# Patient Record
Sex: Female | Born: 1970 | Race: Black or African American | Hispanic: No | Marital: Single | Smoking: Never smoker
Health system: Southern US, Community
[De-identification: ages and names within clinical notes are randomized; demographics above are authoritative.]

## PROBLEM LIST (undated history)

## (undated) DIAGNOSIS — S069X9A Unspecified intracranial injury with loss of consciousness of unspecified duration, initial encounter: Secondary | ICD-10-CM

## (undated) DIAGNOSIS — G40909 Epilepsy, unspecified, not intractable, without status epilepticus: Secondary | ICD-10-CM

## (undated) DIAGNOSIS — I5043 Acute on chronic combined systolic (congestive) and diastolic (congestive) heart failure: Secondary | ICD-10-CM

## (undated) DIAGNOSIS — A419 Sepsis, unspecified organism: Secondary | ICD-10-CM

## (undated) DIAGNOSIS — E039 Hypothyroidism, unspecified: Secondary | ICD-10-CM

## (undated) DIAGNOSIS — S069XAA Unspecified intracranial injury with loss of consciousness status unknown, initial encounter: Secondary | ICD-10-CM

## (undated) DIAGNOSIS — J150 Pneumonia due to Klebsiella pneumoniae: Secondary | ICD-10-CM

## (undated) DIAGNOSIS — E119 Type 2 diabetes mellitus without complications: Secondary | ICD-10-CM

## (undated) DIAGNOSIS — J9621 Acute and chronic respiratory failure with hypoxia: Secondary | ICD-10-CM

## (undated) DIAGNOSIS — N170 Acute kidney failure with tubular necrosis: Secondary | ICD-10-CM

## (undated) DIAGNOSIS — J80 Acute respiratory distress syndrome: Secondary | ICD-10-CM

## (undated) DIAGNOSIS — Z87820 Personal history of traumatic brain injury: Secondary | ICD-10-CM

## (undated) DIAGNOSIS — Z930 Tracheostomy status: Secondary | ICD-10-CM

## (undated) DIAGNOSIS — R6520 Severe sepsis without septic shock: Secondary | ICD-10-CM

## (undated) DIAGNOSIS — J690 Pneumonitis due to inhalation of food and vomit: Secondary | ICD-10-CM

## (undated) HISTORY — PX: TRACHEOSTOMY: SUR1362

---

## 2011-01-26 ENCOUNTER — Ambulatory Visit (HOSPITAL_COMMUNITY): Payer: Self-pay | Admitting: Psychology

## 2011-02-01 ENCOUNTER — Ambulatory Visit (HOSPITAL_COMMUNITY): Payer: Self-pay | Admitting: Licensed Clinical Social Worker

## 2011-02-15 ENCOUNTER — Ambulatory Visit (HOSPITAL_COMMUNITY): Payer: Medicare Other | Admitting: Licensed Clinical Social Worker

## 2012-10-19 DIAGNOSIS — K315 Obstruction of duodenum: Secondary | ICD-10-CM | POA: Insufficient documentation

## 2012-10-19 DIAGNOSIS — R4182 Altered mental status, unspecified: Secondary | ICD-10-CM | POA: Insufficient documentation

## 2012-10-20 DIAGNOSIS — D62 Acute posthemorrhagic anemia: Secondary | ICD-10-CM | POA: Insufficient documentation

## 2012-10-20 DIAGNOSIS — D72819 Decreased white blood cell count, unspecified: Secondary | ICD-10-CM | POA: Insufficient documentation

## 2012-10-23 DIAGNOSIS — I469 Cardiac arrest, cause unspecified: Secondary | ICD-10-CM | POA: Insufficient documentation

## 2012-10-23 DIAGNOSIS — R0603 Acute respiratory distress: Secondary | ICD-10-CM | POA: Insufficient documentation

## 2018-01-18 DIAGNOSIS — R339 Retention of urine, unspecified: Secondary | ICD-10-CM | POA: Insufficient documentation

## 2018-01-18 DIAGNOSIS — R310 Gross hematuria: Secondary | ICD-10-CM | POA: Insufficient documentation

## 2018-05-20 DIAGNOSIS — D649 Anemia, unspecified: Secondary | ICD-10-CM | POA: Insufficient documentation

## 2018-05-20 DIAGNOSIS — D509 Iron deficiency anemia, unspecified: Secondary | ICD-10-CM | POA: Insufficient documentation

## 2018-06-04 ENCOUNTER — Inpatient Hospital Stay
Admission: AC | Admit: 2018-06-04 | Discharge: 2018-06-22 | Disposition: A | Discharge status: Skilled Nursing Facility | Payer: Medicare Other | Source: Other Acute Inpatient Hospital | Attending: Internal Medicine | Admitting: Internal Medicine

## 2018-06-04 ENCOUNTER — Other Ambulatory Visit (HOSPITAL_COMMUNITY): Payer: Medicare Other

## 2018-06-04 DIAGNOSIS — J9621 Acute and chronic respiratory failure with hypoxia: Secondary | ICD-10-CM

## 2018-06-04 DIAGNOSIS — T85598A Other mechanical complication of other gastrointestinal prosthetic devices, implants and grafts, initial encounter: Secondary | ICD-10-CM

## 2018-06-04 DIAGNOSIS — J80 Acute respiratory distress syndrome: Secondary | ICD-10-CM

## 2018-06-04 DIAGNOSIS — Z0189 Encounter for other specified special examinations: Secondary | ICD-10-CM

## 2018-06-04 DIAGNOSIS — Z4659 Encounter for fitting and adjustment of other gastrointestinal appliance and device: Secondary | ICD-10-CM

## 2018-06-04 DIAGNOSIS — J189 Pneumonia, unspecified organism: Secondary | ICD-10-CM

## 2018-06-04 DIAGNOSIS — J150 Pneumonia due to Klebsiella pneumoniae: Secondary | ICD-10-CM

## 2018-06-04 DIAGNOSIS — Z930 Tracheostomy status: Secondary | ICD-10-CM

## 2018-06-04 DIAGNOSIS — I5043 Acute on chronic combined systolic (congestive) and diastolic (congestive) heart failure: Secondary | ICD-10-CM

## 2018-06-04 DIAGNOSIS — A419 Sepsis, unspecified organism: Secondary | ICD-10-CM

## 2018-06-04 DIAGNOSIS — R6520 Severe sepsis without septic shock: Secondary | ICD-10-CM

## 2018-06-04 HISTORY — DX: Epilepsy, unspecified, not intractable, without status epilepticus: G40.909

## 2018-06-04 HISTORY — DX: Tracheostomy status: Z93.0

## 2018-06-04 HISTORY — DX: Acute respiratory distress syndrome: J80

## 2018-06-04 HISTORY — DX: Pneumonia due to Klebsiella pneumoniae: J15.0

## 2018-06-04 HISTORY — DX: Sepsis, unspecified organism: A41.9

## 2018-06-04 HISTORY — DX: Acute on chronic combined systolic (congestive) and diastolic (congestive) heart failure: I50.43

## 2018-06-04 HISTORY — DX: Severe sepsis without septic shock: R65.20

## 2018-06-04 HISTORY — DX: Type 2 diabetes mellitus without complications: E11.9

## 2018-06-04 HISTORY — DX: Acute and chronic respiratory failure with hypoxia: J96.21

## 2018-06-04 HISTORY — DX: Hypothyroidism, unspecified: E03.9

## 2018-06-04 HISTORY — DX: Personal history of traumatic brain injury: Z87.820

## 2018-06-05 ENCOUNTER — Institutional Professional Consult (permissible substitution) (HOSPITAL_COMMUNITY): Payer: Medicare Other

## 2018-06-05 ENCOUNTER — Other Ambulatory Visit (HOSPITAL_COMMUNITY): Payer: Medicare Other

## 2018-06-05 DIAGNOSIS — J150 Pneumonia due to Klebsiella pneumoniae: Secondary | ICD-10-CM | POA: Diagnosis not present

## 2018-06-05 DIAGNOSIS — Z930 Tracheostomy status: Secondary | ICD-10-CM

## 2018-06-05 DIAGNOSIS — J80 Acute respiratory distress syndrome: Secondary | ICD-10-CM

## 2018-06-05 DIAGNOSIS — J9621 Acute and chronic respiratory failure with hypoxia: Secondary | ICD-10-CM | POA: Diagnosis not present

## 2018-06-05 DIAGNOSIS — A419 Sepsis, unspecified organism: Secondary | ICD-10-CM

## 2018-06-05 DIAGNOSIS — I5043 Acute on chronic combined systolic (congestive) and diastolic (congestive) heart failure: Secondary | ICD-10-CM | POA: Diagnosis not present

## 2018-06-05 DIAGNOSIS — R6520 Severe sepsis without septic shock: Secondary | ICD-10-CM

## 2018-06-05 LAB — BASIC METABOLIC PANEL
ANION GAP: 9 (ref 5–15)
BUN: 28 mg/dL — AB (ref 6–20)
CHLORIDE: 102 mmol/L (ref 98–111)
CO2: 28 mmol/L (ref 22–32)
Calcium: 9.3 mg/dL (ref 8.9–10.3)
Creatinine, Ser: 0.54 mg/dL (ref 0.44–1.00)
Glucose, Bld: 117 mg/dL — ABNORMAL HIGH (ref 70–99)
POTASSIUM: 4.3 mmol/L (ref 3.5–5.1)
SODIUM: 139 mmol/L (ref 135–145)

## 2018-06-05 LAB — CBC WITH DIFFERENTIAL/PLATELET
Abs Immature Granulocytes: 0.1 10*3/uL (ref 0.0–0.1)
BASOS ABS: 0 10*3/uL (ref 0.0–0.1)
Basophils Relative: 0 %
EOS PCT: 2 %
Eosinophils Absolute: 0.2 10*3/uL (ref 0.0–0.7)
HCT: 33 % — ABNORMAL LOW (ref 36.0–46.0)
HEMOGLOBIN: 10.4 g/dL — AB (ref 12.0–15.0)
IMMATURE GRANULOCYTES: 1 %
LYMPHS ABS: 2 10*3/uL (ref 0.7–4.0)
LYMPHS PCT: 22 %
MCH: 28.5 pg (ref 26.0–34.0)
MCHC: 31.5 g/dL (ref 30.0–36.0)
MCV: 90.4 fL (ref 78.0–100.0)
Monocytes Absolute: 0.9 10*3/uL (ref 0.1–1.0)
Monocytes Relative: 9 %
NEUTROS ABS: 6.1 10*3/uL (ref 1.7–7.7)
NEUTROS PCT: 66 %
Platelets: 312 10*3/uL (ref 150–400)
RBC: 3.65 MIL/uL — AB (ref 3.87–5.11)
RDW: 14.4 % (ref 11.5–15.5)
WBC: 9.2 10*3/uL (ref 4.0–10.5)

## 2018-06-05 LAB — PROTIME-INR
INR: 1.1
PROTHROMBIN TIME: 14.1 s (ref 11.4–15.2)

## 2018-06-05 MED ORDER — BISACODYL 10 MG RE SUPP
10.00 | RECTAL | Status: DC
Start: 2018-06-05 — End: 2018-06-05

## 2018-06-05 MED ORDER — SODIUM CHLORIDE 0.9 % IV SOLN
10.00 | INTRAVENOUS | Status: DC
Start: ? — End: 2018-06-05

## 2018-06-05 MED ORDER — GENERIC EXTERNAL MEDICATION
10.00 | Status: DC
Start: ? — End: 2018-06-05

## 2018-06-05 MED ORDER — FAMOTIDINE 20 MG PO TABS
20.00 | ORAL_TABLET | ORAL | Status: DC
Start: 2018-06-04 — End: 2018-06-05

## 2018-06-05 MED ORDER — HEPARIN SODIUM (PORCINE) 5000 UNIT/ML IJ SOLN
5000.00 | INTRAMUSCULAR | Status: DC
Start: 2018-06-04 — End: 2018-06-05

## 2018-06-05 MED ORDER — HYDROCODONE-ACETAMINOPHEN 10-325 MG PO TABS
1.00 | ORAL_TABLET | ORAL | Status: DC
Start: ? — End: 2018-06-05

## 2018-06-05 MED ORDER — ASPIRIN 81 MG PO CHEW
81.00 | CHEWABLE_TABLET | ORAL | Status: DC
Start: 2018-06-05 — End: 2018-06-05

## 2018-06-05 MED ORDER — CARBAMAZEPINE 100 MG PO CHEW
100.00 | CHEWABLE_TABLET | ORAL | Status: DC
Start: 2018-06-04 — End: 2018-06-05

## 2018-06-05 MED ORDER — ALPRAZOLAM 0.25 MG PO TABS
0.25 | ORAL_TABLET | ORAL | Status: DC
Start: ? — End: 2018-06-05

## 2018-06-05 MED ORDER — NORTRIPTYLINE HCL 10 MG/5ML PO SOLN
25.00 | ORAL | Status: DC
Start: 2018-06-04 — End: 2018-06-05

## 2018-06-05 MED ORDER — BACLOFEN 10 MG PO TABS
10.00 | ORAL_TABLET | ORAL | Status: DC
Start: 2018-06-04 — End: 2018-06-05

## 2018-06-05 NOTE — Progress Notes (Signed)
[facility]  Date of Service: 06/05/2018  PULMONARY CONSULT   Joanne [name]  WUJ:[id number]  DOB: [birth year]   DOA: 06/04/2018  Referring Physician: Carron CurieAli Hijazi, MD  HPI: Joanne [name] is a 47 y.o. female seen for follow up of Acute on Chronic Respiratory Failure.  Patient is here for further management and weaning.  She is transferred with a history of a pneumonia which was bilateral related to Klebsiella pneumonia.  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  She also had complications related to history of gastric perforation with partial gastrectomy and then development of abdominal abscesses requiring multiple or surgeries.  She is a resident of a nursing facility presented to the hospital because of increasing shortness of breath and also was febrile to 103.  When she presented to the hospital with chest x-ray was done which showed diffuse pulmonary infiltrates lactate of 5 and a white count of 2.4.  Patient was started on vancomycin and Zosyn.  It was felt that she had ARDS and so she was intubated.  She was treated aggressively with antibiotics as well as steroids and also was diuresed.  She had initially been on 100% FiO2 which was eventually weaned down to 50%.  She presents to us now on 28% oxygen.  She had also been on pressors which were eventually weaned.  It appears that by the first week of August she started to show some improvement was started on T collar trials and she was actually tolerating those fairly well.  Prior to being transferred to our facility she was on continuous T collar's.  She had been more awake when she was transferred.  She had multiple procedures including bronchoscopy for her pulmonary status  Review of Systems:  ROS performed and is unremarkable other than noted above.  Past Medical History:  Diagnosis Date  . Acute on chronic  combined systolic and diastolic CHF (congestive heart failure) (HCC) 06/06/2018  . Acute on chronic respiratory failure with hypoxia (HCC) 06/06/2018  . ARDS (adult respiratory distress syndrome) (HCC) 06/06/2018  . Diabetes mellitus (HCC)   . History of traumatic brain injury   . Hypothyroidism   . Pneumonia due to Klebsiella pneumoniae (HCC) 06/06/2018  . Seizure disorder (HCC)   . Severe sepsis (HCC) 06/06/2018  . 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    Past Surgical History:  Procedure Laterality Date  . TRACHEOSTOMY      Social History:    has an unknown smoking status. She has never used smokeless tobacco. She reports that she drank alcohol. She reports that she has current or past drug history.  Family History: Non-Contributory to the present illness  Allergies Kaopectate thiopental tomatoes  Medications: Reviewed on Rounds  Physical Exam:  Vitals: Temperature 97.9 pulse 74 respiratory rate 21 blood pressure 140/69 saturations 100%  Ventilator Settings currently is off the ventilator on aerosolized T collar 28% FiO2 with PMV  . General: Comfortable at this time . Eyes: Grossly normal lids, irises & conjunctiva . ENT: grossly tongue is normal . Neck: no obvious mass . Cardiovascular: S1-S2 normal no gallop or rub . Respiratory: No rhonchi noted . Abdomen: Soft nontender . Skin: no rash seen on limited exam . Musculoskeletal: not rigid . Psychiatric:unable to assess . Neurologic: no seizure no involuntary movements         Labs on Admission:  Basic Metabolic Panel: Recent Labs  Lab 06/05/18 0454  NA 139  K 4.3  CL 102  CO2 28  GLUCOSE 117*  BUN 28*  CREATININE 0.54  CALCIUM 9.3    Liver Function Tests: No results for input(s): AST, ALT, ALKPHOS, BILITOT, PROT, ALBUMIN in the last 168 hours. No results for input(s): LIPASE, AMYLASE in the last 168 hours. No results for input(s): AMMONIA in the last 168 hours.  CBC: Recent Labs  Lab 06/05/18 0454   WBC 9.2  NEUTROABS 6.1  HGB 10.4*  HCT 33.0*  MCV 90.4  PLT 312    Cardiac Enzymes: No results for input(s): CKTOTAL, CKMB, CKMBINDEX, TROPONINI in the last 168 hours.  BNP (last 3 results) No results for input(s): BNP in the last 8760 hours.  ProBNP (last 3 results) No results for input(s): PROBNP in the last 8760 hours.   Radiological Exams on Admission: Dg Chest Port 1 View  Result Date: 06/04/2018 CLINICAL DATA:  Pneumonia EXAM: PORTABLE CHEST 1 VIEW COMPARISON:  None. FINDINGS: Esophageal tube tip overlies distal esophagus, side port over mid to distal mediastinum. Streaky bibasilar opacity. Mild cardiomegaly. No pleural effusion or pneumothorax. Scoliosis of the spine. IMPRESSION: 1. Esophageal tube tip and side-port project over the mid to distal esophagus, further advancement recommended for more optimal positioning. 2. Cardiomegaly. Low lung volumes with streaky bibasilar opacity right greater than left suggestive of atelectasis. Electronically Signed   By: Jasmine PangKim  Fujinaga M.D.   On: 06/04/2018 17:53   Dg Abd Portable 1v  Result Date: 06/05/2018 CLINICAL DATA:  NG tube placement. EXAM: PORTABLE ABDOMEN - 1 VIEW COMPARISON:  06/05/2018 FINDINGS: An NG tube is now noted with tip overlying the distal stomach. No other significant changes identified. IMPRESSION: NG tube with tip overlying the distal stomach. Electronically Signed   By: Harmon PierJeffrey  Hu M.D.   On: 06/05/2018 08:09   Dg Abd Portable 1v  Result Date: 06/05/2018 CLINICAL DATA:  NG tube placement EXAM: PORTABLE ABDOMEN - 1 VIEW COMPARISON:  06/04/2018 FINDINGS: Enteric tube tip remains at or near the level of the EG junction. Proximal side hole projects over the distal esophagus. Thoracolumbar scoliosis convex towards the left. Gas-filled large and small bowel without distention, likely ileus. Surgical clips in the right upper quadrant. Postoperative change in the left hip. IMPRESSION: Enteric tube tip remains at or near the  level of the EG junction. Proximal side hole projects over the distal esophagus. Electronically Signed   By: Burman NievesWilliam  Stevens M.D.   On: 06/05/2018 01:29   Dg Abd Portable 1v  Result Date: 06/04/2018 CLINICAL DATA:  Check nasogastric catheter placement EXAM: PORTABLE ABDOMEN - 1 VIEW COMPARISON:  06/04/2018 FINDINGS: Scattered large and small bowel gas is noted. The nasogastric catheter is again noted likely within the distal esophagus short of the gastric lumen. This should be advanced several cm. IMPRESSION: Nasogastric catheter is again noted short within the esophagus. This should be advanced several cm. Electronically Signed   By: Alcide CleverMark  Lukens M.D.   On: 06/04/2018 23:31   Dg Abd Portable 1v  Result Date: 06/04/2018 CLINICAL DATA:  Initial evaluation for NG tube placement. EXAM: PORTABLE ABDOMEN - 1 VIEW COMPARISON:  Prior radiograph from 0 same day. FINDINGS: Enteric tube seen overlying the lower chest, tip at or just above the GE junction, side hole in the distal esophagus. Consider advancement by approximately 5 cm to insure adequate placement within the stomach. Visualized bowel gas pattern nonobstructive. Remainder of the abdomen unchanged. IMPRESSION: Tip of NG tube at or  just above the GE junction, with side hole in the distal esophagus. Considered veins mint by 5 cm to insure adequate placement within the stomach. Electronically Signed   By: Rise Mu M.D.   On: 06/04/2018 17:54   Dg Abd Portable 1v  Result Date: 06/04/2018 CLINICAL DATA:  Initial evaluation for NG tube placement. EXAM: PORTABLE ABDOMEN - 1 VIEW COMPARISON:  None. FINDINGS: No enteric tube seen overlying the stomach or elsewhere within the abdomen. Bowel gas pattern within normal limits without evidence for obstruction. Suture material present within the right abdomen. Cholecystectomy clips noted. Thoracolumbar scoliosis with severe multilevel degenerative spondylolysis noted. Prior ORIF at the left hip noted.  IMPRESSION: 1. No enteric to visualized within the abdomen. Query malpositioning. 2. Nonobstructive bowel gas pattern. Electronically Signed   By: Rise Mu M.D.   On: 06/04/2018 17:52    Assessment/Plan Principal Problem:   Acute on chronic respiratory failure with hypoxia (HCC) Active Problems:   Tracheostomy status (HCC)   Pneumonia due to Klebsiella pneumoniae (HCC)   Acute on chronic combined systolic and diastolic CHF (congestive heart failure) (HCC)   Severe sepsis (HCC)   ARDS (adult respiratory distress syndrome) (HCC)   1. Acute on chronic respiratory failure with hypoxia patient is currently weaning on T collar will continue.  Patient is also got the PMV on place and will continue with the PMV trials as tolerated.  Patient's family and caregiver were in the room they were updated 2. Pneumonia due to crib Klebsiella she was treated doing better.  We will continue to follow-up on her x-rays. 3. Severe sepsis with shock hemodynamically stable continue present management continue to monitor pressures. 4. Acute on chronic combined systolic diastolic heart failure stable right now monitor fluid status continue present management. 5. ARDS resolved 6. Tracheostomy status she is on a wean protocol  I have personally seen and evaluated the patient, evaluated laboratory and imaging results, formulated the assessment and plan and placed orders. The Patient requires high complexity decision making for assessment and support.  Case was discussed on Rounds with the Respiratory Therapy Staff Time Spent  Yevonne Pax, MD [facility] Pulmonary Critical Care Medicine Sleep Medicine

## 2018-06-06 ENCOUNTER — Other Ambulatory Visit (HOSPITAL_COMMUNITY): Payer: Medicare Other

## 2018-06-06 ENCOUNTER — Encounter: Payer: Self-pay | Admitting: Internal Medicine

## 2018-06-06 DIAGNOSIS — J80 Acute respiratory distress syndrome: Secondary | ICD-10-CM | POA: Diagnosis not present

## 2018-06-06 DIAGNOSIS — R6520 Severe sepsis without septic shock: Secondary | ICD-10-CM

## 2018-06-06 DIAGNOSIS — Z930 Tracheostomy status: Secondary | ICD-10-CM

## 2018-06-06 DIAGNOSIS — N390 Urinary tract infection, site not specified: Secondary | ICD-10-CM

## 2018-06-06 DIAGNOSIS — J9621 Acute and chronic respiratory failure with hypoxia: Secondary | ICD-10-CM

## 2018-06-06 DIAGNOSIS — J150 Pneumonia due to Klebsiella pneumoniae: Secondary | ICD-10-CM

## 2018-06-06 DIAGNOSIS — I5043 Acute on chronic combined systolic (congestive) and diastolic (congestive) heart failure: Secondary | ICD-10-CM | POA: Diagnosis not present

## 2018-06-06 DIAGNOSIS — J9601 Acute respiratory failure with hypoxia: Secondary | ICD-10-CM | POA: Diagnosis present

## 2018-06-06 DIAGNOSIS — A419 Sepsis, unspecified organism: Secondary | ICD-10-CM

## 2018-06-06 HISTORY — DX: Acute on chronic combined systolic (congestive) and diastolic (congestive) heart failure: I50.43

## 2018-06-06 HISTORY — DX: Acute and chronic respiratory failure with hypoxia: J96.21

## 2018-06-06 HISTORY — DX: Pneumonia due to Klebsiella pneumoniae: J15.0

## 2018-06-06 HISTORY — DX: Tracheostomy status: Z93.0

## 2018-06-06 HISTORY — DX: Acute respiratory distress syndrome: J80

## 2018-06-06 HISTORY — DX: Severe sepsis without septic shock: R65.20

## 2018-06-06 HISTORY — DX: Sepsis, unspecified organism: A41.9

## 2018-06-06 NOTE — Progress Notes (Signed)
Pulmonary Critical Care Medicine [facility] [place]   PULMONARY SERVICE  PROGRESS NOTE  Date of Service: 06/06/2018  Joanne [name]  ZOX:[id number]  DOB: [birth year]   DOA: 06/04/2018  Referring Physician: Carron Curie, MD  HPI: Joanne [name] is a 47 y.o. female seen for follow up of Acute on Chronic Respiratory Failure.  She looks good today she is been on the T collar has been on 28% FiO2.  Also was started on the PMV which she was tolerating.  Secretions are somewhat increased and will get a need to watch these closely  Medications: Reviewed on Rounds  Physical Exam:  Vitals: Temperature 96.9 pulse 86 respiratory rate 12 blood pressure 102/56 saturations 100%  Ventilator Settings off the ventilator on T collar FiO2 28%  . General: Comfortable at this time . Eyes: Grossly normal lids, irises & conjunctiva . ENT: grossly tongue is normal . Neck: no obvious mass . Cardiovascular: S1 S2 normal no gallop . Respiratory: No rhonchi or rales are noted at this time . Abdomen: soft . Skin: no rash seen on limited exam . Musculoskeletal: not rigid . Psychiatric:unable to assess . Neurologic: no seizure no involuntary movements         Lab Data:   Basic Metabolic Panel: Recent Labs  Lab 06/05/18 0454  NA 139  K 4.3  CL 102  CO2 28  GLUCOSE 117*  BUN 28*  CREATININE 0.54  CALCIUM 9.3    Liver Function Tests: No results for input(s): AST, ALT, ALKPHOS, BILITOT, PROT, ALBUMIN in the last 168 hours. No results for input(s): LIPASE, AMYLASE in the last 168 hours. No results for input(s): AMMONIA in the last 168 hours.  CBC: Recent Labs  Lab 06/05/18 0454  WBC 9.2  NEUTROABS 6.1  HGB 10.4*  HCT 33.0*  MCV 90.4  PLT 312    Cardiac Enzymes: No results for input(s): CKTOTAL, CKMB, CKMBINDEX, TROPONINI in the last 168 hours.  BNP (last 3 results) No results for input(s): BNP in the last 8760 hours.  ProBNP (last 3 results) No results for input(s):  PROBNP in the last 8760 hours.  Radiological Exams: Dg Chest Port 1 View  Result Date: 06/06/2018 CLINICAL DATA:  Pneumonia EXAM: PORTABLE CHEST 1 VIEW COMPARISON:  06/04/2018 FINDINGS: Mild cardiomegaly. Streaky bibasilar densities slightly improved since prior study, likely improving atelectasis. No confluent opacities otherwise. No effusions or acute bony abnormality thoracolumbar scoliosis noted. NG tube has been advanced into the stomach. IMPRESSION: Minimal bibasilar atelectasis, improved since prior study. Electronically Signed   By: Charlett Nose M.D.   On: 06/06/2018 13:10   Dg Chest Port 1 View  Result Date: 06/04/2018 CLINICAL DATA:  Pneumonia EXAM: PORTABLE CHEST 1 VIEW COMPARISON:  None. FINDINGS: Esophageal tube tip overlies distal esophagus, side port over mid to distal mediastinum. Streaky bibasilar opacity. Mild cardiomegaly. No pleural effusion or pneumothorax. Scoliosis of the spine. IMPRESSION: 1. Esophageal tube tip and side-port project over the mid to distal esophagus, further advancement recommended for more optimal positioning. 2. Cardiomegaly. Low lung volumes with streaky bibasilar opacity right greater than left suggestive of atelectasis. Electronically Signed   By: Jasmine Pang M.D.   On: 06/04/2018 17:53   Dg Abd Portable 1v  Result Date: 06/05/2018 CLINICAL DATA:  Re-placement of nasogastric catheter EXAM: PORTABLE ABDOMEN - 1 VIEW COMPARISON:  Earlier film of the same day FINDINGS: Nasogastric tube has been replaced with its tip overlying the distal stomach. Regional surgical staple lines are noted. Visualized  bowel gas pattern unremarkable. IMPRESSION: Nasogastric tube tip in the region of the distal stomach. Electronically Signed   By: Corlis Leak  Hassell M.D.   On: 06/05/2018 19:19   Dg Abd Portable 1v  Result Date: 06/05/2018 CLINICAL DATA:  NG tube placement. EXAM: PORTABLE ABDOMEN - 1 VIEW COMPARISON:  06/05/2018 FINDINGS: An NG tube is now noted with tip overlying the  distal stomach. No other significant changes identified. IMPRESSION: NG tube with tip overlying the distal stomach. Electronically Signed   By: Harmon PierJeffrey  Hu M.D.   On: 06/05/2018 08:09   Dg Abd Portable 1v  Result Date: 06/05/2018 CLINICAL DATA:  NG tube placement EXAM: PORTABLE ABDOMEN - 1 VIEW COMPARISON:  06/04/2018 FINDINGS: Enteric tube tip remains at or near the level of the EG junction. Proximal side hole projects over the distal esophagus. Thoracolumbar scoliosis convex towards the left. Gas-filled large and small bowel without distention, likely ileus. Surgical clips in the right upper quadrant. Postoperative change in the left hip. IMPRESSION: Enteric tube tip remains at or near the level of the EG junction. Proximal side hole projects over the distal esophagus. Electronically Signed   By: Burman NievesWilliam  Stevens M.D.   On: 06/05/2018 01:29   Dg Abd Portable 1v  Result Date: 06/04/2018 CLINICAL DATA:  Check nasogastric catheter placement EXAM: PORTABLE ABDOMEN - 1 VIEW COMPARISON:  06/04/2018 FINDINGS: Scattered large and small bowel gas is noted. The nasogastric catheter is again noted likely within the distal esophagus short of the gastric lumen. This should be advanced several cm. IMPRESSION: Nasogastric catheter is again noted short within the esophagus. This should be advanced several cm. Electronically Signed   By: Alcide CleverMark  Lukens M.D.   On: 06/04/2018 23:31   Dg Abd Portable 1v  Result Date: 06/04/2018 CLINICAL DATA:  Initial evaluation for NG tube placement. EXAM: PORTABLE ABDOMEN - 1 VIEW COMPARISON:  Prior radiograph from 0 same day. FINDINGS: Enteric tube seen overlying the lower chest, tip at or just above the GE junction, side hole in the distal esophagus. Consider advancement by approximately 5 cm to insure adequate placement within the stomach. Visualized bowel gas pattern nonobstructive. Remainder of the abdomen unchanged. IMPRESSION: Tip of NG tube at or just above the GE junction, with  side hole in the distal esophagus. Considered veins mint by 5 cm to insure adequate placement within the stomach. Electronically Signed   By: Rise MuBenjamin  McClintock M.D.   On: 06/04/2018 17:54   Dg Abd Portable 1v  Result Date: 06/04/2018 CLINICAL DATA:  Initial evaluation for NG tube placement. EXAM: PORTABLE ABDOMEN - 1 VIEW COMPARISON:  None. FINDINGS: No enteric tube seen overlying the stomach or elsewhere within the abdomen. Bowel gas pattern within normal limits without evidence for obstruction. Suture material present within the right abdomen. Cholecystectomy clips noted. Thoracolumbar scoliosis with severe multilevel degenerative spondylolysis noted. Prior ORIF at the left hip noted. IMPRESSION: 1. No enteric to visualized within the abdomen. Query malpositioning. 2. Nonobstructive bowel gas pattern. Electronically Signed   By: Rise MuBenjamin  McClintock M.D.   On: 06/04/2018 17:52    Assessment/Plan Principal Problem:   Acute on chronic respiratory failure with hypoxia (HCC) Active Problems:   Tracheostomy status (HCC)   Pneumonia due to Klebsiella pneumoniae (HCC)   Acute on chronic combined systolic and diastolic CHF (congestive heart failure) (HCC)   Severe sepsis (HCC)   ARDS (adult respiratory distress syndrome) (HCC)   1. Acute on chronic respiratory failure with hypoxia we will continue with T collar weans.  Continue aggressive pulmonary toilet advance the PMV as tolerated. 2. Tracheostomy doing well advance PMV as tolerated 3. Pneumonia due to Klebsiella treated appears to be stable chest x-ray shows minimal basilar infiltrates which appears to be improved. 4. Acute on chronic combined systolic heart failure compensated at this time. 5. ARDS clinically resolved 6. Severe sepsis hemodynamically stable we will continue to monitor   I have personally seen and evaluated the patient, evaluated laboratory and imaging results, formulated the assessment and plan and placed orders. The  Patient requires high complexity decision making for assessment and support.  Case was discussed on Rounds with the Respiratory Therapy Staff  Yevonne PaxSaadat A Khan, MD [facility] Pulmonary Critical Care Medicine Sleep Medicine

## 2018-06-07 DIAGNOSIS — J80 Acute respiratory distress syndrome: Secondary | ICD-10-CM | POA: Diagnosis not present

## 2018-06-07 DIAGNOSIS — J150 Pneumonia due to Klebsiella pneumoniae: Secondary | ICD-10-CM | POA: Diagnosis not present

## 2018-06-07 DIAGNOSIS — J9621 Acute and chronic respiratory failure with hypoxia: Secondary | ICD-10-CM | POA: Diagnosis not present

## 2018-06-07 DIAGNOSIS — I5043 Acute on chronic combined systolic (congestive) and diastolic (congestive) heart failure: Secondary | ICD-10-CM | POA: Diagnosis not present

## 2018-06-07 NOTE — Progress Notes (Signed)
[facility] [place]   PULMONARY SERVICE  PROGRESS NOTE  Date of Service: 06/07/2018  Joanne NurseJamilia L [name]  WGN:[id number]  DOB: [birth year]   DOA: 06/04/2018  Referring Physician: Carron CurieAli Hijazi, MD  HPI: Joanne [name] is a 47 y.o. female seen for follow up of Acute on Chronic Respiratory Failure.  Patient is on T collar with the PMV in place has been tolerating it well  Medications: Reviewed on Rounds  Physical Exam:  Vitals: Temperature 98.3 pulse 76 respiratory rate 23 blood pressure 94/60 saturations 100%  Ventilator Settings on T collar FiO2 28%  . General: Comfortable at this time . Eyes: Grossly normal lids, irises & conjunctiva . ENT: grossly tongue is normal . Neck: no obvious mass . Cardiovascular: S1 S2 normal no gallop . Respiratory: No rhonchi no rales . Abdomen: soft . Skin: no rash seen on limited exam . Musculoskeletal: not rigid . Psychiatric:unable to assess . Neurologic: no seizure no involuntary movements         Lab Data:   Basic Metabolic Panel: Recent Labs  Lab 06/05/18 0454  NA 139  K 4.3  CL 102  CO2 28  GLUCOSE 117*  BUN 28*  CREATININE 0.54  CALCIUM 9.3    Liver Function Tests: No results for input(s): AST, ALT, ALKPHOS, BILITOT, PROT, ALBUMIN in the last 168 hours. No results for input(s): LIPASE, AMYLASE in the last 168 hours. No results for input(s): AMMONIA in the last 168 hours.  CBC: Recent Labs  Lab 06/05/18 0454  WBC 9.2  NEUTROABS 6.1  HGB 10.4*  HCT 33.0*  MCV 90.4  PLT 312    Cardiac Enzymes: No results for input(s): CKTOTAL, CKMB, CKMBINDEX, TROPONINI in the last 168 hours.  BNP (last 3 results) No results for input(s): BNP in the last 8760 hours.  ProBNP (last 3 results) No results for input(s): PROBNP in the last 8760 hours.  Radiological Exams: Dg Chest Port 1 View  Result Date: 06/06/2018 CLINICAL DATA:  Pneumonia EXAM: PORTABLE CHEST 1 VIEW COMPARISON:   06/04/2018 FINDINGS: Mild cardiomegaly. Streaky bibasilar densities slightly improved since prior study, likely improving atelectasis. No confluent opacities otherwise. No effusions or acute bony abnormality thoracolumbar scoliosis noted. NG tube has been advanced into the stomach. IMPRESSION: Minimal bibasilar atelectasis, improved since prior study. Electronically Signed   By: Charlett NoseKevin  Dover M.D.   On: 06/06/2018 13:10   Dg Abd Portable 1v  Result Date: 06/05/2018 CLINICAL DATA:  Re-placement of nasogastric catheter EXAM: PORTABLE ABDOMEN - 1 VIEW COMPARISON:  Earlier film of the same day FINDINGS: Nasogastric tube has been replaced with its tip overlying the distal stomach. Regional surgical staple lines are noted. Visualized bowel gas pattern unremarkable. IMPRESSION: Nasogastric tube tip in the region of the distal stomach. Electronically Signed   By: Corlis Leak  Hassell M.D.   On: 06/05/2018 19:19    Assessment/Plan Principal Problem:   Acute on chronic respiratory failure with hypoxia (HCC) Active Problems:   Tracheostomy status (HCC)   Pneumonia due to Klebsiella pneumoniae (HCC)   Acute on chronic combined systolic and diastolic CHF (congestive heart failure) (HCC)   Severe sepsis (HCC)   ARDS (adult respiratory distress syndrome) (HCC)   1. Acute on chronic respiratory failure with hypoxia continue weaning on T collar with PMV continue secretion management pulmonary toilet 2. ARDS clinically resolved we will continue present management 3. Severe sepsis hemodynamically stable 4. Acute on combined diastolic systolic dysfunction monitor fluid status.  We will  continue with supportive care. 5. Pneumonia due to Klebsiella treated resolved we will continue present management 6. Chronic tracheostomy in place   I have personally seen and evaluated the patient, evaluated laboratory and imaging results, formulated the assessment and plan and placed orders. The Patient requires high complexity  decision making for assessment and support.  Case was discussed on Rounds with the Respiratory Therapy Staff  Yevonne PaxSaadat A Khan, MD [facility] Pulmonary Critical Care Medicine Sleep Medicine

## 2018-06-09 DIAGNOSIS — I5043 Acute on chronic combined systolic (congestive) and diastolic (congestive) heart failure: Secondary | ICD-10-CM | POA: Diagnosis not present

## 2018-06-09 DIAGNOSIS — J9621 Acute and chronic respiratory failure with hypoxia: Secondary | ICD-10-CM | POA: Diagnosis not present

## 2018-06-09 DIAGNOSIS — J80 Acute respiratory distress syndrome: Secondary | ICD-10-CM | POA: Diagnosis not present

## 2018-06-09 DIAGNOSIS — J150 Pneumonia due to Klebsiella pneumoniae: Secondary | ICD-10-CM | POA: Diagnosis not present

## 2018-06-09 NOTE — Progress Notes (Signed)
[facility]   PULMONARY SERVICE  PROGRESS NOTE  Date of Service: 06/09/2018  Joanne [name]  ZOX:[id number]:[id number]  DOB: [birth year]   DOA: 06/04/2018  Referring Physician: Carron CurieAli Hijazi, MD  HPI: Joanne [name] is a 47 y.o. female seen for follow up of Acute on Chronic Respiratory Failure.  Currently is on T collar is doing well has been tolerating 28% FiO2  Medications: Reviewed on Rounds  Physical Exam:  Vitals: Pressure 97.8 pulse 79 respiratory rate 17 blood pressure 110/68 saturations 95%  Ventilator Settings off the ventilator on T collar  . General: Comfortable at this time . Eyes: Grossly normal lids, irises & conjunctiva . ENT: grossly tongue is normal . Neck: no obvious mass . Cardiovascular: S1 S2 normal no gallop . Respiratory: Coarse breath sounds no rhonchi . Abdomen: soft . Skin: no rash seen on limited exam . Musculoskeletal: not rigid . Psychiatric:unable to assess . Neurologic: no seizure no involuntary movements         Lab Data:   Basic Metabolic Panel: Recent Labs  Lab 06/05/18 0454  NA 139  K 4.3  CL 102  CO2 28  GLUCOSE 117*  BUN 28*  CREATININE 0.54  CALCIUM 9.3    Liver Function Tests: No results for input(s): AST, ALT, ALKPHOS, BILITOT, PROT, ALBUMIN in the last 168 hours. No results for input(s): LIPASE, AMYLASE in the last 168 hours. No results for input(s): AMMONIA in the last 168 hours.  CBC: Recent Labs  Lab 06/05/18 0454  WBC 9.2  NEUTROABS 6.1  HGB 10.4*  HCT 33.0*  MCV 90.4  PLT 312    Cardiac Enzymes: No results for input(s): CKTOTAL, CKMB, CKMBINDEX, TROPONINI in the last 168 hours.  BNP (last 3 results) No results for input(s): BNP in the last 8760 hours.  ProBNP (last 3 results) No results for input(s): PROBNP in the last 8760 hours.  Radiological Exams: No results found.  Assessment/Plan Principal Problem:   Acute on chronic respiratory failure with  hypoxia (HCC) Active Problems:   Tracheostomy status (HCC)   Pneumonia due to Klebsiella pneumoniae (HCC)   Acute on chronic combined systolic and diastolic CHF (congestive heart failure) (HCC)   Severe sepsis (HCC)   ARDS (adult respiratory distress syndrome) (HCC)   1. Acute on chronic respiratory failure with hypoxia continue with the T collar weaning as tolerated continue pulmonary toilet supportive care. 2. Tracheostomy will change the tracheostomy tube #6 cuffless at this time 3. Pneumonia due to Klebsiella treated improved 4. Severe sepsis resolved 5. ARDS treated resolved 6. Acute on chronic combined systolic diastolic heart failure clinically stable   I have personally seen and evaluated the patient, evaluated laboratory and imaging results, formulated the assessment and plan and placed orders. The Patient requires high complexity decision making for assessment and support.  Case was discussed on Rounds with the Respiratory Therapy Staff  Yevonne PaxSaadat A Khan, MD [facility]ala[facility]CC[facility] Pulmonary Critical Care Medicine Sleep Medicine

## 2018-06-10 DIAGNOSIS — J150 Pneumonia due to Klebsiella pneumoniae: Secondary | ICD-10-CM | POA: Diagnosis not present

## 2018-06-10 DIAGNOSIS — J9621 Acute and chronic respiratory failure with hypoxia: Secondary | ICD-10-CM | POA: Diagnosis not present

## 2018-06-10 DIAGNOSIS — I5043 Acute on chronic combined systolic (congestive) and diastolic (congestive) heart failure: Secondary | ICD-10-CM | POA: Diagnosis not present

## 2018-06-10 DIAGNOSIS — J80 Acute respiratory distress syndrome: Secondary | ICD-10-CM | POA: Diagnosis not present

## 2018-06-10 NOTE — Progress Notes (Signed)
[facility]   PULMONARY SERVICE  PROGRESS NOTE  Date of Service: 06/10/2018  Joanne [name]  ZOX:[id number]:[id number]  DOB: June 08, 1971   DOA: 06/04/2018  Referring Physician: Carron CurieAli Hijazi, MD  HPI: Joanne [name] is a 47 y.o. female seen for follow up of Acute on Chronic Respiratory Failure.  She is doing well has been capping no distress at this time the capping has been for 24 hours  Medications: Reviewed on Rounds  Physical Exam:  Vitals: Temperature 97.7 pulse 85 respiratory 27 blood pressure 125/77 saturations 98%  Ventilator Settings off the ventilator capping  . General: Comfortable at this time . Eyes: Grossly normal lids, irises & conjunctiva . ENT: grossly tongue is normal . Neck: no obvious mass . Cardiovascular: S1 S2 normal no gallop . Respiratory: No rhonchi no rales . Abdomen: soft . Skin: no rash seen on limited exam . Musculoskeletal: not rigid . Psychiatric:unable to assess . Neurologic: no seizure no involuntary movements         Lab Data:   Basic Metabolic Panel: Recent Labs  Lab 06/05/18 0454  NA 139  K 4.3  CL 102  CO2 28  GLUCOSE 117*  BUN 28*  CREATININE 0.54  CALCIUM 9.3    Liver Function Tests: No results for input(s): AST, ALT, ALKPHOS, BILITOT, PROT, ALBUMIN in the last 168 hours. No results for input(s): LIPASE, AMYLASE in the last 168 hours. No results for input(s): AMMONIA in the last 168 hours.  CBC: Recent Labs  Lab 06/05/18 0454  WBC 9.2  NEUTROABS 6.1  HGB 10.4*  HCT 33.0*  MCV 90.4  PLT 312    Cardiac Enzymes: No results for input(s): CKTOTAL, CKMB, CKMBINDEX, TROPONINI in the last 168 hours.  BNP (last 3 results) No results for input(s): BNP in the last 8760 hours.  ProBNP (last 3 results) No results for input(s): PROBNP in the last 8760 hours.  Radiological Exams: No results found.  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 Acute on chronic respiratory failure with hypoxia we will continue with advancing the capping as ordered. 2. Pneumonia due to Klebsiella we will continue with supportive care 3. Tracheostomy she is doing well with the capping we will continue 4. Severe sepsis hemodynamically stable 5. ARDS resolved continue with supportive care 6. Acute combined systolic diastolic heart failure we will continue to follow   I have personally seen and evaluated the patient, evaluated laboratory and imaging results, formulated the assessment and plan and placed orders. The Patient requires high complexity decision making for assessment and support.  Case was discussed on Rounds with the Respiratory Therapy Staff  Yevonne PaxSaadat A Wanda Cellucci, MD [facility]ittFCC[facility] Pulmonary Critical Care Medicine Sleep Medicine

## 2018-06-11 ENCOUNTER — Other Ambulatory Visit (HOSPITAL_COMMUNITY): Payer: Medicare Other

## 2018-06-11 DIAGNOSIS — J80 Acute respiratory distress syndrome: Secondary | ICD-10-CM | POA: Diagnosis not present

## 2018-06-11 DIAGNOSIS — J9621 Acute and chronic respiratory failure with hypoxia: Secondary | ICD-10-CM | POA: Diagnosis not present

## 2018-06-11 DIAGNOSIS — J150 Pneumonia due to Klebsiella pneumoniae: Secondary | ICD-10-CM | POA: Diagnosis not present

## 2018-06-11 DIAGNOSIS — I5043 Acute on chronic combined systolic (congestive) and diastolic (congestive) heart failure: Secondary | ICD-10-CM | POA: Diagnosis not present

## 2018-06-11 NOTE — Progress Notes (Signed)
Pulmonary Critical Care [facility]   PULMONARY SERVICE  PROGRESS NOTE  Date of Service: 06/11/2018  Joanne [name]  ZOX:[id number]:[id number]  DOB: 12/02/1970   DOA: 06/04/2018  Referring Physician: Carron CurieAli Hijazi, MD  HPI: Joanne [name] is a 47 y.o. female seen for follow up of Acute on Chronic Respiratory Failure.  Capping trials at this time no distress patient has been capped for more than 72 hours  Medications: Reviewed on Rounds  Physical Exam:  Vitals: Temperature 97.4 pulse 83 respiratory rate 25 blood pressure 9756 saturations 98%  Ventilator Settings capping  . General: Comfortable at this time . Eyes: Grossly normal lids, irises & conjunctiva . ENT: grossly tongue is normal . Neck: no obvious mass . Cardiovascular: S1 S2 normal no gallop . Respiratory: No rhonchi no rales . Abdomen: soft . Skin: no rash seen on limited exam . Musculoskeletal: not rigid . Psychiatric:unable to assess . Neurologic: no seizure no involuntary movements         Lab Data:   Basic Metabolic Panel: Recent Labs  Lab 06/05/18 0454  NA 139  K 4.3  CL 102  CO2 28  GLUCOSE 117*  BUN 28*  CREATININE 0.54  CALCIUM 9.3    Liver Function Tests: No results for input(s): AST, ALT, ALKPHOS, BILITOT, PROT, ALBUMIN in the last 168 hours. No results for input(s): LIPASE, AMYLASE in the last 168 hours. No results for input(s): AMMONIA in the last 168 hours.  CBC: Recent Labs  Lab 06/05/18 0454  WBC 9.2  NEUTROABS 6.1  HGB 10.4*  HCT 33.0*  MCV 90.4  PLT 312    Cardiac Enzymes: No results for input(s): CKTOTAL, CKMB, CKMBINDEX, TROPONINI in the last 168 hours.  BNP (last 3 results) No results for input(s): BNP in the last 8760 hours.  ProBNP (last 3 results) No results for input(s): PROBNP in the last 8760 hours.  Radiological Exams: No results found.  Assessment/Plan Principal Problem:   Acute on chronic respiratory failure with hypoxia  (HCC) Active Problems:   Tracheostomy status (HCC)   Pneumonia due to Klebsiella pneumoniae (HCC)   Acute on chronic combined systolic and diastolic CHF (congestive heart failure) (HCC)   Severe sepsis (HCC)   ARDS (adult respiratory distress syndrome) (HCC)   1. Acute on chronic respiratory failure with hypoxia continues to do well with capping we will advance 2. Pneumonia due to Klebsiella treated we will continue to follow clinically. 3. Severe sepsis hemodynamically stable 4. Acute on chronic systolic diastolic heart failure compensated 5. ARDS clinically resolved   I have personally seen and evaluated the patient, evaluated laboratory and imaging results, formulated the assessment and plan and placed orders. The Patient requires high complexity decision making for assessment and support.  Case was discussed on Rounds with the Respiratory Therapy Staff  Yevonne PaxSaadat A Khan, MD [facility] Pulmonary Critical Care Medicine Sleep Medicine

## 2018-06-12 DIAGNOSIS — I5043 Acute on chronic combined systolic (congestive) and diastolic (congestive) heart failure: Secondary | ICD-10-CM | POA: Diagnosis not present

## 2018-06-12 DIAGNOSIS — J80 Acute respiratory distress syndrome: Secondary | ICD-10-CM | POA: Diagnosis not present

## 2018-06-12 DIAGNOSIS — J150 Pneumonia due to Klebsiella pneumoniae: Secondary | ICD-10-CM | POA: Diagnosis not present

## 2018-06-12 DIAGNOSIS — J9621 Acute and chronic respiratory failure with hypoxia: Secondary | ICD-10-CM | POA: Diagnosis not present

## 2018-06-12 NOTE — Progress Notes (Signed)
[facility]   PULMONARY SERVICE  PROGRESS NOTE  Date of Service: 06/12/2018  Joanne [name]  ZOX:[id number]:[id number]  DOB: October 18, 1971   DOA: 06/04/2018  Referring Physician: Carron CurieAli Hijazi, MD  HPI: Joanne [name] is a 47 y.o. female seen for follow up of Acute on Chronic Respiratory Failure.  Patient is capping doing fairly well.  Has been capped for more than 72 hours secretions are minimal  Medications: Reviewed on Rounds  Physical Exam:  Vitals: Temperature 97.6 pulse 79 respiratory rate 16 blood pressure 106/66 saturations 98%  Ventilator Settings currently is off the ventilator doing well  . General: Comfortable at this time . Eyes: Grossly normal lids, irises & conjunctiva . ENT: grossly tongue is normal . Neck: no obvious mass . Cardiovascular: S1 S2 normal no gallop . Respiratory: No rhonchi no rales . Abdomen: soft . Skin: no rash seen on limited exam . Musculoskeletal: not rigid . Psychiatric:unable to assess . Neurologic: no seizure no involuntary movements         Lab Data:   Basic Metabolic Panel: No results for input(s): NA, K, CL, CO2, GLUCOSE, BUN, CREATININE, CALCIUM, MG, PHOS in the last 168 hours.  Liver Function Tests: No results for input(s): AST, ALT, ALKPHOS, BILITOT, PROT, ALBUMIN in the last 168 hours. No results for input(s): LIPASE, AMYLASE in the last 168 hours. No results for input(s): AMMONIA in the last 168 hours.  CBC: No results for input(s): WBC, NEUTROABS, HGB, HCT, MCV, PLT in the last 168 hours.  Cardiac Enzymes: No results for input(s): CKTOTAL, CKMB, CKMBINDEX, TROPONINI in the last 168 hours.  BNP (last 3 results) No results for input(s): BNP in the last 8760 hours.  ProBNP (last 3 results) No results for input(s): PROBNP in the last 8760 hours.  Radiological Exams: No results found.  Assessment/Plan Principal Problem:   Acute on chronic respiratory failure with hypoxia  (HCC) Active Problems:   Tracheostomy status (HCC)   Pneumonia due to Klebsiella pneumoniae (HCC)   Acute on chronic combined systolic and diastolic CHF (congestive heart failure) (HCC)   Severe sepsis (HCC)   ARDS (adult respiratory distress syndrome) (HCC)   1. Acute on chronic respiratory failure with hypoxia doing well capping 72 hours.  Please she should be able to progress to decannulation. 2. Tracheostomy status is to decannulate 3. Pneumonia treated with antibiotics clinically improved 4. Severe sepsis resolved 5. ARDS clinically resolved   I have personally seen and evaluated the patient, evaluated laboratory and imaging results, formulated the assessment and plan and placed orders. The Patient requires high complexity decision making for assessment and support.  Case was discussed on Rounds with the Respiratory Therapy Staff  Yevonne PaxSaadat A Tyyne Cliett, MD [facility] CountyFCCP Pulmonary Critical Care Medicine Sleep Medicine

## 2018-06-13 DIAGNOSIS — J80 Acute respiratory distress syndrome: Secondary | ICD-10-CM | POA: Diagnosis not present

## 2018-06-13 DIAGNOSIS — I5043 Acute on chronic combined systolic (congestive) and diastolic (congestive) heart failure: Secondary | ICD-10-CM | POA: Diagnosis not present

## 2018-06-13 DIAGNOSIS — J9621 Acute and chronic respiratory failure with hypoxia: Secondary | ICD-10-CM | POA: Diagnosis not present

## 2018-06-13 DIAGNOSIS — J150 Pneumonia due to Klebsiella pneumoniae: Secondary | ICD-10-CM | POA: Diagnosis not present

## 2018-06-13 NOTE — Progress Notes (Signed)
[facility]   PULMONARY SERVICE  PROGRESS NOTE  Date of Service: 06/13/2018  Joanne [name]  ZOX:[id number]:[id number]  DOB: 10/18/1971   DOA: 06/04/2018  Referring Physician: Carron CurieAli Hijazi, MD  HPI: Joanne [name] is a 47 y.o. female seen for follow up of Acute on Chronic Respiratory Failure.  She is doing well capping sitting up in chair she is able to decannulate  Medications: Reviewed on Rounds  Physical Exam:  Vitals: Temperature 97.0 pulse 87 respiratory rate 21 blood pressure 114/85 saturations 100%  Ventilator Settings capping  . General: Comfortable at this time . Eyes: Grossly normal lids, irises & conjunctiva . ENT: grossly tongue is normal . Neck: no obvious mass . Cardiovascular: S1 S2 normal no gallop . Respiratory: No rhonchi no rales . Abdomen: soft . Skin: no rash seen on limited exam . Musculoskeletal: not rigid . Psychiatric:unable to assess . Neurologic: no seizure no involuntary movements         Lab Data:   Basic Metabolic Panel: No results for input(s): NA, K, CL, CO2, GLUCOSE, BUN, CREATININE, CALCIUM, MG, PHOS in the last 168 hours.  Liver Function Tests: No results for input(s): AST, ALT, ALKPHOS, BILITOT, PROT, ALBUMIN in the last 168 hours. No results for input(s): LIPASE, AMYLASE in the last 168 hours. No results for input(s): AMMONIA in the last 168 hours.  CBC: No results for input(s): WBC, NEUTROABS, HGB, HCT, MCV, PLT in the last 168 hours.  Cardiac Enzymes: No results for input(s): CKTOTAL, CKMB, CKMBINDEX, TROPONINI in the last 168 hours.  BNP (last 3 results) No results for input(s): BNP in the last 8760 hours.  ProBNP (last 3 results) No results for input(s): PROBNP in the last 8760 hours.  Radiological Exams: No results found.  Assessment/Plan Principal Problem:   Acute on chronic respiratory failure with hypoxia (HCC) Active Problems:   Tracheostomy status (HCC)   Pneumonia  due to Klebsiella pneumoniae (HCC)   Acute on chronic combined systolic and diastolic CHF (congestive heart failure) (HCC)   Severe sepsis (HCC)   ARDS (adult respiratory distress syndrome) (HCC)   1. Acute on chronic respiratory failure with hypoxia we will continue with supportive care and proceed toward decannulated today. 2. The tracheostomy will go ahead and decannulate. 3. Pneumonia resolved clinically stable 4. Acute on chronic combined systolic diastolic heart failure compensated 5. Severe sepsis resolved 6. ARDS resolved   I have personally seen and evaluated the patient, evaluated laboratory and imaging results, formulated the assessment and plan and placed orders. The Patient requires high complexity decision making for assessment and support.  Case was discussed on Rounds with the Respiratory Therapy Staff  Yevonne PaxSaadat A Kamryn Gauthier, MD [facility]FCCP Pulmonary Critical Care Medicine Sleep Medicine

## 2018-06-14 DIAGNOSIS — I5043 Acute on chronic combined systolic (congestive) and diastolic (congestive) heart failure: Secondary | ICD-10-CM | POA: Diagnosis not present

## 2018-06-14 DIAGNOSIS — J150 Pneumonia due to Klebsiella pneumoniae: Secondary | ICD-10-CM | POA: Diagnosis not present

## 2018-06-14 DIAGNOSIS — J80 Acute respiratory distress syndrome: Secondary | ICD-10-CM | POA: Diagnosis not present

## 2018-06-14 DIAGNOSIS — J9621 Acute and chronic respiratory failure with hypoxia: Secondary | ICD-10-CM | POA: Diagnosis not present

## 2018-06-14 NOTE — Progress Notes (Signed)
[facility]ulmonary Critical [facility]   PULMONARY CRITICAL CARE SERVICE  PROGRESS NOTE  Date of Service: 06/14/2018  Joanne [name]  ZOX:[id number]:[id number]  DOB: 10-May-1971   DOA: 06/04/2018  Referring Physician: Carron CurieAli Hijazi, MD  HPI: Joanne [name] is a 47 y.o. female seen for follow up of Acute on Chronic Respiratory Failure.  She is doing well decannulated no distress.  No issues overnight  Medications: Reviewed on Rounds  Physical Exam:  Vitals: Temperature 98.0 pulse 80 respiratory rate 16 blood pressure 132/81 saturations 99%  Ventilator Settings off the ventilator  . General: Comfortable at this time . Eyes: Grossly normal lids, irises & conjunctiva . ENT: grossly tongue is normal . Neck: no obvious mass . Cardiovascular: S1 S2 normal no gallop . Respiratory: No rhonchi no rales . Abdomen: soft . Skin: no rash seen on limited exam . Musculoskeletal: not rigid . Psychiatric:unable to assess . Neurologic: no seizure no involuntary movements         Lab Data:   Basic Metabolic Panel: No results for input(s): NA, K, CL, CO2, GLUCOSE, BUN, CREATININE, CALCIUM, MG, PHOS in the last 168 hours.  Liver Function Tests: No results for input(s): AST, ALT, ALKPHOS, BILITOT, PROT, ALBUMIN in the last 168 hours. No results for input(s): LIPASE, AMYLASE in the last 168 hours. No results for input(s): AMMONIA in the last 168 hours.  CBC: No results for input(s): WBC, NEUTROABS, HGB, HCT, MCV, PLT in the last 168 hours.  Cardiac Enzymes: No results for input(s): CKTOTAL, CKMB, CKMBINDEX, TROPONINI in the last 168 hours.  BNP (last 3 results) No results for input(s): BNP in the last 8760 hours.  ProBNP (last 3 results) No results for input(s): PROBNP in the last 8760 hours.  Radiological Exams: No results found.  Assessment/Plan Principal Problem:   Acute on chronic respiratory failure with hypoxia (HCC) Active Problems:   Tracheostomy status  (HCC)   Pneumonia due to Klebsiella pneumoniae (HCC)   Acute on chronic combined systolic and diastolic CHF (congestive heart failure) (HCC)   Severe sepsis (HCC)   ARDS (adult respiratory distress syndrome) (HCC)   1. Acute on chronic respiratory failure with hypoxia clinically improved she is decannulated stoma is still not fully closed.  Continue to monitor. 2. Tracheostomy status post decannulation 3. Pneumonia treated clinically improved 4. Severe sepsis resolved 5. ARDS resolved 6. Acute on chronic systolic diastolic heart failure clinically improving continue with supportive care   I have personally seen and evaluated the patient, evaluated laboratory and imaging results, formulated the assessment and plan and placed orders. The Patient requires high complexity decision making for assessment and support.  Case was discussed on Rounds with the Respiratory Therapy Staff  Yevonne PaxSaadat A Khan, MD [facility] Pulmonary Critical Care Medicine Sleep Medicine

## 2018-06-17 LAB — CBC WITH DIFFERENTIAL/PLATELET
ABS IMMATURE GRANULOCYTES: 0.1 10*3/uL (ref 0.0–0.1)
BASOS ABS: 0 10*3/uL (ref 0.0–0.1)
BASOS PCT: 0 %
EOS ABS: 0.1 10*3/uL (ref 0.0–0.7)
Eosinophils Relative: 1 %
HCT: 35.6 % — ABNORMAL LOW (ref 36.0–46.0)
Hemoglobin: 10.9 g/dL — ABNORMAL LOW (ref 12.0–15.0)
Immature Granulocytes: 1 %
Lymphocytes Relative: 30 %
Lymphs Abs: 1.9 10*3/uL (ref 0.7–4.0)
MCH: 28.5 pg (ref 26.0–34.0)
MCHC: 30.6 g/dL (ref 30.0–36.0)
MCV: 93 fL (ref 78.0–100.0)
MONO ABS: 0.8 10*3/uL (ref 0.1–1.0)
Monocytes Relative: 12 %
NEUTROS ABS: 3.7 10*3/uL (ref 1.7–7.7)
NEUTROS PCT: 56 %
PLATELETS: 264 10*3/uL (ref 150–400)
RBC: 3.83 MIL/uL — ABNORMAL LOW (ref 3.87–5.11)
RDW: 15.2 % (ref 11.5–15.5)
WBC: 6.5 10*3/uL (ref 4.0–10.5)

## 2018-06-17 LAB — BASIC METABOLIC PANEL
ANION GAP: 8 (ref 5–15)
BUN: 22 mg/dL — ABNORMAL HIGH (ref 6–20)
CALCIUM: 9.2 mg/dL (ref 8.9–10.3)
CO2: 29 mmol/L (ref 22–32)
CREATININE: 0.68 mg/dL (ref 0.44–1.00)
Chloride: 105 mmol/L (ref 98–111)
GLUCOSE: 97 mg/dL (ref 70–99)
Potassium: 3.9 mmol/L (ref 3.5–5.1)
Sodium: 142 mmol/L (ref 135–145)

## 2019-08-05 DIAGNOSIS — R109 Unspecified abdominal pain: Secondary | ICD-10-CM | POA: Insufficient documentation

## 2019-08-05 DIAGNOSIS — R1110 Vomiting, unspecified: Secondary | ICD-10-CM | POA: Insufficient documentation

## 2019-09-10 ENCOUNTER — Institutional Professional Consult (permissible substitution) (HOSPITAL_COMMUNITY): Payer: Self-pay

## 2019-09-10 ENCOUNTER — Inpatient Hospital Stay
Admission: EM | Admit: 2019-09-10 | Discharge: 2019-10-16 | Disposition: A | Discharge status: Skilled Nursing Facility | Payer: Medicare Other | Source: Other Acute Inpatient Hospital | Attending: Internal Medicine | Admitting: Internal Medicine

## 2019-09-10 DIAGNOSIS — J9621 Acute and chronic respiratory failure with hypoxia: Secondary | ICD-10-CM | POA: Diagnosis present

## 2019-09-10 DIAGNOSIS — Z789 Other specified health status: Secondary | ICD-10-CM

## 2019-09-10 DIAGNOSIS — S069X9A Unspecified intracranial injury with loss of consciousness of unspecified duration, initial encounter: Secondary | ICD-10-CM | POA: Diagnosis present

## 2019-09-10 DIAGNOSIS — Z930 Tracheostomy status: Secondary | ICD-10-CM

## 2019-09-10 DIAGNOSIS — N170 Acute kidney failure with tubular necrosis: Secondary | ICD-10-CM | POA: Diagnosis present

## 2019-09-10 DIAGNOSIS — J690 Pneumonitis due to inhalation of food and vomit: Secondary | ICD-10-CM | POA: Diagnosis present

## 2019-09-10 DIAGNOSIS — S069XAA Unspecified intracranial injury with loss of consciousness status unknown, initial encounter: Secondary | ICD-10-CM | POA: Diagnosis present

## 2019-09-10 DIAGNOSIS — J189 Pneumonia, unspecified organism: Secondary | ICD-10-CM

## 2019-09-10 DIAGNOSIS — Z4659 Encounter for fitting and adjustment of other gastrointestinal appliance and device: Secondary | ICD-10-CM

## 2019-09-10 DIAGNOSIS — J9690 Respiratory failure, unspecified, unspecified whether with hypoxia or hypercapnia: Secondary | ICD-10-CM

## 2019-09-10 DIAGNOSIS — Z931 Gastrostomy status: Secondary | ICD-10-CM

## 2019-09-10 DIAGNOSIS — J9601 Acute respiratory failure with hypoxia: Secondary | ICD-10-CM | POA: Diagnosis present

## 2019-09-10 HISTORY — DX: Unspecified intracranial injury with loss of consciousness of unspecified duration, initial encounter: S06.9X9A

## 2019-09-10 HISTORY — DX: Unspecified intracranial injury with loss of consciousness status unknown, initial encounter: S06.9XAA

## 2019-09-10 HISTORY — DX: Acute kidney failure with tubular necrosis: N17.0

## 2019-09-10 HISTORY — DX: Pneumonitis due to inhalation of food and vomit: J69.0

## 2019-09-10 LAB — CBC WITH DIFFERENTIAL/PLATELET
Abs Immature Granulocytes: 0.03 10*3/uL (ref 0.00–0.07)
Basophils Absolute: 0 10*3/uL (ref 0.0–0.1)
Basophils Relative: 0 %
Eosinophils Absolute: 0.3 10*3/uL (ref 0.0–0.5)
Eosinophils Relative: 3 %
HCT: 29.6 % — ABNORMAL LOW (ref 36.0–46.0)
Hemoglobin: 9.1 g/dL — ABNORMAL LOW (ref 12.0–15.0)
Immature Granulocytes: 0 %
Lymphocytes Relative: 18 %
Lymphs Abs: 1.5 10*3/uL (ref 0.7–4.0)
MCH: 30 pg (ref 26.0–34.0)
MCHC: 30.7 g/dL (ref 30.0–36.0)
MCV: 97.7 fL (ref 80.0–100.0)
Monocytes Absolute: 1 10*3/uL (ref 0.1–1.0)
Monocytes Relative: 12 %
Neutro Abs: 5.6 10*3/uL (ref 1.7–7.7)
Neutrophils Relative %: 67 %
Platelets: 235 10*3/uL (ref 150–400)
RBC: 3.03 MIL/uL — ABNORMAL LOW (ref 3.87–5.11)
RDW: 13.2 % (ref 11.5–15.5)
WBC: 8.4 10*3/uL (ref 4.0–10.5)
nRBC: 0 % (ref 0.0–0.2)

## 2019-09-10 LAB — BASIC METABOLIC PANEL
Anion gap: 6 (ref 5–15)
BUN: 10 mg/dL (ref 6–20)
CO2: 25 mmol/L (ref 22–32)
Calcium: 8.6 mg/dL — ABNORMAL LOW (ref 8.9–10.3)
Chloride: 110 mmol/L (ref 98–111)
Creatinine, Ser: 1.45 mg/dL — ABNORMAL HIGH (ref 0.44–1.00)
GFR calc Af Amer: 49 mL/min — ABNORMAL LOW (ref >60)
GFR calc non Af Amer: 42 mL/min — ABNORMAL LOW (ref >60)
Glucose, Bld: 98 mg/dL (ref 70–99)
Potassium: 4.1 mmol/L (ref 3.5–5.1)
Sodium: 141 mmol/L (ref 135–145)

## 2019-09-11 ENCOUNTER — Other Ambulatory Visit (HOSPITAL_COMMUNITY): Payer: Self-pay

## 2019-09-11 ENCOUNTER — Encounter: Payer: Self-pay | Admitting: Internal Medicine

## 2019-09-11 DIAGNOSIS — S069XAA Unspecified intracranial injury with loss of consciousness status unknown, initial encounter: Secondary | ICD-10-CM | POA: Diagnosis present

## 2019-09-11 DIAGNOSIS — N170 Acute kidney failure with tubular necrosis: Secondary | ICD-10-CM | POA: Diagnosis present

## 2019-09-11 DIAGNOSIS — J690 Pneumonitis due to inhalation of food and vomit: Secondary | ICD-10-CM | POA: Diagnosis not present

## 2019-09-11 DIAGNOSIS — S069X0S Unspecified intracranial injury without loss of consciousness, sequela: Secondary | ICD-10-CM

## 2019-09-11 DIAGNOSIS — J9621 Acute and chronic respiratory failure with hypoxia: Secondary | ICD-10-CM | POA: Diagnosis not present

## 2019-09-11 DIAGNOSIS — S069X9A Unspecified intracranial injury with loss of consciousness of unspecified duration, initial encounter: Secondary | ICD-10-CM | POA: Diagnosis present

## 2019-09-11 DIAGNOSIS — Z930 Tracheostomy status: Secondary | ICD-10-CM

## 2019-09-11 LAB — BLOOD GAS, ARTERIAL
Acid-Base Excess: 0.5 mmol/L (ref 0.0–2.0)
Bicarbonate: 25.3 mmol/L (ref 20.0–28.0)
FIO2: 28
O2 Saturation: 98.7 %
Patient temperature: 37
pCO2 arterial: 46.1 mmHg (ref 32.0–48.0)
pH, Arterial: 7.359 (ref 7.350–7.450)
pO2, Arterial: 116 mmHg — ABNORMAL HIGH (ref 83.0–108.0)

## 2019-09-11 LAB — LACTIC ACID, PLASMA
Lactic Acid, Venous: 0.8 mmol/L (ref 0.5–1.9)
Lactic Acid, Venous: 0.8 mmol/L (ref 0.5–1.9)
Lactic Acid, Venous: 0.8 mmol/L (ref 0.5–1.9)

## 2019-09-11 MED ORDER — CARBAMAZEPINE 100 MG PO CHEW
100.00 | CHEWABLE_TABLET | ORAL | Status: DC
Start: 2019-09-10 — End: 2019-09-11

## 2019-09-11 MED ORDER — GENERIC EXTERNAL MEDICATION
Status: DC
Start: ? — End: 2019-09-11

## 2019-09-11 MED ORDER — Medication
5.00 | Status: DC
Start: ? — End: 2019-09-11

## 2019-09-11 MED ORDER — LEVOTHYROXINE SODIUM 100 MCG PO TABS
100.00 | ORAL_TABLET | ORAL | Status: DC
Start: 2019-09-10 — End: 2019-09-11

## 2019-09-11 MED ORDER — SODIUM CHLORIDE 0.9 % IV SOLN
10.00 | INTRAVENOUS | Status: DC
Start: ? — End: 2019-09-11

## 2019-09-11 MED ORDER — BARO-CAT PO
10.00 | ORAL | Status: DC
Start: ? — End: 2019-09-11

## 2019-09-11 MED ORDER — HYDRALAZINE HCL 20 MG/ML IJ SOLN
10.00 | INTRAMUSCULAR | Status: DC
Start: ? — End: 2019-09-11

## 2019-09-11 MED ORDER — CLOTRIMAZOLE-BETAMETHASONE 1-0.05 % EX CREA
TOPICAL_CREAM | CUTANEOUS | Status: DC
Start: 2019-09-10 — End: 2019-09-11

## 2019-09-11 MED ORDER — ACETAMINOPHEN 325 MG PO TABS
650.00 | ORAL_TABLET | ORAL | Status: DC
Start: ? — End: 2019-09-11

## 2019-09-11 MED ORDER — LORATADINE 10 MG PO TABS
10.00 | ORAL_TABLET | ORAL | Status: DC
Start: 2019-09-11 — End: 2019-09-11

## 2019-09-11 MED ORDER — BACLOFEN 10 MG PO TABS
5.00 | ORAL_TABLET | ORAL | Status: DC
Start: ? — End: 2019-09-11

## 2019-09-11 MED ORDER — FERROUS SULFATE 300 (60 FE) MG/5ML PO SYRP
300.00 | ORAL_SOLUTION | ORAL | Status: DC
Start: 2019-09-10 — End: 2019-09-11

## 2019-09-11 MED ORDER — VICON FORTE PO CAPS
17.00 | ORAL_CAPSULE | ORAL | Status: DC
Start: 2019-09-11 — End: 2019-09-11

## 2019-09-11 MED ORDER — TAMSULOSIN HCL 0.4 MG PO CAPS
0.40 | ORAL_CAPSULE | ORAL | Status: DC
Start: 2019-09-10 — End: 2019-09-11

## 2019-09-11 MED ORDER — ASPIRIN 81 MG PO CHEW
81.00 | CHEWABLE_TABLET | ORAL | Status: DC
Start: 2019-09-11 — End: 2019-09-11

## 2019-09-11 MED ORDER — GENERIC EXTERNAL MEDICATION
5.00 | Status: DC
Start: ? — End: 2019-09-11

## 2019-09-11 MED ORDER — CARVEDILOL 12.5 MG PO TABS
12.50 | ORAL_TABLET | ORAL | Status: DC
Start: 2019-09-10 — End: 2019-09-11

## 2019-09-11 MED ORDER — PANTOPRAZOLE SODIUM 40 MG PO TBEC
40.00 | DELAYED_RELEASE_TABLET | ORAL | Status: DC
Start: 2019-09-11 — End: 2019-09-11

## 2019-09-11 MED ORDER — ALBUTEROL SULFATE (2.5 MG/3ML) 0.083% IN NEBU
2.50 | INHALATION_SOLUTION | RESPIRATORY_TRACT | Status: DC
Start: ? — End: 2019-09-11

## 2019-09-11 MED ORDER — FOLIC ACID 1 MG PO TABS
1.00 | ORAL_TABLET | ORAL | Status: DC
Start: 2019-09-11 — End: 2019-09-11

## 2019-09-11 MED ORDER — TRAMADOL HCL 50 MG PO TABS
25.00 | ORAL_TABLET | ORAL | Status: DC
Start: 2019-09-10 — End: 2019-09-11

## 2019-09-11 MED ORDER — POLYVINYL ALCOHOL-POVIDONE PF 1.4-0.6 % OP SOLN
1.00 | OPHTHALMIC | Status: DC
Start: 2019-09-10 — End: 2019-09-11

## 2019-09-11 MED ORDER — NITROGLYCERIN 0.4 MG SL SUBL
0.40 | SUBLINGUAL_TABLET | SUBLINGUAL | Status: DC
Start: ? — End: 2019-09-11

## 2019-09-11 MED ORDER — ONDANSETRON HCL 4 MG/2ML IJ SOLN
4.00 | INTRAMUSCULAR | Status: DC
Start: ? — End: 2019-09-11

## 2019-09-11 NOTE — Consult Note (Signed)
Pulmonary Critical Care Medicine [facility] [place]  PULMONARY SERVICE  Date of Service: 09/11/2019  PULMONARY CRITICAL CARE CONSULT   Joanne [name]  YNW:[id number]  DOB: 1970/12/31   DOA: 09/10/2019  Referring Physician: Carron Curie, MD  HPI: Joanne [name] is a 48 y.o. female seen for follow up of Acute on Chronic Respiratory Failure.  Patient has multiple medical problems including diabetes mellitus ARDS traumatic brain injury hypothyroidism seizure disorder who presented to the hospital because she was having vomiting.  Apparently had 6 episodes.  Was noted to have increasing shortness of breath also some abdominal pain along with the cough.  Patient was found to be Covid negative chest x-ray showing patchy infiltrates and patient was admitted for possibility of aspiration pneumonia.  Patient had tracheostomy placed and the ENT recommendation was that the tracheostomy should not be removed now at this time.  Patient apparently was noted to have a small bowel obstruction which did improve.  The patient was also recommended that she should have a PEG tube placed at some point.  Patient eventually ended up having has been started on CPAP trials she right now is on full support and will be assessed for possibility of CPAP trials  Review of Systems:  ROS performed and is unremarkable other than noted above.  Past Medical History:  Diagnosis Date  . Acute on chronic combined systolic and diastolic CHF (congestive heart failure) (HCC) 06/06/2018  . Acute on chronic respiratory failure with hypoxia (HCC) 06/06/2018  . ARDS (adult respiratory distress syndrome) (HCC) 06/06/2018  . Diabetes mellitus (HCC)   . History of traumatic brain injury   . Hypothyroidism   . Pneumonia due to Klebsiella pneumoniae (HCC) 06/06/2018  . Seizure disorder (HCC)   . Severe sepsis (HCC) 06/06/2018  . 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    Past Surgical History:  Procedure Laterality Date  .  TRACHEOSTOMY      Social History:    has an unknown smoking status. She has never used smokeless tobacco. She reports previous alcohol use. She reports previous drug use.  Family History: Non-Contributory to the present illness  Allergies Reviewed on the [facility]  Medications: Reviewed on Rounds  Physical Exam:  Vitals: Temperature 97.3 pulse 68 respiratory rate 17 blood pressure 120/68 saturations 100%  Ventilator Settings mode of ventilation assist control FiO2 28% tidal line 300 PEEP 5  . General: Comfortable at this time . Eyes: Grossly normal lids, irises & conjunctiva . ENT: grossly tongue is normal . Neck: no obvious mass . Cardiovascular: S1-S2 normal no gallop or rub . Respiratory: No rhonchi no rales are noted at this time . Abdomen: Soft and nontender . Skin: no rash seen on limited exam . Musculoskeletal: not rigid . Psychiatric:unable to assess . Neurologic: no seizure no involuntary movements         Labs on Admission:  Basic Metabolic Panel: Recent Labs  Lab 09/10/19 1740  NA 141  K 4.1  CL 110  CO2 25  GLUCOSE 98  BUN 10  CREATININE 1.45*  CALCIUM 8.6*    Recent Labs  Lab 09/11/19 0127  PHART 7.359  PCO2ART 46.1  PO2ART 116*  HCO3 25.3  O2SAT 98.7    Liver Function Tests: No results for input(s): AST, ALT, ALKPHOS, BILITOT, PROT, ALBUMIN in the last 168 hours. No results for input(s): LIPASE, AMYLASE in the last 168 hours. No results for input(s): AMMONIA in the last 168 hours.  CBC: Recent Labs  Lab 09/10/19 1740  WBC 8.4  NEUTROABS 5.6  HGB 9.1*  HCT 29.6*  MCV 97.7  PLT 235    Cardiac Enzymes: No results for input(s): CKTOTAL, CKMB, CKMBINDEX, TROPONINI in the last 168 hours.  BNP (last 3 results) No results for input(s): BNP in the last 8760 hours.  ProBNP (last 3 results) No results for input(s): PROBNP in the last 8760 hours.   Radiological Exams on Admission: Dg Chest Port 1 View  Result Date:  09/10/2019 CLINICAL DATA:  Acute on chronic respiratory failure EXAM: PORTABLE CHEST 1 VIEW COMPARISON:  Radiograph June 06, 2018 FINDINGS: Patient's chin obscures much of the lung apices. There is dextrocurvature of the thoracic spine some a to comparisons with associated chest wall deformity. Lung volumes remain low with some streaky basilar atelectatic changes. There is mild central airways thickening and perihilar opacity. No pneumothorax or effusion. IMPRESSION: 1. Airway thickening and perihilar opacity, could reflect bronchitis or early bronchopneumonia 2. 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 Electronically Signed   By: Lovena Le M.D.   On: 09/10/2019 16:29    Assessment/Plan Active Problems:   Acute on chronic respiratory failure with hypoxia (HCC)   Tracheostomy status (HCC)   Aspiration pneumonia due to gastric secretions (HCC)   Acute kidney injury (AKI) with acute tubular necrosis (ATN) ([place])   Traumatic brain injury ([place])   1. Acute on chronic respiratory failure with hypoxia at this time patient is on the ventilator and full support apparently was doing CPAP trials at the other facility we will have respiratory assess the RSB I and begin with weaning. 2. Aspiration pneumonia she was treated at the other facility completed 14 days of vancomycin apparently had MRSA pneumonia on November 7.  We will continue to follow radiologically the most recent chest x-ray shows possibility of bronchopneumonia no effusions. 3. Tracheostomy status ENT feels that her tracheostomy should not be removed we will continue with supportive care. 4. Acute renal failure she had been on dialysis this was discontinued we will monitor her labs and fluid status very closely. 5. Traumatic brain injury no changes we will continue with supportive care  I have personally seen and evaluated the patient, evaluated laboratory and imaging results, formulated the  assessment and plan and placed orders. The Patient requires high complexity decision making for assessment and support.  Case was discussed on Rounds with the Respiratory Therapy Staff Time Spent 43minutes  Allyne Gee, MD [facility] Pulmonary Critical Care Medicine Sleep Medicine

## 2019-09-12 ENCOUNTER — Other Ambulatory Visit (HOSPITAL_COMMUNITY): Payer: Self-pay

## 2019-09-12 ENCOUNTER — Institutional Professional Consult (permissible substitution) (HOSPITAL_COMMUNITY): Payer: Self-pay

## 2019-09-12 DIAGNOSIS — Z930 Tracheostomy status: Secondary | ICD-10-CM | POA: Diagnosis not present

## 2019-09-12 DIAGNOSIS — N170 Acute kidney failure with tubular necrosis: Secondary | ICD-10-CM | POA: Diagnosis not present

## 2019-09-12 DIAGNOSIS — J9621 Acute and chronic respiratory failure with hypoxia: Secondary | ICD-10-CM | POA: Diagnosis not present

## 2019-09-12 DIAGNOSIS — J690 Pneumonitis due to inhalation of food and vomit: Secondary | ICD-10-CM | POA: Diagnosis not present

## 2019-09-12 LAB — CBC
HCT: 29.3 % — ABNORMAL LOW (ref 36.0–46.0)
Hemoglobin: 8.9 g/dL — ABNORMAL LOW (ref 12.0–15.0)
MCH: 30.2 pg (ref 26.0–34.0)
MCHC: 30.4 g/dL (ref 30.0–36.0)
MCV: 99.3 fL (ref 80.0–100.0)
Platelets: 209 10*3/uL (ref 150–400)
RBC: 2.95 MIL/uL — ABNORMAL LOW (ref 3.87–5.11)
RDW: 13 % (ref 11.5–15.5)
WBC: 9.1 10*3/uL (ref 4.0–10.5)
nRBC: 0 % (ref 0.0–0.2)

## 2019-09-12 LAB — BASIC METABOLIC PANEL
Anion gap: 9 (ref 5–15)
BUN: 6 mg/dL (ref 6–20)
CO2: 24 mmol/L (ref 22–32)
Calcium: 8.6 mg/dL — ABNORMAL LOW (ref 8.9–10.3)
Chloride: 109 mmol/L (ref 98–111)
Creatinine, Ser: 1.17 mg/dL — ABNORMAL HIGH (ref 0.44–1.00)
GFR calc Af Amer: >60 mL/min (ref >60)
GFR calc non Af Amer: 55 mL/min — ABNORMAL LOW (ref >60)
Glucose, Bld: 116 mg/dL — ABNORMAL HIGH (ref 70–99)
Potassium: 3.6 mmol/L (ref 3.5–5.1)
Sodium: 142 mmol/L (ref 135–145)

## 2019-09-12 LAB — CARBAMAZEPINE, FREE AND TOTAL
Carbamazepine, Free: NOT DETECTED ug/mL (ref 0.6–4.2)
Carbamazepine, Total: 3.1 ug/mL — ABNORMAL LOW (ref 4.0–12.0)

## 2019-09-12 LAB — MAGNESIUM: Magnesium: 1.7 mg/dL (ref 1.7–2.4)

## 2019-09-12 MED ORDER — GENERIC EXTERNAL MEDICATION
Status: DC
Start: ? — End: 2019-09-12

## 2019-09-12 NOTE — Progress Notes (Addendum)
Pulmonary Critical Care Medicine [facility] [place]   PULMONARY CRITICAL CARE SERVICE  PROGRESS NOTE  Date of Service: 09/12/2019  Joanne [name]  QMG:[id number]  DOB: 07-03-1971   DOA: 09/10/2019  Referring Physician: Carron Curie, MD  HPI: Joanne [name] is a 48 y.o. female seen for follow up of Acute on Chronic Respiratory Failure.  Patient completed 2 hours on pressure support today and is now back resting on ventilator full support.  Psychotic stress.  Medications: Reviewed on Rounds  Physical Exam:  Vitals: Pulse 67 respiration 21 BP 159/82 O2 sat 98% temp 96.8  Ventilator Settings ventilator mode AC VC plus rate 15 tidal 1 300 PEEP 5 FiO2 28%.  . General: Comfortable at this time . Eyes: Grossly normal lids, irises & conjunctiva . ENT: grossly tongue is normal . Neck: no obvious mass . Cardiovascular: S1 S2 normal no gallop . Respiratory: No rales or rhonchi noted . Abdomen: soft . Skin: no rash seen on limited exam . Musculoskeletal: not rigid . Psychiatric:unable to assess . Neurologic: no seizure no involuntary movements         Lab Data:   Basic Metabolic Panel: Recent Labs  Lab 09/10/19 1740 09/12/19 0501  NA 141 142  K 4.1 3.6  CL 110 109  CO2 25 24  GLUCOSE 98 116*  BUN 10 6  CREATININE 1.45* 1.17*  CALCIUM 8.6* 8.6*  MG  --  1.7    ABG: Recent Labs  Lab 09/11/19 0127  PHART 7.359  PCO2ART 46.1  PO2ART 116*  HCO3 25.3  O2SAT 98.7    Liver Function Tests: No results for input(s): AST, ALT, ALKPHOS, BILITOT, PROT, ALBUMIN in the last 168 hours. No results for input(s): LIPASE, AMYLASE in the last 168 hours. No results for input(s): AMMONIA in the last 168 hours.  CBC: Recent Labs  Lab 09/10/19 1740 09/12/19 0501  WBC 8.4 9.1  NEUTROABS 5.6  --   HGB 9.1* 8.9*  HCT 29.6* 29.3*  MCV 97.7 99.3  PLT 235 209    Cardiac Enzymes: No results for input(s): CKTOTAL, CKMB, CKMBINDEX, TROPONINI in the last 168  hours.  BNP (last 3 results) No results for input(s): BNP in the last 8760 hours.  ProBNP (last 3 results) No results for input(s): PROBNP in the last 8760 hours.  Radiological Exams: Dg Abd 1 View  Result Date: 09/12/2019 CLINICAL DATA:  Feeding tube placement EXAM: ABDOMEN - 1 VIEW COMPARISON:  None. FINDINGS: Feeding tube with the metallic portion projecting over the body of the stomach. There is no bowel dilatation to suggest obstruction. There is no evidence of pneumoperitoneum, portal venous gas or pneumatosis. There are no pathologic calcifications along the expected course of the ureters. The osseous structures are unremarkable. There is levoscoliosis of the lumbar spine. IMPRESSION: Feeding tube with the metallic portion projecting over the body of the stomach. Electronically Signed   By: Elige Ko   On: 09/12/2019 10:18   Dg Abd 1 View  Result Date: 09/11/2019 CLINICAL DATA:  Enteric tube placement. EXAM: ABDOMEN - 1 VIEW COMPARISON:  Abdominal x-ray dated June 05, 2018. FINDINGS: Limited study due to rotation. Feeding tube tip likely in the distal stomach near the pylorus. The bowel gas pattern is normal. No radio-opaque calculi or other significant radiographic abnormality are seen. Prior left femoral neck pinning. IMPRESSION: 1. Limited evaluation of the enteric tube tip location due to rotation. Tip is likely in the distal stomach near the pylorus. Consider repeat  study for better evaluation. Electronically Signed   By: Titus Dubin M.D.   On: 09/11/2019 17:43   Dg Abd Portable 1v  Result Date: 09/12/2019 CLINICAL DATA:  Nasogastric tube placement. EXAM: PORTABLE ABDOMEN - 1 VIEW COMPARISON:  09/12/2019 at 10:01 hours. FINDINGS: Nasogastric tube terminates in the stomach. Patient is rotated. Gas is seen in nondistended small bowel and colon. Sutures are seen in the right upper quadrant. IMPRESSION: Nasogastric tube terminates in the stomach. Electronically Signed   By:  Lorin Picket M.D.   On: 09/12/2019 13:46   Dg Abd Portable 1v  Result Date: 09/12/2019 CLINICAL DATA:  NG tube placement. EXAM: PORTABLE ABDOMEN - 1 VIEW COMPARISON:  Radiograph yesterday 1723 hour FINDINGS: Tip of the weighted enteric tube is below the diaphragm in the distal stomach, but appears slightly retracted from prior exam. This is not definitively post pyloric. Nonobstructive bowel gas pattern. Scoliotic curvature of the spine. IMPRESSION: Tip of the weighted enteric tube below the diaphragm in the distal stomach, but appears slightly retracted from prior exam. This is not definitively post pyloric. Recommend advancement. Electronically Signed   By: Keith Rake M.D.   On: 09/12/2019 00:24    Assessment/Plan Active Problems:   Acute on chronic respiratory failure with hypoxia (HCC)   Tracheostomy status (HCC)   Aspiration pneumonia due to gastric secretions (HCC)   Acute kidney injury (AKI) with acute tubular necrosis (ATN) ([place])   Traumatic brain injury ([place])   1. Acute on chronic respiratory failure with hypoxia patient will continue to wean as tolerated per protocol.  Completed 2 hours of pressure support today.  Continue supportive measures and pulmonary toilet. 2. Aspiration pneumonia continue to monitor closely. 3. Tracheostomy status ENT feels that her tracheostomy should not be removed we will continue with supportive care. 4. Acute renal failure she had been on dialysis this was discontinued we will monitor her labs and fluid status very closely. 5. Traumatic brain injury no changes we will continue with supportive care   I have personally seen and evaluated the patient, evaluated laboratory and imaging results, formulated the assessment and plan and placed orders. The Patient requires high complexity decision making for assessment and support.  Case was discussed on Rounds with the Respiratory Therapy Staff  Allyne Gee, MD [facility] Pulmonary Critical Care  Medicine Sleep Medicine

## 2019-09-13 DIAGNOSIS — J690 Pneumonitis due to inhalation of food and vomit: Secondary | ICD-10-CM | POA: Diagnosis not present

## 2019-09-13 DIAGNOSIS — Z930 Tracheostomy status: Secondary | ICD-10-CM | POA: Diagnosis not present

## 2019-09-13 DIAGNOSIS — N170 Acute kidney failure with tubular necrosis: Secondary | ICD-10-CM | POA: Diagnosis not present

## 2019-09-13 DIAGNOSIS — J9621 Acute and chronic respiratory failure with hypoxia: Secondary | ICD-10-CM | POA: Diagnosis not present

## 2019-09-13 LAB — MAGNESIUM: Magnesium: 2 mg/dL (ref 1.7–2.4)

## 2019-09-13 NOTE — Progress Notes (Addendum)
Pulmonary Critical Care Medicine [place]   PULMONARY CRITICAL CARE SERVICE  PROGRESS NOTE  Date of Service: 09/13/2019  Joanne [name]  CZY:[id number]  DOB: 27-Jan-1971   DOA: 09/10/2019  Referring Physician: Merton Border, MD  HPI: Joanne [name] is a 48 y.o. female seen for follow up of Acute on Chronic Respiratory Failure.  Patient remains on full support on the ventilator at this time managed to do 4 hours on pressure support today as outlined currently back on the vent.  Medications: Reviewed on Rounds  Physical Exam:  Vitals: Pulse 62 respirations 18 BP 130/75 O2 sat 1% temp 97.5  Ventilator Settings ventilator mode AC VC plus rate of 18 tidal volume 300 PEEP of 5 FiO2 28%  . General: Comfortable at this time . Eyes: Grossly normal lids, irises & conjunctiva . ENT: grossly tongue is normal . Neck: no obvious mass . Cardiovascular: S1 S2 normal no gallop . Respiratory: No rales or rhonchi noted . Abdomen: soft . Skin: no rash seen on limited exam . Musculoskeletal: not rigid . Psychiatric:unable to assess . Neurologic: no seizure no involuntary movements         Lab Data:   Basic Metabolic Panel: Recent Labs  Lab 09/10/19 1740 09/12/19 0501 09/13/19 1423  NA 141 142  --   K 4.1 3.6  --   CL 110 109  --   CO2 25 24  --   GLUCOSE 98 116*  --   BUN 10 6  --   CREATININE 1.45* 1.17*  --   CALCIUM 8.6* 8.6*  --   MG  --  1.7 2.0    ABG: Recent Labs  Lab 09/11/19 0127  PHART 7.359  PCO2ART 46.1  PO2ART 116*  HCO3 25.3  O2SAT 98.7    Liver Function Tests: No results for input(s): AST, ALT, ALKPHOS, BILITOT, PROT, ALBUMIN in the last 168 hours. No results for input(s): LIPASE, AMYLASE in the last 168 hours. No results for input(s): AMMONIA in the last 168 hours.  CBC: Recent Labs  Lab 09/10/19 1740 09/12/19 0501  WBC 8.4 9.1  NEUTROABS 5.6  --   HGB 9.1* 8.9*  HCT 29.6* 29.3*  MCV 97.7 99.3  PLT 235 209    Cardiac  Enzymes: No results for input(s): CKTOTAL, CKMB, CKMBINDEX, TROPONINI in the last 168 hours.  BNP (last 3 results) No results for input(s): BNP in the last 8760 hours.  ProBNP (last 3 results) No results for input(s): PROBNP in the last 8760 hours.  Radiological Exams: Dg Abd 1 View  Result Date: 09/12/2019 CLINICAL DATA:  Feeding tube placement EXAM: ABDOMEN - 1 VIEW COMPARISON:  None. FINDINGS: Feeding tube with the metallic portion projecting over the body of the stomach. There is no bowel dilatation to suggest obstruction. There is no evidence of pneumoperitoneum, portal venous gas or pneumatosis. There are no pathologic calcifications along the expected course of the ureters. The osseous structures are unremarkable. There is levoscoliosis of the lumbar spine. IMPRESSION: Feeding tube with the metallic portion projecting over the body of the stomach. Electronically Signed   By: Kathreen Devoid   On: 09/12/2019 10:18   Dg Abd 1 View  Result Date: 09/11/2019 CLINICAL DATA:  Enteric tube placement. EXAM: ABDOMEN - 1 VIEW COMPARISON:  Abdominal x-ray dated June 05, 2018. FINDINGS: Limited study due to rotation. Feeding tube tip likely in the distal stomach near the pylorus. The bowel gas pattern is normal. No radio-opaque calculi or  other significant radiographic abnormality are seen. Prior left femoral neck pinning. IMPRESSION: 1. Limited evaluation of the enteric tube tip location due to rotation. Tip is likely in the distal stomach near the pylorus. Consider repeat study for better evaluation. Electronically Signed   By: Obie Dredge M.D.   On: 09/11/2019 17:43   Dg Abd Portable 1v  Result Date: 09/12/2019 CLINICAL DATA:  Nasogastric tube placement. EXAM: PORTABLE ABDOMEN - 1 VIEW COMPARISON:  09/12/2019 at 10:01 hours. FINDINGS: Nasogastric tube terminates in the stomach. Patient is rotated. Gas is seen in nondistended small bowel and colon. Sutures are seen in the right upper quadrant.  IMPRESSION: Nasogastric tube terminates in the stomach. Electronically Signed   By: Leanna Battles M.D.   On: 09/12/2019 13:46   Dg Abd Portable 1v  Result Date: 09/12/2019 CLINICAL DATA:  NG tube placement. EXAM: PORTABLE ABDOMEN - 1 VIEW COMPARISON:  Radiograph yesterday 1723 hour FINDINGS: Tip of the weighted enteric tube is below the diaphragm in the distal stomach, but appears slightly retracted from prior exam. This is not definitively post pyloric. Nonobstructive bowel gas pattern. Scoliotic curvature of the spine. IMPRESSION: Tip of the weighted enteric tube below the diaphragm in the distal stomach, but appears slightly retracted from prior exam. This is not definitively post pyloric. Recommend advancement. Electronically Signed   By: Narda Rutherford M.D.   On: 09/12/2019 00:24    Assessment/Plan Active Problems:   Acute on chronic respiratory failure with hypoxia (HCC)   Tracheostomy status (HCC)   Aspiration pneumonia due to gastric secretions (HCC)   Acute kidney injury (AKI) with acute tubular necrosis (ATN) (HCC)   Traumatic brain injury (HCC)   1. Acute on chronic respiratory failure with hypoxia patient will continue to wean as tolerated per protocol.  Completed 4 hours of pressure support today.  Continue supportive measures and pulmonary toilet. 2. Aspiration pneumonia continue to monitor closely. 3. Tracheostomy status ENT feels that her tracheostomy should not be removed we will continue with supportive care. 4. Acute renal failure she had been on dialysis this was discontinued we will monitor her labs and fluid status very closely. 5. Traumatic brain injury no changes we will continue with supportive care   I have personally seen and evaluated the patient, evaluated laboratory and imaging results, formulated the assessment and plan and placed orders. The Patient requires high complexity decision making for assessment and support.  Case was discussed on Rounds with the  Respiratory Therapy Staff  Yevonne Pax, MD [facility] Pulmonary Critical Care Medicine Sleep Medicine

## 2019-09-14 ENCOUNTER — Other Ambulatory Visit (HOSPITAL_COMMUNITY): Payer: Self-pay

## 2019-09-14 DIAGNOSIS — J690 Pneumonitis due to inhalation of food and vomit: Secondary | ICD-10-CM | POA: Diagnosis not present

## 2019-09-14 DIAGNOSIS — N170 Acute kidney failure with tubular necrosis: Secondary | ICD-10-CM | POA: Diagnosis not present

## 2019-09-14 DIAGNOSIS — Z930 Tracheostomy status: Secondary | ICD-10-CM | POA: Diagnosis not present

## 2019-09-14 DIAGNOSIS — J9621 Acute and chronic respiratory failure with hypoxia: Secondary | ICD-10-CM | POA: Diagnosis not present

## 2019-09-14 MED ORDER — GENERIC EXTERNAL MEDICATION
Status: DC
Start: ? — End: 2019-09-14

## 2019-09-14 NOTE — Progress Notes (Signed)
Pulmonary Critical Care Medicine [place]   PULMONARY CRITICAL CARE SERVICE  PROGRESS NOTE  Date of Service: 09/14/2019  Joanne [name]  HYQ:[id number]  DOB: [birth year]   DOA: 09/10/2019  Referring Physician: Merton Border, MD  HPI: Joanne [name] is a 48 y.o. female seen for follow up of Acute on Chronic Respiratory Failure.  Patient is on no wean today for an 8-hour goal on pressure support  Medications: Reviewed on Rounds  Physical Exam:  Vitals: Temperature 97.8 pulse 89 respiratory 21 blood pressure 112/72 saturations 100%  Ventilator Settings mode ventilation pressure support FiO2 28% pressure 12 PEEP 5  . General: Comfortable at this time . Eyes: Grossly normal lids, irises & conjunctiva . ENT: grossly tongue is normal . Neck: no obvious mass . Cardiovascular: S1 S2 normal no gallop . Respiratory: No rhonchi coarse breath sounds noted at this time . Abdomen: soft . Skin: no rash seen on limited exam . Musculoskeletal: not rigid . Psychiatric:unable to assess . Neurologic: no seizure no involuntary movements         Lab Data:   Basic Metabolic Panel: Recent Labs  Lab 09/10/19 1740 09/12/19 0501 09/13/19 1423  NA 141 142  --   K 4.1 3.6  --   CL 110 109  --   CO2 25 24  --   GLUCOSE 98 116*  --   BUN 10 6  --   CREATININE 1.45* 1.17*  --   CALCIUM 8.6* 8.6*  --   MG  --  1.7 2.0    ABG: Recent Labs  Lab 09/11/19 0127  PHART 7.359  PCO2ART 46.1  PO2ART 116*  HCO3 25.3  O2SAT 98.7    Liver Function Tests: No results for input(s): AST, ALT, ALKPHOS, BILITOT, PROT, ALBUMIN in the last 168 hours. No results for input(s): LIPASE, AMYLASE in the last 168 hours. No results for input(s): AMMONIA in the last 168 hours.  CBC: Recent Labs  Lab 09/10/19 1740 09/12/19 0501  WBC 8.4 9.1  NEUTROABS 5.6  --   HGB 9.1* 8.9*  HCT 29.6* 29.3*  MCV 97.7 99.3  PLT 235 209    Cardiac Enzymes: No results for input(s): CKTOTAL,  CKMB, CKMBINDEX, TROPONINI in the last 168 hours.  BNP (last 3 results) No results for input(s): BNP in the last 8760 hours.  ProBNP (last 3 results) No results for input(s): PROBNP in the last 8760 hours.  Radiological Exams: Dg Abd Portable 1v  Result Date: 09/12/2019 CLINICAL DATA:  Nasogastric tube placement. EXAM: PORTABLE ABDOMEN - 1 VIEW COMPARISON:  09/12/2019 at 10:01 hours. FINDINGS: Nasogastric tube terminates in the stomach. Patient is rotated. Gas is seen in nondistended small bowel and colon. Sutures are seen in the right upper quadrant. IMPRESSION: Nasogastric tube terminates in the stomach. Electronically Signed   By: Lorin Picket M.D.   On: 09/12/2019 13:46    Assessment/Plan Active Problems:   Acute on chronic respiratory failure with hypoxia (HCC)   Tracheostomy status (HCC)   Aspiration pneumonia due to gastric secretions (HCC)   Acute kidney injury (AKI) with acute tubular necrosis (ATN) ([place])   Traumatic brain injury ([place])   1. Acute on chronic respiratory failure hypoxia point continue with 8-hour goal of weaning 2. Tracheostomy remains in place for now 3. Aspiration pneumonia treated 4. Acute renal failure follow-up on labs 5. Traumatic brain injury no changes continue present management   I have personally seen and evaluated the patient, evaluated laboratory and  imaging results, formulated the assessment and plan and placed orders. The Patient requires high complexity decision making for assessment and support.  Case was discussed on Rounds with the Respiratory Therapy Staff  Allyne Gee, MD [facility] Pulmonary Critical Care Medicine Sleep Medicine

## 2019-09-15 DIAGNOSIS — J690 Pneumonitis due to inhalation of food and vomit: Secondary | ICD-10-CM | POA: Diagnosis not present

## 2019-09-15 DIAGNOSIS — J9621 Acute and chronic respiratory failure with hypoxia: Secondary | ICD-10-CM | POA: Diagnosis not present

## 2019-09-15 DIAGNOSIS — Z930 Tracheostomy status: Secondary | ICD-10-CM | POA: Diagnosis not present

## 2019-09-15 DIAGNOSIS — N170 Acute kidney failure with tubular necrosis: Secondary | ICD-10-CM | POA: Diagnosis not present

## 2019-09-15 NOTE — Progress Notes (Signed)
Pulmonary Critical Care Medicine [place]   PULMONARY CRITICAL CARE SERVICE  PROGRESS NOTE  Date of Service: 09/15/2019  Joanne [name]  EVO:[id number]  DOB: [birth year]   DOA: 09/10/2019  Referring Physician: Merton Border, MD  HPI: Joanne [name] is a 48 y.o. female seen for follow up of Acute on Chronic Respiratory Failure.  Patient is on pressure support mode is weaning on 12/5 seems to be tolerating it well  Medications: Reviewed on Rounds  Physical Exam:  Vitals: Temperature 99.1 pulse 86 respiratory 28 blood pressure 146/88 saturations 100%  Ventilator Settings mode of ventilation pressure support FiO2 28% tidal volume 325 pressure poor 12/5  . General: Comfortable at this time . Eyes: Grossly normal lids, irises & conjunctiva . ENT: grossly tongue is normal . Neck: no obvious mass . Cardiovascular: S1 S2 normal no gallop . Respiratory: No rhonchi no rales are noted at this time . Abdomen: soft . Skin: no rash seen on limited exam . Musculoskeletal: not rigid . Psychiatric:unable to assess . Neurologic: no seizure no involuntary movements         Lab Data:   Basic Metabolic Panel: Recent Labs  Lab 09/10/19 1740 09/12/19 0501 09/13/19 1423  NA 141 142  --   K 4.1 3.6  --   CL 110 109  --   CO2 25 24  --   GLUCOSE 98 116*  --   BUN 10 6  --   CREATININE 1.45* 1.17*  --   CALCIUM 8.6* 8.6*  --   MG  --  1.7 2.0    ABG: Recent Labs  Lab 09/11/19 0127  PHART 7.359  PCO2ART 46.1  PO2ART 116*  HCO3 25.3  O2SAT 98.7    Liver Function Tests: No results for input(s): AST, ALT, ALKPHOS, BILITOT, PROT, ALBUMIN in the last 168 hours. No results for input(s): LIPASE, AMYLASE in the last 168 hours. No results for input(s): AMMONIA in the last 168 hours.  CBC: Recent Labs  Lab 09/10/19 1740 09/12/19 0501  WBC 8.4 9.1  NEUTROABS 5.6  --   HGB 9.1* 8.9*  HCT 29.6* 29.3*  MCV 97.7 99.3  PLT 235 209    Cardiac Enzymes: No  results for input(s): CKTOTAL, CKMB, CKMBINDEX, TROPONINI in the last 168 hours.  BNP (last 3 results) No results for input(s): BNP in the last 8760 hours.  ProBNP (last 3 results) No results for input(s): PROBNP in the last 8760 hours.  Radiological Exams: Dg Abd 1 View  Result Date: 09/14/2019 CLINICAL DATA:  Nasogastric tube placement. EXAM: ABDOMEN - 1 VIEW COMPARISON:  09/12/2019 FINDINGS: 2oblique radiographs. Nasogastric tube terminates within either the distal stomach or proximal duodenum. Cholecystectomy clips. Gas-filled loops of small bowel are mildly prominent, similar. No gross free intraperitoneal air. IMPRESSION: Nasogastric tube terminating either at distal stomach or proximal duodenum. Electronically Signed   By: Abigail Miyamoto M.D.   On: 09/14/2019 12:47    Assessment/Plan Active Problems:   Acute on chronic respiratory failure with hypoxia (HCC)   Tracheostomy status (HCC)   Aspiration pneumonia due to gastric secretions (HCC)   Acute kidney injury (AKI) with acute tubular necrosis (ATN) ([place])   Traumatic brain injury ([place])   1. Acute on chronic respiratory failure with hypoxia patient continues to wean on pressure support will continue to advance as tolerated 2. Aspiration pneumonia treated we will continue supportive care 3. Acute renal failure improving labs 4. Traumatic brain injury no change 5. Tracheostomy remains  in place   I have personally seen and evaluated the patient, evaluated laboratory and imaging results, formulated the assessment and plan and placed orders. The Patient requires high complexity decision making for assessment and support.  Case was discussed on Rounds with the Respiratory Therapy Staff  Allyne Gee, MD [facility] Pulmonary Critical Care Medicine Sleep Medicine

## 2019-09-16 DIAGNOSIS — J9621 Acute and chronic respiratory failure with hypoxia: Secondary | ICD-10-CM | POA: Diagnosis not present

## 2019-09-16 DIAGNOSIS — Z930 Tracheostomy status: Secondary | ICD-10-CM | POA: Diagnosis not present

## 2019-09-16 DIAGNOSIS — J690 Pneumonitis due to inhalation of food and vomit: Secondary | ICD-10-CM | POA: Diagnosis not present

## 2019-09-16 DIAGNOSIS — N170 Acute kidney failure with tubular necrosis: Secondary | ICD-10-CM | POA: Diagnosis not present

## 2019-09-16 LAB — CBC
HCT: 28.1 % — ABNORMAL LOW (ref 36.0–46.0)
Hemoglobin: 8.8 g/dL — ABNORMAL LOW (ref 12.0–15.0)
MCH: 30.7 pg (ref 26.0–34.0)
MCHC: 31.3 g/dL (ref 30.0–36.0)
MCV: 97.9 fL (ref 80.0–100.0)
Platelets: 203 10*3/uL (ref 150–400)
RBC: 2.87 MIL/uL — ABNORMAL LOW (ref 3.87–5.11)
RDW: 12.9 % (ref 11.5–15.5)
WBC: 10.4 10*3/uL (ref 4.0–10.5)
nRBC: 0 % (ref 0.0–0.2)

## 2019-09-16 NOTE — Progress Notes (Signed)
Pulmonary Critical Care Medicine [place]   PULMONARY CRITICAL CARE SERVICE  PROGRESS NOTE  Date of Service: 09/16/2019  RENAD JENNIGES  XTG:[id number]  DOB: [birth year]   DOA: 09/10/2019  Referring Physician: Merton Border, MD  HPI: Joanne [name] is a 48 y.o. female seen for follow up of Acute on Chronic Respiratory Failure.  Patient currently is on the wean's goal is for 16 hours today  Medications: Reviewed on Rounds  Physical Exam:  Vitals: Temperature 97.5 pulse 77 respiratory 20 blood pressure 115/86 saturations 100%  Ventilator Settings mode ventilation pressure support FiO2 28% pressure support 12 PEEP 5 tidal line 351  . General: Comfortable at this time . Eyes: Grossly normal lids, irises & conjunctiva . ENT: grossly tongue is normal . Neck: no obvious mass . Cardiovascular: S1 S2 normal no gallop . Respiratory: No rhonchi no rales are noted at this time . Abdomen: soft . Skin: no rash seen on limited exam . Musculoskeletal: not rigid . Psychiatric:unable to assess . Neurologic: no seizure no involuntary movements         Lab Data:   Basic Metabolic Panel: Recent Labs  Lab 09/10/19 1740 09/12/19 0501 09/13/19 1423  NA 141 142  --   K 4.1 3.6  --   CL 110 109  --   CO2 25 24  --   GLUCOSE 98 116*  --   BUN 10 6  --   CREATININE 1.45* 1.17*  --   CALCIUM 8.6* 8.6*  --   MG  --  1.7 2.0    ABG: Recent Labs  Lab 09/11/19 0127  PHART 7.359  PCO2ART 46.1  PO2ART 116*  HCO3 25.3  O2SAT 98.7    Liver Function Tests: No results for input(s): AST, ALT, ALKPHOS, BILITOT, PROT, ALBUMIN in the last 168 hours. No results for input(s): LIPASE, AMYLASE in the last 168 hours. No results for input(s): AMMONIA in the last 168 hours.  CBC: Recent Labs  Lab 09/10/19 1740 09/12/19 0501 09/16/19 0528  WBC 8.4 9.1 10.4  NEUTROABS 5.6  --   --   HGB 9.1* 8.9* 8.8*  HCT 29.6* 29.3* 28.1*  MCV 97.7 99.3 97.9  PLT 235 209 203     Cardiac Enzymes: No results for input(s): CKTOTAL, CKMB, CKMBINDEX, TROPONINI in the last 168 hours.  BNP (last 3 results) No results for input(s): BNP in the last 8760 hours.  ProBNP (last 3 results) No results for input(s): PROBNP in the last 8760 hours.  Radiological Exams: No results found.  Assessment/Plan Active Problems:   Acute on chronic respiratory failure with hypoxia (HCC)   Tracheostomy status (HCC)   Aspiration pneumonia due to gastric secretions (HCC)   Acute kidney injury (AKI) with acute tubular necrosis (ATN) (HCC)   Traumatic brain injury ([place])   1. Acute on chronic respiratory failure hypoxia plan is to continue with weaning on protocol 16-hour goal on pressure support 2. Tracheostomy remains in place we will continue with supportive care 3. Aspiration pneumonia treated 4. Acute kidney injury follow-up labs stable 5. Traumatic brain injury no changes   I have personally seen and evaluated the patient, evaluated laboratory and imaging results, formulated the assessment and plan and placed orders. The Patient requires high complexity decision making for assessment and support.  Case was discussed on Rounds with the Respiratory Therapy Staff  Allyne Gee, MD [facility] Pulmonary Critical Care Medicine Sleep Medicine

## 2019-09-17 DIAGNOSIS — J690 Pneumonitis due to inhalation of food and vomit: Secondary | ICD-10-CM | POA: Diagnosis not present

## 2019-09-17 DIAGNOSIS — N170 Acute kidney failure with tubular necrosis: Secondary | ICD-10-CM | POA: Diagnosis not present

## 2019-09-17 DIAGNOSIS — Z930 Tracheostomy status: Secondary | ICD-10-CM | POA: Diagnosis not present

## 2019-09-17 DIAGNOSIS — J9621 Acute and chronic respiratory failure with hypoxia: Secondary | ICD-10-CM | POA: Diagnosis not present

## 2019-09-17 NOTE — Progress Notes (Signed)
Pulmonary Critical Care Medicine [place]   PULMONARY CRITICAL CARE SERVICE  PROGRESS NOTE  Date of Service: 09/17/2019  Joanne [name]  [id number]  DOB: October 19, 1971   DOA: 09/10/2019  Referring Physician: Merton Border, MD  HPI: Joanne [name] is a 48 y.o. female seen for follow up of Acute on Chronic Respiratory Failure.  Patient right now is on T collar without distress has been on 28% FiO2  Medications: Reviewed on Rounds  Physical Exam:  Vitals: Temperature 97.4 pulse 79 respiratory rate 25 blood pressure 137/78 saturations 100%  Ventilator Settings off the ventilator right now on T collar with an FiO2 of 28%  . General: Comfortable at this time . Eyes: Grossly normal lids, irises & conjunctiva . ENT: grossly tongue is normal . Neck: no obvious mass . Cardiovascular: S1 S2 normal no gallop . Respiratory: No rhonchi coarse breath sounds are noted . Abdomen: soft . Skin: no rash seen on limited exam . Musculoskeletal: not rigid . Psychiatric:unable to assess . Neurologic: no seizure no involuntary movements         Lab Data:   Basic Metabolic Panel: Recent Labs  Lab 09/10/19 1740 09/12/19 0501 09/13/19 1423  NA 141 142  --   K 4.1 3.6  --   CL 110 109  --   CO2 25 24  --   GLUCOSE 98 116*  --   BUN 10 6  --   CREATININE 1.45* 1.17*  --   CALCIUM 8.6* 8.6*  --   MG  --  1.7 2.0    ABG: Recent Labs  Lab 09/11/19 0127  PHART 7.359  PCO2ART 46.1  PO2ART 116*  HCO3 25.3  O2SAT 98.7    Liver Function Tests: No results for input(s): AST, ALT, ALKPHOS, BILITOT, PROT, ALBUMIN in the last 168 hours. No results for input(s): LIPASE, AMYLASE in the last 168 hours. No results for input(s): AMMONIA in the last 168 hours.  CBC: Recent Labs  Lab 09/10/19 1740 09/12/19 0501 09/16/19 0528  WBC 8.4 9.1 10.4  NEUTROABS 5.6  --   --   HGB 9.1* 8.9* 8.8*  HCT 29.6* 29.3* 28.1*  MCV 97.7 99.3 97.9  PLT 235 209 203    Cardiac  Enzymes: No results for input(s): CKTOTAL, CKMB, CKMBINDEX, TROPONINI in the last 168 hours.  BNP (last 3 results) No results for input(s): BNP in the last 8760 hours.  ProBNP (last 3 results) No results for input(s): PROBNP in the last 8760 hours.  Radiological Exams: No results found.  Assessment/Plan Active Problems:   Acute on chronic respiratory failure with hypoxia (HCC)   Tracheostomy status (HCC)   Aspiration pneumonia due to gastric secretions (HCC)   Acute kidney injury (AKI) with acute tubular necrosis (ATN) (HCC)   Traumatic brain injury ([place])   1. Acute on chronic respiratory failure hypoxia we will continue with weaning on T collar 2. Tracheostomy will remain in place 3. Aspiration pneumonia treated we will continue with supportive care 4. Acute renal injury supportive care 5. Traumatic brain injury no change   I have personally seen and evaluated the patient, evaluated laboratory and imaging results, formulated the assessment and plan and placed orders. The Patient requires high complexity decision making for assessment and support.  Case was discussed on Rounds with the Respiratory Therapy Staff  Allyne Gee, MD [facility] Pulmonary Critical Care Medicine Sleep Medicine

## 2019-09-18 DIAGNOSIS — N170 Acute kidney failure with tubular necrosis: Secondary | ICD-10-CM | POA: Diagnosis not present

## 2019-09-18 DIAGNOSIS — J9621 Acute and chronic respiratory failure with hypoxia: Secondary | ICD-10-CM | POA: Diagnosis not present

## 2019-09-18 DIAGNOSIS — J690 Pneumonitis due to inhalation of food and vomit: Secondary | ICD-10-CM | POA: Diagnosis not present

## 2019-09-18 DIAGNOSIS — Z930 Tracheostomy status: Secondary | ICD-10-CM | POA: Diagnosis not present

## 2019-09-18 NOTE — Progress Notes (Signed)
Pulmonary Critical Care Medicine [place]   PULMONARY CRITICAL CARE SERVICE  PROGRESS NOTE  Date of Service: 09/18/2019  COREENA [name]  [id number]  DOB: 04-16-1971   DOA: 09/10/2019  Referring Physician: Merton Border, MD  HPI: Joanne [name] is a 48 y.o. female seen for follow up of Acute on Chronic Respiratory Failure.  Patient currently is on T collar has been on 28% FiO2 with good saturations she is doing well with the wean  Medications: Reviewed on Rounds  Physical Exam:  Vitals: Temperature 97.7 pulse 87 respiratory 28 blood pressure 136/75 saturations 100%  Ventilator Settings off the ventilator on T collar with an FiO2 of 28%  . General: Comfortable at this time . Eyes: Grossly normal lids, irises & conjunctiva . ENT: grossly tongue is normal . Neck: no obvious mass . Cardiovascular: S1 S2 normal no gallop . Respiratory: No rhonchi no rales are noted at this time . Abdomen: soft . Skin: no rash seen on limited exam . Musculoskeletal: not rigid . Psychiatric:unable to assess . Neurologic: no seizure no involuntary movements         Lab Data:   Basic Metabolic Panel: Recent Labs  Lab 09/12/19 0501 09/13/19 1423  NA 142  --   K 3.6  --   CL 109  --   CO2 24  --   GLUCOSE 116*  --   BUN 6  --   CREATININE 1.17*  --   CALCIUM 8.6*  --   MG 1.7 2.0    ABG: No results for input(s): PHART, PCO2ART, PO2ART, HCO3, O2SAT in the last 168 hours.  Liver Function Tests: No results for input(s): AST, ALT, ALKPHOS, BILITOT, PROT, ALBUMIN in the last 168 hours. No results for input(s): LIPASE, AMYLASE in the last 168 hours. No results for input(s): AMMONIA in the last 168 hours.  CBC: Recent Labs  Lab 09/12/19 0501 09/16/19 0528  WBC 9.1 10.4  HGB 8.9* 8.8*  HCT 29.3* 28.1*  MCV 99.3 97.9  PLT 209 203    Cardiac Enzymes: No results for input(s): CKTOTAL, CKMB, CKMBINDEX, TROPONINI in the last 168 hours.  BNP (last 3  results) No results for input(s): BNP in the last 8760 hours.  ProBNP (last 3 results) No results for input(s): PROBNP in the last 8760 hours.  Radiological Exams: No results found.  Assessment/Plan Active Problems:   Acute on chronic respiratory failure with hypoxia (HCC)   Tracheostomy status (HCC)   Aspiration pneumonia due to gastric secretions (HCC)   Acute kidney injury (AKI) with acute tubular necrosis (ATN) (HCC)   Traumatic brain injury ([place])   1. Acute on chronic respiratory failure hypoxia plan continue with weaning on T collar at baseline is going to be T collar 2. Tracheostomy will remain in place 3. Aspiration pneumonia treated clinically improving 4. Acute renal failure follow labs 5. Traumatic brain injury no change   I have personally seen and evaluated the patient, evaluated laboratory and imaging results, formulated the assessment and plan and placed orders. The Patient requires high complexity decision making for assessment and support.  Case was discussed on Rounds with the Respiratory Therapy Staff  Allyne Gee, MD [facility] Pulmonary Critical Care Medicine Sleep Medicine

## 2019-09-19 DIAGNOSIS — N170 Acute kidney failure with tubular necrosis: Secondary | ICD-10-CM | POA: Diagnosis not present

## 2019-09-19 DIAGNOSIS — Z930 Tracheostomy status: Secondary | ICD-10-CM | POA: Diagnosis not present

## 2019-09-19 DIAGNOSIS — J9621 Acute and chronic respiratory failure with hypoxia: Secondary | ICD-10-CM | POA: Diagnosis not present

## 2019-09-19 DIAGNOSIS — J690 Pneumonitis due to inhalation of food and vomit: Secondary | ICD-10-CM | POA: Diagnosis not present

## 2019-09-19 NOTE — Progress Notes (Signed)
Pulmonary Critical Care Medicine [place]   PULMONARY CRITICAL CARE SERVICE  PROGRESS NOTE  Date of Service: 09/19/2019  Joanne [name]  IRJ:[id number]  DOB: 04/23/1971   DOA: 09/10/2019  Referring Physician: Merton Border, MD  HPI: Joanne [name] is a 48 y.o. female seen for follow up of Acute on Chronic Respiratory Failure.  Patient currently is on T collar has been on 28% FiO2 full support  Medications: Reviewed on Rounds  Physical Exam:  Vitals: Temperature 97.8 pulse 78 respiratory rate 24 blood pressure 105/58 saturations 99%  Ventilator Settings off the ventilator currently on T collar  . General: Comfortable at this time . Eyes: Grossly normal lids, irises & conjunctiva . ENT: grossly tongue is normal . Neck: no obvious mass . Cardiovascular: S1 S2 normal no gallop . Respiratory: No rhonchi coarse breath sounds . Abdomen: soft . Skin: no rash seen on limited exam . Musculoskeletal: not rigid . Psychiatric:unable to assess . Neurologic: no seizure no involuntary movements         Lab Data:   Basic Metabolic Panel: Recent Labs  Lab 09/13/19 1423  MG 2.0    ABG: No results for input(s): PHART, PCO2ART, PO2ART, HCO3, O2SAT in the last 168 hours.  Liver Function Tests: No results for input(s): AST, ALT, ALKPHOS, BILITOT, PROT, ALBUMIN in the last 168 hours. No results for input(s): LIPASE, AMYLASE in the last 168 hours. No results for input(s): AMMONIA in the last 168 hours.  CBC: Recent Labs  Lab 09/16/19 0528  WBC 10.4  HGB 8.8*  HCT 28.1*  MCV 97.9  PLT 203    Cardiac Enzymes: No results for input(s): CKTOTAL, CKMB, CKMBINDEX, TROPONINI in the last 168 hours.  BNP (last 3 results) No results for input(s): BNP in the last 8760 hours.  ProBNP (last 3 results) No results for input(s): PROBNP in the last 8760 hours.  Radiological Exams: No results found.  Assessment/Plan Active Problems:   Acute on chronic  respiratory failure with hypoxia (HCC)   Tracheostomy status (HCC)   Aspiration pneumonia due to gastric secretions (HCC)   Acute kidney injury (AKI) with acute tubular necrosis (ATN) (HCC)   Traumatic brain injury ([place])   1. Acute on chronic respiratory failure hypoxia patient currently is on T collar has been on 28% FiO2 with good saturations. 2. Aspiration pneumonia treated we will continue with supportive care 3. Acute renal failure with ATN resolving 4. Traumatic brain injury no change 5. Tracheostomy will remain in place   I have personally seen and evaluated the patient, evaluated laboratory and imaging results, formulated the assessment and plan and placed orders. The Patient requires high complexity decision making for assessment and support.  Case was discussed on Rounds with the Respiratory Therapy Staff  Allyne Gee, MD [facility] Pulmonary Critical Care Medicine Sleep Medicine

## 2019-09-20 DIAGNOSIS — N170 Acute kidney failure with tubular necrosis: Secondary | ICD-10-CM | POA: Diagnosis not present

## 2019-09-20 DIAGNOSIS — J9621 Acute and chronic respiratory failure with hypoxia: Secondary | ICD-10-CM | POA: Diagnosis not present

## 2019-09-20 DIAGNOSIS — Z930 Tracheostomy status: Secondary | ICD-10-CM | POA: Diagnosis not present

## 2019-09-20 DIAGNOSIS — J690 Pneumonitis due to inhalation of food and vomit: Secondary | ICD-10-CM | POA: Diagnosis not present

## 2019-09-20 NOTE — Progress Notes (Signed)
Pulmonary Critical Care Medicine [place]   PULMONARY CRITICAL CARE SERVICE  PROGRESS NOTE  Date of Service: 09/20/2019  [name]  XHB:[id number]  DOB: 13-Sep-1971   DOA: 09/10/2019  Referring Physician: Merton Border, MD  HPI: Joanne [name] is a 48 y.o. female seen for follow up of Acute on Chronic Respiratory Failure.  Patient is on T collar the goal today is for 16 hours  Medications: Reviewed on Rounds  Physical Exam:  Vitals: Temperature 97.9 pulse 79 respiratory rate 26 blood pressure 138/57 saturations 97%  Ventilator Settings off the ventilator on T collar with an FiO2 of 28%  . General: Comfortable at this time . Eyes: Grossly normal lids, irises & conjunctiva . ENT: grossly tongue is normal . Neck: no obvious mass . Cardiovascular: S1 S2 normal no gallop . Respiratory: No rhonchi no rales are noted at this time . Abdomen: soft . Skin: no rash seen on limited exam . Musculoskeletal: not rigid . Psychiatric:unable to assess . Neurologic: no seizure no involuntary movements         Lab Data:   Basic Metabolic Panel: Recent Labs  Lab 09/13/19 1423  MG 2.0    ABG: No results for input(s): PHART, PCO2ART, PO2ART, HCO3, O2SAT in the last 168 hours.  Liver Function Tests: No results for input(s): AST, ALT, ALKPHOS, BILITOT, PROT, ALBUMIN in the last 168 hours. No results for input(s): LIPASE, AMYLASE in the last 168 hours. No results for input(s): AMMONIA in the last 168 hours.  CBC: Recent Labs  Lab 09/16/19 0528  WBC 10.4  HGB 8.8*  HCT 28.1*  MCV 97.9  PLT 203    Cardiac Enzymes: No results for input(s): CKTOTAL, CKMB, CKMBINDEX, TROPONINI in the last 168 hours.  BNP (last 3 results) No results for input(s): BNP in the last 8760 hours.  ProBNP (last 3 results) No results for input(s): PROBNP in the last 8760 hours.  Radiological Exams: No results found.  Assessment/Plan Active Problems:   Acute on chronic  respiratory failure with hypoxia (HCC)   Tracheostomy status (HCC)   Aspiration pneumonia due to gastric secretions (HCC)   Acute kidney injury (AKI) with acute tubular necrosis (ATN) (HCC)   Traumatic brain injury ([employer])   1. Acute on chronic respiratory failure hypoxia plan is to continue with T collar for 16-hour goal 2. Tracheostomy remains in place 3. Aspiration pneumonia treated we will continue supportive care 4. Acute renal failure at baseline 5. Traumatic brain injury no changes we will continue present management   I have personally seen and evaluated the patient, evaluated laboratory and imaging results, formulated the assessment and plan and placed orders. The Patient requires high complexity decision making for assessment and support.  Case was discussed on Rounds with the Respiratory Therapy Staff  Allyne Gee, MD [facility] Pulmonary Critical Care Medicine Sleep Medicine

## 2019-09-21 DIAGNOSIS — J690 Pneumonitis due to inhalation of food and vomit: Secondary | ICD-10-CM | POA: Diagnosis not present

## 2019-09-21 DIAGNOSIS — Z930 Tracheostomy status: Secondary | ICD-10-CM | POA: Diagnosis not present

## 2019-09-21 DIAGNOSIS — J9621 Acute and chronic respiratory failure with hypoxia: Secondary | ICD-10-CM | POA: Diagnosis not present

## 2019-09-21 DIAGNOSIS — N170 Acute kidney failure with tubular necrosis: Secondary | ICD-10-CM | POA: Diagnosis not present

## 2019-09-21 NOTE — Progress Notes (Signed)
Pulmonary Critical Care Medicine Joanne [name]   PULMONARY CRITICAL CARE SERVICE  PROGRESS NOTE  Date of Service: 09/21/2019  Joanne [name]  GUR:[id number]  DOB: 06-05-1971   DOA: 09/10/2019  Referring Physician: Merton Border, MD  HPI: Joanne [name] is a 48 y.o. female seen for follow up of Acute on Chronic Respiratory Failure.  Patient remains on T collar has been on 28% FiO2 good saturations are noted at this time  Medications: Reviewed on Rounds  Physical Exam:  Vitals: Temperature is 98.1 pulse 79 respiratory 20 blood pressure 161/57 saturations 100%  Ventilator Settings mode of ventilation is spontaneous off the vent on T collar and the goal today is for 20 hours  . General: Comfortable at this time . Eyes: Grossly normal lids, irises & conjunctiva . ENT: grossly tongue is normal . Neck: no obvious mass . Cardiovascular: S1 S2 normal no gallop . Respiratory: No rhonchi coarse breath sounds are noted . Abdomen: soft . Skin: no rash seen on limited exam . Musculoskeletal: not rigid . Psychiatric:unable to assess . Neurologic: no seizure no involuntary movements         Lab Data:   Basic Metabolic Panel: No results for input(s): NA, K, CL, CO2, GLUCOSE, BUN, CREATININE, CALCIUM, MG, PHOS in the last 168 hours.  ABG: No results for input(s): PHART, PCO2ART, PO2ART, HCO3, O2SAT in the last 168 hours.  Liver Function Tests: No results for input(s): AST, ALT, ALKPHOS, BILITOT, PROT, ALBUMIN in the last 168 hours. No results for input(s): LIPASE, AMYLASE in the last 168 hours. No results for input(s): AMMONIA in the last 168 hours.  CBC: Recent Labs  Lab 09/16/19 0528  WBC 10.4  HGB 8.8*  HCT 28.1*  MCV 97.9  PLT 203    Cardiac Enzymes: No results for input(s): CKTOTAL, CKMB, CKMBINDEX, TROPONINI in the last 168 hours.  BNP (last 3 results) No results for input(s): BNP in the last 8760 hours.  ProBNP (last 3 results) No results for  input(s): PROBNP in the last 8760 hours.  Radiological Exams: No results found.  Assessment/Plan Active Problems:   Acute on chronic respiratory failure with hypoxia (HCC)   Tracheostomy status (HCC)   Aspiration pneumonia due to gastric secretions (HCC)   Acute kidney injury (AKI) with acute tubular necrosis (ATN) (HCC)   Traumatic brain injury ([place])   1. Acute on chronic respiratory failure with hypoxia we will continue with T collar FiO2 28% goal 20 hours 2. Aspiration pneumonia treated clinically improving 3. Acute renal failure resolved 4. Traumatic brain injury no change 5. Tracheostomy remains in place after weaning from the vent   I have personally seen and evaluated the patient, evaluated laboratory and imaging results, formulated the assessment and plan and placed orders. The Patient requires high complexity decision making for assessment and support.  Case was discussed on Rounds with the Respiratory Therapy Staff  Allyne Gee, MD [facility] Pulmonary Critical Care Medicine Sleep Medicine

## 2019-09-22 ENCOUNTER — Other Ambulatory Visit (HOSPITAL_COMMUNITY): Payer: Self-pay

## 2019-09-22 DIAGNOSIS — Z930 Tracheostomy status: Secondary | ICD-10-CM | POA: Diagnosis not present

## 2019-09-22 DIAGNOSIS — J690 Pneumonitis due to inhalation of food and vomit: Secondary | ICD-10-CM | POA: Diagnosis not present

## 2019-09-22 DIAGNOSIS — J9621 Acute and chronic respiratory failure with hypoxia: Secondary | ICD-10-CM | POA: Diagnosis not present

## 2019-09-22 DIAGNOSIS — N170 Acute kidney failure with tubular necrosis: Secondary | ICD-10-CM | POA: Diagnosis not present

## 2019-09-22 LAB — BASIC METABOLIC PANEL
Anion gap: 14 (ref 5–15)
BUN: 21 mg/dL — ABNORMAL HIGH (ref 6–20)
CO2: 27 mmol/L (ref 22–32)
Calcium: 9.3 mg/dL (ref 8.9–10.3)
Chloride: 98 mmol/L (ref 98–111)
Creatinine, Ser: 0.69 mg/dL (ref 0.44–1.00)
GFR calc Af Amer: >60 mL/min (ref >60)
GFR calc non Af Amer: >60 mL/min (ref >60)
Glucose, Bld: 125 mg/dL — ABNORMAL HIGH (ref 70–99)
Potassium: 4.3 mmol/L (ref 3.5–5.1)
Sodium: 139 mmol/L (ref 135–145)

## 2019-09-22 MED ORDER — GENERIC EXTERNAL MEDICATION
Status: DC
Start: ? — End: 2019-09-22

## 2019-09-22 NOTE — Progress Notes (Signed)
Pulmonary Critical Care Medicine [place]   PULMONARY CRITICAL CARE SERVICE  PROGRESS NOTE  Date of Service: 09/22/2019  Joanne [name]  DXI:[id number]  DOB: 1971-10-11   DOA: 09/10/2019  Referring Physician: Merton Border, MD  HPI: Joanne [name] is a 48 y.o. female seen for follow up of Acute on Chronic Respiratory Failure.  She is on pressure support ready for weaning today the goal will be 24 hours off the ventilator on T collar  Medications: Reviewed on Rounds  Physical Exam:  Vitals: Temperature 98.3 pulse 70 respiratory 26 blood pressure 103/61 saturations 98%  Ventilator Settings mode of ventilation pressure support FiO2 28% pressure support 12 PEEP 5  . General: Comfortable at this time . Eyes: Grossly normal lids, irises & conjunctiva . ENT: grossly tongue is normal . Neck: no obvious mass . Cardiovascular: S1 S2 normal no gallop . Respiratory: No rhonchi coarse breath sounds . Abdomen: soft . Skin: no rash seen on limited exam . Musculoskeletal: not rigid . Psychiatric:unable to assess . Neurologic: no seizure no involuntary movements         Lab Data:   Basic Metabolic Panel: Recent Labs  Lab 09/22/19 0557  NA 139  K 4.3  CL 98  CO2 27  GLUCOSE 125*  BUN 21*  CREATININE 0.69  CALCIUM 9.3    ABG: No results for input(s): PHART, PCO2ART, PO2ART, HCO3, O2SAT in the last 168 hours.  Liver Function Tests: No results for input(s): AST, ALT, ALKPHOS, BILITOT, PROT, ALBUMIN in the last 168 hours. No results for input(s): LIPASE, AMYLASE in the last 168 hours. No results for input(s): AMMONIA in the last 168 hours.  CBC: Recent Labs  Lab 09/16/19 0528  WBC 10.4  HGB 8.8*  HCT 28.1*  MCV 97.9  PLT 203    Cardiac Enzymes: No results for input(s): CKTOTAL, CKMB, CKMBINDEX, TROPONINI in the last 168 hours.  BNP (last 3 results) No results for input(s): BNP in the last 8760 hours.  ProBNP (last 3 results) No results  for input(s): PROBNP in the last 8760 hours.  Radiological Exams: Dg Chest Port 1 View  Result Date: 09/22/2019 CLINICAL DATA:  Respiratory failure. EXAM: PORTABLE CHEST 1 VIEW COMPARISON:  09/10/2019. FINDINGS: Tracheostomy tube and NG tube in good anatomic position. Heart size normal. Right upper lobe infiltrate. Lung volumes mild bibasilar subsegmental atelectasis. No pleural effusion or pneumothorax. Severe thoracolumbar spine scoliosis. Surgical clips right upper quadrant. IMPRESSION: 1.  Tracheostomy tube and NG tube in good anatomic position. 2. Right upper lobe infiltrate suggesting pneumonia. Low lung volumes with mild bibasilar subsegmental atelectasis. Electronically Signed   By: Marcello Moores  Register   On: 09/22/2019 06:56    Assessment/Plan Active Problems:   Acute on chronic respiratory failure with hypoxia ([place])   Tracheostomy status ([place])   Aspiration pneumonia due to gastric secretions (HCC)   Acute kidney injury (AKI) with acute tubular necrosis (ATN) ([place])   Traumatic brain injury ([place])   1. Acute on chronic respiratory failure hypoxia patient is going to wean for T collar 24-hour goal today 2. Aspiration pneumonia treated chest x-ray still showing some infiltrate need to follow to resolution 3. Acute renal failure continue to follow labs 4. Traumatic brain injury no change 5. Tracheostomy remains in place   I have personally seen and evaluated the patient, evaluated laboratory and imaging results, formulated the assessment and plan and placed orders. The Patient requires high complexity decision making for assessment and support.  Case  was discussed on Rounds with the Respiratory Therapy Staff  Allyne Gee, MD [facility] Pulmonary Critical Care Medicine Sleep Medicine

## 2019-09-23 DIAGNOSIS — J9621 Acute and chronic respiratory failure with hypoxia: Secondary | ICD-10-CM | POA: Diagnosis not present

## 2019-09-23 DIAGNOSIS — J690 Pneumonitis due to inhalation of food and vomit: Secondary | ICD-10-CM | POA: Diagnosis not present

## 2019-09-23 DIAGNOSIS — Z930 Tracheostomy status: Secondary | ICD-10-CM | POA: Diagnosis not present

## 2019-09-23 DIAGNOSIS — N170 Acute kidney failure with tubular necrosis: Secondary | ICD-10-CM | POA: Diagnosis not present

## 2019-09-23 NOTE — Progress Notes (Signed)
Pulmonary Critical Care Medicine [place]   PULMONARY CRITICAL CARE SERVICE  PROGRESS NOTE  Date of Service: 09/23/2019  Joanne [name]  ZHY:[id number]  DOB: 06/09/1971   DOA: 09/10/2019  Referring Physician: Merton Border, MD  HPI: Joanne [name] is a 48 y.o. female seen for follow up of Acute on Chronic Respiratory Failure.  Doing well on T collar has been off the ventilator today will be 48 hours  Medications: Reviewed on Rounds  Physical Exam:  Vitals: Temperature 98.2 pulse 82 respiratory 24 blood pressure 107/59 saturations 99%  Ventilator Settings off the vent on T collar FiO2 is 28%  . General: Comfortable at this time . Eyes: Grossly normal lids, irises & conjunctiva . ENT: grossly tongue is normal . Neck: no obvious mass . Cardiovascular: S1 S2 normal no gallop . Respiratory: No rhonchi no rales are noted at this time . Abdomen: soft . Skin: no rash seen on limited exam . Musculoskeletal: not rigid . Psychiatric:unable to assess . Neurologic: no seizure no involuntary movements         Lab Data:   Basic Metabolic Panel: Recent Labs  Lab 09/22/19 0557  NA 139  K 4.3  CL 98  CO2 27  GLUCOSE 125*  BUN 21*  CREATININE 0.69  CALCIUM 9.3    ABG: No results for input(s): PHART, PCO2ART, PO2ART, HCO3, O2SAT in the last 168 hours.  Liver Function Tests: No results for input(s): AST, ALT, ALKPHOS, BILITOT, PROT, ALBUMIN in the last 168 hours. No results for input(s): LIPASE, AMYLASE in the last 168 hours. No results for input(s): AMMONIA in the last 168 hours.  CBC: No results for input(s): WBC, NEUTROABS, HGB, HCT, MCV, PLT in the last 168 hours.  Cardiac Enzymes: No results for input(s): CKTOTAL, CKMB, CKMBINDEX, TROPONINI in the last 168 hours.  BNP (last 3 results) No results for input(s): BNP in the last 8760 hours.  ProBNP (last 3 results) No results for input(s): PROBNP in the last 8760 hours.  Radiological  Exams: Dg Chest Port 1 View  Result Date: 09/22/2019 CLINICAL DATA:  Respiratory failure. EXAM: PORTABLE CHEST 1 VIEW COMPARISON:  09/10/2019. FINDINGS: Tracheostomy tube and NG tube in good anatomic position. Heart size normal. Right upper lobe infiltrate. Lung volumes mild bibasilar subsegmental atelectasis. No pleural effusion or pneumothorax. Severe thoracolumbar spine scoliosis. Surgical clips right upper quadrant. IMPRESSION: 1.  Tracheostomy tube and NG tube in good anatomic position. 2. Right upper lobe infiltrate suggesting pneumonia. Low lung volumes with mild bibasilar subsegmental atelectasis. Electronically Signed   By: Marcello Moores  Register   On: 09/22/2019 06:56    Assessment/Plan Active Problems:   Acute on chronic respiratory failure with hypoxia ([place])   Tracheostomy status ([place])   Aspiration pneumonia due to gastric secretions (HCC)   Acute kidney injury (AKI) with acute tubular necrosis (ATN) ([place])   Traumatic brain injury ([place])   1. Acute on chronic respiratory failure with hypoxia plan is to continue with weaning on T collar patient will be going 48 hours today 2. Aspiration pneumonia treated we will continue supportive care 3. Acute renal failure we will continue to monitor labs 4. Traumatic brain injury no change 5. Tracheostomy will remain in place   I have personally seen and evaluated the patient, evaluated laboratory and imaging results, formulated the assessment and plan and placed orders. The Patient requires high complexity decision making for assessment and support.  Case was discussed on Rounds with the Respiratory Therapy Staff  Allyne Gee, MD [facility] Pulmonary Critical Care Medicine Sleep Medicine

## 2019-09-24 DIAGNOSIS — J9621 Acute and chronic respiratory failure with hypoxia: Secondary | ICD-10-CM | POA: Diagnosis not present

## 2019-09-24 DIAGNOSIS — Z930 Tracheostomy status: Secondary | ICD-10-CM | POA: Diagnosis not present

## 2019-09-24 DIAGNOSIS — J690 Pneumonitis due to inhalation of food and vomit: Secondary | ICD-10-CM | POA: Diagnosis not present

## 2019-09-24 DIAGNOSIS — N170 Acute kidney failure with tubular necrosis: Secondary | ICD-10-CM | POA: Diagnosis not present

## 2019-09-24 NOTE — Progress Notes (Signed)
Pulmonary Critical Care Medicine [place]   PULMONARY CRITICAL CARE SERVICE  PROGRESS NOTE  Date of Service: 09/24/2019  Joanne [name]  [id number]:[id number]  DOB: March 24, 1971   DOA: 09/10/2019  Referring Physician: Merton Border, MD  HPI: Joanne [name] is a 48 y.o. female seen for follow up of Acute on Chronic Respiratory Failure.  Patient currently is on T collar has been on 28% FiO2 good saturations are noted at this time off the ventilator for more than 48 hours  Medications: Reviewed on Rounds  Physical Exam:  Vitals: Temperature 98.4 pulse 81 respiratory 26 blood pressure 128/73 saturations 98%  Ventilator Settings on T collar FiO2 28%  . General: Comfortable at this time . Eyes: Grossly normal lids, irises & conjunctiva . ENT: grossly tongue is normal . Neck: no obvious mass . Cardiovascular: S1 S2 normal no gallop . Respiratory: No rhonchi coarse breath sounds are noted . Abdomen: soft . Skin: no rash seen on limited exam . Musculoskeletal: not rigid . Psychiatric:unable to assess . Neurologic: no seizure no involuntary movements         Lab Data:   Basic Metabolic Panel: Recent Labs  Lab 09/22/19 0557  NA 139  K 4.3  CL 98  CO2 27  GLUCOSE 125*  BUN 21*  CREATININE 0.69  CALCIUM 9.3    ABG: No results for input(s): PHART, PCO2ART, PO2ART, HCO3, O2SAT in the last 168 hours.  Liver Function Tests: No results for input(s): AST, ALT, ALKPHOS, BILITOT, PROT, ALBUMIN in the last 168 hours. No results for input(s): LIPASE, AMYLASE in the last 168 hours. No results for input(s): AMMONIA in the last 168 hours.  CBC: No results for input(s): WBC, NEUTROABS, HGB, HCT, MCV, PLT in the last 168 hours.  Cardiac Enzymes: No results for input(s): CKTOTAL, CKMB, CKMBINDEX, TROPONINI in the last 168 hours.  BNP (last 3 results) No results for input(s): BNP in the last 8760 hours.  ProBNP (last 3 results) No results for input(s): PROBNP in  the last 8760 hours.  Radiological Exams: No results found.  Assessment/Plan Active Problems:   Acute on chronic respiratory failure with hypoxia (HCC)   Tracheostomy status (HCC)   Aspiration pneumonia due to gastric secretions (HCC)   Acute kidney injury (AKI) with acute tubular necrosis (ATN) (HCC)   Traumatic brain injury ([place])   1. Acute on chronic respiratory failure hypoxia plan is to continue with T collar trials patient is on 20% FiO2 should be at baseline 2. Tracheostomy remains in place 3. Aspiration pneumonia treated we will continue with supportive care 4. Acute renal failure resolved 5. Traumatic brain injury no change   I have personally seen and evaluated the patient, evaluated laboratory and imaging results, formulated the assessment and plan and placed orders. The Patient requires high complexity decision making for assessment and support.  Case was discussed on Rounds with the Respiratory Therapy Staff  Allyne Gee, MD [facility] Pulmonary Critical Care Medicine Sleep Medicine

## 2019-09-25 DIAGNOSIS — J9621 Acute and chronic respiratory failure with hypoxia: Secondary | ICD-10-CM | POA: Diagnosis not present

## 2019-09-25 DIAGNOSIS — N170 Acute kidney failure with tubular necrosis: Secondary | ICD-10-CM | POA: Diagnosis not present

## 2019-09-25 DIAGNOSIS — J690 Pneumonitis due to inhalation of food and vomit: Secondary | ICD-10-CM | POA: Diagnosis not present

## 2019-09-25 DIAGNOSIS — Z930 Tracheostomy status: Secondary | ICD-10-CM | POA: Diagnosis not present

## 2019-09-25 NOTE — Progress Notes (Signed)
Pulmonary Critical Care Medicine [place]   PULMONARY CRITICAL CARE SERVICE  PROGRESS NOTE  Date of Service: 09/25/2019  VINISHA FAXON  [id number]  DOB: 05/31/1971   DOA: 09/10/2019  Referring Physician: Merton Border, MD  HPI: Joanne [name] is a 48 y.o. female seen for follow up of Acute on Chronic Respiratory Failure.  Patient is on T collar currently on 28% FiO2 has been off the ventilator for more than 72 hours  Medications: Reviewed on Rounds  Physical Exam:  Vitals: Temperature 97.9 pulse 82 respiratory rate 22 blood pressure 110/78 saturations 100%  Ventilator Settings off the ventilator on T collar FiO2 28%  . General: Comfortable at this time . Eyes: Grossly normal lids, irises & conjunctiva . ENT: grossly tongue is normal . Neck: no obvious mass . Cardiovascular: S1 S2 normal no gallop . Respiratory: No rhonchi no rales are noted . Abdomen: soft . Skin: no rash seen on limited exam . Musculoskeletal: not rigid . Psychiatric:unable to assess . Neurologic: no seizure no involuntary movements         Lab Data:   Basic Metabolic Panel: Recent Labs  Lab 09/22/19 0557  NA 139  K 4.3  CL 98  CO2 27  GLUCOSE 125*  BUN 21*  CREATININE 0.69  CALCIUM 9.3    ABG: No results for input(s): PHART, PCO2ART, PO2ART, HCO3, O2SAT in the last 168 hours.  Liver Function Tests: No results for input(s): AST, ALT, ALKPHOS, BILITOT, PROT, ALBUMIN in the last 168 hours. No results for input(s): LIPASE, AMYLASE in the last 168 hours. No results for input(s): AMMONIA in the last 168 hours.  CBC: No results for input(s): WBC, NEUTROABS, HGB, HCT, MCV, PLT in the last 168 hours.  Cardiac Enzymes: No results for input(s): CKTOTAL, CKMB, CKMBINDEX, TROPONINI in the last 168 hours.  BNP (last 3 results) No results for input(s): BNP in the last 8760 hours.  ProBNP (last 3 results) No results for input(s): PROBNP in the last 8760  hours.  Radiological Exams: No results found.  Assessment/Plan Active Problems:   Acute on chronic respiratory failure with hypoxia (HCC)   Tracheostomy status (HCC)   Aspiration pneumonia due to gastric secretions (HCC)   Acute kidney injury (AKI) with acute tubular necrosis (ATN) (HCC)   Traumatic brain injury ([place])   1. Acute on chronic respiratory failure hypoxia we will continue with weaning patient has been liberated from the ventilator at this point 2. Tracheostomy will remain in place 3. Aspiration pneumonia treated resolved 4. Acute renal failure resolved 5. Traumatic brain injury no change   I have personally seen and evaluated the patient, evaluated laboratory and imaging results, formulated the assessment and plan and placed orders. The Patient requires high complexity decision making for assessment and support.  Case was discussed on Rounds with the Respiratory Therapy Staff  Allyne Gee, MD [facility] Pulmonary Critical Care Medicine Sleep Medicine

## 2019-09-26 DIAGNOSIS — J690 Pneumonitis due to inhalation of food and vomit: Secondary | ICD-10-CM | POA: Diagnosis not present

## 2019-09-26 DIAGNOSIS — Z930 Tracheostomy status: Secondary | ICD-10-CM | POA: Diagnosis not present

## 2019-09-26 DIAGNOSIS — N170 Acute kidney failure with tubular necrosis: Secondary | ICD-10-CM | POA: Diagnosis not present

## 2019-09-26 DIAGNOSIS — J9621 Acute and chronic respiratory failure with hypoxia: Secondary | ICD-10-CM | POA: Diagnosis not present

## 2019-09-26 NOTE — Progress Notes (Signed)
Pulmonary Critical Care Medicine [place]   PULMONARY CRITICAL CARE SERVICE  PROGRESS NOTE  Date of Service: 09/26/2019  Joanne [name]  [id number]  DOB: [birth year]   DOA: 09/10/2019  Referring Physician: Merton Border, MD  HPI: DEMECIA [name] is a 48 y.o. female seen for follow up of Acute on Chronic Respiratory Failure.  Patient is currently on T collar has been on 28% FiO2 successfully weaned off the ventilator  Medications: Reviewed on Rounds  Physical Exam:  Vitals: Temperature 97.8 pulse 82 respiratory 27 blood pressure 123/69 saturations 96%  Ventilator Settings off the ventilator on T collar with a 28% FiO2  . General: Comfortable at this time . Eyes: Grossly normal lids, irises & conjunctiva . ENT: grossly tongue is normal . Neck: no obvious mass . Cardiovascular: S1 S2 normal no gallop . Respiratory: No rhonchi coarse breath sounds . Abdomen: soft . Skin: no rash seen on limited exam . Musculoskeletal: not rigid . Psychiatric:unable to assess . Neurologic: no seizure no involuntary movements         Lab Data:   Basic Metabolic Panel: Recent Labs  Lab 09/22/19 0557  NA 139  K 4.3  CL 98  CO2 27  GLUCOSE 125*  BUN 21*  CREATININE 0.69  CALCIUM 9.3    ABG: No results for input(s): PHART, PCO2ART, PO2ART, HCO3, O2SAT in the last 168 hours.  Liver Function Tests: No results for input(s): AST, ALT, ALKPHOS, BILITOT, PROT, ALBUMIN in the last 168 hours. No results for input(s): LIPASE, AMYLASE in the last 168 hours. No results for input(s): AMMONIA in the last 168 hours.  CBC: No results for input(s): WBC, NEUTROABS, HGB, HCT, MCV, PLT in the last 168 hours.  Cardiac Enzymes: No results for input(s): CKTOTAL, CKMB, CKMBINDEX, TROPONINI in the last 168 hours.  BNP (last 3 results) No results for input(s): BNP in the last 8760 hours.  ProBNP (last 3 results) No results for input(s): PROBNP in the last 8760  hours.  Radiological Exams: No results found.  Assessment/Plan Active Problems:   Acute on chronic respiratory failure with hypoxia (HCC)   Tracheostomy status (HCC)   Aspiration pneumonia due to gastric secretions (HCC)   Acute kidney injury (AKI) with acute tubular necrosis (ATN) (HCC)   Traumatic brain injury ([place])   1. Acute on chronic respiratory failure hypoxia plan is to continue with T collar at baseline patient will likely not be decannulated 2. Tracheostomy remains in place 3. Aspiration pneumonia treated 4. Acute kidney injury at baseline 5. Traumatic brain injury no change we will continue with supportive care   I have personally seen and evaluated the patient, evaluated laboratory and imaging results, formulated the assessment and plan and placed orders. The Patient requires high complexity decision making for assessment and support.  Case was discussed on Rounds with the Respiratory Therapy Staff  Allyne Gee, MD [facility] Pulmonary Critical Care Medicine Sleep Medicine

## 2019-09-27 DIAGNOSIS — J690 Pneumonitis due to inhalation of food and vomit: Secondary | ICD-10-CM | POA: Diagnosis not present

## 2019-09-27 DIAGNOSIS — N170 Acute kidney failure with tubular necrosis: Secondary | ICD-10-CM | POA: Diagnosis not present

## 2019-09-27 DIAGNOSIS — J9621 Acute and chronic respiratory failure with hypoxia: Secondary | ICD-10-CM | POA: Diagnosis not present

## 2019-09-27 DIAGNOSIS — Z930 Tracheostomy status: Secondary | ICD-10-CM | POA: Diagnosis not present

## 2019-09-27 NOTE — Progress Notes (Signed)
Pulmonary Critical Care Medicine [place]   PULMONARY CRITICAL CARE SERVICE  PROGRESS NOTE  Date of Service: 09/27/2019  ERICCA LABRA  TML:[id number]  DOB: [birth year]   DOA: 09/10/2019  Referring Physician: Merton Border, MD  HPI: KAMELA BLANSETT is a 48 y.o. female seen for follow up of Acute on Chronic Respiratory Failure.  She is liberated from the ventilator on 28% FiO2 T collar  Medications: Reviewed on Rounds  Physical Exam:  Vitals: Temperature 96.7 pulse 93 respiratory 25 blood pressure 144/69 saturations 100%  Ventilator Settings on T collar FiO2 is 28% with the PMV in place  . General: Comfortable at this time . Eyes: Grossly normal lids, irises & conjunctiva . ENT: grossly tongue is normal . Neck: no obvious mass . Cardiovascular: S1 S2 normal no gallop . Respiratory: No rhonchi coarse breath sounds . Abdomen: soft . Skin: no rash seen on limited exam . Musculoskeletal: not rigid . Psychiatric:unable to assess . Neurologic: no seizure no involuntary movements         Lab Data:   Basic Metabolic Panel: Recent Labs  Lab 09/22/19 0557  NA 139  K 4.3  CL 98  CO2 27  GLUCOSE 125*  BUN 21*  CREATININE 0.69  CALCIUM 9.3    ABG: No results for input(s): PHART, PCO2ART, PO2ART, HCO3, O2SAT in the last 168 hours.  Liver Function Tests: No results for input(s): AST, ALT, ALKPHOS, BILITOT, PROT, ALBUMIN in the last 168 hours. No results for input(s): LIPASE, AMYLASE in the last 168 hours. No results for input(s): AMMONIA in the last 168 hours.  CBC: No results for input(s): WBC, NEUTROABS, HGB, HCT, MCV, PLT in the last 168 hours.  Cardiac Enzymes: No results for input(s): CKTOTAL, CKMB, CKMBINDEX, TROPONINI in the last 168 hours.  BNP (last 3 results) No results for input(s): BNP in the last 8760 hours.  ProBNP (last 3 results) No results for input(s): PROBNP in the last 8760 hours.  Radiological Exams: No results  found.  Assessment/Plan Active Problems:   Acute on chronic respiratory failure with hypoxia (HCC)   Tracheostomy status (HCC)   Aspiration pneumonia due to gastric secretions (HCC)   Acute kidney injury (AKI) with acute tubular necrosis (ATN) (HCC)   Traumatic brain injury ([place])   1. Acute on chronic respiratory failure hypoxia patient continues on T collar currently on 28% FiO2 using the PMV also 2. Tracheostomy will remain in place 3. Aspiration pneumonia treated improving 4. Acute renal failure labs improved 5. Traumatic brain injury no change we will monitor   I have personally seen and evaluated the patient, evaluated laboratory and imaging results, formulated the assessment and plan and placed orders. The Patient requires high complexity decision making for assessment and support.  Case was discussed on Rounds with the Respiratory Therapy Staff  Allyne Gee, MD [facility] Pulmonary Critical Care Medicine Sleep Medicine

## 2019-09-28 DIAGNOSIS — N170 Acute kidney failure with tubular necrosis: Secondary | ICD-10-CM | POA: Diagnosis not present

## 2019-09-28 DIAGNOSIS — J9621 Acute and chronic respiratory failure with hypoxia: Secondary | ICD-10-CM | POA: Diagnosis not present

## 2019-09-28 DIAGNOSIS — Z930 Tracheostomy status: Secondary | ICD-10-CM | POA: Diagnosis not present

## 2019-09-28 DIAGNOSIS — J690 Pneumonitis due to inhalation of food and vomit: Secondary | ICD-10-CM | POA: Diagnosis not present

## 2019-09-28 NOTE — Progress Notes (Signed)
Pulmonary Critical Care Medicine [place]   PULMONARY CRITICAL CARE SERVICE  PROGRESS NOTE  Date of Service: 09/28/2019  Joanne [name]  [id number]:[id number]  DOB: May 05, 1971   DOA: 09/10/2019  Referring Physician: Merton Border, MD  HPI: Joanne [name] is a 48 y.o. female seen for follow up of Acute on Chronic Respiratory Failure.  Patient currently is on T collar has been on 20% FiO2 using PMV now doing fairly well  Medications: Reviewed on Rounds  Physical Exam:  Vitals: Temperature 97.9 pulse 79 respiratory 21 blood pressure 133/71 saturations 100%  Ventilator Settings off the ventilator on T collar FiO2 28%  . General: Comfortable at this time . Eyes: Grossly normal lids, irises & conjunctiva . ENT: grossly tongue is normal . Neck: no obvious mass . Cardiovascular: S1 S2 normal no gallop . Respiratory: No rhonchi no rales are noted at this time . Abdomen: soft . Skin: no rash seen on limited exam . Musculoskeletal: not rigid . Psychiatric:unable to assess . Neurologic: no seizure no involuntary movements         Lab Data:   Basic Metabolic Panel: Recent Labs  Lab 09/22/19 0557  NA 139  K 4.3  CL 98  CO2 27  GLUCOSE 125*  BUN 21*  CREATININE 0.69  CALCIUM 9.3    ABG: No results for input(s): PHART, PCO2ART, PO2ART, HCO3, O2SAT in the last 168 hours.  Liver Function Tests: No results for input(s): AST, ALT, ALKPHOS, BILITOT, PROT, ALBUMIN in the last 168 hours. No results for input(s): LIPASE, AMYLASE in the last 168 hours. No results for input(s): AMMONIA in the last 168 hours.  CBC: No results for input(s): WBC, NEUTROABS, HGB, HCT, MCV, PLT in the last 168 hours.  Cardiac Enzymes: No results for input(s): CKTOTAL, CKMB, CKMBINDEX, TROPONINI in the last 168 hours.  BNP (last 3 results) No results for input(s): BNP in the last 8760 hours.  ProBNP (last 3 results) No results for input(s): PROBNP in the last 8760  hours.  Radiological Exams: No results found.  Assessment/Plan Active Problems:   Acute on chronic respiratory failure with hypoxia (HCC)   Tracheostomy status (HCC)   Aspiration pneumonia due to gastric secretions (HCC)   Acute kidney injury (AKI) with acute tubular necrosis (ATN) (HCC)   Traumatic brain injury ([place])   1. Acute on chronic respiratory failure hypoxia we will continue T collar trials titrate oxygen continue pulmonary toilet 2. Tracheostomy remains in place 3. Aspiration pneumonia treated continue with supportive care 4. Acute renal failure resolved 5. Traumatic brain injury no change   I have personally seen and evaluated the patient, evaluated laboratory and imaging results, formulated the assessment and plan and placed orders. The Patient requires high complexity decision making for assessment and support.  Case was discussed on Rounds with the Respiratory Therapy Staff  Allyne Gee, MD [facility] Pulmonary Critical Care Medicine Sleep Medicine

## 2019-09-29 DIAGNOSIS — J9621 Acute and chronic respiratory failure with hypoxia: Secondary | ICD-10-CM | POA: Diagnosis not present

## 2019-09-29 DIAGNOSIS — Z930 Tracheostomy status: Secondary | ICD-10-CM | POA: Diagnosis not present

## 2019-09-29 DIAGNOSIS — N170 Acute kidney failure with tubular necrosis: Secondary | ICD-10-CM | POA: Diagnosis not present

## 2019-09-29 DIAGNOSIS — J690 Pneumonitis due to inhalation of food and vomit: Secondary | ICD-10-CM | POA: Diagnosis not present

## 2019-09-29 LAB — BASIC METABOLIC PANEL
Anion gap: 11 (ref 5–15)
BUN: 21 mg/dL — ABNORMAL HIGH (ref 6–20)
CO2: 24 mmol/L (ref 22–32)
Calcium: 9.2 mg/dL (ref 8.9–10.3)
Chloride: 105 mmol/L (ref 98–111)
Creatinine, Ser: 0.67 mg/dL (ref 0.44–1.00)
GFR calc Af Amer: >60 mL/min (ref >60)
GFR calc non Af Amer: >60 mL/min (ref >60)
Glucose, Bld: 100 mg/dL — ABNORMAL HIGH (ref 70–99)
Potassium: 3.9 mmol/L (ref 3.5–5.1)
Sodium: 140 mmol/L (ref 135–145)

## 2019-09-29 LAB — CARBAMAZEPINE LEVEL, TOTAL: Carbamazepine Lvl: 5.5 ug/mL (ref 4.0–12.0)

## 2019-09-29 LAB — MAGNESIUM: Magnesium: 1.7 mg/dL (ref 1.7–2.4)

## 2019-09-29 LAB — CBC
HCT: 32.4 % — ABNORMAL LOW (ref 36.0–46.0)
Hemoglobin: 9.8 g/dL — ABNORMAL LOW (ref 12.0–15.0)
MCH: 29.4 pg (ref 26.0–34.0)
MCHC: 30.2 g/dL (ref 30.0–36.0)
MCV: 97.3 fL (ref 80.0–100.0)
Platelets: UNDETERMINED 10*3/uL (ref 150–400)
RBC: 3.33 MIL/uL — ABNORMAL LOW (ref 3.87–5.11)
RDW: 12.8 % (ref 11.5–15.5)
WBC: 8.9 10*3/uL (ref 4.0–10.5)
nRBC: 0 % (ref 0.0–0.2)

## 2019-09-29 NOTE — Progress Notes (Signed)
Pulmonary Critical Care Medicine [place]   PULMONARY CRITICAL CARE SERVICE  PROGRESS NOTE  Date of Service: 09/29/2019  Joanne [name]  [id number]:[id number]  DOB: Apr 25, 1971   DOA: 09/10/2019  Referring Physician: Merton Border, MD  HPI: Joanne [name] is a 48 y.o. female seen for follow up of Acute on Chronic Respiratory Failure.  Patient currently is on T collar has been on 20% FiO2 with good saturations  Medications: Reviewed on Rounds  Physical Exam:  Vitals: Temperature 97.4 pulse 75 respiratory 21 blood pressure 131/80 saturations 100%  Ventilator Settings off the ventilator on T collar currently on 28% FiO2  . General: Comfortable at this time . Eyes: Grossly normal lids, irises & conjunctiva . ENT: grossly tongue is normal . Neck: no obvious mass . Cardiovascular: S1 S2 normal no gallop . Respiratory: No rhonchi bilaterally . Abdomen: soft . Skin: no rash seen on limited exam . Musculoskeletal: not rigid . Psychiatric:unable to assess . Neurologic: no seizure no involuntary movements         Lab Data:   Basic Metabolic Panel: Recent Labs  Lab 09/29/19 0555  NA 140  K 3.9  CL 105  CO2 24  GLUCOSE 100*  BUN 21*  CREATININE 0.67  CALCIUM 9.2  MG 1.7    ABG: No results for input(s): PHART, PCO2ART, PO2ART, HCO3, O2SAT in the last 168 hours.  Liver Function Tests: No results for input(s): AST, ALT, ALKPHOS, BILITOT, PROT, ALBUMIN in the last 168 hours. No results for input(s): LIPASE, AMYLASE in the last 168 hours. No results for input(s): AMMONIA in the last 168 hours.  CBC: Recent Labs  Lab 09/29/19 0555  WBC 8.9  HGB 9.8*  HCT 32.4*  MCV 97.3  PLT PLATELET CLUMPS NOTED ON SMEAR, UNABLE TO ESTIMATE    Cardiac Enzymes: No results for input(s): CKTOTAL, CKMB, CKMBINDEX, TROPONINI in the last 168 hours.  BNP (last 3 results) No results for input(s): BNP in the last 8760 hours.  ProBNP (last 3 results) No results for  input(s): PROBNP in the last 8760 hours.  Radiological Exams: No results found.  Assessment/Plan Active Problems:   Acute on chronic respiratory failure with hypoxia (HCC)   Tracheostomy status (HCC)   Aspiration pneumonia due to gastric secretions (HCC)   Acute kidney injury (AKI) with acute tubular necrosis (ATN) (HCC)   Traumatic brain injury ([place])   1. Acute on chronic respiratory failure hypoxia patient continues on T collar has been on 28% FiO2 with good saturations. 2. Aspiration pneumonia treated we will continue with supportive care 3. Acute renal failure with ATN improving 4. Traumatic brain injury no change 5. Tracheostomy remains in place   I have personally seen and evaluated the patient, evaluated laboratory and imaging results, formulated the assessment and plan and placed orders. The Patient requires high complexity decision making for assessment and support.  Case was discussed on Rounds with the Respiratory Therapy Staff  Allyne Gee, MD [facility] Pulmonary Critical Care Medicine Sleep Medicine

## 2019-09-30 DIAGNOSIS — J690 Pneumonitis due to inhalation of food and vomit: Secondary | ICD-10-CM | POA: Diagnosis not present

## 2019-09-30 DIAGNOSIS — J9621 Acute and chronic respiratory failure with hypoxia: Secondary | ICD-10-CM | POA: Diagnosis not present

## 2019-09-30 DIAGNOSIS — N170 Acute kidney failure with tubular necrosis: Secondary | ICD-10-CM | POA: Diagnosis not present

## 2019-09-30 DIAGNOSIS — Z930 Tracheostomy status: Secondary | ICD-10-CM | POA: Diagnosis not present

## 2019-09-30 LAB — MAGNESIUM: Magnesium: 1.9 mg/dL (ref 1.7–2.4)

## 2019-09-30 NOTE — Progress Notes (Signed)
Pulmonary Critical Care Medicine [place]   PULMONARY CRITICAL CARE SERVICE  PROGRESS NOTE  Date of Service: 09/30/2019  Joanne [name]  VQM:[id number]  DOB: 07-19-1971   DOA: 09/10/2019  Referring Physician: Merton Border, MD  HPI: Joanne [name] is a 48 y.o. female seen for follow up of Acute on Chronic Respiratory Failure.  Patient currently is on T collar has been on 20% FiO2 with good saturations noted.  Medications: Reviewed on Rounds  Physical Exam:  Vitals: Temperature 96.6 pulse 74 respiratory 18 blood pressure 137/78 saturations 100%  Ventilator Settings off the ventilator on T collar currently on 28% FiO2  . General: Comfortable at this time . Eyes: Grossly normal lids, irises & conjunctiva . ENT: grossly tongue is normal . Neck: no obvious mass . Cardiovascular: S1 S2 normal no gallop . Respiratory: No rhonchi no rales are noted at this time . Abdomen: soft . Skin: no rash seen on limited exam . Musculoskeletal: not rigid . Psychiatric:unable to assess . Neurologic: no seizure no involuntary movements         Lab Data:   Basic Metabolic Panel: Recent Labs  Lab 09/29/19 0555  NA 140  K 3.9  CL 105  CO2 24  GLUCOSE 100*  BUN 21*  CREATININE 0.67  CALCIUM 9.2  MG 1.7    ABG: No results for input(s): PHART, PCO2ART, PO2ART, HCO3, O2SAT in the last 168 hours.  Liver Function Tests: No results for input(s): AST, ALT, ALKPHOS, BILITOT, PROT, ALBUMIN in the last 168 hours. No results for input(s): LIPASE, AMYLASE in the last 168 hours. No results for input(s): AMMONIA in the last 168 hours.  CBC: Recent Labs  Lab 09/29/19 0555  WBC 8.9  HGB 9.8*  HCT 32.4*  MCV 97.3  PLT PLATELET CLUMPS NOTED ON SMEAR, UNABLE TO ESTIMATE    Cardiac Enzymes: No results for input(s): CKTOTAL, CKMB, CKMBINDEX, TROPONINI in the last 168 hours.  BNP (last 3 results) No results for input(s): BNP in the last 8760 hours.  ProBNP (last 3  results) No results for input(s): PROBNP in the last 8760 hours.  Radiological Exams: No results found.  Assessment/Plan Active Problems:   Acute on chronic respiratory failure with hypoxia (HCC)   Tracheostomy status (HCC)   Aspiration pneumonia due to gastric secretions (HCC)   Acute kidney injury (AKI) with acute tubular necrosis (ATN) (HCC)   Traumatic brain injury ([place])   1. Acute on chronic respiratory failure with hypoxia we will continue with T collar currently is on 28% FiO2 continue secretion management pulmonary toilet 2. Aspiration pneumonia treated we will continue supportive care 3. Tracheostomy will be changed over to a cuffless #6 4. Acute renal failure resolved 5. Traumatic brain injury no change   I have personally seen and evaluated the patient, evaluated laboratory and imaging results, formulated the assessment and plan and placed orders. The Patient requires high complexity decision making for assessment and support.  Case was discussed on Rounds with the Respiratory Therapy Staff  Allyne Gee, MD [facility] Pulmonary Critical Care Medicine Sleep Medicine

## 2019-10-01 DIAGNOSIS — Z930 Tracheostomy status: Secondary | ICD-10-CM | POA: Diagnosis not present

## 2019-10-01 DIAGNOSIS — J9620 Acute and chronic respiratory failure, unspecified whether with hypoxia or hypercapnia: Secondary | ICD-10-CM | POA: Diagnosis not present

## 2019-10-01 DIAGNOSIS — J690 Pneumonitis due to inhalation of food and vomit: Secondary | ICD-10-CM | POA: Diagnosis not present

## 2019-10-01 DIAGNOSIS — N170 Acute kidney failure with tubular necrosis: Secondary | ICD-10-CM | POA: Diagnosis not present

## 2019-10-01 DIAGNOSIS — J9621 Acute and chronic respiratory failure with hypoxia: Secondary | ICD-10-CM | POA: Diagnosis not present

## 2019-10-01 NOTE — Progress Notes (Signed)
Pulmonary Critical Care Medicine [place]   PULMONARY CRITICAL CARE SERVICE  PROGRESS NOTE  Date of Service: 10/01/2019  Joanne [name]  XNA:[id number]  DOB: [birth year]   DOA: 09/10/2019  Referring Physician: Merton Border, MD  HPI: Joanne [name] is a 48 y.o. female seen for follow up of Acute on Chronic Respiratory Failure.  Patient currently is on T collar has been on 28% FiO2 with good saturations  Medications: Reviewed on Rounds  Physical Exam:  Vitals: Temperature 98.6 pulse 77 respiratory rate 21 blood pressure 150/92 saturations 100%  Ventilator Settings off the ventilator on T collar currently on 28% FiO2  . General: Comfortable at this time . Eyes: Grossly normal lids, irises & conjunctiva . ENT: grossly tongue is normal . Neck: no obvious mass . Cardiovascular: S1 S2 normal no gallop . Respiratory: No rhonchi no rales are noted at this time . Abdomen: soft . Skin: no rash seen on limited exam . Musculoskeletal: not rigid . Psychiatric:unable to assess . Neurologic: no seizure no involuntary movements         Lab Data:   Basic Metabolic Panel: Recent Labs  Lab 09/29/19 0555 09/30/19 1020  NA 140  --   K 3.9  --   CL 105  --   CO2 24  --   GLUCOSE 100*  --   BUN 21*  --   CREATININE 0.67  --   CALCIUM 9.2  --   MG 1.7 1.9    ABG: No results for input(s): PHART, PCO2ART, PO2ART, HCO3, O2SAT in the last 168 hours.  Liver Function Tests: No results for input(s): AST, ALT, ALKPHOS, BILITOT, PROT, ALBUMIN in the last 168 hours. No results for input(s): LIPASE, AMYLASE in the last 168 hours. No results for input(s): AMMONIA in the last 168 hours.  CBC: Recent Labs  Lab 09/29/19 0555  WBC 8.9  HGB 9.8*  HCT 32.4*  MCV 97.3  PLT PLATELET CLUMPS NOTED ON SMEAR, UNABLE TO ESTIMATE    Cardiac Enzymes: No results for input(s): CKTOTAL, CKMB, CKMBINDEX, TROPONINI in the last 168 hours.  BNP (last 3 results) No results for  input(s): BNP in the last 8760 hours.  ProBNP (last 3 results) No results for input(s): PROBNP in the last 8760 hours.  Radiological Exams: No results found.  Assessment/Plan Active Problems:   Acute on chronic respiratory failure with hypoxia (HCC)   Tracheostomy status (HCC)   Aspiration pneumonia due to gastric secretions (HCC)   Acute kidney injury (AKI) with acute tubular necrosis (ATN) (HCC)   Traumatic brain injury ([place])   1. Acute on chronic respiratory failure hypoxia plan is to continue with T collar trials patient is on 28% FiO2 saturations are excellent 2. Tracheostomy remains placed 3. Aspiration pneumonia treated we will continue with supportive care 4. Acute renal failure due to tubular necrosis follow labs 5. Traumatic brain injury no change   I have personally seen and evaluated the patient, evaluated laboratory and imaging results, formulated the assessment and plan and placed orders. The Patient requires high complexity decision making for assessment and support.  Case was discussed on Rounds with the Respiratory Therapy Staff  Allyne Gee, MD [facility] Pulmonary Critical Care Medicine Sleep Medicine

## 2019-10-02 DIAGNOSIS — N170 Acute kidney failure with tubular necrosis: Secondary | ICD-10-CM | POA: Diagnosis not present

## 2019-10-02 DIAGNOSIS — J9621 Acute and chronic respiratory failure with hypoxia: Secondary | ICD-10-CM | POA: Diagnosis not present

## 2019-10-02 DIAGNOSIS — Z930 Tracheostomy status: Secondary | ICD-10-CM | POA: Diagnosis not present

## 2019-10-02 DIAGNOSIS — J690 Pneumonitis due to inhalation of food and vomit: Secondary | ICD-10-CM | POA: Diagnosis not present

## 2019-10-02 NOTE — Progress Notes (Signed)
Pulmonary Critical Care Medicine [place]   PULMONARY CRITICAL CARE SERVICE  PROGRESS NOTE  Date of Service: 10/02/2019  BETHSAIDA SIEGENTHALER  MPN:[id number]  DOB: 14-May-1971   DOA: 09/10/2019  Referring Physician: Merton Border, MD  HPI: Joanne [name] is a 48 y.o. female seen for follow up of Acute on Chronic Respiratory Failure.  Patient currently is on 28% FiO2 has been on T collar no distress is noted at this time  Medications: Reviewed on Rounds  Physical Exam:  Vitals: Temperature 97.8 pulse 79 respiratory rate 23 blood pressure 127/74 saturations 98%  Ventilator Settings off the ventilator right now on T collar with an FiO2 of 28%  . General: Comfortable at this time . Eyes: Grossly normal lids, irises & conjunctiva . ENT: grossly tongue is normal . Neck: no obvious mass . Cardiovascular: S1 S2 normal no gallop . Respiratory: No rhonchi coarse breath sounds are noted . Abdomen: soft . Skin: no rash seen on limited exam . Musculoskeletal: not rigid . Psychiatric:unable to assess . Neurologic: no seizure no involuntary movements         Lab Data:   Basic Metabolic Panel: Recent Labs  Lab 09/29/19 0555 09/30/19 1020  NA 140  --   K 3.9  --   CL 105  --   CO2 24  --   GLUCOSE 100*  --   BUN 21*  --   CREATININE 0.67  --   CALCIUM 9.2  --   MG 1.7 1.9    ABG: No results for input(s): PHART, PCO2ART, PO2ART, HCO3, O2SAT in the last 168 hours.  Liver Function Tests: No results for input(s): AST, ALT, ALKPHOS, BILITOT, PROT, ALBUMIN in the last 168 hours. No results for input(s): LIPASE, AMYLASE in the last 168 hours. No results for input(s): AMMONIA in the last 168 hours.  CBC: Recent Labs  Lab 09/29/19 0555  WBC 8.9  HGB 9.8*  HCT 32.4*  MCV 97.3  PLT PLATELET CLUMPS NOTED ON SMEAR, UNABLE TO ESTIMATE    Cardiac Enzymes: No results for input(s): CKTOTAL, CKMB, CKMBINDEX, TROPONINI in the last 168 hours.  BNP (last 3  results) No results for input(s): BNP in the last 8760 hours.  ProBNP (last 3 results) No results for input(s): PROBNP in the last 8760 hours.  Radiological Exams: No results found.  Assessment/Plan Active Problems:   Acute on chronic respiratory failure with hypoxia (HCC)   Tracheostomy status (HCC)   Aspiration pneumonia due to gastric secretions (HCC)   Acute kidney injury (AKI) with acute tubular necrosis (ATN) (HCC)   Traumatic brain injury ([place])   1. Acute on chronic respiratory failure hypoxia patient will be continued on T collar she is at her baseline 2. Tracheostomy remains in place 3. Aspiration pneumonia treated we will continue to follow 4. Acute renal failure follow labs 5. Traumatic brain injury no change   I have personally seen and evaluated the patient, evaluated laboratory and imaging results, formulated the assessment and plan and placed orders. The Patient requires high complexity decision making for assessment and support.  Case was discussed on Rounds with the Respiratory Therapy Staff  Allyne Gee, MD [facility] Pulmonary Critical Care Medicine Sleep Medicine

## 2019-10-03 DIAGNOSIS — J690 Pneumonitis due to inhalation of food and vomit: Secondary | ICD-10-CM | POA: Diagnosis not present

## 2019-10-03 DIAGNOSIS — N170 Acute kidney failure with tubular necrosis: Secondary | ICD-10-CM | POA: Diagnosis not present

## 2019-10-03 DIAGNOSIS — Z930 Tracheostomy status: Secondary | ICD-10-CM | POA: Diagnosis not present

## 2019-10-03 DIAGNOSIS — J9621 Acute and chronic respiratory failure with hypoxia: Secondary | ICD-10-CM | POA: Diagnosis not present

## 2019-10-03 NOTE — Progress Notes (Signed)
Pulmonary Critical Care Medicine [place]   PULMONARY CRITICAL CARE SERVICE  PROGRESS NOTE  Date of Service: 10/03/2019  Joanne [name]  OZD:[id number]  DOB: [birth year]   DOA: 09/10/2019  Referring Physician: Merton Border, MD  HPI: Joanne [name] is a 48 y.o. female seen for follow up of Acute on Chronic Respiratory Failure.  Patient is comfortable right now without distress at this time has been on T collar with an FiO2 of 28%  Medications: Reviewed on Rounds  Physical Exam:  Vitals: Temperature 97.6 pulse 84 respiratory 20 blood pressure 137/84 saturations are 98%  Ventilator Settings off the vent on T collar FiO2 28%  . General: Comfortable at this time . Eyes: Grossly normal lids, irises & conjunctiva . ENT: grossly tongue is normal . Neck: no obvious mass . Cardiovascular: S1 S2 normal no gallop . Respiratory: No rhonchi no rales noted . Abdomen: soft . Skin: no rash seen on limited exam . Musculoskeletal: not rigid . Psychiatric:unable to assess . Neurologic: no seizure no involuntary movements         Lab Data:   Basic Metabolic Panel: Recent Labs  Lab 09/29/19 0555 09/30/19 1020  NA 140  --   K 3.9  --   CL 105  --   CO2 24  --   GLUCOSE 100*  --   BUN 21*  --   CREATININE 0.67  --   CALCIUM 9.2  --   MG 1.7 1.9    ABG: No results for input(s): PHART, PCO2ART, PO2ART, HCO3, O2SAT in the last 168 hours.  Liver Function Tests: No results for input(s): AST, ALT, ALKPHOS, BILITOT, PROT, ALBUMIN in the last 168 hours. No results for input(s): LIPASE, AMYLASE in the last 168 hours. No results for input(s): AMMONIA in the last 168 hours.  CBC: Recent Labs  Lab 09/29/19 0555  WBC 8.9  HGB 9.8*  HCT 32.4*  MCV 97.3  PLT PLATELET CLUMPS NOTED ON SMEAR, UNABLE TO ESTIMATE    Cardiac Enzymes: No results for input(s): CKTOTAL, CKMB, CKMBINDEX, TROPONINI in the last 168 hours.  BNP (last 3 results) No results for input(s):  BNP in the last 8760 hours.  ProBNP (last 3 results) No results for input(s): PROBNP in the last 8760 hours.  Radiological Exams: No results found.  Assessment/Plan Active Problems:   Acute on chronic respiratory failure with hypoxia (HCC)   Tracheostomy status (HCC)   Aspiration pneumonia due to gastric secretions (HCC)   Acute kidney injury (AKI) with acute tubular necrosis (ATN) (HCC)   Traumatic brain injury ([place])   1. Acute on chronic respiratory failure hypoxia plan continue with to follow patient is at baseline settings. 2. Tracheostomy remains in place we will continue with present management 3. Aspiration pneumonia treated resolved 4. Acute renal failure resolved 5. Traumatic brain injury no change we will continue present management   I have personally seen and evaluated the patient, evaluated laboratory and imaging results, formulated the assessment and plan and placed orders. The Patient requires high complexity decision making for assessment and support.  Case was discussed on Rounds with the Respiratory Therapy Staff  Allyne Gee, MD [facility] Pulmonary Critical Care Medicine Sleep Medicine

## 2019-10-04 DIAGNOSIS — J690 Pneumonitis due to inhalation of food and vomit: Secondary | ICD-10-CM | POA: Diagnosis not present

## 2019-10-04 DIAGNOSIS — N170 Acute kidney failure with tubular necrosis: Secondary | ICD-10-CM | POA: Diagnosis not present

## 2019-10-04 DIAGNOSIS — J9621 Acute and chronic respiratory failure with hypoxia: Secondary | ICD-10-CM | POA: Diagnosis not present

## 2019-10-04 DIAGNOSIS — Z930 Tracheostomy status: Secondary | ICD-10-CM | POA: Diagnosis not present

## 2019-10-04 LAB — BASIC METABOLIC PANEL
Anion gap: 10 (ref 5–15)
BUN: 24 mg/dL — ABNORMAL HIGH (ref 6–20)
CO2: 25 mmol/L (ref 22–32)
Calcium: 9.6 mg/dL (ref 8.9–10.3)
Chloride: 110 mmol/L (ref 98–111)
Creatinine, Ser: 0.79 mg/dL (ref 0.44–1.00)
GFR calc Af Amer: >60 mL/min (ref >60)
GFR calc non Af Amer: >60 mL/min (ref >60)
Glucose, Bld: 98 mg/dL (ref 70–99)
Potassium: 4 mmol/L (ref 3.5–5.1)
Sodium: 145 mmol/L (ref 135–145)

## 2019-10-04 LAB — CBC
HCT: 32.5 % — ABNORMAL LOW (ref 36.0–46.0)
Hemoglobin: 10 g/dL — ABNORMAL LOW (ref 12.0–15.0)
MCH: 29.7 pg (ref 26.0–34.0)
MCHC: 30.8 g/dL (ref 30.0–36.0)
MCV: 96.4 fL (ref 80.0–100.0)
Platelets: 220 10*3/uL (ref 150–400)
RBC: 3.37 MIL/uL — ABNORMAL LOW (ref 3.87–5.11)
RDW: 12.7 % (ref 11.5–15.5)
WBC: 8.2 10*3/uL (ref 4.0–10.5)
nRBC: 0 % (ref 0.0–0.2)

## 2019-10-04 NOTE — Progress Notes (Signed)
Pulmonary Critical Care Medicine [place]   PULMONARY CRITICAL CARE SERVICE  PROGRESS NOTE  Date of Service: 10/04/2019  Joanne [name]  [id number]  DOB: 1971/10/03   DOA: 09/10/2019  Referring Physician: Merton Border, MD  HPI: Joanne [name] is a 48 y.o. female seen for follow up of Acute on Chronic Respiratory Failure.  She is on T-bar currently at her baseline.  Since she doing relatively well  Medications: Reviewed on Rounds  Physical Exam:  Vitals: Temperature 97.0 pulse 79 respiratory 23 blood pressure 132/77 saturations 97%  Ventilator Settings on T collar off the ventilator on 28% FiO2  . General: Comfortable at this time . Eyes: Grossly normal lids, irises & conjunctiva . ENT: grossly tongue is normal . Neck: no obvious mass . Cardiovascular: S1 S2 normal no gallop . Respiratory: No rhonchi no rales are noted at this time . Abdomen: soft . Skin: no rash seen on limited exam . Musculoskeletal: not rigid . Psychiatric:unable to assess . Neurologic: no seizure no involuntary movements         Lab Data:   Basic Metabolic Panel: Recent Labs  Lab 09/29/19 0555 09/30/19 1020 10/04/19 0428  NA 140  --  145  K 3.9  --  4.0  CL 105  --  110  CO2 24  --  25  GLUCOSE 100*  --  98  BUN 21*  --  24*  CREATININE 0.67  --  0.79  CALCIUM 9.2  --  9.6  MG 1.7 1.9  --     ABG: No results for input(s): PHART, PCO2ART, PO2ART, HCO3, O2SAT in the last 168 hours.  Liver Function Tests: No results for input(s): AST, ALT, ALKPHOS, BILITOT, PROT, ALBUMIN in the last 168 hours. No results for input(s): LIPASE, AMYLASE in the last 168 hours. No results for input(s): AMMONIA in the last 168 hours.  CBC: Recent Labs  Lab 09/29/19 0555 10/04/19 0428  WBC 8.9 8.2  HGB 9.8* 10.0*  HCT 32.4* 32.5*  MCV 97.3 96.4  PLT PLATELET CLUMPS NOTED ON SMEAR, UNABLE TO ESTIMATE 220    Cardiac Enzymes: No results for input(s): CKTOTAL, CKMB,  CKMBINDEX, TROPONINI in the last 168 hours.  BNP (last 3 results) No results for input(s): BNP in the last 8760 hours.  ProBNP (last 3 results) No results for input(s): PROBNP in the last 8760 hours.  Radiological Exams: No results found.  Assessment/Plan Active Problems:   Acute on chronic respiratory failure with hypoxia (HCC)   Tracheostomy status (HCC)   Aspiration pneumonia due to gastric secretions (HCC)   Acute kidney injury (AKI) with acute tubular necrosis (ATN) (HCC)   Traumatic brain injury ([place])   1. Acute on chronic respiratory failure with hypoxia continue with the T collar patient is at her baseline with 28% FiO2. 2. Tracheostomy remains in place we will continue with supportive care 3. Aspiration pneumonia treated we will continue to follow 4. Acute renal failure and tubular necrosis labs of improved 5. Traumatic brain injury no changes at this time   I have personally seen and evaluated the patient, evaluated laboratory and imaging results, formulated the assessment and plan and placed orders. The Patient requires high complexity decision making for assessment and support.  Case was discussed on Rounds with the Respiratory Therapy Staff  Allyne Gee, MD [facility] Pulmonary Critical Care Medicine Sleep Medicine

## 2019-10-05 DIAGNOSIS — J9621 Acute and chronic respiratory failure with hypoxia: Secondary | ICD-10-CM | POA: Diagnosis not present

## 2019-10-05 DIAGNOSIS — N170 Acute kidney failure with tubular necrosis: Secondary | ICD-10-CM | POA: Diagnosis not present

## 2019-10-05 DIAGNOSIS — Z930 Tracheostomy status: Secondary | ICD-10-CM | POA: Diagnosis not present

## 2019-10-05 DIAGNOSIS — J690 Pneumonitis due to inhalation of food and vomit: Secondary | ICD-10-CM | POA: Diagnosis not present

## 2019-10-05 NOTE — Progress Notes (Signed)
Pulmonary Critical Care Medicine [place]   PULMONARY CRITICAL CARE SERVICE  PROGRESS NOTE  Date of Service: 10/05/2019  Joanne [name]  [id number]:[id number]  DOB: 14-Mar-1971   DOA: 09/10/2019  Referring Physician: Merton Border, MD  HPI: Joanne [name] is a 48 y.o. female seen for follow up of Acute on Chronic Respiratory Failure.  She is on T collar currently on 28% FiO2 with PMV has been doing well she is at her baseline  Medications: Reviewed on Rounds  Physical Exam:  Vitals: Temperature 97.4 pulse 60 respiratory 18 blood pressure 113/72 saturations 98%  Ventilator Settings off the ventilator on T collar currently on 28% FiO2 with the PMV in place  . General: Comfortable at this time . Eyes: Grossly normal lids, irises & conjunctiva . ENT: grossly tongue is normal . Neck: no obvious mass . Cardiovascular: S1 S2 normal no gallop . Respiratory: No rhonchi coarse breath sounds are noted . Abdomen: soft . Skin: no rash seen on limited exam . Musculoskeletal: not rigid . Psychiatric:unable to assess . Neurologic: no seizure no involuntary movements         Lab Data:   Basic Metabolic Panel: Recent Labs  Lab 09/29/19 0555 09/30/19 1020 10/04/19 0428  NA 140  --  145  K 3.9  --  4.0  CL 105  --  110  CO2 24  --  25  GLUCOSE 100*  --  98  BUN 21*  --  24*  CREATININE 0.67  --  0.79  CALCIUM 9.2  --  9.6  MG 1.7 1.9  --     ABG: No results for input(s): PHART, PCO2ART, PO2ART, HCO3, O2SAT in the last 168 hours.  Liver Function Tests: No results for input(s): AST, ALT, ALKPHOS, BILITOT, PROT, ALBUMIN in the last 168 hours. No results for input(s): LIPASE, AMYLASE in the last 168 hours. No results for input(s): AMMONIA in the last 168 hours.  CBC: Recent Labs  Lab 09/29/19 0555 10/04/19 0428  WBC 8.9 8.2  HGB 9.8* 10.0*  HCT 32.4* 32.5*  MCV 97.3 96.4  PLT PLATELET CLUMPS NOTED ON SMEAR, UNABLE TO ESTIMATE 220    Cardiac  Enzymes: No results for input(s): CKTOTAL, CKMB, CKMBINDEX, TROPONINI in the last 168 hours.  BNP (last 3 results) No results for input(s): BNP in the last 8760 hours.  ProBNP (last 3 results) No results for input(s): PROBNP in the last 8760 hours.  Radiological Exams: No results found.  Assessment/Plan Active Problems:   Acute on chronic respiratory failure with hypoxia (HCC)   Tracheostomy status (HCC)   Aspiration pneumonia due to gastric secretions (HCC)   Acute kidney injury (AKI) with acute tubular necrosis (ATN) (HCC)   Traumatic brain injury ([place])   1. Acute on chronic respiratory failure with hypoxia continue with T collar and PMV not certain that she is currently able to Decannulate as this may be her baseline however we will discussed on multidisciplinary rounds further 2. Aspiration pneumonia treated improving 3. Acute renal failure improving 4. Traumatic brain injury no change 5. Tracheostomy remains in place   I have personally seen and evaluated the patient, evaluated laboratory and imaging results, formulated the assessment and plan and placed orders. The Patient requires high complexity decision making for assessment and support.  Case was discussed on Rounds with the Respiratory Therapy Staff  Allyne Gee, MD [facility] Pulmonary Critical Care Medicine Sleep Medicine

## 2019-10-06 DIAGNOSIS — J9621 Acute and chronic respiratory failure with hypoxia: Secondary | ICD-10-CM | POA: Diagnosis not present

## 2019-10-06 DIAGNOSIS — Z930 Tracheostomy status: Secondary | ICD-10-CM | POA: Diagnosis not present

## 2019-10-06 DIAGNOSIS — J690 Pneumonitis due to inhalation of food and vomit: Secondary | ICD-10-CM | POA: Diagnosis not present

## 2019-10-06 DIAGNOSIS — N170 Acute kidney failure with tubular necrosis: Secondary | ICD-10-CM | POA: Diagnosis not present

## 2019-10-06 NOTE — Progress Notes (Signed)
Pulmonary Critical Care Medicine [place]   PULMONARY CRITICAL CARE SERVICE  PROGRESS NOTE  Date of Service: 10/06/2019  Joanne [name]  VOZ:[id number]  DOB: Feb 25, 1971   DOA: 09/10/2019  Referring Physician: Merton Border, MD  HPI: Joanne [name] is a 48 y.o. female seen for follow up of Acute on Chronic Respiratory Failure.  Patient currently on T collar has been on 2018 FiO2 currently good saturations are noted  Medications: Reviewed on Rounds  Physical Exam:  Vitals: Temperature 97.4 pulse 82 respiratory rate 22 blood pressure 143/75 saturations 99%  Ventilator Settings off the ventilator on T collar  . General: Comfortable at this time . Eyes: Grossly normal lids, irises & conjunctiva . ENT: grossly tongue is normal . Neck: no obvious mass . Cardiovascular: S1 S2 normal no gallop . Respiratory: No rhonchi coarse breath sounds are noted . Abdomen: soft . Skin: no rash seen on limited exam . Musculoskeletal: not rigid . Psychiatric:unable to assess . Neurologic: no seizure no involuntary movements         Lab Data:   Basic Metabolic Panel: Recent Labs  Lab 09/30/19 1020 10/04/19 0428  NA  --  145  K  --  4.0  CL  --  110  CO2  --  25  GLUCOSE  --  98  BUN  --  24*  CREATININE  --  0.79  CALCIUM  --  9.6  MG 1.9  --     ABG: No results for input(s): PHART, PCO2ART, PO2ART, HCO3, O2SAT in the last 168 hours.  Liver Function Tests: No results for input(s): AST, ALT, ALKPHOS, BILITOT, PROT, ALBUMIN in the last 168 hours. No results for input(s): LIPASE, AMYLASE in the last 168 hours. No results for input(s): AMMONIA in the last 168 hours.  CBC: Recent Labs  Lab 10/04/19 0428  WBC 8.2  HGB 10.0*  HCT 32.5*  MCV 96.4  PLT 220    Cardiac Enzymes: No results for input(s): CKTOTAL, CKMB, CKMBINDEX, TROPONINI in the last 168 hours.  BNP (last 3 results) No results for input(s): BNP in the last 8760 hours.  ProBNP (last 3  results) No results for input(s): PROBNP in the last 8760 hours.  Radiological Exams: No results found.  Assessment/Plan Active Problems:   Acute on chronic respiratory failure with hypoxia (HCC)   Tracheostomy status (HCC)   Aspiration pneumonia due to gastric secretions (HCC)   Acute kidney injury (AKI) with acute tubular necrosis (ATN) (HCC)   Traumatic brain injury ([place])   1. Acute on chronic kidney failure hypoxia we will continue with T collar trials continue plan towards placement 2. Aspiration pneumonia treated we will continue to follow 3. Acute renal failure labs are being monitored 4. Traumatic brain injury no change continue present management 5. Tracheostomy will remain in place   I have personally seen and evaluated the patient, evaluated laboratory and imaging results, formulated the assessment and plan and placed orders. The Patient requires high complexity decision making for assessment and support.  Case was discussed on Rounds with the Respiratory Therapy Staff  Allyne Gee, MD [facility] Pulmonary Critical Care Medicine Sleep Medicine

## 2019-10-07 DIAGNOSIS — J690 Pneumonitis due to inhalation of food and vomit: Secondary | ICD-10-CM | POA: Diagnosis not present

## 2019-10-07 DIAGNOSIS — N170 Acute kidney failure with tubular necrosis: Secondary | ICD-10-CM | POA: Diagnosis not present

## 2019-10-07 DIAGNOSIS — J9621 Acute and chronic respiratory failure with hypoxia: Secondary | ICD-10-CM | POA: Diagnosis not present

## 2019-10-07 DIAGNOSIS — Z930 Tracheostomy status: Secondary | ICD-10-CM | POA: Diagnosis not present

## 2019-10-07 NOTE — Progress Notes (Addendum)
Pulmonary Critical Care Medicine [place]   PULMONARY CRITICAL CARE SERVICE  PROGRESS NOTE  Date of Service: 10/07/2019  Joanne [name]  [id number]  DOB: Aug 28, 1971   DOA: 09/10/2019  Referring Physician: Merton Border, MD  HPI: Joanne [name] is a 48 y.o. female seen for follow up of Acute on Chronic Respiratory Failure.  Patient currently is on T collar has been on 28% FiO2 with good saturations noted at this time  Medications: Reviewed on Rounds  Physical Exam:  Vitals: Temperature 97.0 pulse 85 respiratory 18 blood pressure is 113/71 saturations 98%  Ventilator Settings off the ventilator right now on T collar  . General: Comfortable at this time . Eyes: Grossly normal lids, irises & conjunctiva . ENT: grossly tongue is normal . Neck: no obvious mass . Cardiovascular: S1 S2 normal no gallop . Respiratory: No rhonchi coarse breath sounds . Abdomen: soft . Skin: no rash seen on limited exam . Musculoskeletal: not rigid . Psychiatric:unable to assess . Neurologic: no seizure no involuntary movements         Lab Data:   Basic Metabolic Panel: Recent Labs  Lab 10/04/19 0428  NA 145  K 4.0  CL 110  CO2 25  GLUCOSE 98  BUN 24*  CREATININE 0.79  CALCIUM 9.6    ABG: No results for input(s): PHART, PCO2ART, PO2ART, HCO3, O2SAT in the last 168 hours.  Liver Function Tests: No results for input(s): AST, ALT, ALKPHOS, BILITOT, PROT, ALBUMIN in the last 168 hours. No results for input(s): LIPASE, AMYLASE in the last 168 hours. No results for input(s): AMMONIA in the last 168 hours.  CBC: Recent Labs  Lab 10/04/19 0428  WBC 8.2  HGB 10.0*  HCT 32.5*  MCV 96.4  PLT 220    Cardiac Enzymes: No results for input(s): CKTOTAL, CKMB, CKMBINDEX, TROPONINI in the last 168 hours.  BNP (last 3 results) No results for input(s): BNP in the last 8760 hours.  ProBNP (last 3 results) No results for input(s): PROBNP in the last 8760  hours.  Radiological Exams: No results found.  Assessment/Plan Active Problems:   Acute on chronic respiratory failure with hypoxia (HCC)   Tracheostomy status (HCC)   Aspiration pneumonia due to gastric secretions (HCC)   Acute kidney injury (AKI) with acute tubular necrosis (ATN) (HCC)   Traumatic brain injury ([place])   1. Acute on chronic respiratory failure hypoxia she is at her baseline on T collar we will continue with supportive care 2. Tracheostomy remains in place 3. Aspiration pneumonia treated 4. Acute renal failure we will continue to monitor 5. Traumatic brain injury no changes   I have personally seen and evaluated the patient, evaluated laboratory and imaging results, formulated the assessment and plan and placed orders. The Patient requires high complexity decision making for assessment and support.  Case was discussed on Rounds with the Respiratory Therapy Staff  Allyne Gee, MD [facility] Pulmonary Critical Care Medicine Sleep Medicine

## 2019-10-08 DIAGNOSIS — J9621 Acute and chronic respiratory failure with hypoxia: Secondary | ICD-10-CM | POA: Diagnosis not present

## 2019-10-08 DIAGNOSIS — Z930 Tracheostomy status: Secondary | ICD-10-CM | POA: Diagnosis not present

## 2019-10-08 DIAGNOSIS — J690 Pneumonitis due to inhalation of food and vomit: Secondary | ICD-10-CM | POA: Diagnosis not present

## 2019-10-08 DIAGNOSIS — N170 Acute kidney failure with tubular necrosis: Secondary | ICD-10-CM | POA: Diagnosis not present

## 2019-10-08 NOTE — Progress Notes (Signed)
Pulmonary Critical Care Medicine [place]   PULMONARY CRITICAL CARE SERVICE  PROGRESS NOTE  Date of Service: 10/08/2019  Joanne [name]  [id number]  DOB: 1971/06/21   DOA: 09/10/2019  Referring Physician: Merton Border, MD  HPI: Joanne [name] is a 48 y.o. female seen for follow up of Acute on Chronic Respiratory Failure.  Patient currently is on T collar has been on 20% FiO2 with good saturations.  Possible discharge  Medications: Reviewed on Rounds  Physical Exam:  Vitals: Temperature 97.0 pulse 77 respiratory 19 blood pressure 120/68 saturations 100%  Ventilator Settings off the ventilator currently on T collar  . General: Comfortable at this time . Eyes: Grossly normal lids, irises & conjunctiva . ENT: grossly tongue is normal . Neck: no obvious mass . Cardiovascular: S1 S2 normal no gallop . Respiratory: No rhonchi no rales are noted at this time . Abdomen: soft . Skin: no rash seen on limited exam . Musculoskeletal: not rigid . Psychiatric:unable to assess . Neurologic: no seizure no involuntary movements         Lab Data:   Basic Metabolic Panel: Recent Labs  Lab 10/04/19 0428  NA 145  K 4.0  CL 110  CO2 25  GLUCOSE 98  BUN 24*  CREATININE 0.79  CALCIUM 9.6    ABG: No results for input(s): PHART, PCO2ART, PO2ART, HCO3, O2SAT in the last 168 hours.  Liver Function Tests: No results for input(s): AST, ALT, ALKPHOS, BILITOT, PROT, ALBUMIN in the last 168 hours. No results for input(s): LIPASE, AMYLASE in the last 168 hours. No results for input(s): AMMONIA in the last 168 hours.  CBC: Recent Labs  Lab 10/04/19 0428  WBC 8.2  HGB 10.0*  HCT 32.5*  MCV 96.4  PLT 220    Cardiac Enzymes: No results for input(s): CKTOTAL, CKMB, CKMBINDEX, TROPONINI in the last 168 hours.  BNP (last 3 results) No results for input(s): BNP in the last 8760 hours.  ProBNP (last 3 results) No results for input(s): PROBNP in the last  8760 hours.  Radiological Exams: No results found.  Assessment/Plan Active Problems:   Acute on chronic respiratory failure with hypoxia (HCC)   Tracheostomy status (HCC)   Aspiration pneumonia due to gastric secretions (HCC)   Acute kidney injury (AKI) with acute tubular necrosis (ATN) (HCC)   Traumatic brain injury ([place])   1. Acute on chronic respiratory failure hypoxia patient currently continues with on T collar trials right now is on 20% FiO2 good saturations. 2. Tracheostomy will continue with supportive care 3. Aspiration pneumonia treated we will continue present management 4. Acute renal failure with ATN 5. Traumatic brain injury no changes are noted at this time   I have personally seen and evaluated the patient, evaluated laboratory and imaging results, formulated the assessment and plan and placed orders. The Patient requires high complexity decision making for assessment and support.  Case was discussed on Rounds with the Respiratory Therapy Staff  Allyne Gee, MD [facility] Pulmonary Critical Care Medicine Sleep Medicine

## 2019-10-09 DIAGNOSIS — N170 Acute kidney failure with tubular necrosis: Secondary | ICD-10-CM | POA: Diagnosis not present

## 2019-10-09 DIAGNOSIS — J690 Pneumonitis due to inhalation of food and vomit: Secondary | ICD-10-CM | POA: Diagnosis not present

## 2019-10-09 DIAGNOSIS — J9621 Acute and chronic respiratory failure with hypoxia: Secondary | ICD-10-CM | POA: Diagnosis not present

## 2019-10-09 DIAGNOSIS — Z930 Tracheostomy status: Secondary | ICD-10-CM | POA: Diagnosis not present

## 2019-10-09 NOTE — Progress Notes (Signed)
Pulmonary Critical Care Medicine [place]   PULMONARY CRITICAL CARE SERVICE  PROGRESS NOTE  Date of Service: 10/09/2019  Joanne [name]  [id number]  DOB: [birth year]   DOA: 09/10/2019  Referring Physician: Merton Border, MD  HPI: Joanne [name] is a 48 y.o. female seen for follow up of Acute on Chronic Respiratory Failure.  Patient is on T collar currently on 28% at her baseline.  Medications: Reviewed on Rounds  Physical Exam:  Vitals: Temperature 97.1 pulse 84 respiratory 21 blood pressure 143/77 saturations 100%  Ventilator Settings off the ventilator right now on T collar  . General: Comfortable at this time . Eyes: Grossly normal lids, irises & conjunctiva . ENT: grossly tongue is normal . Neck: no obvious mass . Cardiovascular: S1 S2 normal no gallop . Respiratory: No rhonchi no rales are noted at this time . Abdomen: soft . Skin: no rash seen on limited exam . Musculoskeletal: not rigid . Psychiatric:unable to assess . Neurologic: no seizure no involuntary movements         Lab Data:   Basic Metabolic Panel: Recent Labs  Lab 10/04/19 0428  NA 145  K 4.0  CL 110  CO2 25  GLUCOSE 98  BUN 24*  CREATININE 0.79  CALCIUM 9.6    ABG: No results for input(s): PHART, PCO2ART, PO2ART, HCO3, O2SAT in the last 168 hours.  Liver Function Tests: No results for input(s): AST, ALT, ALKPHOS, BILITOT, PROT, ALBUMIN in the last 168 hours. No results for input(s): LIPASE, AMYLASE in the last 168 hours. No results for input(s): AMMONIA in the last 168 hours.  CBC: Recent Labs  Lab 10/04/19 0428  WBC 8.2  HGB 10.0*  HCT 32.5*  MCV 96.4  PLT 220    Cardiac Enzymes: No results for input(s): CKTOTAL, CKMB, CKMBINDEX, TROPONINI in the last 168 hours.  BNP (last 3 results) No results for input(s): BNP in the last 8760 hours.  ProBNP (last 3 results) No results for input(s): PROBNP in the last 8760 hours.  Radiological Exams: No  results found.  Assessment/Plan Active Problems:   Acute on chronic respiratory failure with hypoxia (HCC)   Tracheostomy status (HCC)   Aspiration pneumonia due to gastric secretions (HCC)   Acute kidney injury (AKI) with acute tubular necrosis (ATN) (HCC)   Traumatic brain injury ([place])   1. Acute on chronic respiratory failure hypoxia plan is to continue T collar trials titrate oxygen continue pulmonary toilet 2. Tracheostomy remains in place 3. Aspiration pneumonia treated we will continue to monitor 4. Acute renal failure at baseline 5. Traumatic brain injury no change   I have personally seen and evaluated the patient, evaluated laboratory and imaging results, formulated the assessment and plan and placed orders. The Patient requires high complexity decision making for assessment and support.  Case was discussed on Rounds with the Respiratory Therapy Staff  Allyne Gee, MD [facility] Pulmonary Critical Care Medicine Sleep Medicine

## 2019-10-10 DIAGNOSIS — N170 Acute kidney failure with tubular necrosis: Secondary | ICD-10-CM | POA: Diagnosis not present

## 2019-10-10 DIAGNOSIS — J9621 Acute and chronic respiratory failure with hypoxia: Secondary | ICD-10-CM | POA: Diagnosis not present

## 2019-10-10 DIAGNOSIS — J690 Pneumonitis due to inhalation of food and vomit: Secondary | ICD-10-CM | POA: Diagnosis not present

## 2019-10-10 DIAGNOSIS — Z930 Tracheostomy status: Secondary | ICD-10-CM | POA: Diagnosis not present

## 2019-10-10 LAB — BASIC METABOLIC PANEL
Anion gap: 10 (ref 5–15)
BUN: 20 mg/dL (ref 6–20)
CO2: 26 mmol/L (ref 22–32)
Calcium: 9.7 mg/dL (ref 8.9–10.3)
Chloride: 108 mmol/L (ref 98–111)
Creatinine, Ser: 0.68 mg/dL (ref 0.44–1.00)
GFR calc Af Amer: >60 mL/min (ref >60)
GFR calc non Af Amer: >60 mL/min (ref >60)
Glucose, Bld: 102 mg/dL — ABNORMAL HIGH (ref 70–99)
Potassium: 3.9 mmol/L (ref 3.5–5.1)
Sodium: 144 mmol/L (ref 135–145)

## 2019-10-10 LAB — CBC
HCT: 35.4 % — ABNORMAL LOW (ref 36.0–46.0)
Hemoglobin: 10.9 g/dL — ABNORMAL LOW (ref 12.0–15.0)
MCH: 29.8 pg (ref 26.0–34.0)
MCHC: 30.8 g/dL (ref 30.0–36.0)
MCV: 96.7 fL (ref 80.0–100.0)
Platelets: 229 10*3/uL (ref 150–400)
RBC: 3.66 MIL/uL — ABNORMAL LOW (ref 3.87–5.11)
RDW: 12.8 % (ref 11.5–15.5)
WBC: 8 10*3/uL (ref 4.0–10.5)
nRBC: 0 % (ref 0.0–0.2)

## 2019-10-10 NOTE — Progress Notes (Signed)
Pulmonary Critical Care Medicine [place]   PULMONARY CRITICAL CARE SERVICE  PROGRESS NOTE  Date of Service: 10/10/2019  Joanne [name]  [id number]  DOB: June 07, 1971   DOA: 09/10/2019  Referring Physician: Merton Border, MD  HPI: Joanne [name] is a 48 y.o. female seen for follow up of Acute on Chronic Respiratory Failure.  She is weaned off the ventilator remains on T collar now is on room air ENT had seen the patient at the other facility and recommended that her tracheostomy never be removed right now she is actually done quite well in terms of being able to come off the ventilator  Medications: Reviewed on Rounds  Physical Exam:  Vitals: Temperature 97.4 pulse 64 respiratory 18 blood pressure 152/63 saturations 100%  Ventilator Settings off the vent on T collar room air  . General: Comfortable at this time . Eyes: Grossly normal lids, irises & conjunctiva . ENT: grossly tongue is normal . Neck: no obvious mass . Cardiovascular: S1 S2 normal no gallop . Respiratory: No rhonchi no rales are noted at this time . Abdomen: soft . Skin: no rash seen on limited exam . Musculoskeletal: not rigid . Psychiatric:unable to assess . Neurologic: no seizure no involuntary movements         Lab Data:   Basic Metabolic Panel: Recent Labs  Lab 10/04/19 0428 10/10/19 0504  NA 145 144  K 4.0 3.9  CL 110 108  CO2 25 26  GLUCOSE 98 102*  BUN 24* 20  CREATININE 0.79 0.68  CALCIUM 9.6 9.7    ABG: No results for input(s): PHART, PCO2ART, PO2ART, HCO3, O2SAT in the last 168 hours.  Liver Function Tests: No results for input(s): AST, ALT, ALKPHOS, BILITOT, PROT, ALBUMIN in the last 168 hours. No results for input(s): LIPASE, AMYLASE in the last 168 hours. No results for input(s): AMMONIA in the last 168 hours.  CBC: Recent Labs  Lab 10/04/19 0428 10/10/19 0504  WBC 8.2 8.0  HGB 10.0* 10.9*  HCT 32.5* 35.4*  MCV 96.4 96.7  PLT 220 229     Cardiac Enzymes: No results for input(s): CKTOTAL, CKMB, CKMBINDEX, TROPONINI in the last 168 hours.  BNP (last 3 results) No results for input(s): BNP in the last 8760 hours.  ProBNP (last 3 results) No results for input(s): PROBNP in the last 8760 hours.  Radiological Exams: No results found.  Assessment/Plan Active Problems:   Acute on chronic respiratory failure with hypoxia (HCC)   Tracheostomy status (HCC)   Aspiration pneumonia due to gastric secretions (HCC)   Acute kidney injury (AKI) with acute tubular necrosis (ATN) (HCC)   Traumatic brain injury ([place])   1. Acute on chronic respiratory failure hypoxia plan is to continue with T collar trials at this time patient is not to be decannulated 2. Tracheostomy will remain in place per recommendations of ENT 3. Aspiration pneumonia treated improved 4. Acute renal failure treated improved 5. Traumatic brain injury no change   I have personally seen and evaluated the patient, evaluated laboratory and imaging results, formulated the assessment and plan and placed orders. The Patient requires high complexity decision making for assessment and support.  Case was discussed on Rounds with the Respiratory Therapy Staff  Allyne Gee, MD [facility] Pulmonary Critical Care Medicine Sleep Medicine

## 2019-10-11 DIAGNOSIS — N170 Acute kidney failure with tubular necrosis: Secondary | ICD-10-CM | POA: Diagnosis not present

## 2019-10-11 DIAGNOSIS — J9621 Acute and chronic respiratory failure with hypoxia: Secondary | ICD-10-CM | POA: Diagnosis not present

## 2019-10-11 DIAGNOSIS — J690 Pneumonitis due to inhalation of food and vomit: Secondary | ICD-10-CM | POA: Diagnosis not present

## 2019-10-11 DIAGNOSIS — Z930 Tracheostomy status: Secondary | ICD-10-CM | POA: Diagnosis not present

## 2019-10-11 NOTE — Progress Notes (Signed)
Pulmonary Critical Care Medicine [place]   PULMONARY CRITICAL CARE SERVICE  PROGRESS NOTE  Date of Service: 10/11/2019  Joanne [name]  [id number]  DOB: [birth year]   DOA: 09/10/2019  Referring Physician: Merton Border, MD  HPI: CLEONE [name] is a 48 y.o. female seen for follow up of Acute on Chronic Respiratory Failure.  Patient is on T collar now on 28% FiO2 at baseline  Medications: Reviewed on Rounds  Physical Exam:  Vitals: Temperature 98.6 pulse 60 respiratory rate 16 blood pressure 133/80 saturations 98%  Ventilator Settings off the ventilator on T collar with an FiO2 of 28%  . General: Comfortable at this time . Eyes: Grossly normal lids, irises & conjunctiva . ENT: grossly tongue is normal . Neck: no obvious mass . Cardiovascular: S1 S2 normal no gallop . Respiratory: No rhonchi no rales are noted at this time . Abdomen: soft . Skin: no rash seen on limited exam . Musculoskeletal: not rigid . Psychiatric:unable to assess . Neurologic: no seizure no involuntary movements         Lab Data:   Basic Metabolic Panel: Recent Labs  Lab 10/10/19 0504  NA 144  K 3.9  CL 108  CO2 26  GLUCOSE 102*  BUN 20  CREATININE 0.68  CALCIUM 9.7    ABG: No results for input(s): PHART, PCO2ART, PO2ART, HCO3, O2SAT in the last 168 hours.  Liver Function Tests: No results for input(s): AST, ALT, ALKPHOS, BILITOT, PROT, ALBUMIN in the last 168 hours. No results for input(s): LIPASE, AMYLASE in the last 168 hours. No results for input(s): AMMONIA in the last 168 hours.  CBC: Recent Labs  Lab 10/10/19 0504  WBC 8.0  HGB 10.9*  HCT 35.4*  MCV 96.7  PLT 229    Cardiac Enzymes: No results for input(s): CKTOTAL, CKMB, CKMBINDEX, TROPONINI in the last 168 hours.  BNP (last 3 results) No results for input(s): BNP in the last 8760 hours.  ProBNP (last 3 results) No results for input(s): PROBNP in the last 8760 hours.  Radiological  Exams: No results found.  Assessment/Plan Active Problems:   Acute on chronic respiratory failure with hypoxia (HCC)   Tracheostomy status (HCC)   Aspiration pneumonia due to gastric secretions (HCC)   Acute kidney injury (AKI) with acute tubular necrosis (ATN) (HCC)   Traumatic brain injury ([place])   1. Acute on chronic respiratory failure with hypoxia patient is at baseline on 28% trach collar we will continue with supportive care 2. Tracheostomy remains in place patient not to be decannulated 3. Aspiration pneumonia treated clinically improved 4. Acute renal failure following up on labs closely 5. Traumatic brain injury no change   I have personally seen and evaluated the patient, evaluated laboratory and imaging results, formulated the assessment and plan and placed orders. The Patient requires high complexity decision making for assessment and support.  Case was discussed on Rounds with the Respiratory Therapy Staff  Allyne Gee, MD [facility] Pulmonary Critical Care Medicine Sleep Medicine

## 2019-10-12 DIAGNOSIS — N170 Acute kidney failure with tubular necrosis: Secondary | ICD-10-CM | POA: Diagnosis not present

## 2019-10-12 DIAGNOSIS — J690 Pneumonitis due to inhalation of food and vomit: Secondary | ICD-10-CM | POA: Diagnosis not present

## 2019-10-12 DIAGNOSIS — Z930 Tracheostomy status: Secondary | ICD-10-CM | POA: Diagnosis not present

## 2019-10-12 DIAGNOSIS — J9621 Acute and chronic respiratory failure with hypoxia: Secondary | ICD-10-CM | POA: Diagnosis not present

## 2019-10-12 NOTE — Progress Notes (Signed)
Pulmonary Critical Care Medicine [place]   PULMONARY CRITICAL CARE SERVICE  PROGRESS NOTE  Date of Service: 10/12/2019  Joanne [name]  [id number]  DOB: 06/28/1971   DOA: 09/10/2019  Referring Physician: Merton Border, MD  HPI: Joanne [name] is a 48 y.o. female seen for follow up of Acute on Chronic Respiratory Failure.  Patient currently is on T collar room air she is at her baseline comfortable  Medications: Reviewed on Rounds  Physical Exam:  Vitals: Temperature 97.0 pulse 69 respiratory 19 blood pressure 108/63 saturations 99%  Ventilator Settings on T collar room air  . General: Comfortable at this time . Eyes: Grossly normal lids, irises & conjunctiva . ENT: grossly tongue is normal . Neck: no obvious mass . Cardiovascular: S1 S2 normal no gallop . Respiratory: No rhonchi no rales are noted at this time . Abdomen: soft . Skin: no rash seen on limited exam . Musculoskeletal: not rigid . Psychiatric:unable to assess . Neurologic: no seizure no involuntary movements         Lab Data:   Basic Metabolic Panel: Recent Labs  Lab 10/10/19 0504  NA 144  K 3.9  CL 108  CO2 26  GLUCOSE 102*  BUN 20  CREATININE 0.68  CALCIUM 9.7    ABG: No results for input(s): PHART, PCO2ART, PO2ART, HCO3, O2SAT in the last 168 hours.  Liver Function Tests: No results for input(s): AST, ALT, ALKPHOS, BILITOT, PROT, ALBUMIN in the last 168 hours. No results for input(s): LIPASE, AMYLASE in the last 168 hours. No results for input(s): AMMONIA in the last 168 hours.  CBC: Recent Labs  Lab 10/10/19 0504  WBC 8.0  HGB 10.9*  HCT 35.4*  MCV 96.7  PLT 229    Cardiac Enzymes: No results for input(s): CKTOTAL, CKMB, CKMBINDEX, TROPONINI in the last 168 hours.  BNP (last 3 results) No results for input(s): BNP in the last 8760 hours.  ProBNP (last 3 results) No results for input(s): PROBNP in the last 8760 hours.  Radiological Exams: No  results found.  Assessment/Plan Active Problems:   Acute on chronic respiratory failure with hypoxia (HCC)   Tracheostomy status (HCC)   Aspiration pneumonia due to gastric secretions (HCC)   Acute kidney injury (AKI) with acute tubular necrosis (ATN) (HCC)   Traumatic brain injury ([place])   1. Acute on chronic respiratory failure hypoxia plan is to continue with T collar at baseline continue secretion management pulmonary toilet 2. Tracheostomy remains in place 3. Aspiration pneumonia no changes 4. Acute renal failure labs improved we will monitor 5. Traumatic brain injury no change   I have personally seen and evaluated the patient, evaluated laboratory and imaging results, formulated the assessment and plan and placed orders. The Patient requires high complexity decision making for assessment and support.  Case was discussed on Rounds with the Respiratory Therapy Staff  Allyne Gee, MD [facility] Pulmonary Critical Care Medicine Sleep Medicine

## 2019-10-13 DIAGNOSIS — J690 Pneumonitis due to inhalation of food and vomit: Secondary | ICD-10-CM | POA: Diagnosis not present

## 2019-10-13 DIAGNOSIS — Z930 Tracheostomy status: Secondary | ICD-10-CM | POA: Diagnosis not present

## 2019-10-13 DIAGNOSIS — N170 Acute kidney failure with tubular necrosis: Secondary | ICD-10-CM | POA: Diagnosis not present

## 2019-10-13 DIAGNOSIS — J9621 Acute and chronic respiratory failure with hypoxia: Secondary | ICD-10-CM | POA: Diagnosis not present

## 2019-10-13 NOTE — Progress Notes (Signed)
Pulmonary Critical Care Medicine [place]   PULMONARY CRITICAL CARE SERVICE  PROGRESS NOTE  Date of Service: 10/13/2019  Joanne [name]  [id number]  DOB: 07/04/1971   DOA: 09/10/2019  Referring Physician: Merton Border, MD  HPI: Joanne [name] is a 48 y.o. female seen for follow up of Acute on Chronic Respiratory Failure.  Patient currently is on T collar has been on room air at baseline  Medications: Reviewed on Rounds  Physical Exam:  Vitals: Temperature 97.0 pulse 79 respiratory rate 15 blood pressure 133/85 saturations 94%  Ventilator Settings off the ventilator on T collar  . General: Comfortable at this time . Eyes: Grossly normal lids, irises & conjunctiva . ENT: grossly tongue is normal . Neck: no obvious mass . Cardiovascular: S1 S2 normal no gallop . Respiratory: No rhonchi coarse breath sounds are noted . Abdomen: soft . Skin: no rash seen on limited exam . Musculoskeletal: not rigid . Psychiatric:unable to assess . Neurologic: no seizure no involuntary movements         Lab Data:   Basic Metabolic Panel: Recent Labs  Lab 10/10/19 0504  NA 144  K 3.9  CL 108  CO2 26  GLUCOSE 102*  BUN 20  CREATININE 0.68  CALCIUM 9.7    ABG: No results for input(s): PHART, PCO2ART, PO2ART, HCO3, O2SAT in the last 168 hours.  Liver Function Tests: No results for input(s): AST, ALT, ALKPHOS, BILITOT, PROT, ALBUMIN in the last 168 hours. No results for input(s): LIPASE, AMYLASE in the last 168 hours. No results for input(s): AMMONIA in the last 168 hours.  CBC: Recent Labs  Lab 10/10/19 0504  WBC 8.0  HGB 10.9*  HCT 35.4*  MCV 96.7  PLT 229    Cardiac Enzymes: No results for input(s): CKTOTAL, CKMB, CKMBINDEX, TROPONINI in the last 168 hours.  BNP (last 3 results) No results for input(s): BNP in the last 8760 hours.  ProBNP (last 3 results) No results for input(s): PROBNP in the last 8760 hours.  Radiological Exams: No  results found.  Assessment/Plan Active Problems:   Acute on chronic respiratory failure with hypoxia (HCC)   Tracheostomy status (HCC)   Aspiration pneumonia due to gastric secretions (HCC)   Acute kidney injury (AKI) with acute tubular necrosis (ATN) (HCC)   Traumatic brain injury ([place])   1. Acute on chronic respiratory failure hypoxia plan is to continue with T collar trials titrate oxygen continue pulmonary toilet 2. Tracheostomy remains in place 3. Aspiration pneumonia treated resolved 4. Acute renal failure follow-up on labs 5. Traumatic brain injury no change   I have personally seen and evaluated the patient, evaluated laboratory and imaging results, formulated the assessment and plan and placed orders. The Patient requires high complexity decision making for assessment and support.  Case was discussed on Rounds with the Respiratory Therapy Staff  Allyne Gee, MD [facility] Pulmonary Critical Care Medicine Sleep Medicine

## 2019-10-14 DIAGNOSIS — N170 Acute kidney failure with tubular necrosis: Secondary | ICD-10-CM | POA: Diagnosis not present

## 2019-10-14 DIAGNOSIS — Z930 Tracheostomy status: Secondary | ICD-10-CM | POA: Diagnosis not present

## 2019-10-14 DIAGNOSIS — J690 Pneumonitis due to inhalation of food and vomit: Secondary | ICD-10-CM | POA: Diagnosis not present

## 2019-10-14 DIAGNOSIS — J9621 Acute and chronic respiratory failure with hypoxia: Secondary | ICD-10-CM | POA: Diagnosis not present

## 2019-10-14 NOTE — Progress Notes (Signed)
Pulmonary Critical Care Medicine [place]   PULMONARY CRITICAL CARE SERVICE  PROGRESS NOTE  Date of Service: 10/14/2019  Joanne [name]  GEZ:[id number]  DOB: May 27, 1971   DOA: 09/10/2019  Referring Physician: Merton Border, MD  HPI: Joanne [name] is a 48 y.o. female seen for follow up of Acute on Chronic Respiratory Failure.  Patient currently is on T collar has been on room air using the PMV  Medications: Reviewed on Rounds  Physical Exam:  Vitals: Temperature 97.0 pulse 79 respiratory 16 blood pressure 127/69 saturations 98%  Ventilator Settings off the ventilator right now on T collar FiO2 is 21%  . General: Comfortable at this time . Eyes: Grossly normal lids, irises & conjunctiva . ENT: grossly tongue is normal . Neck: no obvious mass . Cardiovascular: S1 S2 normal no gallop . Respiratory: No rhonchi no rales are noted at this time . Abdomen: soft . Skin: no rash seen on limited exam . Musculoskeletal: not rigid . Psychiatric:unable to assess . Neurologic: no seizure no involuntary movements         Lab Data:   Basic Metabolic Panel: Recent Labs  Lab 10/10/19 0504  NA 144  K 3.9  CL 108  CO2 26  GLUCOSE 102*  BUN 20  CREATININE 0.68  CALCIUM 9.7    ABG: No results for input(s): PHART, PCO2ART, PO2ART, HCO3, O2SAT in the last 168 hours.  Liver Function Tests: No results for input(s): AST, ALT, ALKPHOS, BILITOT, PROT, ALBUMIN in the last 168 hours. No results for input(s): LIPASE, AMYLASE in the last 168 hours. No results for input(s): AMMONIA in the last 168 hours.  CBC: Recent Labs  Lab 10/10/19 0504  WBC 8.0  HGB 10.9*  HCT 35.4*  MCV 96.7  PLT 229    Cardiac Enzymes: No results for input(s): CKTOTAL, CKMB, CKMBINDEX, TROPONINI in the last 168 hours.  BNP (last 3 results) No results for input(s): BNP in the last 8760 hours.  ProBNP (last 3 results) No results for input(s): PROBNP in the last 8760  hours.  Radiological Exams: No results found.  Assessment/Plan Active Problems:   Acute on chronic respiratory failure with hypoxia (HCC)   Tracheostomy status (HCC)   Aspiration pneumonia due to gastric secretions (HCC)   Acute kidney injury (AKI) with acute tubular necrosis (ATN) (HCC)   Traumatic brain injury ([place])   1. Acute on chronic respiratory failure with hypoxia plan is to continue with T collar weaning as tolerated use the PMV also as ordered 2. Aspiration pneumonia treated clinically resolved 3. Tracheostomy remains in place 4. Acute renal failure we will continue with present management 5. Traumatic brain injury no change at this time   I have personally seen and evaluated the patient, evaluated laboratory and imaging results, formulated the assessment and plan and placed orders. The Patient requires high complexity decision making for assessment and support.  Case was discussed on Rounds with the Respiratory Therapy Staff  Allyne Gee, MD [facility] Pulmonary Critical Care Medicine Sleep Medicine

## 2019-10-15 DIAGNOSIS — Z930 Tracheostomy status: Secondary | ICD-10-CM | POA: Diagnosis not present

## 2019-10-15 DIAGNOSIS — J690 Pneumonitis due to inhalation of food and vomit: Secondary | ICD-10-CM | POA: Diagnosis not present

## 2019-10-15 DIAGNOSIS — N170 Acute kidney failure with tubular necrosis: Secondary | ICD-10-CM | POA: Diagnosis not present

## 2019-10-15 DIAGNOSIS — J9621 Acute and chronic respiratory failure with hypoxia: Secondary | ICD-10-CM | POA: Diagnosis not present

## 2019-10-15 LAB — SARS CORONAVIRUS 2 (TAT 6-24 HRS): SARS Coronavirus 2: NEGATIVE

## 2019-10-15 NOTE — Progress Notes (Signed)
Pulmonary Critical Care Medicine [place]   PULMONARY CRITICAL CARE SERVICE  PROGRESS NOTE  Date of Service: 10/15/2019  Joanne [name]  ZOX:[id number]  DOB: [birth year]   DOA: 09/10/2019  Referring Physician: Merton Border, MD  HPI: Joanne [name] is a 48 y.o. female seen for follow up of Acute on Chronic Respiratory Failure.  She is on T collar right now has been on the room air doing well at her baseline  Medications: Reviewed on Rounds  Physical Exam:  Vitals: Temperature 97.6 pulse 77 respiratory rate 22 blood pressure 126/76 saturations 98%  Ventilator Settings off the ventilator right now on T collar with an FiO2 of 21%  . General: Comfortable at this time . Eyes: Grossly normal lids, irises & conjunctiva . ENT: grossly tongue is normal . Neck: no obvious mass . Cardiovascular: S1 S2 normal no gallop . Respiratory: No rhonchi no rales are noted at this time . Abdomen: soft . Skin: no rash seen on limited exam . Musculoskeletal: not rigid . Psychiatric:unable to assess . Neurologic: no seizure no involuntary movements         Lab Data:   Basic Metabolic Panel: Recent Labs  Lab 10/10/19 0504  NA 144  K 3.9  CL 108  CO2 26  GLUCOSE 102*  BUN 20  CREATININE 0.68  CALCIUM 9.7    ABG: No results for input(s): PHART, PCO2ART, PO2ART, HCO3, O2SAT in the last 168 hours.  Liver Function Tests: No results for input(s): AST, ALT, ALKPHOS, BILITOT, PROT, ALBUMIN in the last 168 hours. No results for input(s): LIPASE, AMYLASE in the last 168 hours. No results for input(s): AMMONIA in the last 168 hours.  CBC: Recent Labs  Lab 10/10/19 0504  WBC 8.0  HGB 10.9*  HCT 35.4*  MCV 96.7  PLT 229    Cardiac Enzymes: No results for input(s): CKTOTAL, CKMB, CKMBINDEX, TROPONINI in the last 168 hours.  BNP (last 3 results) No results for input(s): BNP in the last 8760 hours.  ProBNP (last 3 results) No results for input(s): PROBNP in  the last 8760 hours.  Radiological Exams: No results found.  Assessment/Plan Active Problems:   Acute on chronic respiratory failure with hypoxia (HCC)   Tracheostomy status (HCC)   Aspiration pneumonia due to gastric secretions (HCC)   Acute kidney injury (AKI) with acute tubular necrosis (ATN) (HCC)   Traumatic brain injury ([place])   1. Acute on chronic respiratory failure with hypoxia patient continues on T collar she is back to baseline we will 2. Tracheostomy will remain in place not to be removed 3. Aspiration pneumonia treated clinically improving 4. Acute renal failure following labs 5. Traumatic brain injury no change   I have personally seen and evaluated the patient, evaluated laboratory and imaging results, formulated the assessment and plan and placed orders. The Patient requires high complexity decision making for assessment and support.  Case was discussed on Rounds with the Respiratory Therapy Staff  Allyne Gee, MD [facility] Pulmonary Critical Care Medicine Sleep Medicine

## 2019-11-14 ENCOUNTER — Emergency Department: Payer: Medicare PPO

## 2019-11-14 ENCOUNTER — Encounter: Payer: Self-pay | Admitting: Emergency Medicine

## 2019-11-14 ENCOUNTER — Inpatient Hospital Stay
Admission: EM | Admit: 2019-11-14 | Discharge: 2019-11-20 | DRG: 871 | Disposition: A | Discharge status: Skilled Nursing Facility | Payer: Medicare PPO | Source: Skilled Nursing Facility | Attending: Internal Medicine | Admitting: Internal Medicine

## 2019-11-14 ENCOUNTER — Other Ambulatory Visit: Payer: Self-pay

## 2019-11-14 DIAGNOSIS — A4151 Sepsis due to Escherichia coli [E. coli]: Principal | ICD-10-CM | POA: Diagnosis present

## 2019-11-14 DIAGNOSIS — D649 Anemia, unspecified: Secondary | ICD-10-CM | POA: Diagnosis present

## 2019-11-14 DIAGNOSIS — L89153 Pressure ulcer of sacral region, stage 3: Secondary | ICD-10-CM | POA: Diagnosis present

## 2019-11-14 DIAGNOSIS — L8990 Pressure ulcer of unspecified site, unspecified stage: Secondary | ICD-10-CM | POA: Insufficient documentation

## 2019-11-14 DIAGNOSIS — Z91018 Allergy to other foods: Secondary | ICD-10-CM

## 2019-11-14 DIAGNOSIS — K219 Gastro-esophageal reflux disease without esophagitis: Secondary | ICD-10-CM | POA: Diagnosis present

## 2019-11-14 DIAGNOSIS — E119 Type 2 diabetes mellitus without complications: Secondary | ICD-10-CM | POA: Diagnosis present

## 2019-11-14 DIAGNOSIS — Z930 Tracheostomy status: Secondary | ICD-10-CM

## 2019-11-14 DIAGNOSIS — E876 Hypokalemia: Secondary | ICD-10-CM | POA: Diagnosis not present

## 2019-11-14 DIAGNOSIS — S069XAA Unspecified intracranial injury with loss of consciousness status unknown, initial encounter: Secondary | ICD-10-CM | POA: Diagnosis present

## 2019-11-14 DIAGNOSIS — Z87820 Personal history of traumatic brain injury: Secondary | ICD-10-CM

## 2019-11-14 DIAGNOSIS — N179 Acute kidney failure, unspecified: Secondary | ICD-10-CM | POA: Diagnosis present

## 2019-11-14 DIAGNOSIS — E870 Hyperosmolality and hypernatremia: Secondary | ICD-10-CM | POA: Diagnosis not present

## 2019-11-14 DIAGNOSIS — D696 Thrombocytopenia, unspecified: Secondary | ICD-10-CM | POA: Diagnosis present

## 2019-11-14 DIAGNOSIS — N3000 Acute cystitis without hematuria: Secondary | ICD-10-CM | POA: Diagnosis present

## 2019-11-14 DIAGNOSIS — I10 Essential (primary) hypertension: Secondary | ICD-10-CM

## 2019-11-14 DIAGNOSIS — A419 Sepsis, unspecified organism: Secondary | ICD-10-CM | POA: Diagnosis present

## 2019-11-14 DIAGNOSIS — E860 Dehydration: Secondary | ICD-10-CM | POA: Diagnosis present

## 2019-11-14 DIAGNOSIS — S069X9A Unspecified intracranial injury with loss of consciousness of unspecified duration, initial encounter: Secondary | ICD-10-CM | POA: Diagnosis present

## 2019-11-14 DIAGNOSIS — Z7984 Long term (current) use of oral hypoglycemic drugs: Secondary | ICD-10-CM

## 2019-11-14 DIAGNOSIS — Z20822 Contact with and (suspected) exposure to covid-19: Secondary | ICD-10-CM | POA: Diagnosis present

## 2019-11-14 DIAGNOSIS — R471 Dysarthria and anarthria: Secondary | ICD-10-CM | POA: Diagnosis present

## 2019-11-14 DIAGNOSIS — I5042 Chronic combined systolic (congestive) and diastolic (congestive) heart failure: Secondary | ICD-10-CM | POA: Diagnosis present

## 2019-11-14 DIAGNOSIS — G40909 Epilepsy, unspecified, not intractable, without status epilepticus: Secondary | ICD-10-CM | POA: Diagnosis present

## 2019-11-14 DIAGNOSIS — I110 Hypertensive heart disease with heart failure: Secondary | ICD-10-CM | POA: Diagnosis present

## 2019-11-14 DIAGNOSIS — G8194 Hemiplegia, unspecified affecting left nondominant side: Secondary | ICD-10-CM | POA: Diagnosis present

## 2019-11-14 DIAGNOSIS — E039 Hypothyroidism, unspecified: Secondary | ICD-10-CM | POA: Diagnosis present

## 2019-11-14 LAB — LACTIC ACID, PLASMA: Lactic Acid, Venous: 1.8 mmol/L (ref 0.5–1.9)

## 2019-11-14 MED ORDER — SODIUM CHLORIDE 0.9 % IV BOLUS
1000.0000 mL | Freq: Once | INTRAVENOUS | Status: AC
  Administered 2019-11-15: 1000 mL via INTRAVENOUS

## 2019-11-14 MED ORDER — SODIUM CHLORIDE 0.9 % IV BOLUS: 1000.0000 mL | Freq: Once | INTRAVENOUS | Status: DC

## 2019-11-14 NOTE — ED Provider Notes (Signed)
[facility] Emergency Department Provider Note  ____________________________________________   First MD Initiated Contact with Patient 11/14/19 2303     (approximate)  I have reviewed the triage vital signs and the nursing notes.   HISTORY  Chief Complaint Dehydration    HPI Joanne [name] is a 49 y.o. 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  Per EMS patient sodium was 165.  EMS states that IV was placed at nursing home with 250 mL of fluid given however patient was directed to be transferred by the primary care provider.  Patient admits to generalized myalgia, cough and weakness.        Past Medical History:  Diagnosis Date  . Acute kidney injury (AKI) with acute tubular necrosis (ATN) (HCC)   . Acute on chronic combined systolic and diastolic CHF (congestive heart failure) ([place]) 06/06/2018  . Acute on chronic respiratory failure with hypoxia ([place]) 06/06/2018  . ARDS (adult respiratory distress syndrome) ([place]) 06/06/2018  . Aspiration pneumonia due to gastric secretions ([place])   . Diabetes mellitus ([place])   . History of traumatic brain injury   . Hypothyroidism   . Pneumonia due to Klebsiella pneumoniae ([place]) 06/06/2018  . Seizure disorder ([place])   . Severe sepsis ([place]) 06/06/2018  . Tracheostomy status ([place]) 06/06/2018  . Traumatic brain injury [facility])     Patient Active Problem List   Diagnosis Date Noted  . Hypernatremia 11/15/2019  . Thrombocytopenia ([place]) 11/15/2019  . AKI (acute kidney injury) ([place]) 11/15/2019  . HTN (hypertension) 11/15/2019  . Non-insulin dependent type 2 diabetes mellitus ([place]) 11/15/2019  . Hypothyroidism 11/15/2019  . Sepsis ([place]) 11/15/2019  . Aspiration pneumonia due to gastric secretions  ([place])   . Acute kidney injury (AKI) with acute tubular necrosis (ATN) (HCC)   . Traumatic brain injury ([place])   . Acute on chronic respiratory failure with hypoxia ([place]) 06/06/2018  . Tracheostomy status ([place]) 06/06/2018  . Pneumonia due to Klebsiella pneumoniae ([place]) 06/06/2018  . Acute on chronic combined systolic and diastolic CHF (congestive heart failure) ([place]) 06/06/2018  . Sepsis secondary to UTI ([place]) 06/06/2018  . ARDS (adult respiratory distress syndrome) ([place]) 06/06/2018  . Acute on chronic respiratory failure with hypoxia ([place]) 06/06/2018  . Tracheostomy status ([place]) 06/06/2018    Past Surgical History:  Procedure Laterality Date  . TRACHEOSTOMY      Prior to Admission medications   Not on File    Allergies Tomato  Family History  Family history unknown: Yes    Social History Social History   Tobacco Use  . Smoking status: Unknown If Ever Smoked  . Smokeless tobacco: Never Used  Substance Use Topics  . Alcohol use: Not Currently  . Drug use: Not Currently    Review of Systems Constitutional: No fever/chills Eyes: No visual changes. ENT: No sore throat. Cardiovascular: Denies chest pain. Respiratory: Denies shortness of breath.  Positive cough Gastrointestinal: No abdominal pain.  No nausea, no vomiting.  No diarrhea.  No constipation. Genitourinary: Negative for dysuria. Musculoskeletal: Positive for generalized muscle aches Integumentary: Negative for rash. Neurological: Negative for headaches, focal weakness or numbness.  ____________________________________________   PHYSICAL EXAM:  VITAL SIGNS: ED Triage Vitals  Enc Vitals Group     BP      Pulse      Resp  Temp      Temp src      SpO2      Weight      Height      Head Circumference      Peak Flow      Pain Score      Pain Loc      Pain Edu?      Excl. in [place]?     Constitutional: Alert and oriented.  Eyes: Conjunctivae are normal.  Head: Atraumatic. Mouth/Throat: Patient  is wearing a mask. Neck: No stridor.  No meningeal signs.   Cardiovascular: Normal rate, regular rhythm. Good peripheral circulation. Grossly normal heart sounds. Respiratory: Normal respiratory effort.  No retractions. Gastrointestinal: Soft and nontender. No distention.  Musculoskeletal: No lower extremity tenderness nor edema. No gross deformities of extremities. Neurologic:  Normal speech and language. No gross focal neurologic deficits are appreciated.  Skin:  tunneling gluteal cleft ulcer noted.  Tourniquet noted on the left arm by nursing staff during primary survey as pictured below     Psychiatric: Mood and affect are normal. Speech and behavior are normal.  ____________________________________________   LABS (all labs ordered are listed, but only abnormal results are displayed)  Labs Reviewed  LACTIC ACID, PLASMA - Abnormal; Notable for the following components:      Result Value   Lactic Acid, Venous 2.3 (*)    All other components within normal limits  COMPREHENSIVE METABOLIC PANEL - Abnormal; Notable for the following components:   Sodium 163 (*)    Chloride 129 (*)    Glucose, Bld 214 (*)    BUN 48 (*)    Creatinine, Ser 1.31 (*)    Total Protein 9.7 (*)    Albumin 3.2 (*)    AST 49 (*)    ALT 64 (*)    Alkaline Phosphatase 170 (*)    GFR calc non Af Amer 48 (*)    GFR calc Af Amer 55 (*)    All other components within normal limits  CBC WITH DIFFERENTIAL/PLATELET - Abnormal; Notable for the following components:   WBC 11.1 (*)    HCT 49.6 (*)    MCHC 28.8 (*)    Platelets 80 (*)    Neutro Abs 8.0 (*)    All other components within normal limits  URINALYSIS, ROUTINE W REFLEX MICROSCOPIC - Abnormal; Notable for the following components:   Color, Urine AMBER (*)    APPearance HAZY (*)    Hgb urine dipstick SMALL (*)    Protein, ur 100 (*)    Nitrite POSITIVE (*)    Bacteria, UA MANY (*)    All other components within normal limits  PROTIME-INR -  Abnormal; Notable for the following components:   Prothrombin Time 15.5 (*)    All other components within normal limits  RESPIRATORY PANEL BY RT PCR (FLU A&B, COVID)  CULTURE, BLOOD (ROUTINE X 2)  CULTURE, BLOOD (ROUTINE X 2)  URINE CULTURE  LACTIC ACID, PLASMA  APTT  PREGNANCY, URINE  CK  HEMOGLOBIN A1C  HIV ANTIBODY (ROUTINE TESTING W REFLEX)  PROTIME-INR  CORTISOL-AM, BLOOD  PROCALCITONIN  CBC  BASIC METABOLIC PANEL  POC SARS CORONAVIRUS 2 AG -  ED  POC SARS CORONAVIRUS 2 AG   ____________________________________________  EKG ED ECG REPORT I, [place] N Efraim Vanallen, the attending physician, personally viewed and interpreted this ECG.   Date: 11/15/2019  EKG Time: 11:19 PM   Rate: 88  Rhythm: Normal sinus rhythm  Axis: Normal  Intervals:  Normal  ST&T Change: None    RADIOLOGY I, [place] N Makale Pindell, personally viewed and evaluated these images (plain radiographs) as part of my medical decision making, as well as reviewing the written report by the radiologist.  ED MD interpretation: No evidence of DVT noted in the left upper extremity radiologist on venous ultrasound. Mild interstitial edema noted on chest x-ray per radiologist. Official radiology report(s): US Venous Img Upper Uni Left  Result Date: 11/15/2019 CLINICAL DATA:  Tourniquet placed on arm for unknown period of time. EXAM: LEFT UPPER EXTREMITY VENOUS DOPPLER ULTRASOUND TECHNIQUE: Gray-scale sonography with graded compression, as well as color Doppler and duplex ultrasound were performed to evaluate the upper extremity deep venous system from the level of the subclavian vein and including the jugular, axillary, basilic, radial, ulnar and upper cephalic vein. Spectral Doppler was utilized to evaluate flow at rest and with distal augmentation maneuvers. COMPARISON:  None. FINDINGS: Contralateral Subclavian Vein: Respiratory phasicity is normal and symmetric with the symptomatic side. No evidence of thrombus. Normal  compressibility. Internal Jugular Vein: No evidence of thrombus. Normal compressibility, respiratory phasicity and response to augmentation. Subclavian Vein: No evidence of thrombus. Normal compressibility, respiratory phasicity and response to augmentation. Axillary Vein: No evidence of thrombus. Normal compressibility, respiratory phasicity and response to augmentation. Cephalic Vein: The cephalic vein was not well appreciated. Basilic Vein: No evidence of thrombus. Normal compressibility, respiratory phasicity and response to augmentation. Brachial Veins: No evidence of thrombus. Normal compressibility, respiratory phasicity and response to augmentation. Radial Veins: No evidence of thrombus. Normal compressibility, respiratory phasicity and response to augmentation. Ulnar Veins: No evidence of thrombus. Normal compressibility, respiratory phasicity and response to augmentation. Venous Reflux:  None visualized. Other Findings:  None visualized. IMPRESSION: No evidence of DVT within the left upper extremity. Electronically Signed   By: Constance Holster M.D.   On: 11/15/2019 01:55   DG Chest Port 1 View  Result Date: 11/14/2019 CLINICAL DATA:  Dyspnea EXAM: PORTABLE CHEST 1 VIEW COMPARISON:  September 22, 2019 FINDINGS: The tracheostomy tube terminates above the carina. There is probable mild interstitial edema. No large pleural effusion. No focal infiltrate. No pneumothorax. Degenerative changes are noted of the glenohumeral joints bilaterally. The heart size is essentially normal. There is likely atelectasis at the lung bases. There is a possible calcified pleural based plaque versus scarring involving the right mid and right lower lung zone. This is not changed significantly since 06/06/2018. IMPRESSION: 1. Lines and tubes as above. 2. Possible mild interstitial edema. No focal infiltrate or large pleural effusion. Electronically Signed   By: Constance Holster M.D.   On: 11/14/2019 23:18     ____________________________________________   PROCEDURES    .Critical Care Performed by: Gregor Hams, MD Authorized by: Gregor Hams, MD   Critical care provider statement:    Critical care time (minutes):  30   Critical care time was exclusive of:  Separately billable procedures and treating other patients   Critical care was necessary to treat or prevent imminent or life-threatening deterioration of the following conditions:  Sepsis   Critical care was time spent personally by me on the following activities:  Development of treatment plan with patient or surrogate, discussions with consultants, evaluation of patient's response to treatment, examination of patient, obtaining history from patient or surrogate, ordering and performing treatments and interventions, ordering and review of laboratory studies, ordering and review of radiographic studies, pulse oximetry, re-evaluation of patient's condition and review of old charts  ____________________________________________   INITIAL IMPRESSION / MDM / ASSESSMENT AND PLAN / ED COURSE  As part of my medical decision making, I reviewed the following data within the electronic MEDICAL RECORD NUMBER   49 year old female presented with above-stated history and physical exam.  During initial evaluation nursing staff noted that the patient had a tourniquet in place on her left arm (unknown how long this was on the patient).  Patient's left hand cold to touch however dopplerable radial pulse.  Ultrasound revealed no evidence of DVT.  Patient met sepsis criteria and as such protocol initiated.  Patient given IV ceftriaxone as urinalysis consistent with a UTI.  Regarding the patient's hyponatremia 1 L IV normal saline administered.  Patient discussed with Dr. Damita Dunnings for hospital admission for further evaluation and management for UTI sepsis and hypernatremia.  ____________________________________________  FINAL CLINICAL  IMPRESSION(S) / ED DIAGNOSES  Final diagnoses:  Sepsis, due to unspecified organism, unspecified whether acute organ dysfunction present ([place])  Hypernatremia  Acute cystitis without hematuria     MEDICATIONS GIVEN DURING THIS VISIT:  Medications  insulin aspart (novoLOG) injection 0-15 Units (has no administration in time range)  enoxaparin (LOVENOX) injection 40 mg ( Subcutaneous Canceled Entry 11/15/19 0323)  lactated ringers bolus 1,000 mL (has no administration in time range)    And  lactated ringers bolus 1,000 mL (has no administration in time range)    And  lactated ringers bolus 500 mL (has no administration in time range)  cefTRIAXone (ROCEPHIN) 1 g in sodium chloride 0.9 % 100 mL IVPB (has no administration in time range)  acetaminophen (TYLENOL) tablet 650 mg (has no administration in time range)    Or  acetaminophen (TYLENOL) suppository 650 mg (has no administration in time range)  senna-docusate (Senokot-S) tablet 1 tablet (has no administration in time range)  ondansetron (ZOFRAN) tablet 4 mg (has no administration in time range)    Or  ondansetron (ZOFRAN) injection 4 mg (has no administration in time range)  sodium chloride 0.9 % bolus 1,000 mL (0 mLs Intravenous Stopped 11/15/19 0057)  cefTRIAXone (ROCEPHIN) 1 g in sodium chloride 0.9 % 100 mL IVPB (0 g Intravenous Stopped 11/15/19 0143)     ED Discharge Orders    None      *Please note:  Joanne [name] was evaluated in Emergency Department on 11/15/2019 for the symptoms described in the history of present illness. She was evaluated in the context of the global COVID-19 pandemic, which necessitated consideration that the patient might be at risk for infection with the SARS-CoV-2 virus that causes COVID-19. Institutional protocols and algorithms that pertain to the evaluation of patients at risk for COVID-19 are in a state of rapid change based on information released by regulatory bodies including the CDC and  federal and state organizations. These policies and algorithms were followed during the patient's care in the ED.  Some ED evaluations and interventions may be delayed as a result of limited staffing during the pandemic.*  Note:  This document was prepared using Dragon voice recognition software and may include unintentional dictation errors.   Gregor Hams, MD 11/15/19 [phone]

## 2019-11-14 NOTE — ED Triage Notes (Signed)
Pt arrived via [facility] from [facility]. Reports that pt had labs drawn this afternoon and had an elevated sodium. Pt reports generalized weakness and "feeling bad all over".

## 2019-11-14 NOTE — ED Notes (Signed)
When assessing patient's skin with other RN, other RN noticed tourniquet still in place on patient's L arm. MD notified, skin severely indented and sore around site. Pulse able to doppler by MD. Also noted 2 small sacral ulcers in the buttocks midline, one which appears to have tracked

## 2019-11-15 ENCOUNTER — Encounter: Payer: Self-pay | Admitting: Internal Medicine

## 2019-11-15 ENCOUNTER — Emergency Department: Payer: Medicare PPO

## 2019-11-15 DIAGNOSIS — E870 Hyperosmolality and hypernatremia: Secondary | ICD-10-CM | POA: Diagnosis present

## 2019-11-15 DIAGNOSIS — N179 Acute kidney failure, unspecified: Secondary | ICD-10-CM

## 2019-11-15 DIAGNOSIS — N390 Urinary tract infection, site not specified: Secondary | ICD-10-CM | POA: Diagnosis not present

## 2019-11-15 DIAGNOSIS — I110 Hypertensive heart disease with heart failure: Secondary | ICD-10-CM | POA: Diagnosis present

## 2019-11-15 DIAGNOSIS — Z930 Tracheostomy status: Secondary | ICD-10-CM | POA: Diagnosis not present

## 2019-11-15 DIAGNOSIS — G40909 Epilepsy, unspecified, not intractable, without status epilepticus: Secondary | ICD-10-CM | POA: Diagnosis present

## 2019-11-15 DIAGNOSIS — D696 Thrombocytopenia, unspecified: Secondary | ICD-10-CM | POA: Diagnosis present

## 2019-11-15 DIAGNOSIS — R471 Dysarthria and anarthria: Secondary | ICD-10-CM | POA: Diagnosis present

## 2019-11-15 DIAGNOSIS — I5042 Chronic combined systolic (congestive) and diastolic (congestive) heart failure: Secondary | ICD-10-CM | POA: Diagnosis present

## 2019-11-15 DIAGNOSIS — N3000 Acute cystitis without hematuria: Secondary | ICD-10-CM | POA: Diagnosis present

## 2019-11-15 DIAGNOSIS — Z87820 Personal history of traumatic brain injury: Secondary | ICD-10-CM | POA: Diagnosis not present

## 2019-11-15 DIAGNOSIS — L8990 Pressure ulcer of unspecified site, unspecified stage: Secondary | ICD-10-CM | POA: Insufficient documentation

## 2019-11-15 DIAGNOSIS — E119 Type 2 diabetes mellitus without complications: Secondary | ICD-10-CM

## 2019-11-15 DIAGNOSIS — G8194 Hemiplegia, unspecified affecting left nondominant side: Secondary | ICD-10-CM | POA: Diagnosis present

## 2019-11-15 DIAGNOSIS — K219 Gastro-esophageal reflux disease without esophagitis: Secondary | ICD-10-CM | POA: Diagnosis present

## 2019-11-15 DIAGNOSIS — Z20822 Contact with and (suspected) exposure to covid-19: Secondary | ICD-10-CM | POA: Diagnosis present

## 2019-11-15 DIAGNOSIS — E039 Hypothyroidism, unspecified: Secondary | ICD-10-CM | POA: Diagnosis present

## 2019-11-15 DIAGNOSIS — L89153 Pressure ulcer of sacral region, stage 3: Secondary | ICD-10-CM | POA: Diagnosis present

## 2019-11-15 DIAGNOSIS — E876 Hypokalemia: Secondary | ICD-10-CM | POA: Diagnosis not present

## 2019-11-15 DIAGNOSIS — A419 Sepsis, unspecified organism: Secondary | ICD-10-CM | POA: Diagnosis present

## 2019-11-15 DIAGNOSIS — Z91018 Allergy to other foods: Secondary | ICD-10-CM | POA: Diagnosis not present

## 2019-11-15 DIAGNOSIS — D649 Anemia, unspecified: Secondary | ICD-10-CM | POA: Diagnosis present

## 2019-11-15 DIAGNOSIS — A4151 Sepsis due to Escherichia coli [E. coli]: Secondary | ICD-10-CM | POA: Diagnosis present

## 2019-11-15 DIAGNOSIS — R6520 Severe sepsis without septic shock: Secondary | ICD-10-CM | POA: Diagnosis present

## 2019-11-15 DIAGNOSIS — Z7984 Long term (current) use of oral hypoglycemic drugs: Secondary | ICD-10-CM | POA: Diagnosis not present

## 2019-11-15 DIAGNOSIS — I10 Essential (primary) hypertension: Secondary | ICD-10-CM

## 2019-11-15 DIAGNOSIS — E860 Dehydration: Secondary | ICD-10-CM | POA: Diagnosis present

## 2019-11-15 LAB — COMPREHENSIVE METABOLIC PANEL
ALT: 64 U/L — ABNORMAL HIGH (ref 0–44)
AST: 49 U/L — ABNORMAL HIGH (ref 15–41)
Albumin: 3.2 g/dL — ABNORMAL LOW (ref 3.5–5.0)
Alkaline Phosphatase: 170 U/L — ABNORMAL HIGH (ref 38–126)
Anion gap: 10 (ref 5–15)
BUN: 48 mg/dL — ABNORMAL HIGH (ref 6–20)
CO2: 24 mmol/L (ref 22–32)
Calcium: 9.3 mg/dL (ref 8.9–10.3)
Chloride: 129 mmol/L — ABNORMAL HIGH (ref 98–111)
Creatinine, Ser: 1.31 mg/dL — ABNORMAL HIGH (ref 0.44–1.00)
GFR calc Af Amer: 55 mL/min — ABNORMAL LOW (ref >60)
GFR calc non Af Amer: 48 mL/min — ABNORMAL LOW (ref >60)
Glucose, Bld: 214 mg/dL — ABNORMAL HIGH (ref 70–99)
Potassium: 3.9 mmol/L (ref 3.5–5.1)
Sodium: 163 mmol/L (ref 135–145)
Total Bilirubin: 0.3 mg/dL (ref 0.3–1.2)
Total Protein: 9.7 g/dL — ABNORMAL HIGH (ref 6.5–8.1)

## 2019-11-15 LAB — CBC
HCT: 41.6 % (ref 36.0–46.0)
Hemoglobin: 12 g/dL (ref 12.0–15.0)
MCH: 28.3 pg (ref 26.0–34.0)
MCHC: 28.8 g/dL — ABNORMAL LOW (ref 30.0–36.0)
MCV: 98.1 fL (ref 80.0–100.0)
Platelets: 131 10*3/uL — ABNORMAL LOW (ref 150–400)
RBC: 4.24 MIL/uL (ref 3.87–5.11)
RDW: 13.7 % (ref 11.5–15.5)
WBC: 12 10*3/uL — ABNORMAL HIGH (ref 4.0–10.5)
nRBC: 0 % (ref 0.0–0.2)

## 2019-11-15 LAB — CBC WITH DIFFERENTIAL/PLATELET
Abs Immature Granulocytes: 0.04 10*3/uL (ref 0.00–0.07)
Basophils Absolute: 0 10*3/uL (ref 0.0–0.1)
Basophils Relative: 0 %
Eosinophils Absolute: 0.1 10*3/uL (ref 0.0–0.5)
Eosinophils Relative: 1 %
HCT: 49.6 % — ABNORMAL HIGH (ref 36.0–46.0)
Hemoglobin: 14.3 g/dL (ref 12.0–15.0)
Immature Granulocytes: 0 %
Lymphocytes Relative: 20 %
Lymphs Abs: 2.3 10*3/uL (ref 0.7–4.0)
MCH: 28.5 pg (ref 26.0–34.0)
MCHC: 28.8 g/dL — ABNORMAL LOW (ref 30.0–36.0)
MCV: 98.8 fL (ref 80.0–100.0)
Monocytes Absolute: 0.8 10*3/uL (ref 0.1–1.0)
Monocytes Relative: 7 %
Neutro Abs: 8 10*3/uL — ABNORMAL HIGH (ref 1.7–7.7)
Neutrophils Relative %: 72 %
Platelets: 80 10*3/uL — ABNORMAL LOW (ref 150–400)
RBC: 5.02 MIL/uL (ref 3.87–5.11)
RDW: 13.9 % (ref 11.5–15.5)
WBC: 11.1 10*3/uL — ABNORMAL HIGH (ref 4.0–10.5)
nRBC: 0 % (ref 0.0–0.2)

## 2019-11-15 LAB — URINALYSIS, ROUTINE W REFLEX MICROSCOPIC
Bilirubin Urine: NEGATIVE
Glucose, UA: NEGATIVE mg/dL
Ketones, ur: NEGATIVE mg/dL
Leukocytes,Ua: NEGATIVE
Nitrite: POSITIVE — AB
Protein, ur: 100 mg/dL — AB
Specific Gravity, Urine: 1.03 (ref 1.005–1.030)
pH: 5 (ref 5.0–8.0)

## 2019-11-15 LAB — POC SARS CORONAVIRUS 2 AG: SARS Coronavirus 2 Ag: NEGATIVE

## 2019-11-15 LAB — BASIC METABOLIC PANEL
Anion gap: 9 (ref 5–15)
BUN: 42 mg/dL — ABNORMAL HIGH (ref 6–20)
BUN: 39 mg/dL — ABNORMAL HIGH (ref 6–20)
CO2: 23 mmol/L (ref 22–32)
CO2: 25 mmol/L (ref 22–32)
Calcium: 8.9 mg/dL (ref 8.9–10.3)
Calcium: 8.7 mg/dL — ABNORMAL LOW (ref 8.9–10.3)
Chloride: >130 mmol/L (ref 98–111)
Chloride: 128 mmol/L — ABNORMAL HIGH (ref 98–111)
Creatinine, Ser: 1.07 mg/dL — ABNORMAL HIGH (ref 0.44–1.00)
Creatinine, Ser: 1 mg/dL (ref 0.44–1.00)
GFR calc Af Amer: >60 mL/min (ref >60)
GFR calc Af Amer: >60 mL/min (ref >60)
GFR calc non Af Amer: >60 mL/min (ref >60)
GFR calc non Af Amer: >60 mL/min (ref >60)
Glucose, Bld: 116 mg/dL — ABNORMAL HIGH (ref 70–99)
Glucose, Bld: 133 mg/dL — ABNORMAL HIGH (ref 70–99)
Potassium: 3.7 mmol/L (ref 3.5–5.1)
Potassium: 3.2 mmol/L — ABNORMAL LOW (ref 3.5–5.1)
Sodium: 169 mmol/L (ref 135–145)
Sodium: 162 mmol/L (ref 135–145)

## 2019-11-15 LAB — HEMOGLOBIN A1C
Hgb A1c MFr Bld: 5.9 % — ABNORMAL HIGH (ref 4.8–5.6)
Mean Plasma Glucose: 122.63 mg/dL

## 2019-11-15 LAB — HIV ANTIBODY (ROUTINE TESTING W REFLEX): HIV Screen 4th Generation wRfx: NONREACTIVE

## 2019-11-15 LAB — PREGNANCY, URINE: Preg Test, Ur: NEGATIVE

## 2019-11-15 LAB — RESPIRATORY PANEL BY RT PCR (FLU A&B, COVID)
Influenza A by PCR: NEGATIVE
Influenza B by PCR: NEGATIVE
SARS Coronavirus 2 by RT PCR: NEGATIVE

## 2019-11-15 LAB — PROTIME-INR
INR: 1.2 (ref 0.8–1.2)
INR: 1.4 — ABNORMAL HIGH (ref 0.8–1.2)
Prothrombin Time: 15.5 seconds — ABNORMAL HIGH (ref 11.4–15.2)
Prothrombin Time: 16.7 seconds — ABNORMAL HIGH (ref 11.4–15.2)

## 2019-11-15 LAB — GLUCOSE, CAPILLARY
Glucose-Capillary: 96 mg/dL (ref 70–99)
Glucose-Capillary: 99 mg/dL (ref 70–99)
Glucose-Capillary: 116 mg/dL — ABNORMAL HIGH (ref 70–99)
Glucose-Capillary: 92 mg/dL (ref 70–99)

## 2019-11-15 LAB — LACTIC ACID, PLASMA: Lactic Acid, Venous: 2.3 mmol/L (ref 0.5–1.9)

## 2019-11-15 LAB — APTT: aPTT: 28 seconds (ref 24–36)

## 2019-11-15 LAB — PROCALCITONIN: Procalcitonin: <0.1 ng/mL

## 2019-11-15 LAB — MRSA PCR SCREENING: MRSA by PCR: POSITIVE — AB

## 2019-11-15 LAB — CK: Total CK: 44 U/L (ref 38–234)

## 2019-11-15 MED ORDER — SODIUM CHLORIDE 0.9 % IV SOLN
1.0000 g | Freq: Once | INTRAVENOUS | Status: AC
  Administered 2019-11-15: 1 g via INTRAVENOUS
  Filled 2019-11-15: qty 10

## 2019-11-15 MED ORDER — ONDANSETRON HCL 4 MG PO TABS: 4.0000 mg | ORAL_TABLET | Freq: Four times a day (QID) | ORAL | Status: DC | PRN

## 2019-11-15 MED ORDER — ACETAMINOPHEN 325 MG PO TABS
650.0000 mg | ORAL_TABLET | Freq: Four times a day (QID) | ORAL | Status: DC | PRN
  Administered 2019-11-17 – 2019-11-19 (×3): 650 mg via ORAL
  Filled 2019-11-15 (×3): qty 2

## 2019-11-15 MED ORDER — SODIUM CHLORIDE 0.9 % IV SOLN
1.0000 g | INTRAVENOUS | Status: DC
  Administered 2019-11-16 – 2019-11-18 (×3): 1 g via INTRAVENOUS
  Filled 2019-11-15 (×3): qty 1
  Filled 2019-11-15 (×2): qty 10

## 2019-11-15 MED ORDER — LACTATED RINGERS IV BOLUS (SEPSIS)
1000.0000 mL | Freq: Once | INTRAVENOUS | Status: AC
  Administered 2019-11-15: 1000 mL via INTRAVENOUS

## 2019-11-15 MED ORDER — SENNOSIDES-DOCUSATE SODIUM 8.6-50 MG PO TABS
1.0000 | ORAL_TABLET | Freq: Every evening | ORAL | Status: DC | PRN
  Administered 2019-11-19: 1 via ORAL

## 2019-11-15 MED ORDER — LACTATED RINGERS IV BOLUS (SEPSIS): 500.0000 mL | Freq: Once | INTRAVENOUS | Status: DC

## 2019-11-15 MED ORDER — ENOXAPARIN SODIUM 40 MG/0.4ML ~~LOC~~ SOLN
40.0000 mg | SUBCUTANEOUS | Status: DC
  Administered 2019-11-15: 23:00:00 40 mg via SUBCUTANEOUS
  Filled 2019-11-15: qty 0.4

## 2019-11-15 MED ORDER — INSULIN ASPART 100 UNIT/ML ~~LOC~~ SOLN
0.0000 [IU] | SUBCUTANEOUS | Status: DC
  Administered 2019-11-16: 3 [IU] via SUBCUTANEOUS
  Administered 2019-11-17 – 2019-11-20 (×5): 2 [IU] via SUBCUTANEOUS
  Filled 2019-11-15 (×6): qty 1

## 2019-11-15 MED ORDER — ACETAMINOPHEN 650 MG RE SUPP: 650.0000 mg | Freq: Four times a day (QID) | RECTAL | Status: DC | PRN

## 2019-11-15 MED ORDER — DEXTROSE 5 % IV SOLN: INTRAVENOUS | Status: DC

## 2019-11-15 MED ORDER — LACTATED RINGERS IV BOLUS (SEPSIS): 1000.0000 mL | Freq: Once | INTRAVENOUS | Status: DC

## 2019-11-15 MED ORDER — ONDANSETRON HCL 4 MG/2ML IJ SOLN: 4.0000 mg | Freq: Four times a day (QID) | INTRAMUSCULAR | Status: DC | PRN

## 2019-11-15 NOTE — Progress Notes (Signed)
CODE SEPSIS - PHARMACY COMMUNICATION  **Broad Spectrum Antibiotics should be administered within 1 hour of Sepsis diagnosis**  Time Code Sepsis Called/Page Received: 2306  Antibiotics Ordered: Rocephin  Time of 1st antibiotic administration: 0101  Additional action taken by pharmacy: contacted RN, issues w/ IV access  If necessary, Name of Provider/Nurse Contacted: Rolan Bucco ,PharmD Clinical Pharmacist  11/15/2019  1:36 AM
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
## 2019-11-15 NOTE — Progress Notes (Signed)
Bedside RN removed 2 subdermal needles from pts left lower abdomen that she had arrived with from the facility. Reported to Dr. Georgeann Oppenheim.

## 2019-11-15 NOTE — Progress Notes (Signed)
Notified bedside nurse of need to order fluid bolus and 3rd lactic acid. Also Code sepsis was originally called at 23:05 and then again at 02:50.

## 2019-11-15 NOTE — Progress Notes (Signed)
Notified by Bedside RN that fluids on hold per physician.

## 2019-11-15 NOTE — ED Notes (Signed)
Ultrasound and IV team at bedside

## 2019-11-15 NOTE — Plan of Care (Signed)
  Problem: Health Behavior/Discharge Planning: Goal: Ability to manage health-related needs will improve Outcome: Progressing   Problem: Clinical Measurements: Goal: Ability to maintain clinical measurements within normal limits will improve Outcome: Progressing Goal: Will remain free from infection Outcome: Progressing Goal: Cardiovascular complication will be avoided Outcome: Progressing   Problem: Activity: Goal: Risk for activity intolerance will decrease Outcome: Progressing   Problem: Nutrition: Goal: Adequate nutrition will be maintained Outcome: Progressing   Problem: Coping: Goal: Level of anxiety will decrease Outcome: Progressing   Problem: Elimination: Goal: Will not experience complications related to bowel motility Outcome: Progressing Goal: Will not experience complications related to urinary retention Outcome: Progressing   Problem: Pain Managment: Goal: General experience of comfort will improve Outcome: Progressing   Problem: Safety: Goal: Ability to remain free from injury will improve Outcome: Progressing   Problem: Skin Integrity: Goal: Risk for impaired skin integrity will decrease Outcome: Progressing   

## 2019-11-16 ENCOUNTER — Inpatient Hospital Stay: Payer: Self-pay

## 2019-11-16 DIAGNOSIS — A419 Sepsis, unspecified organism: Secondary | ICD-10-CM

## 2019-11-16 DIAGNOSIS — N390 Urinary tract infection, site not specified: Secondary | ICD-10-CM

## 2019-11-16 LAB — GLUCOSE, CAPILLARY
Glucose-Capillary: 124 mg/dL — ABNORMAL HIGH (ref 70–99)
Glucose-Capillary: 128 mg/dL — ABNORMAL HIGH (ref 70–99)
Glucose-Capillary: 167 mg/dL — ABNORMAL HIGH (ref 70–99)
Glucose-Capillary: 188 mg/dL — ABNORMAL HIGH (ref 70–99)
Glucose-Capillary: 66 mg/dL — ABNORMAL LOW (ref 70–99)
Glucose-Capillary: 77 mg/dL (ref 70–99)
Glucose-Capillary: 78 mg/dL (ref 70–99)
Glucose-Capillary: 83 mg/dL (ref 70–99)

## 2019-11-16 LAB — BASIC METABOLIC PANEL
BUN: 38 mg/dL — ABNORMAL HIGH (ref 6–20)
CO2: 18 mmol/L — ABNORMAL LOW (ref 22–32)
Calcium: 8.3 mg/dL — ABNORMAL LOW (ref 8.9–10.3)
Chloride: >130 mmol/L (ref 98–111)
Creatinine, Ser: 1.18 mg/dL — ABNORMAL HIGH (ref 0.44–1.00)
GFR calc Af Amer: >60 mL/min (ref >60)
GFR calc non Af Amer: 54 mL/min — ABNORMAL LOW (ref >60)
Glucose, Bld: 79 mg/dL (ref 70–99)
Potassium: UNDETERMINED mmol/L (ref 3.5–5.1)
Sodium: 164 mmol/L (ref 135–145)

## 2019-11-16 LAB — BASIC METABOLIC PANEL WITH GFR
Anion gap: 7 (ref 5–15)
Anion gap: 7 (ref 5–15)
BUN: 35 mg/dL — ABNORMAL HIGH (ref 6–20)
BUN: 34 mg/dL — ABNORMAL HIGH (ref 6–20)
BUN: 33 mg/dL — ABNORMAL HIGH (ref 6–20)
BUN: 31 mg/dL — ABNORMAL HIGH (ref 6–20)
CO2: 22 mmol/L (ref 22–32)
CO2: 23 mmol/L (ref 22–32)
CO2: 25 mmol/L (ref 22–32)
CO2: 24 mmol/L (ref 22–32)
Calcium: 8.7 mg/dL — ABNORMAL LOW (ref 8.9–10.3)
Calcium: 8.8 mg/dL — ABNORMAL LOW (ref 8.9–10.3)
Calcium: 8.6 mg/dL — ABNORMAL LOW (ref 8.9–10.3)
Calcium: 8.6 mg/dL — ABNORMAL LOW (ref 8.9–10.3)
Chloride: >130 mmol/L (ref 98–111)
Chloride: >130 mmol/L (ref 98–111)
Chloride: 130 mmol/L — ABNORMAL HIGH (ref 98–111)
Chloride: 129 mmol/L — ABNORMAL HIGH (ref 98–111)
Creatinine, Ser: 1.05 mg/dL — ABNORMAL HIGH (ref 0.44–1.00)
Creatinine, Ser: 1 mg/dL (ref 0.44–1.00)
Creatinine, Ser: 1.08 mg/dL — ABNORMAL HIGH (ref 0.44–1.00)
Creatinine, Ser: 1.06 mg/dL — ABNORMAL HIGH (ref 0.44–1.00)
GFR calc Af Amer: >60 mL/min
GFR calc Af Amer: >60 mL/min
GFR calc Af Amer: >60 mL/min
GFR calc Af Amer: >60 mL/min
GFR calc non Af Amer: >60 mL/min
GFR calc non Af Amer: >60 mL/min
GFR calc non Af Amer: >60 mL/min
GFR calc non Af Amer: >60 mL/min
Glucose, Bld: 186 mg/dL — ABNORMAL HIGH (ref 70–99)
Glucose, Bld: 49 mg/dL — ABNORMAL LOW (ref 70–99)
Glucose, Bld: 204 mg/dL — ABNORMAL HIGH (ref 70–99)
Glucose, Bld: 138 mg/dL — ABNORMAL HIGH (ref 70–99)
Potassium: 3.3 mmol/L — ABNORMAL LOW (ref 3.5–5.1)
Potassium: 3.3 mmol/L — ABNORMAL LOW (ref 3.5–5.1)
Potassium: 2.9 mmol/L — ABNORMAL LOW (ref 3.5–5.1)
Potassium: 3.3 mmol/L — ABNORMAL LOW (ref 3.5–5.1)
Sodium: 162 mmol/L (ref 135–145)
Sodium: 165 mmol/L (ref 135–145)
Sodium: 162 mmol/L (ref 135–145)
Sodium: 160 mmol/L — ABNORMAL HIGH (ref 135–145)

## 2019-11-16 LAB — CBC WITH DIFFERENTIAL/PLATELET
Abs Immature Granulocytes: 0.08 10*3/uL — ABNORMAL HIGH (ref 0.00–0.07)
Basophils Absolute: 0 10*3/uL (ref 0.0–0.1)
Basophils Relative: 0 %
Eosinophils Absolute: 0 10*3/uL (ref 0.0–0.5)
Eosinophils Relative: 0 %
HCT: 37.1 % (ref 36.0–46.0)
Hemoglobin: 11.1 g/dL — ABNORMAL LOW (ref 12.0–15.0)
Immature Granulocytes: 1 %
Lymphocytes Relative: 19 %
Lymphs Abs: 2.3 10*3/uL (ref 0.7–4.0)
MCH: 27.8 pg (ref 26.0–34.0)
MCHC: 29.9 g/dL — ABNORMAL LOW (ref 30.0–36.0)
MCV: 93 fL (ref 80.0–100.0)
Monocytes Absolute: 0.9 10*3/uL (ref 0.1–1.0)
Monocytes Relative: 8 %
Neutro Abs: 8.5 10*3/uL — ABNORMAL HIGH (ref 1.7–7.7)
Neutrophils Relative %: 72 %
Platelets: 109 10*3/uL — ABNORMAL LOW (ref 150–400)
RBC: 3.99 MIL/uL (ref 3.87–5.11)
RDW: 13.6 % (ref 11.5–15.5)
WBC: 11.8 10*3/uL — ABNORMAL HIGH (ref 4.0–10.5)
nRBC: 0 % (ref 0.0–0.2)

## 2019-11-16 LAB — CORTISOL-AM, BLOOD: Cortisol - AM: 25.2 ug/dL — ABNORMAL HIGH (ref 6.7–22.6)

## 2019-11-16 MED ORDER — DEXTROSE 50 % IV SOLN
1.0000 | Freq: Once | INTRAVENOUS | Status: AC
  Administered 2019-11-16: 25 mL via INTRAVENOUS

## 2019-11-16 MED ORDER — SODIUM CHLORIDE 0.9% FLUSH: 10.0000 mL | INTRAVENOUS | Status: DC | PRN

## 2019-11-16 MED ORDER — DEXTROSE 50 % IV SOLN
INTRAVENOUS | Status: AC
  Filled 2019-11-16: qty 50

## 2019-11-16 MED ORDER — SODIUM CHLORIDE 0.9% FLUSH
10.0000 mL | Freq: Two times a day (BID) | INTRAVENOUS | Status: DC
  Administered 2019-11-17 – 2019-11-20 (×5): 10 mL

## 2019-11-16 MED ORDER — GLUCAGON HCL RDNA (DIAGNOSTIC) 1 MG IJ SOLR
1.0000 mg | INTRAMUSCULAR | Status: AC
  Administered 2019-11-16: 1 mg via INTRAMUSCULAR
  Filled 2019-11-16: qty 1

## 2019-11-16 NOTE — Progress Notes (Signed)
VAST consulted for PIV restart.Poor venous access,veins small and deep.Also PIVs not lasting.Spoke with primary RN Reeves Dam, recommend PICC for IVF's/meds and lab draws.

## 2019-11-16 NOTE — Progress Notes (Signed)
Spoke with Dr Marylu Lund re PICC line vs midline placement.  Medical necessity for PICC placement obtained if unable to place midline.

## 2019-11-16 NOTE — Progress Notes (Signed)
Unable to start IV in patient.  IV team attempted midline but unsuccessful.  NP aware
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
## 2019-11-16 NOTE — Progress Notes (Deleted)
Patient's wife wanted the team to know that at baseline her husband is very unsteady on his feet. She is concerned about individuals who are working with him on mobility are aware of this.

## 2019-11-17 LAB — CBC WITH DIFFERENTIAL/PLATELET
Abs Immature Granulocytes: 0.07 10*3/uL (ref 0.00–0.07)
Abs Immature Granulocytes: 0.04 10*3/uL (ref 0.00–0.07)
Basophils Absolute: 0 10*3/uL (ref 0.0–0.1)
Basophils Absolute: 0 10*3/uL (ref 0.0–0.1)
Basophils Relative: 0 %
Basophils Relative: 0 %
Eosinophils Absolute: 0.1 10*3/uL (ref 0.0–0.5)
Eosinophils Absolute: 0.1 10*3/uL (ref 0.0–0.5)
Eosinophils Relative: 1 %
Eosinophils Relative: 1 %
HCT: 34.4 % — ABNORMAL LOW (ref 36.0–46.0)
HCT: 35.6 % — ABNORMAL LOW (ref 36.0–46.0)
Hemoglobin: 10.8 g/dL — ABNORMAL LOW (ref 12.0–15.0)
Hemoglobin: 10.6 g/dL — ABNORMAL LOW (ref 12.0–15.0)
Immature Granulocytes: 1 %
Immature Granulocytes: 1 %
Lymphocytes Relative: 35 %
Lymphocytes Relative: 31 %
Lymphs Abs: 3.3 10*3/uL (ref 0.7–4.0)
Lymphs Abs: 2.6 10*3/uL (ref 0.7–4.0)
MCH: 28.1 pg (ref 26.0–34.0)
MCH: 28 pg (ref 26.0–34.0)
MCHC: 31.4 g/dL (ref 30.0–36.0)
MCHC: 29.8 g/dL — ABNORMAL LOW (ref 30.0–36.0)
MCV: 89.6 fL (ref 80.0–100.0)
MCV: 94.2 fL (ref 80.0–100.0)
Monocytes Absolute: 1 10*3/uL (ref 0.1–1.0)
Monocytes Absolute: 0.9 10*3/uL (ref 0.1–1.0)
Monocytes Relative: 11 %
Monocytes Relative: 11 %
Neutro Abs: 4.9 10*3/uL (ref 1.7–7.7)
Neutro Abs: 4.7 10*3/uL (ref 1.7–7.7)
Neutrophils Relative %: 52 %
Neutrophils Relative %: 56 %
Platelets: 89 10*3/uL — ABNORMAL LOW (ref 150–400)
Platelets: 101 10*3/uL — ABNORMAL LOW (ref 150–400)
RBC: 3.84 MIL/uL — ABNORMAL LOW (ref 3.87–5.11)
RBC: 3.78 MIL/uL — ABNORMAL LOW (ref 3.87–5.11)
RDW: 13.8 % (ref 11.5–15.5)
RDW: 13.8 % (ref 11.5–15.5)
WBC: 9.4 10*3/uL (ref 4.0–10.5)
WBC: 8.3 10*3/uL (ref 4.0–10.5)
nRBC: 0 % (ref 0.0–0.2)
nRBC: 0 % (ref 0.0–0.2)

## 2019-11-17 LAB — GLUCOSE, CAPILLARY
Glucose-Capillary: 110 mg/dL — ABNORMAL HIGH (ref 70–99)
Glucose-Capillary: 134 mg/dL — ABNORMAL HIGH (ref 70–99)
Glucose-Capillary: 120 mg/dL — ABNORMAL HIGH (ref 70–99)
Glucose-Capillary: 124 mg/dL — ABNORMAL HIGH (ref 70–99)

## 2019-11-17 LAB — BASIC METABOLIC PANEL
Anion gap: 9 (ref 5–15)
Anion gap: 11 (ref 5–15)
BUN: 28 mg/dL — ABNORMAL HIGH (ref 6–20)
BUN: 26 mg/dL — ABNORMAL HIGH (ref 6–20)
CO2: 23 mmol/L (ref 22–32)
CO2: 19 mmol/L — ABNORMAL LOW (ref 22–32)
Calcium: 8.3 mg/dL — ABNORMAL LOW (ref 8.9–10.3)
Calcium: 8.2 mg/dL — ABNORMAL LOW (ref 8.9–10.3)
Chloride: 125 mmol/L — ABNORMAL HIGH (ref 98–111)
Chloride: 123 mmol/L — ABNORMAL HIGH (ref 98–111)
Creatinine, Ser: 1.02 mg/dL — ABNORMAL HIGH (ref 0.44–1.00)
Creatinine, Ser: 1 mg/dL (ref 0.44–1.00)
GFR calc Af Amer: >60 mL/min (ref >60)
GFR calc Af Amer: >60 mL/min (ref >60)
GFR calc non Af Amer: >60 mL/min (ref >60)
GFR calc non Af Amer: >60 mL/min (ref >60)
Glucose, Bld: 111 mg/dL — ABNORMAL HIGH (ref 70–99)
Glucose, Bld: 144 mg/dL — ABNORMAL HIGH (ref 70–99)
Potassium: 3 mmol/L — ABNORMAL LOW (ref 3.5–5.1)
Potassium: 4 mmol/L (ref 3.5–5.1)
Sodium: 157 mmol/L — ABNORMAL HIGH (ref 135–145)
Sodium: 153 mmol/L — ABNORMAL HIGH (ref 135–145)

## 2019-11-17 LAB — URINE CULTURE: Culture: >=100000 — AB

## 2019-11-17 MED ORDER — CHLORHEXIDINE GLUCONATE CLOTH 2 % EX PADS
6.0000 | MEDICATED_PAD | Freq: Every day | CUTANEOUS | Status: DC
  Administered 2019-11-18 – 2019-11-20 (×3): 6 via TOPICAL

## 2019-11-17 MED ORDER — GERHARDT'S BUTT CREAM
TOPICAL_CREAM | Freq: Two times a day (BID) | CUTANEOUS | Status: DC
  Administered 2019-11-18: 1 via TOPICAL
  Filled 2019-11-17: qty 1

## 2019-11-17 MED ORDER — MUPIROCIN 2 % EX OINT
1.0000 "application " | TOPICAL_OINTMENT | Freq: Two times a day (BID) | CUTANEOUS | Status: DC
  Administered 2019-11-17 – 2019-11-20 (×8): 1 via NASAL
  Filled 2019-11-17: qty 22

## 2019-11-17 NOTE — Progress Notes (Signed)
Oral care provided to patient. Tolerated well, patient in good spirits. Smiles during interaction with nurse and denies any discomfort or needs. Injury to left upper arm from recent tourniquet left in place at SNF improved from condition noted in pictures in chart. Gerhardts butt placed applied prn, left at bedside for soilage. Will continue to monitor.

## 2019-11-17 NOTE — Consult Note (Signed)
WOC Nurse Consult Note: Reason for Consult:Full thickness stage 3 pressure injury to sacrum, moisture and pressure Full thickness injury to left upper arm from apparent tourniquet left in place an extended period at SNF.  Present on admission.  Patient with noted IV access difficulties prior to and since admission.  Wound type: pressure and moisture to sacrum and buttocks Pressure Injury POA: Yes Measurement: bilateral buttocks and sacrum  5 cm x 8 cm x 0.3 cm erythema with 3 cm x 3 cm x 0.3 nonintact center.  Wound OMV:[facility] and moist Drainage (amount, consistency, odor) minimal serosanguinous  No odor.  Periwound: intact Dressing procedure/placement/frequency: Monitor arm tourniquet injury site each shift.  Reconsult if wound develops.  Cleanse sacrum and buttocks with soap and water.  Apply Gerhardts butt paste twice daily and PRN soilage.  Avoid disposable briefs or underpads.   Will not follow at this time.  Please re-consult if needed.  [place]le [place] MSN, RN, FNP-BC CWON Wound, Ostomy, Continence Nurse Pager [phone]
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
## 2019-11-17 NOTE — TOC Initial Note (Signed)
Transition of Care [facility]) - Initial/Assessment Note    Patient Details  Name: Joanne [name] MRN: [id number] Date of Birth: 12/11/1970  Transition of Care [facility]) CM/SW Contact:    Shelbie Hutching, RN Phone Number: 11/17/2019, 4:11 PM  Clinical Narrative:                 Patient admitted with sepsis from UTI, patient is also COVID +.  Patient is from [place] [place] and will be there for long term care per Admissions Coordinator Claiborne Billings.  Patient has been a [facility] since this past Christmas Eve, her mother and legal guardian is supposed to be working on [employer].  Patient currently only has [employer].  Before [place] patient was at [facility] in [place].  RNCM attempted to call patient's mother and left a message for a return call. Patient is trach dependent and has a history of TBI.   RNCM will cont to follow and assist with discharge planning.    Expected Discharge Plan: Skilled Nursing Facility Barriers to Discharge: Continued Medical Work up   Patient Goals and CMS Choice        Expected Discharge Plan and Services Expected Discharge Plan: [place]   Discharge Planning Services: CM Consult   Living arrangements for the past 2 months: [place]                                      Prior Living Arrangements/Services Living arrangements for the past 2 months: [place] Lives with:: Facility Resident          Need for Family Participation in Patient Care: Yes (Comment)(TBI, COVID- LTC) Care giver support system in place?: Yes (comment)(mother)   Criminal Activity/Legal Involvement Pertinent to Current Situation/Hospitalization: No - Comment as needed  Activities of Daily Living   ADL Screening (condition at time of admission) Is the patient deaf or have difficulty hearing?: No Does the patient have difficulty seeing, even when wearing glasses/contacts?: No Does the patient have  difficulty dressing or bathing?: Yes Independently performs ADLs?: No  Permission Sought/Granted Permission sought to share information with : Case Manager, Customer service manager, Family Supports Permission granted to share information with : Yes, Verbal Permission Granted     Permission granted to share info w AGENCY: Investment banker, operational granted to share info w Relationship: Mother- Health and safety inspector - Legal guardian     Emotional Assessment         Alcohol / Substance Use: Not Applicable Psych Involvement: No (comment)  Admission diagnosis:  Hypernatremia [E87.0] Acute cystitis without hematuria [N30.00] Sepsis ([place]) [A41.9] Sepsis, due to unspecified organism, unspecified whether acute organ dysfunction present [facility]) [A41.9] Patient Active Problem List   Diagnosis Date Noted  . Hypernatremia 11/15/2019  . Thrombocytopenia ([place]) 11/15/2019  . AKI (acute kidney injury) ([place]) 11/15/2019  . HTN (hypertension) 11/15/2019  . Non-insulin dependent type 2 diabetes mellitus ([place]) 11/15/2019  . Hypothyroidism 11/15/2019  . Sepsis ([place]) 11/15/2019  . Left hemiplegia ([place]) 11/15/2019  . Pressure injury of skin 11/15/2019  . Aspiration pneumonia due to gastric secretions ([place])   . Acute kidney injury (AKI) with acute tubular necrosis (ATN) (HCC)   . Traumatic brain injury ([place])   . Acute on chronic respiratory failure with hypoxia ([place]) 06/06/2018  . Tracheostomy status ([place]) 06/06/2018  . Pneumonia due to Klebsiella pneumoniae ([place]) 06/06/2018  . Acute  on chronic combined systolic and diastolic CHF (congestive heart failure) (HCC) 06/06/2018  . Sepsis secondary to UTI (HCC) 06/06/2018  . ARDS (adult respiratory distress syndrome) (HCC) 06/06/2018  . Acute on chronic respiratory failure with hypoxia (HCC) 06/06/2018  . 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   PCP:  Patient, No Pcp Per Pharmacy:  No Pharmacies Listed    Social Determinants of Health (SDOH)  Interventions    Readmission Risk Interventions No flowsheet data found.

## 2019-11-17 NOTE — Plan of Care (Signed)
  Problem: Health Behavior/Discharge Planning: Goal: Ability to manage health-related needs will improve Outcome: Progressing   Problem: Clinical Measurements: Goal: Ability to maintain clinical measurements within normal limits will improve Outcome: Progressing Goal: Will remain free from infection Outcome: Progressing Goal: Cardiovascular complication will be avoided Outcome: Progressing   Problem: Activity: Goal: Risk for activity intolerance will decrease Outcome: Progressing   Problem: Nutrition: Goal: Adequate nutrition will be maintained Outcome: Progressing   Problem: Coping: Goal: Level of anxiety will decrease Outcome: Progressing   Problem: Elimination: Goal: Will not experience complications related to bowel motility Outcome: Progressing Goal: Will not experience complications related to urinary retention Outcome: Progressing   Problem: Pain Managment: Goal: General experience of comfort will improve Outcome: Progressing   Problem: Safety: Goal: Ability to remain free from injury will improve Outcome: Progressing   Problem: Skin Integrity: Goal: Risk for impaired skin integrity will decrease Outcome: Progressing   

## 2019-11-18 LAB — CBC WITH DIFFERENTIAL/PLATELET
Abs Immature Granulocytes: 0.12 10*3/uL — ABNORMAL HIGH (ref 0.00–0.07)
Basophils Absolute: 0 10*3/uL (ref 0.0–0.1)
Basophils Relative: 0 %
Eosinophils Absolute: 0.1 10*3/uL (ref 0.0–0.5)
Eosinophils Relative: 1 %
HCT: 31.7 % — ABNORMAL LOW (ref 36.0–46.0)
Hemoglobin: 10.1 g/dL — ABNORMAL LOW (ref 12.0–15.0)
Immature Granulocytes: 1 %
Lymphocytes Relative: 27 %
Lymphs Abs: 2.4 10*3/uL (ref 0.7–4.0)
MCH: 28 pg (ref 26.0–34.0)
MCHC: 31.9 g/dL (ref 30.0–36.0)
MCV: 87.8 fL (ref 80.0–100.0)
Monocytes Absolute: 0.8 10*3/uL (ref 0.1–1.0)
Monocytes Relative: 9 %
Neutro Abs: 5.3 10*3/uL (ref 1.7–7.7)
Neutrophils Relative %: 62 %
Platelets: 102 10*3/uL — ABNORMAL LOW (ref 150–400)
RBC: 3.61 MIL/uL — ABNORMAL LOW (ref 3.87–5.11)
RDW: 13.4 % (ref 11.5–15.5)
WBC: 8.7 10*3/uL (ref 4.0–10.5)
nRBC: 0 % (ref 0.0–0.2)

## 2019-11-18 LAB — GLUCOSE, CAPILLARY
Glucose-Capillary: 99 mg/dL (ref 70–99)
Glucose-Capillary: 108 mg/dL — ABNORMAL HIGH (ref 70–99)
Glucose-Capillary: 117 mg/dL — ABNORMAL HIGH (ref 70–99)
Glucose-Capillary: 94 mg/dL (ref 70–99)
Glucose-Capillary: 110 mg/dL — ABNORMAL HIGH (ref 70–99)
Glucose-Capillary: 122 mg/dL — ABNORMAL HIGH (ref 70–99)
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
## 2019-11-18 NOTE — TOC Progression Note (Signed)
Transition of Care [facility]) - Progression Note    Patient Details  Name: Joanne [name] MRN: [id number] Date of Birth: Oct 05, 1971  Transition of Care [facility]) CM/SW Contact  Margarito Liner, LCSW Phone Number: 11/18/2019, 1:41 PM  Clinical Narrative:  Received call from DSS social worker requesting clarification on information in APS report. Provided mother's name and phone number.   Expected Discharge Plan: Skilled Nursing Facility Barriers to Discharge: Continued Medical Work up  Expected Discharge Plan and Services Expected Discharge Plan: Skilled Nursing Facility   Discharge Planning Services: CM Consult   Living arrangements for the past 2 months: Skilled Nursing Facility                                       Social Determinants of Health (SDOH) Interventions    Readmission Risk Interventions No flowsheet data found.

## 2019-11-18 NOTE — Care Management Important Message (Signed)
Important Message  Patient Details  Name: Joanne [name] MRN: [id number] Date of Birth: 07/28/1971   Medicare Important Message Given:  Yes  Initial Medicare IM given by Patient Access Associate on 11/17/2019 at 10:06am.     [name] 11/18/2019, 8:22 AM

## 2019-11-19 LAB — BASIC METABOLIC PANEL
Anion gap: 8 (ref 5–15)
BUN: 11 mg/dL (ref 6–20)
CO2: 25 mmol/L (ref 22–32)
Calcium: 8 mg/dL — ABNORMAL LOW (ref 8.9–10.3)
Chloride: 114 mmol/L — ABNORMAL HIGH (ref 98–111)
Creatinine, Ser: 0.94 mg/dL (ref 0.44–1.00)
GFR calc Af Amer: >60 mL/min (ref >60)
GFR calc non Af Amer: >60 mL/min (ref >60)
Glucose, Bld: 99 mg/dL (ref 70–99)
Potassium: 2.5 mmol/L — CL (ref 3.5–5.1)
Sodium: 147 mmol/L — ABNORMAL HIGH (ref 135–145)

## 2019-11-19 LAB — GLUCOSE, CAPILLARY
Glucose-Capillary: 101 mg/dL — ABNORMAL HIGH (ref 70–99)
Glucose-Capillary: 102 mg/dL — ABNORMAL HIGH (ref 70–99)
Glucose-Capillary: 109 mg/dL — ABNORMAL HIGH (ref 70–99)
Glucose-Capillary: 137 mg/dL — ABNORMAL HIGH (ref 70–99)
Glucose-Capillary: 91 mg/dL (ref 70–99)
Glucose-Capillary: 95 mg/dL (ref 70–99)

## 2019-11-19 MED ORDER — CARBAMAZEPINE ER 100 MG PO TB12
100.0000 mg | ORAL_TABLET | Freq: Two times a day (BID) | ORAL | Status: DC
  Administered 2019-11-19 – 2019-11-20 (×4): 100 mg via ORAL
  Filled 2019-11-19 (×4): qty 1

## 2019-11-19 MED ORDER — FOLIC ACID 1 MG PO TABS
1.0000 mg | ORAL_TABLET | Freq: Every day | ORAL | Status: DC
  Administered 2019-11-19 – 2019-11-20 (×2): 1 mg via ORAL
  Filled 2019-11-19 (×2): qty 1

## 2019-11-19 MED ORDER — LEVOTHYROXINE SODIUM 100 MCG PO TABS
100.0000 ug | ORAL_TABLET | Freq: Every day | ORAL | Status: DC
  Administered 2019-11-20: 100 ug via ORAL
  Filled 2019-11-19: qty 1

## 2019-11-19 MED ORDER — ADULT MULTIVITAMIN LIQUID CH
15.0000 mL | Freq: Every day | ORAL | Status: DC
  Administered 2019-11-20: 09:00:00 15 mL via ORAL
  Filled 2019-11-19 (×2): qty 15

## 2019-11-19 MED ORDER — FAMOTIDINE 20 MG PO TABS
20.0000 mg | ORAL_TABLET | Freq: Two times a day (BID) | ORAL | Status: DC
  Administered 2019-11-19 – 2019-11-20 (×5): 20 mg via ORAL
  Filled 2019-11-19 (×4): qty 1

## 2019-11-19 MED ORDER — CEPHALEXIN 500 MG PO CAPS
500.0000 mg | ORAL_CAPSULE | Freq: Three times a day (TID) | ORAL | Status: DC
  Administered 2019-11-19 – 2019-11-20 (×4): 500 mg via ORAL
  Filled 2019-11-19 (×5): qty 1

## 2019-11-19 MED ORDER — POTASSIUM CHLORIDE CRYS ER 20 MEQ PO TBCR
40.0000 meq | EXTENDED_RELEASE_TABLET | Freq: Two times a day (BID) | ORAL | Status: AC
  Administered 2019-11-19 (×2): 40 meq via ORAL
  Filled 2019-11-19 (×2): qty 2

## 2019-11-19 MED ORDER — LORATADINE 10 MG PO TABS
10.0000 mg | ORAL_TABLET | Freq: Every day | ORAL | Status: DC
  Administered 2019-11-19 – 2019-11-20 (×2): 10 mg via ORAL
  Filled 2019-11-19 (×2): qty 1

## 2019-11-19 NOTE — TOC Progression Note (Addendum)
Transition of Care [facility]) - Progression Note    Patient Details  Name: Joanne [name] MRN: [id number] Date of Birth: May 03, 1971  Transition of Care [facility]) CM/SW Contact  Margarito Liner, LCSW Phone Number: 11/19/2019, 12:18 PM  Clinical Narrative: CSW called patient's aunt/legal guardian and confirmed plans to return to [facility] SN[facility] at discharge. Left voicemail for APS social worker to follow up and make sure this is okay.    1:26 pm: Received call back from APS social worker. As long as patient's aunt/guardian is okay with return to [facility], they have to follow that. Will call back once patient is discharging.  2:33 pm: Received call from [facility] admissions coordinator. Patient has [employer] Medicare through [facility] as of January 1st and will have to get insurance approval before she returns. MD will order PT and OT evaluations. Also asked her to order a new COVID test.  Expected Discharge Plan: Skilled Nursing Facility Barriers to Discharge: Continued Medical Work up  Expected Discharge Plan and Services Expected Discharge Plan: Skilled Nursing Facility   Discharge Planning Services: CM Consult   Living arrangements for the past 2 months: Skilled Nursing Facility                                       Social Determinants of Health (SDOH) Interventions    Readmission Risk Interventions No flowsheet data found.

## 2019-11-19 NOTE — Progress Notes (Signed)
PROGRESS NOTE    Joanne [name]  IWP:[id number] DOB: 12-Sep-1971 DOA: 11/14/2019 PCP: Patient, No Pcp Per       Assessment & Plan:   Principal Problem:   Sepsis secondary to UTI [facility]) Active Problems:   Tracheostomy status (HCC)   Traumatic brain injury (HCC)   Hypernatremia   Thrombocytopenia (HCC)   AKI (acute kidney injury) (HCC)   HTN (hypertension)   Non-insulin dependent type 2 diabetes mellitus (HCC)   Hypothyroidism   Sepsis (HCC)   Left hemiplegia (HCC)   Pressure injury of skin   Sepsis: secondary to e.coli UTI. Continue on keflex  Hypernatremia: free water deficit is 0.7L . Continue on D5W  Hypokalemia: KCl repleated. Will continue to monitor   AKI: likely pre-renal. Resolved  Tracheostomy:secondary to history of traumatic brain injury. Continue w/ tracheostomy care  Traumatic brain injury: w/ left hemiplegia, dysarthria. Continue on home dose of carbamazepine. Pt is at baseline  Thrombocytopenia: etiology unclear. Will continue to monitor   Normocytic anemia: etiology unclear. No need for a transfusion at this time. Will continue to monitor   HTN: will continue to hold home dose of coreg as BP low end of normal  DM2: continue on SSI w/ accuchecks   Hypothyroidism: continue home dose of  Levothyroxine  GERD: continue on home dose of famotidine   Allergies: possibly seasonal. Continue on home dose of loratadine   DVT prophylaxis: SCDs secondary to thrombocytopenia Code Status: full  Family Communication: called pt's legal guardian, [name], discussed pt's care and answered all of her questions  Disposition Plan:   Consultants:      Procedures:    Antimicrobials: keflex    Subjective: Pt c/o pain all over her body. Pt has chronic pain  Objective: Vitals:   11/18/19 0735 11/18/19 1518 11/18/19 2358 11/19/19 0442  BP: 96/69 (!) 92/57 91/61 (!) 92/59  Pulse: 89 67 85 76  Resp: 17 16 14 16   Temp: 98.4 F (36.9 C)  98.6 F (37 C) 99.6 F (37.6 C) 98.9 F (37.2 C)  TempSrc: Oral Oral Oral Oral  SpO2: 100% 97% 100% 100%  Weight:      Height:        Intake/Output Summary (Last 24 hours) at 11/19/2019 0748 Last data filed at 11/18/2019 1849 Gross per 24 hour  Intake --  Output 1450 ml  Net -1450 ml   Filed Weights   11/14/19 2306  Weight: 79.4 kg    Examination:  General exam: Appears calm but uncomfortable  Respiratory system: decreased breath sounds b/l Cardiovascular system: S1 & S2 +. No rubs, gallops or clicks.  Gastrointestinal system: Abdomen is nondistended, soft and nontender. Normal bowel sounds heard. Central nervous system: Awake and alert.  Psychiatry:  Flat mood and affect    Data Reviewed: I have personally reviewed following labs and imaging studies  CBC: Recent Labs  Lab 11/14/19 2312 11/14/19 2312 11/15/19 0308 11/16/19 0706 11/17/19 0628 11/17/19 1352 11/18/19 0719  WBC 11.1*   < > 12.0* 11.8* 9.4 8.3 8.7  NEUTROABS 8.0*  --   --  8.5* 4.9 4.7 5.3  HGB 14.3   < > 12.0 11.1* 10.8* 10.6* 10.1*  HCT 49.6*   < > 41.6 37.1 34.4* 35.6* 31.7*  MCV 98.8   < > 98.1 93.0 89.6 94.2 87.8  PLT 80*   < > 131* 109* 89* 101* 102*   < > = values in this interval not displayed.   Basic Metabolic Panel: Recent  Labs  Lab 11/16/19 2020 11/16/19 2224 11/17/19 0628 11/17/19 1011 11/19/19 0651  NA 162* 160* 157* 153* 147*  K 2.9* 3.3* 3.0* 4.0 2.5*  CL 130* 129* 125* 123* 114*  CO2 25 24 23  19* 25  GLUCOSE 204* 138* 111* 144* 99  BUN 33* 31* 28* 26* 11  CREATININE 1.08* 1.06* 1.02* 1.00 0.94  CALCIUM 8.6* 8.6* 8.3* 8.2* 8.0*   GFR: Estimated Creatinine Clearance: 78.5 mL/min (by C-G formula based on SCr of 0.94 mg/dL). Liver Function Tests: Recent Labs  Lab 11/14/19 2312  AST 49*  ALT 64*  ALKPHOS 170*  BILITOT 0.3  PROT 9.7*  ALBUMIN 3.2*   No results for input(s): LIPASE, AMYLASE in the last 168 hours. No results for input(s): AMMONIA in the last 168  hours. Coagulation Profile: Recent Labs  Lab 11/14/19 2312 11/15/19 0308  INR 1.2 1.4*   Cardiac Enzymes: Recent Labs  Lab 11/15/19 0308  CKTOTAL 44   BNP (last 3 results) No results for input(s): PROBNP in the last 8760 hours. HbA1C: No results for input(s): HGBA1C in the last 72 hours. CBG: Recent Labs  Lab 11/18/19 1643 11/18/19 2028 11/18/19 2119 11/19/19 0002 11/19/19 0444  GLUCAP 94 110* 122* 91 101*   Lipid Profile: No results for input(s): CHOL, HDL, LDLCALC, TRIG, CHOLHDL, LDLDIRECT in the last 72 hours. Thyroid Function Tests: No results for input(s): TSH, T4TOTAL, FREET4, T3FREE, THYROIDAB in the last 72 hours. Anemia Panel: No results for input(s): VITAMINB12, FOLATE, FERRITIN, TIBC, IRON, RETICCTPCT in the last 72 hours. Sepsis Labs: Recent Labs  Lab 11/14/19 2312 11/15/19 0001 11/15/19 0825  PROCALCITON  --   --  <0.10  LATICACIDVEN 1.8 2.3*  --     Recent Results (from the past 240 hour(s))  Urine culture     Status: Abnormal   Collection Time: 11/14/19 11:12 PM   Specimen: In/Out Cath Urine  Result Value Ref Range Status   Specimen Description   Final    IN/OUT CATH URINE Performed at [facility], [address] [place], [place]    Special Requests   Final    NONE Performed at [facility], [address] [place], [place]    Culture >=100,000 COLONIES/mL ESCHERICHIA COLI (A)  Final   Report Status 11/17/2019 FINAL  Final   Organism ID, Bacteria ESCHERICHIA COLI (A)  Final      Susceptibility   Escherichia coli - MIC*    AMPICILLIN >=32 RESISTANT Resistant     CEFAZOLIN <=4 SENSITIVE Sensitive     CEFTRIAXONE <=0.25 SENSITIVE Sensitive     CIPROFLOXACIN <=0.25 SENSITIVE Sensitive     GENTAMICIN <=1 SENSITIVE Sensitive     IMIPENEM <=0.25 SENSITIVE Sensitive     NITROFURANTOIN <=16 SENSITIVE Sensitive     TRIMETH/SULFA >=320 RESISTANT Resistant     AMPICILLIN/SULBACTAM 16 INTERMEDIATE  Intermediate     PIP/TAZO <=4 SENSITIVE Sensitive     * >=100,000 COLONIES/mL ESCHERICHIA COLI  Blood Culture (routine x 2)     Status: None (Preliminary result)   Collection Time: 11/14/19 11:53 PM   Specimen: BLOOD  Result Value Ref Range Status   Specimen Description BLOOD RIGHT ANTECUBITAL  Final   Special Requests   Final    BOTTLES DRAWN AEROBIC AND ANAEROBIC Blood Culture adequate volume   Culture   Final    NO GROWTH 4 DAYS Performed at [facility], [address] [place], [place]    Report Status  PENDING  Incomplete  Respiratory Panel by RT PCR (Flu A&B, Covid) - Nasopharyngeal Swab     Status: None   Collection Time: 11/15/19 12:00 AM   Specimen: Nasopharyngeal Swab  Result Value Ref Range Status   SARS Coronavirus 2 by RT PCR NEGATIVE NEGATIVE Final    Comment: (NOTE) SARS-CoV-2 target nucleic acids are NOT DETECTED. The SARS-CoV-2 RNA is generally detectable in upper respiratoy specimens during the acute phase of infection. The lowest concentration of SARS-CoV-2 viral copies this assay can detect is 131 copies/mL. A negative result does not preclude SARS-Cov-2 infection and should not be used as the sole basis for treatment or other patient management decisions. A negative result may occur with  improper specimen collection/handling, submission of specimen other than nasopharyngeal swab, presence of viral mutation(s) within the areas targeted by this assay, and inadequate number of viral copies (<131 copies/mL). A negative result must be combined with clinical observations, patient history, and epidemiological information. The expected result is Negative. Fact Sheet for Patients:  https://www.moore.com/ Fact Sheet for Healthcare Providers:  https://www.young.biz/ This test is not yet ap proved or cleared by the [place] FDA and  has been authorized for detection and/or diagnosis of SARS-CoV-2 by FDA  under an Emergency Use Authorization (EUA). This EUA will remain  in effect (meaning this test can be used) for the duration of the COVID-19 declaration under Section 564(b)(1) of the Act, 21 U.S.C. section 360bbb-3(b)(1), unless the authorization is terminated or revoked sooner.    Influenza A by PCR NEGATIVE NEGATIVE Final   Influenza B by PCR NEGATIVE NEGATIVE Final    Comment: (NOTE) The Xpert Xpress SARS-CoV-2/FLU/RSV assay is intended as an aid in  the diagnosis of influenza from Nasopharyngeal swab specimens and  should not be used as a sole basis for treatment. Nasal washings and  aspirates are unacceptable for Xpert Xpress SARS-CoV-2/FLU/RSV  testing. Fact Sheet for Patients: https://www.moore.com/ Fact Sheet for Healthcare Providers: https://www.young.biz/ This test is not yet approved or cleared by the [place] FDA and  has been authorized for detection and/or diagnosis of SARS-CoV-2 by  FDA under an Emergency Use Authorization (EUA). This EUA will remain  in effect (meaning this test can be used) for the duration of the  Covid-19 declaration under Section 564(b)(1) of the Act, 21  U.S.C. section 360bbb-3(b)(1), unless the authorization is  terminated or revoked. Performed at [facility], [address] [place], [place] [address]   Blood Culture (routine x 2)     Status: None (Preliminary result)   Collection Time: 11/15/19 12:10 AM   Specimen: BLOOD  Result Value Ref Range Status   Specimen Description BLOOD RIGHT ANTECUBITAL  Final   Special Requests   Final    BOTTLES DRAWN AEROBIC AND ANAEROBIC Blood Culture adequate volume   Culture   Final    NO GROWTH 4 DAYS Performed at [facility], [address] [place], [place] [address]    Report Status PENDING  Incomplete  MRSA PCR Screening     Status: Abnormal   Collection Time: 11/15/19  6:09 PM   Specimen: Nasal Mucosa; Nasopharyngeal  Result  Value Ref Range Status   MRSA by PCR POSITIVE (A) NEGATIVE Final    Comment:        The GeneXpert MRSA Assay (FDA approved for NASAL specimens only), is one component of a comprehensive MRSA colonization surveillance program. It is not intended to diagnose MRSA infection nor to guide or monitor treatment for  MRSA infections. RESULT CALLED TO, READ BACK BY AND VERIFIED WITH: MATT PAGE @1939  11/15/19 MJU Performed at [facility], [address] [place], [place] [address]          Radiology Studies: No results found.      Scheduled Meds: . Chlorhexidine Gluconate Cloth  6 each Topical Q0600  . Gerhardt's butt cream   Topical BID  . insulin aspart  0-15 Units Subcutaneous Q4H  . mupirocin ointment  1 application Nasal BID  . potassium chloride  40 mEq Oral BID  . sodium chloride flush  10-40 mL Intracatheter Q12H   Continuous Infusions: . cefTRIAXone (ROCEPHIN)  IV 1 g (11/18/19 2138)  . dextrose 100 mL/hr at 11/19/19 0640     LOS: 4 days    Time spent: 30 mins    Wyvonnia Dusky, MD [facility] Pager [phone]  If 7PM-7AM, please contact night-coverage [email or web address] Password [facility] 11/19/2019, 7:48 AM

## 2019-11-19 NOTE — Progress Notes (Signed)
PT Cancellation Note  Patient Details Name: Joanne [name] MRN: [id number] DOB: 10-Oct-1971   Cancelled Treatment:    Reason Eval/Treat Not Completed: Other (comment). Consult received and chart reviewed. Pt with low K+ at 2.5 this date. Contraindicated to work with therapy, will re-attempt.   [name] 11/19/2019, 3:03 PM Elizabeth Palau, PT, DPT [phone]
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
## 2019-11-19 NOTE — Progress Notes (Signed)
covid test  Pending results

## 2019-11-19 NOTE — Progress Notes (Signed)
Lab called. k 2.5  Per mirissa carver.  texted to md

## 2019-11-20 LAB — BASIC METABOLIC PANEL
Anion gap: 10 (ref 5–15)
BUN: 6 mg/dL (ref 6–20)
CO2: 24 mmol/L (ref 22–32)
Calcium: 8.1 mg/dL — ABNORMAL LOW (ref 8.9–10.3)
Chloride: 107 mmol/L (ref 98–111)
Creatinine, Ser: 0.74 mg/dL (ref 0.44–1.00)
GFR calc Af Amer: >60 mL/min (ref >60)
GFR calc non Af Amer: >60 mL/min (ref >60)
Glucose, Bld: 211 mg/dL — ABNORMAL HIGH (ref 70–99)
Potassium: 2.9 mmol/L — ABNORMAL LOW (ref 3.5–5.1)
Sodium: 141 mmol/L (ref 135–145)

## 2019-11-20 LAB — GLUCOSE, CAPILLARY
Glucose-Capillary: 106 mg/dL — ABNORMAL HIGH (ref 70–99)
Glucose-Capillary: 107 mg/dL — ABNORMAL HIGH (ref 70–99)
Glucose-Capillary: 121 mg/dL — ABNORMAL HIGH (ref 70–99)
Glucose-Capillary: 64 mg/dL — ABNORMAL LOW (ref 70–99)
Glucose-Capillary: 65 mg/dL — ABNORMAL LOW (ref 70–99)
Glucose-Capillary: 75 mg/dL (ref 70–99)
Glucose-Capillary: 91 mg/dL (ref 70–99)
Glucose-Capillary: 99 mg/dL (ref 70–99)
Glucose-Capillary: 99 mg/dL (ref 70–99)

## 2019-11-20 LAB — CBC
HCT: 30.5 % — ABNORMAL LOW (ref 36.0–46.0)
Hemoglobin: 9.6 g/dL — ABNORMAL LOW (ref 12.0–15.0)
MCH: 28.1 pg (ref 26.0–34.0)
MCHC: 31.5 g/dL (ref 30.0–36.0)
MCV: 89.2 fL (ref 80.0–100.0)
Platelets: 103 10*3/uL — ABNORMAL LOW (ref 150–400)
RBC: 3.42 MIL/uL — ABNORMAL LOW (ref 3.87–5.11)
RDW: 13.5 % (ref 11.5–15.5)
WBC: 7 10*3/uL (ref 4.0–10.5)
nRBC: 0 % (ref 0.0–0.2)

## 2019-11-20 LAB — CULTURE, BLOOD (ROUTINE X 2)
Culture: NO GROWTH
Culture: NO GROWTH
Special Requests: ADEQUATE
Special Requests: ADEQUATE

## 2019-11-20 LAB — SARS CORONAVIRUS 2 (TAT 6-24 HRS): SARS Coronavirus 2: NEGATIVE

## 2019-11-20 MED ORDER — ACETAMINOPHEN 325 MG PO TABS: 650.0000 mg | ORAL_TABLET | Freq: Four times a day (QID) | ORAL | Status: DC | PRN

## 2019-11-20 MED ORDER — POTASSIUM CHLORIDE CRYS ER 20 MEQ PO TBCR
40.0000 meq | EXTENDED_RELEASE_TABLET | Freq: Two times a day (BID) | ORAL | Status: AC
  Administered 2019-11-20 (×2): 40 meq via ORAL
  Filled 2019-11-20 (×2): qty 2
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
## 2019-11-20 NOTE — Discharge Summary (Signed)
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 should not be used as the sole basis for treatment or other patient management decisions. Negative results must be combined with clinical observations, patient history, and epidemiological information. The expected result is Negative. Fact Sheet for Patients: SugarRoll.be Fact Sheet for Healthcare Providers: https://www.woods-mathews.com/ This test is not yet approved or cleared by the [place] FDA and   has been authorized for detection and/or diagnosis of SARS-CoV-2 by FDA under an Emergency Use Authorization (EUA). This EUA will remain  in effect (meaning this test can be used) for the duration of the COVID-19 declaration under Section 56 4(b)(1) of the Act, 21 U.S.C. section 360bbb-3(b)(1), unless the authorization is terminated or revoked sooner. Performed at [facility], [place] [address] [place], [place] [address]      Time coordinating discharge: Over 30 minutes  SIGNED:   Wyvonnia Dusky, MD  [facility] 11/20/2019, 3:40 PM Pager   If 7PM-7AM, please contact night-coverage [email or web address] Password [email or web address]

## 2019-11-20 NOTE — TOC Transition Note (Signed)
Transition of Care [facility]) - CM/SW Discharge Note   Patient Details  Name: TALIA HOHEISEL MRN: [id number] Date of Birth: [birth year]  Transition of Care [facility]) CM/SW Contact:  Margarito Liner, LCSW Phone Number: 11/20/2019, 5:05 PM   Clinical Narrative: Discussed case with [facility] TOC Supervisor and confirmed it is still okay to send her back to [facility] today. RN will call report to [phone] (Room 13A) prior to setting up EMS transport. No further concerns. CSW signing off.    Final next level of care: Skilled Nursing Facility Barriers to Discharge: Barriers Resolved   Patient Goals and CMS Choice     Choice offered to / list presented to : [facility] POA / Guardian  Discharge Placement   Existing PASRR number confirmed : 11/20/19          Patient chooses bed at: [facility] Patient to be transferred to facility by: EMS Name of family member notified: [name] Patient and family notified of of transfer: 11/20/19  Discharge Plan and Services   Discharge Planning Services: CM Consult                                 Social Determinants of Health (SDOH) Interventions     Readmission Risk Interventions No flowsheet data found.

## 2019-11-20 NOTE — Progress Notes (Signed)
PROGRESS NOTE    Joanne [name]  OVF:[id number] DOB: 1971-07-29 DOA: 11/14/2019 PCP: Patient, No Pcp Per       Assessment & Plan:   Principal Problem:   Sepsis secondary to UTI [facility]) Active Problems:   Tracheostomy status (HCC)   Traumatic brain injury (HCC)   Hypernatremia   Thrombocytopenia (HCC)   AKI (acute kidney injury) (HCC)   HTN (hypertension)   Non-insulin dependent type 2 diabetes mellitus (HCC)   Hypothyroidism   Sepsis (HCC)   Left hemiplegia (HCC)   Pressure injury of skin   Sepsis: secondary to e.coli UTI. Continue on keflex (last day today)  Hypernatremia: resolved  Hypokalemia: KCl repleated again. Will continue to monitor   AKI: likely pre-renal. Resolved  Tracheostomy:secondary to history of traumatic brain injury. Continue w/ tracheostomy care  Traumatic brain injury: w/ left hemiplegia, dysarthria. Continue on home dose of carbamazepine. Pt is at baseline  Thrombocytopenia: etiology unclear. Will continue to monitor   Normocytic anemia: etiology unclear. No need for a transfusion at this time. Will continue to monitor   HTN: will continue to hold home dose of coreg as BP low end of normal  DM2: continue on SSI w/ accuchecks   Hypothyroidism: continue home dose of levothyroxine  GERD: continue on home dose of famotidine   Allergies: possibly seasonal. Continue on home dose of loratadine   Generalized weakness: PT/OT consulted and awaiting rec  DVT prophylaxis: SCDs secondary to thrombocytopenia Code Status: full  Family Communication:  Disposition Plan: likely in 24 hours if pt continues to clinically improve    Consultants:      Procedures:    Antimicrobials: keflex    Subjective: Pt c/o headache.   Objective: Vitals:   11/19/19 0442 11/19/19 0812 11/19/19 1603 11/20/19 0013  BP: (!) 92/59 90/65 95/63  100/73  Pulse: 76 76 77 83  Resp: 16 18 16 16   Temp: 98.9 F (37.2 C) 98.4 F (36.9 C) (!) 97.2 F (36.2  C) 98.2 F (36.8 C)  TempSrc: Oral Oral Oral Oral  SpO2: 100% 100% 100% 97%  Weight:      Height:        Intake/Output Summary (Last 24 hours) at 11/20/2019 0740 Last data filed at 11/19/2019 1837 Gross per 24 hour  Intake --  Output 1300 ml  Net -1300 ml   Filed Weights   11/14/19 2306  Weight: 79.4 kg    Examination:  General exam: Appears calm & comfortable  Respiratory system: diminished breath sounds b/l Cardiovascular system: S1 & S2 +. No rubs, gallops or clicks.  Gastrointestinal system: Abdomen is nondistended, soft and nontender. Normal bowel sounds heard. Central nervous system: Awake and alert. More talkative today  Psychiatry: Appropriate mood and affect    Data Reviewed: I have personally reviewed following labs and imaging studies  CBC: Recent Labs  Lab 11/14/19 2312 11/15/19 0308 11/16/19 0706 11/17/19 0628 11/17/19 1352 11/18/19 0719 11/20/19 0514  WBC 11.1*   < > 11.8* 9.4 8.3 8.7 7.0  NEUTROABS 8.0*  --  8.5* 4.9 4.7 5.3  --   HGB 14.3   < > 11.1* 10.8* 10.6* 10.1* 9.6*  HCT 49.6*   < > 37.1 34.4* 35.6* 31.7* 30.5*  MCV 98.8   < > 93.0 89.6 94.2 87.8 89.2  PLT 80*   < > 109* 89* 101* 102* 103*   < > = values in this interval not displayed.   Basic Metabolic Panel: Recent Labs  Lab 11/16/19 2224 11/17/19  2202 11/17/19 1011 11/19/19 0651 11/20/19 0514  NA 160* 157* 153* 147* 141  K 3.3* 3.0* 4.0 2.5* 2.9*  CL 129* 125* 123* 114* 107  CO2 24 23 19* 25 24  GLUCOSE 138* 111* 144* 99 211*  BUN 31* 28* 26* 11 6  CREATININE 1.06* 1.02* 1.00 0.94 0.74  CALCIUM 8.6* 8.3* 8.2* 8.0* 8.1*   GFR: Estimated Creatinine Clearance: 92.3 mL/min (by C-G formula based on SCr of 0.74 mg/dL). Liver Function Tests: Recent Labs  Lab 11/14/19 2312  AST 49*  ALT 64*  ALKPHOS 170*  BILITOT 0.3  PROT 9.7*  ALBUMIN 3.2*   No results for input(s): LIPASE, AMYLASE in the last 168 hours. No results for input(s): AMMONIA in the last 168  hours. Coagulation Profile: Recent Labs  Lab 11/14/19 2312 11/15/19 0308  INR 1.2 1.4*   Cardiac Enzymes: Recent Labs  Lab 11/15/19 0308  CKTOTAL 44   BNP (last 3 results) No results for input(s): PROBNP in the last 8760 hours. HbA1C: No results for input(s): HGBA1C in the last 72 hours. CBG: Recent Labs  Lab 11/19/19 1156 11/19/19 1643 11/19/19 2009 11/20/19 0016 11/20/19 0328  GLUCAP 137* 102* 95 107* 99   Lipid Profile: No results for input(s): CHOL, HDL, LDLCALC, TRIG, CHOLHDL, LDLDIRECT in the last 72 hours. Thyroid Function Tests: No results for input(s): TSH, T4TOTAL, FREET4, T3FREE, THYROIDAB in the last 72 hours. Anemia Panel: No results for input(s): VITAMINB12, FOLATE, FERRITIN, TIBC, IRON, RETICCTPCT in the last 72 hours. Sepsis Labs: Recent Labs  Lab 11/14/19 2312 11/15/19 0001 11/15/19 0825  PROCALCITON  --   --  <0.10  LATICACIDVEN 1.8 2.3*  --     Recent Results (from the past 240 hour(s))  Urine culture     Status: Abnormal   Collection Time: 11/14/19 11:12 PM   Specimen: In/Out Cath Urine  Result Value Ref Range Status   Specimen Description   Final    IN/OUT CATH URINE Performed at [facility], [address] [place], [place] [address]    Special Requests   Final    NONE Performed at [facility], [address] [place], [place] [address]    Culture >=100,000 COLONIES/mL ESCHERICHIA COLI (A)  Final   Report Status 11/17/2019 FINAL  Final   Organism ID, Bacteria ESCHERICHIA COLI (A)  Final      Susceptibility   Escherichia coli - MIC*    AMPICILLIN >=32 RESISTANT Resistant     CEFAZOLIN <=4 SENSITIVE Sensitive     CEFTRIAXONE <=0.25 SENSITIVE Sensitive     CIPROFLOXACIN <=0.25 SENSITIVE Sensitive     GENTAMICIN <=1 SENSITIVE Sensitive     IMIPENEM <=0.25 SENSITIVE Sensitive     NITROFURANTOIN <=16 SENSITIVE Sensitive     TRIMETH/SULFA >=320 RESISTANT Resistant     AMPICILLIN/SULBACTAM 16 INTERMEDIATE  Intermediate     PIP/TAZO <=4 SENSITIVE Sensitive     * >=100,000 COLONIES/mL ESCHERICHIA COLI  Blood Culture (routine x 2)     Status: None   Collection Time: 11/14/19 11:53 PM   Specimen: BLOOD  Result Value Ref Range Status   Specimen Description BLOOD RIGHT ANTECUBITAL  Final   Special Requests   Final    BOTTLES DRAWN AEROBIC AND ANAEROBIC Blood Culture adequate volume   Culture   Final    NO GROWTH 5 DAYS Performed at [facility], [address], [place], [place] [address]    Report Status 11/20/2019 FINAL  Final  Respiratory Panel by  RT PCR (Flu A&B, Covid) - Nasopharyngeal Swab     Status: None   Collection Time: 11/15/19 12:00 AM   Specimen: Nasopharyngeal Swab  Result Value Ref Range Status   SARS Coronavirus 2 by RT PCR NEGATIVE NEGATIVE Final    Comment: (NOTE) SARS-CoV-2 target nucleic acids are NOT DETECTED. The SARS-CoV-2 RNA is generally detectable in upper respiratoy specimens during the acute phase of infection. The lowest concentration of SARS-CoV-2 viral copies this assay can detect is 131 copies/mL. A negative result does not preclude SARS-Cov-2 infection and should not be used as the sole basis for treatment or other patient management decisions. A negative result may occur with  improper specimen collection/handling, submission of specimen other than nasopharyngeal swab, presence of viral mutation(s) within the areas targeted by this assay, and inadequate number of viral copies (<131 copies/mL). A negative result must be combined with clinical observations, patient history, and epidemiological information. The expected result is Negative. Fact Sheet for Patients:  https://www.moore.com/ Fact Sheet for Healthcare Providers:  https://www.young.biz/ This test is not yet ap proved or cleared by the [place] FDA and  has been authorized for detection and/or diagnosis of SARS-CoV-2 by FDA under an Emergency  Use Authorization (EUA). This EUA will remain  in effect (meaning this test can be used) for the duration of the COVID-19 declaration under Section 564(b)(1) of the Act, 21 U.S.C. section 360bbb-3(b)(1), unless the authorization is terminated or revoked sooner.    Influenza A by PCR NEGATIVE NEGATIVE Final   Influenza B by PCR NEGATIVE NEGATIVE Final    Comment: (NOTE) The Xpert Xpress SARS-CoV-2/FLU/RSV assay is intended as an aid in  the diagnosis of influenza from Nasopharyngeal swab specimens and  should not be used as a sole basis for treatment. Nasal washings and  aspirates are unacceptable for Xpert Xpress SARS-CoV-2/FLU/RSV  testing. Fact Sheet for Patients: https://www.moore.com/ Fact Sheet for Healthcare Providers: https://www.young.biz/ This test is not yet approved or cleared by the [place] FDA and  has been authorized for detection and/or diagnosis of SARS-CoV-2 by  FDA under an Emergency Use Authorization (EUA). This EUA will remain  in effect (meaning this test can be used) for the duration of the  Covid-19 declaration under Section 564(b)(1) of the Act, 21  U.S.C. section 360bbb-3(b)(1), unless the authorization is  terminated or revoked. Performed at [facility], [address] [place], [place] [address]   Blood Culture (routine x 2)     Status: None   Collection Time: 11/15/19 12:10 AM   Specimen: BLOOD  Result Value Ref Range Status   Specimen Description BLOOD RIGHT ANTECUBITAL  Final   Special Requests   Final    BOTTLES DRAWN AEROBIC AND ANAEROBIC Blood Culture adequate volume   Culture   Final    NO GROWTH 5 DAYS Performed at [facility], [address] [place], [place] [address]    Report Status 11/20/2019 FINAL  Final  MRSA PCR Screening     Status: Abnormal   Collection Time: 11/15/19  6:09 PM   Specimen: Nasal Mucosa; Nasopharyngeal  Result Value Ref Range Status   MRSA by PCR  POSITIVE (A) NEGATIVE Final    Comment:        The GeneXpert MRSA Assay (FDA approved for NASAL specimens only), is one component of a comprehensive MRSA colonization surveillance program. It is not intended to diagnose MRSA infection nor to guide or monitor treatment for MRSA infections. RESULT CALLED TO, READ BACK BY  AND VERIFIED WITH: MATT PAGE @1939  11/15/19 MJU Performed at [facility], [place], [place] [address]   SARS CORONAVIRUS 2 (TAT 6-24 HRS) Nasopharyngeal Nasopharyngeal Swab     Status: None   Collection Time: 11/19/19  7:00 PM   Specimen: Nasopharyngeal Swab  Result Value Ref Range Status   SARS Coronavirus 2 NEGATIVE NEGATIVE Final    Comment: (NOTE) SARS-CoV-2 target nucleic acids are NOT DETECTED. The SARS-CoV-2 RNA is generally detectable in upper and lower respiratory specimens during the acute phase of infection. Negative results do not preclude SARS-CoV-2 infection, do not rule out co-infections with other pathogens, and should not be used as the sole basis for treatment or other patient management decisions. Negative results must be combined with clinical observations, patient history, and epidemiological information. The expected result is Negative. Fact Sheet for Patients: SugarRoll.be Fact Sheet for Healthcare Providers: https://www.woods-mathews.com/ This test is not yet approved or cleared by the [place] FDA and  has been authorized for detection and/or diagnosis of SARS-CoV-2 by FDA under an Emergency Use Authorization (EUA). This EUA will remain  in effect (meaning this test can be used) for the duration of the COVID-19 declaration under Section 56 4(b)(1) of the Act, 21 U.S.C. section 360bbb-3(b)(1), unless the authorization is terminated or revoked sooner. Performed at [facility], [place] [address], [place], [place] [address]          Radiology Studies: No  results found.      Scheduled Meds: . carbamazepine  100 mg Oral BID  . cephALEXin  500 mg Oral Q8H  . Chlorhexidine Gluconate Cloth  6 each Topical Q0600  . famotidine  20 mg Oral BID  . folic acid  1 mg Oral Daily  . Gerhardt's butt cream   Topical BID  . insulin aspart  0-15 Units Subcutaneous Q4H  . levothyroxine  100 mcg Oral Q0600  . loratadine  10 mg Oral Daily  . multivitamin  15 mL Oral Daily  . mupirocin ointment  1 application Nasal BID  . potassium chloride  40 mEq Oral BID  . sodium chloride flush  10-40 mL Intracatheter Q12H   Continuous Infusions: . dextrose 100 mL/hr at 11/20/19 0515     LOS: 5 days    Time spent: 30 mins    Wyvonnia Dusky, MD [facility] Pager [phone]  If 7PM-7AM, please contact night-coverage [email or web address] Password [email or web address] 11/20/2019, 7:40 AM
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
## 2019-11-20 NOTE — Care Management Important Message (Signed)
Important Message  Patient Details  Name: Joanne [name] MRN: [id number] Date of Birth: [birth year]   Medicare Important Message Given:  Yes     [name] A Ruthetta Koopmann 11/20/2019, 11:02 AM

## 2019-11-20 NOTE — NC FL2 (Signed)
MEDICAID FL2 LEVEL OF CARE SCREENING TOOL     IDENTIFICATION  Patient Name: Joanne [name] Birthdate: 1971/08/11 Sex: female Admission Date (Current Location): 11/14/2019  [place] and [place] Number:  Chiropodist and Address:  [facility], [address] [place], [place] [address]      Provider Number: [id number]  Attending Physician Name and Address:  Charise Killian, MD  Relative Name and Phone Number:       Current Level of Care: Hospital Recommended Level of Care: Skilled Nursing Facility Prior Approval Number:    Date Approved/Denied:   PASRR Number: [id number]  Discharge Plan: SNF    Current Diagnoses: Patient Active Problem List   Diagnosis Date Noted  . Hypernatremia 11/15/2019  . Thrombocytopenia (HCC) 11/15/2019  . AKI (acute kidney injury) (HCC) 11/15/2019  . HTN (hypertension) 11/15/2019  . Non-insulin dependent type 2 diabetes mellitus (HCC) 11/15/2019  . Hypothyroidism 11/15/2019  . Sepsis (HCC) 11/15/2019  . Left hemiplegia (HCC) 11/15/2019  . Pressure injury of skin 11/15/2019  . Aspiration pneumonia due to gastric secretions (HCC)   . Acute kidney injury (AKI) with acute tubular necrosis (ATN) (HCC)   . Traumatic brain injury (HCC)   . Acute on chronic respiratory failure with hypoxia (HCC) 06/06/2018  . 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 Pneumonia due to Klebsiella pneumoniae (HCC) 06/06/2018  . Acute on chronic combined systolic and diastolic CHF (congestive heart failure) (HCC) 06/06/2018  . Sepsis secondary to UTI (HCC) 06/06/2018  . ARDS (adult respiratory distress syndrome) (HCC) 06/06/2018  . Acute on chronic respiratory failure with hypoxia (HCC) 06/06/2018  . 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    Orientation RESPIRATION BLADDER Height & Weight     Self, Time, Place  Tracheostomy, Normal(Has had trach for several months at least.) Incontinent, External catheter Weight: 175 lb  (79.4 kg) Height:  5\' 7"  (170.2 cm)  BEHAVIORAL SYMPTOMS/MOOD NEUROLOGICAL BOWEL NUTRITION STATUS  (None) (TBI in 1987) Continent Diet(DYS 3. Extra gravy on meats, potatoes.)  AMBULATORY STATUS COMMUNICATION OF NEEDS Skin   Extensive Assist Verbally PU Stage and Appropriate Care     PU Stage 3 Dressing: (Coccyx: Foam.)                 Personal Care Assistance Level of Assistance  Bathing, Feeding, Dressing Bathing Assistance: Maximum assistance Feeding assistance: Maximum assistance Dressing Assistance: Maximum assistance Total Care Assistance: Maximum assistance   Functional Limitations Info  Sight, Speech, Hearing Sight Info: Adequate Hearing Info: Adequate Speech Info: Impaired(Expressive aphasia)    SPECIAL CARE FACTORS FREQUENCY  PT (By licensed PT), OT (By licensed OT)     PT Frequency: 5 x week OT Frequency: 5 x week            Contractures Contractures Info: Present    Additional Factors Info  Code Status, Allergies Code Status Info: Full Allergies Info: Kaopectate (Attapulgite), Thiopental, Tomato.     Isolation Precautions Info: airborne- COVID +     Current Medications (11/20/2019):  This is the current hospital active medication list Current Facility-Administered Medications  Medication Dose Route Frequency Provider Last Rate Last Admin  . acetaminophen (TYLENOL) tablet 650 mg  650 mg Oral Q6H PRN 11/22/2019, MD   650 mg at 11/19/19 1047   Or  . acetaminophen (TYLENOL) suppository 650 mg  650 mg Rectal Q6H PRN 11/21/19, MD      . carbamazepine (TEGRETOL XR) 12 hr tablet 100 mg  100  mg Oral BID Wyvonnia Dusky, MD   100 mg at 11/20/19 6834  . cephALEXin (KEFLEX) capsule 500 mg  500 mg Oral Q8H Wyvonnia Dusky, MD   500 mg at 11/20/19 0507  . Chlorhexidine Gluconate Cloth 2 % PADS 6 each  6 each Topical Q0600 Nolberto Hanlon, MD   6 each at 11/20/19 0525  . dextrose 5 % solution   Intravenous Continuous Ralene Muskrat B, MD 100  mL/hr at 11/20/19 0515 New Bag at 11/20/19 0515  . famotidine (PEPCID) tablet 20 mg  20 mg Oral BID Wyvonnia Dusky, MD   20 mg at 11/20/19 1962  . folic acid (FOLVITE) tablet 1 mg  1 mg Oral Daily Wyvonnia Dusky, MD   1 mg at 11/20/19 2297  . Gerhardt's butt cream   Topical BID Nolberto Hanlon, MD   Given at 11/20/19 [phone]  . insulin aspart (novoLOG) injection 0-15 Units  0-15 Units Subcutaneous Q4H Athena Masse, MD   2 Units at 11/20/19 1213  . levothyroxine (SYNTHROID) tablet 100 mcg  100 mcg Oral Q0600 Wyvonnia Dusky, MD   100 mcg at 11/20/19 0507  . loratadine (CLARITIN) tablet 10 mg  10 mg Oral Daily Wyvonnia Dusky, MD   10 mg at 11/20/19 0917  . multivitamin liquid 15 mL  15 mL Oral Daily Wyvonnia Dusky, MD   15 mL at 11/20/19 0917  . mupirocin ointment (BACTROBAN) 2 % 1 application  1 application Nasal BID Nolberto Hanlon, MD   1 application at 11/94/17 [phone]  . ondansetron (ZOFRAN) tablet 4 mg  4 mg Oral Q6H PRN Athena Masse, MD       Or  . ondansetron [facility]) injection 4 mg  4 mg Intravenous Q6H PRN Athena Masse, MD      . potassium chloride SA (KLOR-CON) CR tablet 40 mEq  40 mEq Oral BID Wyvonnia Dusky, MD   40 mEq at 11/20/19 0917  . senna-docusate (Senokot-S) tablet 1 tablet  1 tablet Oral QHS PRN Athena Masse, MD   1 tablet at 11/19/19 2151  . sodium chloride flush (NS) 0.9 % injection 10-40 mL  10-40 mL Intracatheter Q12H Nolberto Hanlon, MD   10 mL at 11/20/19 0918  . sodium chloride flush (NS) 0.9 % injection 10-40 mL  10-40 mL Intracatheter PRN Nolberto Hanlon, MD         Discharge Medications: Please see discharge summary for a list of discharge medications.  Relevant Imaging Results:  Relevant Lab Results:   Additional Information SS#: [id number]. Has [employer] Medicare through [place] as of 10/24/2019: [id number]. Has been at [facility] for rehab since 10/16/2019. Plan to apply for Medicaid for LTC but aunt/guardian said she is currently  in the spend down process.  Candie Chroman, LCSW

## 2019-11-20 NOTE — Plan of Care (Signed)
  Problem: Health Behavior/Discharge Planning: Goal: Ability to manage health-related needs will improve Outcome: Progressing   Problem: Clinical Measurements: Goal: Ability to maintain clinical measurements within normal limits will improve Outcome: Progressing   Problem: Nutrition: Goal: Adequate nutrition will be maintained Outcome: Progressing   Problem: Elimination: Goal: Will not experience complications related to bowel motility Outcome: Progressing   

## 2020-03-02 ENCOUNTER — Emergency Department: Payer: Medicare PPO

## 2020-03-02 ENCOUNTER — Inpatient Hospital Stay
Admission: EM | Admit: 2020-03-02 | Discharge: 2020-03-16 | DRG: 871 | Disposition: A | Discharge status: Skilled Nursing Facility | Payer: Medicare PPO | Source: Skilled Nursing Facility | Attending: Internal Medicine | Admitting: Internal Medicine

## 2020-03-02 ENCOUNTER — Encounter: Payer: Self-pay | Admitting: Emergency Medicine

## 2020-03-02 ENCOUNTER — Other Ambulatory Visit: Payer: Self-pay

## 2020-03-02 DIAGNOSIS — D649 Anemia, unspecified: Secondary | ICD-10-CM | POA: Diagnosis present

## 2020-03-02 DIAGNOSIS — Z79890 Hormone replacement therapy (postmenopausal): Secondary | ICD-10-CM | POA: Diagnosis not present

## 2020-03-02 DIAGNOSIS — E440 Moderate protein-calorie malnutrition: Secondary | ICD-10-CM | POA: Diagnosis present

## 2020-03-02 DIAGNOSIS — A419 Sepsis, unspecified organism: Secondary | ICD-10-CM | POA: Diagnosis not present

## 2020-03-02 DIAGNOSIS — Z87820 Personal history of traumatic brain injury: Secondary | ICD-10-CM | POA: Diagnosis not present

## 2020-03-02 DIAGNOSIS — Z20822 Contact with and (suspected) exposure to covid-19: Secondary | ICD-10-CM | POA: Diagnosis present

## 2020-03-02 DIAGNOSIS — R6521 Severe sepsis with septic shock: Secondary | ICD-10-CM | POA: Diagnosis present

## 2020-03-02 DIAGNOSIS — E870 Hyperosmolality and hypernatremia: Secondary | ICD-10-CM | POA: Diagnosis present

## 2020-03-02 DIAGNOSIS — Z515 Encounter for palliative care: Secondary | ICD-10-CM

## 2020-03-02 DIAGNOSIS — L89629 Pressure ulcer of left heel, unspecified stage: Secondary | ICD-10-CM | POA: Diagnosis present

## 2020-03-02 DIAGNOSIS — Z7982 Long term (current) use of aspirin: Secondary | ICD-10-CM

## 2020-03-02 DIAGNOSIS — L89153 Pressure ulcer of sacral region, stage 3: Secondary | ICD-10-CM | POA: Diagnosis present

## 2020-03-02 DIAGNOSIS — N179 Acute kidney failure, unspecified: Secondary | ICD-10-CM | POA: Diagnosis present

## 2020-03-02 DIAGNOSIS — N390 Urinary tract infection, site not specified: Secondary | ICD-10-CM | POA: Diagnosis present

## 2020-03-02 DIAGNOSIS — A400 Sepsis due to streptococcus, group A: Secondary | ICD-10-CM | POA: Diagnosis not present

## 2020-03-02 DIAGNOSIS — R6520 Severe sepsis without septic shock: Secondary | ICD-10-CM | POA: Diagnosis not present

## 2020-03-02 DIAGNOSIS — K760 Fatty (change of) liver, not elsewhere classified: Secondary | ICD-10-CM | POA: Diagnosis present

## 2020-03-02 DIAGNOSIS — L89150 Pressure ulcer of sacral region, unstageable: Secondary | ICD-10-CM | POA: Diagnosis not present

## 2020-03-02 DIAGNOSIS — G40909 Epilepsy, unspecified, not intractable, without status epilepticus: Secondary | ICD-10-CM | POA: Diagnosis present

## 2020-03-02 DIAGNOSIS — Z930 Tracheostomy status: Secondary | ICD-10-CM | POA: Diagnosis not present

## 2020-03-02 DIAGNOSIS — E039 Hypothyroidism, unspecified: Secondary | ICD-10-CM | POA: Diagnosis present

## 2020-03-02 DIAGNOSIS — T3695XA Adverse effect of unspecified systemic antibiotic, initial encounter: Secondary | ICD-10-CM | POA: Diagnosis not present

## 2020-03-02 DIAGNOSIS — Z7189 Other specified counseling: Secondary | ICD-10-CM

## 2020-03-02 DIAGNOSIS — L89154 Pressure ulcer of sacral region, stage 4: Secondary | ICD-10-CM | POA: Diagnosis present

## 2020-03-02 DIAGNOSIS — K521 Toxic gastroenteritis and colitis: Secondary | ICD-10-CM | POA: Diagnosis not present

## 2020-03-02 DIAGNOSIS — I110 Hypertensive heart disease with heart failure: Secondary | ICD-10-CM | POA: Diagnosis present

## 2020-03-02 DIAGNOSIS — J690 Pneumonitis due to inhalation of food and vomit: Secondary | ICD-10-CM | POA: Diagnosis present

## 2020-03-02 DIAGNOSIS — B955 Unspecified streptococcus as the cause of diseases classified elsewhere: Secondary | ICD-10-CM | POA: Diagnosis not present

## 2020-03-02 DIAGNOSIS — G92 Toxic encephalopathy: Secondary | ICD-10-CM | POA: Diagnosis present

## 2020-03-02 DIAGNOSIS — E119 Type 2 diabetes mellitus without complications: Secondary | ICD-10-CM | POA: Diagnosis present

## 2020-03-02 DIAGNOSIS — Z79899 Other long term (current) drug therapy: Secondary | ICD-10-CM

## 2020-03-02 DIAGNOSIS — K439 Ventral hernia without obstruction or gangrene: Secondary | ICD-10-CM | POA: Diagnosis present

## 2020-03-02 DIAGNOSIS — E222 Syndrome of inappropriate secretion of antidiuretic hormone: Secondary | ICD-10-CM | POA: Diagnosis present

## 2020-03-02 DIAGNOSIS — I5042 Chronic combined systolic (congestive) and diastolic (congestive) heart failure: Secondary | ICD-10-CM | POA: Diagnosis present

## 2020-03-02 DIAGNOSIS — L89212 Pressure ulcer of right hip, stage 2: Secondary | ICD-10-CM | POA: Diagnosis present

## 2020-03-02 DIAGNOSIS — E876 Hypokalemia: Secondary | ICD-10-CM | POA: Diagnosis not present

## 2020-03-02 DIAGNOSIS — B9620 Unspecified Escherichia coli [E. coli] as the cause of diseases classified elsewhere: Secondary | ICD-10-CM | POA: Diagnosis present

## 2020-03-02 DIAGNOSIS — L98429 Non-pressure chronic ulcer of back with unspecified severity: Secondary | ICD-10-CM | POA: Diagnosis not present

## 2020-03-02 DIAGNOSIS — R7881 Bacteremia: Secondary | ICD-10-CM | POA: Diagnosis not present

## 2020-03-02 LAB — SARS CORONAVIRUS 2 BY RT PCR (HOSPITAL ORDER, PERFORMED IN ~~LOC~~ HOSPITAL LAB): SARS Coronavirus 2: NEGATIVE

## 2020-03-02 LAB — URINALYSIS, ROUTINE W REFLEX MICROSCOPIC
Bilirubin Urine: NEGATIVE
Glucose, UA: NEGATIVE mg/dL
Ketones, ur: NEGATIVE mg/dL
Nitrite: NEGATIVE
Protein, ur: 100 mg/dL — AB
Specific Gravity, Urine: 1.014 (ref 1.005–1.030)
pH: 6 (ref 5.0–8.0)

## 2020-03-02 LAB — CBC WITH DIFFERENTIAL/PLATELET
Abs Immature Granulocytes: 0.88 10*3/uL — ABNORMAL HIGH (ref 0.00–0.07)
Basophils Absolute: 0 10*3/uL (ref 0.0–0.1)
Basophils Relative: 0 %
Eosinophils Absolute: 0 10*3/uL (ref 0.0–0.5)
Eosinophils Relative: 0 %
HCT: 44.4 % (ref 36.0–46.0)
Hemoglobin: 13.9 g/dL (ref 12.0–15.0)
Immature Granulocytes: 3 %
Lymphocytes Relative: 3 %
Lymphs Abs: 0.8 10*3/uL (ref 0.7–4.0)
MCH: 27.3 pg (ref 26.0–34.0)
MCHC: 31.3 g/dL (ref 30.0–36.0)
MCV: 87.2 fL (ref 80.0–100.0)
Monocytes Absolute: 0.9 10*3/uL (ref 0.1–1.0)
Monocytes Relative: 3 %
Neutro Abs: 27.5 10*3/uL — ABNORMAL HIGH (ref 1.7–7.7)
Neutrophils Relative %: 91 %
Platelets: 221 10*3/uL (ref 150–400)
RBC: 5.09 MIL/uL (ref 3.87–5.11)
RDW: 14.7 % (ref 11.5–15.5)
Smear Review: NORMAL
WBC: 30 10*3/uL — ABNORMAL HIGH (ref 4.0–10.5)
nRBC: 0.1 % (ref 0.0–0.2)

## 2020-03-02 LAB — COMPREHENSIVE METABOLIC PANEL
ALT: 13 U/L (ref 0–44)
AST: 20 U/L (ref 15–41)
Albumin: 2.3 g/dL — ABNORMAL LOW (ref 3.5–5.0)
Alkaline Phosphatase: 120 U/L (ref 38–126)
Anion gap: 13 (ref 5–15)
BUN: 79 mg/dL — ABNORMAL HIGH (ref 6–20)
CO2: 26 mmol/L (ref 22–32)
Calcium: 9.4 mg/dL (ref 8.9–10.3)
Chloride: 118 mmol/L — ABNORMAL HIGH (ref 98–111)
Creatinine, Ser: 1.85 mg/dL — ABNORMAL HIGH (ref 0.44–1.00)
GFR calc Af Amer: 36 mL/min — ABNORMAL LOW (ref >60)
GFR calc non Af Amer: 31 mL/min — ABNORMAL LOW (ref >60)
Glucose, Bld: 213 mg/dL — ABNORMAL HIGH (ref 70–99)
Potassium: 3.8 mmol/L (ref 3.5–5.1)
Sodium: 157 mmol/L — ABNORMAL HIGH (ref 135–145)
Total Bilirubin: 0.9 mg/dL (ref 0.3–1.2)
Total Protein: 9.3 g/dL — ABNORMAL HIGH (ref 6.5–8.1)

## 2020-03-02 LAB — BRAIN NATRIURETIC PEPTIDE: B Natriuretic Peptide: 186 pg/mL — ABNORMAL HIGH (ref 0.0–100.0)

## 2020-03-02 LAB — PROCALCITONIN: Procalcitonin: 7.11 ng/mL

## 2020-03-02 LAB — LACTIC ACID, PLASMA: Lactic Acid, Venous: 2.3 mmol/L (ref 0.5–1.9)

## 2020-03-02 LAB — PREGNANCY, URINE: Preg Test, Ur: NEGATIVE

## 2020-03-02 MED ORDER — ACETAMINOPHEN 160 MG/5ML PO SOLN
650.0000 mg | Freq: Once | ORAL | Status: AC
  Administered 2020-03-02: 650 mg via ORAL
  Filled 2020-03-02: qty 20.3

## 2020-03-02 MED ORDER — SODIUM CHLORIDE 0.9 % IV SOLN
2.0000 g | Freq: Once | INTRAVENOUS | Status: AC
  Administered 2020-03-02: 2 g via INTRAVENOUS
  Filled 2020-03-02: qty 2

## 2020-03-02 MED ORDER — NOREPINEPHRINE 4 MG/250ML-% IV SOLN
0.0000 ug/min | INTRAVENOUS | Status: DC
  Administered 2020-03-02: 5 ug/min via INTRAVENOUS
  Filled 2020-03-02: qty 250

## 2020-03-02 MED ORDER — LACTATED RINGERS IV BOLUS (SEPSIS)
1000.0000 mL | Freq: Once | INTRAVENOUS | Status: AC
  Administered 2020-03-02: 1000 mL via INTRAVENOUS

## 2020-03-02 MED ORDER — ONDANSETRON HCL 4 MG/2ML IJ SOLN: 4.0000 mg | Freq: Four times a day (QID) | INTRAMUSCULAR | Status: DC | PRN

## 2020-03-02 MED ORDER — PIPERACILLIN-TAZOBACTAM 3.375 G IVPB
3.3750 g | Freq: Three times a day (TID) | INTRAVENOUS | Status: DC
  Administered 2020-03-03 (×2): 3.375 g via INTRAVENOUS
  Filled 2020-03-02 (×4): qty 50

## 2020-03-02 MED ORDER — LEVOTHYROXINE SODIUM 100 MCG PO TABS
100.0000 ug | ORAL_TABLET | Freq: Every day | ORAL | Status: DC
  Administered 2020-03-05 – 2020-03-16 (×12): 100 ug via ORAL
  Filled 2020-03-02 (×13): qty 1

## 2020-03-02 MED ORDER — IOHEXOL 300 MG/ML  SOLN
75.0000 mL | Freq: Once | INTRAMUSCULAR | Status: AC | PRN
  Administered 2020-03-02: 75 mL via INTRAVENOUS

## 2020-03-02 MED ORDER — LACTATED RINGERS IV BOLUS (SEPSIS)
500.0000 mL | Freq: Once | INTRAVENOUS | Status: AC
  Administered 2020-03-02: 17:00:00 500 mL via INTRAVENOUS

## 2020-03-02 MED ORDER — LACTATED RINGERS IV BOLUS (SEPSIS)
1000.0000 mL | Freq: Once | INTRAVENOUS | Status: AC
  Administered 2020-03-02: 16:00:00 1000 mL via INTRAVENOUS

## 2020-03-02 MED ORDER — SODIUM CHLORIDE 0.9 % IV SOLN
250.0000 mL | INTRAVENOUS | Status: DC | PRN
  Administered 2020-03-03 – 2020-03-10 (×5): 250 mL via INTRAVENOUS
  Administered 2020-03-11: 75 mL via INTRAVENOUS
  Administered 2020-03-13: 30 mL via INTRAVENOUS

## 2020-03-02 MED ORDER — VANCOMYCIN HCL IN DEXTROSE 1-5 GM/200ML-% IV SOLN
1000.0000 mg | Freq: Once | INTRAVENOUS | Status: AC
  Administered 2020-03-02: 1000 mg via INTRAVENOUS
  Filled 2020-03-02: qty 200

## 2020-03-02 MED ORDER — DEXTROSE 5 % IV SOLN: INTRAVENOUS | Status: DC

## 2020-03-02 MED ORDER — SODIUM CHLORIDE 0.9% FLUSH
3.0000 mL | Freq: Two times a day (BID) | INTRAVENOUS | Status: DC
  Administered 2020-03-02 – 2020-03-14 (×17): 3 mL via INTRAVENOUS

## 2020-03-02 MED ORDER — SODIUM CHLORIDE 0.9% FLUSH: 3.0000 mL | INTRAVENOUS | Status: DC | PRN

## 2020-03-02 MED ORDER — SODIUM CHLORIDE 0.9 % IV SOLN: Freq: Once | INTRAVENOUS | Status: DC

## 2020-03-02 MED ORDER — INSULIN ASPART 100 UNIT/ML ~~LOC~~ SOLN
0.0000 [IU] | SUBCUTANEOUS | Status: DC
  Administered 2020-03-03: 21:00:00 5 [IU] via SUBCUTANEOUS
  Administered 2020-03-03: 3 [IU] via SUBCUTANEOUS
  Administered 2020-03-03: 2 [IU] via SUBCUTANEOUS
  Administered 2020-03-03 (×2): 5 [IU] via SUBCUTANEOUS
  Administered 2020-03-04: 2 [IU] via SUBCUTANEOUS
  Administered 2020-03-04: 3 [IU] via SUBCUTANEOUS
  Administered 2020-03-04: 2 [IU] via SUBCUTANEOUS
  Administered 2020-03-04 (×2): 3 [IU] via SUBCUTANEOUS
  Administered 2020-03-04: 5 [IU] via SUBCUTANEOUS
  Administered 2020-03-05: 2 [IU] via SUBCUTANEOUS
  Administered 2020-03-05: 3 [IU] via SUBCUTANEOUS
  Administered 2020-03-06 – 2020-03-09 (×11): 2 [IU] via SUBCUTANEOUS
  Filled 2020-03-02 (×24): qty 1

## 2020-03-02 MED ORDER — SODIUM CHLORIDE 0.9 % IV SOLN
1000.0000 mL | Freq: Once | INTRAVENOUS | Status: AC
  Administered 2020-03-02: 1000 mL via INTRAVENOUS

## 2020-03-02 MED ORDER — METRONIDAZOLE IN NACL 5-0.79 MG/ML-% IV SOLN
500.0000 mg | Freq: Once | INTRAVENOUS | Status: AC
  Administered 2020-03-02: 500 mg via INTRAVENOUS
  Filled 2020-03-02: qty 100

## 2020-03-02 MED ORDER — POLYETHYLENE GLYCOL 3350 17 G PO PACK: 17.0000 g | PACK | Freq: Every day | ORAL | Status: DC | PRN

## 2020-03-02 MED ORDER — DOCUSATE SODIUM 100 MG PO CAPS: 100.0000 mg | ORAL_CAPSULE | Freq: Two times a day (BID) | ORAL | Status: DC | PRN

## 2020-03-02 MED ORDER — FAMOTIDINE IN NACL 20-0.9 MG/50ML-% IV SOLN
20.0000 mg | Freq: Two times a day (BID) | INTRAVENOUS | Status: DC
  Administered 2020-03-02 – 2020-03-07 (×11): 20 mg via INTRAVENOUS
  Filled 2020-03-02 (×12): qty 50

## 2020-03-02 MED ORDER — ACETAMINOPHEN 325 MG PO TABS: 650.0000 mg | ORAL_TABLET | ORAL | Status: DC | PRN

## 2020-03-02 MED ORDER — TAMSULOSIN HCL 0.4 MG PO CAPS
0.4000 mg | ORAL_CAPSULE | Freq: Every day | ORAL | Status: DC
  Administered 2020-03-05 – 2020-03-16 (×9): 0.4 mg via ORAL
  Filled 2020-03-02 (×9): qty 1

## 2020-03-02 MED ORDER — ENOXAPARIN SODIUM 40 MG/0.4ML ~~LOC~~ SOLN
40.0000 mg | SUBCUTANEOUS | Status: DC
  Administered 2020-03-02 – 2020-03-08 (×7): 40 mg via SUBCUTANEOUS
  Filled 2020-03-02 (×7): qty 0.4

## 2020-03-02 NOTE — ED Notes (Signed)
This RN successfully contacted legal guardian and updated on pt status.

## 2020-03-02 NOTE — ED Notes (Signed)
Pt to CT

## 2020-03-02 NOTE — ED Notes (Signed)
Pt able to tolerate drinking oral apple juice at this time. Pt currently alert and responding appropriately via nodding gestures.

## 2020-03-02 NOTE — Progress Notes (Signed)
PHARMACY -  BRIEF ANTIBIOTIC NOTE   Pharmacy has received consult(s) for Vancomycin and Cefepime from an ED provider.  The patient's profile has been reviewed for ht/wt/allergies/indication/available labs.    One time order(s) placed for Vancomycin 1g IV and Cefepime 2g IV x 1 dose each.  Further antibiotics/pharmacy consults should be ordered by admitting physician if indicated.                       Thank you, Bettey Costa 03/02/2020  3:31 PM

## 2020-03-02 NOTE — ED Notes (Signed)
This RN attempted to contact pt's legal guardian, Joanne [name], with no answer at this time.

## 2020-03-02 NOTE — ED Provider Notes (Signed)
[facility] Emergency Department Provider Note    First MD Initiated Contact with Patient 03/02/20 1458     (approximate)  I have reviewed the triage vital signs and the nursing notes.   HISTORY  Chief Complaint Altered Mental Status  Level V Caveat:  AMS - chronically nonverbal  HPI Joanne [name] is a 49 y.o. female extensive and complex past medical history status post TBI tracheostomy status presents from nursing facility due to altered mental status weakness and not eating for the past several days.  Patient unable to provide any additional history.  She arrives tachycardic hypotensive tachypneic with elevated core temperature to 104.    Past Medical History:  Diagnosis Date  . Acute kidney injury (AKI) with acute tubular necrosis (ATN) (HCC)   . Acute on chronic combined systolic and diastolic CHF (congestive heart failure) ([place]) 06/06/2018  . Acute on chronic respiratory failure with hypoxia ([place]) 06/06/2018  . ARDS (adult respiratory distress syndrome) ([place]) 06/06/2018  . Aspiration pneumonia due to gastric secretions ([place])   . Diabetes mellitus ([place])   . History of traumatic brain injury   . Hypothyroidism   . Pneumonia due to Klebsiella pneumoniae ([place]) 06/06/2018  . Seizure disorder ([place])   . Severe sepsis ([place]) 06/06/2018  . Tracheostomy status ([place]) 06/06/2018  . Traumatic brain injury [facility])    Family History  Family history unknown: Yes   Past Surgical History:  Procedure Laterality Date  . TRACHEOSTOMY     Patient Active Problem List   Diagnosis Date Noted  . Hypernatremia 11/15/2019  . Thrombocytopenia ([place]) 11/15/2019  . AKI (acute kidney injury) ([place]) 11/15/2019  . HTN (hypertension) 11/15/2019  . Non-insulin dependent type 2 diabetes mellitus ([place]) 11/15/2019  . Hypothyroidism 11/15/2019  . Sepsis ([place]) 11/15/2019  . Left hemiplegia ([place]) 11/15/2019  . Pressure injury of skin 11/15/2019  . Aspiration pneumonia due to  gastric secretions ([place])   . Acute kidney injury (AKI) with acute tubular necrosis (ATN) (HCC)   . Traumatic brain injury ([place])   . Acute on chronic respiratory failure with hypoxia ([place]) 06/06/2018  . Tracheostomy status ([place]) 06/06/2018  . Pneumonia due to Klebsiella pneumoniae ([place]) 06/06/2018  . Acute on chronic combined systolic and diastolic CHF (congestive heart failure) ([place]) 06/06/2018  . Sepsis secondary to UTI ([place]) 06/06/2018  . ARDS (adult respiratory distress syndrome) ([place]) 06/06/2018  . Acute on chronic respiratory failure with hypoxia ([place]) 06/06/2018  . Tracheostomy status ([place]) 06/06/2018      Prior to Admission medications   Medication Sig Start Date End Date Taking? Authorizing Provider  acetaminophen (TYLENOL) 325 MG tablet Take 2 tablets (650 mg total) by mouth every 6 (six) hours as needed for mild pain or fever (or Fever >/= 101). 11/20/19   Wyvonnia Dusky, MD  carbamazepine (TEGRETOL XR) 100 MG 12 hr tablet Take 100 mg by mouth 2 (two) times daily.    [provider]  carvedilol (COREG) 12.5 MG tablet Take 12.5 mg by mouth 2 (two) times daily. 09/10/19   [provider]  famotidine (PEPCID) 10 MG tablet Take 20 mg by mouth 2 (two) times daily.    [provider]  ferrous sulfate 325 (65 FE) MG tablet Take 325 mg by mouth daily.    [provider]  folic acid (FOLVITE) 1 MG tablet Take 1 mg by mouth daily. 07/28/19   [provider]  levothyroxine (SYNTHROID) 100 MCG tablet Take 100 mcg by mouth daily.  07/18/19   [provider]  loratadine (CLARITIN) 10 MG tablet Take 20 mg by mouth daily.    [provider]  MULTIPLE VITAMIN PO Take 1 tablet by mouth daily.    [provider]  polyethylene glycol powder (GLYCOLAX/MIRALAX) 17 GM/SCOOP powder Take 17 g by mouth daily.    [provider]  Polyvinyl Alcohol-Povidone PF 1.4-0.6 % SOLN Place 1 drop into both eyes 3 (three) times daily.     [provider]  tamsulosin (FLOMAX) 0.4 MG CAPS capsule Take 0.4 mg by mouth daily. 06/16/19   [provider]  traMADol (ULTRAM) 50 MG tablet Take 25 mg by mouth 3 (three) times daily. 08/05/19   [provider]    Allergies Kaopectate  [attapulgite], Thiopental, and Tomato    Social History Social History   Tobacco Use  . Smoking status: Never Smoker  . Smokeless tobacco: Never Used  Substance Use Topics  . Alcohol use: Not Currently  . Drug use: Not Currently    Review of Systems Patient denies headaches, rhinorrhea, blurry vision, numbness, shortness of breath, chest pain, edema, cough, abdominal pain, nausea, vomiting, diarrhea, dysuria, fevers, rashes or hallucinations unless otherwise stated above in HPI. ____________________________________________   PHYSICAL EXAM:  VITAL SIGNS: Vitals:   03/02/20 1600 03/02/20 1649  BP: (!) 84/67 (!) 86/58  Pulse: (!) 112   Resp: 18   Temp:    SpO2: 100%     Constitutional: Alert, chronically nonverbal,  Chronically ill appearing Eyes: Conjunctivae are normal.  Head: Atraumatic. Nose: No congestion/rhinnorhea. Mouth/Throat: Mucous membranes are moist.   Neck: No stridor. Painless ROM.  Cardiovascular: tachycardic, regular rhythm. Grossly normal heart sounds.  Good peripheral circulation. Respiratory: mild tachypnea.  No retractions. Lungs with coarse bibasilar breathsounds Gastrointestinal: Soft, no guarding or rebound. No distention. No abdominal bruits. No CVA tenderness. Genitourinary: normal external genitalia Musculoskeletal: No lower extremity tenderness nor edema.  No joint effusions. Malodorous decubitus ulcer Neurologic:   No new gross focal neurologic deficits are appreciated. Ulceration to base of left heel, no surrounding cellulitic changes Skin:  Skin is warm, dry and intact. No rash noted. Psychiatric: unable to assess  ____________________________________________   LABS (all  labs ordered are listed, but only abnormal results are displayed)  Results for orders placed or performed during the hospital encounter of 03/02/20 (from the past 24 hour(s))  Lactic acid, plasma     Status: Abnormal   Collection Time: 03/02/20  2:30 PM  Result Value Ref Range   Lactic Acid, Venous 2.3 (HH) 0.5 - 1.9 mmol/L  Comprehensive metabolic panel     Status: Abnormal   Collection Time: 03/02/20  2:31 PM  Result Value Ref Range   Sodium 157 (H) 135 - 145 mmol/L   Potassium 3.8 3.5 - 5.1 mmol/L   Chloride 118 (H) 98 - 111 mmol/L   CO2 26 22 - 32 mmol/L   Glucose, Bld 213 (H) 70 - 99 mg/dL   BUN 79 (H) 6 - 20 mg/dL   Creatinine, Ser 9.24 (H) 0.44 - 1.00 mg/dL   Calcium 9.4 8.9 - 26.8 mg/dL   Total Protein 9.3 (H) 6.5 - 8.1 g/dL   Albumin 2.3 (L) 3.5 - 5.0 g/dL   AST 20 15 - 41 U/L   ALT 13 0 - 44 U/L   Alkaline Phosphatase 120 38 - 126 U/L   Total Bilirubin 0.9 0.3 - 1.2 mg/dL   GFR calc non Af Amer 31 (L) >60 mL/min  GFR calc Af Amer 36 (L) >60 mL/min   Anion gap 13 5 - 15  CBC WITH DIFFERENTIAL     Status: Abnormal   Collection Time: 03/02/20  2:31 PM  Result Value Ref Range   WBC 30.0 (H) 4.0 - 10.5 K/uL   RBC 5.09 3.87 - 5.11 MIL/uL   Hemoglobin 13.9 12.0 - 15.0 g/dL   HCT 84.1 66.0 - 63.0 %   MCV 87.2 80.0 - 100.0 fL   MCH 27.3 26.0 - 34.0 pg   MCHC 31.3 30.0 - 36.0 g/dL   RDW 16.0 10.9 - 32.3 %   Platelets 221 150 - 400 K/uL   nRBC 0.1 0.0 - 0.2 %   Neutrophils Relative % 91 %   Neutro Abs 27.5 (H) 1.7 - 7.7 K/uL   Lymphocytes Relative 3 %   Lymphs Abs 0.8 0.7 - 4.0 K/uL   Monocytes Relative 3 %   Monocytes Absolute 0.9 0.1 - 1.0 K/uL   Eosinophils Relative 0 %   Eosinophils Absolute 0.0 0.0 - 0.5 K/uL   Basophils Relative 0 %   Basophils Absolute 0.0 0.0 - 0.1 K/uL   WBC Morphology VACUOLATED NEUTROPHILS    RBC Morphology MORPHOLOGY UNREMARKABLE    Smear Review Normal platelet morphology    Immature Granulocytes 3 %   Abs Immature Granulocytes 0.88  (H) 0.00 - 0.07 K/uL  Urinalysis, Routine w reflex microscopic     Status: Abnormal   Collection Time: 03/02/20  2:31 PM  Result Value Ref Range   Color, Urine AMBER (A) YELLOW   APPearance CLOUDY (A) CLEAR   Specific Gravity, Urine 1.014 1.005 - 1.030   pH 6.0 5.0 - 8.0   Glucose, UA NEGATIVE NEGATIVE mg/dL   Hgb urine dipstick MODERATE (A) NEGATIVE   Bilirubin Urine NEGATIVE NEGATIVE   Ketones, ur NEGATIVE NEGATIVE mg/dL   Protein, ur 557 (A) NEGATIVE mg/dL   Nitrite NEGATIVE NEGATIVE   Leukocytes,Ua MODERATE (A) NEGATIVE   RBC / HPF 0-5 0 - 5 RBC/hpf   WBC, UA 11-20 0 - 5 WBC/hpf   Bacteria, UA MANY (A) NONE SEEN   Squamous Epithelial / LPF 0-5 0 - 5  Procalcitonin     Status: None   Collection Time: 03/02/20  2:31 PM  Result Value Ref Range   Procalcitonin 7.11 ng/mL  Brain natriuretic peptide     Status: Abnormal   Collection Time: 03/02/20  2:31 PM  Result Value Ref Range   B Natriuretic Peptide 186.0 (H) 0.0 - 100.0 pg/mL  SARS Coronavirus 2 by RT PCR (hospital order, performed in [facility] hospital lab) Nasopharyngeal Nasopharyngeal Swab     Status: None   Collection Time: 03/02/20  2:31 PM   Specimen: Nasopharyngeal Swab  Result Value Ref Range   SARS Coronavirus 2 NEGATIVE NEGATIVE   ____________________________________________  EKG My review and personal interpretation at Time: 14:36   Indication: sepsis  Rate: 130  Rhythm: sinus Axis: normal Other: prolonged qt, sinus tach, nonspecific st abn ____________________________________________  RADIOLOGY  I personally reviewed all radiographic images ordered to evaluate for the above acute complaints and reviewed radiology reports and findings.  These findings were personally discussed with the patient.  Please see medical record for radiology report.  ____________________________________________   PROCEDURES  Due to difficulty with obtaining IV access, a 20G peripheral IV catheter was inserted using US  guidance into the LUE.  The site was prepped with chlorhexidine and allowed to dry.  The patient tolerated  the procedure without any complications.   Procedure(s) performed:  .Critical Care Performed by: Willy Eddy, MD Authorized by: Willy Eddy, MD   Critical care provider statement:    Critical care time (minutes):  40   Critical care time was exclusive of:  Separately billable procedures and treating other patients   Critical care was necessary to treat or prevent imminent or life-threatening deterioration of the following conditions:  Sepsis   Critical care was time spent personally by me on the following activities:  Development of treatment plan with patient or surrogate, discussions with consultants, evaluation of patient's response to treatment, examination of patient, obtaining history from patient or surrogate, ordering and performing treatments and interventions, ordering and review of laboratory studies, ordering and review of radiographic studies, pulse oximetry, re-evaluation of patient's condition and review of old charts      Critical Care performed: yes ____________________________________________   INITIAL IMPRESSION / ASSESSMENT AND PLAN / ED COURSE  Pertinent labs & imaging results that were available during my care of the patient were reviewed by me and considered in my medical decision making (see chart for details).   DDX: Dehydration, sepsis, pna, uti, hypoglycemia, cva, drug effect, withdrawal, encephalitis   Joanne [name] is a 49 y.o. who presents to the ED with presentation as described above.  Patient is very ill-appearing hypotensive tachycardic meeting criteria for sepsis with her fever.  Multiple possible sources.  Does have decubitus ulcer is likely intermittently contaminated.  Will order CT imaging.  Chest x-ray ordered no clear pneumonia.  She is not hypoxic possible UTI.  Clinical Course as of Mar 02 1714  Tue Mar 02, 2020  1552  Broad-spectrum antibiotics are infusing.  Blood pressure is improving with IV fluid resuscitation.  Noted hyponatremia as well as significant leukocytosis and elevated procalcitonin.  Chest x-ray without infiltrate.  Given her UTI and significant sepsis will order CT imaging to evaluate for intra-abdominal process or obstructive uropathy.   [PR]  1705 Patient very poor peripheral access.  Was able to obtain another ultrasound guided IV 20-gauge to the left upper extremity.  I will start vasopressors as she does have persistent hypotension   [PR]    Clinical Course User Index [PR] Willy Eddy, MD    The patient was evaluated in Emergency Department today for the symptoms described in the history of present illness. He/she was evaluated in the context of the global COVID-19 pandemic, which necessitated consideration that the patient might be at risk for infection with the SARS-CoV-2 virus that causes COVID-19. Institutional protocols and algorithms that pertain to the evaluation of patients at risk for COVID-19 are in a state of rapid change based on information released by regulatory bodies including the CDC and federal and state organizations. These policies and algorithms were followed during the patient's care in the ED.  As part of my medical decision making, I reviewed the following data within the electronic MEDICAL RECORD NUMBER Nursing notes reviewed and incorporated, Labs reviewed, notes from prior ED visits and [place] Controlled Substance Database   ____________________________________________   FINAL CLINICAL IMPRESSION(S) / ED DIAGNOSES  Final diagnoses:  Severe sepsis with acute organ dysfunction (HCC)  Hypernatremia      NEW MEDICATIONS STARTED DURING THIS VISIT:  New Prescriptions   No medications on file     Note:  This document was prepared using Dragon voice recognition software and may include unintentional dictation errors.    Willy Eddy, MD 03/02/20  1715

## 2020-03-02 NOTE — ED Notes (Signed)
This RN attempted to call report at this time. Charge RN said they could accept patient at 8pm

## 2020-03-02 NOTE — H&P (Signed)
Name: Joanne [name] MRN: [id number] DOB: 23-Sep-1971    ADMISSION DATE:  03/02/2020 CONSULTATION DATE:  03/02/2020  REFERRING MD :  Dr. Quentin Cornwall  CHIEF COMPLAINT:  Altered Mental Status  BRIEF PATIENT DESCRIPTION:  49 y.o. female with a PMH significant for TBI requiring chronic Trach admitted 03/02/20 with Septic shock secondary UTI & questionable Pneumonia.  Pt also noted to have chronic sacral decubitus ulcer, left heel ulcer, and right posterior leg wound.  Wound care consulted.   SIGNIFICANT EVENTS  5/11: Admission to ICU  STUDIES:  5/11: CXR>> Tracheostomy is unchanged. Heart and mediastinal contours are within normal limits. No focal opacities or effusions. No acute bony abnormality. Severe thoracolumbar scoliosis. 5/11: CT Abdomen/Pelvis>>Patchy ground-glass airspace opacities posteriorly in the left lower lobe could reflect early pneumonia. Fatty infiltration of the liver. Large stool burden in the rectosigmoid colon which may reflect early fecal impaction. Multiple large ventral hernias containing large and small bowel loops. No evidence of bowel obstruction.  CULTURES: SARS-CoV-2 PCR 5/11>> negative Urine 5/11>> Blood culture x2 5/11>> Strep pneumo urinary antigen 5/11>> Legionella urinary antigen 5/11>>  ANTIBIOTICS: Cefepime x1 dose in ED 5/11 Flagyl x1 dose in ED 5/11 Vancomycin 5/11>> Zosyn 5/11>>  HISTORY OF PRESENT ILLNESS:   Joanne [name] is a 49 year old female with a past medical history significant for traumatic brain injury requiring chronic tracheostomy who presents from her nursing facility on 03/02/2020 due to altered mental status and weakness.  IT is also reported she is not been eating for the past several days.  Patient is currently unable to provide any history, therefore history is obtained from ED and nursing notes.  Upon presentation to the ED she was noted to be tachycardic, hypotensive, and febrile with temperature 104.  She met sepsis  criteria therefore she was given IV fluids and broad-spectrum antibiotics.  Initial work-up in the ED reveals sodium 157, BUN 79, creatinine 1.85, glucose 213, WBC 30, procalcitonin 7.11, lactic acid 2.3, BNP 186.  Her COVID-19 PCR is negative.  Urinalysis is positive for urinary tract infection.  Chest x-ray without any acute disease.  CT abdomen pelvis is concerning for patchy groundglass airspace opacities in the left lower lobe indicating possible early pneumonia, fatty liver, large stool burden in the rectosigmoid colon, and multiple large ventral hernias containing large and small bowel loops, but no evidence of obstruction.  Despite IV fluid resuscitation she remained hypotensive requiring vasopressors.  PCCM is asked to admit the patient to ICU for further work-up and treatment of septic shock secondary to UTI and questionable developing pneumonia, along with acute kidney injury and hypernatremia.  Of note, she does have chronic sacral decubitus ulcer with tunneling, ulcer to Left heel, and wound to right posterior leg inferior to the gluteus muscle, but they do not appear grossly infected at this time.  PAST MEDICAL HISTORY :   has a past medical history of Acute kidney injury (AKI) with acute tubular necrosis (ATN) (HCC), Acute on chronic combined systolic and diastolic CHF (congestive heart failure) ([place]) (06/06/2018), Acute on chronic respiratory failure with hypoxia ([place]) (06/06/2018), ARDS (adult respiratory distress syndrome) ([place]) (06/06/2018), Aspiration pneumonia due to gastric secretions ([place]), Diabetes mellitus ([place]), History of traumatic brain injury, Hypothyroidism, Pneumonia due to Klebsiella pneumoniae ([place]) (06/06/2018), Seizure disorder ([place]), Severe sepsis ([place]) (06/06/2018), Tracheostomy status ([place]) (06/06/2018), and Traumatic brain injury ([place]).  has a past surgical history that includes Tracheostomy. Prior to Admission medications   Medication Sig Start Date End Date Taking?  Authorizing  Provider  acetaminophen (TYLENOL) 325 MG tablet Take 2 tablets (650 mg total) by mouth every 6 (six) hours as needed for mild pain or fever (or Fever >/= 101). 11/20/19   Wyvonnia Dusky, MD  aspirin 81 MG EC tablet aspirin 81 mg tablet,delayed release  Take 1 tablet every day by oral route.    [provider]  B Complex-C-E-Zn (B COMPLEX-C-E-ZINC) tablet Take by mouth.    [provider]  carbamazepine (TEGRETOL XR) 100 MG 12 hr tablet Take 100 mg by mouth 2 (two) times daily.    [provider]  carvedilol (COREG) 12.5 MG tablet Take 12.5 mg by mouth 2 (two) times daily. 09/10/19   [provider]  cetirizine (ZYRTEC) 10 MG tablet Take by mouth.    [provider]  cyanocobalamin 50 MCG tablet Take by mouth.    [provider]  famotidine (PEPCID) 10 MG tablet Take 20 mg by mouth 2 (two) times daily.    [provider]  famotidine (PEPCID) 20 MG tablet Take 20 mg by mouth 2 (two) times daily. 02/10/20   [provider]  ferrous sulfate 325 (65 FE) MG tablet Take 325 mg by mouth daily.    [provider]  folic acid (FOLVITE) 1 MG tablet Take 1 mg by mouth daily. 07/28/19   [provider]  levothyroxine (SYNTHROID) 100 MCG tablet Take 100 mcg by mouth daily. 07/18/19   [provider]  loratadine (CLARITIN) 10 MG tablet Take 20 mg by mouth daily.    [provider]  metoprolol tartrate (LOPRESSOR) 25 MG tablet Take by mouth.    [provider]  MULTIPLE VITAMIN PO Take 1 tablet by mouth daily.    [provider]  polyethylene glycol powder (GLYCOLAX/MIRALAX) 17 GM/SCOOP powder Take 17 g by mouth daily.    [provider]  Polyvinyl Alcohol-Povidone PF 1.4-0.6 % SOLN Place 1 drop into both eyes 3 (three) times daily.    [provider]  tamsulosin (FLOMAX) 0.4 MG CAPS capsule Take 0.4 mg by mouth daily. 06/16/19   [provider]    traMADol (ULTRAM) 50 MG tablet Take 25 mg by mouth 3 (three) times daily. 08/05/19   [provider]   Allergies  Allergen Reactions  . Kaopectate  [Attapulgite] Itching  . Thiopental Itching  . Tomato     FAMILY HISTORY:  Family history is unknown by patient. SOCIAL HISTORY:  reports that she has never smoked. She has never used smokeless tobacco. She reports previous alcohol use. She reports previous drug use.   COVID-19 DISASTER DECLARATION:  FULL CONTACT PHYSICAL EXAMINATION WAS NOT POSSIBLE DUE TO TREATMENT OF COVID-19 AND  CONSERVATION OF PERSONAL PROTECTIVE EQUIPMENT, LIMITED EXAM FINDINGS INCLUDE-  Patient assessed or the symptoms described in the history of present illness.  In the context of the Global COVID-19 pandemic, which necessitated consideration that the patient might be at risk for infection with the SARS-CoV-2 virus that causes COVID-19, Institutional protocols and algorithms that pertain to the evaluation of patients at risk for COVID-19 are in a state of rapid change based on information released by regulatory bodies including the CDC and federal and state organizations. These policies and algorithms were followed during the patient's care while in hospital.  REVIEW OF SYSTEMS:  Positives in BOLD Constitutional: Negative for fever, chills, weight loss, malaise/fatigue and diaphoresis.  HENT: Negative for hearing loss, ear pain, nosebleeds, congestion, sore throat, neck pain, tinnitus and ear discharge.  Eyes: Negative for blurred vision, double vision, photophobia, pain, discharge and redness.  Respiratory: Negative for cough, hemoptysis, sputum production, shortness of breath, wheezing and stridor.   Cardiovascular: Negative for chest pain, palpitations, orthopnea, claudication, leg swelling and PND.  Gastrointestinal: Negative for heartburn, nausea, vomiting, abdominal pain, diarrhea, constipation, blood in stool and melena.  Genitourinary: Negative  for dysuria, urgency, frequency, hematuria and flank pain.  Musculoskeletal: Negative for myalgias, + back pain, joint pain and falls.  Skin: Negative for itching and rash +pressure ulcer  Neurological: Negative for dizziness, tingling, tremors, sensory change, speech change, focal weakness, seizures, loss of consciousness, weakness and headaches.  Endo/Heme/Allergies: Negative for environmental allergies and polydipsia. Does not bruise/bleed easily.  SUBJECTIVE:  Pt acknowledges back pain Denies chest pain, SOB, cough, abdominal pain, N/V/D Chronic trach with pessimuir valve in, requiring no supplemental O2  VITAL SIGNS: Temp:  [97.6 F (36.4 C)-104.1 F (40.1 C)] 97.6 F (36.4 C) (05/11 1800) Pulse Rate:  [91-128] 94 (05/11 1904) Resp:  [11-46] 24 (05/11 2000) BP: (72-96)/(52-75) 82/61 (05/11 2000) SpO2:  [97 %-100 %] 98 % (05/11 1904) Weight:  [79.4 kg] 79.4 kg (05/11 1437)  PHYSICAL EXAMINATION: General:  Acute on chronically ill appearing female, cachectic,laying in bed, on room air, in NAD Neuro:  Awake, nods to questions, nonverbal (baseline), follows commands, no focal deficits HEENT:  Atraumatic, normocephalic, neck supple, no JVD Cardiovascular:  Regular rate & rhythm, s1s2, no M/R/G Lungs:  Clear diminised to auscultation bilaterally, even, nonlabored, normal effort Abdomen:  Soft, nontender, nondistended, no guarding or rebound tenderness, multiple ventral hernias noted Musculoskeletal:  Generalized weakness, no edema Skin:  Cool and dry.  Unstagable Sacral decubitus ulcer w/ tunneling, wound to right posterior leg just inferior to gluteus muscle, left heel decubitus ulcer           Recent Labs  Lab 03/02/20 1431  NA 157*  K 3.8  CL 118*  CO2 26  BUN 79*  CREATININE 1.85*  GLUCOSE 213*   Recent Labs  Lab 03/02/20 1431  HGB 13.9  HCT 44.4  WBC 30.0*  PLT 221   CT ABDOMEN PELVIS W CONTRAST  Result Date: 03/02/2020 CLINICAL DATA:  Sepsis EXAM: CT  ABDOMEN AND PELVIS WITH CONTRAST TECHNIQUE: Multidetector CT imaging of the abdomen and pelvis was performed using the standard protocol following bolus administration of intravenous contrast. CONTRAST:  96m OMNIPAQUE IOHEXOL 300 MG/ML  SOLN COMPARISON:  None FINDINGS: Lower chest: Airspace opacities noted posteriorly in the left lower lobe could reflect early pneumonia. No effusions. Heart is normal size. Hepatobiliary: Prior cholecystectomy. Diffuse fatty infiltration of the liver. No focal hepatic abnormality. Pancreas: No focal abnormality or ductal dilatation. Spleen: Calcifications throughout the spleen compatible with old granulomatous disease. Adrenals/Urinary Tract: No adrenal abnormality. No focal renal abnormality. No stones or hydronephrosis. Urinary bladder is unremarkable. Stomach/Bowel: Large stool burden in the rectosigmoid colon. Possible early fecal impaction. Multiple large ventral hernias contain bowel. No evidence of bowel obstruction. Vascular/Lymphatic: No evidence of aneurysm or adenopathy. Reproductive: Uterus and adnexa unremarkable.  No mass. Other: No free fluid or free air. Musculoskeletal: Scoliosis. Degenerative changes throughout the thoracic and lumbar spine. Chronic appearing compression fracture in the lower thoracic spine. No acute bony abnormality. IMPRESSION: Patchy ground-glass airspace opacities posteriorly in the left lower lobe could reflect early pneumonia. Fatty infiltration of the liver. Large stool burden in the rectosigmoid colon which may reflect early fecal impaction. Multiple large ventral hernias containing large and small bowel loops. No evidence  of bowel obstruction. Electronically Signed   By: Rolm Baptise M.D.   On: 03/02/2020 16:57   DG Chest Port 1 View  Result Date: 03/02/2020 CLINICAL DATA:  Altered mental status EXAM: PORTABLE CHEST 1 VIEW COMPARISON:  11/14/2019 FINDINGS: Tracheostomy is unchanged. Heart and mediastinal contours are within normal  limits. No focal opacities or effusions. No acute bony abnormality. Severe thoracolumbar scoliosis. IMPRESSION: No active disease. Electronically Signed   By: Rolm Baptise M.D.   On: 03/02/2020 15:41    ASSESSMENT / PLAN:  Septic shock due to UTI & ? Pneumonia Hx: Chronic Combined systolic & diastolic CHF -Continuous cardiac monitoring -Maintain MAP greater than 65 -Received IV fluid resuscitation in ED -Vasopressors as needed to maintain MAP goal -Stress dose sterioids -Trend lactic acid -Consider Echocardiogram  Severe Sepsis secondary to UTI & ? Pneumonia Sacral & left Heel decubitus ulcers, wound to right posterior leg inferior to gluteus muscle>> doesn't appear grossly infected Hx: Aspiration PNA -Monitor fever curve -Trend WBCs and procalcitonin -Follow cultures as above -Place on empiric vancomycin and Zosyn for now -Wound consult, appreciate input -Consider General Surgery consult  AKI Hypernatremia -Monitor I&O's / urinary output -Follow BMP -Ensure adequate renal perfusion -Avoid nephrotoxic agents as able -Replace electrolytes as indicated -IV fluids -D5W @ 50 ml/hr (Goal is to decrease Na by approx.10 mEq in 24 hrs, but no more than 12 mEq in 24 hrs) -Follow Na q4h  Diabetes Mellitus -CBGs -Sliding scale insulin -Follow ICU hypo/hyperglycemia protocol     BEST PRACTICES: DISPOSITION: ICU GOALS OF CARE: Full code VTE PROPHYLAXIS: SQ Lovenox CONSULTS: Wound  UPDATES: Updated pt at bedside 03/02/20  Darel Hong, AGACNP-BC [facility] Pager: [phone]  03/02/2020, 9:44 PM

## 2020-03-02 NOTE — ED Notes (Signed)
This RN attempted to call report at this time. ICU currently accepting more critical patient from ED. This RN will call report after shift change.

## 2020-03-02 NOTE — ED Triage Notes (Signed)
Pt presents from [facility] via [facility] with c/o unresponsiveness. Last known normal 10 am. pt has been refusing to eat for 2 weeks. Pt temp 101 for ems. 120 hr for ems. Blood sugar within normal limits. Baseline mental status pt can eat with assistance, unsure on verbal status. Pt full code per ems. Pt currently non-verbal and lethargic at this time. Pt has chronic trach.

## 2020-03-02 NOTE — Progress Notes (Signed)
Pt with minimal PIV access with vasopressors infusing.  Lab is having difficulty in drawing lab work.  Pt needs central venous access.  Discussed with pt need for central line given lack of IV access and frequent lab draws.  She gives permission to place line.  Pt is severely contracted, and unable to tolerate positioning to place either Left IJ or right IJ.  Will attempt femoral line placement tonight, and consult IV team for PICC placement tomorrow.   23:30 Have attempted placement of Left Femoral CVC.  Able to cannulate femoral vein,but unable to thread guidewire through (changed angle approach multiple times with no success).  Attempt aborted, pressure held at site. No signs of complications.    Harlon Ditty, AGACNP-BC [facility] Pager: [phone]

## 2020-03-03 ENCOUNTER — Encounter: Payer: Self-pay | Admitting: Internal Medicine

## 2020-03-03 ENCOUNTER — Inpatient Hospital Stay: Payer: Medicare PPO

## 2020-03-03 DIAGNOSIS — B955 Unspecified streptococcus as the cause of diseases classified elsewhere: Secondary | ICD-10-CM

## 2020-03-03 DIAGNOSIS — Z7189 Other specified counseling: Secondary | ICD-10-CM

## 2020-03-03 DIAGNOSIS — R7881 Bacteremia: Secondary | ICD-10-CM

## 2020-03-03 DIAGNOSIS — Z515 Encounter for palliative care: Secondary | ICD-10-CM

## 2020-03-03 LAB — BLOOD CULTURE ID PANEL (REFLEXED)

## 2020-03-03 LAB — BASIC METABOLIC PANEL
Anion gap: 4 — ABNORMAL LOW (ref 5–15)
BUN: 56 mg/dL — ABNORMAL HIGH (ref 6–20)
CO2: 27 mmol/L (ref 22–32)
Calcium: 8.1 mg/dL — ABNORMAL LOW (ref 8.9–10.3)
Chloride: 119 mmol/L — ABNORMAL HIGH (ref 98–111)
Creatinine, Ser: 1.06 mg/dL — ABNORMAL HIGH (ref 0.44–1.00)
GFR calc Af Amer: >60 mL/min (ref >60)
GFR calc non Af Amer: >60 mL/min (ref >60)
Glucose, Bld: 210 mg/dL — ABNORMAL HIGH (ref 70–99)
Potassium: 2.9 mmol/L — ABNORMAL LOW (ref 3.5–5.1)
Sodium: 150 mmol/L — ABNORMAL HIGH (ref 135–145)

## 2020-03-03 LAB — CBC
HCT: 33.4 % — ABNORMAL LOW (ref 36.0–46.0)
Hemoglobin: 10.1 g/dL — ABNORMAL LOW (ref 12.0–15.0)
MCH: 27.4 pg (ref 26.0–34.0)
MCHC: 30.2 g/dL (ref 30.0–36.0)
MCV: 90.8 fL (ref 80.0–100.0)
Platelets: 162 10*3/uL (ref 150–400)
RBC: 3.68 MIL/uL — ABNORMAL LOW (ref 3.87–5.11)
RDW: 14.6 % (ref 11.5–15.5)
WBC: 25.9 10*3/uL — ABNORMAL HIGH (ref 4.0–10.5)
nRBC: 0.1 % (ref 0.0–0.2)

## 2020-03-03 LAB — BLOOD GAS, ARTERIAL
Acid-Base Excess: 1.7 mmol/L (ref 0.0–2.0)
Bicarbonate: 26.6 mmol/L (ref 20.0–28.0)
FIO2: 0.21
O2 Saturation: 92.9 %
Patient temperature: 37
pCO2 arterial: 42 mmHg (ref 32.0–48.0)
pH, Arterial: 7.41 (ref 7.350–7.450)
pO2, Arterial: 66 mmHg — ABNORMAL LOW (ref 83.0–108.0)

## 2020-03-03 LAB — SODIUM
Sodium: 149 mmol/L — ABNORMAL HIGH (ref 135–145)
Sodium: 142 mmol/L (ref 135–145)
Sodium: 143 mmol/L (ref 135–145)

## 2020-03-03 LAB — HEMOGLOBIN A1C
Hgb A1c MFr Bld: 6.1 % — ABNORMAL HIGH (ref 4.8–5.6)
Mean Plasma Glucose: 128.37 mg/dL

## 2020-03-03 LAB — GLUCOSE, CAPILLARY
Glucose-Capillary: 149 mg/dL — ABNORMAL HIGH (ref 70–99)
Glucose-Capillary: 158 mg/dL — ABNORMAL HIGH (ref 70–99)
Glucose-Capillary: 201 mg/dL — ABNORMAL HIGH (ref 70–99)
Glucose-Capillary: 211 mg/dL — ABNORMAL HIGH (ref 70–99)
Glucose-Capillary: 219 mg/dL — ABNORMAL HIGH (ref 70–99)
Glucose-Capillary: 238 mg/dL — ABNORMAL HIGH (ref 70–99)
Glucose-Capillary: 241 mg/dL — ABNORMAL HIGH (ref 70–99)
Glucose-Capillary: 95 mg/dL (ref 70–99)

## 2020-03-03 LAB — PROCALCITONIN: Procalcitonin: 5.15 ng/mL

## 2020-03-03 LAB — PHOSPHORUS: Phosphorus: 2 mg/dL — ABNORMAL LOW (ref 2.5–4.6)

## 2020-03-03 LAB — MAGNESIUM: Magnesium: 1.7 mg/dL (ref 1.7–2.4)

## 2020-03-03 LAB — STREP PNEUMONIAE URINARY ANTIGEN: Strep Pneumo Urinary Antigen: NEGATIVE

## 2020-03-03 LAB — LACTIC ACID, PLASMA: Lactic Acid, Venous: 2.2 mmol/L (ref 0.5–1.9)

## 2020-03-03 MED ORDER — POTASSIUM PHOSPHATES 15 MMOLE/5ML IV SOLN
10.0000 mmol | Freq: Once | INTRAVENOUS | Status: AC
  Administered 2020-03-03: 10 mmol via INTRAVENOUS
  Filled 2020-03-03: qty 3.33

## 2020-03-03 MED ORDER — VANCOMYCIN HCL IN DEXTROSE 1-5 GM/200ML-% IV SOLN
1000.0000 mg | INTRAVENOUS | Status: DC
  Filled 2020-03-03: qty 200

## 2020-03-03 MED ORDER — SODIUM CHLORIDE 0.45 % IV SOLN: INTRAVENOUS | Status: DC

## 2020-03-03 MED ORDER — CHLORHEXIDINE GLUCONATE CLOTH 2 % EX PADS
6.0000 | MEDICATED_PAD | Freq: Every day | CUTANEOUS | Status: DC
  Administered 2020-03-03 – 2020-03-16 (×12): 6 via TOPICAL

## 2020-03-03 MED ORDER — POTASSIUM CHLORIDE 10 MEQ/50ML IV SOLN
10.0000 meq | INTRAVENOUS | Status: AC
  Administered 2020-03-03 (×5): 10 meq via INTRAVENOUS
  Filled 2020-03-03 (×5): qty 50

## 2020-03-03 MED ORDER — CHLORHEXIDINE GLUCONATE 0.12 % MT SOLN
15.0000 mL | Freq: Two times a day (BID) | OROMUCOSAL | Status: DC
  Administered 2020-03-03 – 2020-03-16 (×25): 15 mL via OROMUCOSAL
  Filled 2020-03-03 (×21): qty 15

## 2020-03-03 MED ORDER — PENICILLIN G POTASSIUM 20000000 UNITS IJ SOLR
4.0000 10*6.[IU] | INTRAVENOUS | Status: DC
  Administered 2020-03-03 – 2020-03-04 (×4): 4 10*6.[IU] via INTRAVENOUS
  Filled 2020-03-03 (×9): qty 4

## 2020-03-03 MED ORDER — ACETAMINOPHEN 650 MG RE SUPP: 650.0000 mg | Freq: Four times a day (QID) | RECTAL | Status: DC | PRN

## 2020-03-03 MED ORDER — ORAL CARE MOUTH RINSE
15.0000 mL | Freq: Two times a day (BID) | OROMUCOSAL | Status: DC
  Administered 2020-03-03 – 2020-03-16 (×21): 15 mL via OROMUCOSAL

## 2020-03-03 MED ORDER — CEFAZOLIN SODIUM-DEXTROSE 1-4 GM/50ML-% IV SOLN
1.0000 g | Freq: Three times a day (TID) | INTRAVENOUS | Status: DC
  Filled 2020-03-03 (×3): qty 50

## 2020-03-03 MED ORDER — PENICILLIN G POTASSIUM 20000000 UNITS IJ SOLR
4.0000 10*6.[IU] | INTRAVENOUS | Status: DC
  Administered 2020-03-03: 4 10*6.[IU] via INTRAVENOUS
  Filled 2020-03-03 (×6): qty 4

## 2020-03-03 MED ORDER — HYDROCORTISONE NA SUCCINATE PF 100 MG IJ SOLR
50.0000 mg | Freq: Four times a day (QID) | INTRAMUSCULAR | Status: DC
  Administered 2020-03-03 – 2020-03-09 (×26): 50 mg via INTRAVENOUS
  Filled 2020-03-03 (×25): qty 2

## 2020-03-03 MED ORDER — NOREPINEPHRINE 16 MG/250ML-% IV SOLN
0.0000 ug/min | INTRAVENOUS | Status: DC
  Administered 2020-03-03: 8 ug/min via INTRAVENOUS
  Administered 2020-03-03: 15 ug/min via INTRAVENOUS
  Filled 2020-03-03 (×4): qty 250

## 2020-03-03 MED ORDER — MAGNESIUM SULFATE 2 GM/50ML IV SOLN: 2.0000 g | Freq: Once | INTRAVENOUS | Status: AC

## 2020-03-03 MED ORDER — POTASSIUM CHLORIDE 20 MEQ PO PACK: 40.0000 meq | PACK | Freq: Four times a day (QID) | ORAL | Status: DC

## 2020-03-03 MED ORDER — DOPAMINE-DEXTROSE 3.2-5 MG/ML-% IV SOLN
0.0000 ug/kg/min | INTRAVENOUS | Status: DC
  Administered 2020-03-03: 10 ug/kg/min via INTRAVENOUS
  Administered 2020-03-04: 16 ug/kg/min via INTRAVENOUS
  Administered 2020-03-04: 8 ug/kg/min via INTRAVENOUS
  Filled 2020-03-03 (×4): qty 250

## 2020-03-03 MED ORDER — CEFAZOLIN SODIUM-DEXTROSE 2-4 GM/100ML-% IV SOLN
2.0000 g | Freq: Three times a day (TID) | INTRAVENOUS | Status: DC
  Administered 2020-03-03: 2 g via INTRAVENOUS
  Filled 2020-03-03 (×3): qty 100

## 2020-03-03 MED ORDER — CLINDAMYCIN PHOSPHATE 600 MG/50ML IV SOLN
600.0000 mg | Freq: Three times a day (TID) | INTRAVENOUS | Status: DC
  Administered 2020-03-03 – 2020-03-08 (×16): 600 mg via INTRAVENOUS
  Filled 2020-03-03 (×19): qty 50

## 2020-03-03 MED ORDER — MAGNESIUM SULFATE 2 GM/50ML IV SOLN
INTRAVENOUS | Status: AC
  Administered 2020-03-03: 2 g via INTRAVENOUS
  Filled 2020-03-03: qty 50

## 2020-03-03 MED ORDER — LACTATED RINGERS IV BOLUS
2000.0000 mL | Freq: Once | INTRAVENOUS | Status: AC
  Administered 2020-03-03: 2000 mL via INTRAVENOUS

## 2020-03-03 MED ORDER — ACETAMINOPHEN 650 MG RE SUPP
650.0000 mg | RECTAL | Status: DC | PRN
  Administered 2020-03-03: 650 mg via RECTAL
  Filled 2020-03-03: qty 1

## 2020-03-03 MED ORDER — VASOPRESSIN 20 UNIT/ML IV SOLN
0.0300 [IU]/min | INTRAVENOUS | Status: DC
  Administered 2020-03-03 – 2020-03-08 (×6): 0.03 [IU]/min via INTRAVENOUS
  Filled 2020-03-03 (×7): qty 2

## 2020-03-03 NOTE — Progress Notes (Signed)
Pharmacy Antibiotic Note  Joanne [name] is a 49 y.o. female admitted on 03/02/2020 with sepsis.  Pharmacy has been consulted for Vancomycin and Zosyn dosing.  Plan: Zosyn 3.375g IV q8h (4 hour infusion).   Vancomycin 1000 mg IV Q 24 hrs. Goal AUC 400-550. Expected AUC: 518.1, Css min 14.5 SCr used: 1.85  Height: 5\' 7"  (170.2 cm) Weight: 79.4 kg (175 lb 0.7 oz) IBW/kg (Calculated) : 61.6  Temp (24hrs), Avg:99.5 F (37.5 C), Min:97.6 F (36.4 C), Max:104.1 F (40.1 C)  Recent Labs  Lab 03/02/20 1430 03/02/20 1431 03/03/20 0051  WBC  --  30.0*  --   CREATININE  --  1.85*  --   LATICACIDVEN 2.3*  --  2.2*    Estimated Creatinine Clearance: 39.5 mL/min (A) (by C-G formula based on SCr of 1.85 mg/dL (H)).    Allergies  Allergen Reactions  . Kaopectate  [Attapulgite] Itching  . Thiopental Itching  . Tomato     Antimicrobials this admission:   >>    >>   Dose adjustments this admission:   Microbiology results:  BCx:   UCx:    Sputum:    MRSA PCR:   Thank you for allowing pharmacy to be a part of this patient's care.  05/03/20 A 03/03/2020 2:07 AM

## 2020-03-03 NOTE — TOC Initial Note (Signed)
Transition of Care [facility]) - Initial/Assessment Note    Patient Details  Name: Joanne [name] MRN: [id number] Date of Birth: 1971/09/30  [facility]) CM/SW Contact:    Magnus Ivan, LCSW Phone Number: 03/03/2020, 11:23 AM  Clinical Narrative:         Per rounds, patient is nonverbal at baseline. Per chart review, patient's aunt [name]) is her Legal Guardian. CSW spoke with [facility] via phone. [name] confirmed patient was at [facility] prior to this hospitalization. [name] confirmed the plan would be for patient to return there at discharge if appropriate/recommended. [name] gave permission for CSW to reach out to [facility] as well.  CSW spoke with Home who confirmed that patient is a long-term resident there (has been there almost a year per Claiborne Billings). Claiborne Billings reported they are able to accept patient back when patient is medically ready for discharge.  CSW will continue to follow for updates/needs.   Expected Discharge Plan: Skilled Nursing Facility Barriers to Discharge: Continued Medical Work up   Patient Goals and CMS Choice   CMS Medicare.gov Compare Post Acute Care list provided to:: Legal Guardian Choice offered to / list presented to : [place] / Guardian  Expected Discharge Plan and Services Expected Discharge Plan: [place]       Living arrangements for the past 2 months: [place]                                      Prior Living Arrangements/Services Living arrangements for the past 2 months: [place] Lives with:: Facility Resident Patient language and need for interpreter reviewed:: Yes(Patient is non verbal at baseline)        Need for Family Participation in Patient Care: Yes (Comment) Care giver support system in place?: Yes (comment)   Criminal Activity/Legal Involvement Pertinent to Current Situation/Hospitalization: No - Comment  as needed  Activities of Daily Living      Permission Sought/Granted Permission sought to share information with : Facility Art therapist granted to share information with : Yes, Verbal Permission Granted(permission given by Legal Guardian/Aunt)     Permission granted to share info w AGENCY: [facility] (SNF)        Emotional Assessment   Attitude/Demeanor/Rapport: Unable to Assess Affect (typically observed): Unable to Assess   Alcohol / Substance Use: Not Applicable Psych Involvement: No (comment)  Admission diagnosis:  Hypernatremia [E87.0] Septic shock (HCC) [A41.9, R65.21] Severe sepsis with acute organ dysfunction ([place]) [A41.9, R65.20] Patient Active Problem List   Diagnosis Date Noted  . Septic shock ([place]) 03/02/2020  . Hypernatremia 11/15/2019  . Thrombocytopenia ([place]) 11/15/2019  . AKI (acute kidney injury) ([place]) 11/15/2019  . HTN (hypertension) 11/15/2019  . Non-insulin dependent type 2 diabetes mellitus ([place]) 11/15/2019  . Hypothyroidism 11/15/2019  . Sepsis ([place]) 11/15/2019  . Left hemiplegia ([place]) 11/15/2019  . Pressure injury of skin 11/15/2019  . Aspiration pneumonia due to gastric secretions ([place])   . Acute kidney injury (AKI) with acute tubular necrosis (ATN) (HCC)   . Traumatic brain injury ([place])   . Acute on chronic respiratory failure with hypoxia ([place]) 06/06/2018  . Tracheostomy status ([place]) 06/06/2018  . Pneumonia due to Klebsiella pneumoniae ([place]) 06/06/2018  . Acute on chronic combined systolic and diastolic CHF (congestive heart failure) ([place]) 06/06/2018  . Sepsis secondary to UTI (  HCC) 06/06/2018  . ARDS (adult respiratory distress syndrome) (HCC) 06/06/2018  . Acute on chronic respiratory failure with hypoxia (HCC) 06/06/2018  . 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   PCP:  Patient, No Pcp Per Pharmacy:  No Pharmacies Listed    Social Determinants of Health (SDOH) Interventions    Readmission Risk  Interventions Readmission Risk Prevention Plan 03/03/2020  Transportation Screening Complete  PCP or Specialist Appt within 3-5 Days Complete  HRI or Home Care Consult Complete  Palliative Care Screening Complete  Medication Review (RN Care Manager) Complete  Some recent data might be hidden

## 2020-03-03 NOTE — Progress Notes (Signed)
Pt lethargic throughout the shift, had a period of bradycardia dipping from the low 50s to the high 40s, changed pressor from levophed to dopamine, heart rate began to elevate.  Family came to bedside toward the end of the shift, updated family on status of patient.  Educated family on wounds and antibiotics.  Heart rate beginning to drop again, MD informed, no new orders given.

## 2020-03-03 NOTE — Progress Notes (Signed)
Inpatient Diabetes Program Recommendations  AACE/ADA: New Consensus Statement on Inpatient Glycemic Control (2015)  Target Ranges:  Prepandial:   less than 140 mg/dL      Peak postprandial:   less than 180 mg/dL (1-2 hours)      Critically ill patients:  140 - 180 mg/dL   Lab Results  Component Value Date   GLUCAP 238 (H) 03/03/2020   HGBA1C 6.1 (H) 03/03/2020    Review of Glycemic Control Results for CHAYA, DEHAAN (MRN [id number]) as of 03/03/2020 13:40  Ref. Range 03/03/2020 05:58 03/03/2020 07:29 03/03/2020 11:11  Glucose-Capillary Latest Ref Range: 70 - 99 mg/dL 259 (H) 563 (H) 875 (H)   Diabetes history: DM 2 Outpatient Diabetes medications: none Current orders for Inpatient glycemic control:  Novolog 0-15 units Q4 hours  Inpatient Diabetes Program Recommendations:    Solu-cortef 50 mg Q6 hours  If glucose trends continue to be elevated and steroid dose remains the same, consider Levemir 8 units Q24 hours.  Thanks,  Christena Deem RN, MSN, BC-ADM Inpatient Diabetes Coordinator Team Pager [phone] (8a-5p)

## 2020-03-03 NOTE — Consult Note (Signed)
WOC Nurse Consult Note: Reason for Consult: Chronic, nonhealing wounds to sacrum, bilateral ITs and left heel Wound type:Pressure, pressure plus moisture (IAD) Pressure Injury POA: Yes Measurement: Sacral Stage 3 PI: 2cm x 1.5cm x 0.5cm with undermining measuring 4.5cm at 12 o'clock, 2cm at 3 o'clock, 2.5cm at 6 o'clock and 2.5cm at 9 o'clock. Red wound bed, serous to light yellow exudate in a moderate amount. Left IT: Healed PI (full thickness) with scar tissue without return of pigment. Right IT Stage 3: pressure injury measuring 5cm x 6.5cm x 0.2cm with 40% yellow and 60% red wound bed. Small amount of serous exudate. Slightly macerated periwound due to urinary incontinence. Left heel Unstageable:  3cm x 2.5cm area of flat, dry eschar. No exudate. Wound bed:As described above Drainage (amount, consistency, odor) As described above Periwound: intact, dry Dressing procedure/placement/frequency: Patient is on a mattress replacement while in ICU.  I have provided Nursing with guidance via the Orders for continuation of this therapy upon transfer to floor. 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 Sacral PI is with undermining related to shearing.  WOC nursing team will not follow, but will remain available to this patient, the nursing and medical teams.  Please re-consult if needed. Thanks, Ladona Mow, MSN, RN, GNP, [place]  Pager# [phone]

## 2020-03-03 NOTE — Progress Notes (Signed)
Patient from ed from facility. Patient has a trach #6 xlt distal. With passy muir valve in place.  Patient in no distress. BBS clear to diminished. On room air with o2 sat noted at 100%. Trach ties and inner cannula changed. Supplies at bedside.
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
## 2020-03-03 NOTE — Progress Notes (Signed)
SLP Cancellation Note  Patient Details Name: Joanne [name] MRN: [id number] DOB: 1971/10/20   Cancelled treatment:       Reason Eval/Treat Not Completed: (chart reviewed; NSG consulted; noted HR issues this PM). Pt continues to be drowsy/lethargic per NSG though opening eyes some. She is nonverbal at baseline; old TBI. She has a tracheostomy w/ PMV in place upon arrival to CCU which was removed d/t pt's lethargy. Pt has a h/o "Aspiration pneumonia due to gastric secretions" per chart notes.  Due to pt's current presentation, and increased risk for aspiration to occur at this time, will hold on BSE until pt is fully alert/awake and using PMV comfortably w/out s/s of distress. Recommend frequent oral care for hygiene and stimulation of swallowing.  ST services will f/u in the morning. NSG agreed.     Jerilynn Som, MS, CCC-SLP Azeem Poorman 03/03/2020, 2:52 PM

## 2020-03-03 NOTE — Progress Notes (Signed)
PHARMACY CONSULT NOTE  Pharmacy Consult for Electrolyte Monitoring and Replacement   Recent Labs: Potassium (mmol/L)  Date Value  03/03/2020 2.9 (L)   Magnesium (mg/dL)  Date Value  47/09/5270 1.7   Calcium (mg/dL)  Date Value  29/29/0903 8.1 (L)   Albumin (g/dL)  Date Value  01/49/9692 2.3 (L)   Phosphorus (mg/dL)  Date Value  49/32/4199 2.0 (L)   Sodium (mmol/L)  Date Value  03/03/2020 149 (H)     Assessment: 49 year old female presented with altered mental status. Patient with h/o TBI with tracheostomy. Patient with strep pyogenes bacteremia. She has sacral and left heel decubitus ulcers as well as a wound to the right posterior leg. Pharmacy to manage electrolytes. Electrolytes low, concerning for refeeding.  Goal of Therapy:  Electrolytes WNL  Plan:  Sodium elevated. Patient started on D5.  Potassium 10 mEq IV x 5 runs initially ordered. Phos returned and is also low. Will add potassium phos 10 mmol IV x 1. This will give 65 mEq potassium total. Will follow up electrolytes with morning labs and replace as indicated.  Pricilla Riffle ,PharmD Clinical Pharmacist 03/03/2020 1:15 PM

## 2020-03-03 NOTE — Progress Notes (Signed)
PHARMACY - PHYSICIAN COMMUNICATION CRITICAL VALUE ALERT - BLOOD CULTURE IDENTIFICATION (BCID)  Joanne [name] is an 49 y.o. female who presented to [facility] on 03/02/2020 with a chief complaint of sepsis  Assessment:  Lab reports strep pyogenes  Name of physician (or Provider) ContactedLeanord Asal, NP  Current antibiotics: Zosyn and Vancomycin  Changes to prescribed antibiotics recommended: Pen G 41mu q4h + Clinda 600mg  q8h Recommendations accepted by provider, d/c Vanc but keep Zosyn on board for now due to UTI  Results for orders placed or performed during the hospital encounter of 03/02/20  Blood Culture ID Panel (Reflexed) (Collected: 03/02/2020  2:31 PM)  Result Value Ref Range   Enterococcus species NOT DETECTED NOT DETECTED   Listeria monocytogenes NOT DETECTED NOT DETECTED   Staphylococcus species NOT DETECTED NOT DETECTED   Staphylococcus aureus (BCID) NOT DETECTED NOT DETECTED   Streptococcus species DETECTED (A) NOT DETECTED   Streptococcus agalactiae NOT DETECTED NOT DETECTED   Streptococcus pneumoniae NOT DETECTED NOT DETECTED   Streptococcus pyogenes DETECTED (A) NOT DETECTED   Acinetobacter baumannii NOT DETECTED NOT DETECTED   Enterobacteriaceae species NOT DETECTED NOT DETECTED   Enterobacter cloacae complex NOT DETECTED NOT DETECTED   Escherichia coli NOT DETECTED NOT DETECTED   Klebsiella oxytoca NOT DETECTED NOT DETECTED   Klebsiella pneumoniae NOT DETECTED NOT DETECTED   Proteus species NOT DETECTED NOT DETECTED   Serratia marcescens NOT DETECTED NOT DETECTED   Haemophilus influenzae NOT DETECTED NOT DETECTED   Neisseria meningitidis NOT DETECTED NOT DETECTED   Pseudomonas aeruginosa NOT DETECTED NOT DETECTED   Candida albicans NOT DETECTED NOT DETECTED   Candida glabrata NOT DETECTED NOT DETECTED   Candida krusei NOT DETECTED NOT DETECTED   Candida parapsilosis NOT DETECTED NOT DETECTED   Candida tropicalis NOT DETECTED NOT DETECTED    05/02/2020  A 03/03/2020  4:18 AM

## 2020-03-03 NOTE — Procedures (Signed)
Arterial Catheter Insertion Procedure Note SHEVY YANEY [id number] Oct 03, 1971  Procedure: Insertion of Arterial Catheter  Indications: Blood pressure monitoring and Frequent blood sampling  Procedure Details Consent: Risks of procedure as well as the alternatives and risks of each were explained to the (patient/caregiver).  Consent for procedure obtained. Time Out: Verified patient identification, verified procedure, site/side was marked, verified correct patient position, special equipment/implants available, medications/allergies/relevent history reviewed, required imaging and test results available.  Performed  Maximum sterile technique was used including antiseptics, cap, gloves, gown, hand hygiene, mask and sheet. Skin prep: Chlorhexidine; local anesthetic administered 20 gauge catheter was inserted into right femoral artery using the Seldinger technique. ULTRASOUND GUIDANCE USED: YES Evaluation Blood flow good; BP tracing good. Complications: No apparent complications.   Procedure was performed using Ultrasound for direct visualization of cannulization of Right Femoral artery.  BIOPATCH applied to insertion site.   Harlon Ditty, AGACNP-BC [facility] Pager: [phone]  Judithe Modest 03/03/2020

## 2020-03-03 NOTE — Procedures (Signed)
Central Venous Catheter Insertion Procedure Note Joanne [name] [id number] 1971-06-17  Procedure: Insertion of Central Venous Catheter Indications: Assessment of intravascular volume, Drug and/or fluid administration and Frequent blood sampling  Procedure Details Consent: Risks of procedure as well as the alternatives and risks of each were explained to the (patient/caregiver).  Consent for procedure obtained. Time Out: Verified patient identification, verified procedure, site/side was marked, verified correct patient position, special equipment/implants available, medications/allergies/relevent history reviewed, required imaging and test results available.  Performed  Maximum sterile technique was used including antiseptics, cap, gloves, gown, hand hygiene, mask and sheet. Skin prep: Chlorhexidine; local anesthetic administered A antimicrobial bonded/coated triple lumen catheter was placed in the right femoral vein due to multiple attempts, no other available access using the Seldinger technique.  Evaluation Blood flow good Complications: No apparent complications Patient did tolerate procedure well. Chest X-ray ordered to verify placement.  CXR: Not needed, placed in right femoral vein.   Procedure was performed using Ultrasound for direct visualization of cannulization of Right Femoral vein.  Line was secured at the 20 cm mark.  BIOPATCH was applied to the insertion site.    Harlon Ditty, AGACNP-BC [place] [facility] Pager: [phone]  Judithe Modest 03/03/2020, 12:45 AM

## 2020-03-03 NOTE — Progress Notes (Signed)
Pt heart rate dropping to mid-low 50s with occasional drops to mid-upper 40s, contacted Dr through messenger and phone call, no answer.  Will page again.

## 2020-03-04 DIAGNOSIS — R6521 Severe sepsis with septic shock: Secondary | ICD-10-CM

## 2020-03-04 DIAGNOSIS — L98429 Non-pressure chronic ulcer of back with unspecified severity: Secondary | ICD-10-CM

## 2020-03-04 DIAGNOSIS — Z7189 Other specified counseling: Secondary | ICD-10-CM

## 2020-03-04 DIAGNOSIS — A419 Sepsis, unspecified organism: Secondary | ICD-10-CM

## 2020-03-04 LAB — CBC WITH DIFFERENTIAL/PLATELET
Abs Immature Granulocytes: 1.18 10*3/uL — ABNORMAL HIGH (ref 0.00–0.07)
Basophils Absolute: 0.2 10*3/uL — ABNORMAL HIGH (ref 0.0–0.1)
Basophils Relative: 0 %
Eosinophils Absolute: 0 10*3/uL (ref 0.0–0.5)
Eosinophils Relative: 0 %
HCT: 32.4 % — ABNORMAL LOW (ref 36.0–46.0)
Hemoglobin: 10.6 g/dL — ABNORMAL LOW (ref 12.0–15.0)
Immature Granulocytes: 3 %
Lymphocytes Relative: 4 %
Lymphs Abs: 1.3 10*3/uL (ref 0.7–4.0)
MCH: 28.2 pg (ref 26.0–34.0)
MCHC: 32.7 g/dL (ref 30.0–36.0)
MCV: 86.2 fL (ref 80.0–100.0)
Monocytes Absolute: 0.7 10*3/uL (ref 0.1–1.0)
Monocytes Relative: 2 %
Neutro Abs: 32.1 10*3/uL — ABNORMAL HIGH (ref 1.7–7.7)
Neutrophils Relative %: 91 %
Platelets: 167 10*3/uL (ref 150–400)
RBC: 3.76 MIL/uL — ABNORMAL LOW (ref 3.87–5.11)
RDW: 14.3 % (ref 11.5–15.5)
Smear Review: NORMAL
WBC: 35.6 10*3/uL — ABNORMAL HIGH (ref 4.0–10.5)
nRBC: 0.1 % (ref 0.0–0.2)

## 2020-03-04 LAB — BASIC METABOLIC PANEL
Anion gap: 9 (ref 5–15)
BUN: 31 mg/dL — ABNORMAL HIGH (ref 6–20)
CO2: 23 mmol/L (ref 22–32)
Calcium: 7.2 mg/dL — ABNORMAL LOW (ref 8.9–10.3)
Chloride: 100 mmol/L (ref 98–111)
Creatinine, Ser: 0.62 mg/dL (ref 0.44–1.00)
GFR calc Af Amer: >60 mL/min (ref >60)
GFR calc non Af Amer: >60 mL/min (ref >60)
Glucose, Bld: 238 mg/dL — ABNORMAL HIGH (ref 70–99)
Potassium: 4.1 mmol/L (ref 3.5–5.1)
Sodium: 132 mmol/L — ABNORMAL LOW (ref 135–145)

## 2020-03-04 LAB — GLUCOSE, CAPILLARY
Glucose-Capillary: 160 mg/dL — ABNORMAL HIGH (ref 70–99)
Glucose-Capillary: 165 mg/dL — ABNORMAL HIGH (ref 70–99)
Glucose-Capillary: 100 mg/dL — ABNORMAL HIGH (ref 70–99)
Glucose-Capillary: 128 mg/dL — ABNORMAL HIGH (ref 70–99)
Glucose-Capillary: 132 mg/dL — ABNORMAL HIGH (ref 70–99)
Glucose-Capillary: 160 mg/dL — ABNORMAL HIGH (ref 70–99)

## 2020-03-04 LAB — PROCALCITONIN: Procalcitonin: 2 ng/mL

## 2020-03-04 LAB — PHOSPHORUS: Phosphorus: 3.2 mg/dL (ref 2.5–4.6)

## 2020-03-04 LAB — MAGNESIUM: Magnesium: 1.8 mg/dL (ref 1.7–2.4)

## 2020-03-04 MED ORDER — FLUCONAZOLE IN SODIUM CHLORIDE 200-0.9 MG/100ML-% IV SOLN
200.0000 mg | Freq: Once | INTRAVENOUS | Status: AC
  Administered 2020-03-04: 200 mg via INTRAVENOUS
  Filled 2020-03-04: qty 100

## 2020-03-04 MED ORDER — LACTATED RINGERS IV BOLUS
1000.0000 mL | Freq: Once | INTRAVENOUS | Status: AC
  Administered 2020-03-04: 1000 mL via INTRAVENOUS

## 2020-03-04 MED ORDER — MORPHINE SULFATE (PF) 2 MG/ML IV SOLN
1.0000 mg | Freq: Once | INTRAVENOUS | Status: AC
  Administered 2020-03-04: 1 mg via INTRAVENOUS
  Filled 2020-03-04: qty 1

## 2020-03-04 MED ORDER — FLUCONAZOLE 100MG IVPB
100.0000 mg | INTRAVENOUS | Status: DC
  Filled 2020-03-04: qty 50

## 2020-03-04 MED ORDER — PIPERACILLIN-TAZOBACTAM 3.375 G IVPB
3.3750 g | Freq: Three times a day (TID) | INTRAVENOUS | Status: DC
  Administered 2020-03-04 – 2020-03-08 (×12): 3.375 g via INTRAVENOUS
  Filled 2020-03-04 (×16): qty 50

## 2020-03-04 MED ORDER — ADULT MULTIVITAMIN W/MINERALS CH
1.0000 | ORAL_TABLET | Freq: Every day | ORAL | Status: DC
  Administered 2020-03-05 – 2020-03-16 (×11): 1 via ORAL
  Filled 2020-03-04 (×11): qty 1

## 2020-03-04 MED ORDER — MORPHINE SULFATE (PF) 2 MG/ML IV SOLN
1.0000 mg | INTRAVENOUS | Status: DC | PRN
  Administered 2020-03-04 – 2020-03-10 (×9): 1 mg via INTRAVENOUS
  Filled 2020-03-04 (×10): qty 1
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
## 2020-03-04 NOTE — Consult Note (Signed)
[facility] SURGICAL CONSULTATION NOTE (initial) - cpt: [phone]   HISTORY OF PRESENT ILLNESS (HPI):  Patient does not contribute much to history secondary to chronic medical conditions and severe deconditioning, history obtained primarily through chart review:   In summary, 49 y.o. female past medical history of traumatic brain injury, combined SD heart failure, acute on chronic respiratory failure with hypoxia and chronic trach, arts, diabetes, pneumonia due to Klebsiella, seizure disorder, acute kidney injury with acute tubular necrosis, resident of long-term care facility [facility], multiple large ventral hernias containing large and small bowel loops, large stool burden in the rectosigmoid colon, chronic stable coronal decubitus with tunneling, ulceration to the left heel and wound to the right posterior leg admitted on 03/02/2020 with septic shock due to UTI, strep pyogenes bacteremia. Initial work-up in the ED reveals sodium 157, BUN 79, creatinine 1.85, glucose 213, WBC 30, procalcitonin 7.11, lactic acid 2.3, BNP 186.  Her COVID-19 PCR is negative.  Urinalysis is positive for urinary tract infection.  Chest x-ray without any acute disease.  CT abdomen pelvis is concerning for patchy groundglass airspace opacities in the left lower lobe indicating possible early pneumonia, fatty liver, large stool burden in the rectosigmoid colon, and multiple large ventral hernias containing large and small bowel loops, but no evidence of obstruction. She was admitted to the ICU for management. She has been getting IV ABx (Clindamycin + Zosyn). She was requiring vasopressor support but this is stopped. Sepsis concerning for UTI vs Pneumonia vs decubitus ulcerations. She was evaluated by [facility] RN on 05/12 and recommended local wound care and frequent repositioning.   Surgery is consulted by PCCM physician Dr. Erin Fulling, MD in this context for evaluation and management of sacral decubitus  ulceration in the setting of sepsis.   PAST MEDICAL HISTORY (PMH):  Past Medical History:  Diagnosis Date  . Acute kidney injury (AKI) with acute tubular necrosis (ATN) (HCC)   . Acute on chronic combined systolic and diastolic CHF (congestive heart failure) (HCC) 06/06/2018  . Acute on chronic respiratory failure with hypoxia (HCC) 06/06/2018  . ARDS (adult respiratory distress syndrome) (HCC) 06/06/2018  . Aspiration pneumonia due to gastric secretions (HCC)   . Diabetes mellitus (HCC)   . History of traumatic brain injury   . Hypothyroidism   . Pneumonia due to Klebsiella pneumoniae (HCC) 06/06/2018  . Seizure disorder (HCC)   . Severe sepsis (HCC) 06/06/2018  . 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 Traumatic brain injury (HCC)      PAST SURGICAL HISTORY (PSH):  Past Surgical History:  Procedure Laterality Date  . TRACHEOSTOMY       MEDICATIONS:  Prior to Admission medications   Medication Sig Start Date End Date Taking? Authorizing Provider  acetaminophen (TYLENOL) 325 MG tablet Take 2 tablets (650 mg total) by mouth every 6 (six) hours as needed for mild pain or fever (or Fever >/= 101). 11/20/19   Charise Killian, MD  aspirin 81 MG EC tablet aspirin 81 mg tablet,delayed release  Take 1 tablet every day by oral route.    [provider]  B Complex-C-E-Zn (B COMPLEX-C-E-ZINC) tablet Take by mouth.    [provider]  carbamazepine (TEGRETOL XR) 100 MG 12 hr tablet Take 100 mg by mouth 2 (two) times daily.    [provider]  carvedilol (COREG) 12.5 MG tablet Take 12.5 mg by mouth 2 (two) times daily. 09/10/19   [provider]  cetirizine (ZYRTEC) 10 MG tablet Take by  mouth.    [provider]  cyanocobalamin 50 MCG tablet Take by mouth.    [provider]  famotidine (PEPCID) 10 MG tablet Take 20 mg by mouth 2 (two) times daily.    [provider]  famotidine (PEPCID) 20 MG tablet Take 20 mg by mouth 2 (two)  times daily. 02/10/20   [provider]  ferrous sulfate 325 (65 FE) MG tablet Take 325 mg by mouth daily.    [provider]  folic acid (FOLVITE) 1 MG tablet Take 1 mg by mouth daily. 07/28/19   [provider]  levothyroxine (SYNTHROID) 100 MCG tablet Take 100 mcg by mouth daily. 07/18/19   [provider]  loratadine (CLARITIN) 10 MG tablet Take 20 mg by mouth daily.    [provider]  metoprolol tartrate (LOPRESSOR) 25 MG tablet Take by mouth.    [provider]  MULTIPLE VITAMIN PO Take 1 tablet by mouth daily.    [provider]  polyethylene glycol powder (GLYCOLAX/MIRALAX) 17 GM/SCOOP powder Take 17 g by mouth daily.    [provider]  Polyvinyl Alcohol-Povidone PF 1.4-0.6 % SOLN Place 1 drop into both eyes 3 (three) times daily.    [provider]  tamsulosin (FLOMAX) 0.4 MG CAPS capsule Take 0.4 mg by mouth daily. 06/16/19   [provider]  traMADol (ULTRAM) 50 MG tablet Take 25 mg by mouth 3 (three) times daily. 08/05/19   [provider]     ALLERGIES:  Allergies  Allergen Reactions  . Kaopectate  [Attapulgite] Itching  . Thiopental Itching  . Tomato      SOCIAL HISTORY:  Social History   Socioeconomic History  . Marital status: Single    Spouse name: Not on file  . Number of children: Not on file  . Years of education: Not on file  . Highest education level: Not on file  Occupational History  . Not on file  Tobacco Use  . Smoking status: Never Smoker  . Smokeless tobacco: Never Used  Substance and Sexual Activity  . Alcohol use: Not Currently  . Drug use: Not Currently  . Sexual activity: Not Currently  Other Topics Concern  . Not on file  Social History Narrative  . Not on file   Social Determinants of Health   Financial Resource Strain:   . Difficulty of Paying Living Expenses:   Food Insecurity:   . Worried About Programme researcher, broadcasting/film/video in the Last Year:   .  Barista in the Last Year:   Transportation Needs:   . Freight forwarder (Medical):   Marland Kitchen Lack of Transportation (Non-Medical):   Physical Activity:   . Days of Exercise per Week:   . Minutes of Exercise per Session:   Stress:   . Feeling of Stress :   Social Connections:   . Frequency of Communication with Friends and Family:   . Frequency of Social Gatherings with Friends and Family:   . Attends Religious Services:   . Active Member of Clubs or Organizations:   . Attends Banker Meetings:   Marland Kitchen Marital Status:   Intimate Partner Violence:   . Fear of Current or Ex-Partner:   . Emotionally Abused:   Marland Kitchen Physically Abused:   . Sexually Abused:      FAMILY HISTORY:  Family History  Family history unknown: Yes      REVIEW OF SYSTEMS:  Review of Systems  Unable to perform  ROS: Medical condition  Skin:       + Sacral Ulceration    VITAL SIGNS:  Temp:  [97.5 F (36.4 C)-98 F (36.7 C)] 97.5 F (36.4 C) (05/13 0800) Pulse Rate:  [46-121] 92 (05/13 1000) Resp:  [0-29] 22 (05/13 1000) BP: (73-135)/(58-100) 83/61 (05/13 1000) SpO2:  [90 %-100 %] 92 % (05/13 1000) Arterial Line BP: (78-137)/(49-89) 84/69 (05/13 1000) Weight:  [67.4 kg] 67.4 kg (05/13 0500)     Height: 5\' 7"  (170.2 cm) Weight: 67.4 kg BMI (Calculated): 23.27   INTAKE/OUTPUT:  05/12 0701 - 05/13 0700 In: 4133.7 [I.V.:1473.1; IV Piggyback:2660.6] Out: 2550 [Urine:2550]  PHYSICAL EXAM:  Physical Exam Constitutional:      General: She is not in acute distress.    Appearance: She is not ill-appearing.     Comments: Patient sitting up in bed, severely debilitated  Eyes:     General: No scleral icterus.    Conjunctiva/sclera: Conjunctivae normal.  Neck:     Trachea: Tracheostomy present.   Cardiovascular:     Rate and Rhythm: Normal rate and regular rhythm.     Pulses: Normal pulses.  Pulmonary:     Comments: Tracheostomy Genitourinary:    Comments: Deferred Musculoskeletal:      Right lower leg: No edema.     Left lower leg: No edema.     Comments: Chronic contracture to the LUE  Skin:    General: Skin is warm and dry.          Comments: Multiple stage 1-2 pressure injuries to the bilateral ischial regions, again without evidence of infection  Neurological:     Mental Status: She is alert.     Comments: History of TBI  Psychiatric:     Comments: Unable to reliably assess      Labs:  CBC Latest Ref Rng & Units 03/04/2020 03/03/2020 03/02/2020  WBC 4.0 - 10.5 K/uL 35.6(H) 25.9(H) 30.0(H)  Hemoglobin 12.0 - 15.0 g/dL 10.6(L) 10.1(L) 13.9  Hematocrit 36.0 - 46.0 % 32.4(L) 33.4(L) 44.4  Platelets 150 - 400 K/uL 167 162 221   CMP Latest Ref Rng & Units 03/04/2020 03/03/2020 03/03/2020  Glucose 70 - 99 mg/dL 05/03/2020) - -  BUN 6 - 20 mg/dL 373(S) - -  Creatinine 28(J - 1.00 mg/dL 6.81 - -  Sodium 1.57 - 145 mmol/L 132(L) 143 142  Potassium 3.5 - 5.1 mmol/L 4.1 - -  Chloride 98 - 111 mmol/L 100 - -  CO2 22 - 32 mmol/L 23 - -  Calcium 8.9 - 10.3 mg/dL 7.2(L) - -  Total Protein 6.5 - 8.1 g/dL - - -  Total Bilirubin 0.3 - 1.2 mg/dL - - -  Alkaline Phos 38 - 126 U/L - - -  AST 15 - 41 U/L - - -  ALT 0 - 44 U/L - - -     Imaging studies:   CT Abdomen/Pelvis (03/02/2020) personally reviewed and agree with radiologist report below:  IMPRESSION: Patchy ground-glass airspace opacities posteriorly in the left lower lobe could reflect early pneumonia.  Fatty infiltration of the liver.  Large stool burden in the rectosigmoid colon which may reflect early fecal impaction.  Multiple large ventral hernias containing large and small bowel loops. No evidence of bowel obstruction.    Assessment/Plan: (ICD-10's: L21.429) 49 y.o. female with with stage 3 sacral decubitus ulceration and multiple stage 1-2 ischial ulcerations without evidence of infection admitted with sepsis favor other etiology as source than sacral wound, complicated by pertinent comorbidities  including severe debilitation, TBI, and tracheostomy dependence.   - No emergent surgical intervention  - We could consider unroofing her sacral wound however I do not know if this would be of any benefit as it does no appear infected and we would be creating a wound that would likely not heal in her current condition   - Agree with WOC RN recommendations; packing sacral wound with 1 in iodoform cause and covering with santyl dressing, frequent repositioning, consider low air loss mattress if feasible   - I do agree with palliative care to discuss GOS and long term goals with family  - Continue IV ABx; appreciate ID recommnedation   - Further management per primary service  All of the above findings and recommendations were discussed with the medical team  Thank you for the opportunity to participate in this patient's care.   -- Edison Simon, PA-C [facility] 03/04/2020, 11:23 AM [id number] M-F: 7am - 4pm
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
## 2020-03-04 NOTE — Progress Notes (Addendum)
Palliative: [name], Joanne [name], is resting quietly in bed.  She appears acutely/chronically ill and frail.  She is on 2 vasopressors at this time with blood pressures 80s - 120s / 60s - 80s, and heart rate 80s to 90s.  Joanne [name] is able to tell me her name, that we are in the hospital, but not the month.  She is able to make her basic needs known.  But there is no family at bedside at this time.  I talk with Joanne [name] about "life support".  If we are doing what we can, treating the treatable, and she still worsens so we put her back on life support.  Joanne [name] tells me that this is a hard choice, and she will consider/pray about it. Joanne [name] does have a legal guardian, and although she may be alert and oriented at times, she cannot make her own decisions.  Call to aunt/HCPOA, Joanne [name].  She tells me that she and her son Joanne [name] visited yesterday and saw p[citures of Joanne [name]'s wounds.  She tells me that Joanne [name] looked like she was "sleeping normally", sharing that they felt that she was sedated.  I shared that Joanne [name] was sleepy because she was so very sick.    We talk about Joanne [name]'s wounds and surgical consult. No surgical intervention at this time.   Joanne [name] tells me that she has seen [place] this sick in the past and "she came out of it".  I do not further discuss code status today as Joanne [name] makes it clear that she does not see Joanne [name] as critically ill.   Conference with registered dietitian who shares that Joanne [name]'s weight was 205 pounds November 2020, 148 pounds May 2021.  This indicates a 57 pound weight loss in 6 months  Conference with attending related to first do no harm, the medical team's right/responsibility to not perform chest compressions for futile outcomes. Conference with attending, bedside nursing staff, transition of care team related to patient condition, needs, goals of care.  Plan:   At this point, FULL SCOPE/CODE.  Continue CODE STATUS discussions.        40 minutes Joanne Carmel, NP Palliative Medicine Team Team Phone # [phone] Greater than 50% of this time was spent counseling and coordinating care related to the above assessment and plan.

## 2020-03-04 NOTE — Progress Notes (Signed)
Pharmacy Antibiotic Note  Joanne [name] is a 49 y.o. female admitted on 03/02/2020 with sepsis.  Pharmacy has been consulted for Zosyn dosing. Patient with group A streptococcus in blood cultures and now GNR in single set (identification pending). She is on vasopressors.  Has multiple wounds.   Today, 03/04/2020 Day  - WBC elevated (initial improvement 5/12 but increased 5/13 - note on steroids) - Afebrile - AKI resolved  Plan: Zosyn 3.375g IV q8h (4 hour infusion). pending identification of GNR in blood culture    Height: 5\' 7"  (170.2 cm) Weight: 67.4 kg (148 lb 9.4 oz) IBW/kg (Calculated) : 61.6  Temp (24hrs), Avg:97.7 F (36.5 C), Min:97.5 F (36.4 C), Max:98 F (36.7 C)  Recent Labs  Lab 03/02/20 1430 03/02/20 1431 03/03/20 0051 03/03/20 0547 03/04/20 0533  WBC  --  30.0*  --  25.9* 35.6*  CREATININE  --  1.85*  --  1.06* 0.62  LATICACIDVEN 2.3*  --  2.2*  --   --     Estimated Creatinine Clearance: 81.8 mL/min (by C-G formula based on SCr of 0.62 mg/dL).    Allergies  Allergen Reactions  . Kaopectate  [Attapulgite] Itching  . Thiopental Itching  . Tomato     Antimicrobials this admission: 5/11 vanco/cefepime/metronidazole x 1 5/11 pip/tazo >> 5/12 5/12 PCN G >> 5/13 5/12 clinda >> 5/13 pip/tazo >>  Dose adjustments this admission:   Microbiology results: 5/11 Bcx: GPC (4/4 bottles) - BCID = Grp A strep.  GNR x 1 set 5/11 Ucx: E. Coli (R to amp, TMP/SMZ, Intermed Amp/sulb), 2nd GNR pending 5/12 Wound: GPC pairs  Thank you for allowing pharmacy to be a part of this patient's care.  7/12 03/04/2020 9:23 AM

## 2020-03-04 NOTE — Progress Notes (Signed)
Assisted pt with feeding, unable to finish lunch tray due to increasing discomfort in mouth, head, and legs..  Will administer PRN pain medication, reposition, and continue to monitor
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
## 2020-03-04 NOTE — Progress Notes (Signed)
Pt resting in bed with no distress noted. Trach care performed. VSS on 2L Joanne [name]. Pt was able to eat lunch but was too drowsy to eat supper at this time. Vaso and dopamine gtt still infusing.

## 2020-03-04 NOTE — Progress Notes (Signed)
PHARMACY - PHYSICIAN COMMUNICATION CRITICAL VALUE ALERT - BLOOD CULTURE IDENTIFICATION (BCID)  Joanne [name] is an 49 y.o. female who presented to [facility] on 03/02/2020  Assessment:  3/4 strep pyogenes, update 1 set now with E.coli as well  Name of physician (or Provider) Contacted: Dr. Belia Heman, Dr. Rivka Safer  Current antibiotics: penicillin G  Changes to prescribed antibiotics recommended: Zosyn per ID recommendation    Pricilla Riffle, PharmD 03/04/2020  1:18 PM
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
## 2020-03-04 NOTE — Progress Notes (Signed)
CRITICAL CARE NOTE 49 YO WITH  STREP PYOGENES BACTEREMIA FROM SACRAL WOUNDS WITH TBI S/P TRACH   CC  follow up respiratory failure  SUBJECTIVE Patient remains critically ill Prognosis is guarded On multiple vasopressors    BP (!) 105/91   Pulse 88   Temp 97.6 F (36.4 C) (Axillary)   Resp (!) 24   Ht 5\' 7"  (1.702 m)   Wt 67.4 kg   LMP  (LMP Unknown)   SpO2 95%   BMI 23.27 kg/m    I/O last 3 completed shifts: In: 4527.2 [I.V.:1807.7; IV Piggyback:2719.6] Out: 2550 [Urine:2550] No intake/output data recorded.  SpO2: 95 % O2 Flow Rate (L/min): 2 L/min FiO2 (%): 21 %  Estimated body mass index is 23.27 kg/m as calculated from the following:   Height as of this encounter: 5\' 7"  (1.702 m).   Weight as of this encounter: 67.4 kg.  SIGNIFICANT EVENTS   REVIEW OF SYSTEMS  PATIENT IS UNABLE TO PROVIDE COMPLETE REVIEW OF SYSTEMS DUE TO SEVERE CRITICAL ILLNESS   Pressure Injury 11/15/19 Coccyx Stage 3 -  Full thickness tissue loss. Subcutaneous fat may be visible but bone, tendon or muscle are NOT exposed. red;bleeding (Active)  11/15/19 1500  Location: Coccyx  Location Orientation:   Staging: Stage 3 -  Full thickness tissue loss. Subcutaneous fat may be visible but bone, tendon or muscle are NOT exposed.  Wound Description (Comments): red;bleeding  Present on Admission: Yes     Pressure Injury 03/02/20 Thigh Posterior;Proximal;Right Stage 2 -  Partial thickness loss of dermis presenting as a shallow open injury with a red, pink wound bed without slough. (Active)  03/02/20 2100  Location: Thigh  Location Orientation: Posterior;Proximal;Right  Staging: Stage 2 -  Partial thickness loss of dermis presenting as a shallow open injury with a red, pink wound bed without slough.  Wound Description (Comments):   Present on Admission: Yes     Pressure Injury 03/02/20 Heel Left Unstageable - Full thickness tissue loss in which the base of the injury is covered by slough  (yellow, tan, gray, green or brown) and/or eschar (tan, brown or black) in the wound bed. (Active)  03/02/20 2100  Location: Heel  Location Orientation: Left  Staging: Unstageable - Full thickness tissue loss in which the base of the injury is covered by slough (yellow, tan, gray, green or brown) and/or eschar (tan, brown or black) in the wound bed.  Wound Description (Comments):   Present on Admission: Yes      PHYSICAL EXAMINATION:  GENERAL:critically ill appearing, HEAD: Normocephalic, atraumatic.  EYES: Pupils equal, round, reactive to light.  No scleral icterus.  MOUTH: Moist mucosal membrane. NECK: Supple.  PULMONARY: +rhonchi, +wheezing CARDIOVASCULAR: S1 and S2. Regular rate and rhythm. No murmurs, rubs, or gallops.  GASTROINTESTINAL: Soft, nontender, -distended.  Positive bowel sounds.   MUSCULOSKELETAL: No swelling, clubbing, or edema.  NEUROLOGIC: lethargic SKIN:intact,warm,dry  MEDICATIONS: I have reviewed all medications and confirmed regimen as documented   CULTURE RESULTS   Recent Results (from the past 240 hour(s))  Blood Culture (routine x 2)     Status: None (Preliminary result)   Collection Time: 03/02/20  2:31 PM   Specimen: BLOOD  Result Value Ref Range Status   Specimen Description   Final    BLOOD BLOOD RIGHT HAND Performed at [facility], [address], [place], [place] [address]    Special Requests   Final    BOTTLES DRAWN AEROBIC AND ANAEROBIC Blood Culture adequate volume  Performed at [facility], [address] [place], [place] [address]    Culture  Setup Time   Final    GRAM POSITIVE COCCI IN BOTH AEROBIC AND ANAEROBIC BOTTLES CRITICAL VALUE NOTED.  VALUE IS CONSISTENT WITH PREVIOUSLY REPORTED AND CALLED VALUE. Performed at [facility], [address] [place], [place] [address]    Culture   Final    Romie Minus POSITIVE COCCI GRAM NEGATIVE RODS CRITICAL RESULT CALLED TO, READ BACK BY AND VERIFIED WITH: Wallace Keller, AT [phone] 03/04/20 BY Renato Shin Performed at [facility], [address], [place], [place] [address]    Report Status PENDING  Incomplete  Blood Culture (routine x 2)     Status: Abnormal (Preliminary result)   Collection Time: 03/02/20  2:31 PM   Specimen: BLOOD  Result Value Ref Range Status   Specimen Description   Final    BLOOD Blood Culture results may not be optimal due to an inadequate volume of blood received in culture bottles Performed at [facility], [address] [place], [place] [address]    Special Requests   Final    BOTTLES DRAWN AEROBIC AND ANAEROBIC BLOOD LEFT HAND Performed at [facility], [address] [place], [place] [address]    Culture  Setup Time   Final    GRAM POSITIVE COCCI CRITICAL RESULT CALLED TO, READ BACK BY AND VERIFIED WITH: SCOTT HALL @ [phone] 03/03/2020 RH IN BOTH AEROBIC AND ANAEROBIC BOTTLES Performed at [facility], [address] [place], [place] [address]    Culture GROUP A STREP (S.PYOGENES) ISOLATED (A)  Final   Report Status PENDING  Incomplete  Urine culture     Status: Abnormal (Preliminary result)   Collection Time: 03/02/20  2:31 PM   Specimen: In/Out Cath Urine  Result Value Ref Range Status   Specimen Description   Final    IN/OUT CATH URINE Performed at [facility], [address] [place], [place] [address]    Special Requests   Final    NONE Performed at [facility], [address] [place], [place] [address]    Culture (A)  Final    >=100,000 COLONIES/mL ESCHERICHIA COLI 60,000 COLONIES/mL GRAM NEGATIVE RODS    Report Status PENDING  Incomplete   Organism ID, Bacteria ESCHERICHIA COLI (A)  Final      Susceptibility   Escherichia coli - MIC*    AMPICILLIN >=32 RESISTANT Resistant     CEFAZOLIN <=4 SENSITIVE Sensitive     CEFTRIAXONE <=1 SENSITIVE Sensitive     CIPROFLOXACIN <=0.25 SENSITIVE Sensitive     GENTAMICIN <=1 SENSITIVE Sensitive      IMIPENEM <=0.25 SENSITIVE Sensitive     NITROFURANTOIN 32 SENSITIVE Sensitive     TRIMETH/SULFA >=320 RESISTANT Resistant     AMPICILLIN/SULBACTAM 16 INTERMEDIATE Intermediate     PIP/TAZO <=4 SENSITIVE Sensitive     * >=100,000 COLONIES/mL ESCHERICHIA COLI  SARS Coronavirus 2 by RT PCR (hospital order, performed in [facility] hospital lab) Nasopharyngeal Nasopharyngeal Swab     Status: None   Collection Time: 03/02/20  2:31 PM   Specimen: Nasopharyngeal Swab  Result Value Ref Range Status   SARS Coronavirus 2 NEGATIVE NEGATIVE Final    Comment: (NOTE) SARS-CoV-2 target nucleic acids are NOT DETECTED. The SARS-CoV-2 RNA is generally detectable in upper and lower respiratory specimens during the acute phase of infection. The lowest concentration of SARS-CoV-2 viral copies this assay can detect  is 250 copies / mL. A negative result does not preclude SARS-CoV-2 infection and should not be used as the sole basis for treatment or other patient management decisions.  A negative result may occur with improper specimen collection / handling, submission of specimen other than nasopharyngeal swab, presence of viral mutation(s) within the areas targeted by this assay, and inadequate number of viral copies (<250 copies / mL). A negative result must be combined with clinical observations, patient history, and epidemiological information. Fact Sheet for Patients:   BoilerBrush.com.cy Fact Sheet for Healthcare Providers: https://pope.com/ This test is not yet approved or cleared  by the [place] FDA and has been authorized for detection and/or diagnosis of SARS-CoV-2 by FDA under an Emergency Use Authorization (EUA).  This EUA will remain in effect (meaning this test can be used) for the duration of the COVID-19 declaration under Section 564(b)(1) of the Act, 21 U.S.C. section 360bbb-3(b)(1), unless the authorization is terminated or revoked  sooner. Performed at [facility], [address] [place], [place] [address]   Blood Culture ID Panel (Reflexed)     Status: Abnormal   Collection Time: 03/02/20  2:31 PM  Result Value Ref Range Status   Enterococcus species NOT DETECTED NOT DETECTED Final   Listeria monocytogenes NOT DETECTED NOT DETECTED Final   Staphylococcus species NOT DETECTED NOT DETECTED Final   Staphylococcus aureus (BCID) NOT DETECTED NOT DETECTED Final   Streptococcus species DETECTED (A) NOT DETECTED Final    Comment: CRITICAL RESULT CALLED TO, READ BACK BY AND VERIFIED WITH: SCOTT HALL @ [phone] 03/03/2020 RH    Streptococcus agalactiae NOT DETECTED NOT DETECTED Final   Streptococcus pneumoniae NOT DETECTED NOT DETECTED Final   Streptococcus pyogenes DETECTED (A) NOT DETECTED Final    Comment: CRITICAL RESULT CALLED TO, READ BACK BY AND VERIFIED WITH: SCOTT HALL @ [phone] ON 03/03/2020 RH    Acinetobacter baumannii NOT DETECTED NOT DETECTED Final   Enterobacteriaceae species NOT DETECTED NOT DETECTED Final   Enterobacter cloacae complex NOT DETECTED NOT DETECTED Final   Escherichia coli NOT DETECTED NOT DETECTED Final   Klebsiella oxytoca NOT DETECTED NOT DETECTED Final   Klebsiella pneumoniae NOT DETECTED NOT DETECTED Final   Proteus species NOT DETECTED NOT DETECTED Final   Serratia marcescens NOT DETECTED NOT DETECTED Final   Haemophilus influenzae NOT DETECTED NOT DETECTED Final   Neisseria meningitidis NOT DETECTED NOT DETECTED Final   Pseudomonas aeruginosa NOT DETECTED NOT DETECTED Final   Candida albicans NOT DETECTED NOT DETECTED Final   Candida glabrata NOT DETECTED NOT DETECTED Final   Candida krusei NOT DETECTED NOT DETECTED Final   Candida parapsilosis NOT DETECTED NOT DETECTED Final   Candida tropicalis NOT DETECTED NOT DETECTED Final    Comment: Performed at [facility], [address] [place], [place] [address]  Aerobic Culture (superficial specimen)     Status: None  (Preliminary result)   Collection Time: 03/03/20  1:40 PM   Specimen: Wound  Result Value Ref Range Status   Specimen Description   Final    WOUND Performed at [facility], [address], [place], [place] [address]    Special Requests   Final    NONE Performed at [facility], [address] [place], [place] [address]    Gram Stain   Final    NO WBC SEEN RARE GRAM POSITIVE COCCI IN PAIRS Performed at [facility], [address] [place], [place] [address]    Culture PENDING  Incomplete   Report Status PENDING  Incomplete          IMAGING    No results found.   Nutrition Status:        BMP Latest Ref Rng & Units 03/03/2020 03/03/2020 03/03/2020  Glucose 70 - 99 mg/dL - - -  BUN 6 - 20 mg/dL - - -  Creatinine 0.35 - 1.00 mg/dL - - -  Sodium 597 - 416 mmol/L 143 142 149(H)  Potassium 3.5 - 5.1 mmol/L - - -  Chloride 98 - 111 mmol/L - - -  CO2 22 - 32 mmol/L - - -  Calcium 8.9 - 10.3 mg/dL - - -      Indwelling Urinary Catheter continued, requirement due to   Reason to continue Indwelling Urinary Catheter strict Intake/Output monitoring for hemodynamic instability   Central Line/ continued, requirement due to  Reason to continue [employer] of central venous pressure or other hemodynamic parameters and poor IV access      ASSESSMENT AND PLAN SYNOPSIS  Severe Sepsis/SEPTIC SHOCK due  secondary to UTI & aspiration Pneumonia Sacral & left Heel decubitus ulcers, wound to right posterior leg inferior to gluteus muscle>> doesn't appear grossly infected -use vasopressors to keep MAP>55 -follow ABG and LA -follow up cultures - stress dose steroids -aggressive IV fluid Resuscitation   ACUTE SYSTOLIC CARDIAC FAILURE-  -oxygen as needed   NEUROLOGY Encephalopathy from sepsis   CARDIAC ICU monitoring  ID -continue IV abx as prescibed   GI GI PROPHYLAXIS as indicated  NUTRITIONAL STATUS Nutrition  Status:   DIET-->NPO Constipation protocol as indicated  ENDO - will use ICU hypoglycemic\Hyperglycemia protocol if indicated   ELECTROLYTES -follow labs as needed -replace as needed -pharmacy consultation and following   DVT/GI PRX ordered TRANSFUSIONS AS NEEDED MONITOR FSBS ASSESS the need for LABS as needed   Critical Care Time devoted to patient care services described in this note is 32 minutes.   Overall, patient is critically ill, prognosis is guarded.  Patient with Multiorgan failure and at high risk for cardiac arrest and death.    Lucie Leather, M.D.  [facility]  Medical Director [facility] Medical Director [facility] Cardio-Pulmonary Department

## 2020-03-05 LAB — CBC
HCT: 29.1 % — ABNORMAL LOW (ref 36.0–46.0)
Hemoglobin: 9.5 g/dL — ABNORMAL LOW (ref 12.0–15.0)
MCH: 27.4 pg (ref 26.0–34.0)
MCHC: 32.6 g/dL (ref 30.0–36.0)
MCV: 83.9 fL (ref 80.0–100.0)
Platelets: 145 10*3/uL — ABNORMAL LOW (ref 150–400)
RBC: 3.47 MIL/uL — ABNORMAL LOW (ref 3.87–5.11)
RDW: 13.7 % (ref 11.5–15.5)
WBC: 18.1 10*3/uL — ABNORMAL HIGH (ref 4.0–10.5)
nRBC: 0.2 % (ref 0.0–0.2)

## 2020-03-05 LAB — CULTURE, BLOOD (ROUTINE X 2): Special Requests: ADEQUATE

## 2020-03-05 LAB — BASIC METABOLIC PANEL
Anion gap: 9 (ref 5–15)
BUN: 19 mg/dL (ref 6–20)
CO2: 26 mmol/L (ref 22–32)
Calcium: 7.1 mg/dL — ABNORMAL LOW (ref 8.9–10.3)
Chloride: 94 mmol/L — ABNORMAL LOW (ref 98–111)
Creatinine, Ser: 0.45 mg/dL (ref 0.44–1.00)
GFR calc Af Amer: >60 mL/min (ref >60)
GFR calc non Af Amer: >60 mL/min (ref >60)
Glucose, Bld: 99 mg/dL (ref 70–99)
Potassium: 3.3 mmol/L — ABNORMAL LOW (ref 3.5–5.1)
Sodium: 129 mmol/L — ABNORMAL LOW (ref 135–145)

## 2020-03-05 LAB — PHOSPHORUS
Phosphorus: 2.1 mg/dL — ABNORMAL LOW (ref 2.5–4.6)
Phosphorus: 2.4 mg/dL — ABNORMAL LOW (ref 2.5–4.6)

## 2020-03-05 LAB — GLUCOSE, CAPILLARY
Glucose-Capillary: 92 mg/dL (ref 70–99)
Glucose-Capillary: 102 mg/dL — ABNORMAL HIGH (ref 70–99)
Glucose-Capillary: 125 mg/dL — ABNORMAL HIGH (ref 70–99)
Glucose-Capillary: 108 mg/dL — ABNORMAL HIGH (ref 70–99)
Glucose-Capillary: 187 mg/dL — ABNORMAL HIGH (ref 70–99)

## 2020-03-05 LAB — MAGNESIUM
Magnesium: 1.4 mg/dL — ABNORMAL LOW (ref 1.7–2.4)
Magnesium: 1.8 mg/dL (ref 1.7–2.4)

## 2020-03-05 LAB — POTASSIUM: Potassium: 3 mmol/L — ABNORMAL LOW (ref 3.5–5.1)

## 2020-03-05 MED ORDER — LACTATED RINGERS IV SOLN
INTRAVENOUS | Status: DC
  Administered 2020-03-05: 50 mL/h via INTRAVENOUS

## 2020-03-05 MED ORDER — POTASSIUM CHLORIDE 10 MEQ/100ML IV SOLN
10.0000 meq | INTRAVENOUS | Status: AC
  Administered 2020-03-05 (×4): 10 meq via INTRAVENOUS
  Filled 2020-03-05 (×4): qty 100

## 2020-03-05 MED ORDER — POTASSIUM PHOSPHATES 15 MMOLE/5ML IV SOLN
10.0000 mmol | Freq: Once | INTRAVENOUS | Status: AC
  Administered 2020-03-05: 10 mmol via INTRAVENOUS
  Filled 2020-03-05: qty 3.33

## 2020-03-05 MED ORDER — FLUCONAZOLE 100MG IVPB
100.0000 mg | INTRAVENOUS | Status: AC
  Administered 2020-03-05 – 2020-03-06 (×2): 100 mg via INTRAVENOUS
  Filled 2020-03-05 (×2): qty 50

## 2020-03-05 MED ORDER — MAGNESIUM SULFATE 2 GM/50ML IV SOLN: 2.0000 g | Freq: Once | INTRAVENOUS | Status: AC

## 2020-03-05 MED ORDER — SODIUM CHLORIDE 0.9 % IV SOLN
INTRAVENOUS | Status: DC
  Administered 2020-03-05: 50 mL/h via INTRAVENOUS

## 2020-03-05 MED ORDER — MAGNESIUM SULFATE 2 GM/50ML IV SOLN
INTRAVENOUS | Status: AC
  Administered 2020-03-05: 2 g via INTRAVENOUS
  Filled 2020-03-05: qty 50

## 2020-03-05 MED ORDER — POTASSIUM PHOSPHATES 15 MMOLE/5ML IV SOLN
10.0000 mmol | Freq: Once | INTRAVENOUS | Status: AC
  Administered 2020-03-06: 10 mmol via INTRAVENOUS
  Filled 2020-03-05: qty 3.33

## 2020-03-05 NOTE — Progress Notes (Signed)
Patient off speaking valve while sleeping. Placed her on 28% venturi trach mask at 3liters. Back to nasal cannula when using speaking device.

## 2020-03-05 NOTE — Progress Notes (Signed)
Palliative: [name], [name], is resting quietly in bed.  She is sleeping soundly, but wakes when I shake her hand.  She will make an somewhat keep eye contact.  She is able to make her basic needs known. There is no family at bedside at this time.   I ask if she has been eating, and she says that she has.    Tewana remains on 2 vasopressors at this time with blood pressure 60s-100's / 30s-80's.  Heart rate 60s to 90s.  Her white blood cells remain high at 18.  She remains critically ill.  Joanne [name] [name] and I talked about life support.  I ask if she has considered her choices.  She tells me that at this point she would want full measures of life support.  I reassure her that we are doing what we can to care for her.  Call to aunt/HC POA, Joanne [name] [name].  Somewhat generic voicemail message left.  Conference with attending, bedside nursing staff, transition of care team related to patient condition, needs, goals of care.  Plan:   At this point, full scope/full code.  Patient and legal guardian are open to all treatments, including chest compressions and intubation. Prognosis: In hospital death would not be surprising.  40 minutes Joanne [name] Carmel, NP  Palliative Medicine Team Team Phone # [phone] Greater than 50% of this time was spent counseling and coordinating care related to the above assessment and plan.

## 2020-03-05 NOTE — Progress Notes (Signed)
ID LDA 5/12 Triple-lumen central venous catheter right femoral vein 5/12 Arterial line right femoral artery  Patient Vitals for the past 24 hrs:  BP Temp Temp src Pulse Resp SpO2 Weight  03/05/20 1115 106/85 - - 81 (!) 48 96 % -  03/05/20 1100 109/86 - - 82 20 95 % -  03/05/20 1045 102/85 - - 93 17 94 % -  03/05/20 1030 103/88 - - 78 (!) 42 94 % -  03/05/20 1015 99/80 - - 80 19 92 % -  03/05/20 1000 101/80 - - 77 20 93 % -  03/05/20 0945 99/68 - - 75 (!) 21 95 % -  03/05/20 0930 107/84 - - 76 (!) 22 96 % -  03/05/20 0915 104/81 - - 75 16 95 % -  03/05/20 0900 106/84 - - 79 18 95 % -  03/05/20 0845 106/83 - - 79 (!) 27 95 % -  03/05/20 0825 - - - - - 96 % -  03/05/20 0815 92/71 - - 77 (!) 22 95 % -  03/05/20 0800 91/74 (!) 97.5 F (36.4 C) Axillary 66 (!) 28 96 % -  03/05/20 0745 (!) 69/61 - - 74 12 96 % -  03/05/20 0700 (!) 74/55 - - 79 (!) 23 96 % -  03/05/20 0500 - - - - - - 69.6 kg  03/05/20 0400 - (!) 97.4 F (36.3 C) Axillary - - - -  03/05/20 0000 - (!) 97.3 F (36.3 C) Axillary - - - -  03/04/20 2200 - (!) 97.4 F (36.3 C) Axillary - - - -  03/04/20 2000 - - - - - 96 % -  03/04/20 1800 100/83 - - 86 19 99 % -  03/04/20 1700 (!) 88/73 - - 67 (!) 23 100 % -  03/04/20 1600 90/77 98 F (36.7 C) Axillary 86 (!) 25 100 % -  03/04/20 1500 (!) 81/68 - - 79 (!) 26 96 % -  03/04/20 1400 (!) 84/72 - - - 18 - -  03/04/20 1300 - - - - (!) 30 - -   Pt more awake Trying to respond to commands Tracheostomy Chest b/l air entry [id number] Sacral wounds not seen today  CBC Latest Ref Rng & Units 03/05/2020 03/04/2020 03/03/2020  WBC 4.0 - 10.5 K/uL 18.1(H) 35.6(H) 25.9(H)  Hemoglobin 12.0 - 15.0 g/dL 9.5(L) 10.6(L) 10.1(L)  Hematocrit 36.0 - 46.0 % 29.1(L) 32.4(L) 33.4(L)  Platelets 150 - 400 K/uL 145(L) 167 162     CMP Latest Ref Rng & Units 03/05/2020 03/04/2020 03/03/2020  Glucose 70 - 99 mg/dL 99 238(H) -  BUN 6 - 20 mg/dL 19 31(H) -  Creatinine 0.44 - 1.00 mg/dL 0.45 0.62 -   Sodium 135 - 145 mmol/L 129(L) 132(L) 143  Potassium 3.5 - 5.1 mmol/L 3.3(L) 4.1 -  Chloride 98 - 111 mmol/L 94(L) 100 -  CO2 22 - 32 mmol/L 26 23 -  Calcium 8.9 - 10.3 mg/dL 7.1(L) 7.2(L) -  Total Protein 6.5 - 8.1 g/dL - - -  Total Bilirubin 0.3 - 1.2 mg/dL - - -  Alkaline Phos 38 - 126 U/L - - -  AST 15 - 41 U/L - - -  ALT 0 - 44 U/L - - -     Microbiology Blood culture 03/02/2020 Group A streptococcus 4 out of 4 E. coli 2 out of 4  03/03/2020 sacral wound ulcer still pending   Impression/recommendation Group B streptococcus bacteremia with septic shock  E. coli bacteremia E. coli UTI Sacral ulcer Currently on Zosyn and clindamycin We will de-escalate Zosyn to ceftriaxone once the cultures finalize and continue clindamycin .   Septic shock on IV fluids and vasopressors and stress dose steroids  Hyponatremia  AKI due to sepsis resolved. Leukocytosis combination of infection and stress dose steroids.  Improving  History of traumatic brain injury with tracheostomy  Discussed the management with the care team

## 2020-03-05 NOTE — Progress Notes (Signed)
Pharmacy Antibiotic Note  Joanne [name] is a 49 y.o. female admitted on 03/02/2020 with sepsis.  Pharmacy has been consulted for Zosyn dosing. Patient with group A streptococcus in blood cultures and now with E.coli in one set of blood cultures. She is on vasopressors.  Has multiple wounds. Urine culture with E.coli and proteus mirabilis.   Plan: Patient is currently on Zosyn 3.375 g IV q8h extended infusion and clindamycin 600 mg IV q8h. ID is following the patient. Will continue to follow recommendations.     Height: 5\' 7"  (170.2 cm) Weight: 69.6 kg (153 lb 7 oz) IBW/kg (Calculated) : 61.6  Temp (24hrs), Avg:97.5 F (36.4 C), Min:97.3 F (36.3 C), Max:98 F (36.7 C)  Recent Labs  Lab 03/02/20 1430 03/02/20 1431 03/03/20 0051 03/03/20 0547 03/04/20 0533 03/05/20 0505  WBC  --  30.0*  --  25.9* 35.6* 18.1*  CREATININE  --  1.85*  --  1.06* 0.62 0.45  LATICACIDVEN 2.3*  --  2.2*  --   --   --     Estimated Creatinine Clearance: 81.8 mL/min (by C-G formula based on SCr of 0.45 mg/dL).    Allergies  Allergen Reactions  . Kaopectate  [Attapulgite] Itching  . Thiopental Itching  . Tomato     Antimicrobials this admission: 5/11 vanco/cefepime/metronidazole x 1 5/11 pip/tazo >> 5/12 5/12 PCN G >> 5/13 5/12 clinda >> 5/13 pip/tazo >>   Microbiology results: 5/11 Bcx: GPC (4/4 bottles) - BCID = Grp A strep; 1 set E.coli 5/11 Ucx: E. Coli (R to amp, TMP/SMZ, Intermed Amp/sulb), proteus mirabilis 5/12 Wound: proteus mirabilis  Thank you for allowing pharmacy to be a part of this patient's care.  7/12, PharmD 03/05/2020 3:28 PM

## 2020-03-05 NOTE — Progress Notes (Signed)
PHARMACY CONSULT NOTE  Pharmacy Consult for Electrolyte Monitoring and Replacement   Recent Labs: Potassium (mmol/L)  Date Value  03/05/2020 3.3 (L)   Magnesium (mg/dL)  Date Value  54/96/5659 1.4 (L)   Calcium (mg/dL)  Date Value  94/37/1907 7.1 (L)   Albumin (g/dL)  Date Value  05/13/7115 2.3 (L)   Phosphorus (mg/dL)  Date Value  54/61/2432 2.1 (L)   Sodium (mmol/L)  Date Value  03/05/2020 129 (L)     Assessment: 49 year old female presented with altered mental status. Patient with h/o TBI with tracheostomy. Patient with strep pyogenes bacteremia. She has sacral and left heel decubitus ulcers as well as a wound to the right posterior leg. Pharmacy to manage electrolytes. Electrolytes low, concerning for refeeding.  Goal of Therapy:  Electrolytes WNL  Plan:  Orders for magnesium 2 g IV x 1, potassium 10 mEq IV x 4 and potassium phos 10 mmol IV x 1. Will f/u electrolytes with morning labs.  Pricilla Riffle ,PharmD Clinical Pharmacist 03/05/2020 2:59 PM

## 2020-03-05 NOTE — Progress Notes (Signed)
Pt is sitting up in bed eating supper at this time. She has been more alert and oriented today. Vasopressin and dopamine is still infusing to keep MAP >55 per Dr. Belia Heman. VSS. Pt had visit from her pastor today.

## 2020-03-05 NOTE — Progress Notes (Signed)
CRITICAL CARE NOTE 49 YO WITH  STREP PYOGENES BACTEREMIA FROM SACRAL WOUNDS WITH TBI S/P TRACH   CC  follow up septic shock  SUBJECTIVE Patient remains critically ill Prognosis is guarded Multiple vasopressors   BP (!) 74/55   Pulse 79   Temp (!) 97.4 F (36.3 C) (Axillary)   Resp (!) 23   Ht 5\' 7"  (1.702 m)   Wt 69.6 kg   LMP  (LMP Unknown)   SpO2 96%   BMI 24.03 kg/m    I/O last 3 completed shifts: In: 2869.2 [I.V.:1977.9; IV Piggyback:891.3] Out: 3050 [Urine:3050] No intake/output data recorded.  SpO2: 96 % O2 Flow Rate (L/min): 3 L/min FiO2 (%): 28 %(venturi)  Estimated body mass index is 24.03 kg/m as calculated from the following:   Height as of this encounter: 5\' 7"  (1.702 m).   Weight as of this encounter: 69.6 kg.  SIGNIFICANT EVENTS   REVIEW OF SYSTEMS  PATIENT IS UNABLE TO PROVIDE COMPLETE REVIEW OF SYSTEMS DUE TO SEVERE CRITICAL ILLNESS   Pressure Injury 11/15/19 Coccyx Stage 3 -  Full thickness tissue loss. Subcutaneous fat may be visible but bone, tendon or muscle are NOT exposed. red;bleeding (Active)  11/15/19 1500  Location: Coccyx  Location Orientation:   Staging: Stage 3 -  Full thickness tissue loss. Subcutaneous fat may be visible but bone, tendon or muscle are NOT exposed.  Wound Description (Comments): red;bleeding  Present on Admission: Yes     Pressure Injury 03/02/20 Thigh Posterior;Proximal;Right Stage 2 -  Partial thickness loss of dermis presenting as a shallow open injury with a red, pink wound bed without slough. (Active)  03/02/20 2100  Location: Thigh  Location Orientation: Posterior;Proximal;Right  Staging: Stage 2 -  Partial thickness loss of dermis presenting as a shallow open injury with a red, pink wound bed without slough.  Wound Description (Comments):   Present on Admission: Yes     Pressure Injury 03/02/20 Heel Left Unstageable - Full thickness tissue loss in which the base of the injury is covered by slough  (yellow, tan, gray, green or brown) and/or eschar (tan, brown or black) in the wound bed. (Active)  03/02/20 2100  Location: Heel  Location Orientation: Left  Staging: Unstageable - Full thickness tissue loss in which the base of the injury is covered by slough (yellow, tan, gray, green or brown) and/or eschar (tan, brown or black) in the wound bed.  Wound Description (Comments):   Present on Admission: Yes      PHYSICAL EXAMINATION:  GENERAL:critically ill appearing,  HEAD: Normocephalic, atraumatic.  EYES: Pupils equal, round, reactive to light.  No scleral icterus.  MOUTH: Moist mucosal membrane. NECK: Supple.  PULMONARY: +rhonchi, +wheezing CARDIOVASCULAR: S1 and S2. Regular rate and rhythm. No murmurs, rubs, or gallops.  GASTROINTESTINAL: Soft, nontender, -distended.  Positive bowel sounds.   MUSCULOSKELETAL:sacral decubs NEUROLOGIC: obtunded, GCS<8 SKIN:intact,warm,dry  MEDICATIONS: I have reviewed all medications and confirmed regimen as documented   CULTURE RESULTS   Recent Results (from the past 240 hour(s))  Blood Culture (routine x 2)     Status: Abnormal (Preliminary result)   Collection Time: 03/02/20  2:31 PM   Specimen: BLOOD  Result Value Ref Range Status   Specimen Description   Final    BLOOD BLOOD RIGHT HAND Performed at [facility], [address], [place], [address] [place]    Special Requests   Final    BOTTLES DRAWN AEROBIC AND ANAEROBIC Blood Culture adequate volume Performed at [facility], [place], [place] [address]    Culture  Setup Time   Final    GRAM POSITIVE COCCI IN BOTH AEROBIC AND ANAEROBIC BOTTLES CRITICAL VALUE NOTED.  VALUE IS CONSISTENT WITH PREVIOUSLY REPORTED AND CALLED VALUE. Performed at [facility], [address] [place], [place] [address]    Culture (A)  Final    GROUP A STREP (S.PYOGENES) ISOLATED ESCHERICHIA COLI CRITICAL RESULT CALLED TO, READ BACK BY AND VERIFIED  WITH: Meyer Russel, AT [phone] 03/04/20 BY [facility] Performed at [facility], [place] [address], [place], [place] [address]    Report Status PENDING  Incomplete  Blood Culture (routine x 2)     Status: Abnormal (Preliminary result)   Collection Time: 03/02/20  2:31 PM   Specimen: BLOOD  Result Value Ref Range Status   Specimen Description   Final    BLOOD Blood Culture results may not be optimal due to an inadequate volume of blood received in culture bottles Performed at [facility], [address] [place], [place] [address]    Special Requests   Final    BOTTLES DRAWN AEROBIC AND ANAEROBIC BLOOD LEFT HAND Performed at [facility], [address] [place], [place] [address]    Culture  Setup Time   Final    GRAM POSITIVE COCCI CRITICAL RESULT CALLED TO, READ BACK BY AND VERIFIED WITH: SCOTT HALL @ [phone] 03/03/2020 RH IN BOTH AEROBIC AND ANAEROBIC BOTTLES Performed at [facility], [place] [address], [place], [place] [address]    Culture GROUP A STREP (S.PYOGENES) ISOLATED (A)  Final   Report Status PENDING  Incomplete  Urine culture     Status: Abnormal (Preliminary result)   Collection Time: 03/02/20  2:31 PM   Specimen: In/Out Cath Urine  Result Value Ref Range Status   Specimen Description   Final    IN/OUT CATH URINE Performed at [facility], [address], [place], [place] [address]    Special Requests   Final    NONE Performed at [facility], [address] [place], [place] [address]    Culture (A)  Final    >=100,000 COLONIES/mL ESCHERICHIA COLI 60,000 COLONIES/mL PROTEUS MIRABILIS CULTURE REINCUBATED FOR BETTER GROWTH Performed at [facility], [place] [address], [place],  [address]    Report Status PENDING  Incomplete   Organism ID, Bacteria ESCHERICHIA COLI (A)  Final      Susceptibility   Escherichia coli - MIC*    AMPICILLIN >=32 RESISTANT Resistant     CEFAZOLIN <=4 SENSITIVE Sensitive      CEFTRIAXONE <=1 SENSITIVE Sensitive     CIPROFLOXACIN <=0.25 SENSITIVE Sensitive     GENTAMICIN <=1 SENSITIVE Sensitive     IMIPENEM <=0.25 SENSITIVE Sensitive     NITROFURANTOIN 32 SENSITIVE Sensitive     TRIMETH/SULFA >=320 RESISTANT Resistant     AMPICILLIN/SULBACTAM 16 INTERMEDIATE Intermediate     PIP/TAZO <=4 SENSITIVE Sensitive     * >=100,000 COLONIES/mL ESCHERICHIA COLI  SARS Coronavirus 2 by RT PCR (hospital order, performed in [facility] hospital lab) Nasopharyngeal Nasopharyngeal Swab     Status: None   Collection Time: 03/02/20  2:31 PM   Specimen: Nasopharyngeal Swab  Result Value Ref Range Status   SARS Coronavirus 2 NEGATIVE NEGATIVE Final    Comment: (NOTE) SARS-CoV-2 target nucleic acids are NOT DETECTED. The SARS-CoV-2 RNA is generally detectable in upper and lower respiratory specimens during the acute  phase of infection. The lowest concentration of SARS-CoV-2 viral copies this assay can detect is 250 copies / mL. A negative result does not preclude SARS-CoV-2 infection and should not be used as the sole basis for treatment or other patient management decisions.  A negative result may occur with improper specimen collection / handling, submission of specimen other than nasopharyngeal swab, presence of viral mutation(s) within the areas targeted by this assay, and inadequate number of viral copies (<250 copies / mL). A negative result must be combined with clinical observations, patient history, and epidemiological information. Fact Sheet for Patients:   BoilerBrush.com.cy Fact Sheet for Healthcare Providers: https://pope.com/ This test is not yet approved or cleared  by the [place] FDA and has been authorized for detection and/or diagnosis of SARS-CoV-2 by FDA under an Emergency Use Authorization (EUA).  This EUA will remain in effect (meaning this test can be used) for the duration of the COVID-19  declaration under Section 564(b)(1) of the Act, 21 U.S.C. section 360bbb-3(b)(1), unless the authorization is terminated or revoked sooner. Performed at [facility], [address] [place], [place] [address]   Blood Culture ID Panel (Reflexed)     Status: Abnormal   Collection Time: 03/02/20  2:31 PM  Result Value Ref Range Status   Enterococcus species NOT DETECTED NOT DETECTED Final   Listeria monocytogenes NOT DETECTED NOT DETECTED Final   Staphylococcus species NOT DETECTED NOT DETECTED Final   Staphylococcus aureus (BCID) NOT DETECTED NOT DETECTED Final   Streptococcus species DETECTED (A) NOT DETECTED Final    Comment: CRITICAL RESULT CALLED TO, READ BACK BY AND VERIFIED WITH: SCOTT HALL @ [phone] 03/03/2020 RH    Streptococcus agalactiae NOT DETECTED NOT DETECTED Final   Streptococcus pneumoniae NOT DETECTED NOT DETECTED Final   Streptococcus pyogenes DETECTED (A) NOT DETECTED Final    Comment: CRITICAL RESULT CALLED TO, READ BACK BY AND VERIFIED WITH: SCOTT HALL @ [phone] ON 03/03/2020 RH    Acinetobacter baumannii NOT DETECTED NOT DETECTED Final   Enterobacteriaceae species NOT DETECTED NOT DETECTED Final   Enterobacter cloacae complex NOT DETECTED NOT DETECTED Final   Escherichia coli NOT DETECTED NOT DETECTED Final   Klebsiella oxytoca NOT DETECTED NOT DETECTED Final   Klebsiella pneumoniae NOT DETECTED NOT DETECTED Final   Proteus species NOT DETECTED NOT DETECTED Final   Serratia marcescens NOT DETECTED NOT DETECTED Final   Haemophilus influenzae NOT DETECTED NOT DETECTED Final   Neisseria meningitidis NOT DETECTED NOT DETECTED Final   Pseudomonas aeruginosa NOT DETECTED NOT DETECTED Final   Candida albicans NOT DETECTED NOT DETECTED Final   Candida glabrata NOT DETECTED NOT DETECTED Final   Candida krusei NOT DETECTED NOT DETECTED Final   Candida parapsilosis NOT DETECTED NOT DETECTED Final   Candida tropicalis NOT DETECTED NOT DETECTED Final    Comment:  Performed at [facility], [address] [place], [place] [address]  Aerobic Culture (superficial specimen)     Status: None (Preliminary result)   Collection Time: 03/03/20  1:40 PM   Specimen: Wound  Result Value Ref Range Status   Specimen Description   Final    WOUND Performed at [facility], [address] [place], [place] [address]    Special Requests   Final    NONE Performed at [facility], [address] [place], [place] [address]    Gram Stain NO WBC SEEN RARE GRAM POSITIVE COCCI IN PAIRS   Final   Culture   Final  FEW GRAM NEGATIVE RODS CULTURE REINCUBATED FOR BETTER GROWTH Performed at [facility], [address], [place], [place] [address]    Report Status PENDING  Incomplete        BMP Latest Ref Rng & Units 03/05/2020 03/04/2020 03/03/2020  Glucose 70 - 99 mg/dL 99 814(G) -  BUN 6 - 20 mg/dL 19 81(E) -  Creatinine 0.44 - 1.00 mg/dL 5.63 1.49 -  Sodium 702 - 145 mmol/L 129(L) 132(L) 143  Potassium 3.5 - 5.1 mmol/L 3.3(L) 4.1 -  Chloride 98 - 111 mmol/L 94(L) 100 -  CO2 22 - 32 mmol/L 26 23 -  Calcium 8.9 - 10.3 mg/dL 7.1(L) 7.2(L) -      IMAGING    No results found.   Nutrition Status: Nutrition Problem: Increased nutrient needs Etiology: wound healing Signs/Symptoms: estimated needs Interventions: Refer to RD note for recommendations     Indwelling Urinary Catheter continued, requirement due to   Reason to continue Indwelling Urinary Catheter strict Intake/Output monitoring for hemodynamic instability   Central Line/ continued, requirement due to  Reason to continue [employer] Monitoring of central venous pressure or other hemodynamic parameters and poor IV access      ASSESSMENT AND PLAN SYNOPSIS  Severe Sepsis/SEPTIC SHOCK due  secondary to UTI & aspiration Pneumonia Sacral & left Heel decubitus ulcers, wound to right posterior leg inferior to gluteus muscle +STREP A PYOGENES  BACTEREMIA +GRAM NEG BACTEREMIA      Acute toxic metabolic encephalopathy, need for sedation   SHOCK-SEPSIS -use vasopressors to keep MAP>65 -follow ABG and LA -follow up cultures -emperic ABX stress dose steroids -aggressive IV fluid resuscitation  CARDIAC ICU monitoring  ID -continue IV abx as prescibed -follow up cultures  GI GI PROPHYLAXIS as indicated  NUTRITIONAL STATUS Nutrition Status: Nutrition Problem: Increased nutrient needs Etiology: wound healing Signs/Symptoms: estimated needs Interventions: Refer to RD note for recommendations   DIET--> as tolerated Constipation protocol as indicated  ENDO - will use ICU hypoglycemic\Hyperglycemia protocol if indicated     ELECTROLYTES -follow labs as needed -replace as needed -pharmacy consultation and following   DVT/GI PRX ordered and assessed TRANSFUSIONS AS NEEDED MONITOR FSBS I Assessed the need for Labs I Assessed the need for Foley I Assessed the need for Central Venous Line Family Discussion when available I Assessed the need for Mobilization I made an Assessment of medications to be adjusted accordingly Safety Risk assessment completed   CASE DISCUSSED IN MULTIDISCIPLINARY ROUNDS WITH ICU TEAM  Critical Care Time devoted to patient care services described in this note is 32 minutes.   Overall, patient is critically ill, prognosis is guarded.  Patient with Multiorgan failure and at high risk for cardiac arrest and death.    Lucie Leather, M.D.  [facility]  Medical Director [facility] Medical Director [facility] Cardio-Pulmonary Department

## 2020-03-06 DIAGNOSIS — Z515 Encounter for palliative care: Secondary | ICD-10-CM

## 2020-03-06 LAB — GLUCOSE, CAPILLARY
Glucose-Capillary: 114 mg/dL — ABNORMAL HIGH (ref 70–99)
Glucose-Capillary: 125 mg/dL — ABNORMAL HIGH (ref 70–99)
Glucose-Capillary: 133 mg/dL — ABNORMAL HIGH (ref 70–99)
Glucose-Capillary: 143 mg/dL — ABNORMAL HIGH (ref 70–99)
Glucose-Capillary: 67 mg/dL — ABNORMAL LOW (ref 70–99)
Glucose-Capillary: 94 mg/dL (ref 70–99)
Glucose-Capillary: 97 mg/dL (ref 70–99)

## 2020-03-06 LAB — BASIC METABOLIC PANEL
Anion gap: 7 (ref 5–15)
Anion gap: 7 (ref 5–15)
BUN: 13 mg/dL (ref 6–20)
BUN: 13 mg/dL (ref 6–20)
CO2: 23 mmol/L (ref 22–32)
CO2: 23 mmol/L (ref 22–32)
Calcium: 5.9 mg/dL — CL (ref 8.9–10.3)
Calcium: 6.9 mg/dL — ABNORMAL LOW (ref 8.9–10.3)
Chloride: 98 mmol/L (ref 98–111)
Chloride: 95 mmol/L — ABNORMAL LOW (ref 98–111)
Creatinine, Ser: 0.46 mg/dL (ref 0.44–1.00)
Creatinine, Ser: 0.52 mg/dL (ref 0.44–1.00)
GFR calc Af Amer: >60 mL/min (ref >60)
GFR calc Af Amer: >60 mL/min (ref >60)
GFR calc non Af Amer: >60 mL/min (ref >60)
GFR calc non Af Amer: >60 mL/min (ref >60)
Glucose, Bld: 102 mg/dL — ABNORMAL HIGH (ref 70–99)
Glucose, Bld: 168 mg/dL — ABNORMAL HIGH (ref 70–99)
Potassium: 2.5 mmol/L — CL (ref 3.5–5.1)
Potassium: 3.7 mmol/L (ref 3.5–5.1)
Sodium: 128 mmol/L — ABNORMAL LOW (ref 135–145)
Sodium: 125 mmol/L — ABNORMAL LOW (ref 135–145)

## 2020-03-06 LAB — CBC WITH DIFFERENTIAL/PLATELET
Abs Immature Granulocytes: 0.23 10*3/uL — ABNORMAL HIGH (ref 0.00–0.07)
Basophils Absolute: 0 10*3/uL (ref 0.0–0.1)
Basophils Relative: 0 %
Eosinophils Absolute: 0 10*3/uL (ref 0.0–0.5)
Eosinophils Relative: 0 %
HCT: 24.5 % — ABNORMAL LOW (ref 36.0–46.0)
Hemoglobin: 8.4 g/dL — ABNORMAL LOW (ref 12.0–15.0)
Immature Granulocytes: 2 %
Lymphocytes Relative: 9 %
Lymphs Abs: 1.4 10*3/uL (ref 0.7–4.0)
MCH: 27.6 pg (ref 26.0–34.0)
MCHC: 34.3 g/dL (ref 30.0–36.0)
MCV: 80.6 fL (ref 80.0–100.0)
Monocytes Absolute: 0.6 10*3/uL (ref 0.1–1.0)
Monocytes Relative: 4 %
Neutro Abs: 12.6 10*3/uL — ABNORMAL HIGH (ref 1.7–7.7)
Neutrophils Relative %: 85 %
Platelets: 130 10*3/uL — ABNORMAL LOW (ref 150–400)
RBC: 3.04 MIL/uL — ABNORMAL LOW (ref 3.87–5.11)
RDW: 13.4 % (ref 11.5–15.5)
WBC: 14.9 10*3/uL — ABNORMAL HIGH (ref 4.0–10.5)
nRBC: 0.2 % (ref 0.0–0.2)

## 2020-03-06 LAB — AEROBIC CULTURE W GRAM STAIN (SUPERFICIAL SPECIMEN): Gram Stain: NONE SEEN

## 2020-03-06 LAB — URINE CULTURE: Culture: >=100000 — AB

## 2020-03-06 LAB — MAGNESIUM
Magnesium: 1.5 mg/dL — ABNORMAL LOW (ref 1.7–2.4)
Magnesium: 2.6 mg/dL — ABNORMAL HIGH (ref 1.7–2.4)

## 2020-03-06 LAB — POTASSIUM: Potassium: 3.4 mmol/L — ABNORMAL LOW (ref 3.5–5.1)

## 2020-03-06 LAB — PHOSPHORUS: Phosphorus: 2.5 mg/dL (ref 2.5–4.6)

## 2020-03-06 MED ORDER — LACTATED RINGERS IV BOLUS
1000.0000 mL | Freq: Once | INTRAVENOUS | Status: AC
  Administered 2020-03-06: 1000 mL via INTRAVENOUS

## 2020-03-06 MED ORDER — CALCIUM GLUCONATE-NACL 1-0.675 GM/50ML-% IV SOLN
1.0000 g | Freq: Once | INTRAVENOUS | Status: AC
  Administered 2020-03-06: 1000 mg via INTRAVENOUS
  Filled 2020-03-06: qty 50

## 2020-03-06 MED ORDER — POTASSIUM CHLORIDE IN NACL 40-0.9 MEQ/L-% IV SOLN
INTRAVENOUS | Status: DC
  Administered 2020-03-06: 50 mL/h via INTRAVENOUS
  Filled 2020-03-06: qty 1000

## 2020-03-06 MED ORDER — POTASSIUM CHLORIDE 10 MEQ/50ML IV SOLN
10.0000 meq | INTRAVENOUS | Status: AC
  Administered 2020-03-06 (×5): 10 meq via INTRAVENOUS
  Filled 2020-03-06 (×5): qty 50

## 2020-03-06 MED ORDER — MAGNESIUM SULFATE 4 GM/100ML IV SOLN
4.0000 g | Freq: Once | INTRAVENOUS | Status: AC
  Administered 2020-03-06: 4 g via INTRAVENOUS
  Filled 2020-03-06: qty 100

## 2020-03-06 NOTE — Progress Notes (Signed)
Lab results show down trending sodium despite receiving IV fluids, reported results to MD, received verbal orders to D/C fluids.  Also received order to recheck potassium at 1800.  Will continue to monitor.

## 2020-03-06 NOTE — Progress Notes (Signed)
CRITICAL CARE NOTE 49 YO WITH  STREP PYOGENES BACTEREMIA FROM SACRAL WOUNDS WITH TBI S/P [facility]  5/11 -5/15  Admitted FOR SEPTIC SHOCK +STREP PYOGENES AND E COLI BACTEREMIA ID CONSULTATION MULTIPLE VASOPRESSORS GEN SURGERY CONSULTATION  5/15 more alert and awake, FULL CODE  CC  Septic shock  SUBJECTIVE On pressors Prognosis is poor More alert and awake today   BP (!) 87/67   Pulse 65   Temp 98 F (36.7 C)   Resp 20   Ht 5\' 7"  (1.702 m)   Wt 70 kg   LMP  (LMP Unknown)   SpO2 99%   BMI 24.17 kg/m    I/O last 3 completed shifts: In: 3255.6 [I.V.:1783.8; IV Piggyback:1471.8] Out: 1400 [Urine:1400] No intake/output data recorded.  SpO2: 99 % O2 Flow Rate (L/min): 2 L/min FiO2 (%): 28 %  Estimated body mass index is 24.17 kg/m as calculated from the following:   Height as of this encounter: 5\' 7"  (1.702 m).   Weight as of this encounter: 70 kg. LIMITED ROS   Pressure Injury 11/15/19 Coccyx Stage 3 -  Full thickness tissue loss. Subcutaneous fat may be visible but bone, tendon or muscle are NOT exposed. red;bleeding (Active)  11/15/19 1500  Location: Coccyx  Location Orientation:   Staging: Stage 3 -  Full thickness tissue loss. Subcutaneous fat may be visible but bone, tendon or muscle are NOT exposed.  Wound Description (Comments): red;bleeding  Present on Admission: Yes     Pressure Injury 03/02/20 Thigh Posterior;Proximal;Right Stage 2 -  Partial thickness loss of dermis presenting as a shallow open injury with a red, pink wound bed without slough. (Active)  03/02/20 2100  Location: Thigh  Location Orientation: Posterior;Proximal;Right  Staging: Stage 2 -  Partial thickness loss of dermis presenting as a shallow open injury with a red, pink wound bed without slough.  Wound Description (Comments):   Present on Admission: Yes     Pressure Injury 03/02/20 Heel Left Unstageable - Full thickness tissue loss in which the base of the injury is covered by slough  (yellow, tan, gray, green or brown) and/or eschar (tan, brown or black) in the wound bed. (Active)  03/02/20 2100  Location: Heel  Location Orientation: Left  Staging: Unstageable - Full thickness tissue loss in which the base of the injury is covered by slough (yellow, tan, gray, green or brown) and/or eschar (tan, brown or black) in the wound bed.  Wound Description (Comments):   Present on Admission: Yes      PHYSICAL EXAMINATION:  GENERAL:critically ill appearing,  HEAD: Normocephalic, atraumatic.  EYES: Pupils equal, round, reactive to light.  No scleral icterus.  MOUTH: Moist mucosal membrane. NECK: Supple. No thyromegaly. No nodules. No JVD.  PULMONARY: +rhonchi, +wheezing CARDIOVASCULAR: S1 and S2. Regular rate and rhythm. No murmurs, rubs, or gallops.  GASTROINTESTINAL: Soft, nontender, -distended. Positive bowel sounds.  MUSCULOSKELETAL: SACRAL DECUB ULCERS STAGE 4 NEUROLOGIC: ALERT AND AWAKE SKIN:intact,warm,dry    MEDICATIONS: I have reviewed all medications and confirmed regimen as documented   CULTURE RESULTS   Recent Results (from the past 240 hour(s))  Blood Culture (routine x 2)     Status: Abnormal   Collection Time: 03/02/20  2:31 PM   Specimen: BLOOD  Result Value Ref Range Status   Specimen Description   Final    BLOOD BLOOD RIGHT HAND Performed at [facility], [address], [place], [address] [place]    Special Requests   Final    BOTTLES  DRAWN AEROBIC AND ANAEROBIC Blood Culture adequate volume Performed at [facility], [address] [place], [place] [address]    Culture  Setup Time   Final    GRAM POSITIVE COCCI IN BOTH AEROBIC AND ANAEROBIC BOTTLES CRITICAL VALUE NOTED.  VALUE IS CONSISTENT WITH PREVIOUSLY REPORTED AND CALLED VALUE. Performed at [facility], [address] [place], [place] [address]    Culture (A)  Final    GROUP A STREP (S.PYOGENES) ISOLATED ESCHERICHIA COLI CRITICAL RESULT CALLED  TO, READ BACK BY AND VERIFIED WITH: Ferne ReusM. SLAUGHTER PHARMD, AT [phone] 03/04/20 BY D. VANHOOK SUSCEPTIBILITIES PERFORMED ON PREVIOUS CULTURE WITHIN THE LAST 5 DAYS. Performed at [facility], [address] [place], [place] [address]    Report Status 03/05/2020 FINAL  Final   Organism ID, Bacteria ESCHERICHIA COLI  Final      Susceptibility   Escherichia coli - MIC*    AMPICILLIN >=32 RESISTANT Resistant     CEFAZOLIN <=4 SENSITIVE Sensitive     CEFEPIME <=1 SENSITIVE Sensitive     CEFTAZIDIME <=1 SENSITIVE Sensitive     CEFTRIAXONE <=1 SENSITIVE Sensitive     CIPROFLOXACIN <=0.25 SENSITIVE Sensitive     GENTAMICIN <=1 SENSITIVE Sensitive     IMIPENEM <=0.25 SENSITIVE Sensitive     TRIMETH/SULFA >=320 RESISTANT Resistant     AMPICILLIN/SULBACTAM 16 INTERMEDIATE Intermediate     PIP/TAZO <=4 SENSITIVE Sensitive     * ESCHERICHIA COLI  Blood Culture (routine x 2)     Status: Abnormal   Collection Time: 03/02/20  2:31 PM   Specimen: BLOOD  Result Value Ref Range Status   Specimen Description   Final    BLOOD Blood Culture results may not be optimal due to an inadequate volume of blood received in culture bottles Performed at [facility], [address] [place], [place] [address]    Special Requests   Final    BOTTLES DRAWN AEROBIC AND ANAEROBIC BLOOD LEFT HAND Performed at [facility], [address] [place], [place] [address]    Culture  Setup Time   Final    GRAM POSITIVE COCCI CRITICAL RESULT CALLED TO, READ BACK BY AND VERIFIED WITH: SCOTT HALL @ [phone] 03/03/2020 RH IN BOTH AEROBIC AND ANAEROBIC BOTTLES    Culture (A)  Final    GROUP A STREP (S.PYOGENES) ISOLATED HEALTH DEPARTMENT NOTIFIED Performed at [facility], [address] [place], [place] [address]    Report Status 03/05/2020 FINAL  Final   Organism ID, Bacteria GROUP A STREP (S.PYOGENES) ISOLATED  Final      Susceptibility   Group a strep (s.pyogenes) isolated - MIC*    PENICILLIN  <=0.06 SENSITIVE Sensitive     CEFTRIAXONE <=0.12 SENSITIVE Sensitive     ERYTHROMYCIN <=0.12 SENSITIVE Sensitive     LEVOFLOXACIN 0.5 SENSITIVE Sensitive     VANCOMYCIN 0.5 SENSITIVE Sensitive     * GROUP A STREP (S.PYOGENES) ISOLATED  Urine culture     Status: Abnormal (Preliminary result)   Collection Time: 03/02/20  2:31 PM   Specimen: In/Out Cath Urine  Result Value Ref Range Status   Specimen Description   Final    IN/OUT CATH URINE Performed at [facility], [address] [place], [place] [address]    Special Requests   Final    NONE Performed at [facility], [address] [place], [place] [address]    Culture (A)  Final    >=100,000 COLONIES/mL  ESCHERICHIA COLI 60,000 COLONIES/mL PROTEUS MIRABILIS CULTURE REINCUBATED FOR BETTER GROWTH Performed at [facility], [address] [place], [place] [address]    Report Status PENDING  Incomplete   Organism ID, Bacteria ESCHERICHIA COLI (A)  Final      Susceptibility   Escherichia coli - MIC*    AMPICILLIN >=32 RESISTANT Resistant     CEFAZOLIN <=4 SENSITIVE Sensitive     CEFTRIAXONE <=1 SENSITIVE Sensitive     CIPROFLOXACIN <=0.25 SENSITIVE Sensitive     GENTAMICIN <=1 SENSITIVE Sensitive     IMIPENEM <=0.25 SENSITIVE Sensitive     NITROFURANTOIN 32 SENSITIVE Sensitive     TRIMETH/SULFA >=320 RESISTANT Resistant     AMPICILLIN/SULBACTAM 16 INTERMEDIATE Intermediate     PIP/TAZO <=4 SENSITIVE Sensitive     * >=100,000 COLONIES/mL ESCHERICHIA COLI  SARS Coronavirus 2 by RT PCR (hospital order, performed in [facility] hospital lab) Nasopharyngeal Nasopharyngeal Swab     Status: None   Collection Time: 03/02/20  2:31 PM   Specimen: Nasopharyngeal Swab  Result Value Ref Range Status   SARS Coronavirus 2 NEGATIVE NEGATIVE Final    Comment: (NOTE) SARS-CoV-2 target nucleic acids are NOT DETECTED. The SARS-CoV-2 RNA is generally detectable in upper and lower respiratory specimens during the  acute phase of infection. The lowest concentration of SARS-CoV-2 viral copies this assay can detect is 250 copies / mL. A negative result does not preclude SARS-CoV-2 infection and should not be used as the sole basis for treatment or other patient management decisions.  A negative result may occur with improper specimen collection / handling, submission of specimen other than nasopharyngeal swab, presence of viral mutation(s) within the areas targeted by this assay, and inadequate number of viral copies (<250 copies / mL). A negative result must be combined with clinical observations, patient history, and epidemiological information. Fact Sheet for Patients:   BoilerBrush.com.cy Fact Sheet for Healthcare Providers: https://pope.com/ This test is not yet approved or cleared  by the [place] FDA and has been authorized for detection and/or diagnosis of SARS-CoV-2 by FDA under an Emergency Use Authorization (EUA).  This EUA will remain in effect (meaning this test can be used) for the duration of the COVID-19 declaration under Section 564(b)(1) of the Act, 21 U.S.C. section 360bbb-3(b)(1), unless the authorization is terminated or revoked sooner. Performed at [facility], [address] [place], [place] [address]   Blood Culture ID Panel (Reflexed)     Status: Abnormal   Collection Time: 03/02/20  2:31 PM  Result Value Ref Range Status   Enterococcus species NOT DETECTED NOT DETECTED Final   Listeria monocytogenes NOT DETECTED NOT DETECTED Final   Staphylococcus species NOT DETECTED NOT DETECTED Final   Staphylococcus aureus (BCID) NOT DETECTED NOT DETECTED Final   Streptococcus species DETECTED (A) NOT DETECTED Final    Comment: CRITICAL RESULT CALLED TO, READ BACK BY AND VERIFIED WITH: SCOTT HALL @ [phone] 03/03/2020 RH    Streptococcus agalactiae NOT DETECTED NOT DETECTED Final   Streptococcus pneumoniae NOT DETECTED NOT  DETECTED Final   Streptococcus pyogenes DETECTED (A) NOT DETECTED Final    Comment: CRITICAL RESULT CALLED TO, READ BACK BY AND VERIFIED WITH: SCOTT HALL @ [phone] ON 03/03/2020 RH    Acinetobacter baumannii NOT DETECTED NOT DETECTED Final   Enterobacteriaceae species NOT DETECTED NOT DETECTED Final   Enterobacter cloacae complex NOT DETECTED NOT DETECTED Final   Escherichia coli NOT DETECTED NOT DETECTED Final   Klebsiella oxytoca NOT DETECTED  NOT DETECTED Final   Klebsiella pneumoniae NOT DETECTED NOT DETECTED Final   Proteus species NOT DETECTED NOT DETECTED Final   Serratia marcescens NOT DETECTED NOT DETECTED Final   Haemophilus influenzae NOT DETECTED NOT DETECTED Final   Neisseria meningitidis NOT DETECTED NOT DETECTED Final   Pseudomonas aeruginosa NOT DETECTED NOT DETECTED Final   Candida albicans NOT DETECTED NOT DETECTED Final   Candida glabrata NOT DETECTED NOT DETECTED Final   Candida krusei NOT DETECTED NOT DETECTED Final   Candida parapsilosis NOT DETECTED NOT DETECTED Final   Candida tropicalis NOT DETECTED NOT DETECTED Final    Comment: Performed at [facility], [address] [place], [place] [address]  Aerobic Culture (superficial specimen)     Status: None (Preliminary result)   Collection Time: 03/03/20  1:40 PM   Specimen: Wound  Result Value Ref Range Status   Specimen Description   Final    WOUND Performed at [facility], [address] [place], [place] [address]    Special Requests   Final    NONE Performed at [facility], [place], [place] [address]    Gram Stain NO WBC SEEN RARE GRAM POSITIVE COCCI IN PAIRS   Final   Culture   Final    FEW PROTEUS MIRABILIS IDENTIFICATION AND SUSCEPTIBILITIES TO FOLLOW Performed at [facility], [place] [address] [place], [place] [address]    Report Status PENDING  Incomplete        BMP Latest Ref Rng & Units 03/06/2020 03/05/2020 03/05/2020  Glucose 70 - 99  mg/dL 102(H) - 99  BUN 6 - 20 mg/dL 13 - 19  Creatinine 0.44 - 1.00 mg/dL 0.46 - 0.45  Sodium 135 - 145 mmol/L 128(L) - 129(L)  Potassium 3.5 - 5.1 mmol/L 2.5(LL) 3.0(L) 3.3(L)  Chloride 98 - 111 mmol/L 98 - 94(L)  CO2 22 - 32 mmol/L 23 - 26  Calcium 8.9 - 10.3 mg/dL 5.9(LL) - 7.1(L)      IMAGING    No results found.   Nutrition Status: Nutrition Problem: Increased nutrient needs Etiology: wound healing Signs/Symptoms: estimated needs Interventions: Refer to RD note for recommendations      Indwelling Urinary Catheter continued, requirement due to   Reason to continue Indwelling Urinary Catheter strict Intake/Output monitoring for hemodynamic instability   Central Line/ continued, requirement due to  Reason to continue [place] of central venous pressure or other hemodynamic parameters and poor IV access       ASSESSMENT AND PLAN SYNOPSIS    Admitted for Severe Sepsis/SEPTIC SHOCK due  secondary to UTI & aspiration Pneumonia Sacral & left Heel decubitus ulcers, wound to right posterior leg inferior to gluteus muscle +STREP A PYOGENES BACTEREMIA +GRAM NEG BACTEREMIA  INFECTIOUS DISEASE -continue antibiotics as prescribed -follow up cultures -follow up ID consultation   Septic shock -use vasopressors to keep MAP>55 -follow ABG and LA -aggressive IV fluid Resuscitation    CARDIAC ICU monitoring   DIET--> as tolerated   ELECTROLYTES -follow labs as needed -replace as needed -pharmacy consultation and following   DVT/GI PRX ordered and assessed TRANSFUSIONS AS NEEDED MONITOR FSBS I Assessed the need for Labs I Assessed the need for Foley I Assessed the need for Central Venous Line Family Discussion when available I Assessed the need for Mobilization I made an Assessment of medications to be adjusted accordingly Safety Risk assessment completed  CASE DISCUSSED IN MULTIDISCIPLINARY ROUNDS WITH ICU TEAM    CASE  DISCUSSED  IN MULTIDISCIPLINARY ROUNDS WITH ICU TEAM    Kama Cammarano Santiago Glad, M.D.  [facility]  Medical Director [facility] Medical Director [facility] Cardio-Pulmonary Department

## 2020-03-06 NOTE — Progress Notes (Signed)
PHARMACY CONSULT NOTE  Pharmacy Consult for Electrolyte Monitoring and Replacement   Recent Labs: Potassium (mmol/L)  Date Value  03/06/2020 2.5 (LL)   Magnesium (mg/dL)  Date Value  07/21/5746 1.5 (L)   Calcium (mg/dL)  Date Value  34/12/7094 5.9 (LL)   Albumin (g/dL)  Date Value  43/83/8184 2.3 (L)   Phosphorus (mg/dL)  Date Value  03/75/4360 2.5   Sodium (mmol/L)  Date Value  03/06/2020 128 (L)   Corrected Ca: 7.26 mg/dL  Assessment: 49 year old female presented with altered mental status. Patient with h/o TBI with tracheostomy. Patient with strep pyogenes bacteremia. She has sacral and left heel decubitus ulcers as well as a wound to the right posterior leg. Pharmacy to manage electrolytes. Electrolytes low, concerning for refeeding.  Goal of Therapy:  Electrolytes WNL  Plan:  Orders for the following 5/15 am:  magnesium sulfate 4 g IV x 1  potassium 10 mEq IV x 5   Calcium gluconate 1 gram IV x 1  Follow-Up levels at 1300: electrolytes much improved   Will f/u electrolytes with morning labs  Lowella Bandy ,PharmD Clinical Pharmacist 03/06/2020 10:31 AM

## 2020-03-07 DIAGNOSIS — R6520 Severe sepsis without septic shock: Secondary | ICD-10-CM

## 2020-03-07 LAB — CBC WITH DIFFERENTIAL/PLATELET
Abs Immature Granulocytes: 0.31 10*3/uL — ABNORMAL HIGH (ref 0.00–0.07)
Basophils Absolute: 0 10*3/uL (ref 0.0–0.1)
Basophils Relative: 0 %
Eosinophils Absolute: 0 10*3/uL (ref 0.0–0.5)
Eosinophils Relative: 0 %
HCT: 23.4 % — ABNORMAL LOW (ref 36.0–46.0)
Hemoglobin: 7.6 g/dL — ABNORMAL LOW (ref 12.0–15.0)
Immature Granulocytes: 3 %
Lymphocytes Relative: 9 %
Lymphs Abs: 0.9 10*3/uL (ref 0.7–4.0)
MCH: 27 pg (ref 26.0–34.0)
MCHC: 32.5 g/dL (ref 30.0–36.0)
MCV: 83.3 fL (ref 80.0–100.0)
Monocytes Absolute: 0.3 10*3/uL (ref 0.1–1.0)
Monocytes Relative: 3 %
Neutro Abs: 8.7 10*3/uL — ABNORMAL HIGH (ref 1.7–7.7)
Neutrophils Relative %: 85 %
Platelets: 124 10*3/uL — ABNORMAL LOW (ref 150–400)
RBC: 2.81 MIL/uL — ABNORMAL LOW (ref 3.87–5.11)
RDW: 13.3 % (ref 11.5–15.5)
WBC: 10.2 10*3/uL (ref 4.0–10.5)
nRBC: 0 % (ref 0.0–0.2)

## 2020-03-07 LAB — GLUCOSE, CAPILLARY
Glucose-Capillary: 101 mg/dL — ABNORMAL HIGH (ref 70–99)
Glucose-Capillary: 102 mg/dL — ABNORMAL HIGH (ref 70–99)
Glucose-Capillary: 114 mg/dL — ABNORMAL HIGH (ref 70–99)
Glucose-Capillary: 121 mg/dL — ABNORMAL HIGH (ref 70–99)
Glucose-Capillary: 122 mg/dL — ABNORMAL HIGH (ref 70–99)
Glucose-Capillary: 124 mg/dL — ABNORMAL HIGH (ref 70–99)
Glucose-Capillary: 150 mg/dL — ABNORMAL HIGH (ref 70–99)
Glucose-Capillary: 94 mg/dL (ref 70–99)

## 2020-03-07 LAB — BASIC METABOLIC PANEL
Anion gap: 6 (ref 5–15)
BUN: 12 mg/dL (ref 6–20)
CO2: 25 mmol/L (ref 22–32)
Calcium: 6.9 mg/dL — ABNORMAL LOW (ref 8.9–10.3)
Chloride: 98 mmol/L (ref 98–111)
Creatinine, Ser: 0.5 mg/dL (ref 0.44–1.00)
GFR calc Af Amer: >60 mL/min (ref >60)
GFR calc non Af Amer: >60 mL/min (ref >60)
Glucose, Bld: 131 mg/dL — ABNORMAL HIGH (ref 70–99)
Potassium: 3 mmol/L — ABNORMAL LOW (ref 3.5–5.1)
Sodium: 129 mmol/L — ABNORMAL LOW (ref 135–145)

## 2020-03-07 LAB — MAGNESIUM: Magnesium: 2.2 mg/dL (ref 1.7–2.4)

## 2020-03-07 LAB — PHOSPHORUS: Phosphorus: 2.2 mg/dL — ABNORMAL LOW (ref 2.5–4.6)

## 2020-03-07 MED ORDER — POTASSIUM CHLORIDE 10 MEQ/50ML IV SOLN
10.0000 meq | INTRAVENOUS | Status: AC
  Administered 2020-03-07 (×4): 10 meq via INTRAVENOUS
  Filled 2020-03-07 (×6): qty 50

## 2020-03-07 MED ORDER — SODIUM PHOSPHATES 45 MMOLE/15ML IV SOLN
10.0000 mmol | Freq: Once | INTRAVENOUS | Status: AC
  Administered 2020-03-07: 10 mmol via INTRAVENOUS
  Filled 2020-03-07: qty 3.33

## 2020-03-07 MED ORDER — LACTATED RINGERS IV BOLUS
1000.0000 mL | Freq: Once | INTRAVENOUS | Status: AC
  Administered 2020-03-07: 1000 mL via INTRAVENOUS

## 2020-03-07 NOTE — Progress Notes (Signed)
PHARMACY CONSULT NOTE  Pharmacy Consult for Electrolyte Monitoring and Replacement   Recent Labs: Potassium (mmol/L)  Date Value  03/07/2020 3.0 (L)   Magnesium (mg/dL)  Date Value  91/55/0271 2.2   Calcium (mg/dL)  Date Value  42/32/0094 6.9 (L)   Albumin (g/dL)  Date Value  17/91/9957 2.3 (L)   Phosphorus (mg/dL)  Date Value  90/06/2003 2.2 (L)   Sodium (mmol/L)  Date Value  03/07/2020 129 (L)   Corrected Ca: 8.26 mg/dL  Assessment: 49 year old female presented with altered mental status. Patient with h/o TBI with tracheostomy. Patient with strep pyogenes bacteremia. She has sacral and left heel decubitus ulcers as well as a wound to the right posterior leg. Pharmacy to manage electrolytes. Electrolytes low, concerning for refeeding.  Goal of Therapy:  Electrolytes WNL  Plan:  Orders for the following 5/16 am:  potassium 10 mEq IV x 4   LR bolus 1000 mL  Sodium phosphate 10 mmol IV x 1  Lowella Bandy ,PharmD Clinical Pharmacist 03/07/2020 6:54 AM

## 2020-03-07 NOTE — Progress Notes (Signed)
Pt stable throughout shift, Art Line removed; waveform distorted, unable to draw blood, MD gave verbal order to remove.  Pressure applied, bleeding controlled.  Dressings changed on central line and wounds.

## 2020-03-07 NOTE — Progress Notes (Signed)
Patient had 1 incontinent episode this shift, no other urine output noted. Pt bladder scan showed urine in bladder. Continuing to monitor and advise day shift.

## 2020-03-07 NOTE — Progress Notes (Signed)
CRITICAL CARE NOTE 49 YO WITH  STREP PYOGENES BACTEREMIA FROM SACRAL WOUNDS WITH TBI S/P [facility]  5/11 -5/15  Admitted FOR SEPTIC SHOCK +STREP PYOGENES AND E COLI BACTEREMIA ID CONSULTATION MULTIPLE VASOPRESSORS GEN SURGERY CONSULTATION  5/15 more alert and awake, FULL CODE    CC  Septic shock    SUBJECTIVE On pressors Prognosis is poor somnolent today horrible sacral wounds   BP (!) 79/59   Pulse 63   Temp (!) 97.5 F (36.4 C) (Axillary)   Resp (!) 22   Ht 5\' 7"  (1.702 m)   Wt 69.9 kg   LMP  (LMP Unknown)   SpO2 100%   BMI 24.14 kg/m    I/O last 3 completed shifts: In: 2820.4 [P.O.:240; I.V.:1190.2; IV Piggyback:1390.2] Out: 300 [Urine:300] No intake/output data recorded.  SpO2: 100 % O2 Flow Rate (L/min): 2 L/min FiO2 (%): 28 %  Estimated body mass index is 24.14 kg/m as calculated from the following:   Height as of this encounter: 5\' 7"  (1.702 m).   Weight as of this encounter: 69.9 kg. LIMITED ROS   Pressure Injury 11/15/19 Coccyx Stage 3 -  Full thickness tissue loss. Subcutaneous fat may be visible but bone, tendon or muscle are NOT exposed. red;bleeding (Active)  11/15/19 1500  Location: Coccyx  Location Orientation:   Staging: Stage 3 -  Full thickness tissue loss. Subcutaneous fat may be visible but bone, tendon or muscle are NOT exposed.  Wound Description (Comments): red;bleeding  Present on Admission: Yes     Pressure Injury 03/02/20 Thigh Posterior;Proximal;Right Stage 2 -  Partial thickness loss of dermis presenting as a shallow open injury with a red, pink wound bed without slough. (Active)  03/02/20 2100  Location: Thigh  Location Orientation: Posterior;Proximal;Right  Staging: Stage 2 -  Partial thickness loss of dermis presenting as a shallow open injury with a red, pink wound bed without slough.  Wound Description (Comments):   Present on Admission: Yes     Pressure Injury 03/02/20 Heel Left Unstageable - Full thickness tissue  loss in which the base of the injury is covered by slough (yellow, tan, gray, green or brown) and/or eschar (tan, brown or black) in the wound bed. (Active)  03/02/20 2100  Location: Heel  Location Orientation: Left  Staging: Unstageable - Full thickness tissue loss in which the base of the injury is covered by slough (yellow, tan, gray, green or brown) and/or eschar (tan, brown or black) in the wound bed.  Wound Description (Comments):   Present on Admission: Yes    REVIEW OF SYSTEMS  PATIENT IS UNABLE TO PROVIDE COMPLETE REVIEW OF SYSTEM S ENCEPHALOPATHY THIS AM    PHYSICAL EXAMINATION:  GENERAL:critically ill appearing,  HEAD: Normocephalic, atraumatic.  EYES: Pupils equal, round, reactive to light.  No scleral icterus.  MOUTH: Moist mucosal membrane. NECK: Supple. No thyromegaly. No nodules. No JVD.  PULMONARY: +rhonchi,  CARDIOVASCULAR: S1 and S2. Regular rate and rhythm. No murmurs, rubs, or gallops.  GASTROINTESTINAL: Soft, nontender, -distended. Positive bowel sounds.  MUSCULOSKELETAL: SACRAL DECUB ULCERS STAGE 4 NEUROLOGIC: obtunded SKIN:intact,warm,dry  MEDICATIONS: I have reviewed all medications and confirmed regimen as documented   CULTURE RESULTS   Recent Results (from the past 240 hour(s))  Blood Culture (routine x 2)     Status: Abnormal   Collection Time: 03/02/20  2:31 PM   Specimen: BLOOD  Result Value Ref Range Status   Specimen Description   Final    BLOOD BLOOD RIGHT HAND Performed at  [facility], [address], [place], [place] [address]    Special Requests   Final    BOTTLES DRAWN AEROBIC AND ANAEROBIC Blood Culture adequate volume Performed at [facility], [address] [place], [place] [address]    Culture  Setup Time   Final    GRAM POSITIVE COCCI IN BOTH AEROBIC AND ANAEROBIC BOTTLES CRITICAL VALUE NOTED.  VALUE IS CONSISTENT WITH PREVIOUSLY REPORTED AND CALLED VALUE. Performed at [facility], [address] [place], [place] [address]    Culture (A)  Final    GROUP A STREP (S.PYOGENES) ISOLATED ESCHERICHIA COLI CRITICAL RESULT CALLED TO, READ BACK BY AND VERIFIED WITH: Ferne Reus PHARMD, AT [phone] 03/04/20 BY D. VANHOOK SUSCEPTIBILITIES PERFORMED ON PREVIOUS CULTURE WITHIN THE LAST 5 DAYS. Performed at [facility], [address] [place], [place] [address]    Report Status 03/05/2020 FINAL  Final   Organism ID, Bacteria ESCHERICHIA COLI  Final      Susceptibility   Escherichia coli - MIC*    AMPICILLIN >=32 RESISTANT Resistant     CEFAZOLIN <=4 SENSITIVE Sensitive     CEFEPIME <=1 SENSITIVE Sensitive     CEFTAZIDIME <=1 SENSITIVE Sensitive     CEFTRIAXONE <=1 SENSITIVE Sensitive     CIPROFLOXACIN <=0.25 SENSITIVE Sensitive     GENTAMICIN <=1 SENSITIVE Sensitive     IMIPENEM <=0.25 SENSITIVE Sensitive     TRIMETH/SULFA >=320 RESISTANT Resistant     AMPICILLIN/SULBACTAM 16 INTERMEDIATE Intermediate     PIP/TAZO <=4 SENSITIVE Sensitive     * ESCHERICHIA COLI  Blood Culture (routine x 2)     Status: Abnormal   Collection Time: 03/02/20  2:31 PM   Specimen: BLOOD  Result Value Ref Range Status   Specimen Description   Final    BLOOD Blood Culture results may not be optimal due to an inadequate volume of blood received in culture bottles Performed at [facility], [address], [place], [place] [address]    Special Requests   Final    BOTTLES DRAWN AEROBIC AND ANAEROBIC BLOOD LEFT HAND Performed at [facility], [address], [place], [place] [address]    Culture  Setup Time   Final    GRAM POSITIVE COCCI CRITICAL RESULT CALLED TO, READ BACK BY AND VERIFIED WITH: SCOTT HALL @ [phone] 03/03/2020 RH IN BOTH AEROBIC AND ANAEROBIC BOTTLES    Culture (A)  Final    GROUP A STREP (S.PYOGENES) ISOLATED HEALTH DEPARTMENT NOTIFIED Performed at [facility], [address] [place], [place] [address]    Report Status 03/05/2020 FINAL  Final    Organism ID, Bacteria GROUP A STREP (S.PYOGENES) ISOLATED  Final      Susceptibility   Group a strep (s.pyogenes) isolated - MIC*    PENICILLIN <=0.06 SENSITIVE Sensitive     CEFTRIAXONE <=0.12 SENSITIVE Sensitive     ERYTHROMYCIN <=0.12 SENSITIVE Sensitive     LEVOFLOXACIN 0.5 SENSITIVE Sensitive     VANCOMYCIN 0.5 SENSITIVE Sensitive     * GROUP A STREP (S.PYOGENES) ISOLATED  Urine culture     Status: Abnormal   Collection Time: 03/02/20  2:31 PM   Specimen: In/Out Cath Urine  Result Value Ref Range Status   Specimen Description   Final    IN/OUT CATH URINE Performed at [facility], [address] [place], [place] [address]    Special Requests   Final    NONE Performed at [facility], [address] [place], [place] [address]    Culture (A)  Final    >=100,000 COLONIES/mL ESCHERICHIA COLI 60,000 COLONIES/mL PROTEUS MIRABILIS    Report Status 03/06/2020 FINAL  Final   Organism ID, Bacteria ESCHERICHIA COLI (A)  Final   Organism ID, Bacteria PROTEUS MIRABILIS (A)  Final      Susceptibility   Escherichia coli - MIC*    AMPICILLIN >=32 RESISTANT Resistant     CEFAZOLIN <=4 SENSITIVE Sensitive     CEFTRIAXONE <=1 SENSITIVE Sensitive     CIPROFLOXACIN <=0.25 SENSITIVE Sensitive     GENTAMICIN <=1 SENSITIVE Sensitive     IMIPENEM <=0.25 SENSITIVE Sensitive     NITROFURANTOIN 32 SENSITIVE Sensitive     TRIMETH/SULFA >=320 RESISTANT Resistant     AMPICILLIN/SULBACTAM 16 INTERMEDIATE Intermediate     PIP/TAZO <=4 SENSITIVE Sensitive     * >=100,000 COLONIES/mL ESCHERICHIA COLI   Proteus mirabilis - MIC*    AMPICILLIN <=2 SENSITIVE Sensitive     CEFAZOLIN <=4 SENSITIVE Sensitive     CEFTRIAXONE <=1 SENSITIVE Sensitive     CIPROFLOXACIN <=0.25 SENSITIVE Sensitive     GENTAMICIN <=1 SENSITIVE Sensitive     IMIPENEM 2 SENSITIVE Sensitive     NITROFURANTOIN 128 RESISTANT Resistant     TRIMETH/SULFA <=20 SENSITIVE Sensitive     AMPICILLIN/SULBACTAM <=2  SENSITIVE Sensitive     PIP/TAZO <=4 SENSITIVE Sensitive     * 60,000 COLONIES/mL PROTEUS MIRABILIS  SARS Coronavirus 2 by RT PCR (hospital order, performed in [facility] hospital lab) Nasopharyngeal Nasopharyngeal Swab     Status: None   Collection Time: 03/02/20  2:31 PM   Specimen: Nasopharyngeal Swab  Result Value Ref Range Status   SARS Coronavirus 2 NEGATIVE NEGATIVE Final    Comment: (NOTE) SARS-CoV-2 target nucleic acids are NOT DETECTED. The SARS-CoV-2 RNA is generally detectable in upper and lower respiratory specimens during the acute phase of infection. The lowest concentration of SARS-CoV-2 viral copies this assay can detect is 250 copies / mL. A negative result does not preclude SARS-CoV-2 infection and should not be used as the sole basis for treatment or other patient management decisions.  A negative result may occur with improper specimen collection / handling, submission of specimen other than nasopharyngeal swab, presence of viral mutation(s) within the areas targeted by this assay, and inadequate number of viral copies (<250 copies / mL). A negative result must be combined with clinical observations, patient history, and epidemiological information. Fact Sheet for Patients:   BoilerBrush.com.cy Fact Sheet for Healthcare Providers: https://pope.com/ This test is not yet approved or cleared  by the [place] FDA and has been authorized for detection and/or diagnosis of SARS-CoV-2 by FDA under an Emergency Use Authorization (EUA).  This EUA will remain in effect (meaning this test can be used) for the duration of the COVID-19 declaration under Section 564(b)(1) of the Act, 21 U.S.C. section 360bbb-3(b)(1), unless the authorization is terminated or revoked sooner. Performed at [facility], [address] [place], [place] [address]   Blood Culture ID Panel (Reflexed)     Status: Abnormal   Collection  Time: 03/02/20  2:31 PM  Result Value Ref Range Status   Enterococcus species NOT DETECTED NOT DETECTED Final   Listeria monocytogenes NOT DETECTED NOT DETECTED Final   Staphylococcus species NOT DETECTED NOT DETECTED Final   Staphylococcus aureus (BCID) NOT DETECTED NOT DETECTED Final   Streptococcus species DETECTED (A) NOT DETECTED Final    Comment: CRITICAL RESULT  CALLED TO, READ BACK BY AND VERIFIED WITH: SCOTT HALL @ [phone] 03/03/2020 RH    Streptococcus agalactiae NOT DETECTED NOT DETECTED Final   Streptococcus pneumoniae NOT DETECTED NOT DETECTED Final   Streptococcus pyogenes DETECTED (A) NOT DETECTED Final    Comment: CRITICAL RESULT CALLED TO, READ BACK BY AND VERIFIED WITH: SCOTT HALL @ [phone] ON 03/03/2020 RH    Acinetobacter baumannii NOT DETECTED NOT DETECTED Final   Enterobacteriaceae species NOT DETECTED NOT DETECTED Final   Enterobacter cloacae complex NOT DETECTED NOT DETECTED Final   Escherichia coli NOT DETECTED NOT DETECTED Final   Klebsiella oxytoca NOT DETECTED NOT DETECTED Final   Klebsiella pneumoniae NOT DETECTED NOT DETECTED Final   Proteus species NOT DETECTED NOT DETECTED Final   Serratia marcescens NOT DETECTED NOT DETECTED Final   Haemophilus influenzae NOT DETECTED NOT DETECTED Final   Neisseria meningitidis NOT DETECTED NOT DETECTED Final   Pseudomonas aeruginosa NOT DETECTED NOT DETECTED Final   Candida albicans NOT DETECTED NOT DETECTED Final   Candida glabrata NOT DETECTED NOT DETECTED Final   Candida krusei NOT DETECTED NOT DETECTED Final   Candida parapsilosis NOT DETECTED NOT DETECTED Final   Candida tropicalis NOT DETECTED NOT DETECTED Final    Comment: Performed at [facility], [address] [place], [place] [address]  Aerobic Culture (superficial specimen)     Status: None   Collection Time: 03/03/20  1:40 PM   Specimen: Wound  Result Value Ref Range Status   Specimen Description   Final    WOUND Performed at [facility], [address] [place], [place] [address]    Special Requests   Final    NONE Performed at [facility], [address] [place], [place] [address]    Gram Stain   Final    NO WBC SEEN RARE GRAM POSITIVE COCCI IN PAIRS Performed at [facility], [place] [address] [place],  [address]    Culture FEW PROTEUS MIRABILIS  Final   Report Status 03/06/2020 FINAL  Final   Organism ID, Bacteria PROTEUS MIRABILIS  Final      Susceptibility   Proteus mirabilis - MIC*    AMPICILLIN <=2 SENSITIVE Sensitive     CEFAZOLIN <=4 SENSITIVE Sensitive     CEFEPIME <=1 SENSITIVE Sensitive     CEFTAZIDIME <=1 SENSITIVE Sensitive     CEFTRIAXONE <=1 SENSITIVE Sensitive     CIPROFLOXACIN <=0.25 SENSITIVE Sensitive     GENTAMICIN <=1 SENSITIVE Sensitive     IMIPENEM 4 SENSITIVE Sensitive     TRIMETH/SULFA <=20 SENSITIVE Sensitive     AMPICILLIN/SULBACTAM <=2 SENSITIVE Sensitive     PIP/TAZO <=4 SENSITIVE Sensitive     * FEW PROTEUS MIRABILIS        BMP Latest Ref Rng & Units 03/07/2020 03/06/2020 03/06/2020  Glucose 70 - 99 mg/dL 131(H) - 168(H)  BUN 6 - 20 mg/dL 12 - 13  Creatinine 0.44 - 1.00 mg/dL 0.50 - 0.52  Sodium 135 - 145 mmol/L 129(L) - 125(L)  Potassium 3.5 - 5.1 mmol/L 3.0(L) 3.4(L) 3.7  Chloride 98 - 111 mmol/L 98 - 95(L)  CO2 22 - 32 mmol/L 25 - 23  Calcium 8.9 - 10.3 mg/dL 6.9(L) - 6.9(L)      IMAGING    No results found.   Nutrition Status: Nutrition Problem: Increased nutrient needs Etiology: wound healing Signs/Symptoms: estimated needs Interventions: Refer to RD note for recommendations      Indwelling Urinary Catheter continued, requirement  due to   Reason to continue Indwelling Urinary Catheter strict Intake/Output monitoring for hemodynamic instability   Central Line/ continued, requirement due to  Reason to continue [employer] Line Monitoring of central venous pressure or other hemodynamic parameters and poor IV access        ASSESSMENT AND PLAN SYNOPSIS    Admitted for Severe Sepsis/SEPTIC SHOCK due  secondary to UTI & aspiration Pneumonia Sacral & left Heel decubitus ulcers, wound to right posterior leg inferior to gluteus muscle +STREP A PYOGENES BACTEREMIA +GRAM NEG BACTEREMIA  INFECTIOUS DISEASE -continue antibiotics as prescribed -follow up cultures -follow up ID consultation   Septic shock -use vasopressors to keep MAP>55  stress dose steroids -aggressive IV fluid Resuscitation    CARDIAC ICU monitoring   DIET--> as tolerated  ELECTROLYTES -follow labs as needed -replace as needed -pharmacy consultation and following    DVT/GI PRX ordered TRANSFUSIONS AS NEEDED MONITOR FSBS ASSESS the need for LABS as needed   DVT/GI PRX ordered and assessed TRANSFUSIONS AS NEEDED MONITOR FSBS I Assessed the need for Labs I Assessed the need for Foley I Assessed the need for Central Venous Line Family Discussion when available I Assessed the need for Mobilization I made an Assessment of medications to be adjusted accordingly Safety Risk assessment completed  CASE DISCUSSED IN MULTIDISCIPLINARY ROUNDS WITH ICU TEAM   Critical Care Time devoted to patient care services described in this note is 31 minutes.   Overall, patient is critically ill, prognosis is guarded.     Lucie LeatherKurian David Annlee Glandon, M.D.  [facility]  Medical Director [facility] Medical Director [facility] Cardio-P[facility] Department

## 2020-03-07 NOTE — Progress Notes (Signed)
Patient glucose 67. Pt given OJ nectar thick and applesauce. Recheck glucose was 102.

## 2020-03-08 ENCOUNTER — Inpatient Hospital Stay
Admit: 2020-03-08 | Discharge: 2020-03-08 | Disposition: A | Discharge status: Home / Self Care | Payer: Medicare PPO | Attending: Pulmonary Disease | Admitting: Pulmonary Disease

## 2020-03-08 LAB — BASIC METABOLIC PANEL
Anion gap: 8 (ref 5–15)
BUN: 12 mg/dL (ref 6–20)
CO2: 23 mmol/L (ref 22–32)
Calcium: 7.2 mg/dL — ABNORMAL LOW (ref 8.9–10.3)
Chloride: 101 mmol/L (ref 98–111)
Creatinine, Ser: 0.48 mg/dL (ref 0.44–1.00)
GFR calc Af Amer: >60 mL/min (ref >60)
GFR calc non Af Amer: >60 mL/min (ref >60)
Glucose, Bld: 158 mg/dL — ABNORMAL HIGH (ref 70–99)
Potassium: 2.6 mmol/L — CL (ref 3.5–5.1)
Sodium: 132 mmol/L — ABNORMAL LOW (ref 135–145)

## 2020-03-08 LAB — CBC WITH DIFFERENTIAL/PLATELET
Abs Immature Granulocytes: 0.78 10*3/uL — ABNORMAL HIGH (ref 0.00–0.07)
Basophils Absolute: 0 10*3/uL (ref 0.0–0.1)
Basophils Relative: 0 %
Eosinophils Absolute: 0 10*3/uL (ref 0.0–0.5)
Eosinophils Relative: 0 %
HCT: 22.9 % — ABNORMAL LOW (ref 36.0–46.0)
Hemoglobin: 7.9 g/dL — ABNORMAL LOW (ref 12.0–15.0)
Immature Granulocytes: 7 %
Lymphocytes Relative: 12 %
Lymphs Abs: 1.5 10*3/uL (ref 0.7–4.0)
MCH: 27.8 pg (ref 26.0–34.0)
MCHC: 34.5 g/dL (ref 30.0–36.0)
MCV: 80.6 fL (ref 80.0–100.0)
Monocytes Absolute: 0.7 10*3/uL (ref 0.1–1.0)
Monocytes Relative: 6 %
Neutro Abs: 9 10*3/uL — ABNORMAL HIGH (ref 1.7–7.7)
Neutrophils Relative %: 75 %
Platelets: 143 10*3/uL — ABNORMAL LOW (ref 150–400)
RBC: 2.84 MIL/uL — ABNORMAL LOW (ref 3.87–5.11)
RDW: 13.9 % (ref 11.5–15.5)
WBC: 12 10*3/uL — ABNORMAL HIGH (ref 4.0–10.5)
nRBC: 0.2 % (ref 0.0–0.2)

## 2020-03-08 LAB — GLUCOSE, CAPILLARY
Glucose-Capillary: 133 mg/dL — ABNORMAL HIGH (ref 70–99)
Glucose-Capillary: 121 mg/dL — ABNORMAL HIGH (ref 70–99)
Glucose-Capillary: 79 mg/dL (ref 70–99)
Glucose-Capillary: 84 mg/dL (ref 70–99)
Glucose-Capillary: 93 mg/dL (ref 70–99)

## 2020-03-08 LAB — ECHOCARDIOGRAM COMPLETE
Height: 67 in
Weight: 2666.68 oz

## 2020-03-08 LAB — MAGNESIUM: Magnesium: 2.3 mg/dL (ref 1.7–2.4)

## 2020-03-08 LAB — PHOSPHORUS: Phosphorus: 2.5 mg/dL (ref 2.5–4.6)

## 2020-03-08 MED ORDER — POTASSIUM CHLORIDE 10 MEQ/50ML IV SOLN
10.0000 meq | INTRAVENOUS | Status: AC
  Administered 2020-03-08 (×6): 10 meq via INTRAVENOUS
  Filled 2020-03-08 (×6): qty 50

## 2020-03-08 MED ORDER — FAMOTIDINE 20 MG PO TABS
20.0000 mg | ORAL_TABLET | Freq: Two times a day (BID) | ORAL | Status: DC
  Administered 2020-03-08 – 2020-03-09 (×3): 20 mg via ORAL
  Filled 2020-03-08 (×3): qty 1

## 2020-03-08 MED ORDER — LACTATED RINGERS IV BOLUS
1000.0000 mL | Freq: Once | INTRAVENOUS | Status: AC
  Administered 2020-03-08: 1000 mL via INTRAVENOUS

## 2020-03-08 MED ORDER — LACTATED RINGERS IV BOLUS: 1000.0000 mL | Freq: Once | INTRAVENOUS | Status: AC

## 2020-03-08 MED ORDER — CEFAZOLIN SODIUM-DEXTROSE 2-4 GM/100ML-% IV SOLN
2.0000 g | Freq: Three times a day (TID) | INTRAVENOUS | Status: DC
  Administered 2020-03-08 – 2020-03-16 (×24): 2 g via INTRAVENOUS
  Filled 2020-03-08 (×27): qty 100

## 2020-03-08 MED ORDER — ALBUMIN HUMAN 25 % IV SOLN
12.5000 g | Freq: Every day | INTRAVENOUS | Status: AC
  Administered 2020-03-08 – 2020-03-10 (×3): 12.5 g via INTRAVENOUS
  Filled 2020-03-08 (×3): qty 50

## 2020-03-08 MED ORDER — MIDODRINE HCL 5 MG PO TABS
5.0000 mg | ORAL_TABLET | Freq: Three times a day (TID) | ORAL | Status: DC
  Administered 2020-03-08 – 2020-03-16 (×26): 5 mg via ORAL
  Filled 2020-03-08 (×26): qty 1

## 2020-03-08 MED ORDER — RISAQUAD PO CAPS
1.0000 | ORAL_CAPSULE | Freq: Every day | ORAL | Status: DC
  Administered 2020-03-09: 1 via ORAL
  Filled 2020-03-08 (×2): qty 1

## 2020-03-08 NOTE — Progress Notes (Signed)
End of Shift Summary:    Patient was very lethargic throughout this day.  Poor PO intake except thickened liquids and applesauce with medications.  Patient received 6 runs of IV potassium for replacement this shift.  By the end of my shift I was able to wean patient off of both dopamine and vasopressin.  Vitals remain stable at this time.  Patient received 1 Liter bolus of LR this shift along with albumin.  Patient was also started on midodrine and received two doses over my shift.  It was noted that the patient did not void for nightshift so the patient was bladder scanned and was retaining urine.  Scanned for of urine.  Order received for foley catheter to be inserted.  Patient tolerated procedure well.  Patient had 500cc of clear yellow urine out over this shift.

## 2020-03-08 NOTE — Progress Notes (Signed)
PHARMACY CONSULT NOTE  Pharmacy Consult for Electrolyte Monitoring and Replacement   Recent Labs: Potassium (mmol/L)  Date Value  03/08/2020 2.6 (LL)   Magnesium (mg/dL)  Date Value  70/92/9574 2.3   Calcium (mg/dL)  Date Value  73/40/3709 7.2 (L)   Albumin (g/dL)  Date Value  64/38/3818 2.3 (L)   Phosphorus (mg/dL)  Date Value  40/37/5436 2.5   Sodium (mmol/L)  Date Value  03/08/2020 132 (L)   Corrected Ca: 8.56 mg/dL  Assessment: 49 year old female presented with altered mental status. Patient with h/o TBI with tracheostomy. Patient with strep pyogenes bacteremia. She has sacral and left heel decubitus ulcers as well as a wound to the right posterior leg. Pharmacy to manage electrolytes. Electrolytes low, concerning for refeeding.  Goal of Therapy:  Electrolytes WNL  Plan:  K 2.6: potassium chloride 10 mEq IV x 6 doses  LR bolus 1000 mL Phos and Mag improved since yesterday: no replacement indicated at this time.   Will continue to monitor with AM labs.   Delfin Gant ,PharmD Clinical Pharmacist 03/08/2020 2:06 PM
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
## 2020-03-08 NOTE — Progress Notes (Signed)
ID Pt is awake and alert respnds to questions But speech not comprehensible Tracheostomy No pain abdomen    Patient Vitals for the past 24 hrs:  BP Temp Temp src Pulse Resp SpO2 Weight  03/08/20 0800 -- -- -- -- -- 100 % --  03/08/20 0700 122/66 -- -- 62 (!) 21 98 % --  03/08/20 0600 (!) 160/94 -- -- 73 (!) 38 98 % --  03/08/20 0500 (!) 157/93 -- -- 67 18 99 % --  03/08/20 0447 -- -- -- -- -- -- 75.6 kg  03/08/20 0400 123/69 (!) 96.4 F (35.8 C) Axillary 64 (!) 23 98 % --  03/08/20 0300 116/67 -- -- (!) 52 17 100 % --  03/08/20 0246 -- -- -- -- -- 100 % --  03/07/20 2356 (!) 87/59 (!) 97.5 F (36.4 C) Oral 77 20 100 % --  03/07/20 2300 107/64 -- -- 66 (!) 21 100 % --  03/07/20 2200 100/66 -- -- -- (!) 26 -- --  03/07/20 2100 110/62 -- -- -- (!) 25 -- --  03/07/20 2000 115/66 -- -- 71 (!) 26 100 % --  03/07/20 1925 105/60 (!) 96.8 F (36 C) Axillary 76 (!) 27 98 % --  03/07/20 1900 106/64 -- -- 74 (!) 25 99 % --  03/07/20 1800 99/66 -- -- 72 (!) 26 100 % --  03/07/20 1600 101/66 (!) 96.6 F (35.9 C) Axillary 68 (!) 23 100 % --  03/07/20 1300 (!) 82/61 -- -- 62 20 100 % --   O/e Awake and alert afebrile Tracheostomy Chest b/l air entry Sacral wounds not examined as it was just dressed Rectal tube with loose stool Abd sot No tenderness  Labs  CBC Latest Ref Rng & Units 03/08/2020 03/07/2020 03/06/2020  WBC 4.0 - 10.5 K/uL 12.0(H) 10.2 14.9(H)  Hemoglobin 12.0 - 15.0 g/dL 7.9(L) 7.6(L) 8.4(L)  Hematocrit 36.0 - 46.0 % 22.9(L) 23.4(L) 24.5(L)  Platelets 150 - 400 K/uL 143(L) 124(L) 130(L)    CMP Latest Ref Rng & Units 03/08/2020 03/07/2020 03/06/2020  Glucose 70 - 99 mg/dL 195(K) 932(I) -  BUN 6 - 20 mg/dL 12 12 -  Creatinine 7.12 - 1.00 mg/dL 4.58 0.99 -  Sodium 833 - 145 mmol/L 132(L) 129(L) -  Potassium 3.5 - 5.1 mmol/L 2.6(LL) 3.0(L) 3.4(L)  Chloride 98 - 111 mmol/L 101 98 -  CO2 22 - 32 mmol/L 23 25 -  Calcium 8.9 - 10.3 mg/dL 7.2(L) 6.9(L) -  Total Protein 6.5  - 8.1 g/dL - - -  Total Bilirubin 0.3 - 1.2 mg/dL - - -  Alkaline Phos 38 - 126 U/L - - -  AST 15 - 41 U/L - - -  ALT 0 - 44 U/L - - -    Impression/recommendation Group B streptococcus bacteremia with septic shock- pt clinically looks much better eventhough still on 2 pressors- no tachycardia  E. coli bacteremia E. coli UTI  Sacral ulcer   De-escalate  to cefazolin  Diarrhea- with no fever, abdominal pain, or leucocytosis doubt this is cdiff- very likely antibiotic induced diarrhea DC clinda and zosyn and change to cefazolin- follow closely - if her clinical condition changes will check cdiff   Hypokalemia- being replaced  TBI -   Discussed the management with patient and her nurse and pharmacist

## 2020-03-08 NOTE — Progress Notes (Signed)
*  PRELIMINARY RESULTS* Echocardiogram 2D Echocardiogram has been performed.  Joanne [name] 03/08/2020, 1:24 PM

## 2020-03-09 LAB — CBC WITH DIFFERENTIAL/PLATELET
Abs Immature Granulocytes: 0.98 10*3/uL — ABNORMAL HIGH (ref 0.00–0.07)
Basophils Absolute: 0 10*3/uL (ref 0.0–0.1)
Basophils Relative: 0 %
Eosinophils Absolute: 0 10*3/uL (ref 0.0–0.5)
Eosinophils Relative: 0 %
HCT: 22.9 % — ABNORMAL LOW (ref 36.0–46.0)
Hemoglobin: 7.5 g/dL — ABNORMAL LOW (ref 12.0–15.0)
Immature Granulocytes: 9 %
Lymphocytes Relative: 11 %
Lymphs Abs: 1.3 10*3/uL (ref 0.7–4.0)
MCH: 27.5 pg (ref 26.0–34.0)
MCHC: 32.8 g/dL (ref 30.0–36.0)
MCV: 83.9 fL (ref 80.0–100.0)
Monocytes Absolute: 0.6 10*3/uL (ref 0.1–1.0)
Monocytes Relative: 5 %
Neutro Abs: 8.6 10*3/uL — ABNORMAL HIGH (ref 1.7–7.7)
Neutrophils Relative %: 75 %
Platelets: 154 10*3/uL (ref 150–400)
RBC: 2.73 MIL/uL — ABNORMAL LOW (ref 3.87–5.11)
RDW: 14.2 % (ref 11.5–15.5)
Smear Review: NORMAL
WBC: 11.5 10*3/uL — ABNORMAL HIGH (ref 4.0–10.5)
nRBC: 0 % (ref 0.0–0.2)

## 2020-03-09 LAB — BASIC METABOLIC PANEL
Anion gap: 5 (ref 5–15)
BUN: 9 mg/dL (ref 6–20)
CO2: 23 mmol/L (ref 22–32)
Calcium: 7.5 mg/dL — ABNORMAL LOW (ref 8.9–10.3)
Chloride: 110 mmol/L (ref 98–111)
Creatinine, Ser: 0.57 mg/dL (ref 0.44–1.00)
GFR calc Af Amer: >60 mL/min (ref >60)
GFR calc non Af Amer: >60 mL/min (ref >60)
Glucose, Bld: 111 mg/dL — ABNORMAL HIGH (ref 70–99)
Potassium: 2.8 mmol/L — ABNORMAL LOW (ref 3.5–5.1)
Sodium: 138 mmol/L (ref 135–145)

## 2020-03-09 LAB — GLUCOSE, CAPILLARY
Glucose-Capillary: 102 mg/dL — ABNORMAL HIGH (ref 70–99)
Glucose-Capillary: 126 mg/dL — ABNORMAL HIGH (ref 70–99)
Glucose-Capillary: 134 mg/dL — ABNORMAL HIGH (ref 70–99)
Glucose-Capillary: 50 mg/dL — ABNORMAL LOW (ref 70–99)
Glucose-Capillary: 65 mg/dL — ABNORMAL LOW (ref 70–99)
Glucose-Capillary: 71 mg/dL (ref 70–99)
Glucose-Capillary: 77 mg/dL (ref 70–99)
Glucose-Capillary: 92 mg/dL (ref 70–99)
Glucose-Capillary: 94 mg/dL (ref 70–99)

## 2020-03-09 LAB — MAGNESIUM: Magnesium: 2.1 mg/dL (ref 1.7–2.4)

## 2020-03-09 LAB — PHOSPHORUS: Phosphorus: 1.7 mg/dL — ABNORMAL LOW (ref 2.5–4.6)

## 2020-03-09 LAB — OCCULT BLOOD X 1 CARD TO LAB, STOOL: Fecal Occult Bld: NEGATIVE

## 2020-03-09 MED ORDER — POTASSIUM CHLORIDE CRYS ER 20 MEQ PO TBCR
40.0000 meq | EXTENDED_RELEASE_TABLET | Freq: Once | ORAL | Status: AC
  Administered 2020-03-09: 40 meq via ORAL
  Filled 2020-03-09: qty 2

## 2020-03-09 MED ORDER — HYDROCORTISONE NA SUCCINATE PF 100 MG IJ SOLR: 50.0000 mg | Freq: Two times a day (BID) | INTRAMUSCULAR | Status: DC

## 2020-03-09 MED ORDER — ENOXAPARIN SODIUM 40 MG/0.4ML ~~LOC~~ SOLN
40.0000 mg | SUBCUTANEOUS | Status: DC
  Administered 2020-03-09 – 2020-03-15 (×7): 40 mg via SUBCUTANEOUS
  Filled 2020-03-09 (×7): qty 0.4

## 2020-03-09 MED ORDER — HYDROCORTISONE NA SUCCINATE PF 100 MG IJ SOLR
50.0000 mg | Freq: Two times a day (BID) | INTRAMUSCULAR | Status: AC
  Administered 2020-03-09 – 2020-03-10 (×3): 50 mg via INTRAVENOUS
  Filled 2020-03-09 (×3): qty 1

## 2020-03-09 MED ORDER — RISAQUAD PO CAPS
1.0000 | ORAL_CAPSULE | Freq: Two times a day (BID) | ORAL | Status: DC
  Administered 2020-03-10 – 2020-03-16 (×12): 1 via ORAL
  Filled 2020-03-09 (×12): qty 1

## 2020-03-09 MED ORDER — POTASSIUM PHOSPHATES 15 MMOLE/5ML IV SOLN
30.0000 mmol | Freq: Once | INTRAVENOUS | Status: AC
  Administered 2020-03-09: 30 mmol via INTRAVENOUS
  Filled 2020-03-09: qty 10

## 2020-03-09 NOTE — Progress Notes (Signed)
PHARMACY CONSULT NOTE  Pharmacy Consult for Electrolyte Monitoring and Replacement   Recent Labs: Potassium (mmol/L)  Date Value  03/09/2020 2.8 (L)   Magnesium (mg/dL)  Date Value  82/50/0370 2.1   Calcium (mg/dL)  Date Value  48/88/9169 7.5 (L)   Albumin (g/dL)  Date Value  45/12/8880 2.3 (L)   Phosphorus (mg/dL)  Date Value  80/12/4915 1.7 (L)   Sodium (mmol/L)  Date Value  03/09/2020 138   Corrected Ca: 8.86 mg/dL  Assessment: 49 year old female presented with altered mental status. Patient with h/o TBI with tracheostomy. Patient with strep pyogenes bacteremia. She has sacral and left heel decubitus ulcers as well as a wound to the right posterior leg. Pharmacy to manage electrolytes. Electrolytes low, concerning for refeeding.  Goal of Therapy:  Electrolytes WNL: K ~4 Mg ~2  Plan:  K 2.8 and phos 1.7: added Kphos 30 mmol in D5 infusion 68mL/hr x1 dose    Will continue to monitor with AM labs.   Delfin Gant ,PharmD Clinical Pharmacist 03/09/2020 2:52 PM

## 2020-03-09 NOTE — Progress Notes (Signed)
Daily Progress Note   Patient Name: Joanne [name]       Date: 03/09/2020 DOB: Sep 16, 1971  Age: 49 y.o. MRN#: [id number] Attending Physician: Flora Lipps, MD Primary Care Physician: Patient, No Pcp Per Admit Date: 03/02/2020  Reason for Consultation/Follow-up: Establishing goals of care  Subjective: Patient is sitting in bed eating lunch. She denies complaint.  She is off pressors. No family at bedside. Will continue to follow.   Length of Stay: 7  Current Medications: Scheduled Meds:  . acidophilus  1 capsule Oral BID  . chlorhexidine  15 mL Mouth Rinse BID  . Chlorhexidine Gluconate Cloth  6 each Topical Daily  . enoxaparin (LOVENOX) injection  40 mg Subcutaneous Q24H  . hydrocortisone sod succinate (SOLU-CORTEF) inj  50 mg Intravenous Q12H  . insulin aspart  0-15 Units Subcutaneous Q4H  . levothyroxine  100 mcg Oral Daily  . mouth rinse  15 mL Mouth Rinse q12n4p  . midodrine  5 mg Oral TID WC  . multivitamin with minerals  1 tablet Oral Daily  . sodium chloride flush  3 mL Intravenous Q12H  . tamsulosin  0.4 mg Oral Daily    Continuous Infusions: . sodium chloride Stopped (03/09/20 0612)  . albumin human 12.5 g (03/09/20 1000)  .  ceFAZolin (ANCEF) IV 2 g (03/09/20 1330)  . potassium PHOSPHATE IVPB (in mmol) 30 mmol (03/09/20 1329)    PRN Meds: sodium chloride, acetaminophen, morphine injection, ondansetron (ZOFRAN) IV, sodium chloride flush  Physical Exam Pulmonary:     Comments: Trach in place.  Neurological:     Mental Status: She is alert.             Vital Signs: BP 107/64   Pulse 71   Temp 97.7 F (36.5 C) (Axillary)   Resp (!) 21   Ht 5\' 7"  (1.702 m)   Wt 75.6 kg   LMP  (LMP Unknown)   SpO2 100%   BMI 26.10 kg/m  SpO2: SpO2: 100 % O2 Device: O2  Device: Room Air O2 Flow Rate: O2 Flow Rate (L/min): 2 L/min  Intake/output summary:   Intake/Output Summary (Last 24 hours) at 03/09/2020 1336 Last data filed at 03/09/2020 0615 Gross per 24 hour  Intake 973.17 ml  Output 2800 ml  Net -1826.83 ml   LBM: Last BM Date:  03/08/20 Baseline Weight: Weight: 79.4 kg Most recent weight: Weight: 75.6 kg       Palliative Assessment/Data:    Flowsheet Rows     Most Recent Value  Intake Tab  Referral Department  Critical care  Unit at Time of Referral  ICU  Palliative Care Primary Diagnosis  Sepsis/Infectious Disease  Date Notified  03/03/20  Palliative Care Type  New Palliative care  Reason for referral  Clarify Goals of Care  Date of Admission  03/02/20  Date first seen by Palliative Care  03/03/20  # of days Palliative referral response time  0 Day(s)  # of days IP prior to Palliative referral  1  Clinical Assessment  Palliative Performance Scale Score  10%  Pain Max last 24 hours  Not able to report  Pain Min Last 24 hours  Not able to report  Dyspnea Max Last 24 Hours  Not able to report  Dyspnea Min Last 24 hours  Not able to report  Psychosocial & Spiritual Assessment  Palliative Care Outcomes      Patient Active Problem List   Diagnosis Date Noted  . Goals of care, counseling/discussion   . Palliative care by specialist   . DNR (do not resuscitate) discussion   . Septic shock (HCC) 03/02/2020  . Hypernatremia 11/15/2019  . Thrombocytopenia (HCC) 11/15/2019  . AKI (acute kidney injury) (HCC) 11/15/2019  . HTN (hypertension) 11/15/2019  . Non-insulin dependent type 2 diabetes mellitus (HCC) 11/15/2019  . Hypothyroidism 11/15/2019  . Severe sepsis with acute organ dysfunction (HCC) 11/15/2019  . Left hemiplegia (HCC) 11/15/2019  . Pressure injury of skin 11/15/2019  . Aspiration pneumonia due to gastric secretions (HCC)   . Acute kidney injury (AKI) with acute tubular necrosis (ATN) (HCC)   . Traumatic brain  injury (HCC)   . Acute on chronic respiratory failure with hypoxia (HCC) 06/06/2018  . 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 Pneumonia due to Klebsiella pneumoniae (HCC) 06/06/2018  . Acute on chronic combined systolic and diastolic CHF (congestive heart failure) (HCC) 06/06/2018  . Sepsis secondary to UTI (HCC) 06/06/2018  . ARDS (adult respiratory distress syndrome) (HCC) 06/06/2018  . Acute on chronic respiratory failure with hypoxia (HCC) 06/06/2018  . 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    Palliative Care Assessment & Plan     Recommendations/Plan: Palliative will continue to follow. Recommend palliative at D/C.    Code Status:    Code Status Orders  (From admission, onward)         Start     Ordered   03/02/20 1718  Full code  Continuous     03/02/20 1720        Code Status History    Date Active Date Inactive Code Status Order ID Comments User Context   11/15/2019 0253 11/21/2019 0411 Full Code [id number]  Andris Baumann, MD ED   09/10/2019 1522 10/16/2019 1653 Full Code [id number]  Ortencia Kick In[facility]   Advance Care Planning Activity      Prognosis:  Unable to determine   Thank you for allowing the Palliative Medicine Team to assist in the care of this patient.   Total Time 15 min Prolonged Time Billed  no      Greater than 50%  of this time was spent counseling and coordinating care related to the above assessment and plan.  Morton Stall, NP  Please contact Palliative Medicine Team phone at [phone] for questions and concerns.

## 2020-03-09 NOTE — NC FL2 (Signed)
[place] MEDICAID FL2 LEVEL OF CARE SCREENING TOOL     IDENTIFICATION  Patient Name: Joanne [name] Birthdate: May 22, 1971 Sex: female Admission Date (Current Location): 03/02/2020  [place] and [place] Number:  Chiropodist and Address:  [facility], [address], [place], [place] [address]      Provider Number: [id number]  Attending Physician Name and Address:  Erin Fulling, MD  Relative Name and Phone Number:       Current Level of Care: Hospital Recommended Level of Care: Skilled Nursing Facility Prior Approval Number:    Date Approved/Denied:   PASRR Number: [id number]  Discharge Plan: SNF    Current Diagnoses: Patient Active Problem List   Diagnosis Date Noted  . Goals of care, counseling/discussion   . Palliative care by specialist   . DNR (do not resuscitate) discussion   . Septic shock (HCC) 03/02/2020  . Hypernatremia 11/15/2019  . Thrombocytopenia (HCC) 11/15/2019  . AKI (acute kidney injury) (HCC) 11/15/2019  . HTN (hypertension) 11/15/2019  . Non-insulin dependent type 2 diabetes mellitus (HCC) 11/15/2019  . Hypothyroidism 11/15/2019  . Severe sepsis with acute organ dysfunction (HCC) 11/15/2019  . Left hemiplegia (HCC) 11/15/2019  . Pressure injury of skin 11/15/2019  . Aspiration pneumonia due to gastric secretions (HCC)   . Acute kidney injury (AKI) with acute tubular necrosis (ATN) (HCC)   . Traumatic brain injury (HCC)   . Acute on chronic respiratory failure with hypoxia (HCC) 06/06/2018  . 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 Pneumonia due to Klebsiella pneumoniae (HCC) 06/06/2018  . Acute on chronic combined systolic and diastolic CHF (congestive heart failure) (HCC) 06/06/2018  . Sepsis secondary to UTI (HCC) 06/06/2018  . ARDS (adult respiratory distress syndrome) (HCC) 06/06/2018  . Acute on chronic respiratory failure with hypoxia (HCC) 06/06/2018  . 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     Orientation RESPIRATION BLADDER Height & Weight     Self, Situation, Place  Tracheostomy, Normal Incontinent, Indwelling catheter Weight: 166 lb 10.7 oz (75.6 kg) Height:  5\' 7"  (170.2 cm)  BEHAVIORAL SYMPTOMS/MOOD NEUROLOGICAL BOWEL NUTRITION STATUS  (Calm, flat affect.) (None) Incontinent Diet(DYS 1. Fluid nectar thick. Extra Gravy on meats, potatoes. Butter on Oatmeal per Speech ok. Puddings, Yogurt TID meals. NO STRAWS!)  AMBULATORY STATUS COMMUNICATION OF NEEDS Skin     Verbally Other (Comment), PU Stage and Appropriate Care(MASD. Unstageable pressure injury on left heel: Foam BID.)   PU Stage 2 Dressing: BID(Right posterior proximal thigh: Foam.) PU Stage 3 Dressing: BID(Coccyx: Foam.)                 Personal Care Assistance Level of Assistance              Functional Limitations Info  Sight, Hearing, Speech Sight Info: Adequate Hearing Info: Adequate Speech Info: Impaired(Slurred/Dysarthria)    SPECIAL CARE FACTORS FREQUENCY                       Contractures Contractures Info: Present    Additional Factors Info  Code Status, Allergies Code Status Info: Full code Allergies Info: Kaopectate (Attapulgite), Thiopental, Tomato.           Current Medications (03/09/2020):  This is the current hospital active medication list Current Facility-Administered Medications  Medication Dose Route Frequency Provider Last Rate Last Admin  . 0.9 %  sodium chloride infusion  250 mL Intravenous PRN 03/11/2020, MD   Stopped at 03/09/20 03/11/20  . acetaminophen (TYLENOL) suppository  650 mg  650 mg Rectal Q4H PRN Bradly Bienenstock, NP   650 mg at 03/03/20 2259  . acidophilus (RISAQUAD) capsule 1 capsule  1 capsule Oral BID Ottie Glazier, MD      . albumin human 25 % solution 12.5 g  12.5 g Intravenous Daily Ottie Glazier, MD 60 mL/hr at 03/09/20 1000 12.5 g at 03/09/20 1000  . ceFAZolin (ANCEF) IVPB 2g/100 mL premix  2 g Intravenous Q8H Ravishankar, Jayashree, MD  200 mL/hr at 03/09/20 1330 2 g at 03/09/20 1330  . chlorhexidine (PERIDEX) 0.12 % solution 15 mL  15 mL Mouth Rinse BID Flora Lipps, MD   15 mL at 03/09/20 0926  . Chlorhexidine Gluconate Cloth 2 % PADS 6 each  6 each Topical Daily Bradly Bienenstock, NP   6 each at 03/08/20 1426  . enoxaparin (LOVENOX) injection 40 mg  40 mg Subcutaneous Q24H Aleskerov, Fuad, MD      . hydrocortisone sodium succinate (SOLU-CORTEF) 100 MG injection 50 mg  50 mg Intravenous Q12H Aleskerov, Fuad, MD      . insulin aspart (novoLOG) injection 0-15 Units  0-15 Units Subcutaneous Q4H Darel Hong D, NP   2 Units at 03/09/20 0148  . levothyroxine (SYNTHROID) tablet 100 mcg  100 mcg Oral Daily Darel Hong D, NP   100 mcg at 03/09/20 0610  . MEDLINE mouth rinse  15 mL Mouth Rinse q12n4p Flora Lipps, MD   15 mL at 03/09/20 1331  . midodrine (PROAMATINE) tablet 5 mg  5 mg Oral TID WC Aleskerov, Fuad, MD   5 mg at 03/09/20 1331  . morphine 2 MG/ML injection 1 mg  1 mg Intravenous Q2H PRN Flora Lipps, MD   1 mg at 03/09/20 0145  . multivitamin with minerals tablet 1 tablet  1 tablet Oral Daily Flora Lipps, MD   1 tablet at 03/09/20 0928  . ondansetron (ZOFRAN) injection 4 mg  4 mg Intravenous Q6H PRN Flora Lipps, MD      . potassium PHOSPHATE 30 mmol in dextrose 5 % 500 mL infusion  30 mmol Intravenous Once Ottie Glazier, MD 85 mL/hr at 03/09/20 1329 30 mmol at 03/09/20 1329  . sodium chloride flush (NS) 0.9 % injection 3 mL  3 mL Intravenous Q12H Flora Lipps, MD   3 mL at 03/08/20 2034  . sodium chloride flush (NS) 0.9 % injection 3 mL  3 mL Intravenous PRN Flora Lipps, MD      . tamsulosin (FLOMAX) capsule 0.4 mg  0.4 mg Oral Daily Darel Hong D, NP   0.4 mg at 03/08/20 1002     Discharge Medications: Please see discharge summary for a list of discharge medications.  Relevant Imaging Results:  Relevant Lab Results:   Additional Information SS#: [id number]  Candie Chroman, LCSW

## 2020-03-09 NOTE — Progress Notes (Signed)
Pt being transferred to 2A, no signs of distress; Pt on room air, foley in place, flexiseal in place, IV team arrived to evaluate for new PIV placement.  Central line remains in place for multiple IV medications, including potassium replacement and antibiotic administration.  Necessity will continue to be evaluated.

## 2020-03-09 NOTE — Progress Notes (Signed)
OT Cancellation Note  Patient Details Name: Joanne [name] MRN: [id number] DOB: 1971-09-15   Cancelled Treatment:    Reason Eval/Treat Not Completed: Medical issues which prohibited therapy  OT consult received and chart reviewed. Upon chart review, pt's K+ noted to be at 2.8 which falls outside therapy guidelines for activity. Will f/u for OT evaluation at later date/time as appropriate. Thank you.  Rejeana Brock, MS, OTR/L ascom [phone] 03/09/20, 3:31 PM

## 2020-03-09 NOTE — Progress Notes (Signed)
End of shift not. Pt is awake a, alert and oriented to self and place. Feed pt his dinner early on the shift and she ate 75% of meal and of juice. Prn morphine given x1 for gen body aches. Pt had a blow out liquid BM, replace flex seal and and flex draining well now. VSS. Pt off all pressures since 2000. Dressing change as ordered. Pt able to communicate needs, speech sometime hard to understand
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
## 2020-03-10 DIAGNOSIS — L89150 Pressure ulcer of sacral region, unstageable: Secondary | ICD-10-CM

## 2020-03-10 DIAGNOSIS — L89154 Pressure ulcer of sacral region, stage 4: Secondary | ICD-10-CM

## 2020-03-10 LAB — GLUCOSE, CAPILLARY
Glucose-Capillary: 53 mg/dL — ABNORMAL LOW (ref 70–99)
Glucose-Capillary: 60 mg/dL — ABNORMAL LOW (ref 70–99)
Glucose-Capillary: 70 mg/dL (ref 70–99)
Glucose-Capillary: 78 mg/dL (ref 70–99)
Glucose-Capillary: 78 mg/dL (ref 70–99)
Glucose-Capillary: 79 mg/dL (ref 70–99)
Glucose-Capillary: 81 mg/dL (ref 70–99)
Glucose-Capillary: 83 mg/dL (ref 70–99)
Glucose-Capillary: 89 mg/dL (ref 70–99)

## 2020-03-10 LAB — CBC WITH DIFFERENTIAL/PLATELET
Abs Immature Granulocytes: 1.42 10*3/uL — ABNORMAL HIGH (ref 0.00–0.07)
Basophils Absolute: 0.1 10*3/uL (ref 0.0–0.1)
Basophils Relative: 0 %
Eosinophils Absolute: 0 10*3/uL (ref 0.0–0.5)
Eosinophils Relative: 0 %
HCT: 22.9 % — ABNORMAL LOW (ref 36.0–46.0)
Hemoglobin: 7.6 g/dL — ABNORMAL LOW (ref 12.0–15.0)
Immature Granulocytes: 10 %
Lymphocytes Relative: 17 %
Lymphs Abs: 2.3 10*3/uL (ref 0.7–4.0)
MCH: 27.6 pg (ref 26.0–34.0)
MCHC: 33.2 g/dL (ref 30.0–36.0)
MCV: 83.3 fL (ref 80.0–100.0)
Monocytes Absolute: 1.1 10*3/uL — ABNORMAL HIGH (ref 0.1–1.0)
Monocytes Relative: 8 %
Neutro Abs: 9 10*3/uL — ABNORMAL HIGH (ref 1.7–7.7)
Neutrophils Relative %: 65 %
Platelets: 206 10*3/uL (ref 150–400)
RBC: 2.75 MIL/uL — ABNORMAL LOW (ref 3.87–5.11)
RDW: 14.6 % (ref 11.5–15.5)
Smear Review: NORMAL
WBC: 13.9 10*3/uL — ABNORMAL HIGH (ref 4.0–10.5)
nRBC: 0.4 % — ABNORMAL HIGH (ref 0.0–0.2)

## 2020-03-10 LAB — BASIC METABOLIC PANEL
Anion gap: 8 (ref 5–15)
BUN: 7 mg/dL (ref 6–20)
CO2: 22 mmol/L (ref 22–32)
Calcium: 7.7 mg/dL — ABNORMAL LOW (ref 8.9–10.3)
Chloride: 115 mmol/L — ABNORMAL HIGH (ref 98–111)
Creatinine, Ser: 0.53 mg/dL (ref 0.44–1.00)
GFR calc Af Amer: >60 mL/min (ref >60)
GFR calc non Af Amer: >60 mL/min (ref >60)
Glucose, Bld: 92 mg/dL (ref 70–99)
Potassium: 3.4 mmol/L — ABNORMAL LOW (ref 3.5–5.1)
Sodium: 145 mmol/L (ref 135–145)

## 2020-03-10 LAB — PHOSPHORUS: Phosphorus: 3 mg/dL (ref 2.5–4.6)

## 2020-03-10 LAB — MAGNESIUM: Magnesium: 2.3 mg/dL (ref 1.7–2.4)

## 2020-03-10 MED ORDER — INSULIN ASPART 100 UNIT/ML ~~LOC~~ SOLN: 0.0000 [IU] | SUBCUTANEOUS | Status: DC

## 2020-03-10 MED ORDER — POTASSIUM CHLORIDE CRYS ER 20 MEQ PO TBCR
40.0000 meq | EXTENDED_RELEASE_TABLET | Freq: Once | ORAL | Status: AC
  Administered 2020-03-10: 40 meq via ORAL
  Filled 2020-03-10: qty 2

## 2020-03-10 MED ORDER — ACETAMINOPHEN 650 MG RE SUPP: 650.0000 mg | RECTAL | Status: DC | PRN

## 2020-03-10 MED ORDER — MORPHINE SULFATE (PF) 2 MG/ML IV SOLN
2.0000 mg | INTRAVENOUS | Status: DC | PRN
  Administered 2020-03-11 – 2020-03-13 (×9): 2 mg via INTRAVENOUS
  Filled 2020-03-10 (×9): qty 1

## 2020-03-10 NOTE — Progress Notes (Signed)
Chart reviewed, Pt visited. Pt was eating pureed foods with nectar thickened liquids upon ST entering. PMSV currrently in place for meal, seated upright. Noted breathy voicing, effort needed to speak. Pt is tolerating current pureed foods, wants to eat independently as noted every time ST attempted to assist. Pt reported she wanted to try solid foods " I have teeth and need to use them". Extended time needed for Pt to bite and masticate the solid foods. Pt agreed to continue with current pureed diet for ease of chewing while at [facility]. Rec ST eval and treat once Pt is discharged back to nursing facility with possible upgraded back to diet prior to this hospitalization as facility will be more familiar with baseline abilities. Pt was very pleasant and engaging in conversation.

## 2020-03-10 NOTE — Progress Notes (Signed)
Hypoglycemic Event  CBG: 53  Treatment: 8oz juice  Symptoms: Asymptomatic  Follow-up CBG: Time:0025 CBG Result:60  Possible Reasons for Event:   Comments/MD notified:page sent to E. [place]    Joanne [name]

## 2020-03-10 NOTE — Progress Notes (Signed)
VSS. NSR on tele. Pain managed w/ prn medications. 2 episodes of hypoglycemia overnight. Both managed w/ hypoglycemia standing orders. Smart text progress notes as documented. NP notified. Awaiting new orders. Pt alert and able to communicate w/ speaking valve. Speech can be difficult to understand. Wounds dressed per orders. Extensive sacral wound. Dressing changed. Pt turned q2 hr. Rectal tube in place. Foley in place, urine output adequate. IV antibiotic given per order. Aspiration precautions maintained.

## 2020-03-10 NOTE — TOC Initial Note (Signed)
Transition of Care [facility]) - Initial/Assessment Note    Patient Details  Name: Joanne [name] MRN: [id number] Date of Birth: [birth year]  Transition of Care [place]) CM/SW Contact:    Maree Krabbe, LCSW Phone Number: 03/10/2020, 10:01 AM  Clinical Narrative:    CSW spoke with pt at bedside, however, pt was very confused and unable to answer most questions. CSW asked if she could reach out to pt's legal guardian--pt agreeable. CSW spoke with pt's legal guardian. LG confirmed pt is from [facility] as a long term resident. Plan is for pt to return there at d/c. Pt has all transportation through facility. Pt gets her meds through facility pharmacy.               Expected Discharge Plan: Skilled Nursing Facility Barriers to Discharge: Continued Medical Work up   Patient Goals and CMS Choice Patient states their goals for this hospitalization and ongoing recovery are:: to go back to facility CMS Medicare.gov Compare Post Acute Care list provided to:: Legal Guardian Choice offered to / list presented to : [facility] POA / Guardian  Expected Discharge Plan and Services Expected Discharge Plan: Skilled Nursing Facility In-house Referral: NA   Post Acute Care Choice: Skilled Nursing Facility Living arrangements for the past 2 months: Skilled Nursing Facility                                      Prior Living Arrangements/Services Living arrangements for the past 2 months: Skilled Nursing Facility Lives with:: Self Patient language and need for interpreter reviewed:: Yes        Need for Family Participation in Patient Care: Yes (Comment) Care giver support system in place?: Yes (comment)   Criminal Activity/Legal Involvement Pertinent to Current Situation/Hospitalization: No - Comment as needed  Activities of Daily Living      Permission Sought/Granted Permission sought to share information with : Oceanographer granted to share information with  : Yes, Verbal Permission Granted  Share Information with NAME: [name]  Permission granted to share info w AGENCY: [facility]  Permission granted to share info w Relationship: Legal Guardian     Emotional Assessment Appearance:: Appears stated age Attitude/Demeanor/Rapport: Engaged Affect (typically observed): Tearful/Crying, Sad Orientation: : Oriented to Self, Oriented to Place, Oriented to Situation Alcohol / Substance Use: Not Applicable Psych Involvement: No (comment)  Admission diagnosis:  Hypernatremia [E87.0] Septic shock (HCC) [A41.9, R65.21] Severe sepsis with acute organ dysfunction (HCC) [A41.9, R65.20] Patient Active Problem List   Diagnosis Date Noted  . Goals of care, counseling/discussion   . Palliative care by specialist   . DNR (do not resuscitate) discussion   . Septic shock (HCC) 03/02/2020  . Hypernatremia 11/15/2019  . Thrombocytopenia (HCC) 11/15/2019  . AKI (acute kidney injury) (HCC) 11/15/2019  . HTN (hypertension) 11/15/2019  . Non-insulin dependent type 2 diabetes mellitus (HCC) 11/15/2019  . Hypothyroidism 11/15/2019  . Severe sepsis with acute organ dysfunction (HCC) 11/15/2019  . Left hemiplegia (HCC) 11/15/2019  . Pressure injury of skin 11/15/2019  . Aspiration pneumonia due to gastric secretions (HCC)   . Acute kidney injury (AKI) with acute tubular necrosis (ATN) (HCC)   . Traumatic brain injury (HCC)   . Acute on chronic respiratory failure with hypoxia (HCC) 06/06/2018  . 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 Pneumonia due to Klebsiella pneumoniae (HCC) 06/06/2018  . Acute on chronic  combined systolic and diastolic CHF (congestive heart failure) ([place]) 06/06/2018  . Sepsis secondary to UTI ([place]) 06/06/2018  . ARDS (adult respiratory distress syndrome) ([place]) 06/06/2018  . Acute on chronic respiratory failure with hypoxia ([place]) 06/06/2018  . Tracheostomy status ([place]) 06/06/2018   PCP:  Patient, No Pcp Per Pharmacy:  No  Pharmacies Listed    Social Determinants of Health (SDOH) Interventions    Readmission Risk Interventions Readmission Risk Prevention Plan 03/10/2020 03/03/2020  Transportation Screening Complete Complete  PCP or Specialist Appt within 3-5 Days Complete Complete  HRI or [place] Complete Complete  Social Work Consult for [place] Planning/Counseling Complete -  Palliative Care Screening Not Applicable Complete  Medication Review Press photographer) Complete Complete  Some recent data might be hidden

## 2020-03-10 NOTE — Care Management Important Message (Signed)
Important Message  Patient Details  Name: Joanne [name] MRN: [id number] Date of Birth: 01/03/1971   Medicare Important Message Given:  Yes  Reviewed with legal guardian, [name], at [phone].  Copy of Medicare IM sent securely to email address provided: [email or web address].  Copy also left in patient's room for reference.   [name] 03/10/2020, 11:17 AM

## 2020-03-10 NOTE — Progress Notes (Signed)
Inpatient Diabetes Program Recommendations  AACE/ADA: New Consensus Statement on Inpatient Glycemic Control   Target Ranges:  Prepandial:   less than 140 mg/dL      Peak postprandial:   less than 180 mg/dL (1-2 hours)      Critically ill patients:  140 - 180 mg/dL   Results for MAYLEE, BARE (MRN [id number]) as of 03/10/2020 11:11  Ref. Range 03/10/2020 00:02 03/10/2020 00:25 03/10/2020 00:44 03/10/2020 03:58 03/10/2020 07:18  Glucose-Capillary Latest Ref Range: 70 - 99 mg/dL 53 (L) 60 (L) 78 89 83  Results for PRANATHI, WINFREE (MRN [id number]) as of 03/10/2020 11:11  Ref. Range 03/09/2020 07:32 03/09/2020 11:16 03/09/2020 13:01 03/09/2020 16:42 03/09/2020 20:25 03/09/2020 20:47  Glucose-Capillary Latest Ref Range: 70 - 99 mg/dL 92 94 944 (H) 967 (H)  Novolog 2 units 50 (L) 65 (L)   Review of Glycemic Control  Diabetes history: DM2 Outpatient Diabetes medications: None Current orders for Inpatient glycemic control: Novolog 0-15 units Q4H; Solucortef 50 mg Q12H  Inpatient Diabetes Program Recommendations:   Correction (SSI): Please decrease Novolog correction to very sensitive scale 0-6 units Q4H.  Thanks, Orlando Penner, RN, MSN, CDE Diabetes Coordinator Inpatient Diabetes Program [phone] (Team Pager from 8am to 5pm)

## 2020-03-10 NOTE — Progress Notes (Signed)
Hypoglycemic Event  CBG: 65   Treatment: 8oz juice  Symptoms: asymptomatic  Follow-up CBG: Time:2113 CBG Result:77  Possible Reasons for Event:   Comments/MD notified:    Milburn Freeney L Henreitta Leber

## 2020-03-10 NOTE — [place] FL2 (Signed)
[place] MEDICAID FL2 LEVEL OF CARE SCREENING TOOL     IDENTIFICATION  Patient Name: Joanne [name] Birthdate: 1970-12-14 Sex: female Admission Date (Current Location): 03/02/2020  [place] Number:  Chiropodist and Address:  [facility], [address] [place], [place] [address]      Provider Number: [id number]  Attending Physician Name and Address:  Tresa Moore, MD  Relative Name and Phone Number:       Current Level of Care: Hospital Recommended Level of Care: Skilled Nursing Facility Prior Approval Number:    Date Approved/Denied:   PASRR Number: [id number]  Discharge Plan: SNF    Current Diagnoses: Patient Active Problem List   Diagnosis Date Noted  . Goals of care, counseling/discussion   . Palliative care by specialist   . DNR (do not resuscitate) discussion   . Septic shock (HCC) 03/02/2020  . Hypernatremia 11/15/2019  . Thrombocytopenia (HCC) 11/15/2019  . AKI (acute kidney injury) (HCC) 11/15/2019  . HTN (hypertension) 11/15/2019  . Non-insulin dependent type 2 diabetes mellitus (HCC) 11/15/2019  . Hypothyroidism 11/15/2019  . Severe sepsis with acute organ dysfunction (HCC) 11/15/2019  . Left hemiplegia (HCC) 11/15/2019  . Pressure injury of skin 11/15/2019  . Aspiration pneumonia due to gastric secretions (HCC)   . Acute kidney injury (AKI) with acute tubular necrosis (ATN) (HCC)   . Traumatic brain injury (HCC)   . Acute on chronic respiratory failure with hypoxia (HCC) 06/06/2018  . 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 Pneumonia due to Klebsiella pneumoniae (HCC) 06/06/2018  . Acute on chronic combined systolic and diastolic CHF (congestive heart failure) (HCC) 06/06/2018  . Sepsis secondary to UTI (HCC) 06/06/2018  . ARDS (adult respiratory distress syndrome) (HCC) 06/06/2018  . Acute on chronic respiratory failure with hypoxia (HCC) 06/06/2018  . 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     Orientation RESPIRATION BLADDER Height & Weight     Self, Situation, Place  Normal Indwelling catheter, Incontinent(urethal cath placed 5/17) Weight: 144 lb 11.2 oz (65.6 kg) Height:  5\' 7"  (170.2 cm)  BEHAVIORAL SYMPTOMS/MOOD NEUROLOGICAL BOWEL NUTRITION STATUS  (Calm, flat affect.) (None) Incontinent(rectal tube placed 5/16) Diet(DYS 1 diet, nectar thick)  AMBULATORY STATUS COMMUNICATION OF NEEDS Skin   Extensive Assist Verbally PU Stage and Appropriate Care(unstageable on heel, foam dressing)   PU Stage 2 Dressing: (located on thigh, foam dressing, change twice a day) PU Stage 3 Dressing: (located on coccyx, foam dressing, change twice a day)                 Personal Care Assistance Level of Assistance  Bathing, Dressing, Feeding Bathing Assistance: Maximum assistance Feeding assistance: Limited assistance Dressing Assistance: Maximum assistance     Functional Limitations Info  Sight, Hearing, Speech Sight Info: Adequate Hearing Info: Adequate Speech Info: Adequate    SPECIAL CARE FACTORS FREQUENCY  PT (By licensed PT), OT (By licensed OT)     PT Frequency: 5x OT Frequency: 5x            Contractures Contractures Info: Not present    Additional Factors Info  Code Status, Allergies Code Status Info: Full Code Allergies Info: Kaopectate  (Attapulgite), Thiopental, Tomato           Current Medications (03/10/2020):  This is the current hospital active medication list Current Facility-Administered Medications  Medication Dose Route Frequency Provider Last Rate Last Admin  . 0.9 %  sodium chloride infusion  250 mL Intravenous PRN 03/12/2020, MD  10 mL/hr at 03/09/20 1300 Rate Verify at 03/09/20 1300  . acetaminophen (TYLENOL) suppository 650 mg  650 mg Rectal Q4H PRN Oswald Hillock, RPH      . acidophilus (RISAQUAD) capsule 1 capsule  1 capsule Oral BID Ottie Glazier, MD   1 capsule at 03/10/20 0824  . ceFAZolin (ANCEF) IVPB 2g/100 mL premix  2 g  Intravenous Q8H Ravishankar, Joellyn Quails, MD 200 mL/hr at 03/10/20 0512 2 g at 03/10/20 0512  . chlorhexidine (PERIDEX) 0.12 % solution 15 mL  15 mL Mouth Rinse BID Flora Lipps, MD   15 mL at 03/10/20 0836  . Chlorhexidine Gluconate Cloth 2 % PADS 6 each  6 each Topical Daily Bradly Bienenstock, NP   6 each at 03/09/20 1511  . enoxaparin (LOVENOX) injection 40 mg  40 mg Subcutaneous Q24H Ottie Glazier, MD   40 mg at 03/09/20 2133  . hydrocortisone sodium succinate (SOLU-CORTEF) 100 MG injection 50 mg  50 mg Intravenous Q12H Ottie Glazier, MD   50 mg at 03/10/20 7619  . insulin aspart (novoLOG) injection 0-15 Units  0-15 Units Subcutaneous Q4H Darel Hong D, NP   2 Units at 03/09/20 1714  . levothyroxine (SYNTHROID) tablet 100 mcg  100 mcg Oral Daily Darel Hong D, NP   100 mcg at 03/10/20 0516  . MEDLINE mouth rinse  15 mL Mouth Rinse q12n4p Flora Lipps, MD   15 mL at 03/09/20 1331  . midodrine (PROAMATINE) tablet 5 mg  5 mg Oral TID WC Aleskerov, Fuad, MD   5 mg at 03/10/20 0827  . morphine 2 MG/ML injection 1 mg  1 mg Intravenous Q2H PRN Flora Lipps, MD   1 mg at 03/10/20 0823  . multivitamin with minerals tablet 1 tablet  1 tablet Oral Daily Flora Lipps, MD   1 tablet at 03/10/20 0828  . ondansetron (ZOFRAN) injection 4 mg  4 mg Intravenous Q6H PRN Flora Lipps, MD      . sodium chloride flush (NS) 0.9 % injection 3 mL  3 mL Intravenous Q12H Flora Lipps, MD   3 mL at 03/09/20 2131  . sodium chloride flush (NS) 0.9 % injection 3 mL  3 mL Intravenous PRN Flora Lipps, MD      . tamsulosin (FLOMAX) capsule 0.4 mg  0.4 mg Oral Daily Darel Hong D, NP   0.4 mg at 03/10/20 5093     Discharge Medications: Please see discharge summary for a list of discharge medications.  Relevant Imaging Results:  Relevant Lab Results:   Additional Information SSN [id number]  Eileen Stanford, LCSW

## 2020-03-10 NOTE — Progress Notes (Signed)
ID  Pt doing better Alert and verbal TBI Flexion contractures Tracheostomy Patient Vitals for the past 24 hrs:  BP Temp Temp src Pulse Resp SpO2 Weight  03/10/20 1631 91/62 98 F (36.7 C) Oral 72 17 96 % --  03/10/20 1119 99/67 98.5 F (36.9 C) Oral 68 17 97 % --  03/10/20 0716 (!) 89/63 98.1 F (36.7 C) Oral 65 15 91 % --  03/10/20 0532 99/67 98.1 F (36.7 C) Oral 79 -- 100 % 65.6 kg  03/09/20 2004 96/74 98.6 F (37 C) Oral 74 -- 93 % --  03/09/20 1835 -- -- -- -- -- -- 65.9 kg  03/09/20 1829 99/68 97.6 F (36.4 C) Oral 80 18 100 % --   Chest b/l air entry  HS s1s2 abd soft Sacral wound - tunneling and bone felt Packed with aquacel pieces as per wound nurse and one of the piece has migrated down inside Rt femoral catheter removed Foley present B/l gluteal/upper thigh wounds are superficial and not infected  Labs CBC Latest Ref Rng & Units 03/10/2020 03/09/2020 03/08/2020  WBC 4.0 - 10.5 K/uL 13.9(H) 11.5(H) 12.0(H)  Hemoglobin 12.0 - 15.0 g/dL 7.6(L) 7.5(L) 7.9(L)  Hematocrit 36.0 - 46.0 % 22.9(L) 22.9(L) 22.9(L)  Platelets 150 - 400 K/uL 206 154 143(L)    CMP Latest Ref Rng & Units 03/10/2020 03/09/2020 03/08/2020  Glucose 70 - 99 mg/dL 92 749(S) 496(P)  BUN 6 - 20 mg/dL 7 9 12   Creatinine 0.44 - 1.00 mg/dL 5.91 6.38  Sodium 135 - 145 mmol/L 145 138 132(L)  Potassium 3.5 - 5.1 mmol/L 3.4(L) 2.8(L) 2.6(LL)  Chloride 98 - 111 mmol/L 115(H) 110 101  CO2 22 - 32 mmol/L 22 23 23   Calcium 8.9 - 10.3 mg/dL 7.7(L) 7.5(L) 7.2(L)  Total Protein 6.5 - 8.1 g/dL - - -  Total Bilirubin 0.3 - 1.2 mg/dL - - -  Alkaline Phos 38 - 126 U/L - - -  AST 15 - 41 U/L - - -  ALT 0 - 44 U/L - - -   Impression/recommendation Group B streptococcus bacteremia +E. coli bacteremia with septic shock-resolved Pt currently on cefazolin day day 8 of appropriate antibiotic  E. coli UTI  Sacral wound stage IV- need surgical consult and also wound care nurse to re-evaulate the patient  -piece of aquacel has migrated into the wound     Diarrhea- antibiotic related much improved after stopping zosyn + clinda    Anemia   TBI -  Pt seen with stephanie her nurse Discussed the management with the patient, her nurse and hospitalist

## 2020-03-10 NOTE — Progress Notes (Signed)
Hypoglycemic Event  CBG: 60  Treatment: 8oz juice  Symptoms: asymptomatic  Follow-up CBG: Time:0044 CBG Result:78  Possible Reasons for Event:   Comments/MD notified:page sent to E. Ouma    Layani Foronda L Henreitta Leber

## 2020-03-10 NOTE — Progress Notes (Signed)
Patient wound care completed by this RN and Dr. Rivka Safer.  Coccyx wound noted to be tunneling and stage 4. Wound packed with iodoform per MD orders.
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
## 2020-03-10 NOTE — Progress Notes (Addendum)
PHARMACY CONSULT NOTE  Pharmacy Consult for Electrolyte Monitoring and Replacement   Recent Labs: Potassium (mmol/L)  Date Value  03/10/2020 3.4 (L)   Magnesium (mg/dL)  Date Value  96/08/6434 2.3   Calcium (mg/dL)  Date Value  39/09/2582 7.7 (L)   Albumin (g/dL)  Date Value  46/21/9471 2.3 (L)   Phosphorus (mg/dL)  Date Value  25/27/1292 3.0   Sodium (mmol/L)  Date Value  03/10/2020 145   Corrected Ca: 9.1 mg/dL  Assessment: 49 year old female presented with altered mental status. Patient with h/o TBI with tracheostomy. Patient with strep pyogenes and ecoli bacteremia. She has sacral and left heel decubitus ulcers as well as a wound to the right posterior leg. Pharmacy to manage electrolytes. Electrolytes low, concerning for refeeding.  Goal of Therapy:  Electrolytes WNL: K ~4 Mg ~2  Plan:  Will give KCl 40 mEq PO x 1  Will continue to monitor with AM labs.   Ronnald Ramp ,PharmD, BCPS Clinical Pharmacist 03/10/2020 7:43 AM
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
## 2020-03-10 NOTE — Progress Notes (Signed)
Hypoglycemic Event  CBG: 50   Treatment: 8oz juice  Symptoms: asymptomatic  Follow-up CBG: Time:2047 CBG Result:65   Possible Reasons for Event:   Comments/MD notified:    Marilla Boddy L Henreitta Leber

## 2020-03-10 NOTE — Progress Notes (Signed)
TLC in R femoral removed at this time.  Removed by this RN and Thompson Grayer RN.  Catheter removed with tip intact.  Dry sterile dressing applied.

## 2020-03-11 LAB — CBC WITH DIFFERENTIAL/PLATELET
Abs Immature Granulocytes: 0.76 10*3/uL — ABNORMAL HIGH (ref 0.00–0.07)
Basophils Absolute: 0 10*3/uL (ref 0.0–0.1)
Basophils Relative: 0 %
Eosinophils Absolute: 0 10*3/uL (ref 0.0–0.5)
Eosinophils Relative: 0 %
HCT: 24.3 % — ABNORMAL LOW (ref 36.0–46.0)
Hemoglobin: 7.9 g/dL — ABNORMAL LOW (ref 12.0–15.0)
Immature Granulocytes: 6 %
Lymphocytes Relative: 21 %
Lymphs Abs: 2.5 10*3/uL (ref 0.7–4.0)
MCH: 27.4 pg (ref 26.0–34.0)
MCHC: 32.5 g/dL (ref 30.0–36.0)
MCV: 84.4 fL (ref 80.0–100.0)
Monocytes Absolute: 0.9 10*3/uL (ref 0.1–1.0)
Monocytes Relative: 8 %
Neutro Abs: 7.8 10*3/uL — ABNORMAL HIGH (ref 1.7–7.7)
Neutrophils Relative %: 65 %
Platelets: 236 10*3/uL (ref 150–400)
RBC: 2.88 MIL/uL — ABNORMAL LOW (ref 3.87–5.11)
RDW: 15.1 % (ref 11.5–15.5)
Smear Review: NORMAL
WBC: 12 10*3/uL — ABNORMAL HIGH (ref 4.0–10.5)
nRBC: 0.6 % — ABNORMAL HIGH (ref 0.0–0.2)

## 2020-03-11 LAB — GLUCOSE, CAPILLARY
Glucose-Capillary: 87 mg/dL (ref 70–99)
Glucose-Capillary: 97 mg/dL (ref 70–99)
Glucose-Capillary: 87 mg/dL (ref 70–99)
Glucose-Capillary: 104 mg/dL — ABNORMAL HIGH (ref 70–99)
Glucose-Capillary: 96 mg/dL (ref 70–99)

## 2020-03-11 LAB — MAGNESIUM: Magnesium: 2 mg/dL (ref 1.7–2.4)

## 2020-03-11 LAB — PHOSPHORUS: Phosphorus: 2.3 mg/dL — ABNORMAL LOW (ref 2.5–4.6)

## 2020-03-11 MED ORDER — K PHOS MONO-SOD PHOS DI & MONO 155-852-130 MG PO TABS
500.0000 mg | ORAL_TABLET | ORAL | Status: AC
  Administered 2020-03-11 (×2): 500 mg via ORAL
  Filled 2020-03-11 (×2): qty 2

## 2020-03-11 MED ORDER — ASCORBIC ACID 500 MG PO TABS
250.0000 mg | ORAL_TABLET | Freq: Two times a day (BID) | ORAL | Status: DC
  Administered 2020-03-11 – 2020-03-16 (×10): 250 mg via ORAL
  Filled 2020-03-11 (×10): qty 1
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
## 2020-03-11 NOTE — Progress Notes (Signed)
Patient foley d/c'd at 1130. Patient without urine output, bladder scan completed at this time and bladder scan showed of urine in bladder. patient without complaints at this time or feeling the urge to urinate.
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
## 2020-03-12 DIAGNOSIS — A400 Sepsis due to streptococcus, group A: Principal | ICD-10-CM

## 2020-03-12 LAB — BASIC METABOLIC PANEL
Anion gap: 7 (ref 5–15)
BUN: 7 mg/dL (ref 6–20)
CO2: 25 mmol/L (ref 22–32)
Calcium: 7.8 mg/dL — ABNORMAL LOW (ref 8.9–10.3)
Chloride: 115 mmol/L — ABNORMAL HIGH (ref 98–111)
Creatinine, Ser: 0.59 mg/dL (ref 0.44–1.00)
GFR calc Af Amer: >60 mL/min (ref >60)
GFR calc non Af Amer: >60 mL/min (ref >60)
Glucose, Bld: 91 mg/dL (ref 70–99)
Potassium: 3.4 mmol/L — ABNORMAL LOW (ref 3.5–5.1)
Sodium: 147 mmol/L — ABNORMAL HIGH (ref 135–145)

## 2020-03-12 LAB — CBC WITH DIFFERENTIAL/PLATELET
Abs Immature Granulocytes: 0.3 10*3/uL — ABNORMAL HIGH (ref 0.00–0.07)
Basophils Absolute: 0 10*3/uL (ref 0.0–0.1)
Basophils Relative: 0 %
Eosinophils Absolute: 0.2 10*3/uL (ref 0.0–0.5)
Eosinophils Relative: 2 %
HCT: 24.8 % — ABNORMAL LOW (ref 36.0–46.0)
Hemoglobin: 7.9 g/dL — ABNORMAL LOW (ref 12.0–15.0)
Immature Granulocytes: 3 %
Lymphocytes Relative: 33 %
Lymphs Abs: 3.5 10*3/uL (ref 0.7–4.0)
MCH: 27.4 pg (ref 26.0–34.0)
MCHC: 31.9 g/dL (ref 30.0–36.0)
MCV: 86.1 fL (ref 80.0–100.0)
Monocytes Absolute: 0.9 10*3/uL (ref 0.1–1.0)
Monocytes Relative: 8 %
Neutro Abs: 5.9 10*3/uL (ref 1.7–7.7)
Neutrophils Relative %: 54 %
Platelets: 267 10*3/uL (ref 150–400)
RBC: 2.88 MIL/uL — ABNORMAL LOW (ref 3.87–5.11)
RDW: 16.3 % — ABNORMAL HIGH (ref 11.5–15.5)
WBC: 10.8 10*3/uL — ABNORMAL HIGH (ref 4.0–10.5)
nRBC: 0.4 % — ABNORMAL HIGH (ref 0.0–0.2)

## 2020-03-12 LAB — GLUCOSE, CAPILLARY
Glucose-Capillary: 114 mg/dL — ABNORMAL HIGH (ref 70–99)
Glucose-Capillary: 115 mg/dL — ABNORMAL HIGH (ref 70–99)
Glucose-Capillary: 70 mg/dL (ref 70–99)
Glucose-Capillary: 73 mg/dL (ref 70–99)
Glucose-Capillary: 78 mg/dL (ref 70–99)
Glucose-Capillary: 87 mg/dL (ref 70–99)
Glucose-Capillary: 97 mg/dL (ref 70–99)

## 2020-03-12 LAB — PHOSPHORUS: Phosphorus: 3.4 mg/dL (ref 2.5–4.6)

## 2020-03-12 LAB — MAGNESIUM: Magnesium: 1.9 mg/dL (ref 1.7–2.4)

## 2020-03-12 MED ORDER — POTASSIUM CHLORIDE CRYS ER 20 MEQ PO TBCR
40.0000 meq | EXTENDED_RELEASE_TABLET | Freq: Once | ORAL | Status: AC
  Administered 2020-03-12: 40 meq via ORAL
  Filled 2020-03-12: qty 2

## 2020-03-12 MED ORDER — MAGNESIUM SULFATE 2 GM/50ML IV SOLN
2.0000 g | Freq: Once | INTRAVENOUS | Status: AC
  Administered 2020-03-12: 2 g via INTRAVENOUS
  Filled 2020-03-12: qty 50

## 2020-03-12 NOTE — Progress Notes (Signed)
PHARMACY CONSULT NOTE  Pharmacy Consult for Electrolyte Monitoring and Replacement   Recent Labs: Potassium (mmol/L)  Date Value  03/12/2020 3.4 (L)   Magnesium (mg/dL)  Date Value  95/36/9223 1.9   Calcium (mg/dL)  Date Value  00/97/9499 7.8 (L)   Albumin (g/dL)  Date Value  71/82/0990 2.3 (L)   Phosphorus (mg/dL)  Date Value  68/93/4068 3.4   Sodium (mmol/L)  Date Value  03/12/2020 147 (H)   Corrected Ca: 9.1 mg/dL  Assessment: 49 year old female presented with altered mental status. Patient with h/o TBI with tracheostomy. Patient with strep pyogenes and ecoli bacteremia. She has sacral and left heel decubitus ulcers as well as a wound to the right posterior leg. Pharmacy to manage electrolytes. Electrolytes low, concerning for refeeding.  Goal of Therapy:  Electrolytes WNL: K ~4 Mg ~2  Plan:  Will give KCl 40 mEq x 1 PO and Mg 2g IV x 1.    Will continue to monitor with AM labs.   Ronnald Ramp ,PharmD, BCPS Clinical Pharmacist 03/12/2020 7:27 AM

## 2020-03-12 NOTE — Progress Notes (Signed)
ID  Pt drowsy today On talking to her nurse and looking at [facility] she has received 8 mg of morphine in 12 hours  BP 94/69 (BP Location: Right Arm)   Pulse 91   Temp 97.8 F (36.6 C) (Oral)   Resp 20   Ht 5\' 7"  (1.702 m)   Wt 65.5 kg   LMP  (LMP Unknown)   SpO2 100%   BMI 22.62 kg/m    CBC Latest Ref Rng & Units 03/12/2020 03/11/2020 03/10/2020  WBC 4.0 - 10.5 K/uL 10.8(H) 12.0(H) 13.9(H)  Hemoglobin 12.0 - 15.0 g/dL 7.9(L) 7.9(L) 7.6(L)  Hematocrit 36.0 - 46.0 % 24.8(L) 24.3(L) 22.9(L)  Platelets 150 - 400 K/uL 267 236 206    CMP Latest Ref Rng & Units 03/12/2020 03/10/2020 03/09/2020  Glucose 70 - 99 mg/dL 91 92 03/11/2020)  BUN 6 - 20 mg/dL 7 7 9   Creatinine 0.44 - 1.00 mg/dL 403(K 7.42  Sodium 135 - 145 mmol/L 147(H) 145 138  Potassium 3.5 - 5.1 mmol/L 3.4(L) 3.4(L) 2.8(L)  Chloride 98 - 111 mmol/L 115(H) 115(H) 110  CO2 22 - 32 mmol/L 25 22 23   Calcium 8.9 - 10.3 mg/dL 7.8(L) 7.7(L) 7.5(L)  Total Protein 6.5 - 8.1 g/dL - - -  Total Bilirubin 0.3 - 1.2 mg/dL - - -  Alkaline Phos 38 - 126 U/L - - -  AST 15 - 41 U/L - - -  ALT 0 - 44 U/L - - -     Impression/Recommendation  Group A streptococcus bacteremia with septic shock- resolved. ( Not Group B strep as documented  erroneously in some of my progress notes )  E.coli bacteremia  On day 10 of Iv antibiotic  Urine culture e.coli + proteus  Sacral wound- stage IV- pan sensitive proteus in the wound  Anemia     TBI Pt drowsy from too much morphine- discussed with her nurse .
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
## 2020-03-12 NOTE — Progress Notes (Signed)
Daily Progress Note   Patient Name: Joanne [name]       Date: 03/12/2020 DOB: September 09, 1971  Age: 49 y.o. MRN#: [id number] Attending Physician: Tresa Moore, MD Primary Care Physician: Patient, No Pcp Per Admit Date: 03/02/2020  Reason for Consultation/Follow-up: Establishing goals of care  Subjective: Patient is sitting in bed. She has PMV in place. She has eaten lunch. She states she is happy with her care and wants to continue all care possible to live.   Length of Stay: 10  Current Medications: Scheduled Meds:  . acidophilus  1 capsule Oral BID  . vitamin C  250 mg Oral BID  . chlorhexidine  15 mL Mouth Rinse BID  . Chlorhexidine Gluconate Cloth  6 each Topical Daily  . enoxaparin (LOVENOX) injection  40 mg Subcutaneous Q24H  . insulin aspart  0-6 Units Subcutaneous Q4H  . levothyroxine  100 mcg Oral Daily  . mouth rinse  15 mL Mouth Rinse q12n4p  . midodrine  5 mg Oral TID WC  . multivitamin with minerals  1 tablet Oral Daily  . sodium chloride flush  3 mL Intravenous Q12H  . tamsulosin  0.4 mg Oral Daily    Continuous Infusions: . sodium chloride Stopped (03/11/20 1728)  .  ceFAZolin (ANCEF) IV Stopped (03/12/20 0617)    PRN Meds: sodium chloride, acetaminophen, morphine injection, ondansetron (ZOFRAN) IV, sodium chloride flush  Physical Exam Pulmonary:     Comments: Trach in place.  Neurological:     Mental Status: She is alert.             Vital Signs: BP 119/79 (BP Location: Right Arm)   Pulse (!) 115   Temp 98.4 F (36.9 C) (Oral)   Resp 17   Ht 5\' 7"  (1.702 m)   Wt 65.5 kg   LMP  (LMP Unknown)   SpO2 99%   BMI 22.62 kg/m  SpO2: SpO2: 99 % O2 Device: O2 Device: Room Air O2 Flow Rate: O2 Flow Rate (L/min): 2 L/min  Intake/output summary:    Intake/Output Summary (Last 24 hours) at 03/12/2020 1443 Last data filed at 03/12/2020 1046 Gross per 24 hour  Intake 1006.29 ml  Output 500 ml  Net 506.29 ml   LBM: Last BM Date: 03/11/20 Baseline Weight: Weight: 79.4 kg Most recent weight: Weight:  65.5 kg       Palliative Assessment/Data:    Flowsheet Rows     Most Recent Value  Intake Tab  Referral Department  Critical care  Unit at Time of Referral  ICU  Palliative Care Primary Diagnosis  Sepsis/Infectious Disease  Date Notified  03/03/20  Palliative Care Type  New Palliative care  Reason for referral  Clarify Goals of Care  Date of Admission  03/02/20  Date first seen by Palliative Care  03/03/20  # of days Palliative referral response time  0 Day(s)  # of days IP prior to Palliative referral  1  Clinical Assessment  Palliative Performance Scale Score  10%  Pain Max last 24 hours  Not able to report  Pain Min Last 24 hours  Not able to report  Dyspnea Max Last 24 Hours  Not able to report  Dyspnea Min Last 24 hours  Not able to report  Psychosocial & Spiritual Assessment  Palliative Care Outcomes      Patient Active Problem List   Diagnosis Date Noted  . Unstageable pressure ulcer of sacral region ([place]) 03/10/2020  . Goals of care, counseling/discussion   . Palliative care by specialist   . DNR (do not resuscitate) discussion   . Septic shock ([place]) 03/02/2020  . Hypernatremia 11/15/2019  . Thrombocytopenia ([place]) 11/15/2019  . AKI (acute kidney injury) ([place]) 11/15/2019  . HTN (hypertension) 11/15/2019  . Non-insulin dependent type 2 diabetes mellitus ([place]) 11/15/2019  . Hypothyroidism 11/15/2019  . Severe sepsis with acute organ dysfunction ([place]) 11/15/2019  . Left hemiplegia ([place]) 11/15/2019  . Pressure injury of skin 11/15/2019  . Aspiration pneumonia due to gastric secretions ([place])   . Acute kidney injury (AKI) with acute tubular necrosis (ATN) (HCC)   . Traumatic brain injury ([place])   . Acute on  chronic respiratory failure with hypoxia ([place]) 06/06/2018  . Tracheostomy status ([place]) 06/06/2018  . Pneumonia due to Klebsiella pneumoniae ([place]) 06/06/2018  . Acute on chronic combined systolic and diastolic CHF (congestive heart failure) ([place]) 06/06/2018  . Sepsis secondary to UTI ([place]) 06/06/2018  . ARDS (adult respiratory distress syndrome) ([place]) 06/06/2018  . Acute on chronic respiratory failure with hypoxia ([place]) 06/06/2018  . Tracheostomy status ([place]) 06/06/2018    Palliative Care Assessment & Plan     Recommendations/Plan: Recommend palliative at D/C.    Code Status:    Code Status Orders  (From admission, onward)         Start     Ordered   03/02/20 1718  Full code  Continuous     03/02/20 1720        Code Status History    Date Active Date Inactive Code Status Order ID Comments User Context   11/15/2019 0253 11/21/2019 0411 Full Code [id number]  Athena Masse, MD ED   09/10/2019 1522 10/16/2019 1653 Full Code [id number]  Tommie Ard Inpatient   Advance Care Planning Activity      Prognosis:  Unable to determine Greater than 50%  of this time was spent counseling and coordinating care related to the above assessment and plan.  Thank you for allowing the Palliative Medicine Team to assist in the care of this patient.   Total Time 15 min Prolonged Time Billed  no        Asencion Gowda, NP  Please contact Palliative Medicine Team phone at [phone] for questions and concerns.

## 2020-03-13 LAB — CBC WITH DIFFERENTIAL/PLATELET
Abs Immature Granulocytes: 0.11 10*3/uL — ABNORMAL HIGH (ref 0.00–0.07)
Basophils Absolute: 0 10*3/uL (ref 0.0–0.1)
Basophils Relative: 0 %
Eosinophils Absolute: 0.2 10*3/uL (ref 0.0–0.5)
Eosinophils Relative: 2 %
HCT: 28.9 % — ABNORMAL LOW (ref 36.0–46.0)
Hemoglobin: 9 g/dL — ABNORMAL LOW (ref 12.0–15.0)
Immature Granulocytes: 1 %
Lymphocytes Relative: 24 %
Lymphs Abs: 2.6 10*3/uL (ref 0.7–4.0)
MCH: 27.7 pg (ref 26.0–34.0)
MCHC: 31.1 g/dL (ref 30.0–36.0)
MCV: 88.9 fL (ref 80.0–100.0)
Monocytes Absolute: 0.8 10*3/uL (ref 0.1–1.0)
Monocytes Relative: 7 %
Neutro Abs: 7.3 10*3/uL (ref 1.7–7.7)
Neutrophils Relative %: 66 %
Platelets: 333 10*3/uL (ref 150–400)
RBC: 3.25 MIL/uL — ABNORMAL LOW (ref 3.87–5.11)
RDW: 17.2 % — ABNORMAL HIGH (ref 11.5–15.5)
WBC: 11 10*3/uL — ABNORMAL HIGH (ref 4.0–10.5)
nRBC: 0 % (ref 0.0–0.2)

## 2020-03-13 LAB — BASIC METABOLIC PANEL
Anion gap: 6 (ref 5–15)
BUN: 8 mg/dL (ref 6–20)
CO2: 27 mmol/L (ref 22–32)
Calcium: 8 mg/dL — ABNORMAL LOW (ref 8.9–10.3)
Chloride: 108 mmol/L (ref 98–111)
Creatinine, Ser: 0.54 mg/dL (ref 0.44–1.00)
GFR calc Af Amer: >60 mL/min (ref >60)
GFR calc non Af Amer: >60 mL/min (ref >60)
Glucose, Bld: 80 mg/dL (ref 70–99)
Potassium: 4.4 mmol/L (ref 3.5–5.1)
Sodium: 141 mmol/L (ref 135–145)

## 2020-03-13 LAB — GLUCOSE, CAPILLARY
Glucose-Capillary: 105 mg/dL — ABNORMAL HIGH (ref 70–99)
Glucose-Capillary: 71 mg/dL (ref 70–99)
Glucose-Capillary: 75 mg/dL (ref 70–99)
Glucose-Capillary: 77 mg/dL (ref 70–99)
Glucose-Capillary: 85 mg/dL (ref 70–99)
Glucose-Capillary: 98 mg/dL (ref 70–99)

## 2020-03-13 LAB — PHOSPHORUS: Phosphorus: 3.2 mg/dL (ref 2.5–4.6)

## 2020-03-13 LAB — MAGNESIUM: Magnesium: 2.2 mg/dL (ref 1.7–2.4)

## 2020-03-13 MED ORDER — GABAPENTIN 300 MG PO CAPS
300.0000 mg | ORAL_CAPSULE | Freq: Three times a day (TID) | ORAL | Status: DC
  Administered 2020-03-13 – 2020-03-16 (×10): 300 mg via ORAL
  Filled 2020-03-13 (×10): qty 1

## 2020-03-13 MED ORDER — MORPHINE SULFATE (PF) 2 MG/ML IV SOLN
2.0000 mg | Freq: Four times a day (QID) | INTRAVENOUS | Status: DC | PRN
  Administered 2020-03-13: 2 mg via INTRAVENOUS
  Filled 2020-03-13 (×2): qty 1

## 2020-03-13 NOTE — Progress Notes (Signed)
Attempted trach care, pt refusing trach care. Pt stated she will do it herself

## 2020-03-13 NOTE — Progress Notes (Signed)
PHARMACY CONSULT NOTE  Pharmacy Consult for Electrolyte Monitoring and Replacement   Recent Labs: Potassium (mmol/L)  Date Value  03/13/2020 4.4   Magnesium (mg/dL)  Date Value  67/10/4101 2.2   Calcium (mg/dL)  Date Value  11/22/4386 8.0 (L)   Albumin (g/dL)  Date Value  87/57/9728 2.3 (L)   Phosphorus (mg/dL)  Date Value  20/60/1561 3.2   Sodium (mmol/L)  Date Value  03/13/2020 141   Corrected Ca: 9.1 mg/dL  Assessment: 49 year old female presented with altered mental status. Patient with h/o TBI with tracheostomy. Patient with strep pyogenes and ecoli bacteremia. She has sacral and left heel decubitus ulcers as well as a wound to the right posterior leg. Pharmacy to manage electrolytes. Electrolytes low, concerning for refeeding.  Goal of Therapy:  Electrolytes WNL: K ~4 Mg ~2  Plan:  No replacement.Will continue to follow along with MD.  Pricilla Riffle ,PharmD Clinical Pharmacist 03/13/2020 1:17 PM

## 2020-03-13 NOTE — Progress Notes (Signed)
Attempted trach care pt sleeping

## 2020-03-13 NOTE — Progress Notes (Signed)
Attempted Trach care pt sleeping
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
## 2020-03-13 NOTE — Progress Notes (Addendum)
Chart reviewed, visted with patient. Tolerating current Dysphagia 1 diet with nectar thick liquids. Placed PMV on for meal and eased communication. Pt always asks for prayer when ST visits and seems to enjoy the compnay. Requested pastoral care visit.

## 2020-03-13 NOTE — Progress Notes (Signed)
Attempted trach care pt eating
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
## 2020-03-13 NOTE — Progress Notes (Signed)
Attempted trach care pt with [facility]

## 2020-03-14 LAB — GLUCOSE, CAPILLARY
Glucose-Capillary: 79 mg/dL (ref 70–99)
Glucose-Capillary: 84 mg/dL (ref 70–99)
Glucose-Capillary: 99 mg/dL (ref 70–99)
Glucose-Capillary: 114 mg/dL — ABNORMAL HIGH (ref 70–99)
Glucose-Capillary: 83 mg/dL (ref 70–99)

## 2020-03-14 LAB — CBC WITH DIFFERENTIAL/PLATELET
Abs Immature Granulocytes: 0.07 10*3/uL (ref 0.00–0.07)
Basophils Absolute: 0 10*3/uL (ref 0.0–0.1)
Basophils Relative: 0 %
Eosinophils Absolute: 0.1 10*3/uL (ref 0.0–0.5)
Eosinophils Relative: 1 %
HCT: 28.5 % — ABNORMAL LOW (ref 36.0–46.0)
Hemoglobin: 8.9 g/dL — ABNORMAL LOW (ref 12.0–15.0)
Immature Granulocytes: 1 %
Lymphocytes Relative: 22 %
Lymphs Abs: 2.1 10*3/uL (ref 0.7–4.0)
MCH: 27.7 pg (ref 26.0–34.0)
MCHC: 31.2 g/dL (ref 30.0–36.0)
MCV: 88.8 fL (ref 80.0–100.0)
Monocytes Absolute: 0.7 10*3/uL (ref 0.1–1.0)
Monocytes Relative: 7 %
Neutro Abs: 6.7 10*3/uL (ref 1.7–7.7)
Neutrophils Relative %: 69 %
Platelets: 308 10*3/uL (ref 150–400)
RBC: 3.21 MIL/uL — ABNORMAL LOW (ref 3.87–5.11)
RDW: 17.3 % — ABNORMAL HIGH (ref 11.5–15.5)
WBC: 9.7 10*3/uL (ref 4.0–10.5)
nRBC: 0 % (ref 0.0–0.2)

## 2020-03-14 LAB — MAGNESIUM: Magnesium: 2 mg/dL (ref 1.7–2.4)

## 2020-03-14 LAB — PHOSPHORUS: Phosphorus: 3.4 mg/dL (ref 2.5–4.6)

## 2020-03-14 MED ORDER — MORPHINE SULFATE (PF) 2 MG/ML IV SOLN
2.0000 mg | INTRAVENOUS | Status: DC | PRN
  Administered 2020-03-14 (×2): 2 mg via INTRAVENOUS
  Filled 2020-03-14: qty 1

## 2020-03-14 MED ORDER — SODIUM CHLORIDE 0.9 % IV BOLUS
1000.0000 mL | Freq: Once | INTRAVENOUS | Status: AC
  Administered 2020-03-14: 1000 mL via INTRAVENOUS

## 2020-03-14 NOTE — Progress Notes (Signed)
PHARMACY CONSULT NOTE  Pharmacy Consult for Electrolyte Monitoring and Replacement   Recent Labs: Potassium (mmol/L)  Date Value  03/13/2020 4.4   Magnesium (mg/dL)  Date Value  23/41/4436 2.0   Calcium (mg/dL)  Date Value  01/65/8006 8.0 (L)   Albumin (g/dL)  Date Value  34/94/9447 2.3 (L)   Phosphorus (mg/dL)  Date Value  39/58/4417 3.4   Sodium (mmol/L)  Date Value  03/13/2020 141   Corrected Ca: 9.1 mg/dL  Assessment: 49 year old female presented with altered mental status. Patient with h/o TBI with tracheostomy. Patient with strep pyogenes and ecoli bacteremia. She has sacral and left heel decubitus ulcers as well as a wound to the right posterior leg. Pharmacy to manage electrolytes. Electrolytes low, concerning for refeeding.  Goal of Therapy:  Electrolytes WNL: K ~4 Mg ~2  Plan:  Electrolytes stable. Sign off  Pricilla Riffle ,PharmD Clinical Pharmacist 03/14/2020 8:24 AM

## 2020-03-14 NOTE — Progress Notes (Signed)
Patient is having Sinus Tach rate 138, patient having pain. VS 89/58 (MAP 69); RR 24-28; afebrile.  Dr. Georgeann Oppenheim notified; new orders received.    03/14/20 1518  Assess: MEWS Score  Temp 98.3 F (36.8 C)  BP (!) 89/58  Pulse Rate (!) 132  Resp (!) 24  SpO2 99 %  O2 Device Room Air  Assess: MEWS Score  MEWS Temp 0  MEWS Systolic 1  MEWS Pulse 3  MEWS RR 1  MEWS LOC 0  MEWS Score 5  MEWS Score Color Red  Assess: if the MEWS score is Yellow or Red  Were vital signs taken at a resting state? Yes  Focused Assessment Documented focused assessment  Early Detection of Sepsis Score *See Row Information* Low  MEWS guidelines implemented *See Row Information* Yes  Treat  MEWS Interventions Administered prn meds/treatments;Escalated (See documentation below)  Take Vital Signs  Increase Vital Sign Frequency  Red: Q 1hr X 4 then Q 4hr X 4, if remains red, continue Q 4hrs  Escalate  MEWS: Escalate Red: discuss with charge nurse/RN and provider, consider discussing with RRT  Notify: Charge Nurse/RN  Name of Charge Nurse/RN Notified Darlene Listopad RN  Date Charge Nurse/RN Notified 03/14/20  Time Charge Nurse/RN Notified 1526  Notify: Provider  Provider Name/Title Sreenath MD  Date Provider Notified 03/14/20  Time Provider Notified 1525  Notification Type Page  Notification Reason Change in status  Response See new orders  Date of Provider Response 03/14/20  Time of Provider Response 1526  Document  Patient Outcome Stabilized after interventions  Progress note created (see row info) Yes
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
## 2020-03-15 LAB — MAGNESIUM: Magnesium: 1.9 mg/dL (ref 1.7–2.4)

## 2020-03-15 LAB — PHOSPHORUS: Phosphorus: 3.2 mg/dL (ref 2.5–4.6)

## 2020-03-15 LAB — CBC WITH DIFFERENTIAL/PLATELET
Abs Immature Granulocytes: 0.08 10*3/uL — ABNORMAL HIGH (ref 0.00–0.07)
Basophils Absolute: 0 10*3/uL (ref 0.0–0.1)
Basophils Relative: 0 %
Eosinophils Absolute: 0.1 10*3/uL (ref 0.0–0.5)
Eosinophils Relative: 1 %
HCT: 28.2 % — ABNORMAL LOW (ref 36.0–46.0)
Hemoglobin: 8.8 g/dL — ABNORMAL LOW (ref 12.0–15.0)
Immature Granulocytes: 1 %
Lymphocytes Relative: 24 %
Lymphs Abs: 2.9 10*3/uL (ref 0.7–4.0)
MCH: 28.1 pg (ref 26.0–34.0)
MCHC: 31.2 g/dL (ref 30.0–36.0)
MCV: 90.1 fL (ref 80.0–100.0)
Monocytes Absolute: 1.1 10*3/uL — ABNORMAL HIGH (ref 0.1–1.0)
Monocytes Relative: 9 %
Neutro Abs: 8.1 10*3/uL — ABNORMAL HIGH (ref 1.7–7.7)
Neutrophils Relative %: 65 %
Platelets: 331 10*3/uL (ref 150–400)
RBC: 3.13 MIL/uL — ABNORMAL LOW (ref 3.87–5.11)
RDW: 17.5 % — ABNORMAL HIGH (ref 11.5–15.5)
WBC: 12.2 10*3/uL — ABNORMAL HIGH (ref 4.0–10.5)
nRBC: 0 % (ref 0.0–0.2)

## 2020-03-15 LAB — BASIC METABOLIC PANEL
Anion gap: 6 (ref 5–15)
BUN: 9 mg/dL (ref 6–20)
CO2: 28 mmol/L (ref 22–32)
Calcium: 7.6 mg/dL — ABNORMAL LOW (ref 8.9–10.3)
Chloride: 112 mmol/L — ABNORMAL HIGH (ref 98–111)
Creatinine, Ser: 0.39 mg/dL — ABNORMAL LOW (ref 0.44–1.00)
GFR calc Af Amer: >60 mL/min (ref >60)
GFR calc non Af Amer: >60 mL/min (ref >60)
Glucose, Bld: 76 mg/dL (ref 70–99)
Potassium: 3.5 mmol/L (ref 3.5–5.1)
Sodium: 146 mmol/L — ABNORMAL HIGH (ref 135–145)

## 2020-03-15 LAB — GLUCOSE, CAPILLARY
Glucose-Capillary: 124 mg/dL — ABNORMAL HIGH (ref 70–99)
Glucose-Capillary: 133 mg/dL — ABNORMAL HIGH (ref 70–99)
Glucose-Capillary: 71 mg/dL (ref 70–99)
Glucose-Capillary: 71 mg/dL (ref 70–99)
Glucose-Capillary: 86 mg/dL (ref 70–99)
Glucose-Capillary: 87 mg/dL (ref 70–99)

## 2020-03-15 LAB — TSH: TSH: 2.705 u[IU]/mL (ref 0.350–4.500)

## 2020-03-15 MED ORDER — OXYCODONE HCL 5 MG PO TABS
5.0000 mg | ORAL_TABLET | ORAL | Status: DC | PRN
  Administered 2020-03-15: 5 mg via ORAL
  Filled 2020-03-15: qty 1

## 2020-03-15 MED ORDER — OXYCODONE HCL 5 MG PO TABS
10.0000 mg | ORAL_TABLET | ORAL | Status: DC | PRN
  Administered 2020-03-15 – 2020-03-16 (×5): 10 mg via ORAL
  Filled 2020-03-15 (×5): qty 2

## 2020-03-15 MED ORDER — SODIUM CHLORIDE 0.9 % IV BOLUS
500.0000 mL | Freq: Once | INTRAVENOUS | Status: AC
  Administered 2020-03-15: 500 mL via INTRAVENOUS

## 2020-03-15 NOTE — Care Management Important Message (Signed)
Important Message  Patient Details  Name: Joanne [name] MRN: [id number] Date of Birth: 03-16-1971   Medicare Important Message Given:  Yes  Reviewed with legal guardian.    [name] 03/15/2020, 12:24 PM

## 2020-03-15 NOTE — Progress Notes (Signed)
   03/15/20 1120  Assess: MEWS Score  Temp 98.2 F (36.8 C)  BP 101/76  Pulse Rate (!) 116  Resp 19  Level of Consciousness Alert  SpO2 100 %  O2 Device Room Air  Patient Activity (if Appropriate) In bed  Assess: MEWS Score  MEWS Temp 0  MEWS Systolic 0  MEWS Pulse 2  MEWS RR 0  MEWS LOC 0  MEWS Score 2  MEWS Score Color Yellow  Assess: if the MEWS score is Yellow or Red  Were vital signs taken at a resting state? Yes  Focused Assessment Documented focused assessment  Early Detection of Sepsis Score *See Row Information* Medium  MEWS guidelines implemented *See Row Information* Yes  Treat  MEWS Interventions Administered prn meds/treatments  Take Vital Signs  Increase Vital Sign Frequency  Yellow: Q 2hr X 2 then Q 4hr X 2, if remains yellow, continue Q 4hrs  Notify: Charge Nurse/RN  Name of Charge Nurse/RN Notified Alcario Drought, RN  Date Charge Nurse/RN Notified 03/15/20  Time Charge Nurse/RN Notified 1130  Notify: Provider  Provider Name/Title Dr. Georgeann Oppenheim  Date Provider Notified 03/15/20  Time Provider Notified 1130  Notification Type Page  Notification Reason Change in status  Response No new orders;Other (Comment) (will give PRN pain meds)  Date of Provider Response 03/15/20  Time of Provider Response 1131  Document  Patient Outcome Other (Comment) (Asymptomatic - HR still high)  Progress note created (see row info) Yes
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
## 2020-03-15 NOTE — Progress Notes (Signed)
ID Pt doing better Does not c/o of pain Awake and alert  BP 115/84 (BP Location: Right Arm)   Pulse (!) 104   Temp 98 F (36.7 C) (Oral)   Resp 20   Ht 5\' 7"  (1.702 m)   Wt 63.3 kg   LMP  (LMP Unknown)   SpO2 92%   BMI 21.85 kg/m    Chest b/l air entry Tracheostomy Hss1s abd soft Extremities ( lower) flexion Sacral wound stable- has packing   Labs CBC Latest Ref Rng & Units 03/15/2020 03/14/2020 03/13/2020  WBC 4.0 - 10.5 K/uL 12.2(H) 9.7 11.0(H)  Hemoglobin 12.0 - 15.0 g/dL 03/15/2020) 8.9(L) 9.0(L)  Hematocrit 36.0 - 46.0 % 28.2(L) 28.5(L) 28.9(L)  Platelets 150 - 400 K/uL 331 308 333   CMP Latest Ref Rng & Units 03/15/2020 03/13/2020 03/12/2020  Glucose 70 - 99 mg/dL 76 80 91  BUN 6 - 20 mg/dL 9 8 7   Creatinine 0.44 - 1.00 mg/dL 03/14/2020) 7.42(V  Sodium 135 - 145 mmol/L 146(H) 141 147(H)  Potassium 3.5 - 5.1 mmol/L 3.5 4.4 3.4(L)  Chloride 98 - 111 mmol/L 112(H) 108 115(H)  CO2 22 - 32 mmol/L 28 27 25   Calcium 8.9 - 10.3 mg/dL 7.6(L) 8.0(L) 7.8(L)  Total Protein 6.5 - 8.1 g/dL - - -  Total Bilirubin 0.3 - 1.2 mg/dL - - -  Alkaline Phos 38 - 126 U/L - - -  AST 15 - 41 U/L - - -  ALT 0 - 44 U/L - - -    Impression/recommendation  Group A streptococcus bacteremia with septic shock- resolved. 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   Anemia  -stable  Discussed the management with the patient and hospitalist

## 2020-03-15 NOTE — Progress Notes (Signed)
Patient's vitals are now stable with a green mews. Sacral dressing changed and area assessed. Scant amount of blood on dressing. Removed due to loss bowel movement on dressing. Patient is difficult to communicate with but will nod head for answer. Requires full assistance to eat. Crush pill in applesauce. Very pleasant and cooperative.

## 2020-03-16 LAB — CBC WITH DIFFERENTIAL/PLATELET
Abs Immature Granulocytes: 0.06 10*3/uL (ref 0.00–0.07)
Basophils Absolute: 0 10*3/uL (ref 0.0–0.1)
Basophils Relative: 0 %
Eosinophils Absolute: 0.1 10*3/uL (ref 0.0–0.5)
Eosinophils Relative: 1 %
HCT: 29.9 % — ABNORMAL LOW (ref 36.0–46.0)
Hemoglobin: 9.2 g/dL — ABNORMAL LOW (ref 12.0–15.0)
Immature Granulocytes: 0 %
Lymphocytes Relative: 18 %
Lymphs Abs: 2.6 10*3/uL (ref 0.7–4.0)
MCH: 27.9 pg (ref 26.0–34.0)
MCHC: 30.8 g/dL (ref 30.0–36.0)
MCV: 90.6 fL (ref 80.0–100.0)
Monocytes Absolute: 1.3 10*3/uL — ABNORMAL HIGH (ref 0.1–1.0)
Monocytes Relative: 9 %
Neutro Abs: 10.4 10*3/uL — ABNORMAL HIGH (ref 1.7–7.7)
Neutrophils Relative %: 72 %
Platelets: 290 10*3/uL (ref 150–400)
RBC: 3.3 MIL/uL — ABNORMAL LOW (ref 3.87–5.11)
RDW: 17.8 % — ABNORMAL HIGH (ref 11.5–15.5)
WBC: 14.5 10*3/uL — ABNORMAL HIGH (ref 4.0–10.5)
nRBC: 0 % (ref 0.0–0.2)

## 2020-03-16 LAB — GLUCOSE, CAPILLARY
Glucose-Capillary: 127 mg/dL — ABNORMAL HIGH (ref 70–99)
Glucose-Capillary: 64 mg/dL — ABNORMAL LOW (ref 70–99)
Glucose-Capillary: 70 mg/dL (ref 70–99)
Glucose-Capillary: 71 mg/dL (ref 70–99)
Glucose-Capillary: 77 mg/dL (ref 70–99)
Glucose-Capillary: 82 mg/dL (ref 70–99)

## 2020-03-16 LAB — CREATININE, SERUM
Creatinine, Ser: 0.44 mg/dL (ref 0.44–1.00)
GFR calc Af Amer: >60 mL/min (ref >60)
GFR calc non Af Amer: >60 mL/min (ref >60)

## 2020-03-16 LAB — MAGNESIUM: Magnesium: 1.9 mg/dL (ref 1.7–2.4)

## 2020-03-16 LAB — PHOSPHORUS: Phosphorus: 3.2 mg/dL (ref 2.5–4.6)

## 2020-03-16 LAB — SARS CORONAVIRUS 2 BY RT PCR (HOSPITAL ORDER, PERFORMED IN ~~LOC~~ HOSPITAL LAB): SARS Coronavirus 2: NEGATIVE

## 2020-03-16 LAB — SEDIMENTATION RATE: Sed Rate: 88 mm/hr — ABNORMAL HIGH (ref 0–30)

## 2020-03-16 MED ORDER — OXYCODONE HCL 5 MG PO TABS: 5.0000 mg | ORAL_TABLET | ORAL | 0 refills | Status: DC | PRN

## 2020-03-16 MED ORDER — RISAQUAD PO CAPS: 1.0000 | ORAL_CAPSULE | Freq: Two times a day (BID) | ORAL | 0 refills | Status: AC

## 2020-03-16 MED ORDER — MIDODRINE HCL 5 MG PO TABS: 5.0000 mg | ORAL_TABLET | Freq: Three times a day (TID) | ORAL | 0 refills | Status: AC

## 2020-03-16 MED ORDER — CEPHALEXIN 500 MG PO CAPS
500.0000 mg | ORAL_CAPSULE | Freq: Four times a day (QID) | ORAL | Status: DC
  Administered 2020-03-16 (×2): 500 mg via ORAL
  Filled 2020-03-16 (×2): qty 1

## 2020-03-16 MED ORDER — CEPHALEXIN 500 MG PO CAPS: 500.0000 mg | ORAL_CAPSULE | Freq: Four times a day (QID) | ORAL | 0 refills | Status: AC

## 2020-03-16 NOTE — TOC Transition Note (Signed)
Transition of Care [facility]) - CM/SW Discharge Note   Patient Details  Name: Joanne [name] MRN: [id number] Date of Birth: [birth year]  Transition of Care [facility]) CM/SW Contact:  Maree Krabbe, LCSW Phone Number: 03/16/2020, 4:03 PM   Clinical Narrative:   Clinical Social Worker facilitated patient discharge including contacting patient family (called legal guardian, no answer, had to leave voicemail) and facility to confirm patient discharge plans.  Clinical information faxed to facility and family agreeable with plan.  CSW arranged ambulance transport via [facility] to [facility].  RN to call for report prior to discharge.   Final next level of care: Skilled Nursing Facility Barriers to Discharge: No Barriers Identified   Patient Goals and CMS Choice Patient states their goals for this hospitalization and ongoing recovery are:: to go back to facility CMS Medicare.gov Compare Post Acute Care list provided to:: Legal Guardian Choice offered to / list presented to : [facility] POA / Guardian  Discharge Placement              Patient chooses bed at: [facility] Patient to be transferred to facility by: [facility] Name of family member notified: Called legal guardian had to leave vmail Patient and family notified of of transfer: 03/16/20  Discharge Plan and Services In-house Referral: NA   Post Acute Care Choice: Skilled Nursing Facility                               Social Determinants of Health (SDOH) Interventions     Readmission Risk Interventions Readmission Risk Prevention Plan 03/10/2020 03/03/2020  Transportation Screening Complete Complete  PCP or Specialist Appt within 3-5 Days Complete Complete  HRI or Home Care Consult Complete Complete  Social Work Consult for Recovery Care Planning/Counseling Complete -  Palliative Care Screening Not Applicable Complete  Medication Review Oceanographer) Complete Complete  Some recent data might be hidden
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
## 2020-03-16 NOTE — Discharge Instructions (Signed)
While on Oral Keflex, the patient requires weekly CBC and CMP.  Please fax results to Dr. Donato Schultz at [phone]

## 2020-03-16 NOTE — Progress Notes (Signed)
Talked with Pt about bladder scanning bc she has no output this shift so far. Stated she had used the bedside commode at the change of shift but output was not documented. Will cont to monitor for any output. Patient drank fluid at 0130.
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
## 2020-03-17 ENCOUNTER — Telehealth: Payer: Self-pay | Admitting: Internal Medicine

## 2020-03-17 LAB — GLUCOSE, CAPILLARY: Glucose-Capillary: <10 mg/dL — CL (ref 70–99)

## 2020-03-17 NOTE — Telephone Encounter (Signed)
Tried calling Joanne [name] and the line rang multiple times with NA and no VM

## 2020-03-18 NOTE — Telephone Encounter (Signed)
Tried calling Tera again at the number provided- NA and no VM x 2

## 2020-03-19 NOTE — Telephone Encounter (Signed)
Tried calling Tera again and still no answer and no option to leave msg. Will close per protocol.

## 2020-04-27 ENCOUNTER — Inpatient Hospital Stay: Payer: Medicare PPO | Admitting: Adult Health

## 2020-05-15 ENCOUNTER — Encounter: Payer: Self-pay | Admitting: Emergency Medicine

## 2020-05-15 ENCOUNTER — Emergency Department: Payer: Medicare PPO

## 2020-05-15 ENCOUNTER — Other Ambulatory Visit: Payer: Self-pay

## 2020-05-15 ENCOUNTER — Inpatient Hospital Stay
Admission: EM | Admit: 2020-05-15 | Discharge: 2020-06-10 | DRG: 853 | Disposition: A | Discharge status: Skilled Nursing Facility | Payer: Medicare PPO | Source: Skilled Nursing Facility | Attending: Internal Medicine | Admitting: Internal Medicine

## 2020-05-15 DIAGNOSIS — S06301A Unspecified focal traumatic brain injury with loss of consciousness of 30 minutes or less, initial encounter: Secondary | ICD-10-CM | POA: Diagnosis not present

## 2020-05-15 DIAGNOSIS — S069XAA Unspecified intracranial injury with loss of consciousness status unknown, initial encounter: Secondary | ICD-10-CM | POA: Diagnosis present

## 2020-05-15 DIAGNOSIS — Z87820 Personal history of traumatic brain injury: Secondary | ICD-10-CM

## 2020-05-15 DIAGNOSIS — R531 Weakness: Secondary | ICD-10-CM | POA: Diagnosis not present

## 2020-05-15 DIAGNOSIS — M4628 Osteomyelitis of vertebra, sacral and sacrococcygeal region: Secondary | ICD-10-CM | POA: Diagnosis present

## 2020-05-15 DIAGNOSIS — J9621 Acute and chronic respiratory failure with hypoxia: Secondary | ICD-10-CM | POA: Diagnosis not present

## 2020-05-15 DIAGNOSIS — E1143 Type 2 diabetes mellitus with diabetic autonomic (poly)neuropathy: Secondary | ICD-10-CM | POA: Diagnosis present

## 2020-05-15 DIAGNOSIS — G40909 Epilepsy, unspecified, not intractable, without status epilepticus: Secondary | ICD-10-CM | POA: Diagnosis present

## 2020-05-15 DIAGNOSIS — J90 Pleural effusion, not elsewhere classified: Secondary | ICD-10-CM | POA: Diagnosis not present

## 2020-05-15 DIAGNOSIS — S06890S Other specified intracranial injury without loss of consciousness, sequela: Secondary | ICD-10-CM

## 2020-05-15 DIAGNOSIS — Z903 Acquired absence of stomach [part of]: Secondary | ICD-10-CM

## 2020-05-15 DIAGNOSIS — N179 Acute kidney failure, unspecified: Secondary | ICD-10-CM | POA: Diagnosis not present

## 2020-05-15 DIAGNOSIS — J9600 Acute respiratory failure, unspecified whether with hypoxia or hypercapnia: Secondary | ICD-10-CM

## 2020-05-15 DIAGNOSIS — Z8619 Personal history of other infectious and parasitic diseases: Secondary | ICD-10-CM | POA: Diagnosis not present

## 2020-05-15 DIAGNOSIS — J189 Pneumonia, unspecified organism: Secondary | ICD-10-CM | POA: Diagnosis not present

## 2020-05-15 DIAGNOSIS — E860 Dehydration: Secondary | ICD-10-CM | POA: Diagnosis present

## 2020-05-15 DIAGNOSIS — E1122 Type 2 diabetes mellitus with diabetic chronic kidney disease: Secondary | ICD-10-CM | POA: Diagnosis present

## 2020-05-15 DIAGNOSIS — J690 Pneumonitis due to inhalation of food and vomit: Secondary | ICD-10-CM | POA: Diagnosis not present

## 2020-05-15 DIAGNOSIS — N182 Chronic kidney disease, stage 2 (mild): Secondary | ICD-10-CM | POA: Diagnosis present

## 2020-05-15 DIAGNOSIS — K439 Ventral hernia without obstruction or gangrene: Secondary | ICD-10-CM | POA: Diagnosis present

## 2020-05-15 DIAGNOSIS — L89154 Pressure ulcer of sacral region, stage 4: Secondary | ICD-10-CM | POA: Diagnosis present

## 2020-05-15 DIAGNOSIS — J9602 Acute respiratory failure with hypercapnia: Secondary | ICD-10-CM | POA: Diagnosis present

## 2020-05-15 DIAGNOSIS — I130 Hypertensive heart and chronic kidney disease with heart failure and stage 1 through stage 4 chronic kidney disease, or unspecified chronic kidney disease: Secondary | ICD-10-CM | POA: Diagnosis present

## 2020-05-15 DIAGNOSIS — R6510 Systemic inflammatory response syndrome (SIRS) of non-infectious origin without acute organ dysfunction: Secondary | ICD-10-CM

## 2020-05-15 DIAGNOSIS — G8250 Quadriplegia, unspecified: Secondary | ICD-10-CM | POA: Diagnosis present

## 2020-05-15 DIAGNOSIS — E876 Hypokalemia: Secondary | ICD-10-CM | POA: Diagnosis present

## 2020-05-15 DIAGNOSIS — Z79890 Hormone replacement therapy (postmenopausal): Secondary | ICD-10-CM | POA: Diagnosis not present

## 2020-05-15 DIAGNOSIS — R1310 Dysphagia, unspecified: Secondary | ICD-10-CM | POA: Diagnosis present

## 2020-05-15 DIAGNOSIS — A419 Sepsis, unspecified organism: Secondary | ICD-10-CM | POA: Diagnosis not present

## 2020-05-15 DIAGNOSIS — A4159 Other Gram-negative sepsis: Secondary | ICD-10-CM | POA: Diagnosis present

## 2020-05-15 DIAGNOSIS — Z79899 Other long term (current) drug therapy: Secondary | ICD-10-CM

## 2020-05-15 DIAGNOSIS — Z515 Encounter for palliative care: Secondary | ICD-10-CM | POA: Diagnosis not present

## 2020-05-15 DIAGNOSIS — Z4659 Encounter for fitting and adjustment of other gastrointestinal appliance and device: Secondary | ICD-10-CM

## 2020-05-15 DIAGNOSIS — Z930 Tracheostomy status: Secondary | ICD-10-CM | POA: Diagnosis not present

## 2020-05-15 DIAGNOSIS — E872 Acidosis: Secondary | ICD-10-CM | POA: Diagnosis present

## 2020-05-15 DIAGNOSIS — D649 Anemia, unspecified: Secondary | ICD-10-CM | POA: Diagnosis present

## 2020-05-15 DIAGNOSIS — E039 Hypothyroidism, unspecified: Secondary | ICD-10-CM | POA: Diagnosis present

## 2020-05-15 DIAGNOSIS — S069X0S Unspecified intracranial injury without loss of consciousness, sequela: Secondary | ICD-10-CM | POA: Diagnosis not present

## 2020-05-15 DIAGNOSIS — J9601 Acute respiratory failure with hypoxia: Secondary | ICD-10-CM

## 2020-05-15 DIAGNOSIS — J9690 Respiratory failure, unspecified, unspecified whether with hypoxia or hypercapnia: Secondary | ICD-10-CM

## 2020-05-15 DIAGNOSIS — L89212 Pressure ulcer of right hip, stage 2: Secondary | ICD-10-CM | POA: Diagnosis present

## 2020-05-15 DIAGNOSIS — Z7189 Other specified counseling: Secondary | ICD-10-CM | POA: Diagnosis not present

## 2020-05-15 DIAGNOSIS — E870 Hyperosmolality and hypernatremia: Secondary | ICD-10-CM | POA: Diagnosis present

## 2020-05-15 DIAGNOSIS — Z20822 Contact with and (suspected) exposure to covid-19: Secondary | ICD-10-CM | POA: Diagnosis present

## 2020-05-15 DIAGNOSIS — Z789 Other specified health status: Secondary | ICD-10-CM

## 2020-05-15 DIAGNOSIS — Z430 Encounter for attention to tracheostomy: Secondary | ICD-10-CM

## 2020-05-15 DIAGNOSIS — R0603 Acute respiratory distress: Secondary | ICD-10-CM

## 2020-05-15 DIAGNOSIS — I5043 Acute on chronic combined systolic (congestive) and diastolic (congestive) heart failure: Secondary | ICD-10-CM | POA: Diagnosis present

## 2020-05-15 DIAGNOSIS — E441 Mild protein-calorie malnutrition: Secondary | ICD-10-CM | POA: Diagnosis not present

## 2020-05-15 DIAGNOSIS — E43 Unspecified severe protein-calorie malnutrition: Secondary | ICD-10-CM | POA: Insufficient documentation

## 2020-05-15 DIAGNOSIS — A400 Sepsis due to streptococcus, group A: Secondary | ICD-10-CM | POA: Diagnosis present

## 2020-05-15 DIAGNOSIS — G894 Chronic pain syndrome: Secondary | ICD-10-CM | POA: Diagnosis present

## 2020-05-15 DIAGNOSIS — G9341 Metabolic encephalopathy: Secondary | ICD-10-CM | POA: Diagnosis present

## 2020-05-15 DIAGNOSIS — Z7982 Long term (current) use of aspirin: Secondary | ICD-10-CM

## 2020-05-15 DIAGNOSIS — Z6823 Body mass index (BMI) 23.0-23.9, adult: Secondary | ICD-10-CM

## 2020-05-15 DIAGNOSIS — I5042 Chronic combined systolic (congestive) and diastolic (congestive) heart failure: Secondary | ICD-10-CM | POA: Diagnosis not present

## 2020-05-15 DIAGNOSIS — L89620 Pressure ulcer of left heel, unstageable: Secondary | ICD-10-CM | POA: Diagnosis present

## 2020-05-15 DIAGNOSIS — R6521 Severe sepsis with septic shock: Secondary | ICD-10-CM | POA: Diagnosis present

## 2020-05-15 DIAGNOSIS — Z87440 Personal history of urinary (tract) infections: Secondary | ICD-10-CM

## 2020-05-15 DIAGNOSIS — Z791 Long term (current) use of non-steroidal anti-inflammatories (NSAID): Secondary | ICD-10-CM

## 2020-05-15 DIAGNOSIS — S069X9A Unspecified intracranial injury with loss of consciousness of unspecified duration, initial encounter: Secondary | ICD-10-CM | POA: Diagnosis present

## 2020-05-15 DIAGNOSIS — L89626 Pressure-induced deep tissue damage of left heel: Secondary | ICD-10-CM | POA: Diagnosis present

## 2020-05-15 DIAGNOSIS — R7881 Bacteremia: Secondary | ICD-10-CM | POA: Diagnosis not present

## 2020-05-15 LAB — COMPREHENSIVE METABOLIC PANEL
ALT: 25 U/L (ref 0–44)
AST: 38 U/L (ref 15–41)
Albumin: 1.5 g/dL — ABNORMAL LOW (ref 3.5–5.0)
Alkaline Phosphatase: 172 U/L — ABNORMAL HIGH (ref 38–126)
Anion gap: 16 — ABNORMAL HIGH (ref 5–15)
BUN: 52 mg/dL — ABNORMAL HIGH (ref 6–20)
CO2: 26 mmol/L (ref 22–32)
Calcium: 8.5 mg/dL — ABNORMAL LOW (ref 8.9–10.3)
Chloride: 124 mmol/L — ABNORMAL HIGH (ref 98–111)
Creatinine, Ser: 1.56 mg/dL — ABNORMAL HIGH (ref 0.44–1.00)
GFR calc Af Amer: 44 mL/min — ABNORMAL LOW (ref >60)
GFR calc non Af Amer: 38 mL/min — ABNORMAL LOW (ref >60)
Glucose, Bld: 133 mg/dL — ABNORMAL HIGH (ref 70–99)
Potassium: 2.7 mmol/L — CL (ref 3.5–5.1)
Sodium: 166 mmol/L (ref 135–145)
Total Bilirubin: 0.8 mg/dL (ref 0.3–1.2)
Total Protein: 8.6 g/dL — ABNORMAL HIGH (ref 6.5–8.1)

## 2020-05-15 LAB — CBC WITH DIFFERENTIAL/PLATELET
Abs Immature Granulocytes: 0.33 10*3/uL — ABNORMAL HIGH (ref 0.00–0.07)
Basophils Absolute: 0.1 10*3/uL (ref 0.0–0.1)
Basophils Relative: 1 %
Eosinophils Absolute: 0 10*3/uL (ref 0.0–0.5)
Eosinophils Relative: 0 %
HCT: 42 % (ref 36.0–46.0)
Hemoglobin: 12.8 g/dL (ref 12.0–15.0)
Immature Granulocytes: 2 %
Lymphocytes Relative: 8 %
Lymphs Abs: 1.7 10*3/uL (ref 0.7–4.0)
MCH: 27.9 pg (ref 26.0–34.0)
MCHC: 30.5 g/dL (ref 30.0–36.0)
MCV: 91.7 fL (ref 80.0–100.0)
Monocytes Absolute: 1 10*3/uL (ref 0.1–1.0)
Monocytes Relative: 5 %
Neutro Abs: 19.5 10*3/uL — ABNORMAL HIGH (ref 1.7–7.7)
Neutrophils Relative %: 84 %
Platelets: 273 10*3/uL (ref 150–400)
RBC: 4.58 MIL/uL (ref 3.87–5.11)
RDW: 18 % — ABNORMAL HIGH (ref 11.5–15.5)
Smear Review: NORMAL
WBC: 22.1 10*3/uL — ABNORMAL HIGH (ref 4.0–10.5)
nRBC: 0.6 % — ABNORMAL HIGH (ref 0.0–0.2)

## 2020-05-15 LAB — URINALYSIS, ROUTINE W REFLEX MICROSCOPIC
Bilirubin Urine: NEGATIVE
Glucose, UA: NEGATIVE mg/dL
Hgb urine dipstick: NEGATIVE
Ketones, ur: NEGATIVE mg/dL
Nitrite: NEGATIVE
Protein, ur: 30 mg/dL — AB
Specific Gravity, Urine: 1.028 (ref 1.005–1.030)
pH: 5 (ref 5.0–8.0)

## 2020-05-15 LAB — BLOOD GAS, VENOUS
Acid-Base Excess: 4 mmol/L — ABNORMAL HIGH (ref 0.0–2.0)
Bicarbonate: 29.7 mmol/L — ABNORMAL HIGH (ref 20.0–28.0)
O2 Saturation: 40 %
Patient temperature: 37
pCO2, Ven: 48 mmHg (ref 44.0–60.0)
pH, Ven: 7.4 (ref 7.250–7.430)
pO2, Ven: <31 mmHg — CL (ref 32.0–45.0)

## 2020-05-15 LAB — LACTIC ACID, PLASMA
Lactic Acid, Venous: 5.1 mmol/L (ref 0.5–1.9)
Lactic Acid, Venous: 3.9 mmol/L (ref 0.5–1.9)

## 2020-05-15 MED ORDER — VANCOMYCIN HCL 500 MG/100ML IV SOLN
500.0000 mg | Freq: Two times a day (BID) | INTRAVENOUS | Status: DC
  Administered 2020-05-15 – 2020-05-17 (×4): 500 mg via INTRAVENOUS
  Filled 2020-05-15 (×5): qty 100

## 2020-05-15 MED ORDER — BISACODYL 10 MG RE SUPP
10.0000 mg | Freq: Every day | RECTAL | Status: DC | PRN
  Filled 2020-05-15 (×2): qty 1

## 2020-05-15 MED ORDER — MIDODRINE HCL 5 MG PO TABS
5.0000 mg | ORAL_TABLET | Freq: Three times a day (TID) | ORAL | Status: DC
  Filled 2020-05-15 (×3): qty 1

## 2020-05-15 MED ORDER — IOHEXOL 300 MG/ML  SOLN
75.0000 mL | Freq: Once | INTRAMUSCULAR | Status: AC | PRN
  Administered 2020-05-15: 75 mL via INTRAVENOUS

## 2020-05-15 MED ORDER — VANCOMYCIN HCL IN DEXTROSE 1-5 GM/200ML-% IV SOLN
1000.0000 mg | Freq: Once | INTRAVENOUS | Status: AC
  Administered 2020-05-15: 1000 mg via INTRAVENOUS
  Filled 2020-05-15: qty 200

## 2020-05-15 MED ORDER — TAMSULOSIN HCL 0.4 MG PO CAPS
0.4000 mg | ORAL_CAPSULE | Freq: Every day | ORAL | Status: DC
  Administered 2020-05-15: 0.4 mg via ORAL
  Filled 2020-05-15: qty 1

## 2020-05-15 MED ORDER — ACETAMINOPHEN 325 MG PO TABS: 650.0000 mg | ORAL_TABLET | Freq: Four times a day (QID) | ORAL | Status: DC | PRN

## 2020-05-15 MED ORDER — DEXTROSE 5 % IV SOLN: INTRAVENOUS | Status: DC

## 2020-05-15 MED ORDER — OXYCODONE HCL 5 MG PO TABS
5.0000 mg | ORAL_TABLET | ORAL | Status: DC | PRN
  Administered 2020-05-16: 5 mg via ORAL
  Filled 2020-05-15: qty 1

## 2020-05-15 MED ORDER — POLYETHYLENE GLYCOL 3350 17 G PO PACK
17.0000 g | PACK | Freq: Every day | ORAL | Status: DC
  Administered 2020-05-18 – 2020-05-31 (×5): 17 g via ORAL
  Filled 2020-05-15 (×5): qty 1

## 2020-05-15 MED ORDER — SODIUM CHLORIDE 0.9% FLUSH
10.0000 mL | Freq: Two times a day (BID) | INTRAVENOUS | Status: DC
  Administered 2020-05-16 – 2020-05-21 (×6): 10 mL
  Administered 2020-05-21: 20 mL
  Administered 2020-05-22 – 2020-06-10 (×35): 10 mL

## 2020-05-15 MED ORDER — POTASSIUM CHLORIDE 10 MEQ/100ML IV SOLN
10.0000 meq | INTRAVENOUS | Status: AC
  Administered 2020-05-15 – 2020-05-16 (×4): 10 meq via INTRAVENOUS
  Filled 2020-05-15 (×3): qty 100

## 2020-05-15 MED ORDER — SODIUM CHLORIDE 0.9% FLUSH
10.0000 mL | INTRAVENOUS | Status: DC | PRN
  Administered 2020-05-25: 10 mL

## 2020-05-15 MED ORDER — PIPERACILLIN-TAZOBACTAM 3.375 G IVPB 30 MIN
3.3750 g | Freq: Once | INTRAVENOUS | Status: AC
  Administered 2020-05-15: 3.375 g via INTRAVENOUS
  Filled 2020-05-15: qty 50

## 2020-05-15 MED ORDER — POTASSIUM CHLORIDE IN NACL 20-0.9 MEQ/L-% IV SOLN
INTRAVENOUS | Status: DC
  Filled 2020-05-15 (×2): qty 1000

## 2020-05-15 MED ORDER — COLLAGENASE 250 UNIT/GM EX OINT
TOPICAL_OINTMENT | Freq: Every day | CUTANEOUS | Status: DC
  Administered 2020-05-22 – 2020-05-26 (×3): 1 via TOPICAL
  Filled 2020-05-15 (×10): qty 30

## 2020-05-15 MED ORDER — SODIUM CHLORIDE 0.9 % IV SOLN
2.0000 g | Freq: Two times a day (BID) | INTRAVENOUS | Status: DC
  Administered 2020-05-15 – 2020-05-17 (×4): 2 g via INTRAVENOUS
  Filled 2020-05-15 (×5): qty 2

## 2020-05-15 MED ORDER — ENOXAPARIN SODIUM 40 MG/0.4ML ~~LOC~~ SOLN
40.0000 mg | SUBCUTANEOUS | Status: DC
  Administered 2020-05-15 – 2020-05-19 (×5): 40 mg via SUBCUTANEOUS
  Filled 2020-05-15 (×5): qty 0.4

## 2020-05-15 MED ORDER — ASPIRIN EC 81 MG PO TBEC
81.0000 mg | DELAYED_RELEASE_TABLET | Freq: Every day | ORAL | Status: DC
  Administered 2020-05-15: 81 mg via ORAL
  Filled 2020-05-15: qty 1

## 2020-05-15 MED ORDER — POLYETHYLENE GLYCOL 3350 17 GM/SCOOP PO POWD
17.0000 g | Freq: Every day | ORAL | Status: DC
  Filled 2020-05-15: qty 255

## 2020-05-15 MED ORDER — SODIUM CHLORIDE 0.9 % IV SOLN: Freq: Once | INTRAVENOUS | Status: AC

## 2020-05-15 MED ORDER — FAMOTIDINE 20 MG PO TABS
20.0000 mg | ORAL_TABLET | Freq: Two times a day (BID) | ORAL | Status: DC
  Administered 2020-05-15: 20 mg via ORAL
  Filled 2020-05-15: qty 1

## 2020-05-15 MED ORDER — SODIUM CHLORIDE 0.9 % IV BOLUS
2000.0000 mL | Freq: Once | INTRAVENOUS | Status: AC
  Administered 2020-05-15: 2000 mL via INTRAVENOUS

## 2020-05-15 MED ORDER — CARBAMAZEPINE ER 100 MG PO TB12
100.0000 mg | ORAL_TABLET | Freq: Two times a day (BID) | ORAL | Status: DC
  Administered 2020-05-15 – 2020-05-19 (×3): 100 mg via ORAL
  Filled 2020-05-15 (×11): qty 1

## 2020-05-15 NOTE — Consult Note (Signed)
Pharmacy Antibiotic Note  Joanne [name] is a 49 y.o. female admitted on 05/15/2020 with sepsis and cellulitis.  Pharmacy has been consulted for Cefepime/Vancomycin dosing.  Plan: Pt received Vancomycin 1000mg  loading dose in ED, will follow with 500mg  q12h per dosing nomogram for sepsis.  Will start Cefepime 2g q12h (CrCL 30-8ml/min)  Height: 5\' 7"  (170.2 cm) Weight: 63.5 kg (139 lb 15.9 oz) IBW/kg (Calculated) : 61.6  Temp (24hrs), Avg:100.2 F (37.9 C), Min:99.2 F (37.3 C), Max:101.1 F (38.4 C)  Recent Labs  Lab 05/15/20 1329 05/15/20 1555  WBC 22.1*  --   CREATININE 1.56*  --   LATICACIDVEN 5.1* 3.9*    Estimated Creatinine Clearance: 42 mL/min (A) (by C-G formula based on SCr of 1.56 mg/dL (H)).    Allergies  Allergen Reactions  . Kaopectate  [Attapulgite] Itching  . Thiopental Itching  . Tomato     Antimicrobials this admission: Zosyn x 1 7/24  Cefepime 7/24 > Vancomycin 7/24 >  Dose adjustments this admission: NA  Microbiology results: 7/24 BCx/UCx: pending   Thank you for allowing pharmacy to be a part of this patient's care.  8/24, PharmD, BCPS Clinical Pharmacist 05/15/2020 7:12 PM

## 2020-05-15 NOTE — H&P (Signed)
History and Physical:    Joanne [name]   SEG:[id number] DOB: 09-01-1971 DOA: 05/15/2020  Referring MD/provider: Dr. Mayford Knife PCP: Patient, No Pcp Per   Patient coming from: Home  Chief Complaint: Sepsis  History of Present Illness:   Joanne [name] is an 49 y.o. female with PMH significant for TBI requiring chronic trach who lives in a SNF who is now brought to the ED for fever and difficulty obtaining O2 saturations.  Of note patient was recently discharged after treatment for septic shock secondary to UTI and possible pneumonia is now readmitted for recurrent sepsis.  This course was complicated by acute kidney injury, hypernatremia and ARDS.  Patient is unable to provide me a history.  History is entirely per ED documentation and chart review  ED Course:  The patient was noted to be hypotensive, tachycardic and febrile.  She was given 1 L normal saline bolus and started on vancomycin and Zosyn.  She was also noted to be hyponatremic which seems to be somewhat of a chronic condition for her as she was also hyponatremic on her last admission in May.  Chest x-ray was done which was read as negative.  ROS:   ROS   Review of Systems: Unable to provide Past Medical History:   Past Medical History:  Diagnosis Date  . Acute kidney injury (AKI) with acute tubular necrosis (ATN) (HCC)   . Acute on chronic combined systolic and diastolic CHF (congestive heart failure) (HCC) 06/06/2018  . Acute on chronic respiratory failure with hypoxia (HCC) 06/06/2018  . ARDS (adult respiratory distress syndrome) (HCC) 06/06/2018  . Aspiration pneumonia due to gastric secretions (HCC)   . Diabetes mellitus (HCC)   . History of traumatic brain injury   . Hypothyroidism   . Pneumonia due to Klebsiella pneumoniae (HCC) 06/06/2018  . Seizure disorder (HCC)   . Severe sepsis (HCC) 06/06/2018  . 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 Traumatic brain injury [facility])     Past Surgical History:   Past  Surgical History:  Procedure Laterality Date  . TRACHEOSTOMY      Social History:   Social History   Socioeconomic History  . Marital status: Single    Spouse name: Not on file  . Number of children: Not on file  . Years of education: Not on file  . Highest education level: Not on file  Occupational History  . Not on file  Tobacco Use  . Smoking status: Never Smoker  . Smokeless tobacco: Never Used  Substance and Sexual Activity  . Alcohol use: Not Currently  . Drug use: Not Currently  . Sexual activity: Not Currently  Other Topics Concern  . Not on file  Social History Narrative  . Not on file   Social Determinants of Health   Financial Resource Strain:   . Difficulty of Paying Living Expenses:   Food Insecurity:   . Worried About Programme researcher, broadcasting/film/video in the Last Year:   . Barista in the Last Year:   Transportation Needs:   . Freight forwarder (Medical):   Marland Kitchen Lack of Transportation (Non-Medical):   Physical Activity:   . Days of Exercise per Week:   . Minutes of Exercise per Session:   Stress:   . Feeling of Stress :   Social Connections:   . Frequency of Communication with Friends and Family:   . Frequency of Social Gatherings with Friends and Family:   . Attends Religious Services:   .  Active Member of Clubs or Organizations:   . Attends Banker Meetings:   Marland Kitchen Marital Status:   Intimate Partner Violence:   . Fear of Current or Ex-Partner:   . Emotionally Abused:   Marland Kitchen Physically Abused:   . Sexually Abused:     Allergies   Kaopectate  [attapulgite], Thiopental, and Tomato  Family history:   Family History  Family history unknown: Yes    Current Medications:   Prior to Admission medications   Medication Sig Start Date End Date Taking? Authorizing Provider  acetaminophen (TYLENOL) 325 MG tablet Take 2 tablets (650 mg total) by mouth every 6 (six) hours as needed for mild pain or fever (or Fever >/= 101). 11/20/19  Yes  Charise Killian, MD  aspirin 81 MG EC tablet aspirin 81 mg tablet,delayed release  Take 1 tablet every day by oral route.   Yes [provider]  B Complex-C-E-Zn (B COMPLEX-C-E-ZINC) tablet Take by mouth.   Yes [provider]  carbamazepine (TEGRETOL XR) 100 MG 12 hr tablet Take 100 mg by mouth 2 (two) times daily.   Yes [provider]  cetirizine (ZYRTEC) 10 MG tablet Take by mouth.   Yes [provider]  cyanocobalamin 50 MCG tablet Take by mouth.   Yes [provider]  famotidine (PEPCID) 20 MG tablet Take 20 mg by mouth 2 (two) times daily.   Yes [provider]  ferrous sulfate 325 (65 FE) MG tablet Take 325 mg by mouth daily.   Yes [provider]  folic acid (FOLVITE) 1 MG tablet Take 1 mg by mouth daily. 07/28/19  Yes [provider]  levothyroxine (SYNTHROID) 100 MCG tablet Take 100 mcg by mouth daily. 07/18/19  Yes [provider]  loratadine (CLARITIN) 10 MG tablet Take 20 mg by mouth daily.   Yes [provider]  midodrine (PROAMATINE) 5 MG tablet Take 5 mg by mouth 3 (three) times daily with meals.  05/06/20  Yes [provider]  MULTIPLE VITAMIN PO Take 1 tablet by mouth daily.   Yes [provider]  Neomy-Bacit-Polymyx-Pramoxine 1 % OINT Apply topically. Apply to (L) Lower buttock/At fold typically every evening shift for wound. Cover with gauze/conversite; Change QD 'til healed.   Yes [provider]  oxyCODONE (OXY IR/ROXICODONE) 5 MG immediate release tablet Take 1 tablet (5 mg total) by mouth every 4 (four) hours as needed for moderate pain. 03/16/20  Yes Sreenath, Sudheer B, MD  polyethylene glycol powder (GLYCOLAX/MIRALAX) 17 GM/SCOOP powder Take 17 g by mouth daily.   Yes [provider]  Polyvinyl Alcohol-Povidone PF 1.4-0.6 % SOLN Place 1 drop into both eyes 3 (three) times daily.   Yes [provider]  SANTYL ointment Apply topically.  05/12/20  Yes [provider]  tamsulosin (FLOMAX) 0.4 MG CAPS capsule Take 0.4 mg by mouth at bedtime.  06/16/19  Yes [provider]  traMADol (ULTRAM) 50 MG tablet Take 25 mg by mouth 3 (three) times daily. 08/05/19  Yes [provider]    Physical Exam:   Vitals:   05/15/20 1645 05/15/20 1700 05/15/20 1715 05/15/20 1849  BP: (!) 88/63 (!) 101/62 94/65 (!) 82/58  Pulse:   99 93  Resp:   (!) 24 (!) 24  Temp:      TempSrc:      SpO2:   98% 100%  Weight:      Height:         Physical Exam:  Blood pressure (!) 82/58, pulse 93, temperature 99.2 F (37.3 C), resp. rate (!) 24, height 5\' 7"  (1.702 m), weight 63.5 kg, SpO2 100 %. Gen: Chronically ill-appearing female with odd facies staring into space, she does attempt to answer yes or no but is unclear to me if she understands me Eyes: sclera anicteric, conjuctiva mildly injected bilaterally CVS: S1-S2, regulary, no gallops Respiratory: Transmitted upper respiratory sounds  GI: NABS, firm but not hard, nontender LE: No edema. No cyanosis   Data Review:    Labs: Basic Metabolic Panel: Recent Labs  Lab 05/15/20 1329  NA 166*  K 2.7*  CL 124*  CO2 26  GLUCOSE 133*  BUN 52*  CREATININE 1.56*  CALCIUM 8.5*   Liver Function Tests: Recent Labs  Lab 05/15/20 1329  AST 38  ALT 25  ALKPHOS 172*  BILITOT 0.8  PROT 8.6*  ALBUMIN 1.5*   No results for input(s): LIPASE, AMYLASE in the last 168 hours. No results for input(s): AMMONIA in the last 168 hours. CBC: Recent Labs  Lab 05/15/20 1329  WBC 22.1*  NEUTROABS 19.5*  HGB 12.8  HCT 42.0  MCV 91.7  PLT 273   Cardiac Enzymes: No results for input(s): CKTOTAL, CKMB, CKMBINDEX, TROPONINI in the last 168 hours.  BNP (last 3 results) No results for input(s): PROBNP in the last 8760 hours. CBG: No results for input(s): GLUCAP in the last 168 hours.  Urinalysis    Component Value Date/Time   COLORURINE AMBER (A) 05/15/2020 1403    APPEARANCEUR CLOUDY (A) 05/15/2020 1403   LABSPEC 1.028 05/15/2020 1403   PHURINE 5.0 05/15/2020 1403   GLUCOSEU NEGATIVE 05/15/2020 1403   HGBUR NEGATIVE 05/15/2020 1403   BILIRUBINUR NEGATIVE 05/15/2020 1403   KETONESUR NEGATIVE 05/15/2020 1403   PROTEINUR 30 (A) 05/15/2020 1403   NITRITE NEGATIVE 05/15/2020 1403   LEUKOCYTESUR SMALL (A) 05/15/2020 1403      Radiographic Studies: DG Chest Port 1 View  Result Date: 05/15/2020 CLINICAL DATA:  Sepsis, hypotension, tachycardia EXAM: PORTABLE CHEST 1 VIEW COMPARISON:  03/03/2020 FINDINGS: Stable heart size and vascularity. Similar right calcified pleural plaques. No new airspace process, collapse or consolidation. Negative for edema, effusion or pneumothorax. Tracheostomy midline. Scoliosis of the spine noted. Remote cholecystectomy. IMPRESSION: Stable chest exam.  No interval change or acute process. Electronically Signed   By: Judie PetitM.  Shick M.D.   On: 05/15/2020 14:24    EKG: Independently reviewed.  Sinus tach at 120 with rate related changes in the lateral right leads.   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  Hypernatremia Patient still requiring normal  saline fluid resuscitation so cannot start patient on D5W Will need to check sodium tonight to ensure that we are not correcting too quickly  Hypokalemia KCl 40  mEq IV to be given over 4 hours ordered We will then start normal saline with 20 of K at 150 cc an hour Repeat potassium tonight at 1130  Acute renal failure Should improve with treatment of infection and with fluid resuscitation  Hypothyroidism Continue Synthroid  Chronic pain Continue oxycodone Hold tramadol     Other information:   DVT prophylaxis: Lovenox ordered. Code Status: Full code per previous documentation Family Communication: None Disposition Plan: TBD Consults called: None Admission status: Inpatient  Canden Cieslinski Tublu Dovid Bartko [facility]  If 7PM-7AM, please contact night-coverage [email or web address] Password [facility] 05/15/2020, 7:11 PM

## 2020-05-15 NOTE — ED Provider Notes (Signed)
ER Provider Note       Time seen: 1:24 PM   I have reviewed the vital signs and the nursing notes.  HISTORY Level V caveat: History/ROS limited by previous TBI Chief Complaint Code Sepsis   HPI Joanne [name] is a 49 y.o. female with a history of acute kidney injury, heart failure, ARDS, diabetes, hypothyroidism, pneumonia, traumatic brain injury, tracheostomy who presents today for hypotension and tachycardia and possible sepsis.  Patient arrives from [facility] with abnormal vital signs, EMS states she was also febrile and they had a difficult time obtaining oxygen saturation levels.  On arrival she arrives with a nonrebreather.  She cannot give further review of systems or report.  Past Medical History:  Diagnosis Date  . Acute kidney injury (AKI) with acute tubular necrosis (ATN) (HCC)   . Acute on chronic combined systolic and diastolic CHF (congestive heart failure) (HCC) 06/06/2018  . Acute on chronic respiratory failure with hypoxia (HCC) 06/06/2018  . ARDS (adult respiratory distress syndrome) (HCC) 06/06/2018  . Aspiration pneumonia due to gastric secretions (HCC)   . Diabetes mellitus (HCC)   . History of traumatic brain injury   . Hypothyroidism   . Pneumonia due to Klebsiella pneumoniae (HCC) 06/06/2018  . Seizure disorder (HCC)   . Severe sepsis (HCC) 06/06/2018  . 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 Traumatic brain injury [facility])     Past Surgical History:  Procedure Laterality Date  . TRACHEOSTOMY      Allergies Kaopectate  [attapulgite], Thiopental, and Tomato  Review of Systems Constitutional: Positive for fever Review of systems otherwise unknown  All systems negative/normal/unremarkable except as stated in the HPI  ____________________________________________   PHYSICAL EXAM:  VITAL SIGNS: Vitals:   05/15/20 1315 05/15/20 1319  BP:  (!) 82/38  Pulse:  (!) 120  Resp:  (!) 30  Temp:  (!) 101.1 F (38.4 C)  SpO2: 90% 100%     Constitutional: Alert, chronically ill-appearing, mild distress  Eyes: Conjunctivae are normal.  ENT      Head: Previous left-sided craniotomy or craniectomy is noted      Nose: No congestion/rhinnorhea.      Mouth/Throat: Mucous membranes are moist.      Neck: No stridor.  Tracheostomy in place Cardiovascular: Rapid rate, regular rhythm.  Systolic murmur Respiratory: Tachypnea with mostly clear breath sounds Gastrointestinal: Soft and nontender. Normal bowel sounds Musculoskeletal: Limited range of motion of the extremities Neurologic: Patient has difficulty communicating, previous left hemiplegia, no gross focal neurologic deficits are appreciated.  Skin:  Skin is warm, dry and intact. No rash noted. ____________________________________________  EKG: Interpreted by me.  Sinus tachycardia with rate of 122 bpm, possible old posterior infarct, repolarization abnormality, normal QT  ____________________________________________   LABS (pertinent positives/negatives)  Labs Reviewed  LACTIC ACID, PLASMA - Abnormal; Notable for the following components:      Result Value   Lactic Acid, Venous 5.1 (*)    All other components within normal limits  COMPREHENSIVE METABOLIC PANEL - Abnormal; Notable for the following components:   Sodium 166 (*)    Potassium 2.7 (*)    Chloride 124 (*)    Glucose, Bld 133 (*)    BUN 52 (*)    Creatinine, Ser 1.56 (*)    Calcium 8.5 (*)    Total Protein 8.6 (*)    Albumin 1.5 (*)    Alkaline Phosphatase 172 (*)    GFR calc non Af Amer 38 (*)    GFR  calc Af Amer 44 (*)    Anion gap 16 (*)    All other components within normal limits  CBC WITH DIFFERENTIAL/PLATELET - Abnormal; Notable for the following components:   WBC 22.1 (*)    RDW 18.0 (*)    nRBC 0.6 (*)    All other components within normal limits  BLOOD GAS, VENOUS - Abnormal; Notable for the following components:   pO2, Ven <31.0 (*)    Bicarbonate 29.7 (*)    Acid-Base Excess 4.0  (*)    All other components within normal limits  CULTURE, BLOOD (ROUTINE X 2)  CULTURE, BLOOD (ROUTINE X 2)  URINE CULTURE  LACTIC ACID, PLASMA  URINALYSIS, ROUTINE W REFLEX MICROSCOPIC   CRITICAL CARE Performed by: Ulice Dash   Total critical care time: 30 minutes  Critical care time was exclusive of separately billable procedures and treating other patients.  Critical care was necessary to treat or prevent imminent or life-threatening deterioration.  Critical care was time spent personally by me on the following activities: development of treatment plan with patient and/or surrogate as well as nursing, discussions with consultants, evaluation of patient's response to treatment, examination of patient, obtaining history from patient or surrogate, ordering and performing treatments and interventions, ordering and review of laboratory studies, ordering and review of radiographic studies, pulse oximetry and re-evaluation of patient's condition.  RADIOLOGY  Images were viewed by me Chest x-ray  DIFFERENTIAL DIAGNOSIS  Sepsis, pneumonia, UTI, dehydration, electrolyte abnormality  ASSESSMENT AND PLAN  Sepsis, severe free water deficit and acute kidney injury, hypokalemia   Plan: The patient had presented for likely sepsis.  Standard sepsis protocols were utilized with fluids and broad-spectrum antibiotics.  Patient's labs revealed numerous abnormalities including severe free water deficit with a sodium of 166, acute kidney injury, lactic acidosis indicating sepsis and hypokalemia with a potassium of 2.7.  She had received normal saline which will be followed by D5W at a specific rate.  Patient is a full code, I will discuss with the hospitalist for admission.  Daryel November MD    Note: This note was generated in part or whole with voice recognition software. Voice recognition is usually quite accurate but there are transcription errors that can and very often do  occur. I apologize for any typographical errors that were not detected and corrected.     Emily Filbert, MD 05/15/20 1426

## 2020-05-15 NOTE — ED Notes (Addendum)
IV team bedside attempting to obtain a second IV access. Pt IV medications delayed d/t lack of access.

## 2020-05-15 NOTE — ED Triage Notes (Signed)
Pt to ER via EMS from [facility] with reports of hypotension and tachycardia.  Per EMS pt is also febrile and they were unable to get an accurate oxygen saturation.

## 2020-05-16 DIAGNOSIS — N179 Acute kidney failure, unspecified: Secondary | ICD-10-CM

## 2020-05-16 DIAGNOSIS — L89154 Pressure ulcer of sacral region, stage 4: Secondary | ICD-10-CM

## 2020-05-16 DIAGNOSIS — E039 Hypothyroidism, unspecified: Secondary | ICD-10-CM

## 2020-05-16 DIAGNOSIS — S069X0S Unspecified intracranial injury without loss of consciousness, sequela: Secondary | ICD-10-CM

## 2020-05-16 DIAGNOSIS — M4628 Osteomyelitis of vertebra, sacral and sacrococcygeal region: Secondary | ICD-10-CM

## 2020-05-16 DIAGNOSIS — Z930 Tracheostomy status: Secondary | ICD-10-CM

## 2020-05-16 DIAGNOSIS — E876 Hypokalemia: Secondary | ICD-10-CM

## 2020-05-16 LAB — BLOOD CULTURE ID PANEL (REFLEXED)

## 2020-05-16 LAB — URINE CULTURE: Culture: <10000 — AB

## 2020-05-16 LAB — CBC
HCT: 30.3 % — ABNORMAL LOW (ref 36.0–46.0)
Hemoglobin: 9.3 g/dL — ABNORMAL LOW (ref 12.0–15.0)
MCH: 28.5 pg (ref 26.0–34.0)
MCHC: 30.7 g/dL (ref 30.0–36.0)
MCV: 92.9 fL (ref 80.0–100.0)
Platelets: 166 10*3/uL (ref 150–400)
RBC: 3.26 MIL/uL — ABNORMAL LOW (ref 3.87–5.11)
RDW: 17.7 % — ABNORMAL HIGH (ref 11.5–15.5)
WBC: 22.8 10*3/uL — ABNORMAL HIGH (ref 4.0–10.5)
nRBC: 0.3 % — ABNORMAL HIGH (ref 0.0–0.2)

## 2020-05-16 LAB — BASIC METABOLIC PANEL
Anion gap: 7 (ref 5–15)
BUN: 30 mg/dL — ABNORMAL HIGH (ref 6–20)
CO2: 16 mmol/L — ABNORMAL LOW (ref 22–32)
Calcium: 7.2 mg/dL — ABNORMAL LOW (ref 8.9–10.3)
Chloride: 130 mmol/L — ABNORMAL HIGH (ref 98–111)
Creatinine, Ser: 0.95 mg/dL (ref 0.44–1.00)
GFR calc Af Amer: >60 mL/min (ref >60)
GFR calc non Af Amer: >60 mL/min (ref >60)
Glucose, Bld: 162 mg/dL — ABNORMAL HIGH (ref 70–99)
Potassium: 5 mmol/L (ref 3.5–5.1)
Sodium: 153 mmol/L — ABNORMAL HIGH (ref 135–145)

## 2020-05-16 LAB — COMPREHENSIVE METABOLIC PANEL
ALT: 17 U/L (ref 0–44)
AST: 26 U/L (ref 15–41)
Albumin: <1 g/dL — ABNORMAL LOW (ref 3.5–5.0)
Alkaline Phosphatase: 115 U/L (ref 38–126)
Anion gap: 8 (ref 5–15)
BUN: 37 mg/dL — ABNORMAL HIGH (ref 6–20)
CO2: 21 mmol/L — ABNORMAL LOW (ref 22–32)
Calcium: 6.9 mg/dL — ABNORMAL LOW (ref 8.9–10.3)
Chloride: 129 mmol/L — ABNORMAL HIGH (ref 98–111)
Creatinine, Ser: 1.12 mg/dL — ABNORMAL HIGH (ref 0.44–1.00)
GFR calc Af Amer: >60 mL/min (ref >60)
GFR calc non Af Amer: 57 mL/min — ABNORMAL LOW (ref >60)
Glucose, Bld: 183 mg/dL — ABNORMAL HIGH (ref 70–99)
Potassium: 2.4 mmol/L — CL (ref 3.5–5.1)
Sodium: 158 mmol/L — ABNORMAL HIGH (ref 135–145)
Total Bilirubin: 0.7 mg/dL (ref 0.3–1.2)
Total Protein: 5.8 g/dL — ABNORMAL LOW (ref 6.5–8.1)

## 2020-05-16 LAB — SARS CORONAVIRUS 2 BY RT PCR (HOSPITAL ORDER, PERFORMED IN ~~LOC~~ HOSPITAL LAB): SARS Coronavirus 2: NEGATIVE

## 2020-05-16 LAB — PROCALCITONIN: Procalcitonin: 0.47 ng/mL

## 2020-05-16 LAB — LACTIC ACID, PLASMA
Lactic Acid, Venous: 3.1 mmol/L (ref 0.5–1.9)
Lactic Acid, Venous: 2.8 mmol/L (ref 0.5–1.9)

## 2020-05-16 LAB — GLUCOSE, CAPILLARY: Glucose-Capillary: 86 mg/dL (ref 70–99)

## 2020-05-16 LAB — MAGNESIUM: Magnesium: 1.8 mg/dL (ref 1.7–2.4)

## 2020-05-16 LAB — MRSA PCR SCREENING: MRSA by PCR: NEGATIVE

## 2020-05-16 LAB — TSH: TSH: 2.588 u[IU]/mL (ref 0.350–4.500)

## 2020-05-16 LAB — T4, FREE: Free T4: 0.53 ng/dL — ABNORMAL LOW (ref 0.61–1.12)

## 2020-05-16 LAB — PHOSPHORUS: Phosphorus: 1.9 mg/dL — ABNORMAL LOW (ref 2.5–4.6)

## 2020-05-16 MED ORDER — POTASSIUM CHLORIDE 10 MEQ/100ML IV SOLN
10.0000 meq | INTRAVENOUS | Status: AC
  Administered 2020-05-16 (×6): 10 meq via INTRAVENOUS
  Filled 2020-05-16 (×7): qty 100

## 2020-05-16 MED ORDER — POTASSIUM PHOSPHATES 15 MMOLE/5ML IV SOLN
15.0000 mmol | Freq: Once | INTRAVENOUS | Status: AC
  Administered 2020-05-16: 15 mmol via INTRAVENOUS
  Filled 2020-05-16: qty 5

## 2020-05-16 MED ORDER — CHLORHEXIDINE GLUCONATE 0.12 % MT SOLN
15.0000 mL | Freq: Two times a day (BID) | OROMUCOSAL | Status: DC
  Administered 2020-05-16 – 2020-05-20 (×9): 15 mL via OROMUCOSAL
  Filled 2020-05-16 (×7): qty 15

## 2020-05-16 MED ORDER — MIDODRINE HCL 5 MG PO TABS
10.0000 mg | ORAL_TABLET | Freq: Three times a day (TID) | ORAL | Status: DC
  Administered 2020-05-17 – 2020-05-24 (×18): 10 mg via ORAL
  Filled 2020-05-16 (×19): qty 2

## 2020-05-16 MED ORDER — SODIUM CHLORIDE 0.9 % IV SOLN
250.0000 mL | INTRAVENOUS | Status: DC
  Administered 2020-05-16: 1000 mL via INTRAVENOUS
  Administered 2020-05-21: 500 mL via INTRAVENOUS

## 2020-05-16 MED ORDER — ORAL CARE MOUTH RINSE
15.0000 mL | Freq: Two times a day (BID) | OROMUCOSAL | Status: DC
  Administered 2020-05-16 – 2020-05-20 (×8): 15 mL via OROMUCOSAL

## 2020-05-16 MED ORDER — LACTATED RINGERS IV BOLUS
1000.0000 mL | Freq: Once | INTRAVENOUS | Status: AC
  Administered 2020-05-16: 1000 mL via INTRAVENOUS

## 2020-05-16 MED ORDER — POTASSIUM CHLORIDE 2 MEQ/ML IV SOLN
INTRAVENOUS | Status: DC
  Filled 2020-05-16 (×5): qty 1000

## 2020-05-16 MED ORDER — NOREPINEPHRINE 4 MG/250ML-% IV SOLN
2.0000 ug/min | INTRAVENOUS | Status: DC
  Administered 2020-05-16: 13 ug/min via INTRAVENOUS
  Administered 2020-05-17: 10 ug/min via INTRAVENOUS
  Administered 2020-05-17: 9 ug/min via INTRAVENOUS
  Administered 2020-05-17: 8 ug/min via INTRAVENOUS
  Administered 2020-05-18: 6 ug/min via INTRAVENOUS
  Filled 2020-05-16 (×5): qty 250

## 2020-05-16 MED ORDER — LEVOTHYROXINE SODIUM 100 MCG/5ML IV SOLN
50.0000 ug | Freq: Every day | INTRAVENOUS | Status: DC
  Administered 2020-05-19 – 2020-06-01 (×14): 50 ug via INTRAVENOUS
  Filled 2020-05-16 (×16): qty 5

## 2020-05-16 MED ORDER — CHLORHEXIDINE GLUCONATE CLOTH 2 % EX PADS
6.0000 | MEDICATED_PAD | Freq: Every day | CUTANEOUS | Status: DC
  Administered 2020-05-16 – 2020-06-10 (×25): 6 via TOPICAL

## 2020-05-16 MED ORDER — MAGNESIUM SULFATE 2 GM/50ML IV SOLN
2.0000 g | Freq: Once | INTRAVENOUS | Status: AC
  Administered 2020-05-16: 2 g via INTRAVENOUS
  Filled 2020-05-16: qty 50

## 2020-05-16 MED ORDER — POTASSIUM CHLORIDE 10 MEQ/100ML IV SOLN
INTRAVENOUS | Status: AC
  Filled 2020-05-16: qty 100

## 2020-05-16 MED ORDER — KCL-LACTATED RINGERS 20 MEQ/L IV SOLN
INTRAVENOUS | Status: DC
  Filled 2020-05-16 (×2): qty 1000

## 2020-05-16 MED ORDER — ALBUMIN HUMAN 25 % IV SOLN
12.5000 g | Freq: Once | INTRAVENOUS | Status: AC
  Administered 2020-05-16: 12.5 g via INTRAVENOUS
  Filled 2020-05-16: qty 50

## 2020-05-16 MED ORDER — ACETAMINOPHEN 650 MG RE SUPP
650.0000 mg | Freq: Four times a day (QID) | RECTAL | Status: DC | PRN
  Administered 2020-05-20: 650 mg via RECTAL

## 2020-05-16 MED ORDER — NOREPINEPHRINE 4 MG/250ML-% IV SOLN
INTRAVENOUS | Status: AC
  Administered 2020-05-16: 2 ug/min via INTRAVENOUS
  Filled 2020-05-16: qty 250

## 2020-05-16 NOTE — ED Notes (Addendum)
Admitting NP messaged about pt's decreasing BP's and MAP of <64.  New orders placed, see MAR.

## 2020-05-16 NOTE — ED Notes (Signed)
Admitting NP aware of critical labs: 3.1 lactic and 2.4 K.

## 2020-05-16 NOTE — ED Notes (Signed)
This RN and Lurena Joiner, RN cleaned and dried pt of incontinent stool and urine episode. Dsg on pt's sacrum changed.  Pt repositioned and comfortable, warm blankets given.

## 2020-05-16 NOTE — Progress Notes (Signed)
PROGRESS NOTE    Joanne [name]  PPJ:[id number] DOB: 08/17/1971 DOA: 05/15/2020 PCP: Patient, No Pcp Per    Brief Narrative:  Joanne [name] is an 49 y.o. female with PMH significant for TBI requiring chronic trach who lives in a SNF who is now brought to the ED for fever and difficulty obtaining O2 saturations.  Of note patient was recently discharged after treatment for septic shock secondary to UTI and possible pneumonia is now readmitted for recurrent sepsis.  This course was complicated by acute kidney injury, hypernatremia and ARDS.  Patient is unable to provide me a history.  History is entirely per ED documentation and chart review  In the emergency department, the patient was noted to be hypotensive, tachycardic and febrile.  She was given 1 L normal saline bolus and started on vancomycin and Zosyn.  She was also noted to be hyponatremic which seems to be somewhat of a chronic condition for her as she was also hyponatremic on her last admission in May.  Chest x-ray was done which was read as negative.   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  On presentation, temperature 101.1 with elevated WBC count of 22.1.  Lactic acid 3.9.  Urinalysis negative nitrite, small leukoesterase, rare bacteria 6-10 WBCs.  Chest x-ray with no acute cardiopulmonary disease process.  Patient with known large sacral decubitus ulcer stage IV and CT pelvis with contrast shows large sacral acute ulcer with evidence of osteomyelitis of the sacrum and coccyx without signs of abscess.  Patient was aggressively fluid  resuscitated with 4 L on admission; but unfortunately still remains hypotensive.  Received albumin 12.5g IV. --WBC 22.1>22.8 --lactic acid 5.1>3.9>3.1 --Procalcitonin: Pending --Blood cultures x2: Pending --Urine culture: Pending --PCCM consulted for assistance given patient's guardian wishes to continue aggressive measures --Start peripheral norepinephrine until definitive central access obtained --Continue empiric antibiotics with vancomycin and cefepime, pharmacy consulted for dosing/monitoring --Supportive care --Palliative care consult for assistance with goals of care and medical decision making --Ultimately extremely poor/grave prognosis which was conveyed to patient's guardian  Hypernatremia Patient presenting with sodium of 166, likely secondary to sepsis as above coupled with dehydration and poor oral intake in the preceding days. --Na 166>158 --Continue D5W at 74mL per hour --Continue to monitor sodium level every 6 hours, avoid over-correction, no more than 8-10 meq/24h  Severe hypokalemia Potassium 2.4.  Magnesium 1.8.  Will replete IV with KCl 10 mEq IV x6 this morning. --will obtain BMP following potassium runs --Monitor on telemetry  Acute renal failure Creatinine 1.56 on admission.  Etiology likely secondary to prerenal azotemia secondary to severe dehydration versus ATN from septic shock as above. --Cr 1.56>1.12 --Supportive care with IV fluids and antibiotics --Avoid nephrotoxins, renally dose all medications --Follow renal function closely daily  Hypothyroidism On levothyroxine 100 mcg p.o. daily at home. --Check TSH/free T4 --Levothyroxine 50 mcg IV daily  Chronic pain syndrome --Hold pain medications secondary to hypotension --Tylenol PR as needed  Hx traumatic brain injury Patient presenting from SNF with history of TBI, trach dependent.  Ultimately extremely poor prognosis given her frailty, large sacral decubitus ulcer with  extension to  bone/osteomyelitis. --Supportive care --Discussed with patient's guardian, continues to wish for aggressive measures at this time since this was "the patient's wishes". --Palliative care consultation for assistance with goals of care and medical decision making  DVT prophylaxis: Lovenox Code Status: Full code -verified once again with patient's guardian this morning Family Communication: Updated patient's guardian, Baird CancerMildred Patterson regarding patient's grave condition  Disposition Plan:  Status is: Inpatient  Remains inpatient appropriate because:Hemodynamically unstable, Persistent severe electrolyte disturbances, Altered mental status, Ongoing diagnostic testing needed not appropriate for outpatient work up, Unsafe d/c plan and IV treatments appropriate due to intensity of illness or inability to take PO   Dispo: The patient is from: SNF              Anticipated d/c is to: To be determined              Anticipated d/c date is: > 3 days              Patient currently is not medically stable to d/c.    Consultants:   PCCM  Procedures:   none  Antimicrobials:   Vancomycin 7/24>>  Cefepime 7/24>>   Subjective: Patient seen and examined in the ED holding area.  Poorly responsive/altered.  No family present at bedside.  Remains significantly hypotensive despite aggressive IV resuscitation with fluids as well as broad-spectrum antibiotics.  Starting vasopressors.  Updated patient's guardian via telephone regarding grim prognosis and concerns the patient will unlikely survive this hospitalization; she wishes to continue with aggressive measures at this time as this was the "patient's wishes".  Discussed with PCCM this morning.  Unable to obtain any further ROS from patient given her mental status.  Objective: Vitals:   05/16/20 0931 05/16/20 1015 05/16/20 1030 05/16/20 1045  BP: (!) 109/98 (!) 75/43 (!) 68/29   Pulse: 59  71   Resp: 21 21 23    Temp:      TempSrc:      SpO2:  100%  (!) 85% 99%  Weight:      Height:        Intake/Output Summary (Last 24 hours) at 05/16/2020 1049 Last data filed at 05/16/2020 0943 Gross per 24 hour  Intake 500 ml  Output 515 ml  Net -15 ml   Filed Weights   05/15/20 1320  Weight: 63.5 kg    Examination:  General exam: Altered, somnolent, responds to painful stimuli, severely cachectic in appearance Respiratory system: Coarse breath sounds bilaterally, diminished bilateral bases, on face tent, trach noted Cardiovascular system: S1 & S2 heard, RRR. No JVD, murmurs, rubs, gallops or clicks. No pedal edema. Gastrointestinal system: Abdomen is nondistended, soft and nontender. No organomegaly or masses felt. Normal bowel sounds heard. GU: Foley catheter noted Central nervous system: Somnolent, responds to painful stimuli Extremities: No peripheral edema Skin: Large sacral decubitus ulcer with extension to bone Psychiatry: Somnolent, altered    Data Reviewed: I have personally reviewed following labs and imaging studies  CBC: Recent Labs  Lab 05/15/20 1329 05/16/20 0510  WBC 22.1* 22.8*  NEUTROABS 19.5*  --   HGB 12.8 9.3*  HCT 42.0 30.3*  MCV 91.7 92.9  PLT 273 166   Basic Metabolic Panel: Recent Labs  Lab 05/15/20 1329 05/16/20 0510  NA 166* 158*  K 2.7* 2.4*  CL 124* 129*  CO2 26 21*  GLUCOSE 133* 183*  BUN 52* 37*  CREATININE 1.56* 1.12*  CALCIUM 8.5* 6.9*  MG  --  1.8  PHOS  --  1.9*   GFR: Estimated Creatinine Clearance: 58.4 mL/min (A) (by C-G formula based on SCr of 1.12 mg/dL (H)). Liver Function Tests: Recent Labs  Lab 05/15/20 1329 05/16/20 0510  AST 38 26  ALT 25 17  ALKPHOS 172* 115  BILITOT 0.8 0.7  PROT 8.6* 5.8*  ALBUMIN 1.5* <1.0*   No results for input(s): LIPASE, AMYLASE in the last 168 hours. No results for input(s): AMMONIA in the last 168 hours. Coagulation Profile: No results for input(s): INR, PROTIME in the last 168 hours. Cardiac Enzymes: No results for  input(s): CKTOTAL, CKMB, CKMBINDEX, TROPONINI in the last 168 hours. BNP (last 3 results) No results for input(s): PROBNP in the last 8760 hours. HbA1C: No results for input(s): HGBA1C in the last 72 hours. CBG: No results for input(s): GLUCAP in the last 168 hours. Lipid Profile: No results for input(s): CHOL, HDL, LDLCALC, TRIG, CHOLHDL, LDLDIRECT in the last 72 hours. Thyroid Function Tests: No results for input(s): TSH, T4TOTAL, FREET4, T3FREE, THYROIDAB in the last 72 hours. Anemia Panel: No results for input(s): VITAMINB12, FOLATE, FERRITIN, TIBC, IRON, RETICCTPCT in the last 72 hours. Sepsis Labs: Recent Labs  Lab 05/15/20 1329 05/15/20 1555 05/16/20 0510  LATICACIDVEN 5.1* 3.9* 3.1*    Recent Results (from the past 240 hour(s))  Blood Culture (routine x 2)     Status: None (Preliminary result)   Collection Time: 05/15/20  1:29 PM   Specimen: BLOOD  Result Value Ref Range Status   Specimen Description BLOOD R ARM  Final   Special Requests   Final    BOTTLES DRAWN AEROBIC AND ANAEROBIC Blood Culture results may not be optimal due to an excessive volume of blood received in culture bottles   Culture   Final    NO GROWTH < 24 HOURS Performed at [facility], [address] [place], [place] [address]    Report Status PENDING  Incomplete  Blood Culture (routine x 2)     Status: None (Preliminary result)   Collection Time: 05/15/20  1:29 PM   Specimen: BLOOD  Result Value Ref Range Status   Specimen Description BLOOD L AC  Final   Special Requests   Final    BOTTLES DRAWN AEROBIC AND ANAEROBIC Blood Culture results may not be optimal due to an inadequate volume of blood received in culture bottles   Culture   Final    NO GROWTH < 24 HOURS Performed at [facility], [address] [place], [place] [address]    Report Status PENDING  Incomplete  SARS Coronavirus 2 by RT PCR (hospital order, performed in [facility] hospital lab) Nasopharyngeal  Nasopharyngeal Swab     Status: None   Collection Time: 05/16/20 12:07 AM   Specimen: Nasopharyngeal Swab  Result Value Ref Range Status   SARS Coronavirus 2 NEGATIVE NEGATIVE Final    Comment: (NOTE) SARS-CoV-2 target nucleic acids are NOT DETECTED.  The SARS-CoV-2 RNA is generally detectable in upper and lower respiratory specimens during the acute phase of infection. The lowest concentration of SARS-CoV-2 viral copies this assay can detect is 250 copies / mL. A negative result does not preclude SARS-CoV-2 infection and should not be used as the sole basis for treatment or other patient management decisions.  A negative result may occur with improper specimen collection / handling, submission of specimen other than nasopharyngeal swab, presence of viral mutation(s) within the areas targeted by this assay, and inadequate number of viral copies (<250  copies / mL). A negative result must be combined with clinical observations, patient history, and epidemiological information.  Fact Sheet for Patients:   BoilerBrush.com.cy  Fact Sheet for Healthcare Providers: https://pope.com/  This test is not yet approved or  cleared by the [place] FDA and has been authorized for detection and/or diagnosis of SARS-CoV-2 by FDA under an Emergency Use Authorization (EUA).  This EUA will remain in effect (meaning this test can be used) for the duration of the COVID-19 declaration under Section 564(b)(1) of the Act, 21 U.S.C. section 360bbb-3(b)(1), unless the authorization is terminated or revoked sooner.  Performed at [facility], [address] [place], [place] [address]          Radiology Studies: CT PELVIS W CONTRAST  Result Date: 05/15/2020 CLINICAL DATA:  Sepsis, anal rectal abscess EXAM: CT PELVIS WITH CONTRAST TECHNIQUE: Multidetector CT imaging of the pelvis was performed using the standard protocol following the  bolus administration of intravenous contrast. CONTRAST:  3mL OMNIPAQUE IOHEXOL 300 MG/ML  SOLN COMPARISON:  03/02/2020 FINDINGS: Urinary Tract: Visualized portions of the kidneys, ureters, and bladder are unremarkable. Bowel: There is no bowel obstruction or ileus. No wall thickening or inflammatory change. Supraumbilical and infraumbilical midline ventral hernia is again noted containing multiple loops of large and small bowel. No evidence of incarceration. Vascular/Lymphatic: No pathologically enlarged lymph nodes. No significant vascular abnormality seen. Reproductive:  Uterus is atrophic.  No adnexal masses. Other: There is no free intraperitoneal fluid or free gas. Large supraumbilical and infraumbilical midline ventral hernias are unchanged. Musculoskeletal: The sacral decubitus ulcer seen on prior study has progressed in the interim, now measuring 4.5 cm and transverse dimension and extending approximately 5.9 cm in craniocaudal length. This large decubitus ulcer extends to the dorsal aspect of the lower sacrum and coccyx, with bony erosion compatible with osteomyelitis. I do not see any fluid collections or abscess. Specifically, no abnormalities at the anorectal junction. There are no acute displaced fractures. Stable postsurgical changes within the left hip. IMPRESSION: 1. Large sacral decubitus ulcer which has progressed significantly since prior study. Extension to the underlying sacrum and coccyx with evidence of osteomyelitis and bone destruction. 2. No evidence of fluid collection or abscess. 3. Stable midline ventral hernia as without evidence of bowel obstruction or strangulation. Electronically Signed   By: Sharlet Salina M.D.   On: 05/15/2020 19:31   DG Chest Port 1 View  Result Date: 05/15/2020 CLINICAL DATA:  Sepsis, hypotension, tachycardia EXAM: PORTABLE CHEST 1 VIEW COMPARISON:  03/03/2020 FINDINGS: Stable heart size and vascularity. Similar right calcified pleural plaques. No new  airspace process, collapse or consolidation. Negative for edema, effusion or pneumothorax. Tracheostomy midline. Scoliosis of the spine noted. Remote cholecystectomy. IMPRESSION: Stable chest exam.  No interval change or acute process. Electronically Signed   By: Judie Petit.  Shick M.D.   On: 05/15/2020 14:24        Scheduled Meds: . aspirin EC  81 mg Oral Daily  . carbamazepine  100 mg Oral BID  . collagenase   Topical Daily  . enoxaparin (LOVENOX) injection  40 mg Subcutaneous Q24H  . famotidine  20 mg Oral BID  . midodrine  5 mg Oral TID WC  . polyethylene glycol  17 g Oral Daily  . sodium chloride flush  10-40 mL Intracatheter Q12H  . tamsulosin  0.4 mg Oral QHS   Continuous Infusions: . sodium chloride 1,000 mL (05/16/20 0826)  . albumin human    . ceFEPime (MAXIPIME) IV  Stopped (05/16/20 0019)  . dextrose Stopped (05/16/20 0411)  . lactated ringers 1,000 mL with potassium chloride 20 mEq infusion 75 mL/hr at 05/16/20 0828  . norepinephrine (LEVOPHED) Adult infusion 10 mcg/min (05/16/20 0930)  . potassium chloride 10 mEq (05/16/20 0943)  . vancomycin Stopped (05/15/20 2232)     LOS: 1 day    Critical Care Time Upon my evaluation, this patient had a high probability of imminent or life-threatening deterioration due to septic shock, severe hypernatremia, severe hypokalemia, which required my direct attention, intervention, and personal management.  I have personally provided 62 minutes of critical care time exclusive of my time spent on separately billable procedures.  Time includes review of laboratory data, radiology results, discussion with consultants, and monitoring for potential decompensation.       Alvira Philips [place], DO [facility] Available via Epic secure chat 7am-7pm After these hours, please refer to coverage provider listed on [email or web address] 05/16/2020, 10:49 AM

## 2020-05-16 NOTE — ED Notes (Signed)
Pt repositioned by this RN and Nurse, children's.

## 2020-05-16 NOTE — Progress Notes (Signed)
IV synthroid does not start until 7/28. This was clarified with MD. Last oral dose was 7/24 per PTA medication reconciliation.

## 2020-05-16 NOTE — ED Notes (Signed)
Pt removed from bedpan, purewick placed back in position. Pt repositioned comfortably.

## 2020-05-16 NOTE — Progress Notes (Signed)
Component 1 d ago  Specimen Description BLOOD L AC   Special Requests BOTTLES DRAWN AEROBIC AND ANAEROBIC Blood Culture results may not be optimal due to an inadequate volume of blood received in culture bottles  Culture Setup Time Organism ID to follow  GRAM POSITIVE COCCI  ANAEROBIC BOTTLE ONLY  CRITICAL RESULT CALLED TO, READ BACK BY AND VERIFIED WITH:  Seba Madole ON 05/16/2020 AT 1605 TIK  Performed at [facility], [address], [place], [place] [address]   Culture GRAM POSITIVE COCCI   Report Status PENDING          Specimen Collected: 05/15/20 13:29 Last Resulted: 05/16/20 16:10

## 2020-05-16 NOTE — Consult Note (Signed)
Pharmacy Antibiotic Note  Joanne [name] is a 49 y.o. female admitted on 05/15/2020 with sepsis and cellulitis.  Pharmacy has been consulted for Cefepime/Vancomycin dosing. Hx-Stage IV sacral decubitus ulcer w/ osteomyelitis of the sacrum and coccyx, chronic trach  Plan: Day 2- Vancomyicin 500mg  q12h per dosing nomogram for sepsis. (Scr improved 1.56 > 1.12 but still within this dose for nomogram)  Day 2- Cefepime 2g q12h (CrCL 30-30ml/min)- borderline for increase w/ Crcl 58    Height: 5\' 7"  (170.2 cm) Weight: 63.5 kg (139 lb 15.9 oz) IBW/kg (Calculated) : 61.6  Temp (24hrs), Avg:99.2 F (37.3 C), Min:97.4 F (36.3 C), Max:101.1 F (38.4 C)  Recent Labs  Lab 05/15/20 1329 05/15/20 1555 05/16/20 0510  WBC 22.1*  --  22.8*  CREATININE 1.56*  --  1.12*  LATICACIDVEN 5.1* 3.9* 3.1*    Estimated Creatinine Clearance: 58.4 mL/min (A) (by C-G formula based on SCr of 1.12 mg/dL (H)).    Allergies  Allergen Reactions   Kaopectate  [Attapulgite] Itching   Thiopental Itching   Tomato     Antimicrobials this admission: Zosyn x 1 7/24  Cefepime 7/24 > Vancomycin 7/24 >  Dose adjustments this admission: NA  Microbiology results: 7/24 BCx/UCx: pending   Thank you for allowing pharmacy to be a part of this patients care.  8/24, PharmD, BCPS Clinical Pharmacist 05/16/2020 12:36 PM

## 2020-05-16 NOTE — Consult Note (Signed)
Name: Joanne [name] MRN: [id number] DOB: August 15, 1971    ADMISSION DATE:  05/15/2020 CONSULTATION DATE: 05/16/2020  REFERRING MD : Rufina Falco, NP   CHIEF COMPLAINT: Hypotension   BRIEF PATIENT DESCRIPTION:  49yo F with TBI , trache status, seizure disorder as well as multiple additional comorbid conditions came in with profound hypotension noted to have a very large sacral decubitus down to the bone with what appears to be bony destruction and osteomyelitis.  PCCM was consulted for management of septic shock likely secondary to large sacral decubitus with vasopressor requirement.  SIGNIFICANT EVENTS/STUDIES:  07/23: Pt admitted to stepdown unit with sepsis but remained housed in the ER pending bed availability  07/24: CT Pelvis revealed large sacral decubitus ulcer which has progressed significantly since prior study. Extension to the underlying sacrum and coccyx with evidence of osteomyelitis and bone destruction. No evidence of fluid collection or abscess. Stable midline ventral hernia as without evidence of bowel obstruction or strangulation. 07/24: Pt with hypotension PCCM team consulted to assist with management   HISTORY OF PRESENT ILLNESS:   This is a 49 yo female with a PMH of Traumatic Brain Injury, Chronic Tracheostomy, Seizure Disorder, Klebsiella Pneumonia, Hypothyroidism, Type II Diabetes Mellitus, ARDS, Chronic Combined Systolic and Diastolic CHF, and Acute Renal Failure. She presented to [facility] ER on 07/25 via EMS from [facility] with hypotension and tachycardia.  Per ER notes EMS reported when they arrived at the scene the pt was febrile and they were unable to obtain an accurate O2 sats.  Pt arrived to the ER with NRB in place.  Lab results revealed Na+ 166, K+ 2.7, chloride 124, glucose 133, BUN 52, creatinine 1.56, calcium 8.5, anion gap 16, alk phos 172, albumin 1.5, lactic acid 5.1, wbc 22.1, possible UTI, COVID-19 negative, and CXR negative.  CT Pelvis revealed  significantly progressing large sacral decubitus ulcer and extension to the underlying sacrum and coccyx with evidence of osteomyelitis and bone destruction.  She received 3L NS bolus, 1L LR bolus, zosyn, and K+ supplementation.  Pt admitted to the stepdown unit per hospitalist team for additional workup and treatment.  Detailed hospital course outlined above under significant events/studies   PAST MEDICAL HISTORY :   has a past medical history of Acute kidney injury (AKI) with acute tubular necrosis (ATN) ([place]), Acute on chronic combined systolic and diastolic CHF (congestive heart failure) ([place]) (06/06/2018), Acute on chronic respiratory failure with hypoxia ([place]) (06/06/2018), ARDS (adult respiratory distress syndrome) ([place]) (06/06/2018), Aspiration pneumonia due to gastric secretions ([place]), Diabetes mellitus ([place]), History of traumatic brain injury, Hypothyroidism, Pneumonia due to Klebsiella pneumoniae ([place]) (06/06/2018), Seizure disorder ([place]), Severe sepsis ([place]) (06/06/2018), Tracheostomy status ([place]) (06/06/2018), and Traumatic brain injury ([place]).  has a past surgical history that includes Tracheostomy. Prior to Admission medications   Medication Sig Start Date End Date Taking? Authorizing Provider  acetaminophen (TYLENOL) 325 MG tablet Take 2 tablets (650 mg total) by mouth every 6 (six) hours as needed for mild pain or fever (or Fever >/= 101). 11/20/19  Yes Wyvonnia Dusky, MD  aspirin 81 MG EC tablet aspirin 81 mg tablet,delayed release  Take 1 tablet every day by oral route.   Yes [provider]  B Complex-C-E-Zn (B COMPLEX-C-E-ZINC) tablet Take by mouth.   Yes [provider]  carbamazepine (TEGRETOL XR) 100 MG 12 hr tablet Take 100 mg by mouth 2 (two) times daily.   Yes [provider]  cetirizine (ZYRTEC) 10 MG tablet Take by mouth.  Yes [provider]  cyanocobalamin 50 MCG tablet Take by mouth.   Yes [provider]  famotidine (PEPCID) 20  MG tablet Take 20 mg by mouth 2 (two) times daily.   Yes [provider]  ferrous sulfate 325 (65 FE) MG tablet Take 325 mg by mouth daily.   Yes [provider]  folic acid (FOLVITE) 1 MG tablet Take 1 mg by mouth daily. 07/28/19  Yes [provider]  levothyroxine (SYNTHROID) 100 MCG tablet Take 100 mcg by mouth daily. 07/18/19  Yes [provider]  loratadine (CLARITIN) 10 MG tablet Take 20 mg by mouth daily.   Yes [provider]  midodrine (PROAMATINE) 5 MG tablet Take 5 mg by mouth 3 (three) times daily with meals.  05/06/20  Yes [provider]  MULTIPLE VITAMIN PO Take 1 tablet by mouth daily.   Yes [provider]  Neomy-Bacit-Polymyx-Pramoxine 1 % OINT Apply topically. Apply to (L) Lower buttock/At fold typically every evening shift for wound. Cover with gauze/conversite; Change QD 'til healed.   Yes [provider]  oxyCODONE (OXY IR/ROXICODONE) 5 MG immediate release tablet Take 1 tablet (5 mg total) by mouth every 4 (four) hours as needed for moderate pain. 03/16/20  Yes Sreenath, Sudheer B, MD  polyethylene glycol powder (GLYCOLAX/MIRALAX) 17 GM/SCOOP powder Take 17 g by mouth daily.   Yes [provider]  Polyvinyl Alcohol-Povidone PF 1.4-0.6 % SOLN Place 1 drop into both eyes 3 (three) times daily.   Yes [provider]  SANTYL ointment Apply topically. 05/12/20  Yes [provider]  tamsulosin (FLOMAX) 0.4 MG CAPS capsule Take 0.4 mg by mouth at bedtime.  06/16/19  Yes [provider]  traMADol (ULTRAM) 50 MG tablet Take 25 mg by mouth 3 (three) times daily. 08/05/19  Yes [provider]   Allergies  Allergen Reactions  . Kaopectate  [Attapulgite] Itching  . Thiopental Itching  . Tomato     FAMILY HISTORY:  Family history is unknown by patient. SOCIAL HISTORY:  reports that she has never smoked. She has never used smokeless tobacco. She reports previous alcohol use.  She reports previous drug use.  REVIEW OF SYSTEMS:   Unable to obtain due to TBI with cognitive impairment  SUBJECTIVE:   VITAL SIGNS: Temp:  [97.4 F (36.3 C)-101.1 F (38.4 C)] 97.4 F (36.3 C) (07/24 2050) Pulse Rate:  [72-120] 72 (07/25 0620) Resp:  [18-30] 18 (07/25 0620) BP: (74-101)/(38-78) 85/52 (07/25 0620) SpO2:  [90 %-100 %] 98 % (07/25 0620) Weight:  [63.5 kg] 63.5 kg (07/24 1320)  PHYSICAL EXAMINATION: General:  NAD s/p TBI age appropriate Neuro:  Paraplegic with TBI HEENT: Extraocular muscles are intact no periorbital edema pupils are equal Cardiovascular: Sinus tachycardia without appreciable murmurs Lungs: Mildly rhonchorous breath sounds without wheezing Abdomen: Positive bowel sounds, nonobese Musculoskeletal: Paraplegic Skin: Large sacral decubitus with bony destruction of the sacral spine present on admission  Recent Labs  Lab 05/15/20 1329 05/16/20 0510  NA 166* 158*  K 2.7* 2.4*  CL 124* 129*  CO2 26 21*  BUN 52* 37*  CREATININE 1.56* 1.12*  GLUCOSE 133* 183*   Recent Labs  Lab 05/15/20 1329 05/16/20 0510  HGB 12.8 9.3*  HCT 42.0 30.3*  WBC 22.1* 22.8*  PLT 273 166   CT PELVIS W CONTRAST  Result Date: 05/15/2020 CLINICAL DATA:  Sepsis, anal rectal abscess EXAM: CT PELVIS WITH CONTRAST TECHNIQUE: Multidetector CT imaging of the pelvis was performed using  the standard protocol following the bolus administration of intravenous contrast. CONTRAST:  94m OMNIPAQUE IOHEXOL 300 MG/ML  SOLN COMPARISON:  03/02/2020 FINDINGS: Urinary Tract: Visualized portions of the kidneys, ureters, and bladder are unremarkable. Bowel: There is no bowel obstruction or ileus. No wall thickening or inflammatory change. Supraumbilical and infraumbilical midline ventral hernia is again noted containing multiple loops of large and small bowel. No evidence of incarceration. Vascular/Lymphatic: No pathologically enlarged lymph nodes. No significant vascular abnormality seen.  Reproductive:  Uterus is atrophic.  No adnexal masses. Other: There is no free intraperitoneal fluid or free gas. Large supraumbilical and infraumbilical midline ventral hernias are unchanged. Musculoskeletal: The sacral decubitus ulcer seen on prior study has progressed in the interim, now measuring 4.5 cm and transverse dimension and extending approximately 5.9 cm in craniocaudal length. This large decubitus ulcer extends to the dorsal aspect of the lower sacrum and coccyx, with bony erosion compatible with osteomyelitis. I do not see any fluid collections or abscess. Specifically, no abnormalities at the anorectal junction. There are no acute displaced fractures. Stable postsurgical changes within the left hip. IMPRESSION: 1. Large sacral decubitus ulcer which has progressed significantly since prior study. Extension to the underlying sacrum and coccyx with evidence of osteomyelitis and bone destruction. 2. No evidence of fluid collection or abscess. 3. Stable midline ventral hernia as without evidence of bowel obstruction or strangulation. Electronically Signed   By: MRanda NgoM.D.   On: 05/15/2020 19:31   DG Chest Port 1 View  Result Date: 05/15/2020 CLINICAL DATA:  Sepsis, hypotension, tachycardia EXAM: PORTABLE CHEST 1 VIEW COMPARISON:  03/03/2020 FINDINGS: Stable heart size and vascularity. Similar right calcified pleural plaques. No new airspace process, collapse or consolidation. Negative for edema, effusion or pneumothorax. Tracheostomy midline. Scoliosis of the spine noted. Remote cholecystectomy. IMPRESSION: Stable chest exam.  No interval change or acute process. Electronically Signed   By: MJerilynn Mages  Shick M.D.   On: 05/15/2020 14:24    Media Information   Document Information  Photos    05/16/2020 17:05  Attached To:  Hospital Encounter on 05/15/20  Source Information  AOttie Glazier MD  [facility]        ASSESSMENT / PLAN:  Sepsis likely secondary to osteomyelitis from  unstageable sacral decubitus   -Present on admission   -Lactate is elevated status post IVF resuscitation   -Continue with current antimicrobial regimen   -[facility] due to profound hypotension   -Random cortisol level   -Procalcitonin trend   -CVP trend   -Blood cultures have been collected and sent   5.9cm sacral decubitus with bony destruction of sacral spine  -Surgical consultation placed - Dr. CTharon Aquasinput  - wound care consultation  - c/w IV antibiotics   Tracheostomy status  -Chronic Klebsiella colonization  -Monitor for mucopurulent exudation from trach site-respiratory culture  -Repeat CXR today as above  Large Ventral hernia   - lactate is elevated    - CT imaging without evidence of strangulation or obstruction per radiology read   -have discussed with surgery team   Hypernatremia   Likely due to dehydration    - free water deficit 2.1L    Hypokalemia    Active repletion   pharmD consult   Combined systolic and diastolic chronic heart failure   -Restart home regimen Dietary consultation   -On midodrine p.o. 3 times daily 5 mg at home   TBI with seizure disorder  -Continue Tegretol as  prescribed  -Chronic pain syndrome-continue Roxicodone 5 mg  Acute on chronic renal insufficiency  -Due to septic nephropathy  -DC nonessential nephrotoxins  -IV fluid resuscitation  -Consider renal consultation if continues to worsen   Hypothyroidism  -Continue home Synthroid   GI DVT prophylaxis  -Renally dosed Lovenox  -Protonix 40 . IV daily   Critical care provider statement:    Critical care time (minutes):  109   Critical care time was exclusive of:  Separately billable procedures and  treating other patients   Critical care was necessary to treat or prevent imminent or  life-threatening deterioration of the following conditions:   Septic shock, large sacral decubitus,  traumatic brain injury with seizure disorder, hypothyroidism, chronic pain syndrome, combined systolic and diastolic heart failure, hypothyroidism, multiple comorbid conditions   Critical care was time spent personally by me on the following  activities:  Development of treatment plan with patient or surrogate,  discussions with consultants, evaluation of patient's response to  treatment, examination of patient, obtaining history from patient or  surrogate, ordering and performing treatments and interventions, ordering  and review of laboratory studies and re-evaluation of patient's condition   I assumed direction of critical care for this patient from another  provider in my specialty: no      Ottie Glazier, M.D.  Pulmonary & [place]

## 2020-05-16 NOTE — ED Notes (Addendum)
Pt c/o "feeling funny". This RN increased pt O2 to 4L from 2L. Pt O2 sat in 70s. Pt O2 sat increased to 100% on 4L, pt no longer states feeling funny.

## 2020-05-17 ENCOUNTER — Inpatient Hospital Stay: Payer: Self-pay

## 2020-05-17 DIAGNOSIS — Z515 Encounter for palliative care: Secondary | ICD-10-CM

## 2020-05-17 DIAGNOSIS — Z789 Other specified health status: Secondary | ICD-10-CM

## 2020-05-17 DIAGNOSIS — R531 Weakness: Secondary | ICD-10-CM

## 2020-05-17 DIAGNOSIS — Z7189 Other specified counseling: Secondary | ICD-10-CM

## 2020-05-17 LAB — CBC
HCT: 28.2 % — ABNORMAL LOW (ref 36.0–46.0)
Hemoglobin: 9.2 g/dL — ABNORMAL LOW (ref 12.0–15.0)
MCH: 28.8 pg (ref 26.0–34.0)
MCHC: 32.6 g/dL (ref 30.0–36.0)
MCV: 88.4 fL (ref 80.0–100.0)
Platelets: 163 10*3/uL (ref 150–400)
RBC: 3.19 MIL/uL — ABNORMAL LOW (ref 3.87–5.11)
RDW: 18.6 % — ABNORMAL HIGH (ref 11.5–15.5)
WBC: 21.9 10*3/uL — ABNORMAL HIGH (ref 4.0–10.5)
nRBC: 0.6 % — ABNORMAL HIGH (ref 0.0–0.2)

## 2020-05-17 LAB — BASIC METABOLIC PANEL
Anion gap: 7 (ref 5–15)
Anion gap: 5 (ref 5–15)
Anion gap: 8 (ref 5–15)
BUN: 27 mg/dL — ABNORMAL HIGH (ref 6–20)
BUN: 25 mg/dL — ABNORMAL HIGH (ref 6–20)
BUN: 21 mg/dL — ABNORMAL HIGH (ref 6–20)
BUN: 20 mg/dL (ref 6–20)
CO2: 19 mmol/L — ABNORMAL LOW (ref 22–32)
CO2: 17 mmol/L — ABNORMAL LOW (ref 22–32)
CO2: 18 mmol/L — ABNORMAL LOW (ref 22–32)
CO2: 15 mmol/L — ABNORMAL LOW (ref 22–32)
Calcium: 7.2 mg/dL — ABNORMAL LOW (ref 8.9–10.3)
Calcium: 6.9 mg/dL — ABNORMAL LOW (ref 8.9–10.3)
Calcium: 6.9 mg/dL — ABNORMAL LOW (ref 8.9–10.3)
Calcium: 7.1 mg/dL — ABNORMAL LOW (ref 8.9–10.3)
Chloride: >130 mmol/L (ref 98–111)
Chloride: 129 mmol/L — ABNORMAL HIGH (ref 98–111)
Chloride: 127 mmol/L — ABNORMAL HIGH (ref 98–111)
Chloride: 128 mmol/L — ABNORMAL HIGH (ref 98–111)
Creatinine, Ser: 0.91 mg/dL (ref 0.44–1.00)
Creatinine, Ser: 0.73 mg/dL (ref 0.44–1.00)
Creatinine, Ser: 0.89 mg/dL (ref 0.44–1.00)
Creatinine, Ser: 0.82 mg/dL (ref 0.44–1.00)
GFR calc Af Amer: >60 mL/min (ref >60)
GFR calc Af Amer: >60 mL/min (ref >60)
GFR calc Af Amer: >60 mL/min (ref >60)
GFR calc Af Amer: >60 mL/min (ref >60)
GFR calc non Af Amer: >60 mL/min (ref >60)
GFR calc non Af Amer: >60 mL/min (ref >60)
GFR calc non Af Amer: >60 mL/min (ref >60)
GFR calc non Af Amer: >60 mL/min (ref >60)
Glucose, Bld: 139 mg/dL — ABNORMAL HIGH (ref 70–99)
Glucose, Bld: 112 mg/dL — ABNORMAL HIGH (ref 70–99)
Glucose, Bld: 96 mg/dL (ref 70–99)
Glucose, Bld: 110 mg/dL — ABNORMAL HIGH (ref 70–99)
Potassium: 3.8 mmol/L (ref 3.5–5.1)
Potassium: 3.7 mmol/L (ref 3.5–5.1)
Potassium: 3.9 mmol/L (ref 3.5–5.1)
Potassium: 4.4 mmol/L (ref 3.5–5.1)
Sodium: 156 mmol/L — ABNORMAL HIGH (ref 135–145)
Sodium: 153 mmol/L — ABNORMAL HIGH (ref 135–145)
Sodium: 150 mmol/L — ABNORMAL HIGH (ref 135–145)
Sodium: 151 mmol/L — ABNORMAL HIGH (ref 135–145)

## 2020-05-17 LAB — LACTIC ACID, PLASMA: Lactic Acid, Venous: 2.7 mmol/L (ref 0.5–1.9)

## 2020-05-17 LAB — PROCALCITONIN: Procalcitonin: 0.37 ng/mL

## 2020-05-17 MED ORDER — SODIUM CHLORIDE 0.9 % IV SOLN
2.0000 g | Freq: Three times a day (TID) | INTRAVENOUS | Status: DC
  Administered 2020-05-17 – 2020-05-18 (×2): 2 g via INTRAVENOUS
  Filled 2020-05-17 (×4): qty 2

## 2020-05-17 MED ORDER — MORPHINE SULFATE (PF) 2 MG/ML IV SOLN
INTRAVENOUS | Status: AC
  Administered 2020-05-17: 1 mg via INTRAVENOUS
  Filled 2020-05-17: qty 1

## 2020-05-17 MED ORDER — MORPHINE SULFATE (PF) 2 MG/ML IV SOLN
1.0000 mg | INTRAVENOUS | Status: DC | PRN
  Administered 2020-05-17 – 2020-05-24 (×9): 1 mg via INTRAVENOUS
  Filled 2020-05-17 (×9): qty 1

## 2020-05-17 MED ORDER — VANCOMYCIN HCL 750 MG/150ML IV SOLN
750.0000 mg | Freq: Two times a day (BID) | INTRAVENOUS | Status: DC
  Administered 2020-05-17 – 2020-05-19 (×4): 750 mg via INTRAVENOUS
  Filled 2020-05-17 (×6): qty 150

## 2020-05-17 MED ORDER — LACTATED RINGERS IV BOLUS
1000.0000 mL | INTRAVENOUS | Status: AC
  Administered 2020-05-17 (×2): 1000 mL via INTRAVENOUS

## 2020-05-17 NOTE — Consult Note (Signed)
**Note Joanne [name]** Pharmacy Antibiotic Note  Joanne [name] is a 49 y.o. female admitted on 05/15/2020 with sepsis and cellulitis. Pharmacy has been consulted for Cefepime/Vancomycin dosing. Patient with sacral decubitus ulcer w/ osteomyelitis of the sacrum and coccyx. Recent h/o strep pyogenes and E.coli bacteremia, completed treatment.  Blood culture 1/4 bottles GPC, no identification on BCID. This could likely represent contamination but will await further identification.   Plan: Renal function improved. Will change vancomycin to 750 mg IV q12h.  Change cefepime to 2 g IV q8h    Height: 5\' 4"  (162.6 cm) Weight: 61.3 kg (135 lb 2.3 oz) IBW/kg (Calculated) : 54.7  Temp (24hrs), Avg:96.9 F (36.1 C), Min:95.4 F (35.2 C), Max:98.2 F (36.8 C)  Recent Labs  Lab 05/15/20 1329 05/15/20 1329 05/15/20 1555 05/16/20 0510 05/16/20 1716 05/17/20 0038 05/17/20 0519 05/17/20 0927 05/17/20 1233  WBC 22.1*  --   --  22.8*  --   --  21.9*  --   --   CREATININE 1.56*   < >  --  1.12* 0.95 0.91 0.73  --  0.89  LATICACIDVEN 5.1*  --  3.9* 3.1* 2.8*  --   --  2.7*  --    < > = values in this interval not displayed.    Estimated Creatinine Clearance: 65.3 mL/min (by C-G formula based on SCr of 0.89 mg/dL).    Allergies  Allergen Reactions  . Kaopectate  [Attapulgite] Itching  . Thiopental Itching  . Tomato     Antimicrobials this admission: Zosyn x 1 7/24  Cefepime 7/24 > Vancomycin 7/24 >    Microbiology results: 7/24 BCx: 1/4 GPC (identification pending) 7/24 UCx: insignificant growth 7/25 MRSA PCR: negative  Thank you for allowing pharmacy to be a part of this patient's care.  8/25, PharmD Clinical Pharmacist 05/17/2020 2:31 PM

## 2020-05-17 NOTE — Progress Notes (Signed)
PROGRESS NOTE    VERLEY PARISEAU  UJW:[id number] DOB: 03-27-1971 DOA: 05/15/2020 PCP: Patient, No Pcp Per    Brief Narrative:  ELYN KROGH is an 49 y.o. female with PMH significant for TBI requiring chronic trach who lives in a SNF who is now brought to the ED for fever and difficulty obtaining O2 saturations.  Of note patient was recently discharged after treatment for septic shock secondary to UTI and possible pneumonia is now readmitted for recurrent sepsis.  This course was complicated by acute kidney injury, hypernatremia and ARDS.  Patient is unable to provide me a history.  History is entirely per ED documentation and chart review  In the emergency department, the patient was noted to be hypotensive, tachycardic and febrile.  She was given 1 L normal saline bolus and started on vancomycin and Zosyn.  She was also noted to be hyponatremic which seems to be somewhat of a chronic condition for her as she was also hyponatremic on her last admission in May.  Chest x-ray was done which was read as negative.   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  On presentation, temperature 101.1 with elevated WBC count of 22.1.  Lactic acid 3.9.  Urinalysis negative nitrite, small leukoesterase, rare bacteria 6-10 WBCs.  Chest x-ray with no acute cardiopulmonary disease process.  Patient with known large sacral decubitus ulcer stage IV and CT pelvis with contrast shows large sacral acute ulcer with  evidence of osteomyelitis of the sacrum and coccyx without signs of abscess.  Patient was aggressively fluid resuscitated with 4 L on admission; but unfortunately still remains hypotensive.  Received albumin 12.5g IV.  Urine culture with insignificant growth. --PCCM following, appreciate assistance --WBC 22.1>22.8 --lactic acid 5.1>3.9>3.1 --Procalcitonin: 0.47>0.37 --Blood cultures x2: Positive for gram-positive cocci, BCID strep pyogenes --Continue norepinephrine, titrate to maintain MAP greater than 65 --Continue empiric antibiotics with vancomycin and cefepime, pharmacy consulted for dosing/monitoring --Repeat blood cultures in the a.m. --Obtain infectious disease consultation tomorrow for further recommendations --Supportive care --Palliative care discussed prognosis with patient's guardian today, continues to request aggressive measures --Ultimately extremely poor/grave prognosis which was conveyed to patient's guardian  Hypernatremia Patient presenting with sodium of 166, likely secondary to sepsis as above coupled with dehydration and poor oral intake in the preceding days. --Na [phone] --Continue D5W at 87mL per hour --Continue to monitor sodium level every 6 hours, avoid over-correction, no more than 8-10 meq/24h  Severe hypokalemia Repleted.  Potassium 3.7 this morning --Monitor on telemetry  Acute renal failure: Resolved Creatinine 1.56 on admission.  Etiology likely secondary to prerenal azotemia secondary to severe dehydration versus ATN from septic shock as above. --Cr 1.56>1.12>0.73 --Supportive care with IV fluids and antibiotics --Avoid nephrotoxins, renally dose all medications --Follow renal function closely daily  Hypothyroidism On levothyroxine 100 mcg p.o. daily at home.  TSH 2.588, free T4 0.53. --Levothyroxine 50 mcg IV daily  Chronic pain syndrome --Hold pain medications secondary to hypotension --Tylenol PR as needed  Hx traumatic brain  injury Patient presenting from SNF with history of TBI, trach dependent.  Ultimately extremely poor prognosis given her frailty, large sacral decubitus ulcer with extension to bone/osteomyelitis. --Supportive care --Palliative care consulted and patient's guardian continues to wish for aggressive measures at this time since this was "the patient's wishes".  DVT prophylaxis: Lovenox Code Status: Full code  Family Communication: Updated patient's guardian, [name] regarding patient's continued grave condition  Disposition Plan:  Status is: Inpatient  Remains inpatient appropriate because:Hemodynamically unstable, Persistent severe electrolyte disturbances, Altered mental status, Ongoing diagnostic testing needed not appropriate for outpatient work up, Unsafe d/c plan and IV treatments appropriate due to intensity of illness or inability to take PO   Dispo: The patient is from: SNF              Anticipated d/c is to: To be determined              Anticipated d/c date is: > 3 days              Patient currently is not medically stable to d/c.    Consultants:   PCCM  Procedures:   none  Antimicrobials:   Vancomycin 7/24>>  Cefepime 7/24>>   Subjective: Patient seen and examined in the ED holding area.  Poorly responsive/altered.  No family present at bedside.  Remains significantly hypotensive despite aggressive IV resuscitation with fluids as well as broad-spectrum antibiotics.  Starting vasopressors.  Updated patient's guardian via telephone regarding grim prognosis and concerns the patient will unlikely survive this hospitalization; she wishes to continue with aggressive measures at this time as this was the "patient's wishes".  Discussed with PCCM this morning.  Unable to obtain any further ROS from patient given her mental status.  Objective: Vitals:   05/17/20 1430 05/17/20 1445 05/17/20 1500 05/17/20 1515  BP: (!) 90/61 (!) 91/53 (!) 93/49 (!) 91/48  Pulse: 95  95 97 94  Resp: (!) 39 (!) 40 (!) 26 (!) 47  Temp:      TempSrc:      SpO2: 93% 92% 90% 94%  Weight:      Height:        Intake/Output Summary (Last 24 hours) at 05/17/2020 1617 Last data filed at 05/17/2020 1426 Gross per 24 hour  Intake 7237.04 ml  Output 2375 ml  Net 4862.04 ml   Filed Weights   05/15/20 1320 05/16/20 1420  Weight: 63.5 kg 61.3 kg    Examination:  General exam: Altered, somnolent, responds to painful stimuli, severely cachectic in appearance Respiratory system: Coarse breath sounds bilaterally, diminished bilateral bases, on face tent, trach noted Cardiovascular system: S1 & S2 heard, RRR. No JVD, murmurs, rubs, gallops or clicks. No pedal edema. Gastrointestinal system: Abdomen is nondistended, soft and nontender. No organomegaly or masses felt. Normal bowel sounds heard. GU: Foley catheter noted Central nervous system: Somnolent, responds to painful stimuli Extremities: No peripheral edema Skin: Large sacral decubitus ulcer with extension to bone Psychiatry: Somnolent, altered      Data Reviewed: I have personally reviewed following labs and imaging studies  CBC: Recent Labs  Lab 05/15/20 1329 05/16/20 0510 05/17/20 0519  WBC 22.1* 22.8* 21.9*  NEUTROABS 19.5*  --   --   HGB 12.8 9.3* 9.2*  HCT 42.0 30.3* 28.2*  MCV 91.7 92.9 88.4  PLT 273 166 163   Basic Metabolic Panel: Recent Labs  Lab 05/16/20 0510 05/16/20 1716 05/17/20 0038 05/17/20 0519 05/17/20 1233  NA 158* 153* 156* 153* 150*  K 2.4* 5.0 3.8 3.7 3.9  CL 129* 130* >130* 129* 127*  CO2 21* 16* 19* 17* 18*  GLUCOSE 183* 162* 139* 112* 96  BUN 37* 30* 27* 25* 21*  CREATININE 1.12* 0.95 0.91 0.73 0.89  CALCIUM 6.9* 7.2* 7.2* 6.9* 6.9*  MG 1.8  --   --   --   --   PHOS 1.9*  --   --   --   --    GFR: Estimated Creatinine Clearance: 65.3 mL/min (by C-G formula based on SCr of 0.89 mg/dL). Liver Function Tests: Recent Labs  Lab 05/15/20 1329 05/16/20 0510  AST 38 26   ALT 25 17  ALKPHOS 172* 115  BILITOT 0.8 0.7  PROT 8.6* 5.8*  ALBUMIN 1.5* <1.0*   No results for input(s): LIPASE, AMYLASE in the last 168 hours. No results for input(s): AMMONIA in the last 168 hours. Coagulation Profile: No results for input(s): INR, PROTIME in the last 168 hours. Cardiac Enzymes: No results for input(s): CKTOTAL, CKMB, CKMBINDEX, TROPONINI in the last 168 hours. BNP (last 3 results) No results for input(s): PROBNP in the last 8760 hours. HbA1C: No results for input(s): HGBA1C in the last 72 hours. CBG: Recent Labs  Lab 05/16/20 1422  GLUCAP 86   Lipid Profile: No results for input(s): CHOL, HDL, LDLCALC, TRIG, CHOLHDL, LDLDIRECT in the last 72 hours. Thyroid Function Tests: Recent Labs    05/16/20 0510  TSH 2.588  FREET4 0.53*   Anemia Panel: No results for input(s): VITAMINB12, FOLATE, FERRITIN, TIBC, IRON, RETICCTPCT in the last 72 hours. Sepsis Labs: Recent Labs  Lab 05/15/20 1555 05/16/20 0510 05/16/20 1716 05/17/20 0519 05/17/20 0927  PROCALCITON  --  0.47  --  0.37  --   LATICACIDVEN 3.9* 3.1* 2.8*  --  2.7*    Recent Results (from the past 240 hour(s))  Blood Culture (routine x 2)     Status: None (Preliminary result)   Collection Time: 05/15/20  1:29 PM   Specimen: BLOOD  Result Value Ref Range Status   Specimen Description BLOOD R ARM  Final   Special Requests   Final    BOTTLES DRAWN AEROBIC AND ANAEROBIC Blood Culture results may not be optimal due to an excessive volume of blood received in culture bottles   Culture   Final    NO GROWTH 2 DAYS Performed at [facility], [address] [place], [place] [address]    Report Status PENDING  Incomplete  Blood Culture (routine x 2)     Status: None (Preliminary result)   Collection Time: 05/15/20  1:29 PM   Specimen: BLOOD  Result Value Ref Range Status   Specimen Description   Final    BLOOD L AC Performed at [facility], [address],  [place], [place] [address]    Special Requests   Final    BOTTLES DRAWN AEROBIC AND ANAEROBIC Blood Culture results may not be optimal due to an inadequate volume of blood received in culture bottles Performed at [facility], [address] [place], [place] [address]    Culture  Setup Time   Final    Organism ID to follow GRAM POSITIVE COCCI ANAEROBIC BOTTLE ONLY CRITICAL RESULT CALLED TO, READ BACK BY AND VERIFIED WITH: MYRA SLAUGHTER ON 05/16/2020 AT 1605 TIK Performed at [facility], [address], [place], [place] [address]    Culture   Final    GRAM POSITIVE COCCI CULTURE REINCUBATED FOR BETTER GROWTH  Performed at [facility], [address], [place], [place] [address]    Report Status PENDING  Incomplete  Blood Culture ID Panel (Reflexed)     Status: None   Collection Time: 05/15/20  1:29 PM  Result Value Ref Range Status   Enterococcus species NOT DETECTED NOT DETECTED Final   Listeria monocytogenes NOT DETECTED NOT DETECTED Final   Staphylococcus species NOT DETECTED NOT DETECTED Final   Staphylococcus aureus (BCID) NOT DETECTED NOT DETECTED Final   Streptococcus species NOT DETECTED NOT DETECTED Final   Streptococcus agalactiae NOT DETECTED NOT DETECTED Final   Streptococcus pneumoniae NOT DETECTED NOT DETECTED Final   Streptococcus pyogenes NOT DETECTED NOT DETECTED Final   Acinetobacter baumannii NOT DETECTED NOT DETECTED Final   Enterobacteriaceae species NOT DETECTED NOT DETECTED Final   Enterobacter cloacae complex NOT DETECTED NOT DETECTED Final   Escherichia coli NOT DETECTED NOT DETECTED Final   Klebsiella oxytoca NOT DETECTED NOT DETECTED Final   Klebsiella pneumoniae NOT DETECTED NOT DETECTED Final   Proteus species NOT DETECTED NOT DETECTED Final   Serratia marcescens NOT DETECTED NOT DETECTED Final   Haemophilus influenzae NOT DETECTED NOT DETECTED Final   Neisseria meningitidis NOT DETECTED NOT DETECTED Final   Pseudomonas  aeruginosa NOT DETECTED NOT DETECTED Final   Candida albicans NOT DETECTED NOT DETECTED Final   Candida glabrata NOT DETECTED NOT DETECTED Final   Candida krusei NOT DETECTED NOT DETECTED Final   Candida parapsilosis NOT DETECTED NOT DETECTED Final   Candida tropicalis NOT DETECTED NOT DETECTED Final    Comment: Performed at [facility], [address] [place], [place] [address]  Urine culture     Status: Abnormal   Collection Time: 05/15/20  2:03 PM   Specimen: In/Out Cath Urine  Result Value Ref Range Status   Specimen Description   Final    IN/OUT CATH URINE Performed at [facility], [address] [place], [place] [address]    Special Requests   Final    NONE Performed at [facility], [address] [place], [place] [address]    Culture (A)  Final    <10,000 COLONIES/mL INSIGNIFICANT GROWTH Performed at [facility], [address], [place], [place] [address]    Report Status 05/16/2020 FINAL  Final  SARS Coronavirus 2 by RT PCR (hospital order, performed in [facility] hospital lab) Nasopharyngeal Nasopharyngeal Swab     Status: None   Collection Time: 05/16/20 12:07 AM   Specimen: Nasopharyngeal Swab  Result Value Ref Range Status   SARS Coronavirus 2 NEGATIVE NEGATIVE Final    Comment: (NOTE) SARS-CoV-2 target nucleic acids are NOT DETECTED.  The SARS-CoV-2 RNA is generally detectable in upper and lower respiratory specimens during the acute phase of infection. The lowest concentration of SARS-CoV-2 viral copies this assay can detect is 250 copies / mL. A negative result does not preclude SARS-CoV-2 infection and should not be used as the sole basis for treatment or other patient management decisions.  A negative result may occur with improper specimen collection / handling, submission of specimen other than nasopharyngeal swab, presence of viral mutation(s) within the areas targeted by this assay, and inadequate number of viral  copies (<250 copies / mL). A negative result must be combined with clinical observations, patient history, and epidemiological information.  Fact Sheet for Patients:   BoilerBrush.com.cy  Fact Sheet for Healthcare Providers: https://pope.com/  This test is not yet approved or  cleared by the [place] FDA and has been authorized for detection and/or diagnosis of SARS-CoV-2 by FDA under an Emergency Use Authorization (EUA).  This EUA will remain in effect (meaning this test can be used) for the duration of the COVID-19 declaration under Section 564(b)(1) of the Act, 21 U.S.C. section 360bbb-3(b)(1), unless the authorization is terminated or revoked sooner.  Performed at [facility], [address] [place], [place] [address]   MRSA PCR Screening     Status: None   Collection Time: 05/16/20  2:37 PM   Specimen: Nasopharyngeal  Result Value Ref Range Status   MRSA by PCR NEGATIVE NEGATIVE Final    Comment:        The GeneXpert MRSA Assay (FDA approved for NASAL specimens only), is one component of a comprehensive MRSA colonization surveillance program. It is not intended to diagnose MRSA infection nor to guide or monitor treatment for MRSA infections. Performed at [facility], [address] [place], [place] [address]          Radiology Studies: CT PELVIS W CONTRAST  Result Date: 05/15/2020 CLINICAL DATA:  Sepsis, anal rectal abscess EXAM: CT PELVIS WITH CONTRAST TECHNIQUE: Multidetector CT imaging of the pelvis was performed using the standard protocol following the bolus administration of intravenous contrast. CONTRAST:  75mL OMNIPAQUE IOHEXOL 300 MG/ML  SOLN COMPARISON:  03/02/2020 FINDINGS: Urinary Tract: Visualized portions of the kidneys, ureters, and bladder are unremarkable. Bowel: There is no bowel obstruction or ileus. No wall thickening or inflammatory change. Supraumbilical and  infraumbilical midline ventral hernia is again noted containing multiple loops of large and small bowel. No evidence of incarceration. Vascular/Lymphatic: No pathologically enlarged lymph nodes. No significant vascular abnormality seen. Reproductive:  Uterus is atrophic.  No adnexal masses. Other: There is no free intraperitoneal fluid or free gas. Large supraumbilical and infraumbilical midline ventral hernias are unchanged. Musculoskeletal: The sacral decubitus ulcer seen on prior study has progressed in the interim, now measuring 4.5 cm and transverse dimension and extending approximately 5.9 cm in craniocaudal length. This large decubitus ulcer extends to the dorsal aspect of the lower sacrum and coccyx, with bony erosion compatible with osteomyelitis. I do not see any fluid collections or abscess. Specifically, no abnormalities at the anorectal junction. There are no acute displaced fractures. Stable postsurgical changes within the left hip. IMPRESSION: 1. Large sacral decubitus ulcer which has progressed significantly since prior study. Extension to the underlying sacrum and coccyx with evidence of osteomyelitis and bone destruction. 2. No evidence of fluid collection or abscess. 3. Stable midline ventral hernia as without evidence of bowel obstruction or strangulation. Electronically Signed   By: Sharlet SalinaMichael  Brown M.D.   On: 05/15/2020 19:31        Scheduled Meds: . carbamazepine  100 mg Oral BID  . chlorhexidine  15 mL Mouth Rinse BID  . Chlorhexidine Gluconate Cloth  6 each Topical Daily  . collagenase   Topical Daily  . enoxaparin (LOVENOX) injection  40 mg Subcutaneous Q24H  . [START ON 05/19/2020] levothyroxine  50 mcg Intravenous Q0600  . mouth rinse  15 mL Mouth Rinse q12n4p  . midodrine  10 mg Oral TID WC  . polyethylene glycol  17 g Oral Daily  . sodium chloride flush  10-40 mL Intracatheter Q12H   Continuous Infusions: . sodium chloride 1,000 mL (05/16/20 0826)  . ceFEPime  (MAXIPIME) IV    . dextrose 87 mL/hr at 05/17/20 1504  . norepinephrine (LEVOPHED) Adult infusion 8 mcg/min (05/17/20 1503)  .  vancomycin       LOS: 2 days    Critical Care Time Upon my evaluation, this patient had a high probability of imminent or life-threatening deterioration due to septic shock, severe hypernatremia, severe hypokalemia, which required my direct attention, intervention, and personal management.  I have personally provided 41 minutes of critical care time exclusive of my time spent on separately billable procedures.  Time includes review of laboratory data, radiology results, discussion with consultants, and monitoring for potential decompensation.       Alvira Philips [place], DO [facility] Available via Epic secure chat 7am-7pm After these hours, please refer to coverage provider listed on [email or web address] 05/17/2020, 4:17 PM

## 2020-05-17 NOTE — TOC Initial Note (Signed)
Transition of Care [facility]) - Initial/Assessment Note    Patient Details  Name: Joanne [name] MRN: [id number] Date of Birth: [birth year]  [facility] CM/SW Contact:    Margarito Liner, LCSW Phone Number: 05/17/2020, 3:29 PM  Clinical Narrative:  Patient not fully oriented. CSW called patient's guardian Joanne [name], introduced role, and explained that discharge planning would be discussed. She confirmed patient is a long-term resident at [facility] but she would like to get her transferred to a facility closer to the [facility] area if possible. Traveling from [place] to [place] is becoming difficult to manage for Joanne [name]. CSW called [facility] but they are not taking trach patients at this time. Sent referral to other facilities in the area. If we are unable to find another facility while patient is inpatient, she is agreeable to patient returning to [facility] and their social worker working on transfer from there. CSW confirmed with their admissions coordinator. No further concerns. CSW encouraged patient's guardian to contact CSW as needed. CSW will continue to follow patient and her guardian for support and facilitate discharge to SNF once medically stable.                Expected Discharge Plan: Skilled Nursing Facility Barriers to Discharge: Continued Medical Work up   Patient Goals and CMS Choice        Expected Discharge Plan and Services Expected Discharge Plan: Skilled Nursing Facility     Post Acute Care Choice: Skilled Nursing Facility Living arrangements for the past 2 months: Skilled Nursing Facility                                      Prior Living Arrangements/Services Living arrangements for the past 2 months: Skilled Nursing Facility Lives with:: Facility Resident Patient language and need for interpreter reviewed:: Yes Do you feel safe going back to the place where you live?: Yes       Need for Family Participation in Patient Care: Yes (Comment) Care giver support system in place?: Yes (comment)   Criminal Activity/Legal Involvement Pertinent to Current Situation/Hospitalization: No - Comment as needed  Activities of Daily Living      Permission Sought/Granted Permission sought to share information with : Facility Medical sales representative, Guardian    Share Information with NAME: Joanne [name]  Permission granted to share info w AGENCY: SNF  Permission granted to share info w Relationship: Guardian  Permission granted to share info w Contact Information: [phone]  Emotional Assessment Appearance:: Appears stated age Attitude/Demeanor/Rapport: Unable to Assess Affect (typically observed): Unable to Assess Orientation: : Oriented to Self, Oriented to Place Alcohol / Substance Use: Not Applicable Psych Involvement: No (comment)  Admission diagnosis:  Hypernatremia [E87.0] SIRS (systemic inflammatory response syndrome) (HCC) [R65.10] Sepsis (HCC) [A41.9] Patient Active Problem List   Diagnosis Date Noted  . Weakness   . Full code status   . Hypokalemia 05/16/2020  . Severe sepsis with septic shock (HCC) 05/15/2020  . Unstageable pressure ulcer of sacral region (HCC) 03/10/2020  . Goals of care, counseling/discussion   . Palliative care by specialist   . DNR (do not resuscitate) discussion   . Septic shock (HCC) 03/02/2020  . Hypernatremia 11/15/2019  . Thrombocytopenia (HCC) 11/15/2019  . AKI (acute kidney injury) (HCC) 11/15/2019  . HTN (hypertension) 11/15/2019  . Non-insulin dependent type 2 diabetes mellitus (HCC) 11/15/2019  .  Hypothyroidism 11/15/2019  . Severe sepsis with acute organ dysfunction (HCC) 11/15/2019  . Left hemiplegia (HCC) 11/15/2019  . Pressure injury of skin 11/15/2019  . Aspiration pneumonia due to gastric secretions (HCC)   . Acute kidney injury (AKI) with acute tubular necrosis (ATN) (HCC)   . Traumatic brain injury  (HCC)   . Acute on chronic respiratory failure with hypoxia (HCC) 06/06/2018  . 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 Pneumonia due to Klebsiella pneumoniae (HCC) 06/06/2018  . Acute on chronic combined systolic and diastolic CHF (congestive heart failure) (HCC) 06/06/2018  . Sepsis secondary to UTI (HCC) 06/06/2018  . ARDS (adult respiratory distress syndrome) (HCC) 06/06/2018  . Acute on chronic respiratory failure with hypoxia (HCC) 06/06/2018  . 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   PCP:  Patient, No Pcp Per Pharmacy:   Darius Bump, [facility] - [address] [place] [address] Phone: [phone] Fax: [phone]     Social Determinants of Health (SDOH) Interventions    Readmission Risk Interventions Readmission Risk Prevention Plan 03/10/2020 03/03/2020  Transportation Screening Complete Complete  PCP or Specialist Appt within 3-5 Days Complete Complete  HRI or Home Care Consult Complete Complete  Social Work Consult for Recovery Care Planning/Counseling Complete -  Palliative Care Screening Not Applicable Complete  Medication Review Oceanographer) Complete Complete  Some recent data might be hidden

## 2020-05-17 NOTE — Progress Notes (Addendum)
Bedside swallow eval. Report to follow upon completion. Rec NPO except crushed meds in applesauce for now. Once Pt has swallowed, may follow with 2 sips of honey thick liquid. Place PMV over clean trach prior to giving meds. Please used hand over hand assist while Pt takes the sips herself. Pt fatigued very easily and therefore is not reasy for full meals at this time. ST to reassess tomorrow in hopes of Pt having a little more strength and alertness. Pt is familiar to ST and was tolerating a dysphagia 1 diet with nectar thick liquids on last admission. Discussed all with Nsg. ST was present with med distribution at the end of the session today to assist and educate. Oral care at least twice a day. Pt complains of lip pain, added lubricant which she reported helped.

## 2020-05-17 NOTE — Consult Note (Signed)
WOC Nurse Consult Note: Reason for Consult: Interim care orders for patient with Unstageable mid thoracic pressure injury, Stage 4 sacral pressure injury and Deep Tissue Pressure Injury (DTPI) to left posterior heel.  Previously healed pressure injuries (full thickness) are noted to the bilateral ischial tuberosities where there are now scattered full thickness Unstageable pressure injuries. Surgery consult has been placed and is pending for mid thoracic and sacral wounds. Wound type: Pressure, pressure plus moisture Pressure Injury POA: Yes  Measurement: Sacrum: 8cm x 6cm x 3cm central defect with bone palpable, Undermining at 12 o'clock meauring 5cm.  There is a 0.4cm round opening located 4cm above superior wound margin that communicates with central wound Mid thoracic: 9cm x 5cm area with depth obscured by the presence of white/grey stringy nonviable tissue Left posterior heel: 2cm x 2.5cm soft, boggy area of purple discoloration with movable epidermis consistent with deep tissue pressure injury presentation  Wound bed:As noted above Drainage (amount, consistency, odor) As noted above Periwound: feel with discoloration and evidence of previous wound healing, small skin tears noted where adhesives from dressings have contributed to partial thickness wounds on buttocks. Dressing procedure/placement/frequency: Patient is on a mattress replacement with low air loss feature while in ICU.  I have requested bed to be ordered upon transfer to floor. Bilateral pressure redistribution heel boots are requested. Topical care to left posterior heel will be to twice daily paint with betadine to both dry and prevent infection. Feet are to be placed into Prevalon Boots. Wound care to Central Stage 4 PI will be with twice daily saline dressings to obliterate dead place. Wound care to mid thoracic Unstageable pressure injury will be with collagenase (Santyl) for enzymatic debridement.   WOC nursing team will  not follow, but will remain available to this patient, the nursing and medical teams.  Please re-consult if needed. Thanks, Ladona Mow, MSN, RN, GNP, [place]  Pager# [phone]

## 2020-05-17 NOTE — NC FL2 (Signed)
[place] MEDICAID FL2 LEVEL OF CARE SCREENING TOOL     IDENTIFICATION  Patient Name: Joanne [name] Birthdate: December 10, 1970 Sex: female Admission Date (Current Location): 05/15/2020  [place] and [place] Number:  Chiropodist and Address:  [facility], [address] [place], [place] [address]      Provider Number: [id number]  Attending Physician Name and Address:  [place], Alvira Philips, DO  Relative Name and Phone Number:       Current Level of Care: Hospital Recommended Level of Care: Skilled Nursing Facility (with outpatient palliative.) Prior Approval Number:    Date Approved/Denied:   PASRR Number: [id number]  Discharge Plan: SNF (with outpatient palliative.)    Current Diagnoses: Patient Active Problem List   Diagnosis Date Noted  . Weakness   . Full code status   . Hypokalemia 05/16/2020  . Severe sepsis with septic shock (HCC) 05/15/2020  . Unstageable pressure ulcer of sacral region (HCC) 03/10/2020  . Goals of care, counseling/discussion   . Palliative care by specialist   . DNR (do not resuscitate) discussion   . Septic shock (HCC) 03/02/2020  . Hypernatremia 11/15/2019  . Thrombocytopenia (HCC) 11/15/2019  . AKI (acute kidney injury) (HCC) 11/15/2019  . HTN (hypertension) 11/15/2019  . Non-insulin dependent type 2 diabetes mellitus (HCC) 11/15/2019  . Hypothyroidism 11/15/2019  . Severe sepsis with acute organ dysfunction (HCC) 11/15/2019  . Left hemiplegia (HCC) 11/15/2019  . Pressure injury of skin 11/15/2019  . Aspiration pneumonia due to gastric secretions (HCC)   . Acute kidney injury (AKI) with acute tubular necrosis (ATN) (HCC)   . Traumatic brain injury (HCC)   . Acute on chronic respiratory failure with hypoxia (HCC) 06/06/2018  . 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 Pneumonia due to Klebsiella pneumoniae (HCC) 06/06/2018  . Acute on chronic combined systolic and diastolic CHF (congestive heart failure) (HCC)  06/06/2018  . Sepsis secondary to UTI (HCC) 06/06/2018  . ARDS (adult respiratory distress syndrome) (HCC) 06/06/2018  . Acute on chronic respiratory failure with hypoxia (HCC) 06/06/2018  . 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    Orientation RESPIRATION BLADDER Height & Weight     Self, Place  Tracheostomy (6 L at 28%. Currently cuffed.) Continent, Indwelling catheter Weight: 135 lb 2.3 oz (61.3 kg) Height:  5\' 4"  (162.6 cm)  BEHAVIORAL SYMPTOMS/MOOD NEUROLOGICAL BOWEL NUTRITION STATUS   (None)  (TBI) Incontinent Diet (Currently NPO. Follow for discharge recommendations. Speech following.)  AMBULATORY STATUS COMMUNICATION OF NEEDS Skin     Verbally PU Stage and Appropriate Care, Other (Comment) (MASD.)     PU Stage 3 Dressing: BID (Coccyx: Foam)                 Personal Care Assistance Level of Assistance              Functional Limitations Info  Hearing   Hearing Info: Adequate (Nods/gestures appropriately)      SPECIAL CARE FACTORS FREQUENCY                       Contractures Contractures Info: Not present    Additional Factors Info  Code Status, Allergies Code Status Info: Full code Allergies Info: Kaopectate (Attapulgite), Thiopental, Tomato           Current Medications (05/17/2020):  This is the current hospital active medication list Current Facility-Administered Medications  Medication Dose Route Frequency Provider Last Rate Last Admin  . 0.9 %  sodium chloride infusion  250  mL Intravenous Continuous [place], Eric J, DO 20 mL/hr at 05/16/20 0826 1,000 mL at 05/16/20 0826  . acetaminophen (TYLENOL) suppository 650 mg  650 mg Rectal Q6H PRN [place], Alvira Philips, DO      . bisacodyl (DULCOLAX) suppository 10 mg  10 mg Rectal Daily PRN Leandro Reasoner Tublu, MD      . carbamazepine (TEGRETOL XR) 12 hr tablet 100 mg  100 mg Oral BID Leandro Reasoner Tublu, MD   100 mg at 05/15/20 2129  . ceFEPIme (MAXIPIME) 2 g in sodium chloride 0.9 % 100 mL IVPB   2 g Intravenous Q8H [place], Eric J, DO      . chlorhexidine (PERIDEX) 0.12 % solution 15 mL  15 mL Mouth Rinse BID Vida Rigger, MD   15 mL at 05/17/20 0918  . Chlorhexidine Gluconate Cloth 2 % PADS 6 each  6 each Topical Daily Vida Rigger, MD   6 each at 05/16/20 1444  . collagenase (SANTYL) ointment   Topical Daily Leandro Reasoner Tublu, MD      . dextrose 5 % solution   Intravenous Continuous Pieter Partridge, MD 87 mL/hr at 05/17/20 1504 New Bag at 05/17/20 1504  . enoxaparin (LOVENOX) injection 40 mg  40 mg Subcutaneous Q24H Leandro Reasoner Tublu, MD   40 mg at 05/16/20 2202  . [START ON 05/19/2020] levothyroxine (SYNTHROID, LEVOTHROID) injection 50 mcg  50 mcg Intravenous Q0600 [place], Alvira Philips, DO      . MEDLINE mouth rinse  15 mL Mouth Rinse q12n4p Vida Rigger, MD   15 mL at 05/17/20 1205  . midodrine (PROAMATINE) tablet 10 mg  10 mg Oral TID WC Vida Rigger, MD   10 mg at 05/17/20 1203  . morphine 2 MG/ML injection 1 mg  1 mg Intravenous Q3H PRN [place], Eric J, DO   1 mg at 05/17/20 1249  . norepinephrine (LEVOPHED) 4mg  in premix infusion  2-10 mcg/min Intravenous Titrated , Eric J, DO 30 mL/hr at 05/17/20 1503 8 mcg/min at 05/17/20 1503  . polyethylene glycol (MIRALAX / GLYCOLAX) packet 17 g  17 g Oral Daily Shanlever, 05/19/20, RPH      . sodium chloride flush (NS) 0.9 % injection 10-40 mL  10-40 mL Intracatheter Q12H 04-14-1997 Tublu, MD   10 mL at 05/16/20 0906  . sodium chloride flush (NS) 0.9 % injection 10-40 mL  10-40 mL Intracatheter PRN 04-14-1997 Tublu, MD      . vancomycin (VANCOREADY) IVPB 750 mg/150 mL  750 mg Intravenous Q12H Leandro Reasoner, [place], DO         Discharge Medications: Please see discharge summary for a list of discharge medications.  Relevant Imaging Results:  Relevant Lab Results:   Additional Information SS#: Alvira Philips  [id number], LCSW

## 2020-05-17 NOTE — Consult Note (Addendum)
SURGICAL CONSULTATION NOTE   HISTORY OF PRESENT ILLNESS (HPI):  49 y.o. female presented to [facility] ED for evaluation of fever and shortness of breath. Patient patient unable to give history.  History taken by ICU nurse and ICU physician.  Patient admitted with sepsis.  Patient with positive blood cultures.  Patient also with CT scan showing stage IV sacral ulcer with osteomyelitis.  She has been treated with aggressive IV antibiotic therapy in stepdown unit.  Patient unable to describe pain.  She does complain of pain in her back.  Unknown pain radiation.  Unknown alleviating or aggravating factors.  Patient has history of traumatic brain injury living in skilled nursing facility.  Surgery is consulted by Dr. Shon Hale in this context for evaluation and management of stage IV sacral ulcer.  PAST MEDICAL HISTORY (PMH):  Past Medical History:  Diagnosis Date  . Acute kidney injury (AKI) with acute tubular necrosis (ATN) (HCC)   . Acute on chronic combined systolic and diastolic CHF (congestive heart failure) (HCC) 06/06/2018  . Acute on chronic respiratory failure with hypoxia (HCC) 06/06/2018  . ARDS (adult respiratory distress syndrome) (HCC) 06/06/2018  . Aspiration pneumonia due to gastric secretions (HCC)   . Diabetes mellitus (HCC)   . History of traumatic brain injury   . Hypothyroidism   . Pneumonia due to Klebsiella pneumoniae (HCC) 06/06/2018  . Seizure disorder (HCC)   . Severe sepsis (HCC) 06/06/2018  . 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 Traumatic brain injury (HCC)      PAST SURGICAL HISTORY (PSH):  Past Surgical History:  Procedure Laterality Date  . TRACHEOSTOMY       MEDICATIONS:  Prior to Admission medications   Medication Sig Start Date End Date Taking? Authorizing Provider  acetaminophen (TYLENOL) 325 MG tablet Take 2 tablets (650 mg total) by mouth every 6 (six) hours as needed for mild pain or fever (or Fever >/= 101). 11/20/19  Yes Charise Killian, MD  aspirin  81 MG EC tablet aspirin 81 mg tablet,delayed release  Take 1 tablet every day by oral route.   Yes [provider]  B Complex-C-E-Zn (B COMPLEX-C-E-ZINC) tablet Take by mouth.   Yes [provider]  carbamazepine (TEGRETOL XR) 100 MG 12 hr tablet Take 100 mg by mouth 2 (two) times daily.   Yes [provider]  cetirizine (ZYRTEC) 10 MG tablet Take by mouth.   Yes [provider]  cyanocobalamin 50 MCG tablet Take by mouth.   Yes [provider]  famotidine (PEPCID) 20 MG tablet Take 20 mg by mouth 2 (two) times daily.   Yes [provider]  ferrous sulfate 325 (65 FE) MG tablet Take 325 mg by mouth daily.   Yes [provider]  folic acid (FOLVITE) 1 MG tablet Take 1 mg by mouth daily. 07/28/19  Yes [provider]  levothyroxine (SYNTHROID) 100 MCG tablet Take 100 mcg by mouth daily. 07/18/19  Yes [provider]  loratadine (CLARITIN) 10 MG tablet Take 20 mg by mouth daily.   Yes [provider]  midodrine (PROAMATINE) 5 MG tablet Take 5 mg by mouth 3 (three) times daily with meals.  05/06/20  Yes [provider]  MULTIPLE VITAMIN PO Take 1 tablet by mouth daily.   Yes [provider]  Neomy-Bacit-Polymyx-Pramoxine 1 % OINT Apply topically. Apply to (L) Lower buttock/At fold typically every evening shift for wound. Cover with gauze/conversite; Change QD 'til healed.   Yes [provider]  oxyCODONE (  OXY IR/ROXICODONE) 5 MG immediate release tablet Take 1 tablet (5 mg total) by mouth every 4 (four) hours as needed for moderate pain. 03/16/20  Yes Sreenath, Sudheer B, MD  polyethylene glycol powder (GLYCOLAX/MIRALAX) 17 GM/SCOOP powder Take 17 g by mouth daily.   Yes [provider]  Polyvinyl Alcohol-Povidone PF 1.4-0.6 % SOLN Place 1 drop into both eyes 3 (three) times daily.   Yes [provider]  SANTYL ointment Apply topically. 05/12/20  Yes [provider]   tamsulosin (FLOMAX) 0.4 MG CAPS capsule Take 0.4 mg by mouth at bedtime.  06/16/19  Yes [provider]  traMADol (ULTRAM) 50 MG tablet Take 25 mg by mouth 3 (three) times daily. 08/05/19  Yes [provider]     ALLERGIES:  Allergies  Allergen Reactions  . Kaopectate  [Attapulgite] Itching  . Thiopental Itching  . Tomato      SOCIAL HISTORY:  Social History   Socioeconomic History  . Marital status: Single    Spouse name: Not on file  . Number of children: Not on file  . Years of education: Not on file  . Highest education level: Not on file  Occupational History  . Not on file  Tobacco Use  . Smoking status: Never Smoker  . Smokeless tobacco: Never Used  Substance and Sexual Activity  . Alcohol use: Not Currently  . Drug use: Not Currently  . Sexual activity: Not Currently  Other Topics Concern  . Not on file  Social History Narrative  . Not on file   Social Determinants of Health   Financial Resource Strain:   . Difficulty of Paying Living Expenses:   Food Insecurity:   . Worried About Programme researcher, broadcasting/film/video in the Last Year:   . Barista in the Last Year:   Transportation Needs:   . Freight forwarder (Medical):   Marland Kitchen Lack of Transportation (Non-Medical):   Physical Activity:   . Days of Exercise per Week:   . Minutes of Exercise per Session:   Stress:   . Feeling of Stress :   Social Connections:   . Frequency of Communication with Friends and Family:   . Frequency of Social Gatherings with Friends and Family:   . Attends Religious Services:   . Active Member of Clubs or Organizations:   . Attends Banker Meetings:   Marland Kitchen Marital Status:   Intimate Partner Violence:   . Fear of Current or Ex-Partner:   . Emotionally Abused:   Marland Kitchen Physically Abused:   . Sexually Abused:       FAMILY HISTORY:  Family History  Family history unknown: Yes     REVIEW OF SYSTEMS:  Unable to get complete review of system due to  patient nonverbal due to tracheostomy.  VITAL SIGNS:  Temp:  [95.4 F (35.2 C)-98.8 F (37.1 C)] 98.8 F (37.1 C) (07/26 1600) Pulse Rate:  [67-97] 94 (07/26 1615) Resp:  [9-47] 19 (07/26 1730) BP: (75-133)/(36-66) 86/59 (07/26 1730) SpO2:  [90 %-99 %] 94 % (07/26 1615) FiO2 (%):  [28 %] 28 % (07/26 0829)     Height: 5\' 4"  (162.6 cm) Weight: 61.3 kg BMI (Calculated): 23.19   INTAKE/OUTPUT:  This shift: Total I/O In: 3781.5 [I.V.:1570.7; IV Piggyback:2210.8] Out: 1100 [Urine:1100]  Last 2 shifts: @IOLAST2SHIFTS @   PHYSICAL EXAM:  Constitutional:  -- Normal body habitus  -- Awake, alert, and cooperative Eyes:  -- Pupils equally round and reactive to light  --  No scleral icterus  Ear, nose, and throat:  -- No jugular venous distension  --Tracheostomy in place Pulmonary:  -- No crackles  -- Equal breath sounds bilaterally, diminished at the base -- Breathing non-labored at rest Cardiovascular:  -- S1, S2 present  -- No pericardial rubs Gastrointestinal:  -- Abdomen soft, nontender, non-distended, no guarding or rebound tenderness -- No abdominal masses appreciated, pulsatile or otherwise  Musculoskeletal and Integumentary:  -- Wounds: Stage IV sacral ulcer with small amount of necrotic tissue on the left aspect.  No purulence or necrotic tissue injuries of the wound bed. -- Extremities: B/L UE and LE FROM, hands and feet warm, no edema       Labs:  CBC Latest Ref Rng & Units 05/17/2020 05/16/2020 05/15/2020  WBC 4.0 - 10.5 K/uL 21.9(H) 22.8(H) 22.1(H)  Hemoglobin 12.0 - 15.0 g/dL 4.4(R) 1.5(Q) 00.8  Hematocrit 36 - 46 % 28.2(L) 30.3(L) 42.0  Platelets 150 - 400 K/uL 163 166 273   CMP Latest Ref Rng & Units 05/17/2020 05/17/2020 05/17/2020  Glucose 70 - 99 mg/dL 96 676(P) 950(D)  BUN 6 - 20 mg/dL 32(I) 71(I) 45(Y)  Creatinine 0.44 - 1.00 mg/dL 0.99 8.33 8.25  Sodium 135 - 145 mmol/L 150(H) 153(H) 156(H)  Potassium 3.5 - 5.1 mmol/L 3.9 3.7 3.8  Chloride 98 - 111 mmol/L  127(H) 129(H) >130(HH)  CO2 22 - 32 mmol/L 18(L) 17(L) 19(L)  Calcium 8.9 - 10.3 mg/dL 6.9(L) 6.9(L) 7.2(L)  Total Protein 6.5 - 8.1 g/dL - - -  Total Bilirubin 0.3 - 1.2 mg/dL - - -  Alkaline Phos 38 - 126 U/L - - -  AST 15 - 41 U/L - - -  ALT 0 - 44 U/L - - -     Imaging studies:  I personally evaluated the CT scan of the abdomen and pelvis.  I identified the sacral ulcer with the plan changes of the sacrum.  No fluid collection.  Ventral hernia noted without any complication.  Assessment/Plan:  49 y.o. female with stage IV sacral ulcer, complicated by pertinent comorbidities including osteomyelitis, traumatic brain injury, chronic tracheostomy, sepsis.  Patient with a stage IV sacral ulcer, chronic.  The moment of my evaluation the patient had a small amount of necrotic tissue on the left aspect of the wound.  I personally debrided the left side of the wound to the tendons and bone.The whole sacral ulcer measures 8 cm x 6 cm x 3 cm (pre debridement measurement).  The area debrided measured 3 cm x 6 cm. Post debridement measurement is 9 cm x 6 cm x 3 cm. The tissue debrided with scissors was necrotic and devitalized tissue.   The rest of the wound without any fluid collection or necrotic tissue to debride.  I again fluid dressing with Santyl collagenase.  At this moment I think the patient is a good candidate for any further debridement in the operating room and I also think that she does not need it at this moment.  I think that with the basic debridement and using Santyl collagenase and avoiding applying pressure on the sacral area this wound she will slowly start healing without any further complication.  Continues on the collagenase dressing changes as recommended by wound care nurse.  No further surgical management at this moment.  Reconsult as necessary.  Gae Gallop, MD

## 2020-05-17 NOTE — Progress Notes (Signed)
PICC order received.  Spoke with bedside nurse regarding placement and made aware that PICC will not be able to be placed tonight.  Recommended consult for IV team to place PIV or MD for CL.  Per bedside nurse MD is unable to place IJ central line due to contracture at neck level but patient arms are able to be extended.

## 2020-05-17 NOTE — Consult Note (Signed)
Consultation Note Date: 05/17/2020   Patient Name: Joanne [name]  DOB: 08/19/1971  MRN: [id number]  Age / Sex: 49 y.o., female  PCP: Patient, No Pcp Per Referring Physician: [place], Eric J, DO  Reason for Consultation: Establishing goals of care  HPI/Patient Profile: 49 y.o. female  with past medical history of traumatic brain injury requiring chronic trach, DM, CHF, and seizures admitted on 05/15/2020 with sepsis secondary to sacral decubitus ulcer stage IV.  05/15/20 - CT pelvis revealed significantly progressing large sacral decubitus ulcer and extension to the underlying sacrum and coccyx with evidence of osteomyelitis and bone destruction.  Patient was seen by PMT during previous hospitalization in May 2021.  Patient and family face treatment option decisions, advanced directive decisions, and anticipatory care needs.  Clinical Assessment and Goals of Care: I have reviewed medical records including EPIC notes, labs and imaging. Received report from primary RN. RN had no acute concerns.  Went to visit patient at bedside - no family present. Patient was lying in bed sleeping but did wake up to voice and gentle touch. She was very lethargic and kept falling asleep during conversation - she was not able to participate in full GOC conversation today.  Called [name]/HCPOA to discuss diagnosis, prognosis, GOC, EOL wishes, disposition, and options.  I introduced Palliative Medicine as specialized medical care for people living with serious illness. It focuses on providing relief from the symptoms and stress of a serious illness. The goal is to improve quality of life for both the patient and the family.  The patient is from [facility]. [name] stated that she lives in [place] and driving to [place] is a burden on her. She asked if it was possible to get the patient a bed at a facility  closer to [facility].   We discussed patient's current illness and what it means in the larger context of patient's on-going co-morbidities. Updated [name] on the patient's hospital course thus far including sepsis, hypotension requiring vasopressors, concern for possible sacral decub osteomyelitis and bone destruction - answered all questions and she expressed understanding. Natural disease trajectory and expectations at EOL were discussed.   I attempted to elicit values and goals of care important to the patient. The difference between aggressive medical intervention and comfort care was considered in light of the patient's goals of care. I introduced the concept of a comfort path to [place] and encouraged her to think about at what point she would want to stop aggressive medical interventions and focus on helping the patient feel as best she can for as long as he can outside the hospital, if that were the patient's goal.  Medical recommendation was given for DNR/DNI in light of the patient's current medical condition with education provided - [name] wanted to continue full code status at this time. [name] stated that in the past, the patient told her she "wants to live" and that is what [name] is using for her current decision making. Encouraged [name] to continue this conversation with the patient -  [name] recognized that the patient's goals may have changed.   Discussed with patient/family the importance of continued conversation with each other and the medical providers regarding overall plan of care and treatment options, ensuring decisions are within the context of the patient's values and GOCs.    [name] was appreciative of phone call and information this afternoon.  Questions and concerns were addressed. The family was encouraged to call with questions or concerns.    HCPOA [name] - aunt  SUMMARY OF RECOMMENDATIONS   -Continue current medical  treatment -Continue full code status -Family's goal is for patient to continue treatment and, depending on hospital course, discharge back to SNF.  -Watchful waiting -[name] wants to see if the patient can be moved from current SNF/[facility] to a facility closer to her home in [place]; [facility] consult placed -Asked [name] to bring copies of HCPOA and Legal Guardianship paperwork to update EMR. -Recommend outpatient Palliative Care to follow if they are not already involved; TOC consult placed -PMT will continue to follow holistically   Code Status/Advance Care Planning:  Full code  Palliative Prophylaxis:   Aspiration, Bowel Regimen, Delirium Protocol, Frequent Pain Assessment, Oral Care, Palliative Wound Care and Turn Reposition  Additional Recommendations (Limitations, Scope, Preferences):  Full Scope Treatment   Psycho-social/Spiritual:   Desire for further Chaplaincy support:no  Created space and opportunity for patient and family to express thoughts and feelings regarding patient's current medical situation.   Emotional support provided.  Prognosis:   Unable to determine  Discharge Planning: SNF with outpatient Palliative Care     Primary Diagnoses: Present on Admission: . Severe sepsis with septic shock (HCC) . Acute on chronic combined systolic and diastolic CHF (congestive heart failure) (HCC) . AKI (acute kidney injury) (HCC) . Hypothyroidism . Traumatic brain injury (HCC) . Hypernatremia . Hypokalemia   I have reviewed the medical record, interviewed the patient and family, and examined the patient. The following aspects are pertinent.  Past Medical History:  Diagnosis Date  . Acute kidney injury (AKI) with acute tubular necrosis (ATN) (HCC)   . Acute on chronic combined systolic and diastolic CHF (congestive heart failure) (HCC) 06/06/2018  . Acute on chronic respiratory failure with hypoxia (HCC) 06/06/2018  . ARDS (adult respiratory  distress syndrome) (HCC) 06/06/2018  . Aspiration pneumonia due to gastric secretions (HCC)   . Diabetes mellitus (HCC)   . History of traumatic brain injury   . Hypothyroidism   . Pneumonia due to Klebsiella pneumoniae (HCC) 06/06/2018  . Seizure disorder (HCC)   . Severe sepsis (HCC) 06/06/2018  . 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 Traumatic brain injury [facility])    Social History   Socioeconomic History  . Marital status: Single    Spouse name: Not on file  . Number of children: Not on file  . Years of education: Not on file  . Highest education level: Not on file  Occupational History  . Not on file  Tobacco Use  . Smoking status: Never Smoker  . Smokeless tobacco: Never Used  Substance and Sexual Activity  . Alcohol use: Not Currently  . Drug use: Not Currently  . Sexual activity: Not Currently  Other Topics Concern  . Not on file  Social History Narrative  . Not on file   Social Determinants of Health   Financial Resource Strain:   . Difficulty of Paying Living Expenses:   Food Insecurity:   . Worried About Programme researcher, broadcasting/film/video in the Last Year:   .  Ran Out of Food in the Last Year:   Transportation Needs:   . Freight forwarder (Medical):   Marland Kitchen Lack of Transportation (Non-Medical):   Physical Activity:   . Days of Exercise per Week:   . Minutes of Exercise per Session:   Stress:   . Feeling of Stress :   Social Connections:   . Frequency of Communication with Friends and Family:   . Frequency of Social Gatherings with Friends and Family:   . Attends Religious Services:   . Active Member of Clubs or Organizations:   . Attends Banker Meetings:   Marland Kitchen Marital Status:    Family History  Family history unknown: Yes   Scheduled Meds: . carbamazepine  100 mg Oral BID  . chlorhexidine  15 mL Mouth Rinse BID  . Chlorhexidine Gluconate Cloth  6 each Topical Daily  . collagenase   Topical Daily  . enoxaparin (LOVENOX) injection  40 mg Subcutaneous  Q24H  . [START ON 05/19/2020] levothyroxine  50 mcg Intravenous Q0600  . mouth rinse  15 mL Mouth Rinse q12n4p  . midodrine  10 mg Oral TID WC  . polyethylene glycol  17 g Oral Daily  . sodium chloride flush  10-40 mL Intracatheter Q12H   Continuous Infusions: . sodium chloride 1,000 mL (05/16/20 0826)  . ceFEPime (MAXIPIME) IV Stopped (05/17/20 0943)  . dextrose 87 mL/hr at 05/17/20 1257  . norepinephrine (LEVOPHED) Adult infusion 8 mcg/min (05/17/20 1257)  . vancomycin Stopped (05/17/20 1109)   PRN Meds:.acetaminophen, bisacodyl, morphine injection, sodium chloride flush Medications Prior to Admission:  Prior to Admission medications   Medication Sig Start Date End Date Taking? Authorizing Provider  acetaminophen (TYLENOL) 325 MG tablet Take 2 tablets (650 mg total) by mouth every 6 (six) hours as needed for mild pain or fever (or Fever >/= 101). 11/20/19  Yes Charise Killian, MD  aspirin 81 MG EC tablet aspirin 81 mg tablet,delayed release  Take 1 tablet every day by oral route.   Yes [provider]  B Complex-C-E-Zn (B COMPLEX-C-E-ZINC) tablet Take by mouth.   Yes [provider]  carbamazepine (TEGRETOL XR) 100 MG 12 hr tablet Take 100 mg by mouth 2 (two) times daily.   Yes [provider]  cetirizine (ZYRTEC) 10 MG tablet Take by mouth.   Yes [provider]  cyanocobalamin 50 MCG tablet Take by mouth.   Yes [provider]  famotidine (PEPCID) 20 MG tablet Take 20 mg by mouth 2 (two) times daily.   Yes [provider]  ferrous sulfate 325 (65 FE) MG tablet Take 325 mg by mouth daily.   Yes [provider]  folic acid (FOLVITE) 1 MG tablet Take 1 mg by mouth daily. 07/28/19  Yes [provider]  levothyroxine (SYNTHROID) 100 MCG tablet Take 100 mcg by mouth daily. 07/18/19  Yes [provider]  loratadine (CLARITIN) 10 MG tablet Take 20 mg by mouth daily.   Yes [provider]  midodrine  (PROAMATINE) 5 MG tablet Take 5 mg by mouth 3 (three) times daily with meals.  05/06/20  Yes [provider]  MULTIPLE VITAMIN PO Take 1 tablet by mouth daily.   Yes [provider]  Neomy-Bacit-Polymyx-Pramoxine 1 % OINT Apply topically. Apply to (L) Lower buttock/At fold typically every evening shift for wound. Cover with gauze/conversite; Change QD 'til healed.   Yes [provider]  oxyCODONE (OXY IR/ROXICODONE) 5 MG immediate release tablet Take 1 tablet (  5 mg total) by mouth every 4 (four) hours as needed for moderate pain. 03/16/20  Yes Sreenath, Sudheer B, MD  polyethylene glycol powder (GLYCOLAX/MIRALAX) 17 GM/SCOOP powder Take 17 g by mouth daily.   Yes [provider]  Polyvinyl Alcohol-Povidone PF 1.4-0.6 % SOLN Place 1 drop into both eyes 3 (three) times daily.   Yes [provider]  SANTYL ointment Apply topically. 05/12/20  Yes [provider]  tamsulosin (FLOMAX) 0.4 MG CAPS capsule Take 0.4 mg by mouth at bedtime.  06/16/19  Yes [provider]  traMADol (ULTRAM) 50 MG tablet Take 25 mg by mouth 3 (three) times daily. 08/05/19  Yes [provider]   Allergies  Allergen Reactions  . Kaopectate  [Attapulgite] Itching  . Thiopental Itching  . Tomato    Review of Systems  Unable to perform ROS: Acuity of condition    Physical Exam Constitutional:      General: She is not in acute distress.    Appearance: She is ill-appearing.  Pulmonary:     Effort: No respiratory distress.  Skin:    General: Skin is warm and dry.  Neurological:     Mental Status: She is lethargic.     Motor: Weakness present.     Vital Signs: BP (!) 79/47   Pulse 94   Temp (!) 97.2 F (36.2 C) (Rectal)   Resp (!) 32   Ht 5\' 4"  (1.626 m)   Wt 61.3 kg   LMP  (LMP Unknown)   SpO2 95%   BMI 23.20 kg/m  Pain Scale: FLACC   Pain Score: Asleep   SpO2: SpO2: 95 % O2 Device:SpO2: 95 % O2 Flow Rate: .O2 Flow Rate (L/min): 6  L/min  IO: Intake/output summary:   Intake/Output Summary (Last 24 hours) at 05/17/2020 1331 Last data filed at 05/17/2020 1257 Gross per 24 hour  Intake 8896.93 ml  Output 1800 ml  Net 7096.93 ml    LBM: Last BM Date: 05/16/20 Baseline Weight: Weight: 63.5 kg Most recent weight: Weight: 61.3 kg     Palliative Assessment/Data: PPS 20-30%     Time In: 1300 Time Out: 1410 Time Total: 70 minutes  Greater than 50%  of this time was spent counseling and coordinating care related to the above assessment and plan.  Signed by: Haskel KhanAmber M Merrick Maggio, NP   Please contact Palliative Medicine Team phone at [phone] for questions and concerns.  For individual provider: See Loretha StaplerAmion

## 2020-05-17 NOTE — Progress Notes (Signed)
CH attempted visit per prior visits on pt.'s past admission; pt. asleep.  CH remains available as needed and will inform [facility] of pt.'s situation.

## 2020-05-17 NOTE — Evaluation (Signed)
Clinical/Bedside Swallow Evaluation Patient Details  Name: Joanne [name] MRN: [id number] Date of Birth: 12/07/1970  Today's Date: 05/17/2020 Time: SLP Start Time (ACUTE ONLY): 1130 SLP Stop Time (ACUTE ONLY): 1245 SLP Time Calculation (min) (ACUTE ONLY): 75 min  Past Medical History:  Past Medical History:  Diagnosis Date  . Acute kidney injury (AKI) with acute tubular necrosis (ATN) (HCC)   . Acute on chronic combined systolic and diastolic CHF (congestive heart failure) (HCC) 06/06/2018  . Acute on chronic respiratory failure with hypoxia (HCC) 06/06/2018  . ARDS (adult respiratory distress syndrome) (HCC) 06/06/2018  . Aspiration pneumonia due to gastric secretions (HCC)   . Diabetes mellitus (HCC)   . History of traumatic brain injury   . Hypothyroidism   . Pneumonia due to Klebsiella pneumoniae (HCC) 06/06/2018  . Seizure disorder (HCC)   . Severe sepsis (HCC) 06/06/2018  . 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 Traumatic brain injury [facility])    Past Surgical History:  Past Surgical History:  Procedure Laterality Date  . TRACHEOSTOMY     HPI:  Per admitting H and P: "Joanne [name] is an 49 y.o. female with PMH significant for TBI requiring chronic trach who lives in a SNF who is now brought to the ED for fever and difficulty obtaining O2 saturations.  Of note patient was recently discharged after treatment for septic shock secondary to UTI and possible pneumonia is now readmitted for recurrent sepsis.  This course was complicated by acute kidney injury, hypernatremia and ARDS.   Assessment / Plan / Recommendation Clinical Impression  This pleasant 49 YO patient is familiar to ST from last admission. She has a chronic trach and tolerates a passy muir speaking valve. On last admission she was tolerating a Dysphagia 1 diet with nectar thick liquids. Today, Pt was somewhat lethargic and fatigued easily during the bedside swallow eval. Oral care provided and PMV placed prior to swallow  eval. Pt tolerated a few bites of applesauce and sips of honey thick liquid without immediate s/s of aspiration. Pt needed min cues to swallow the applesauce at the beginning of the swallow eval but needed max cues for the last few swallow as she fatigued. Weak cough noted prior to any Po's and again at the end of the session. Pt took the sips of honey thick liquid with hand over hand assist. At this time, rec NPo except meds crushed in applesauce. Once Pt has swallowed the meds, Nsg may follow with 2 sips of honey thick liquids. ST assisted with meds upon completion of exam today. Sign posted above bed stating the above recommendations. Nsg should place PMV prior to meds and remove PMV while Pt is resting or sleeping. Oral care at least twice daily. St to reassess during treatment tomorrow in hopes of starting a diet. SLP Visit Diagnosis: Dysphagia, oropharyngeal phase (R13.12)    Aspiration Risk  Moderate aspiration risk    Diet Recommendation NPO except meds;Other (Comment) (crushed in applesauce. May give 2 sips of honey thick)   Medication Administration: Crushed with puree Compensations: Minimize environmental distractions;Slow rate;Small sips/bites Postural Changes: Seated upright at 90 degrees;Remain upright for at least 30 minutes after po intake    Other  Recommendations Oral Care Recommendations: Oral care BID Other Recommendations: Place PMSV during PO intake   Follow up Recommendations Skilled Nursing facility      Frequency and Duration min 3x week  1 week       Prognosis Prognosis for Safe Diet Advancement: Fair  Barriers to Reach Goals: Severity of deficits      Swallow Study   General Date of Onset: 05/16/20 HPI: Per admitting H and P: "Joanne [name] is an 49 y.o. female with PMH significant for TBI requiring chronic trach who lives in a SNF who is now brought to the ED for fever and difficulty obtaining O2 saturations.  Of note patient was recently discharged after  treatment for septic shock secondary to UTI and possible pneumonia is now readmitted for recurrent sepsis.  This course was complicated by acute kidney injury, hypernatremia and ARDS. Type of Study: Bedside Swallow Evaluation Previous Swallow Assessment: History of dysphagia, seen by ST on last admission Diet Prior to this Study: NPO Temperature Spikes Noted: No Respiratory Status: Trach Collar History of Recent Intubation: No Behavior/Cognition: Cooperative;Pleasant mood;Lethargic/Drowsy Oral Cavity Assessment: Dried secretions Oral Care Completed by SLP: Yes Oral Cavity - Dentition: Poor condition Self-Feeding Abilities: Needs assist;Total assist Patient Positioning: Upright in bed Baseline Vocal Quality: Breathy;Hoarse;Low vocal intensity;Other (comment) (Improved some with PMV) Volitional Cough: Weak Volitional Swallow: Unable to elicit    Oral/Motor/Sensory Function Overall Oral Motor/Sensory Function: Within functional limits   Ice Chips Ice chips: Not tested   Thin Liquid Thin Liquid: Not tested    Nectar Thick Nectar Thick Liquid: Not tested   Honey Thick Presentation: Spoon;Cup;Self fed   Puree Presentation: Spoon   Solid     Solid: Not tested      Eather Colas 05/17/2020,1:14 PM

## 2020-05-18 ENCOUNTER — Inpatient Hospital Stay (HOSPITAL_COMMUNITY)
Admit: 2020-05-18 | Discharge: 2020-05-18 | Disposition: A | Discharge status: Home / Self Care | Payer: Medicare PPO | Attending: Internal Medicine | Admitting: Internal Medicine

## 2020-05-18 DIAGNOSIS — E860 Dehydration: Secondary | ICD-10-CM

## 2020-05-18 DIAGNOSIS — R7881 Bacteremia: Secondary | ICD-10-CM

## 2020-05-18 DIAGNOSIS — Z87820 Personal history of traumatic brain injury: Secondary | ICD-10-CM

## 2020-05-18 DIAGNOSIS — I110 Hypertensive heart disease with heart failure: Secondary | ICD-10-CM

## 2020-05-18 DIAGNOSIS — E1159 Type 2 diabetes mellitus with other circulatory complications: Secondary | ICD-10-CM

## 2020-05-18 DIAGNOSIS — A4159 Other Gram-negative sepsis: Secondary | ICD-10-CM

## 2020-05-18 DIAGNOSIS — Z8619 Personal history of other infectious and parasitic diseases: Secondary | ICD-10-CM

## 2020-05-18 LAB — BLOOD CULTURE ID PANEL (REFLEXED)

## 2020-05-18 LAB — COMPREHENSIVE METABOLIC PANEL
ALT: 21 U/L (ref 0–44)
AST: 22 U/L (ref 15–41)
Albumin: 1.1 g/dL — ABNORMAL LOW (ref 3.5–5.0)
Alkaline Phosphatase: 104 U/L (ref 38–126)
Anion gap: 7 (ref 5–15)
BUN: 16 mg/dL (ref 6–20)
CO2: 17 mmol/L — ABNORMAL LOW (ref 22–32)
Calcium: 7.4 mg/dL — ABNORMAL LOW (ref 8.9–10.3)
Chloride: 126 mmol/L — ABNORMAL HIGH (ref 98–111)
Creatinine, Ser: 0.89 mg/dL (ref 0.44–1.00)
GFR calc Af Amer: >60 mL/min (ref >60)
GFR calc non Af Amer: >60 mL/min (ref >60)
Glucose, Bld: 112 mg/dL — ABNORMAL HIGH (ref 70–99)
Potassium: 3.1 mmol/L — ABNORMAL LOW (ref 3.5–5.1)
Sodium: 150 mmol/L — ABNORMAL HIGH (ref 135–145)
Total Bilirubin: 0.8 mg/dL (ref 0.3–1.2)
Total Protein: 5.3 g/dL — ABNORMAL LOW (ref 6.5–8.1)

## 2020-05-18 LAB — BASIC METABOLIC PANEL
Anion gap: 8 (ref 5–15)
BUN: 18 mg/dL (ref 6–20)
CO2: 19 mmol/L — ABNORMAL LOW (ref 22–32)
Calcium: 7.5 mg/dL — ABNORMAL LOW (ref 8.9–10.3)
Chloride: 124 mmol/L — ABNORMAL HIGH (ref 98–111)
Creatinine, Ser: 0.85 mg/dL (ref 0.44–1.00)
GFR calc Af Amer: >60 mL/min (ref >60)
GFR calc non Af Amer: >60 mL/min (ref >60)
Glucose, Bld: 88 mg/dL (ref 70–99)
Potassium: 3.9 mmol/L (ref 3.5–5.1)
Sodium: 151 mmol/L — ABNORMAL HIGH (ref 135–145)

## 2020-05-18 LAB — CBC
HCT: 28.6 % — ABNORMAL LOW (ref 36.0–46.0)
Hemoglobin: 9.3 g/dL — ABNORMAL LOW (ref 12.0–15.0)
MCH: 28.4 pg (ref 26.0–34.0)
MCHC: 32.5 g/dL (ref 30.0–36.0)
MCV: 87.5 fL (ref 80.0–100.0)
Platelets: 155 10*3/uL (ref 150–400)
RBC: 3.27 MIL/uL — ABNORMAL LOW (ref 3.87–5.11)
RDW: 18.7 % — ABNORMAL HIGH (ref 11.5–15.5)
WBC: 21.2 10*3/uL — ABNORMAL HIGH (ref 4.0–10.5)
nRBC: 0.4 % — ABNORMAL HIGH (ref 0.0–0.2)

## 2020-05-18 LAB — CALCIUM, IONIZED: Calcium, Ionized, Serum: 4.9 mg/dL (ref 4.5–5.6)

## 2020-05-18 LAB — MAGNESIUM: Magnesium: 1.5 mg/dL — ABNORMAL LOW (ref 1.7–2.4)

## 2020-05-18 LAB — PROCALCITONIN: Procalcitonin: 0.28 ng/mL

## 2020-05-18 LAB — LACTIC ACID, PLASMA: Lactic Acid, Venous: 3.7 mmol/L (ref 0.5–1.9)

## 2020-05-18 MED ORDER — HYDROCORTISONE NA SUCCINATE PF 100 MG IJ SOLR
50.0000 mg | Freq: Four times a day (QID) | INTRAMUSCULAR | Status: DC
  Administered 2020-05-18 – 2020-05-23 (×21): 50 mg via INTRAVENOUS
  Filled 2020-05-18: qty 1
  Filled 2020-05-18 (×4): qty 2
  Filled 2020-05-18: qty 1
  Filled 2020-05-18: qty 2
  Filled 2020-05-18: qty 1
  Filled 2020-05-18: qty 2
  Filled 2020-05-18: qty 1
  Filled 2020-05-18 (×13): qty 2

## 2020-05-18 MED ORDER — SODIUM CHLORIDE 0.9 % IV SOLN
1.0000 g | Freq: Three times a day (TID) | INTRAVENOUS | Status: DC
  Administered 2020-05-18 – 2020-05-19 (×3): 1 g via INTRAVENOUS
  Filled 2020-05-18 (×8): qty 1

## 2020-05-18 NOTE — Progress Notes (Addendum)
PROGRESS NOTE    Joanne [name]  ZOX:[id number] DOB: 15-Sep-1971 DOA: 05/15/2020 PCP: Patient, No Pcp Per    Brief Narrative:  Joanne [name] is an 49 y.o. female with PMH significant for TBI requiring chronic trach who lives in a SNF who is now brought to the ED for fever and difficulty obtaining O2 saturations.  Of note patient was recently discharged after treatment for septic shock secondary to UTI and possible pneumonia is now readmitted for recurrent sepsis.  This course was complicated by acute kidney injury, hypernatremia and ARDS.  Patient is unable to provide me a history.  History is entirely per ED documentation and chart review  In the emergency department, the patient was noted to be hypotensive, tachycardic and febrile.  She was given 1 L normal saline bolus and started on vancomycin and Zosyn.  She was also noted to be hyponatremic which seems to be somewhat of a chronic condition for her as she was also hyponatremic on her last admission in May.  Chest x-ray was done which was read as negative.   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  On presentation, temperature 101.1 with elevated WBC count of 22.1.  Lactic acid 3.9.  Urinalysis negative nitrite, small leukoesterase, rare bacteria 6-10 WBCs.  Chest x-ray with no acute cardiopulmonary disease process.  Patient with known large sacral decubitus ulcer stage IV and CT pelvis with contrast shows large sacral  acute ulcer with evidence of osteomyelitis of the sacrum and coccyx without signs of abscess.  Patient was aggressively fluid resuscitated with 4 L on admission; but unfortunately still remains hypotensive.  Received albumin 12.5g IV.  Urine culture with insignificant growth.  Underwent bedside debridement by general surgery, Dr. Hazle Quant on 7/26; no further surgical management at this time. --PCCM and palliative care following, appreciate assistance --WBC 22.1>22.8>21.2 --lactic acid 5.1>3.9>3.1>3.7 --Procalcitonin: 0.47>0.37>0.28 --Blood cultures x2: Positive for gram-positive cocci and GNR, BCID strep pyogenes --Continue norepinephrine, titrate to maintain MAP greater than 65, currently on 67mcg/min --Continue empiric antibiotics with vancomycin --cefepime changed to meropenem 7/27 --Stress dose steroids with hydrocortisone 50 mg IV q.6h --Repeat blood cultures today --Echocardiogram ordered --Infectious disease consulted for further recommendations --Supportive care --Ultimately extremely poor/grave prognosis which was conveyed to patient's guardian who continues to request of aggressive measures at this time  Hypernatremia Patient presenting with sodium of 166, likely secondary to sepsis as above coupled with dehydration and poor oral intake in the preceding days. --Na [id number] --Continue D5W at 75mL per hour --Continue to monitor sodium level every 6 hours, avoid over-correction, no more than 8-10 meq/24h  Severe hypokalemia Repleted.  Potassium 3.7 this morning --Monitor on telemetry  Acute renal failure on CKD stage II: Resolved Creatinine 1.56 on admission.  Etiology likely secondary to prerenal azotemia secondary to severe dehydration versus ATN from septic shock as above. --Cr 1.56>1.12>0.73>0.89 --Supportive care with IV fluids and antibiotics --Avoid nephrotoxins, renally dose all medications --Follow renal function closely daily  Hypothyroidism On  levothyroxine 100  mcg p.o. daily at home.  TSH 2.588, free T4 0.53. --Levothyroxine 50 mcg IV daily  Chronic pain syndrome --Hold pain medications secondary to hypotension --Tylenol PR as needed  Hx traumatic brain injury Patient presenting from SNF with history of TBI, trach dependent.  Ultimately extremely poor prognosis given her frailty, large sacral decubitus ulcer with extension to bone/osteomyelitis. --Supportive care --Palliative care consulted and patient's guardian continues to wish for aggressive measures at this time since this was "the patient's wishes".  DVT prophylaxis: Lovenox Code Status: Full code  Family Communication: Updated patient's guardian, [name] regarding patient's continued grave condition  Disposition Plan:  Status is: Inpatient  Remains inpatient appropriate because:Hemodynamically unstable, Persistent severe electrolyte disturbances, Altered mental status, Ongoing diagnostic testing needed not appropriate for outpatient work up, Unsafe d/c plan and IV treatments appropriate due to intensity of illness or inability to take PO   Dispo: The patient is from: SNF              Anticipated d/c is to: To be determined              Anticipated d/c date is: > 3 days              Patient currently is not medically stable to d/c.   Consultants:   PCCM  General surgery, Dr. Hazle Quant; signed off 05/17/2020  Infectious disease  Procedures:   Echocardiogram: Pending  Bedside debridement stage IV sacral decubitus ulcer, Dr. Hazle Quant 7/26  Antimicrobials:   Vancomycin 7/24>>  Cefepime 7/24 - 7/27  Meropenem 7/27>>   Subjective: Patient seen and examined at bedside.  Slightly more alert this morning.  Continues on empiric antibiotics and vasopressor support.  No family present at bedside. Unable to obtain any further ROS from patient given her mental status.  ID consulted, echocardiogram pending.  Extremely grim/poor prognosis.  No  acute concerns overnight per nursing staff.  Objective: Vitals:   05/18/20 0500 05/18/20 0600 05/18/20 0630 05/18/20 0831  BP: (!) 93/56 (!) 93/58 (!) 105/57   Pulse: 95 98  96  Resp: (!) 32 (!) 32 (!) 39 (!) 30  Temp:   98.8 F (37.1 C)   TempSrc:   Rectal   SpO2: 99% 95%  95%  Weight:      Height:        Intake/Output Summary (Last 24 hours) at 05/18/2020 1235 Last data filed at 05/18/2020 0630 Gross per 24 hour  Intake 5229.3 ml  Output 2425 ml  Net 2804.3 ml   Filed Weights   05/15/20 1320 05/16/20 1420  Weight: 63.5 kg 61.3 kg    Examination:  General exam: Altered, somnolent, responds to painful stimuli, severely cachectic in appearance Respiratory system: Coarse breath sounds bilaterally, diminished bilateral bases, on face tent, trach noted Cardiovascular system: S1 & S2 heard, RRR. No JVD, murmurs, rubs, gallops or clicks. No pedal edema. Gastrointestinal system: Abdomen is nondistended, soft and nontender. No organomegaly or masses felt. Normal bowel sounds heard. GU: Foley catheter noted Central nervous system: Somnolent, responds to painful stimuli Extremities: No peripheral edema Skin: Large sacral decubitus ulcer with extension to bone Psychiatry: Somnolent, altered       Data Reviewed: I have personally reviewed following labs and imaging studies  CBC: Recent Labs  Lab 05/15/20 1329 05/16/20 0510 05/17/20 0519 05/18/20 0657  WBC 22.1* 22.8* 21.9* 21.2*  NEUTROABS 19.5*  --   --   --   HGB 12.8 9.3* 9.2* 9.3*  HCT 42.0 30.3*  28.2* 28.6*  MCV 91.7 92.9 88.4 87.5  PLT 273 166 163 155   Basic Metabolic Panel: Recent Labs  Lab 05/16/20 0510 05/16/20 1716 05/17/20 0519 05/17/20 1233 05/17/20 1748 05/18/20 0046 05/18/20 0657  NA 158*   < > 153* 150* 151* 151* 150*  K 2.4*   < > 3.7 3.9 4.4 3.9 3.1*  CL 129*   < > 129* 127* 128* 124* 126*  CO2 21*   < > 17* 18* 15* 19* 17*  GLUCOSE 183*   < > 112* 96 110* 88 112*  BUN 37*   < > 25* 21*  20 18 16   CREATININE 1.12*   < > 0.73 0.89 0.82 0.85 0.89  CALCIUM 6.9*   < > 6.9* 6.9* 7.1* 7.5* 7.4*  MG 1.8  --   --   --   --   --  1.5*  PHOS 1.9*  --   --   --   --   --   --    < > = values in this interval not displayed.   GFR: Estimated Creatinine Clearance: 65.3 mL/min (by C-G formula based on SCr of 0.89 mg/dL). Liver Function Tests: Recent Labs  Lab 05/15/20 1329 05/16/20 0510 05/18/20 0657  AST 38 26 22  ALT 25 17 21   ALKPHOS 172* 115 104  BILITOT 0.8 0.7 0.8  PROT 8.6* 5.8* 5.3*  ALBUMIN 1.5* <1.0* 1.1*   No results for input(s): LIPASE, AMYLASE in the last 168 hours. No results for input(s): AMMONIA in the last 168 hours. Coagulation Profile: No results for input(s): INR, PROTIME in the last 168 hours. Cardiac Enzymes: No results for input(s): CKTOTAL, CKMB, CKMBINDEX, TROPONINI in the last 168 hours. BNP (last 3 results) No results for input(s): PROBNP in the last 8760 hours. HbA1C: No results for input(s): HGBA1C in the last 72 hours. CBG: Recent Labs  Lab 05/16/20 1422  GLUCAP 86   Lipid Profile: No results for input(s): CHOL, HDL, LDLCALC, TRIG, CHOLHDL, LDLDIRECT in the last 72 hours. Thyroid Function Tests: Recent Labs    05/16/20 0510  TSH 2.588  FREET4 0.53*   Anemia Panel: No results for input(s): VITAMINB12, FOLATE, FERRITIN, TIBC, IRON, RETICCTPCT in the last 72 hours. Sepsis Labs: Recent Labs  Lab 05/16/20 0510 05/16/20 1716 05/17/20 0519 05/17/20 0927 05/18/20 0047 05/18/20 0657  PROCALCITON 0.47  --  0.37  --   --  0.28  LATICACIDVEN 3.1* 2.8*  --  2.7* 3.7*  --     Recent Results (from the past 240 hour(s))  Blood Culture (routine x 2)     Status: None (Preliminary result)   Collection Time: 05/15/20  1:29 PM   Specimen: BLOOD  Result Value Ref Range Status   Specimen Description BLOOD R ARM  Final   Special Requests   Final    BOTTLES DRAWN AEROBIC AND ANAEROBIC Blood Culture results may not be optimal due to an  excessive volume of blood received in culture bottles   Culture   Final    NO GROWTH 3 DAYS Performed at [facility], [address] [place], [address] [place]    Report Status PENDING  Incomplete  Blood Culture (routine x 2)     Status: None (Preliminary result)   Collection Time: 05/15/20  1:29 PM   Specimen: BLOOD  Result Value Ref Range Status   Specimen Description   Final    BLOOD L AC Performed at [facility], [address] [place], [place] [address]    Special Requests   Final    BOTTLES DRAWN AEROBIC AND ANAEROBIC Blood Culture results may not be optimal due to an inadequate volume of blood received in culture bottles Performed at [facility], [address] [place], [place] [address]    Culture  Setup Time   Final    Organism ID to follow GRAM POSITIVE COCCI ANAEROBIC BOTTLE ONLY CRITICAL RESULT CALLED TO, READ BACK BY AND VERIFIED WITH: MYRA SLAUGHTER ON 05/16/2020 AT 1605 TIK GRAM NEGATIVE RODS AEROBIC BOTTLE ONLY CRITICAL RESULT CALLED TO, READ BACK BY AND VERIFIED WITH: DAVID BASANTI 05/18/20 0520 SJL/RWW Performed at [facility], [address] [place], [place] [address]    Culture GRAM POSITIVE COCCI GRAM NEGATIVE RODS   Final   Report Status PENDING  Incomplete  Blood Culture ID Panel (Reflexed)     Status: None   Collection Time: 05/15/20  1:29 PM  Result Value Ref Range Status   Enterococcus species NOT DETECTED NOT DETECTED Final   Listeria monocytogenes NOT DETECTED NOT DETECTED Final   Staphylococcus species NOT DETECTED NOT DETECTED Final   Staphylococcus aureus (BCID) NOT DETECTED NOT DETECTED Final   Streptococcus species NOT DETECTED NOT DETECTED Final   Streptococcus agalactiae NOT DETECTED NOT DETECTED Final   Streptococcus pneumoniae NOT DETECTED NOT DETECTED Final   Streptococcus pyogenes NOT DETECTED NOT DETECTED Final   Acinetobacter baumannii NOT DETECTED NOT DETECTED Final    Enterobacteriaceae species NOT DETECTED NOT DETECTED Final   Enterobacter cloacae complex NOT DETECTED NOT DETECTED Final   Escherichia coli NOT DETECTED NOT DETECTED Final   Klebsiella oxytoca NOT DETECTED NOT DETECTED Final   Klebsiella pneumoniae NOT DETECTED NOT DETECTED Final   Proteus species NOT DETECTED NOT DETECTED Final   Serratia marcescens NOT DETECTED NOT DETECTED Final   Haemophilus influenzae NOT DETECTED NOT DETECTED Final   Neisseria meningitidis NOT DETECTED NOT DETECTED Final   Pseudomonas aeruginosa NOT DETECTED NOT DETECTED Final   Candida albicans NOT DETECTED NOT DETECTED Final   Candida glabrata NOT DETECTED NOT DETECTED Final   Candida krusei NOT DETECTED NOT DETECTED Final   Candida parapsilosis NOT DETECTED NOT DETECTED Final   Candida tropicalis NOT DETECTED NOT DETECTED Final    Comment: Performed at [facility], [address] [place], [place] [address]  Blood Culture ID Panel (Reflexed)     Status: Abnormal   Collection Time: 05/15/20  1:29 PM  Result Value Ref Range Status   Enterococcus species NOT DETECTED NOT DETECTED Final   Listeria monocytogenes NOT DETECTED NOT DETECTED Final   Staphylococcus species NOT DETECTED NOT DETECTED Final   Staphylococcus aureus (BCID) NOT DETECTED NOT DETECTED Final   Streptococcus species NOT DETECTED NOT DETECTED Final   Streptococcus agalactiae NOT DETECTED NOT DETECTED Final   Streptococcus pneumoniae NOT DETECTED NOT DETECTED Final   Streptococcus pyogenes NOT DETECTED NOT DETECTED Final   Acinetobacter baumannii NOT DETECTED NOT DETECTED Final   Enterobacteriaceae species DETECTED (A) NOT DETECTED Final    Comment: Enterobacteriaceae represent a large family of gram-negative bacteria, not a single organism. CRITICAL RESULT CALLED TO, READ BACK BY AND VERIFIED WITH: DAVID BASANTI 05/18/20 0520 SJL    Enterobacter cloacae complex NOT DETECTED NOT DETECTED Final   Escherichia coli NOT DETECTED NOT  DETECTED Final   Klebsiella oxytoca NOT DETECTED NOT DETECTED Final   Klebsiella pneumoniae NOT DETECTED NOT DETECTED Final   Proteus species DETECTED (A) NOT DETECTED Final  Comment: CRITICAL RESULT CALLED TO, READ BACK BY AND VERIFIED WITH: DAVID BASANTI 05/18/20 0520 SJL    Serratia marcescens NOT DETECTED NOT DETECTED Final   Carbapenem resistance NOT DETECTED NOT DETECTED Final   Haemophilus influenzae NOT DETECTED NOT DETECTED Final   Neisseria meningitidis NOT DETECTED NOT DETECTED Final   Pseudomonas aeruginosa NOT DETECTED NOT DETECTED Final   Candida albicans NOT DETECTED NOT DETECTED Final   Candida glabrata NOT DETECTED NOT DETECTED Final   Candida krusei NOT DETECTED NOT DETECTED Final   Candida parapsilosis NOT DETECTED NOT DETECTED Final   Candida tropicalis NOT DETECTED NOT DETECTED Final    Comment: Performed at [facility], [address] [place], [place] [address]  Urine culture     Status: Abnormal   Collection Time: 05/15/20  2:03 PM   Specimen: In/Out Cath Urine  Result Value Ref Range Status   Specimen Description   Final    IN/OUT CATH URINE Performed at [facility], [address], [place], [place] [address]    Special Requests   Final    NONE Performed at [facility], [address], [place], [place] [address]    Culture (A)  Final    <10,000 COLONIES/mL INSIGNIFICANT GROWTH Performed at [facility], [address], [place], [place] [address]    Report Status 05/16/2020 FINAL  Final  SARS Coronavirus 2 by RT PCR (hospital order, performed in [facility] hospital lab) Nasopharyngeal Nasopharyngeal Swab     Status: None   Collection Time: 05/16/20 12:07 AM   Specimen: Nasopharyngeal Swab  Result Value Ref Range Status   SARS Coronavirus 2 NEGATIVE NEGATIVE Final    Comment: (NOTE) SARS-CoV-2 target nucleic acids are NOT DETECTED.  The SARS-CoV-2 RNA is generally detectable in upper and lower respiratory  specimens during the acute phase of infection. The lowest concentration of SARS-CoV-2 viral copies this assay can detect is 250 copies / mL. A negative result does not preclude SARS-CoV-2 infection and should not be used as the sole basis for treatment or other patient management decisions.  A negative result may occur with improper specimen collection / handling, submission of specimen other than nasopharyngeal swab, presence of viral mutation(s) within the areas targeted by this assay, and inadequate number of viral copies (<250 copies / mL). A negative result must be combined with clinical observations, patient history, and epidemiological information.  Fact Sheet for Patients:   BoilerBrush.com.cy  Fact Sheet for Healthcare Providers: https://pope.com/  This test is not yet approved or  cleared by the [place] FDA and has been authorized for detection and/or diagnosis of SARS-CoV-2 by FDA under an Emergency Use Authorization (EUA).  This EUA will remain in effect (meaning this test can be used) for the duration of the COVID-19 declaration under Section 564(b)(1) of the Act, 21 U.S.C. section 360bbb-3(b)(1), unless the authorization is terminated or revoked sooner.  Performed at [facility], [address] [place], [place] [address]   MRSA PCR Screening     Status: None   Collection Time: 05/16/20  2:37 PM   Specimen: Nasopharyngeal  Result Value Ref Range Status   MRSA by PCR NEGATIVE NEGATIVE Final    Comment:        The GeneXpert MRSA Assay (FDA approved for NASAL specimens only), is one component of a comprehensive MRSA colonization surveillance program. It is not intended to diagnose MRSA infection nor to guide or monitor treatment for MRSA infections. Performed at [facility], [address] [place], [place] [address]   CULTURE, BLOOD (ROUTINE X 2) w Reflex to ID Panel     Status: None  (Preliminary result)   Collection Time: 05/18/20 12:46 AM   Specimen: BLOOD  Result Value Ref Range Status   Specimen Description BLOOD RIGHT HAND  Final   Special Requests BOTTLES DRAWN AEROBIC AND ANAEROBIC BCAV  Final   Culture   Final    NO GROWTH < 12 HOURS Performed at [facility], [address] [place], [place] [address]    Report Status PENDING  Incomplete         Radiology Studies: US EKG SITE RITE  Result Date: 05/17/2020 If Site Rite image not attached, placement could not be confirmed due to current cardiac rhythm.       Scheduled Meds: . carbamazepine  100 mg Oral BID  . chlorhexidine  15 mL Mouth Rinse BID  . Chlorhexidine Gluconate Cloth  6 each Topical Daily  . collagenase   Topical Daily  . enoxaparin (LOVENOX) injection  40 mg Subcutaneous Q24H  . hydrocortisone sod succinate (SOLU-CORTEF) inj  50 mg Intravenous Q6H  . [START ON 05/19/2020] levothyroxine  50 mcg Intravenous Q0600  . mouth rinse  15 mL Mouth Rinse q12n4p  . midodrine  10 mg Oral TID WC  . polyethylene glycol  17 g Oral Daily  . sodium chloride flush  10-40 mL Intracatheter Q12H   Continuous Infusions: . sodium chloride 1,000 mL (05/16/20 0826)  . dextrose 50 mL/hr at 05/18/20 1100  . meropenem (MERREM) IV    . norepinephrine (LEVOPHED) Adult infusion Stopped (05/18/20 1100)  . vancomycin 750 mg (05/18/20 1006)     LOS: 3 days    Critical Care Time Upon my evaluation, this patient had a high probability of imminent or life-threatening deterioration due to septic shock secondary to strep pyogenes/Enterobacter septicemia, which required my direct attention, intervention, and personal management.  I have personally provided 42 minutes of critical care time exclusive of my time spent on separately billable procedures.  Time includes review of laboratory data, radiology results, discussion with consultants, and monitoring for potential decompensation.       Alvira PhilipsEric J  [place], DO [facility] Available via Epic secure chat 7am-7pm After these hours, please refer to coverage provider listed on [email or web address] 05/18/2020, 12:35 PM

## 2020-05-18 NOTE — Progress Notes (Addendum)
PHARMACY - PHYSICIAN COMMUNICATION CRITICAL VALUE ALERT - BLOOD CULTURE IDENTIFICATION (BCID)  Joanne [name] is an 49 y.o. female who presented to [facility] on 05/15/2020 with a chief complaint of AMS w/ h/o TBI w/ chronic trach  Assessment:  Tachypneic, hypotensive 80's systolic, LA 2.8 >> 2.7 >> 3.7, WBC 21.9, CT pelvis: IMPRESSION: 1. Large sacral decubitus ulcer which has progressed significantly since prior study. Extension to the underlying sacrum and coccyx with evidence of osteomyelitis and bone destruction. 2. No evidence of fluid collection or abscess. 3. Stable midline ventral hernia as without evidence of bowel obstruction or strangulation.  Name of physician (or Provider) Contacted: Webb Silversmith  Current antibiotics: vanc/cefepime  Changes to prescribed antibiotics recommended:  Recommendations declined by provider due to not having enough sensitivities for proteus to switch to ceftriaxone.  Results for orders placed or performed during the hospital encounter of 05/15/20  Blood Culture ID Panel (Reflexed) (Collected: 05/15/2020  1:29 PM)  Result Value Ref Range   Enterococcus species NOT DETECTED NOT DETECTED   Listeria monocytogenes NOT DETECTED NOT DETECTED   Staphylococcus species NOT DETECTED NOT DETECTED   Staphylococcus aureus (BCID) NOT DETECTED NOT DETECTED   Streptococcus species NOT DETECTED NOT DETECTED   Streptococcus agalactiae NOT DETECTED NOT DETECTED   Streptococcus pneumoniae NOT DETECTED NOT DETECTED   Streptococcus pyogenes NOT DETECTED NOT DETECTED   Acinetobacter baumannii NOT DETECTED NOT DETECTED   Enterobacteriaceae species DETECTED (A) NOT DETECTED   Enterobacter cloacae complex NOT DETECTED NOT DETECTED   Escherichia coli NOT DETECTED NOT DETECTED   Klebsiella oxytoca NOT DETECTED NOT DETECTED   Klebsiella pneumoniae NOT DETECTED NOT DETECTED   Proteus species DETECTED (A) NOT DETECTED   Serratia marcescens NOT DETECTED NOT DETECTED    Carbapenem resistance NOT DETECTED NOT DETECTED   Haemophilus influenzae NOT DETECTED NOT DETECTED   Neisseria meningitidis NOT DETECTED NOT DETECTED   Pseudomonas aeruginosa NOT DETECTED NOT DETECTED   Candida albicans NOT DETECTED NOT DETECTED   Candida glabrata NOT DETECTED NOT DETECTED   Candida krusei NOT DETECTED NOT DETECTED   Candida parapsilosis NOT DETECTED NOT DETECTED   Candida tropicalis NOT DETECTED NOT DETECTED   Thomasene Ripple, PharmD, BCPS Clinical Pharmacist 05/18/2020  5:54 AM
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
## 2020-05-18 NOTE — Progress Notes (Signed)
Patient alert and oriented to self and place. Patient continues to be on Levophed, currently at 7 mcg/min. BP remains stable. Patient kept on bair hugger to maintain temperature this shift. 2nd PIV obtained due to PICC can not be placed until today per IV team. Patient has been turned every 2 hours per order for wounds to her sacrum, hip and back. Left foot is in a prevalon boot and all dressings were changed per order this morning. Foley draining well this shift.

## 2020-05-18 NOTE — Progress Notes (Signed)
*  PRELIMINARY RESULTS* Echocardiogram 2D Echocardiogram has been performed.  [name] 05/18/2020, 10:52 AM
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
## 2020-05-19 DIAGNOSIS — N179 Acute kidney failure, unspecified: Secondary | ICD-10-CM | POA: Diagnosis not present

## 2020-05-19 DIAGNOSIS — L89154 Pressure ulcer of sacral region, stage 4: Secondary | ICD-10-CM | POA: Diagnosis not present

## 2020-05-19 DIAGNOSIS — E870 Hyperosmolality and hypernatremia: Secondary | ICD-10-CM

## 2020-05-19 DIAGNOSIS — A4159 Other Gram-negative sepsis: Secondary | ICD-10-CM | POA: Diagnosis not present

## 2020-05-19 DIAGNOSIS — R6521 Severe sepsis with septic shock: Secondary | ICD-10-CM | POA: Diagnosis not present

## 2020-05-19 LAB — COMPREHENSIVE METABOLIC PANEL
ALT: 21 U/L (ref 0–44)
AST: 24 U/L (ref 15–41)
Albumin: <1 g/dL — ABNORMAL LOW (ref 3.5–5.0)
Alkaline Phosphatase: 116 U/L (ref 38–126)
Anion gap: 10 (ref 5–15)
BUN: 16 mg/dL (ref 6–20)
CO2: 14 mmol/L — ABNORMAL LOW (ref 22–32)
Calcium: 7.3 mg/dL — ABNORMAL LOW (ref 8.9–10.3)
Chloride: 127 mmol/L — ABNORMAL HIGH (ref 98–111)
Creatinine, Ser: 0.78 mg/dL (ref 0.44–1.00)
GFR calc Af Amer: >60 mL/min (ref >60)
GFR calc non Af Amer: >60 mL/min (ref >60)
Glucose, Bld: 117 mg/dL — ABNORMAL HIGH (ref 70–99)
Potassium: 3.9 mmol/L (ref 3.5–5.1)
Sodium: 151 mmol/L — ABNORMAL HIGH (ref 135–145)
Total Bilirubin: 0.7 mg/dL (ref 0.3–1.2)
Total Protein: 4.8 g/dL — ABNORMAL LOW (ref 6.5–8.1)

## 2020-05-19 LAB — ECHOCARDIOGRAM COMPLETE
Height: 64 in
S' Lateral: 1.77 cm
Weight: 2162.27 oz

## 2020-05-19 LAB — CBC
HCT: 25.2 % — ABNORMAL LOW (ref 36.0–46.0)
Hemoglobin: 8.7 g/dL — ABNORMAL LOW (ref 12.0–15.0)
MCH: 28.8 pg (ref 26.0–34.0)
MCHC: 34.5 g/dL (ref 30.0–36.0)
MCV: 83.4 fL (ref 80.0–100.0)
Platelets: 120 10*3/uL — ABNORMAL LOW (ref 150–400)
RBC: 3.02 MIL/uL — ABNORMAL LOW (ref 3.87–5.11)
RDW: 18.8 % — ABNORMAL HIGH (ref 11.5–15.5)
WBC: 20.2 10*3/uL — ABNORMAL HIGH (ref 4.0–10.5)
nRBC: 0.3 % — ABNORMAL HIGH (ref 0.0–0.2)

## 2020-05-19 LAB — MAGNESIUM: Magnesium: 1.6 mg/dL — ABNORMAL LOW (ref 1.7–2.4)

## 2020-05-19 MED ORDER — ALBUMIN HUMAN 25 % IV SOLN
50.0000 g | Freq: Once | INTRAVENOUS | Status: AC
  Administered 2020-05-19: 22:00:00 50 g via INTRAVENOUS
  Filled 2020-05-19: qty 200

## 2020-05-19 MED ORDER — MAGNESIUM SULFATE 2 GM/50ML IV SOLN
2.0000 g | Freq: Once | INTRAVENOUS | Status: AC
  Administered 2020-05-19: 19:00:00 2 g via INTRAVENOUS
  Filled 2020-05-19: qty 50

## 2020-05-19 MED ORDER — DRONABINOL 2.5 MG PO CAPS
2.5000 mg | ORAL_CAPSULE | Freq: Two times a day (BID) | ORAL | Status: DC
  Administered 2020-05-19 – 2020-05-25 (×5): 2.5 mg via ORAL
  Filled 2020-05-19 (×7): qty 1

## 2020-05-19 MED ORDER — DEXTROSE 5 % IV SOLN: INTRAVENOUS | Status: DC

## 2020-05-19 MED ORDER — SODIUM CHLORIDE 0.9 % IV SOLN
250.0000 mL | INTRAVENOUS | Status: DC
  Administered 2020-05-19 – 2020-06-07 (×7): 250 mL via INTRAVENOUS

## 2020-05-19 MED ORDER — SODIUM CHLORIDE 0.45 % IV BOLUS
1000.0000 mL | Freq: Once | INTRAVENOUS | Status: AC
  Administered 2020-05-19: 19:00:00 1000 mL via INTRAVENOUS

## 2020-05-19 MED ORDER — NOREPINEPHRINE 4 MG/250ML-% IV SOLN
2.0000 ug/min | INTRAVENOUS | Status: DC
  Administered 2020-05-20: 2 ug/min via INTRAVENOUS
  Filled 2020-05-19 (×2): qty 250

## 2020-05-19 MED ORDER — ALBUMIN HUMAN 25 % IV SOLN
50.0000 g | Freq: Once | INTRAVENOUS | Status: AC
  Administered 2020-05-19: 15:00:00 50 g via INTRAVENOUS
  Filled 2020-05-19: qty 200

## 2020-05-19 MED ORDER — PIPERACILLIN-TAZOBACTAM 3.375 G IVPB
3.3750 g | Freq: Three times a day (TID) | INTRAVENOUS | Status: DC
  Administered 2020-05-19 – 2020-05-28 (×25): 3.375 g via INTRAVENOUS
  Filled 2020-05-19 (×28): qty 50

## 2020-05-19 NOTE — Consult Note (Signed)
Pharmacy Antibiotic Note  Joanne [name] is a 49 y.o. female admitted on 05/15/2020 with bacteremia.  Pharmacy has been consulted for pip/tazo dosing. ID following.   7/24 Bcx 1 out 4 bottles: PEPTOSTREPTOCOCCUS SPECIES PROTEUS MIRABILIS  Plan: Pip/tazo 3.375 mg q8H EI.  Height: 5\' 4"  (162.6 cm) Weight: 61.3 kg (135 lb 2.3 oz) IBW/kg (Calculated) : 54.7  Temp (24hrs), Avg:97.5 F (36.4 C), Min:97.3 F (36.3 C), Max:97.7 F (36.5 C)  Recent Labs  Lab 05/15/20 1329 05/15/20 1329 05/15/20 1555 05/16/20 0510 05/16/20 0510 05/16/20 1716 05/17/20 0038 05/17/20 0519 05/17/20 0519 05/17/20 0927 05/17/20 1233 05/17/20 1748 05/18/20 0046 05/18/20 0047 05/18/20 0657 05/19/20 0409  WBC 22.1*  --   --  22.8*  --   --   --  21.9*  --   --   --   --   --   --  21.2* 20.2*  CREATININE 1.56*   < >  --  1.12*   < > 0.95   < > 0.73   < >  --  0.89 0.82 0.85  --  0.89 0.78  LATICACIDVEN 5.1*   < > 3.9* 3.1*  --  2.8*  --   --   --  2.7*  --   --   --  3.7*  --   --    < > = values in this interval not displayed.    Estimated Creatinine Clearance: 72.6 mL/min (by C-G formula based on SCr of 0.78 mg/dL).    Allergies  Allergen Reactions  . Kaopectate  [Attapulgite] Itching  . Thiopental Itching  . Tomato     Antimicrobials this admission: 7/24 cefepime >> 7/27 7/27 meropenem >> 7/28 7/24 vancomycin >> 7/28   Dose adjustments this admission: none  Microbiology results: 7/27 BCx: pending 7/25 MRSA PCR: negative  Thank you for allowing pharmacy to be a part of this patient's care.  8/25 05/19/2020 6:40 PM
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
## 2020-05-19 NOTE — Progress Notes (Signed)
IV TEAM: Primary RN placed consult for blood draw. This VAST RN informed care RN that IV team do not stick or perform phlebotomy for labs. Consult discontinued.

## 2020-05-19 NOTE — Progress Notes (Signed)
Called regarding persistent hypotension despite IVF and albumin. Discussed with critical care, appreciate their recommendations. BP was checked in different extremity, MAP >60. Will continue to monitor closely on current unit.

## 2020-05-19 NOTE — Progress Notes (Signed)
PROGRESS NOTE    Joanne [name]  TFT:[id number] DOB: 1971/05/04 DOA: 05/15/2020 PCP: Patient, No Pcp Per   Chief complaint. Confusion.  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 She was diagnosed with septic shock secondary to decubitus ulcer infection and osteomyelitis. She was placed on broad-spectrum antibiotics with vancomycin and cefepime. She has been seen by infectious disease and general surgery. Bedside debridement was performed. Her initial blood culture was positive for Peptostreptococcus species and Proteus mirabilis. Currently covered with vancomycin and meropenem. Patient has been evaluated by ID, patient condition is very serious, further treatment will require surgery with surgical debridement and removal of infected bones. Patient will also need a diverging colostomy.  7/28. I have discussion with patient legal guardian. Patient normally can communicate. Mental status seem to be worse since admitted to the hospital. Patient currently has significant hypotension with albumin less than 1.0. She will not be able to tolerate a surgery at this point. We will try to improve patient overall condition before considering surgery. We also discussed patient CODE STATUS, still want to be full code.    Assessment & Plan:   Principal Problem:   Severe sepsis with septic shock (HCC) Active Problems:   Acute on chronic combined systolic and diastolic CHF (congestive heart failure) (HCC)   Tracheostomy status (HCC)   Traumatic brain injury ([place])   Hypernatremia   AKI (acute kidney injury) ([place])   Hypothyroidism   Hypokalemia   Weakness   Full code status  #1. Septic shock with Peptostreptococcus and Proteus septicemia. Secondary to sacral decubitus ulcer and osteomyelitis. Patient still have significant  hypotension, spoke with family, patient blood pressure has been low at baseline. She has a severe hypoalbuminemia with albumin level less than 1.0. She is also third spacing. She is given 50 g albumin for now. I also start IV fluids with D5 water as she has a severe hypernatremia. Also check a cortisol level to rule out adrenal insufficiency. Continue antibiotics with vancomycin and meropenem for now. Appreciated ID consult. For treating patient condition, patient will need surgical debridement as well as removal of the infected bone and a diverting ostomy. Given patient current condition, patient will not survive the surgery. Family still wish to continue full treatment. I need to improve patient general condition before considering surgical options.  #2. Severe protein calorie malnutrition. Patient albumin level less than 1.0. She is receiving 50 g of albumin to stabilize her blood pressure. I also add lower dose Marinol to improve her appetite.  #3. Hypernatremia. Appears to be secondary to poor p.o. intake. Will start D5 water as patient has significant water deficit. Check a BMP level again tomorrow.  4. Acute metabolic encephalopathy. Secondary to infection, hypotension and hypernatremia. We will continue to treat underlying conditions.  5. Acute kidney injury on chronic kidney disease stage II. Renal function improved.  6. Chronic pain syndrome. Continue to hold pain medicine due to low blood pressure.  7. Traumatic brain injury. Supportive care.  8. Hypokalemia and hypomagnesemia. Potassium normalized, continue supplement magnesium.   DVT prophylaxis: Lovenox Code Status: Full Family Communication: Had a long discussion with the patient legal guardian.  Disposition Plan:  . Patient came from:            . Anticipated d/c place: . Barriers to d/c OR conditions which need  to be met to effect a safe d/c:   Consultants:   General surgery  ID  Procedures: Bedside wound  debridement. Antimicrobials: Comycin and meropenem.  Subjective: Patient has significant confusion, does not respond to verbal command. But she is awake. Does not seem to have any short of breath. No additional fever or chills. Poor appetite.  Objective: Vitals:   05/19/20 1325 05/19/20 1329 05/19/20 1346 05/19/20 1354  BP: (!) 66/39 (!) 73/40 (!) 70/49 (!) 77/40  Pulse: 92 90 90 88  Resp: (!) 24  18 20   Temp:      TempSrc:      SpO2: 100%  100% 100%  Weight:      Height:        Intake/Output Summary (Last 24 hours) at 05/19/2020 1558 Last data filed at 05/19/2020 1100 Gross per 24 hour  Intake 2142.22 ml  Output 1900 ml  Net 242.22 ml   Filed Weights   05/15/20 1320 05/16/20 1420  Weight: 63.5 kg 61.3 kg    Examination:  General exam: Appears calm but not respond to verbal command. Respiratory system: Clear to auscultation. Respiratory effort normal. Cardiovascular system: S1 & S2 heard, RRR. No JVD, murmurs, rubs, gallops or clicks. No pedal edema. Gastrointestinal system: Abdomen is nondistended, soft and nontender. No organomegaly or masses felt. Normal bowel sounds heard. Central nervous system: Alert.  No focal neurological deficits. Extremities: Symmetric  Skin: Sacral decubitus ulcer     Data Reviewed: I have personally reviewed following labs and imaging studies  CBC: Recent Labs  Lab 05/15/20 1329 05/16/20 0510 05/17/20 0519 05/18/20 0657 05/19/20 0409  WBC 22.1* 22.8* 21.9* 21.2* 20.2*  NEUTROABS 19.5*  --   --   --   --   HGB 12.8 9.3* 9.2* 9.3* 8.7*  HCT 42.0 30.3* 28.2* 28.6* 25.2*  MCV 91.7 92.9 88.4 87.5 83.4  PLT 273 166 163 155 338*   Basic Metabolic Panel: Recent Labs  Lab 05/16/20 0510 05/16/20 1716 05/17/20 1233 05/17/20 1748 05/18/20 0046 05/18/20 0657 05/19/20 0409  NA 158*   < > 150* 151* 151* 150* 151*  K 2.4*   < > 3.9 4.4 3.9 3.1* 3.9  CL 129*   < > 127* 128* 124* 126* 127*  CO2 21*   < > 18* 15* 19* 17* 14*   GLUCOSE 183*   < > 96 110* 88 112* 117*  BUN 37*   < > 21* 20 18 16 16   CREATININE 1.12*   < > 0.89 0.82 0.85 0.89 0.78  CALCIUM 6.9*   < > 6.9* 7.1* 7.5* 7.4* 7.3*  MG 1.8  --   --   --   --  1.5* 1.6*  PHOS 1.9*  --   --   --   --   --   --    < > = values in this interval not displayed.   GFR: Estimated Creatinine Clearance: 72.6 mL/min (by C-G formula based on SCr of 0.78 mg/dL). Liver Function Tests: Recent Labs  Lab 05/15/20 1329 05/16/20 0510 05/18/20 0657 05/19/20 0409  AST 38 26 22 24   ALT 25 17 21 21   ALKPHOS 172* 115 104 116  BILITOT 0.8 0.7 0.8 0.7  PROT 8.6* 5.8* 5.3* 4.8*  ALBUMIN 1.5* <1.0* 1.1* <1.0*   No results for input(s): LIPASE, AMYLASE in the last 168 hours. No results for input(s): AMMONIA in the last 168 hours. Coagulation Profile: No results for input(s): INR, PROTIME in the last 168  hours. Cardiac Enzymes: No results for input(s): CKTOTAL, CKMB, CKMBINDEX, TROPONINI in the last 168 hours. BNP (last 3 results) No results for input(s): PROBNP in the last 8760 hours. HbA1C: No results for input(s): HGBA1C in the last 72 hours. CBG: Recent Labs  Lab 05/16/20 1422  GLUCAP 86   Lipid Profile: No results for input(s): CHOL, HDL, LDLCALC, TRIG, CHOLHDL, LDLDIRECT in the last 72 hours. Thyroid Function Tests: No results for input(s): TSH, T4TOTAL, FREET4, T3FREE, THYROIDAB in the last 72 hours. Anemia Panel: No results for input(s): VITAMINB12, FOLATE, FERRITIN, TIBC, IRON, RETICCTPCT in the last 72 hours. Sepsis Labs: Recent Labs  Lab 05/16/20 0510 05/16/20 1716 05/17/20 0519 05/17/20 0927 05/18/20 0047 05/18/20 0657  PROCALCITON 0.47  --  0.37  --   --  0.28  LATICACIDVEN 3.1* 2.8*  --  2.7* 3.7*  --     Recent Results (from the past 240 hour(s))  Blood Culture (routine x 2)     Status: None (Preliminary result)   Collection Time: 05/15/20  1:29 PM   Specimen: BLOOD  Result Value Ref Range Status   Specimen Description BLOOD R ARM   Final   Special Requests   Final    BOTTLES DRAWN AEROBIC AND ANAEROBIC Blood Culture results may not be optimal due to an excessive volume of blood received in culture bottles   Culture   Final    NO GROWTH 4 DAYS Performed at [facility], [address], [place], [place] [address]    Report Status PENDING  Incomplete  Blood Culture (routine x 2)     Status: Abnormal (Preliminary result)   Collection Time: 05/15/20  1:29 PM   Specimen: BLOOD  Result Value Ref Range Status   Specimen Description   Final    BLOOD L AC Performed at [facility], [address], [place], [place] [address]    Special Requests   Final    BOTTLES DRAWN AEROBIC AND ANAEROBIC Blood Culture results may not be optimal due to an inadequate volume of blood received in culture bottles Performed at [facility], [address] [place], [place] [address]    Culture  Setup Time   Final    Organism ID to follow [place] TO, READ BACK BY AND VERIFIED WITH: MYRA SLAUGHTER ON 05/16/2020 AT [place] BOTTLE ONLY CRITICAL RESULT CALLED TO, READ BACK BY AND VERIFIED WITH: DAVID BASANTI 05/18/20 0520 SJL/RWW Performed at [facility], [address], [place], [place] [address]    Culture (A)  Final    PEPTOSTREPTOCOCCUS SPECIES PROTEUS MIRABILIS SUSCEPTIBILITIES TO FOLLOW Performed at [facility], [place] [address] [place], [place] [address]    Report Status PENDING  Incomplete  Blood Culture ID Panel (Reflexed)     Status: None   Collection Time: 05/15/20  1:29 PM  Result Value Ref Range Status   Enterococcus species NOT DETECTED NOT DETECTED Final   Listeria monocytogenes NOT DETECTED NOT DETECTED Final   Staphylococcus species NOT DETECTED NOT DETECTED Final   Staphylococcus aureus (BCID) NOT DETECTED NOT DETECTED Final   Streptococcus species NOT DETECTED NOT DETECTED Final    Streptococcus agalactiae NOT DETECTED NOT DETECTED Final   Streptococcus pneumoniae NOT DETECTED NOT DETECTED Final   Streptococcus pyogenes NOT DETECTED NOT DETECTED Final   Acinetobacter baumannii NOT DETECTED NOT DETECTED Final   Enterobacteriaceae species NOT DETECTED NOT DETECTED Final  Enterobacter cloacae complex NOT DETECTED NOT DETECTED Final   Escherichia coli NOT DETECTED NOT DETECTED Final   Klebsiella oxytoca NOT DETECTED NOT DETECTED Final   Klebsiella pneumoniae NOT DETECTED NOT DETECTED Final   Proteus species NOT DETECTED NOT DETECTED Final   Serratia marcescens NOT DETECTED NOT DETECTED Final   Haemophilus influenzae NOT DETECTED NOT DETECTED Final   Neisseria meningitidis NOT DETECTED NOT DETECTED Final   Pseudomonas aeruginosa NOT DETECTED NOT DETECTED Final   Candida albicans NOT DETECTED NOT DETECTED Final   Candida glabrata NOT DETECTED NOT DETECTED Final   Candida krusei NOT DETECTED NOT DETECTED Final   Candida parapsilosis NOT DETECTED NOT DETECTED Final   Candida tropicalis NOT DETECTED NOT DETECTED Final    Comment: Performed at [facility], [address] [place], [place] [address]  Blood Culture ID Panel (Reflexed)     Status: Abnormal   Collection Time: 05/15/20  1:29 PM  Result Value Ref Range Status   Enterococcus species NOT DETECTED NOT DETECTED Final   Listeria monocytogenes NOT DETECTED NOT DETECTED Final   Staphylococcus species NOT DETECTED NOT DETECTED Final   Staphylococcus aureus (BCID) NOT DETECTED NOT DETECTED Final   Streptococcus species NOT DETECTED NOT DETECTED Final   Streptococcus agalactiae NOT DETECTED NOT DETECTED Final   Streptococcus pneumoniae NOT DETECTED NOT DETECTED Final   Streptococcus pyogenes NOT DETECTED NOT DETECTED Final   Acinetobacter baumannii NOT DETECTED NOT DETECTED Final   Enterobacteriaceae species DETECTED (A) NOT DETECTED Final    Comment: Enterobacteriaceae represent a large family of  gram-negative bacteria, not a single organism. CRITICAL RESULT CALLED TO, READ BACK BY AND VERIFIED WITH: DAVID BASANTI 05/18/20 0520 SJL    Enterobacter cloacae complex NOT DETECTED NOT DETECTED Final   Escherichia coli NOT DETECTED NOT DETECTED Final   Klebsiella oxytoca NOT DETECTED NOT DETECTED Final   Klebsiella pneumoniae NOT DETECTED NOT DETECTED Final   Proteus species DETECTED (A) NOT DETECTED Final    Comment: CRITICAL RESULT CALLED TO, READ BACK BY AND VERIFIED WITH: DAVID BASANTI 05/18/20 0520 SJL    Serratia marcescens NOT DETECTED NOT DETECTED Final   Carbapenem resistance NOT DETECTED NOT DETECTED Final   Haemophilus influenzae NOT DETECTED NOT DETECTED Final   Neisseria meningitidis NOT DETECTED NOT DETECTED Final   Pseudomonas aeruginosa NOT DETECTED NOT DETECTED Final   Candida albicans NOT DETECTED NOT DETECTED Final   Candida glabrata NOT DETECTED NOT DETECTED Final   Candida krusei NOT DETECTED NOT DETECTED Final   Candida parapsilosis NOT DETECTED NOT DETECTED Final   Candida tropicalis NOT DETECTED NOT DETECTED Final    Comment: Performed at [facility], [address], [place], [place] [address]  Urine culture     Status: Abnormal   Collection Time: 05/15/20  2:03 PM   Specimen: In/Out Cath Urine  Result Value Ref Range Status   Specimen Description   Final    IN/OUT CATH URINE Performed at [facility], [address], [place], [place] [address]    Special Requests   Final    NONE Performed at [facility], [address], [place], [place] [address]    Culture (A)  Final    <10,000 COLONIES/mL INSIGNIFICANT GROWTH Performed at [facility], [place] [address], [place], [place] [address]    Report Status 05/16/2020 FINAL  Final  SARS Coronavirus 2 by RT PCR (hospital order, performed in [facility] hospital lab) Nasopharyngeal Nasopharyngeal Swab     Status:  None   Collection Time: 05/16/20 12:07 AM   Specimen:  Nasopharyngeal Swab  Result Value Ref Range Status   SARS Coronavirus 2 NEGATIVE NEGATIVE Final    Comment: (NOTE) SARS-CoV-2 target nucleic acids are NOT DETECTED.  The SARS-CoV-2 RNA is generally detectable in upper and lower respiratory specimens during the acute phase of infection. The lowest concentration of SARS-CoV-2 viral copies this assay can detect is 250 copies / mL. A negative result does not preclude SARS-CoV-2 infection and should not be used as the sole basis for treatment or other patient management decisions.  A negative result may occur with improper specimen collection / handling, submission of specimen other than nasopharyngeal swab, presence of viral mutation(s) within the areas targeted by this assay, and inadequate number of viral copies (<250 copies / mL). A negative result must be combined with clinical observations, patient history, and epidemiological information.  Fact Sheet for Patients:   StrictlyIdeas.no  Fact Sheet for Healthcare Providers: BankingDealers.co.za  This test is not yet approved or  cleared by the [place] FDA and has been authorized for detection and/or diagnosis of SARS-CoV-2 by FDA under an Emergency Use Authorization (EUA).  This EUA will remain in effect (meaning this test can be used) for the duration of the COVID-19 declaration under Section 564(b)(1) of the Act, 21 U.S.C. section 360bbb-3(b)(1), unless the authorization is terminated or revoked sooner.  Performed at [facility], [address] [place], [place] [address]   MRSA PCR Screening     Status: None   Collection Time: 05/16/20  2:37 PM   Specimen: Nasopharyngeal  Result Value Ref Range Status   MRSA by PCR NEGATIVE NEGATIVE Final    Comment:        The GeneXpert MRSA Assay (FDA approved for NASAL specimens only), is one component of a comprehensive MRSA colonization surveillance program. It is  not intended to diagnose MRSA infection nor to guide or monitor treatment for MRSA infections. Performed at [facility], [address] [place], [place] [address]   CULTURE, BLOOD (ROUTINE X 2) w Reflex to ID Panel     Status: None (Preliminary result)   Collection Time: 05/18/20 12:46 AM   Specimen: BLOOD  Result Value Ref Range Status   Specimen Description BLOOD RIGHT HAND  Final   Special Requests BOTTLES DRAWN AEROBIC AND ANAEROBIC BCAV  Final   Culture   Final    NO GROWTH 1 DAY Performed at [facility], [address] [place], [place] [address]    Report Status PENDING  Incomplete  CULTURE, BLOOD (ROUTINE X 2) w Reflex to ID Panel     Status: None (Preliminary result)   Collection Time: 05/18/20  6:57 AM   Specimen: BLOOD  Result Value Ref Range Status   Specimen Description BLOOD RIGHT HAND  Final   Special Requests   Final    BOTTLES DRAWN AEROBIC ONLY Blood Culture adequate volume   Culture   Final    NO GROWTH 1 DAY Performed at [facility], [address], [place],  [address]    Report Status PENDING  Incomplete         Radiology Studies: ECHOCARDIOGRAM COMPLETE  Result Date: 05/19/2020    ECHOCARDIOGRAM REPORT   Patient Name:   BERYLE BAGSBY Date of Exam: 05/18/2020 Medical Rec #:  [id number]       Height:       64.0 in Accession #:    [id number]  Weight:       135.1 lb Date of Birth:  10/05/1971       BSA:          1.656 m Patient Age:    49 years        BP:           105/57 mmHg Patient Gender: F               HR:           96 bpm. Exam Location:  [facility] Procedure: 2D Echo, Color Doppler and Cardiac Doppler Indications:     Bacteremia 790.7  History:         Patient has prior history of Echocardiogram examinations, most                  recent 03/08/2020. Risk Factors:Diabetes.  Sonographer:     Sherrie Sport RDCS (AE) Referring Phys:  [id number] ERIC J [place] Diagnosing Phys: Ida Rogue MD  Sonographer Comments:  Technically challenging study due to limited acoustic windows, no apical window and no subcostal window. Patient is on trach breathing tube and is non-verbal. IMPRESSIONS  1. Left ventricular ejection fraction, by estimation, is 60 to 65%. The left ventricle has normal function. The left ventricle has no regional wall motion abnormalities. There is mild left ventricular hypertrophy. Left ventricular diastolic parameters are indeterminate.  2. Right ventricular systolic function is normal. The right ventricular size is normal.  3. No vegetation noted.  4. Challenging images. FINDINGS  Left Ventricle: Left ventricular ejection fraction, by estimation, is 60 to 65%. The left ventricle has normal function. The left ventricle has no regional wall motion abnormalities. The left ventricular internal cavity size was normal in size. There is  mild left ventricular hypertrophy. Left ventricular diastolic parameters are indeterminate. Right Ventricle: The right ventricular size is normal. No increase in right ventricular wall thickness. Right ventricular systolic function is normal. Left Atrium: Left atrial size was normal in size. Right Atrium: Right atrial size was normal in size. Pericardium: There is no evidence of pericardial effusion. Mitral Valve: The mitral valve is normal in structure. Normal mobility of the mitral valve leaflets. No evidence of mitral valve regurgitation. No evidence of mitral valve stenosis. Tricuspid Valve: The tricuspid valve is normal in structure. Tricuspid valve regurgitation is not demonstrated. No evidence of tricuspid stenosis. Aortic Valve: The aortic valve was not well visualized. Aortic valve regurgitation is not visualized. No aortic stenosis is present. Pulmonic Valve: The pulmonic valve was normal in structure. Pulmonic valve regurgitation is not visualized. No evidence of pulmonic stenosis. Aorta: The aortic root is normal in size and structure. Venous: The inferior vena cava is  normal in size with greater than 50% respiratory variability, suggesting right atrial pressure of 3 mmHg. IAS/Shunts: No atrial level shunt detected by color flow Doppler.  LEFT VENTRICLE PLAX 2D LVIDd:         3.20 cm LVIDs:         1.77 cm LV PW:         0.97 cm LV IVS:        1.03 cm LVOT diam:     1.90 cm LVOT Area:     2.84 cm  LEFT ATRIUM         Index LA diam:    2.30 cm 1.39 cm/m  PULMONIC VALVE AORTA                 PV Vmax:        0.90 m/s Ao Root diam: 2.30 cm PV Peak grad:   3.3 mmHg                       RVOT Peak grad: 3 mmHg   SHUNTS Systemic Diam: 1.90 cm Ida Rogue MD Electronically signed by Ida Rogue MD Signature Date/Time: 05/19/2020/6:46:48 AM    Final    [place] EKG SITE RITE  Result Date: 05/17/2020 If Site Rite image not attached, placement could not be confirmed due to current cardiac rhythm.       Scheduled Meds: . carbamazepine  100 mg Oral BID  . chlorhexidine  15 mL Mouth Rinse BID  . Chlorhexidine Gluconate Cloth  6 each Topical Daily  . collagenase   Topical Daily  . dronabinol  2.5 mg Oral BID AC  . enoxaparin (LOVENOX) injection  40 mg Subcutaneous Q24H  . hydrocortisone sod succinate (SOLU-CORTEF) inj  50 mg Intravenous Q6H  . levothyroxine  50 mcg Intravenous Q0600  . mouth rinse  15 mL Mouth Rinse q12n4p  . midodrine  10 mg Oral TID WC  . polyethylene glycol  17 g Oral Daily  . sodium chloride flush  10-40 mL Intracatheter Q12H   Continuous Infusions: . sodium chloride 1,000 mL (05/16/20 0826)  . dextrose    . meropenem (MERREM) IV Stopped (05/19/20 3795)  . norepinephrine (LEVOPHED) Adult infusion Stopped (05/18/20 1100)  . vancomycin 150 mL/hr at 05/19/20 1100     LOS: 4 days    Time spent: 45 minutes    Sharen Hones, MD [facility]   To contact the attending provider between 7A-7P or the covering provider during after hours 7P-7A, please log into the web site [email or web address] and access using universal  [place] password for that web site. If you do not have the password, please call the hospital operator.  05/19/2020, 3:58 PM

## 2020-05-19 NOTE — Progress Notes (Signed)
Pt on the unit from ICU. Pt hypotensive at 77/40. MD notified. Recommend 4 rounds Albumin then reassess post infusion. RN to monitor for further deviation from baseline. Will continue to reassess q71min

## 2020-05-19 NOTE — Progress Notes (Signed)
ID Out of ICU BP (!) 77/40 (BP Location: Right Arm)   Pulse 88   Temp 97.6 F (36.4 C) (Oral)   Resp 20   Ht 5\' 4"  (1.626 m)   Wt 61.3 kg   LMP  (LMP Unknown)   SpO2 100%   BMI 23.20 kg/m    O/e Non verbal  Lethargic Did open eyes when I lifted her leg Tracheostomy- on oxygen Chest b/l air entry Hss1s2 Left heel stage II Rt heel okay And - scar  Sacral decubitus        CBC Latest Ref Rng & Units 05/19/2020 05/18/2020 05/17/2020  WBC 4.0 - 10.5 K/uL 20.2(H) 21.2(H) 21.9(H)  Hemoglobin 12.0 - 15.0 g/dL 05/19/2020) 4.2(H) 0.6(C)  Hematocrit 36 - 46 % 25.2(L) 28.6(L) 28.2(L)  Platelets 150 - 400 K/uL 120(L) 155 163    CMP Latest Ref Rng & Units 05/19/2020 05/18/2020 05/18/2020  Glucose 70 - 99 mg/dL 05/20/2020) 283(T) 88  BUN 6 - 20 mg/dL 16 16 18   Creatinine 0.44 - 1.00 mg/dL 517(O 1.60  Sodium 135 - 145 mmol/L 151(H) 150(H) 151(H)  Potassium 3.5 - 5.1 mmol/L 3.9 3.1(L) 3.9  Chloride 98 - 111 mmol/L 127(H) 126(H) 124(H)  CO2 22 - 32 mmol/L 14(L) 17(L) 19(L)  Calcium 8.9 - 10.3 mg/dL 7.3(L) 7.4(L) 7.5(L)  Total Protein 6.5 - 8.1 g/dL 4.8(L) 5.3(L) -  Total Bilirubin 0.3 - 1.2 mg/dL 0.7 0.8 -  Alkaline Phos 38 - 126 U/L 116 104 -  AST 15 - 41 U/L 24 22 -  ALT 0 - 44 U/L 21 21 -    Impression/Recommendation ?Septic shock due to Proteus bacteremia and peptostreptococcus On IV vanco and meropenem Source  Is the 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 It is futile to treat with antibiotics alone as this  is not going to heal the infection.  If aggressive management is being pursued then she needs surgical intervention with removal of the infected bone with wound VAC and a diverting colostomy.   Patient is currently on meropenem.  will change to zosyn- Dc vanco  Hypernatremia: Dehydration/ poor intake. Being corrected  Management as per primary team.  Hypokalemia being corrected  Hypothyroidism on  thyroxine.  AKI: Combination of prerenal and sepsis  History of traumatic brain injury  Recent history of Streptococcus pyogenes and E. coli bacteremia and was treated appropriately with IV antibiotics.  Dr.Fitzgerald will follow her for the next 2 days

## 2020-05-20 ENCOUNTER — Inpatient Hospital Stay: Payer: Medicare PPO

## 2020-05-20 ENCOUNTER — Inpatient Hospital Stay: Payer: Self-pay

## 2020-05-20 DIAGNOSIS — A419 Sepsis, unspecified organism: Secondary | ICD-10-CM | POA: Diagnosis not present

## 2020-05-20 DIAGNOSIS — I5043 Acute on chronic combined systolic (congestive) and diastolic (congestive) heart failure: Secondary | ICD-10-CM | POA: Diagnosis not present

## 2020-05-20 DIAGNOSIS — R6521 Severe sepsis with septic shock: Secondary | ICD-10-CM

## 2020-05-20 DIAGNOSIS — E43 Unspecified severe protein-calorie malnutrition: Secondary | ICD-10-CM | POA: Insufficient documentation

## 2020-05-20 DIAGNOSIS — J9621 Acute and chronic respiratory failure with hypoxia: Secondary | ICD-10-CM

## 2020-05-20 LAB — BASIC METABOLIC PANEL
Anion gap: 15 (ref 5–15)
Anion gap: 9 (ref 5–15)
BUN: 18 mg/dL (ref 6–20)
BUN: 19 mg/dL (ref 6–20)
CO2: 11 mmol/L — ABNORMAL LOW (ref 22–32)
CO2: 18 mmol/L — ABNORMAL LOW (ref 22–32)
Calcium: 7.7 mg/dL — ABNORMAL LOW (ref 8.9–10.3)
Calcium: 7.7 mg/dL — ABNORMAL LOW (ref 8.9–10.3)
Chloride: 125 mmol/L — ABNORMAL HIGH (ref 98–111)
Chloride: 123 mmol/L — ABNORMAL HIGH (ref 98–111)
Creatinine, Ser: 0.98 mg/dL (ref 0.44–1.00)
Creatinine, Ser: 1.09 mg/dL — ABNORMAL HIGH (ref 0.44–1.00)
GFR calc Af Amer: >60 mL/min (ref >60)
GFR calc Af Amer: >60 mL/min (ref >60)
GFR calc non Af Amer: >60 mL/min (ref >60)
GFR calc non Af Amer: 59 mL/min — ABNORMAL LOW (ref >60)
Glucose, Bld: 119 mg/dL — ABNORMAL HIGH (ref 70–99)
Glucose, Bld: 108 mg/dL — ABNORMAL HIGH (ref 70–99)
Potassium: 3 mmol/L — ABNORMAL LOW (ref 3.5–5.1)
Potassium: 2.4 mmol/L — CL (ref 3.5–5.1)
Sodium: 151 mmol/L — ABNORMAL HIGH (ref 135–145)
Sodium: 150 mmol/L — ABNORMAL HIGH (ref 135–145)

## 2020-05-20 LAB — CBC
HCT: 22.4 % — ABNORMAL LOW (ref 36.0–46.0)
HCT: 21.8 % — ABNORMAL LOW (ref 36.0–46.0)
Hemoglobin: 7.3 g/dL — ABNORMAL LOW (ref 12.0–15.0)
Hemoglobin: 7.3 g/dL — ABNORMAL LOW (ref 12.0–15.0)
MCH: 28.9 pg (ref 26.0–34.0)
MCH: 28.9 pg (ref 26.0–34.0)
MCHC: 32.6 g/dL (ref 30.0–36.0)
MCHC: 33.5 g/dL (ref 30.0–36.0)
MCV: 88.5 fL (ref 80.0–100.0)
MCV: 86.2 fL (ref 80.0–100.0)
Platelets: 123 10*3/uL — ABNORMAL LOW (ref 150–400)
Platelets: 97 10*3/uL — ABNORMAL LOW (ref 150–400)
RBC: 2.53 MIL/uL — ABNORMAL LOW (ref 3.87–5.11)
RBC: 2.53 MIL/uL — ABNORMAL LOW (ref 3.87–5.11)
RDW: 18.6 % — ABNORMAL HIGH (ref 11.5–15.5)
RDW: 18.9 % — ABNORMAL HIGH (ref 11.5–15.5)
WBC: 17 10*3/uL — ABNORMAL HIGH (ref 4.0–10.5)
WBC: 10.8 10*3/uL — ABNORMAL HIGH (ref 4.0–10.5)
nRBC: 0.4 % — ABNORMAL HIGH (ref 0.0–0.2)
nRBC: 0.3 % — ABNORMAL HIGH (ref 0.0–0.2)

## 2020-05-20 LAB — BLOOD GAS, ARTERIAL
Acid-base deficit: 10.2 mmol/L — ABNORMAL HIGH (ref 0.0–2.0)
Bicarbonate: 14.9 mmol/L — ABNORMAL LOW (ref 20.0–28.0)
FIO2: 1
Mechanical Rate: 20
O2 Saturation: 99.9 %
PEEP: 5 cmH2O
Patient temperature: 37
Pressure control: 30 cmH2O
pCO2 arterial: 29 mmHg — ABNORMAL LOW (ref 32.0–48.0)
pH, Arterial: 7.32 — ABNORMAL LOW (ref 7.350–7.450)
pO2, Arterial: 339 mmHg — ABNORMAL HIGH (ref 83.0–108.0)

## 2020-05-20 LAB — GLUCOSE, CAPILLARY
Glucose-Capillary: 114 mg/dL — ABNORMAL HIGH (ref 70–99)
Glucose-Capillary: 110 mg/dL — ABNORMAL HIGH (ref 70–99)

## 2020-05-20 LAB — COMPREHENSIVE METABOLIC PANEL
ALT: 17 U/L (ref 0–44)
AST: 16 U/L (ref 15–41)
Albumin: 3.3 g/dL — ABNORMAL LOW (ref 3.5–5.0)
Alkaline Phosphatase: 142 U/L — ABNORMAL HIGH (ref 38–126)
Anion gap: 12 (ref 5–15)
BUN: 18 mg/dL (ref 6–20)
CO2: 17 mmol/L — ABNORMAL LOW (ref 22–32)
Calcium: 7.6 mg/dL — ABNORMAL LOW (ref 8.9–10.3)
Chloride: 118 mmol/L — ABNORMAL HIGH (ref 98–111)
Creatinine, Ser: 1.02 mg/dL — ABNORMAL HIGH (ref 0.44–1.00)
GFR calc Af Amer: >60 mL/min (ref >60)
GFR calc non Af Amer: >60 mL/min (ref >60)
Glucose, Bld: 118 mg/dL — ABNORMAL HIGH (ref 70–99)
Potassium: 2.4 mmol/L — CL (ref 3.5–5.1)
Sodium: 147 mmol/L — ABNORMAL HIGH (ref 135–145)
Total Bilirubin: 0.9 mg/dL (ref 0.3–1.2)
Total Protein: 6.3 g/dL — ABNORMAL LOW (ref 6.5–8.1)

## 2020-05-20 LAB — CULTURE, BLOOD (ROUTINE X 2): Culture: NO GROWTH

## 2020-05-20 LAB — BRAIN NATRIURETIC PEPTIDE: B Natriuretic Peptide: 990.5 pg/mL — ABNORMAL HIGH (ref 0.0–100.0)

## 2020-05-20 LAB — LACTIC ACID, PLASMA
Lactic Acid, Venous: 1.4 mmol/L (ref 0.5–1.9)
Lactic Acid, Venous: 2 mmol/L (ref 0.5–1.9)

## 2020-05-20 LAB — PHOSPHORUS
Phosphorus: 3 mg/dL (ref 2.5–4.6)
Phosphorus: 3.5 mg/dL (ref 2.5–4.6)

## 2020-05-20 LAB — MAGNESIUM
Magnesium: 2.2 mg/dL (ref 1.7–2.4)
Magnesium: 2.3 mg/dL (ref 1.7–2.4)

## 2020-05-20 LAB — POTASSIUM: Potassium: 4.2 mmol/L (ref 3.5–5.1)

## 2020-05-20 LAB — PROCALCITONIN: Procalcitonin: 0.37 ng/mL

## 2020-05-20 MED ORDER — POTASSIUM CHLORIDE 20 MEQ PO PACK: 40.0000 meq | PACK | ORAL | Status: DC

## 2020-05-20 MED ORDER — FENTANYL CITRATE (PF) 100 MCG/2ML IJ SOLN
INTRAMUSCULAR | Status: AC
  Filled 2020-05-20: qty 2

## 2020-05-20 MED ORDER — POTASSIUM CHLORIDE 10 MEQ/100ML IV SOLN
10.0000 meq | INTRAVENOUS | Status: AC
  Administered 2020-05-20 (×3): 10 meq via INTRAVENOUS
  Filled 2020-05-20 (×3): qty 100

## 2020-05-20 MED ORDER — POTASSIUM CHLORIDE 10 MEQ/100ML IV SOLN
10.0000 meq | INTRAVENOUS | Status: AC
  Administered 2020-05-20 (×5): 10 meq via INTRAVENOUS
  Filled 2020-05-20 (×5): qty 100

## 2020-05-20 MED ORDER — FENTANYL CITRATE (PF) 100 MCG/2ML IJ SOLN
50.0000 ug | INTRAMUSCULAR | Status: DC | PRN
  Administered 2020-05-20 – 2020-05-23 (×6): 100 ug via INTRAVENOUS
  Administered 2020-05-25: 50 ug via INTRAVENOUS
  Administered 2020-05-26 (×2): 100 ug via INTRAVENOUS
  Administered 2020-05-26 – 2020-05-27 (×2): 200 ug via INTRAVENOUS
  Administered 2020-05-28 (×3): 100 ug via INTRAVENOUS
  Administered 2020-05-29: 150 ug via INTRAVENOUS
  Administered 2020-06-05 (×2): 100 ug via INTRAVENOUS
  Administered 2020-06-10: 50 ug via INTRAVENOUS
  Administered 2020-06-10: 100 ug via INTRAVENOUS
  Filled 2020-05-20: qty 4
  Filled 2020-05-20 (×12): qty 2
  Filled 2020-05-20: qty 4
  Filled 2020-05-20 (×2): qty 2
  Filled 2020-05-20: qty 4
  Filled 2020-05-20: qty 2

## 2020-05-20 MED ORDER — FUROSEMIDE 10 MG/ML IJ SOLN
60.0000 mg | Freq: Once | INTRAMUSCULAR | Status: AC
  Administered 2020-05-20: 02:00:00 60 mg via INTRAVENOUS

## 2020-05-20 MED ORDER — FENTANYL CITRATE (PF) 100 MCG/2ML IJ SOLN
50.0000 ug | INTRAMUSCULAR | Status: AC | PRN
  Administered 2020-05-20 – 2020-05-25 (×3): 50 ug via INTRAVENOUS
  Filled 2020-05-20 (×3): qty 2

## 2020-05-20 MED ORDER — DEXMEDETOMIDINE HCL IN NACL 400 MCG/100ML IV SOLN
0.4000 ug/kg/h | INTRAVENOUS | Status: DC
  Administered 2020-05-20 – 2020-05-21 (×3): 0.4 ug/kg/h via INTRAVENOUS
  Administered 2020-05-21: 0.8 ug/kg/h via INTRAVENOUS
  Filled 2020-05-20 (×5): qty 100

## 2020-05-20 MED ORDER — MIDAZOLAM HCL 2 MG/2ML IJ SOLN: 2.0000 mg | INTRAMUSCULAR | Status: DC | PRN

## 2020-05-20 MED ORDER — MIDAZOLAM HCL 2 MG/2ML IJ SOLN
2.0000 mg | INTRAMUSCULAR | Status: DC | PRN
  Administered 2020-05-30 – 2020-06-01 (×3): 2 mg via INTRAVENOUS
  Filled 2020-05-20 (×3): qty 2

## 2020-05-20 MED ORDER — CARBAMAZEPINE 100 MG PO CHEW
50.0000 mg | CHEWABLE_TABLET | Freq: Four times a day (QID) | ORAL | Status: DC
  Filled 2020-05-20 (×2): qty 0.5

## 2020-05-20 MED ORDER — ROCURONIUM BROMIDE 50 MG/5ML IV SOLN
INTRAVENOUS | Status: AC
  Filled 2020-05-20: qty 1

## 2020-05-20 MED ORDER — METOLAZONE 2.5 MG PO TABS
2.5000 mg | ORAL_TABLET | Freq: Once | ORAL | Status: AC
  Administered 2020-05-20: 2.5 mg via ORAL
  Filled 2020-05-20: qty 1

## 2020-05-20 MED ORDER — SODIUM BICARBONATE 8.4 % IV SOLN
50.0000 meq | Freq: Once | INTRAVENOUS | Status: AC
  Administered 2020-05-20: 50 meq via INTRAVENOUS

## 2020-05-20 MED ORDER — DOCUSATE SODIUM 50 MG/5ML PO LIQD
100.0000 mg | Freq: Two times a day (BID) | ORAL | Status: DC
  Administered 2020-05-20 – 2020-05-31 (×6): 100 mg via ORAL
  Filled 2020-05-20 (×7): qty 10

## 2020-05-20 MED ORDER — FENTANYL CITRATE (PF) 100 MCG/2ML IJ SOLN: 50.0000 ug | Freq: Once | INTRAMUSCULAR | Status: AC

## 2020-05-20 MED ORDER — FUROSEMIDE 10 MG/ML IJ SOLN
INTRAMUSCULAR | Status: AC
  Filled 2020-05-20: qty 6

## 2020-05-20 MED ORDER — POTASSIUM CHLORIDE 20 MEQ PO PACK
40.0000 meq | PACK | ORAL | Status: AC
  Administered 2020-05-20 (×2): 40 meq
  Filled 2020-05-20: qty 2

## 2020-05-20 MED ORDER — SODIUM BICARBONATE 8.4 % IV SOLN
50.0000 meq | Freq: Once | INTRAVENOUS | Status: AC
  Administered 2020-05-20: 50 meq via INTRAVENOUS
  Filled 2020-05-20: qty 50

## 2020-05-20 MED ORDER — FENTANYL CITRATE (PF) 100 MCG/2ML IJ SOLN
INTRAMUSCULAR | Status: AC
  Administered 2020-05-20: 50 ug via INTRAVENOUS
  Filled 2020-05-20: qty 2

## 2020-05-20 MED ORDER — FUROSEMIDE 10 MG/ML IJ SOLN
20.0000 mg | Freq: Once | INTRAMUSCULAR | Status: AC
  Administered 2020-05-20: 20 mg via INTRAVENOUS
  Filled 2020-05-20: qty 2

## 2020-05-20 MED ORDER — ETOMIDATE 2 MG/ML IV SOLN
INTRAVENOUS | Status: AC
  Filled 2020-05-20: qty 10

## 2020-05-20 MED ORDER — PANTOPRAZOLE SODIUM 40 MG IV SOLR
40.0000 mg | INTRAVENOUS | Status: DC
  Administered 2020-05-20 – 2020-06-03 (×15): 40 mg via INTRAVENOUS
  Filled 2020-05-20 (×15): qty 40

## 2020-05-20 NOTE — Progress Notes (Addendum)
CH responded to Rapid Response on 1C to support pt. and staff; pt. in bed being attended by medical team when [facility] arrived; pt. transferred to ICU; at length, CH checked on pt. status w/ICU RN --> team is working to get pt. stable.  No needs expressed at this time.  CH remains available and will inform AM chaplains of potential opportunity for follow up w/Pt.

## 2020-05-20 NOTE — Progress Notes (Signed)
Pharmacy Electrolyte Monitoring Consult:  Pharmacy consulted to assist in monitoring and replacing electrolytes in this 49 y.o. female admitted on 05/15/2020 with Code Sepsis   Labs:  Sodium (mmol/L)  Date Value  05/20/2020 147 (H)   Potassium (mmol/L)  Date Value  05/20/2020 2.4 (LL)   Magnesium (mg/dL)  Date Value  48/54/6270 2.2   Phosphorus (mg/dL)  Date Value  35/00/9381 3.0   Calcium (mg/dL)  Date Value  82/99/3716 7.6 (L)   Albumin (g/dL)  Date Value  96/78/9381 3.3 (L)    Assessment/Plan: Patient was ordered KCI 10 mEq IV x 3 and will also order PO replacement KCI 40 mEq packets x 2 and will recheck w/ am labs.  Thomasene Ripple, PharmD, BCPS Clinical Pharmacist 05/20/2020 6:23 AM
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
## 2020-05-20 NOTE — Progress Notes (Signed)
Ch attempted visit with Pt as per recommendation by Chaplain. Waters. Pt unable to communicate at this time. No family present. Ch will ask on-call chaplain to check back in on Pt later on

## 2020-05-20 NOTE — Progress Notes (Signed)
  Patient  Desaturated multiple times to lower 30s with 10L of O2 via trach. Patient's temperature was unreadable until warm blankets and bear hugger (per provider orders) were used. Respiratory, Rapid response called twice to bedside.Provider was present at bedside to assess patient. Patient was eventually transferred to the ICU per provider orders.

## 2020-05-20 NOTE — Consult Note (Signed)
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 Patient was seen by this writer 3 days ago on Monday, 05/17/20. See note from that encounter were the sacral wound, mid thoracic wound and left posterior heel wounds were assessed.  Treatment plans for those wounds are in place and Nursing has been provided guidance for topical care via the Orders.  It is noted that the patient had several episodes of decline yesterday and early this morning.  WOC Nursing was simultaneously consulted with Surgery on 05/16/20.   WOC nursing team will not follow, but will remain available to this patient, the nursing and medical teams.  Please re-consult if needed.  Thanks, Ladona Mow, MSN, RN, GNP, [facility] [place]  Pager# [phone]

## 2020-05-20 NOTE — Progress Notes (Signed)
Palliative:   Joanne [name], [name], is lying quietly in bed.  She is back on ventilator support and transferred to intensive care due to worsening saturations yesterday.  She has extensive chronic health history, and is well-known to the palliative medicine team.  She is a resident at [facility] since around March 2021.   Call to aunt, Joanne [name] at [phone], no answer.  Call to home #[phone], again no answer.  During previous conversations with family, they share that Joanne [name] wanted everything done to keep her alive.  Conference with attending, bedside nursing staff, transition of care team related to palliative consult. Palliative team to shadow.  Plan: Continue CODE STATUS discussions with family.  At this point continue full scope/full code treatment.  It would also be appropriate to first do no harm, not provide chest compressions when Joanne [name]'s heart naturally stops.        25 minutes  Joanne Carmel, NP Palliative Medicine Team  Team Phone [phone] Greater than 50% of this time was spent counseling and coordinating care related to the above assessment and plan.

## 2020-05-20 NOTE — Care Plan (Signed)
Pt was previously transferred out of ICU earlier today, with pt's SBP ranging from 80's-90's (MAP around 60).  Was contacted by Hospitalist Dr. Antionette Char as pt hypotensive on Med-Surg unit with SBP 60's-70's, despite receiving IV Fluids and Albumin.  Pt's hypotensive reading was obtained in pt's upper extremities, where she is severely contracted.  Follow up BP obtained in pt's LLE (site where consistent BP was obtained while in ICU), of which BP 91/54 (64).  Discussed with Dr. Belia Heman, he recommends to continue to obtain BP in LLE, and that he is ok with a MAP >50 as long as pt is NOT SYMPTOMATIC.  Discussed with Dr. Antionette Char, pt will remain on Med-surg unit for now, and will continue to follow BP closely.   PCCM is available as needed for any Pulmonary or Critical Care needs.     Harlon Ditty, AGACNP-BC [facility] Pager: [phone]

## 2020-05-20 NOTE — Progress Notes (Signed)
1800 Remains on Precedex for to keep respiratory rate less than 40, Levophed at 6 mcg to keep B/P MAP greater than 50. Dobb Hoff placed to give medications. Remains on ventilator via tracheostomy tube at 45%.  Unable to place PICC line today due to contractures and agitation. Guardian informed patient she was not improving and she was actually declining. Patients temperature remained sub normal even with warming blanket on all day.

## 2020-05-20 NOTE — Progress Notes (Signed)
RT X2 to patient bedside due to desaturation. Patient suctioned with little to no improvement. Patient noted to have coarse crackles bilaterally with pink tinged frothy secretions during suctioning. Rapid Response initiated. Patient bagged on 15L to assist with work of breathing. Patient SATs increased to 99%. HHFNC was placed on patient shortly after; 55L 100% FIO2. Patient SATs maintained in upper 90s. Patient bagged during transport to ICU, then placed back on HHFNC.
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
## 2020-05-20 NOTE — Significant Event (Signed)
Called to bedside for hypoxia. Patient had improved with suctioning initially but later dropped saturations to 30s-40% on 10 Lpm.   RT and rapid response at bedside. Patient being bagged, heated high flow is being setup.   She has been receiving IVF for hypotension and has diffuse rales. CXR concerning for pulm edema.   Plan to give IV Lasix now though diuresis will be complicated by hypotension and critical hypokalemia.   Discussed with PCCM, plan to transfer to ICU.   Legal guarding, [name], was reached by phone and updated on these events. She understands that Joanne [name] is critically-ill, continues to feel that the patient should be full code, and expressed appreciation for all or the staff that have been caring for Joanne [name].

## 2020-05-20 NOTE — Progress Notes (Signed)
RT X2 to patient bedside for Rapid Response. Patient SATs in mid 80s, FIO2 increased to 60%, 10L on ATC. Patient was suctioned with small to moderate return of tan/white secretions. FIO2 increased to 98%, 10L ATC with patient SAT at 92%. Will continue to monitor.

## 2020-05-20 NOTE — Progress Notes (Signed)
PROGRESS NOTE    Joanne [name]  RXV:[id number] DOB: 10-24-1970 DOA: 05/15/2020 PCP: Patient, No Pcp Per   Chief complaint.  Hypoxemia.  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 She was diagnosed with septic shock secondary to decubitus ulcer infection and osteomyelitis. She was placed on broad-spectrum antibiotics with vancomycin and cefepime. She has been seen by infectious disease and general surgery. Bedside debridement was performed. Her initial blood culture was positive for Peptostreptococcus species and Proteus mirabilis. Currently covered with vancomycin and meropenem. Patient has been evaluated by ID, patient condition is very serious, further treatment will require surgery with surgical debridement and removal of infected bones. Patient will also need a diverging colostomy.  7/28. I have discussion with patient legal guardian. Patient normally can communicate. Mental status seem to be worse since admitted to the hospital. Patient currently has significant hypotension with albumin less than 1.0. She will not be able to tolerate a surgery at this point. We will try to improve patient overall condition before considering surgery. We also discussed patient CODE STATUS, still want to be full code.  7/29.  Patient developed significant hypoxemia last night after giving additional fluids for hypotension.  Received Lasix.  Also supplement potassium for hypokalemia.    Assessment & Plan:   Principal Problem:   Severe sepsis with septic shock (HCC) Active Problems:   Acute on chronic combined systolic and diastolic CHF (congestive heart failure) (HCC)   Tracheostomy status (HCC)   Traumatic brain injury ([place])   Hypernatremia   AKI (acute kidney injury) ([place])   Hypothyroidism   Hypokalemia   Weakness   Full  code status  #1.  Acute hypoxemic respiratory failure. Appear to be secondary to acute on chronic diastolic congestive heart failure.  Most recent echocardiogram performed on 7/29 showed ejection fraction 60 to 65%.  Patient received IV Lasix.  It is very hard to balance his blood pressure and volume status.  Patient currently still on high flow oxygen over trach.  We will continue oxygen treatment.  2.  Acute on chronic diastolic congestive heart failure. Patient received IV Lasix, currently volume status is a stable, but still high.  We will give her 20 mg IV Lasix for today after potassium infusion is completed.  Monitor blood pressure closely.  3.  Hypokalemia. Patient will be receiving 50 mEq of KCl today due to persistent hypokalemia.  4.  Septic shock with septicemia secondary to Peptostreptococcus and Proteus. Infections properly treated.  Patient blood pressure still very low probably due to autonomic dysfunction in addition to severe hypoalbuminemia.  Continue midodrine as needed.  Continue balance volume status and blood pressure.  Continue antibiotics with vancomycin and meropenem.  5.  Sacral decubitus ulcer and osteomyelitis. Continue antibiotics with vancomycin and meropenem.  Given patient medical condition, patient will not survive surgical debridement with anesthesia.  6.  Severe protein calorie malnutrition. Continue Marinol to increase appetite.  7.  Hypernatremia. Patient currently volume overloaded.  We will give metolazone in addition to 20 mg of IV Lasix.  8.  Acute metabolic encephalopathy. Continue to follow.  9.  Acute kidney injury on chronic kidney disease stage III. Continue to follow.  10.  Traumatic brain injury.  11.  Chronic pain syndrome.  Patient condition is still critical.  She has very poor prognosis.  Her treatment could be  futile, however, patient POA still wants full treatment and full code.  We will continue conversation with the family as  for the goal of care.     DVT prophylaxis: Lovenox Code Status: Full Family Communication: None today  Disposition Plan:   Patient came from:                                                                                                                          Anticipated d/c place:  Barriers to d/c OR conditions which need to be met to effect a safe d/c:   Consultants:   General surgery  ID  Procedures: Bedside wound debridement. Antimicrobials: Comycin and meropenem.   Subjective: Patient still has significant hypotension, currently she is also volume overloaded.  Received IV Lasix. Currently patient is still unresponsive.  She is on high flow oxygen over trach.  She is comfortable on oxygen. She does not seem to have any pain or nausea vomiting. No fever or chills.  Objective: Vitals:   05/20/20 1030 05/20/20 1215 05/20/20 1230 05/20/20 1245  BP: (!) 86/66 (!) 81/56 (!) 73/47 (!) 75/50  Pulse:      Resp: (!) 24 (!) 24 (!) 0 23  Temp: (!) 94.8 F (34.9 C) (!) 95 F (35 C) (!) 95 F (35 C) (!) 95 F (35 C)  TempSrc:      SpO2:      Weight:      Height:        Intake/Output Summary (Last 24 hours) at 05/20/2020 1259 Last data filed at 05/20/2020 0931 Gross per 24 hour  Intake 651.23 ml  Output 825 ml  Net -173.77 ml   Filed Weights   05/15/20 1320 05/16/20 1420 05/20/20 0230  Weight: 63.5 kg 61.3 kg 66.3 kg    Examination:  General exam: Appears comfortable, chronically ill Respiratory system: Crackles in the base bilaterally. Respiratory effort normal. Cardiovascular system: Regular with some tachycardia.  No JVD, murmurs, rubs, gallops or clicks. No pedal edema. Gastrointestinal system: Abdomen is nondistended, soft and nontender.  Normal bowel sounds heard. Central nervous system: Unresponsive Extremities: symmetric      Data Reviewed: I have personally reviewed following labs and imaging studies  CBC: Recent Labs  Lab  05/15/20 1329 05/16/20 0510 05/17/20 0519 05/18/20 0657 05/19/20 0409 05/20/20 0019 05/20/20 1013  WBC 22.1*   < > 21.9* 21.2* 20.2* 17.0* 10.8*  NEUTROABS 19.5*  --   --   --   --   --   --   HGB 12.8   < > 9.2* 9.3* 8.7* 7.3* 7.3*  HCT 42.0   < > 28.2* 28.6* 25.2* 22.4* 21.8*  MCV 91.7   < > 88.4 87.5 83.4 88.5 86.2  PLT 273   < > 163 155 120* 123* 97*   < > = values in this interval not displayed.   Basic Metabolic Panel: Recent Labs  Lab 05/16/20 0510 05/16/20 1716 05/18/20 0657 05/19/20 0409 05/20/20  0019 05/20/20 0415 05/20/20 1013  NA 158*   < > 150* 151* 147* 151* 150*  K 2.4*   < > 3.1* 3.9 2.4* 3.0* 2.4*  CL 129*   < > 126* 127* 118* 125* 123*  CO2 21*   < > 17* 14* 17* 11* 18*  GLUCOSE 183*   < > 112* 117* 118* 119* 108*  BUN 37*   < > _0 CREATININE 1.12*   < > 0.89 0.78 1.02* 0.98 1.09*  CALCIUM 6.9*   < > 7.4* 7.3* 7.6* 7.7* 7.7*  MG 1.8  --  1.5* 1.6* 2.2  --  2.3  PHOS 1.9*  --   --   --  3.0  --  3.5   < > = values in this interval not displayed.   GFR: Estimated Creatinine Clearance: 57.8 mL/min (A) (by C-G formula based on SCr of 1.09 mg/dL (H)). Liver Function Tests: Recent Labs  Lab 05/15/20 1329 05/16/20 0510 05/18/20 0657 05/19/20 0409 05/20/20 0019  AST 38 _1 ALT _2 ALKPHOS 172* 115 104 116 142*  BILITOT 0.8 0.7 0.8 0.7 0.9  PROT 8.6* 5.8* 5.3* 4.8* 6.3*  ALBUMIN 1.5* <1.0* 1.1* <1.0* 3.3*   No results for input(s): LIPASE, AMYLASE in the last 168 hours. No results for input(s): AMMONIA in the last 168 hours. Coagulation Profile: No results for input(s): INR, PROTIME in the last 168 hours. Cardiac Enzymes: No results for input(s): CKTOTAL, CKMB, CKMBINDEX, TROPONINI in the last 168 hours. BNP (last 3 results) No results for input(s): PROBNP in the last 8760 hours. HbA1C: No results for input(s): HGBA1C in the last 72 hours. CBG: Recent Labs  Lab 05/16/20 1422 05/20/20 0205  GLUCAP 86 114*    Lipid Profile: No results for input(s): CHOL, HDL, LDLCALC, TRIG, CHOLHDL, LDLDIRECT in the last 72 hours. Thyroid Function Tests: No results for input(s): TSH, T4TOTAL, FREET4, T3FREE, THYROIDAB in the last 72 hours. Anemia Panel: No results for input(s): VITAMINB12, FOLATE, FERRITIN, TIBC, IRON, RETICCTPCT in the last 72 hours. Sepsis Labs: Recent Labs  Lab 05/16/20 0510 05/16/20 1716 05/17/20 0519 05/17/20 0927 05/18/20 0047 05/18/20 0657 05/20/20 0019 05/20/20 1013  PROCALCITON 0.47  --  0.37  --   --  0.28 0.37  --   LATICACIDVEN 3.1*   < >  --  2.7* 3.7*  --  2.0* 1.4   < > = values in this interval not displayed.    Recent Results (from the past 240 hour(s))  Blood Culture (routine x 2)     Status: None   Collection Time: 05/15/20  1:29 PM   Specimen: BLOOD  Result Value Ref Range Status   Specimen Description BLOOD R ARM  Final   Special Requests   Final    BOTTLES DRAWN AEROBIC AND ANAEROBIC Blood Culture results may not be optimal due to an excessive volume of blood received in culture bottles   Culture   Final    NO GROWTH 5 DAYS Performed at [facility], [address] [place], [place] [address]    Report Status 05/20/2020 FINAL  Final  Blood Culture (routine x 2)     Status: Abnormal   Collection Time: 05/15/20  1:29 PM   Specimen: BLOOD  Result Value Ref Range Status   Specimen Description   Final    BLOOD L AC Performed at [facility], [address] [place],  [place] [address]    Special Requests   Final    BOTTLES DRAWN AEROBIC AND ANAEROBIC Blood Culture results may not be optimal due to an inadequate volume of blood received in culture bottles Performed at [facility], [address] [place], [place] [address]    Culture  Setup Time   Final    Organism ID to follow [place] CRITICAL RESULT CALLED TO, READ BACK BY AND VERIFIED WITH: MYRA SLAUGHTER ON 05/16/2020 AT 1605  TIK GRAM NEGATIVE RODS AEROBIC BOTTLE ONLY CRITICAL RESULT CALLED TO, READ BACK BY AND VERIFIED WITH: DAVID BASANTI 05/18/20 0520 SJL/RWW Performed at [facility], [address] [place], [place] [address]    Culture PEPTOSTREPTOCOCCUS SPECIES PROTEUS MIRABILIS  (A)  Final   Report Status 05/20/2020 FINAL  Final   Organism ID, Bacteria PROTEUS MIRABILIS  Final      Susceptibility   Proteus mirabilis - MIC*    AMPICILLIN <=2 SENSITIVE Sensitive     CEFAZOLIN 8 SENSITIVE Sensitive     CEFEPIME <=0.12 SENSITIVE Sensitive     CEFTAZIDIME <=1 SENSITIVE Sensitive     CEFTRIAXONE <=0.25 SENSITIVE Sensitive     CIPROFLOXACIN >=4 RESISTANT Resistant     GENTAMICIN <=1 SENSITIVE Sensitive     IMIPENEM 4 SENSITIVE Sensitive     TRIMETH/SULFA 80 RESISTANT Resistant     AMPICILLIN/SULBACTAM <=2 SENSITIVE Sensitive     PIP/TAZO <=4 SENSITIVE Sensitive     * PROTEUS MIRABILIS  Blood Culture ID Panel (Reflexed)     Status: None   Collection Time: 05/15/20  1:29 PM  Result Value Ref Range Status   Enterococcus species NOT DETECTED NOT DETECTED Final   Listeria monocytogenes NOT DETECTED NOT DETECTED Final   Staphylococcus species NOT DETECTED NOT DETECTED Final   Staphylococcus aureus (BCID) NOT DETECTED NOT DETECTED Final   Streptococcus species NOT DETECTED NOT DETECTED Final   Streptococcus agalactiae NOT DETECTED NOT DETECTED Final   Streptococcus pneumoniae NOT DETECTED NOT DETECTED Final   Streptococcus pyogenes NOT DETECTED NOT DETECTED Final   Acinetobacter baumannii NOT DETECTED NOT DETECTED Final   Enterobacteriaceae species NOT DETECTED NOT DETECTED Final   Enterobacter cloacae complex NOT DETECTED NOT DETECTED Final   Escherichia coli NOT DETECTED NOT DETECTED Final   Klebsiella oxytoca NOT DETECTED NOT DETECTED Final   Klebsiella pneumoniae NOT DETECTED NOT DETECTED Final   Proteus species NOT DETECTED NOT DETECTED Final   Serratia marcescens NOT DETECTED NOT  DETECTED Final   Haemophilus influenzae NOT DETECTED NOT DETECTED Final   Neisseria meningitidis NOT DETECTED NOT DETECTED Final   Pseudomonas aeruginosa NOT DETECTED NOT DETECTED Final   Candida albicans NOT DETECTED NOT DETECTED Final   Candida glabrata NOT DETECTED NOT DETECTED Final   Candida krusei NOT DETECTED NOT DETECTED Final   Candida parapsilosis NOT DETECTED NOT DETECTED Final   Candida tropicalis NOT DETECTED NOT DETECTED Final    Comment: Performed at [facility], [address] [place], [place] [address]  Blood Culture ID Panel (Reflexed)     Status: Abnormal   Collection Time: 05/15/20  1:29 PM  Result Value Ref Range Status   Enterococcus species NOT DETECTED NOT DETECTED Final   Listeria monocytogenes NOT DETECTED NOT DETECTED Final   Staphylococcus species NOT DETECTED NOT DETECTED Final   Staphylococcus aureus (BCID) NOT DETECTED NOT DETECTED Final   Streptococcus species NOT DETECTED NOT DETECTED Final   Streptococcus agalactiae NOT DETECTED NOT DETECTED Final  Streptococcus pneumoniae NOT DETECTED NOT DETECTED Final   Streptococcus pyogenes NOT DETECTED NOT DETECTED Final   Acinetobacter baumannii NOT DETECTED NOT DETECTED Final   Enterobacteriaceae species DETECTED (A) NOT DETECTED Final    Comment: Enterobacteriaceae represent a large family of gram-negative bacteria, not a single organism. CRITICAL RESULT CALLED TO, READ BACK BY AND VERIFIED WITH: DAVID BASANTI 05/18/20 0520 SJL    Enterobacter cloacae complex NOT DETECTED NOT DETECTED Final   Escherichia coli NOT DETECTED NOT DETECTED Final   Klebsiella oxytoca NOT DETECTED NOT DETECTED Final   Klebsiella pneumoniae NOT DETECTED NOT DETECTED Final   Proteus species DETECTED (A) NOT DETECTED Final    Comment: CRITICAL RESULT CALLED TO, READ BACK BY AND VERIFIED WITH: DAVID BASANTI 05/18/20 0520 SJL    Serratia marcescens NOT DETECTED NOT DETECTED Final   Carbapenem resistance NOT DETECTED NOT  DETECTED Final   Haemophilus influenzae NOT DETECTED NOT DETECTED Final   Neisseria meningitidis NOT DETECTED NOT DETECTED Final   Pseudomonas aeruginosa NOT DETECTED NOT DETECTED Final   Candida albicans NOT DETECTED NOT DETECTED Final   Candida glabrata NOT DETECTED NOT DETECTED Final   Candida krusei NOT DETECTED NOT DETECTED Final   Candida parapsilosis NOT DETECTED NOT DETECTED Final   Candida tropicalis NOT DETECTED NOT DETECTED Final    Comment: Performed at [facility], [address] [place], [place] [address]  Urine culture     Status: Abnormal   Collection Time: 05/15/20  2:03 PM   Specimen: In/Out Cath Urine  Result Value Ref Range Status   Specimen Description   Final    IN/OUT CATH URINE Performed at [facility], [address] [place], [place] [address]    Special Requests   Final    NONE Performed at [facility], [address] [place], [place] [address]    Culture (A)  Final    <10,000 COLONIES/mL INSIGNIFICANT GROWTH Performed at [facility], [place] [address] [place], [place] [address]    Report Status 05/16/2020 FINAL  Final  SARS Coronavirus 2 by RT PCR (hospital order, performed in [facility] hospital lab) Nasopharyngeal Nasopharyngeal Swab     Status: None   Collection Time: 05/16/20 12:07 AM   Specimen: Nasopharyngeal Swab  Result Value Ref Range Status   SARS Coronavirus 2 NEGATIVE NEGATIVE Final    Comment: (NOTE) SARS-CoV-2 target nucleic acids are NOT DETECTED.  The SARS-CoV-2 RNA is generally detectable in upper and lower respiratory specimens during the acute phase of infection. The lowest concentration of SARS-CoV-2 viral copies this assay can detect is 250 copies / mL. A negative result does not preclude SARS-CoV-2 infection and should not be used as the sole basis for treatment or other patient management decisions.  A negative result may occur with improper specimen collection / handling, submission  of specimen other than nasopharyngeal swab, presence of viral mutation(s) within the areas targeted by this assay, and inadequate number of viral copies (<250 copies / mL). A negative result must be combined with clinical observations, patient history, and epidemiological information.  Fact Sheet for Patients:   StrictlyIdeas.no  Fact Sheet for Healthcare Providers: BankingDealers.co.za  This test is not yet approved or  cleared by the [place] FDA and has been authorized for detection and/or diagnosis of SARS-CoV-2 by FDA under an Emergency Use Authorization (EUA).  This EUA will remain in effect (meaning this test can be used) for the duration of the COVID-19 declaration under Section 564(b)(1)  of the Act, 21 U.S.C. section 360bbb-3(b)(1), unless the authorization is terminated or revoked sooner.  Performed at [facility], [address] [place], [place] [address]   MRSA PCR Screening     Status: None   Collection Time: 05/16/20  2:37 PM   Specimen: Nasopharyngeal  Result Value Ref Range Status   MRSA by PCR NEGATIVE NEGATIVE Final    Comment:        The GeneXpert MRSA Assay (FDA approved for NASAL specimens only), is one component of a comprehensive MRSA colonization surveillance program. It is not intended to diagnose MRSA infection nor to guide or monitor treatment for MRSA infections. Performed at [facility] [address] [place], [place] [address]   CULTURE, BLOOD (ROUTINE X 2) w Reflex to ID Panel     Status: None (Preliminary result)   Collection Time: 05/18/20 12:46 AM   Specimen: BLOOD  Result Value Ref Range Status   Specimen Description BLOOD RIGHT HAND  Final   Special Requests BOTTLES DRAWN AEROBIC AND ANAEROBIC BCAV  Final   Culture   Final    NO GROWTH 2 DAYS Performed at [facility] [address] [place], [place] [address]    Report Status PENDING  Incomplete   CULTURE, BLOOD (ROUTINE X 2) w Reflex to ID Panel     Status: None (Preliminary result)   Collection Time: 05/18/20  6:57 AM   Specimen: BLOOD  Result Value Ref Range Status   Specimen Description BLOOD RIGHT HAND  Final   Special Requests   Final    BOTTLES DRAWN AEROBIC ONLY Blood Culture adequate volume   Culture   Final    NO GROWTH 2 DAYS Performed at [facility], [address] [place], [place] [address]    Report Status PENDING  Incomplete  Culture, respiratory     Status: None (Preliminary result)   Collection Time: 05/20/20  3:09 AM   Specimen: Tracheal Aspirate; Respiratory  Result Value Ref Range Status   Specimen Description   Final    TRACHEAL ASPIRATE Performed at [facility], [address] [place], [place] [address]    Special Requests   Final    NONE Performed at [facility], [address] [place], [place] [address]    Gram Stain   Final    RARE WBC PRESENT, PREDOMINANTLY MONONUCLEAR NO ORGANISMS SEEN Performed at [facility], [place] [address], [place], [place] [address]    Culture PENDING  Incomplete   Report Status PENDING  Incomplete         Radiology Studies: DG Neck Soft Tissue  Result Date: 05/20/2020 CLINICAL DATA:  Tracheostomy. EXAM: NECK SOFT TISSUES - 1+ VIEW COMPARISON:  Chest x-rays same day. FINDINGS: True lateral view not obtained possibly related to prominent cervicothoracic scoliosis. Cervical ankylosis also appears to be present suggesting ankylosing spondylitis. No acute bony abnormality. Tracheostomy appears to be in good anatomic position. Prominent air noted of the pharyngeal region on lateral view, this may be related to positioning. Clinical correlation is suggested. Epiglottis appears unremarkable. No retropharyngeal soft tissue swelling noted. No radiopaque foreign body. IMPRESSION: 1. True lateral view not obtained possibly related to prominent cervicothoracic spine scoliosis. Cervical  ankylosis also appears to be present suggesting ankylosing spondylitis. No acute bony abnormality identified 2.  Tracheostomy appears to be in good anatomic position. 3. Prominent air noted in the pharyngeal region on lateral view, this may be related to positioning. Clinical correlation  suggested. 4.  No soft tissue swelling or radiopaque foreign body noted. Electronically Signed   By: Marcello Moores  Register   On: 05/20/2020 05:43   DG Chest Port 1 View  Result Date: 05/20/2020 CLINICAL DATA:  Tracheostomy. EXAM: PORTABLE CHEST 1 VIEW COMPARISON:  05/20/2020. FINDINGS: Tracheostomy noted in good anatomic position. Heart size stable. Bilateral pulmonary infiltrates/edema again noted with interim improvement in aeration. Interim improvement in bilateral pleural effusions. Prominent thoracic spine scratched it prominent thoracolumbar spine scoliosis and degenerative change. Surgical clips right upper quadrant. IMPRESSION: 1.  Tracheostomy tube noted good anatomic position. 2. Prominent bilateral pulmonary infiltrates/edema again noted with interim improvement in aeration. Interim improvement of bilateral pleural effusions. Electronically Signed   By: Marcello Moores  Register   On: 05/20/2020 05:38   DG Chest Port 1 View  Result Date: 05/20/2020 CLINICAL DATA:  Acute on chronic respiratory failure EXAM: PORTABLE CHEST 1 VIEW COMPARISON:  05/15/2020 FINDINGS: Tracheostomy tube is noted in place. Cardiac shadow is stable. The inspiratory effort is poor with small effusions bilaterally as well as central vascular congestion and patchy parenchymal infiltrates. No acute rib abnormality is seen. Stable scoliosis is noted. IMPRESSION: Changes of CHF with some parenchymal infiltrates likely related to edema although multifocal pneumonia cannot be excluded. Electronically Signed   By: Inez Catalina M.D.   On: 05/20/2020 00:51   [place] EKG SITE RITE  Result Date: 05/20/2020 If Site Rite image not attached, placement could not be  confirmed due to current cardiac rhythm.       Scheduled Meds: . carbamazepine  100 mg Oral BID  . chlorhexidine  15 mL Mouth Rinse BID  . Chlorhexidine Gluconate Cloth  6 each Topical Daily  . collagenase   Topical Daily  . docusate  100 mg Oral BID  . dronabinol  2.5 mg Oral BID AC  . enoxaparin (LOVENOX) injection  40 mg Subcutaneous Q24H  . furosemide      . hydrocortisone sod succinate (SOLU-CORTEF) inj  50 mg Intravenous Q6H  . levothyroxine  50 mcg Intravenous Q0600  . mouth rinse  15 mL Mouth Rinse q12n4p  . midodrine  10 mg Oral TID WC  . pantoprazole (PROTONIX) IV  40 mg Intravenous Q24H  . polyethylene glycol  17 g Oral Daily  . sodium chloride flush  10-40 mL Intracatheter Q12H   Continuous Infusions: . sodium chloride 1,000 mL (05/16/20 0826)  . sodium chloride 250 mL (05/19/20 2252)  . dexmedetomidine (PRECEDEX) IV infusion 0.6 mcg/kg/hr (05/20/20 0600)  . norepinephrine (LEVOPHED) Adult infusion 5 mcg/min (05/20/20 1244)  . piperacillin-tazobactam (ZOSYN)  IV 3.375 g (05/20/20 0700)  . potassium chloride 10 mEq (05/20/20 1249)     LOS: 5 days    Time spent: 36 minutes    Sharen Hones, MD [facility]   To contact the attending provider between 7A-7P or the covering provider during after hours 7P-7A, please log into the web site [email or web address] and access using universal [place] password for that web site. If you do not have the password, please call the hospital operator.  05/20/2020, 12:59 PM

## 2020-05-20 NOTE — Progress Notes (Signed)
PICC RN assessed patient for PICC placement. Patient right arm contracted, unable to extend.  Patient left arm very difficult to extend.  MD paged and notified of findings. PICC RN recommended central line be placed or PICC placed through IR.

## 2020-05-20 NOTE — Progress Notes (Signed)
CH responded to Rapid Response on 1C; when [facility] arrived medical team attending to pt.; pt. is known to [facility] from prior visits from previous admission; CH offered silent prayer in rm. for medical team and for pt.'s sense of divine presence.  CH remains available as needed.

## 2020-05-20 NOTE — Consult Note (Addendum)
Name: Joanne [name] MRN: [id number] DOB: 1971-08-23    ADMISSION DATE:  05/15/2020 CONSULTATION DATE: 05/20/2020  REFERRING MD : Dr. Myna Hidalgo   CHIEF COMPLAINT: Hypoxia, Hypotension   BRIEF PATIENT DESCRIPTION:  49yo F with TBI , trache status, seizure disorder as well as multiple additional comorbid conditions came in with profound hypotension noted to have a very large sacral decubitus down to the bone with what appears to be bony destruction and osteomyelitis.  PCCM was consulted for management of septic shock likely secondary to large sacral decubitus with vasopressor requirement.  SIGNIFICANT EVENTS/STUDIES:  07/23: Pt admitted to stepdown unit with sepsis but remained housed in the ER pending bed availability  07/24: CT Pelvis revealed large sacral decubitus ulcer which has progressed significantly since prior study. Extension to the underlying sacrum and coccyx with evidence of osteomyelitis and bone destruction. No evidence of fluid collection or abscess. Stable midline ventral hernia as without evidence of bowel obstruction or strangulation. 07/24: Pt with hypotension PCCM team consulted to assist with management  7/26: General Surgery consulted, performed bedside debridement 7/26: Palliative Care consulted, pt's POA requests pt remain Full Code with aggressive medical interventions 7/27: ID consulted due to Proteus & peptostreptococcus  Bacteremia 7/27: 2D Echocardiogram: Left Ventricle: Left ventricular ejection fraction, by estimation, is 60  to 65%. The left ventricle has normal function. The left ventricle has no  regional wall motion abnormalities. The left ventricular internal cavity  size was normal in size. There is  mild left ventricular hypertrophy. Left ventricular diastolic parameters  are indeterminate.  7/27: Weaned off Vasopressors 7/28: Transfer out to Med-Surg unit 7/29: RR called due to Hypoxia, suctioning produced tan/white secretions; transfer to ICU; PCCM  reconsulted  HISTORY OF PRESENT ILLNESS:   This is a 49 yo female with a PMH of Traumatic Brain Injury, Chronic Tracheostomy, Seizure Disorder, Klebsiella Pneumonia, Hypothyroidism, Type II Diabetes Mellitus, ARDS, Chronic Combined Systolic and Diastolic CHF, and Acute Renal Failure. She presented to [facility] ER on 07/25 via EMS from [facility] with hypotension and tachycardia.  Per ER notes EMS reported when they arrived at the scene the pt was febrile and they were unable to obtain an accurate O2 sats.  Pt arrived to the ER with NRB in place.  Lab results revealed Na+ 166, K+ 2.7, chloride 124, glucose 133, BUN 52, creatinine 1.56, calcium 8.5, anion gap 16, alk phos 172, albumin 1.5, lactic acid 5.1, wbc 22.1, possible UTI, COVID-19 negative, and CXR negative.  CT Pelvis revealed significantly progressing large sacral decubitus ulcer and extension to the underlying sacrum and coccyx with evidence of osteomyelitis and bone destruction.  She received 3L NS bolus, 1L LR bolus, zosyn, and K+ supplementation.  Pt admitted to the stepdown unit per hospitalist team for additional workup and treatment.  Detailed hospital course outlined above under significant events/studies   PAST MEDICAL HISTORY :   has a past medical history of Acute kidney injury (AKI) with acute tubular necrosis (ATN) ([place]), Acute on chronic combined systolic and diastolic CHF (congestive heart failure) ([place]) (06/06/2018), Acute on chronic respiratory failure with hypoxia ([place]) (06/06/2018), ARDS (adult respiratory distress syndrome) ([place]) (06/06/2018), Aspiration pneumonia due to gastric secretions ([place]), Diabetes mellitus ([place]), History of traumatic brain injury, Hypothyroidism, Pneumonia due to Klebsiella pneumoniae ([place]) (06/06/2018), Seizure disorder ([place]), Severe sepsis ([place]) (06/06/2018), Tracheostomy status ([place]) (06/06/2018), and Traumatic brain injury ([place]).  has a past surgical history that includes Tracheostomy. Prior to Admission  medications   Medication Sig Start  Date End Date Taking? Authorizing Provider  acetaminophen (TYLENOL) 325 MG tablet Take 2 tablets (650 mg total) by mouth every 6 (six) hours as needed for mild pain or fever (or Fever >/= 101). 11/20/19  Yes Wyvonnia Dusky, MD  aspirin 81 MG EC tablet aspirin 81 mg tablet,delayed release  Take 1 tablet every day by oral route.   Yes [provider]  B Complex-C-E-Zn (B COMPLEX-C-E-ZINC) tablet Take by mouth.   Yes [provider]  carbamazepine (TEGRETOL XR) 100 MG 12 hr tablet Take 100 mg by mouth 2 (two) times daily.   Yes [provider]  cetirizine (ZYRTEC) 10 MG tablet Take by mouth.   Yes [provider]  cyanocobalamin 50 MCG tablet Take by mouth.   Yes [provider]  famotidine (PEPCID) 20 MG tablet Take 20 mg by mouth 2 (two) times daily.   Yes [provider]  ferrous sulfate 325 (65 FE) MG tablet Take 325 mg by mouth daily.   Yes [provider]  folic acid (FOLVITE) 1 MG tablet Take 1 mg by mouth daily. 07/28/19  Yes [provider]  levothyroxine (SYNTHROID) 100 MCG tablet Take 100 mcg by mouth daily. 07/18/19  Yes [provider]  loratadine (CLARITIN) 10 MG tablet Take 20 mg by mouth daily.   Yes [provider]  midodrine (PROAMATINE) 5 MG tablet Take 5 mg by mouth 3 (three) times daily with meals.  05/06/20  Yes [provider]  MULTIPLE VITAMIN PO Take 1 tablet by mouth daily.   Yes [provider]  Neomy-Bacit-Polymyx-Pramoxine 1 % OINT Apply topically. Apply to (L) Lower buttock/At fold typically every evening shift for wound. Cover with gauze/conversite; Change QD 'til healed.   Yes [provider]  oxyCODONE (OXY IR/ROXICODONE) 5 MG immediate release tablet Take 1 tablet (5 mg total) by mouth every 4 (four) hours as needed for moderate pain. 03/16/20  Yes Sreenath, Sudheer B, MD  polyethylene glycol powder (GLYCOLAX/MIRALAX)  17 GM/SCOOP powder Take 17 g by mouth daily.   Yes [provider]  Polyvinyl Alcohol-Povidone PF 1.4-0.6 % SOLN Place 1 drop into both eyes 3 (three) times daily.   Yes [provider]  SANTYL ointment Apply topically. 05/12/20  Yes [provider]  tamsulosin (FLOMAX) 0.4 MG CAPS capsule Take 0.4 mg by mouth at bedtime.  06/16/19  Yes [provider]  traMADol (ULTRAM) 50 MG tablet Take 25 mg by mouth 3 (three) times daily. 08/05/19  Yes [provider]   Allergies  Allergen Reactions  . Kaopectate  [Attapulgite] Itching  . Thiopental Itching  . Tomato     FAMILY HISTORY:  Family history is unknown by patient. SOCIAL HISTORY:  reports that she has never smoked. She has never used smokeless tobacco. She reports previous alcohol use. She reports previous drug use.  REVIEW OF SYSTEMS:   Unable to obtain due to TBI with cognitive impairment  SUBJECTIVE:  Unable to obtain due to TBI with cognitive impairment  VITAL SIGNS: Temp:  [97.3 F (36.3 C)-97.7 F (36.5 C)] 97.5 F (36.4 C) (07/28 1811) Pulse Rate:  [72-93] 85 (07/29 0007) Resp:  [8-33] 24 (07/28 2006) BP: (66-98)/(39-59) 89/58 (07/29 0007) SpO2:  [91 %-100 %] 91 % (07/29 0046) FiO2 (%):  [28 %-98 %] 98 % (07/29 0046)  PHYSICAL EXAMINATION: General: Acute on chronically ill-appearing female status post TBI, on 99% heated high flow via tracheostomy, with moderate respiratory distress  Neuro: Right paraplegic,  able to follow some simple commands to the left upper and lower extremities  HEENT: Atraumatic, normocephalic, neck supple, no JVD Cardiovascular: Regular rate and rhythm, S1-S2, no murmurs, rubs, gallops Lungs: Diffuse coarse Rales upon auscultation, no wheezing, tachypnea, with mild accessory muscle use Abdomen: Soft, nondistended, no guarding or rebound tenderness, bowel sounds positive x4 Musculoskeletal: Right paraplegic Skin: Large sacral decubitus with bony  destruction of the sacral spine present on admission   Recent Labs  Lab 05/18/20 0657 05/19/20 0409 05/20/20 0019  NA 150* 151* 147*  K 3.1* 3.9 2.4*  CL 126* 127* 118*  CO2 17* 14* 17*  BUN _0 CREATININE 0.89 0.78 1.02*  GLUCOSE 112* 117* 118*   Recent Labs  Lab 05/18/20 0657 05/19/20 0409 05/20/20 0019  HGB 9.3* 8.7* 7.3*  HCT 28.6* 25.2* 22.4*  WBC 21.2* 20.2* 17.0*  PLT 155 120* 123*   DG Chest Port 1 View  Result Date: 05/20/2020 CLINICAL DATA:  Acute on chronic respiratory failure EXAM: PORTABLE CHEST 1 VIEW COMPARISON:  05/15/2020 FINDINGS: Tracheostomy tube is noted in place. Cardiac shadow is stable. The inspiratory effort is poor with small effusions bilaterally as well as central vascular congestion and patchy parenchymal infiltrates. No acute rib abnormality is seen. Stable scoliosis is noted. IMPRESSION: Changes of CHF with some parenchymal infiltrates likely related to edema although multifocal pneumonia cannot be excluded. Electronically Signed   By: Inez Catalina M.D.   On: 05/20/2020 00:51   ECHOCARDIOGRAM COMPLETE  Result Date: 05/19/2020    ECHOCARDIOGRAM REPORT   Patient Name:   Joanne [name] Date of Exam: 05/18/2020 Medical Rec #:  [id number]       Height:       64.0 in Accession #:    [id number]      Weight:       135.1 lb Date of Birth:  01-29-1971       BSA:          1.656 m Patient Age:    50 years        BP:           105/57 mmHg Patient Gender: F               HR:           96 bpm. Exam Location:  [facility] Procedure: 2D Echo, Color Doppler and Cardiac Doppler Indications:     Bacteremia 790.7  History:         Patient has prior history of Echocardiogram examinations, most                  recent 03/08/2020. Risk Factors:Diabetes.  Sonographer:     Sherrie Sport RDCS (AE) Referring Phys:  [id number] ERIC J [place] Diagnosing Phys: Ida Rogue MD  Sonographer Comments: Technically challenging study due to limited acoustic windows, no apical window and no  subcostal window. Patient is on trach breathing tube and is non-verbal. IMPRESSIONS  1. Left ventricular ejection fraction, by estimation, is 60 to 65%. The left ventricle has normal function. The left ventricle has no regional wall motion abnormalities. There is mild left ventricular hypertrophy. Left ventricular diastolic parameters are indeterminate.  2. Right ventricular systolic function is normal. The right ventricular size is normal.  3. No vegetation noted.  4. Challenging images. FINDINGS  Left Ventricle: Left ventricular ejection fraction, by estimation, is 60 to 65%. The left ventricle has normal function. The left ventricle has no regional wall motion abnormalities. The  left ventricular internal cavity size was normal in size. There is  mild left ventricular hypertrophy. Left ventricular diastolic parameters are indeterminate. Right Ventricle: The right ventricular size is normal. No increase in right ventricular wall thickness. Right ventricular systolic function is normal. Left Atrium: Left atrial size was normal in size. Right Atrium: Right atrial size was normal in size. Pericardium: There is no evidence of pericardial effusion. Mitral Valve: The mitral valve is normal in structure. Normal mobility of the mitral valve leaflets. No evidence of mitral valve regurgitation. No evidence of mitral valve stenosis. Tricuspid Valve: The tricuspid valve is normal in structure. Tricuspid valve regurgitation is not demonstrated. No evidence of tricuspid stenosis. Aortic Valve: The aortic valve was not well visualized. Aortic valve regurgitation is not visualized. No aortic stenosis is present. Pulmonic Valve: The pulmonic valve was normal in structure. Pulmonic valve regurgitation is not visualized. No evidence of pulmonic stenosis. Aorta: The aortic root is normal in size and structure. Venous: The inferior vena cava is normal in size with greater than 50% respiratory variability, suggesting right atrial  pressure of 3 mmHg. IAS/Shunts: No atrial level shunt detected by color flow Doppler.  LEFT VENTRICLE PLAX 2D LVIDd:         3.20 cm LVIDs:         1.77 cm LV PW:         0.97 cm LV IVS:        1.03 cm LVOT diam:     1.90 cm LVOT Area:     2.84 cm  LEFT ATRIUM         Index LA diam:    2.30 cm 1.39 cm/m                        PULMONIC VALVE AORTA                 PV Vmax:        0.90 m/s Ao Root diam: 2.30 cm PV Peak grad:   3.3 mmHg                       RVOT Peak grad: 3 mmHg   SHUNTS Systemic Diam: 1.90 cm Ida Rogue MD Electronically signed by Ida Rogue MD Signature Date/Time: 05/19/2020/6:46:48 AM    Final     Media Information   Document Information  Photos    05/16/2020 17:05  Attached To:  Hospital Encounter on 05/15/20  Source Information          ASSESSMENT / PLAN:  Acute Hypoxic Respiratory Failure secondary to Pulmonary Edema -Supplemental O2 as needed to maintain O2 saturations >92% -Follow intermittent CXR & ABG as needed -CXR 7/29 concerning for pulmonary edema vs. Multifocal pna -To receive 60 mg IV Lasix x1 dose 05/20/20 -Will check Procalcitonin and Tracheal aspirate -May need uncuffed trach exchanged to cuffed shiley if requires mechanical ventilation  Acute Decompensated combined Systolic & Diastolic CHF -Continuous cardiac monitoring -Maintain MAP >60 -IV Lasix as BP and renal function permits -To receive 60 mg IV Lasix x1 dose 7/29 -Continue Midodrine -Vasopressors if needed to maintain MAP goal  Severe Sepsis due to Proteus bacteremia and peptostreptococcus in setting of Osteomyelitis and Stage IV Sacral Decubitus -Monitor fever curve  -Trend WBC's & Procalcitonin -Follow cultures as above -ID following, appreciate input -Antibiotics as per ID, currently on Zosyn   5.9cm sacral decubitus with bony destruction of sacral spine -Surgery consulted, performed bedside  debridement on 05/17/20 -Wound care -Continue Antibiotics -Will need surgical  removal of infected bone, given current condition, will not likely survive the surgery  AKI on CKD Stage II>> improving Hypernatremia>>improving Hypokalemia -Monitor I&O's / urinary output -Follow BMP -Ensure adequate renal perfusion -Avoid nephrotoxic agents as able -Replace electrolytes as indicated -Pharmacy consulted for assistance in electrolyte replacement  Anemia -Monitor for S/Sx of bleeding -Trend CBC -SQ Lovenox for VTE Prophylaxis  -Transfuse for Hgb <7  Hypothyroidism -Continue Synthroid  TBI w/ Seizure disorder -Continue Tegretol              Pt is critically ill.  Prognosis is guarded.  High risk for cardiac arrest and death.  Recommend DNR/DNI status.    BEST PRACTICES DISPOSITION: ICU GOALS OF CARE: full code VTE PROPHYLAXIS: Lovenox SQ CONSULTS: ID, General Surgery UPDATES:  Pt's legal guardian Reginold Agent updated by Dr. Myna Hidalgo prior to transfer to ICU 05/20/20    Darel Hong, AGACNP-BC [place] Pager: [phone]
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
## 2020-05-20 NOTE — Progress Notes (Signed)
Was called to bedside as pt with desaturations again requiring BMV ventilation by Respiratory Therapy.  Pt noted to have tachypnea (RR 30's) with increased work of breathing and assessory muscle use.  Given work of breathing, pt to be placed on ventilator.  Cuffless Shiley exchanged to Cuffed Shiley by Respiratory Therapy.  Pt with appropriate color change on CO2 detector, and bilateral breath sounds with bilateral chest rise.  Orders placed for CXR and neck X-ray to verify placement.      Harlon Ditty, AGACNP-BC [facility] Pager: [phone]

## 2020-05-20 NOTE — Progress Notes (Signed)
SLP Cancellation Note  Patient Details Name: NIKIESHA MILFORD MRN: [id number] DOB: 1971/04/28   Cancelled treatment:       Reason Eval/Treat Not Completed: Medical issues which prohibited therapy;Patient not medically ready (chart reviewed; consulted NSG then MD) .  Per MD/chart notes, pt had persistent hypotension yesterday PM despite IVF and albumin followed by low O2 SATs w/ FIO2 100% and 15L on ATC. D/t continued respiratory decline, pt's trach was changed to accommodate being placed on vent which followed. Pt remains in CCU on vent support currently. MD to place and NG/dobhoff for nutrition support post discussion secondary to the increased effort on pt's part, concern for dysphagia/aspiration, and decreased oral intake reported in the last 2 days(wounds). ST services will sign off at this time w/ MD/NSG to reconsult when pt is appropriate for ST services again. Recommend frequent oral care d/t pt's declined dentition status.    Jerilynn Som, MS, CCC-SLP Adel Neyer 05/20/2020, 11:47 AM

## 2020-05-21 DIAGNOSIS — A419 Sepsis, unspecified organism: Secondary | ICD-10-CM | POA: Diagnosis not present

## 2020-05-21 LAB — COMPREHENSIVE METABOLIC PANEL
ALT: 17 U/L (ref 0–44)
AST: 23 U/L (ref 15–41)
Albumin: 3 g/dL — ABNORMAL LOW (ref 3.5–5.0)
Alkaline Phosphatase: 116 U/L (ref 38–126)
Anion gap: 9 (ref 5–15)
BUN: 20 mg/dL (ref 6–20)
CO2: 17 mmol/L — ABNORMAL LOW (ref 22–32)
Calcium: 7.7 mg/dL — ABNORMAL LOW (ref 8.9–10.3)
Chloride: 123 mmol/L — ABNORMAL HIGH (ref 98–111)
Creatinine, Ser: 1.22 mg/dL — ABNORMAL HIGH (ref 0.44–1.00)
GFR calc Af Amer: 60 mL/min — ABNORMAL LOW (ref >60)
GFR calc non Af Amer: 52 mL/min — ABNORMAL LOW (ref >60)
Glucose, Bld: 139 mg/dL — ABNORMAL HIGH (ref 70–99)
Potassium: 3.3 mmol/L — ABNORMAL LOW (ref 3.5–5.1)
Sodium: 149 mmol/L — ABNORMAL HIGH (ref 135–145)
Total Bilirubin: 1.4 mg/dL — ABNORMAL HIGH (ref 0.3–1.2)
Total Protein: 5.9 g/dL — ABNORMAL LOW (ref 6.5–8.1)

## 2020-05-21 LAB — GLUCOSE, CAPILLARY
Glucose-Capillary: 150 mg/dL — ABNORMAL HIGH (ref 70–99)
Glucose-Capillary: 220 mg/dL — ABNORMAL HIGH (ref 70–99)
Glucose-Capillary: 241 mg/dL — ABNORMAL HIGH (ref 70–99)
Glucose-Capillary: 258 mg/dL — ABNORMAL HIGH (ref 70–99)

## 2020-05-21 LAB — CBC
HCT: 24 % — ABNORMAL LOW (ref 36.0–46.0)
Hemoglobin: 8.3 g/dL — ABNORMAL LOW (ref 12.0–15.0)
MCH: 28.8 pg (ref 26.0–34.0)
MCHC: 34.6 g/dL (ref 30.0–36.0)
MCV: 83.3 fL (ref 80.0–100.0)
Platelets: 114 10*3/uL — ABNORMAL LOW (ref 150–400)
RBC: 2.88 MIL/uL — ABNORMAL LOW (ref 3.87–5.11)
RDW: 19 % — ABNORMAL HIGH (ref 11.5–15.5)
WBC: 22.2 10*3/uL — ABNORMAL HIGH (ref 4.0–10.5)
nRBC: 0.2 % (ref 0.0–0.2)

## 2020-05-21 LAB — MAGNESIUM: Magnesium: 2.3 mg/dL (ref 1.7–2.4)

## 2020-05-21 MED ORDER — VITAL HIGH PROTEIN PO LIQD: 1000.0000 mL | ORAL | Status: DC

## 2020-05-21 MED ORDER — CHLORHEXIDINE GLUCONATE 0.12% ORAL RINSE (MEDLINE KIT)
15.0000 mL | Freq: Two times a day (BID) | OROMUCOSAL | Status: DC
  Administered 2020-05-21 – 2020-06-10 (×41): 15 mL via OROMUCOSAL

## 2020-05-21 MED ORDER — ORAL CARE MOUTH RINSE
15.0000 mL | OROMUCOSAL | Status: DC
  Administered 2020-05-21 – 2020-06-10 (×186): 15 mL via OROMUCOSAL

## 2020-05-21 MED ORDER — FREE WATER
200.0000 mL | Status: DC
  Administered 2020-05-21 – 2020-06-10 (×117): 200 mL

## 2020-05-21 MED ORDER — SODIUM BICARBONATE-DEXTROSE 150-5 MEQ/L-% IV SOLN
150.0000 meq | INTRAVENOUS | Status: DC
  Administered 2020-05-21 – 2020-05-22 (×4): 150 meq via INTRAVENOUS
  Filled 2020-05-21 (×5): qty 1000

## 2020-05-21 MED ORDER — NOREPINEPHRINE 16 MG/250ML-% IV SOLN
0.0000 ug/min | INTRAVENOUS | Status: DC
  Administered 2020-05-21: 10 ug/min via INTRAVENOUS
  Administered 2020-05-22: 6 ug/min via INTRAVENOUS
  Filled 2020-05-21 (×2): qty 250

## 2020-05-21 MED ORDER — INSULIN ASPART 100 UNIT/ML ~~LOC~~ SOLN
0.0000 [IU] | SUBCUTANEOUS | Status: DC
  Administered 2020-05-21: 3 [IU] via SUBCUTANEOUS
  Administered 2020-05-21: 5 [IU] via SUBCUTANEOUS
  Administered 2020-05-22 (×2): 2 [IU] via SUBCUTANEOUS
  Administered 2020-05-22: 3 [IU] via SUBCUTANEOUS
  Administered 2020-05-22 (×2): 2 [IU] via SUBCUTANEOUS
  Administered 2020-05-22 – 2020-05-23 (×2): 3 [IU] via SUBCUTANEOUS
  Administered 2020-05-23 (×2): 1 [IU] via SUBCUTANEOUS
  Administered 2020-05-23: 2 [IU] via SUBCUTANEOUS
  Administered 2020-05-23 – 2020-05-24 (×2): 1 [IU] via SUBCUTANEOUS
  Administered 2020-05-24: 3 [IU] via SUBCUTANEOUS
  Administered 2020-05-24: 4 [IU] via SUBCUTANEOUS
  Administered 2020-05-24 – 2020-05-25 (×4): 1 [IU] via SUBCUTANEOUS
  Administered 2020-05-25: 2 [IU] via SUBCUTANEOUS
  Administered 2020-05-25 – 2020-06-07 (×6): 1 [IU] via SUBCUTANEOUS
  Filled 2020-05-21 (×27): qty 1

## 2020-05-21 MED ORDER — POTASSIUM CHLORIDE 20 MEQ PO PACK
40.0000 meq | PACK | Freq: Once | ORAL | Status: AC
  Administered 2020-05-21: 40 meq via ORAL
  Filled 2020-05-21: qty 2

## 2020-05-21 MED ORDER — VITAL 1.5 CAL PO LIQD
1000.0000 mL | ORAL | Status: DC
  Administered 2020-05-21 – 2020-05-30 (×7): 1000 mL

## 2020-05-21 MED ORDER — POTASSIUM CHLORIDE 10 MEQ/100ML IV SOLN
10.0000 meq | INTRAVENOUS | Status: AC
  Administered 2020-05-21 (×2): 10 meq via INTRAVENOUS
  Filled 2020-05-21 (×2): qty 100

## 2020-05-21 MED ORDER — CARBAMAZEPINE 100 MG PO CHEW
50.0000 mg | CHEWABLE_TABLET | Freq: Four times a day (QID) | ORAL | Status: DC
  Administered 2020-05-21 – 2020-06-10 (×77): 50 mg
  Filled 2020-05-21 (×87): qty 0.5

## 2020-05-21 MED ORDER — POTASSIUM CHLORIDE 20 MEQ PO PACK
40.0000 meq | PACK | Freq: Once | ORAL | Status: AC
  Administered 2020-05-21: 40 meq
  Filled 2020-05-21: qty 2

## 2020-05-21 MED ORDER — PROSOURCE TF PO LIQD
45.0000 mL | Freq: Every day | ORAL | Status: DC
  Administered 2020-05-21 – 2020-06-01 (×10): 45 mL
  Filled 2020-05-21 (×12): qty 45

## 2020-05-21 NOTE — Procedures (Addendum)
Central Venous Catheter Insertion Procedure Note  Joanne [name]  [id number]  1970-10-26  Date:05/21/20  Time:12:34 AM   Provider Performing:Joanne [name] D Joanne [name]   Procedure: Insertion of Non-tunneled Central Venous Catheter(36556) with US guidance (10272)   Indication(s) Medication administration and Difficult access  Consent Unable to obtain consent due to emergent nature of procedure.  Anesthesia Topical only with 1% lidocaine   Timeout Verified patient identification, verified procedure, site/side was marked, verified correct patient position, special equipment/implants available, medications/allergies/relevant history reviewed, required imaging and test results available.  Sterile Technique Maximal sterile technique including full sterile barrier drape, hand hygiene, sterile gown, sterile gloves, mask, hair covering, sterile ultrasound probe cover (if used).  Procedure Description Area of catheter insertion was cleaned with chlorhexidine and draped in sterile fashion.  With real-time ultrasound guidance a central venous catheter was placed into the left femoral vein. Nonpulsatile blood flow and easy flushing noted in all ports.  The catheter was sutured in place and sterile dressing applied.  Complications/Tolerance None; patient tolerated the procedure well. Chest X-ray is ordered to verify placement for internal jugular or subclavian cannulation.   Chest x-ray is not ordered for femoral cannulation.  EBL Minimal  Specimen(s) None    Joanne [name], AGACNP-BC [facility] Pager: [phone]

## 2020-05-21 NOTE — Procedures (Addendum)
Arterial Catheter Insertion Procedure Note  SHAIMA SARDINAS  [id number]  01-Sep-1971  Date:05/21/20  Time:12:35 AM    Provider Performing: Judithe Modest    Procedure: Insertion of Arterial Line (48270) with US guidance (78675)   Indication(s) Blood pressure monitoring and/or need for frequent ABGs  Consent Unable to obtain consent due to emergent nature of procedure.  Anesthesia None   Time Out Verified patient identification, verified procedure, site/side was marked, verified correct patient position, special equipment/implants available, medications/allergies/relevant history reviewed, required imaging and test results available.   Sterile Technique Maximal sterile technique including full sterile barrier drape, hand hygiene, sterile gown, sterile gloves, mask, hair covering, sterile ultrasound probe cover (if used).   Procedure Description Area of catheter insertion was cleaned with chlorhexidine and draped in sterile fashion. With real-time ultrasound guidance an arterial catheter was placed into the left femoral artery.  Appropriate arterial tracings confirmed on monitor.     Complications/Tolerance None; patient tolerated the procedure well.   EBL Minimal   Specimen(s) None    Harlon Ditty, AGACNP-BC [facility] Pager: [phone]

## 2020-05-21 NOTE — Plan of Care (Signed)
Pt continued on vent support  of PC 45/5/20/30, pt will become tachypneic with any activities. Required fentanyl Prn to calm her down several time last night. Pt  have new left femoral art line and new femoral triple lumen CVC placed last night. Pt still on precedex gtt  weaned down to 0.4 do to decrease heart rate. and levo gtt weaned down to , pt is still in out of Afib. Adequate urine output in last 12hrs.

## 2020-05-21 NOTE — Progress Notes (Signed)
Pharmacy Electrolyte Monitoring Consult:  Pharmacy consulted to assist in monitoring and replacing electrolytes in this 49 y.o. female admitted on 05/15/2020  Labs:  Sodium (mmol/L)  Date Value  05/21/2020 149 (H)   Potassium (mmol/L)  Date Value  05/21/2020 3.3 (L)   Magnesium (mg/dL)  Date Value  01/00/7121 2.3   Phosphorus (mg/dL)  Date Value  97/58/8325 3.5   Calcium (mg/dL)  Date Value  49/82/6415 7.7 (L)   Albumin (g/dL)  Date Value  83/06/4075 3.57 (L)    49 year old female with h/o TBI, seizures, hypothyroidism, diabetes. Patient admitted to ICU and subsequently transferred to the floor. Rapid response overnight for hypoxia and patient readmitted to the ICU. Blood cultures with peptostreptococcus and proteus. 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  Assessment/Plan: Electrolytes have improved. Patient to start tube feeds today, will be at risk for refeeding syndrome. Patient received potassium 10 mEq IV x 2 doses. I added 40 mEq per tube once. Per rounds, patient started on sodium bicarb infusion, which I expect to decrease potassium. Will add an additional 40 mEq per tube this afternoon.  All electrolytes with morning labs.  Laureen Ochs, PharmD Clinical Pharmacist 05/21/2020 1:38 PM

## 2020-05-21 NOTE — Progress Notes (Signed)
Name: Joanne [name] MRN: [id number] DOB: Mar 13, 1971    ADMISSION DATE:  05/15/2020 CONSULTATION DATE: 05/20/2020  REFERRING MD : Dr. Myna Hidalgo   CHIEF COMPLAINT: Hypoxia, Hypotension   BRIEF PATIENT DESCRIPTION:  49yo F with TBI , trach status, seizure disorder as well as multiple additional comorbid conditions admitted with Septic Shock due to Proteus bacteremia and peptostreptococcus in setting of Stage IV Sacral Decubitus with Osteomyelitis.  SIGNIFICANT EVENTS/STUDIES:  07/23: Pt admitted to stepdown unit with sepsis but remained housed in the ER pending bed availability  07/24: CT Pelvis revealed large sacral decubitus ulcer which has progressed significantly since prior study. Extension to the underlying sacrum and coccyx with evidence of osteomyelitis and bone destruction. No evidence of fluid collection or abscess. Stable midline ventral hernia as without evidence of bowel obstruction or strangulation. 07/24: Pt with hypotension PCCM team consulted to assist with management  7/26: General Surgery consulted, performed bedside debridement 7/26: Palliative Care consulted, pt's POA requests pt remain Full Code with aggressive medical interventions 7/27: ID consulted due to Proteus & peptostreptococcus  Bacteremia 7/27: 2D Echocardiogram: Left Ventricle: Left ventricular ejection fraction, by estimation, is 60  to 65%. The left ventricle has normal function. The left ventricle has no  regional wall motion abnormalities. The left ventricular internal cavity  size was normal in size. There is  mild left ventricular hypertrophy. Left ventricular diastolic parameters  are indeterminate.  7/27: Weaned off Vasopressors 7/28: Transfer out to Med-Surg unit 7/29: RR called due to Hypoxia in setting of pulmonary edema; transfer to ICU; PCCM reconsulted 7/29: Cuffless shiley exchanged for Cuffed shiley, placed on vent; Hypotensive requiring vasopressors 7/30: Left Femoral CVC & Left Femoral  A-line placed  HISTORY OF PRESENT ILLNESS:   This is a 49 yo female with a PMH of Traumatic Brain Injury, Chronic Tracheostomy, Seizure Disorder, Klebsiella Pneumonia, Hypothyroidism, Type II Diabetes Mellitus, ARDS, Chronic Combined Systolic and Diastolic CHF, and Acute Renal Failure. She presented to [facility] ER on 07/25 via EMS from [facility] with hypotension and tachycardia.  Per ER notes EMS reported when they arrived at the scene the pt was febrile and they were unable to obtain an accurate O2 sats.  Pt arrived to the ER with NRB in place.  Lab results revealed Na+ 166, K+ 2.7, chloride 124, glucose 133, BUN 52, creatinine 1.56, calcium 8.5, anion gap 16, alk phos 172, albumin 1.5, lactic acid 5.1, wbc 22.1, possible UTI, COVID-19 negative, and CXR negative.  CT Pelvis revealed significantly progressing large sacral decubitus ulcer and extension to the underlying sacrum and coccyx with evidence of osteomyelitis and bone destruction.  She received 3L NS bolus, 1L LR bolus, zosyn, and K+ supplementation.  Pt admitted to the stepdown unit per hospitalist team for additional workup and treatment.  Detailed hospital course outlined above under significant events/studies   PAST MEDICAL HISTORY :   has a past medical history of Acute kidney injury (AKI) with acute tubular necrosis (ATN) ([place]), Acute on chronic combined systolic and diastolic CHF (congestive heart failure) ([place]) (06/06/2018), Acute on chronic respiratory failure with hypoxia ([place]) (06/06/2018), ARDS (adult respiratory distress syndrome) ([place]) (06/06/2018), Aspiration pneumonia due to gastric secretions ([place]), Diabetes mellitus ([place]), History of traumatic brain injury, Hypothyroidism, Pneumonia due to Klebsiella pneumoniae ([place]) (06/06/2018), Seizure disorder ([place]), Severe sepsis ([place]) (06/06/2018), Tracheostomy status ([place]) (06/06/2018), and Traumatic brain injury ([place]).  has a past surgical history that includes Tracheostomy. Prior to  Admission medications   Medication Sig Start Date End  Date Taking? Authorizing Provider  acetaminophen (TYLENOL) 325 MG tablet Take 2 tablets (650 mg total) by mouth every 6 (six) hours as needed for mild pain or fever (or Fever >/= 101). 11/20/19  Yes Wyvonnia Dusky, MD  aspirin 81 MG EC tablet aspirin 81 mg tablet,delayed release  Take 1 tablet every day by oral route.   Yes [provider]  B Complex-C-E-Zn (B COMPLEX-C-E-ZINC) tablet Take by mouth.   Yes [provider]  carbamazepine (TEGRETOL XR) 100 MG 12 hr tablet Take 100 mg by mouth 2 (two) times daily.   Yes [provider]  cetirizine (ZYRTEC) 10 MG tablet Take by mouth.   Yes [provider]  cyanocobalamin 50 MCG tablet Take by mouth.   Yes [provider]  famotidine (PEPCID) 20 MG tablet Take 20 mg by mouth 2 (two) times daily.   Yes [provider]  ferrous sulfate 325 (65 FE) MG tablet Take 325 mg by mouth daily.   Yes [provider]  folic acid (FOLVITE) 1 MG tablet Take 1 mg by mouth daily. 07/28/19  Yes [provider]  levothyroxine (SYNTHROID) 100 MCG tablet Take 100 mcg by mouth daily. 07/18/19  Yes [provider]  loratadine (CLARITIN) 10 MG tablet Take 20 mg by mouth daily.   Yes [provider]  midodrine (PROAMATINE) 5 MG tablet Take 5 mg by mouth 3 (three) times daily with meals.  05/06/20  Yes [provider]  MULTIPLE VITAMIN PO Take 1 tablet by mouth daily.   Yes [provider]  Neomy-Bacit-Polymyx-Pramoxine 1 % OINT Apply topically. Apply to (L) Lower buttock/At fold typically every evening shift for wound. Cover with gauze/conversite; Change QD 'til healed.   Yes [provider]  oxyCODONE (OXY IR/ROXICODONE) 5 MG immediate release tablet Take 1 tablet (5 mg total) by mouth every 4 (four) hours as needed for moderate pain. 03/16/20  Yes Sreenath, Sudheer B, MD  polyethylene glycol powder  (GLYCOLAX/MIRALAX) 17 GM/SCOOP powder Take 17 g by mouth daily.   Yes [provider]  Polyvinyl Alcohol-Povidone PF 1.4-0.6 % SOLN Place 1 drop into both eyes 3 (three) times daily.   Yes [provider]  SANTYL ointment Apply topically. 05/12/20  Yes [provider]  tamsulosin (FLOMAX) 0.4 MG CAPS capsule Take 0.4 mg by mouth at bedtime.  06/16/19  Yes [provider]  traMADol (ULTRAM) 50 MG tablet Take 25 mg by mouth 3 (three) times daily. 08/05/19  Yes [provider]   Allergies  Allergen Reactions  . Kaopectate  [Attapulgite] Itching  . Thiopental Itching  . Tomato     FAMILY HISTORY:  Family history is unknown by patient. SOCIAL HISTORY:  reports that she has never smoked. She has never used smokeless tobacco. She reports previous alcohol use. She reports previous drug use.  REVIEW OF SYSTEMS:   Unable to obtain due to sedation and TBI with cognitive impairment   SUBJECTIVE:  Unable to obtain due to sedation and TBI with cognitive impairment  Currently on 6 mcg Levophed  VITAL SIGNS: Temp:  [92.3 F (33.5 C)-96.6 F (35.9 C)] 95.7 F (35.4 C) (07/29 2300) Pulse Rate:  [57-95] 57 (07/29 1615) Resp:  [0-41] 38 (07/29 2300) BP: (73-109)/(47-80) 98/73 (07/29 2300) SpO2:  [91 %-100 %] 95 % (07/29 2300) FiO2 (%):  [45 %-100 %] 45 % (07/29 2300) Weight:  [66.3 kg] 66.3 kg (07/29 0230)  PHYSICAL EXAMINATION: General: Acute on chronically ill-appearing female status  post TBI, lightly sedated with Precedex, on mechanical ventilation in no acute distress Neuro: Sedated on Precedex (nable to follow commands due to sedation), right paraplegic  HEENT: Atraumatic, normocephalic, neck supple, no JVD, pupils PERRLA  Cardiovascular: Bradycardia, regular rhythm, S1-S2, no murmurs, rubs, gallops  Lungs: Coarse breath sounds throughout upon auscultation, no wheezing, vent assisted, even Abdomen: Soft, nondistended, no guarding or rebound  tenderness, bowel sounds positive x4  Musculoskeletal: Right paraplegic Skin: Large sacral decubitus with bony destruction of the sacral spine present on admission   Recent Labs  Lab 05/20/20 0019 05/20/20 0019 05/20/20 0415 05/20/20 1013 05/20/20 2147  NA 147*  --  151* 150*  --   K 2.4*   < > 3.0* 2.4* 4.2  CL 118*  --  125* 123*  --   CO2 17*  --  11* 18*  --   BUN 18  --  18 19  --   CREATININE 1.02*  --  0.98 1.09*  --   GLUCOSE 118*  --  119* 108*  --    < > = values in this interval not displayed.   Recent Labs  Lab 05/19/20 0409 05/20/20 0019 05/20/20 1013  HGB 8.7* 7.3* 7.3*  HCT 25.2* 22.4* 21.8*  WBC 20.2* 17.0* 10.8*  PLT 120* 123* 97*   DG Neck Soft Tissue  Result Date: 05/20/2020 CLINICAL DATA:  Tracheostomy. EXAM: NECK SOFT TISSUES - 1+ VIEW COMPARISON:  Chest x-rays same day. FINDINGS: True lateral view not obtained possibly related to prominent cervicothoracic scoliosis. Cervical ankylosis also appears to be present suggesting ankylosing spondylitis. No acute bony abnormality. Tracheostomy appears to be in good anatomic position. Prominent air noted of the pharyngeal region on lateral view, this may be related to positioning. Clinical correlation is suggested. Epiglottis appears unremarkable. No retropharyngeal soft tissue swelling noted. No radiopaque foreign body. IMPRESSION: 1. True lateral view not obtained possibly related to prominent cervicothoracic spine scoliosis. Cervical ankylosis also appears to be present suggesting ankylosing spondylitis. No acute bony abnormality identified 2.  Tracheostomy appears to be in good anatomic position. 3. Prominent air noted in the pharyngeal region on lateral view, this may be related to positioning. Clinical correlation suggested. 4.  No soft tissue swelling or radiopaque foreign body noted. Electronically Signed   By: Marcello Moores  Register   On: 05/20/2020 05:43   DG Abd 1 View  Result Date: 05/20/2020 CLINICAL DATA:   Orogastric tube placement. EXAM: ABDOMEN - 1 VIEW COMPARISON:  None. FINDINGS: A nasogastric tube is seen with its weighted tip overlying the lateral aspect of the right upper quadrant. Given patient positioning this is likely within the region of the gastric antrum versus proximal duodenum. The bowel gas pattern is normal. No radio-opaque calculi or other significant radiographic abnormality are seen. Radiopaque surgical clips are seen within the right upper quadrant. There is marked severity levoscoliosis of the lumbar spine. Radiopaque fixation screws are seen within the proximal left femur. IMPRESSION: Nasogastric tube positioning as described above. Electronically Signed   By: Virgina Norfolk M.D.   On: 05/20/2020 13:33   DG Chest Port 1 View  Result Date: 05/20/2020 CLINICAL DATA:  Tracheostomy. EXAM: PORTABLE CHEST 1 VIEW COMPARISON:  05/20/2020. FINDINGS: Tracheostomy noted in good anatomic position. Heart size stable. Bilateral pulmonary infiltrates/edema again noted with interim improvement in aeration. Interim improvement in bilateral pleural effusions. Prominent thoracic spine scratched it prominent thoracolumbar spine scoliosis and degenerative change. Surgical clips right upper quadrant. IMPRESSION: 1.  Tracheostomy tube noted  good anatomic position. 2. Prominent bilateral pulmonary infiltrates/edema again noted with interim improvement in aeration. Interim improvement of bilateral pleural effusions. Electronically Signed   By: Marcello Moores  Register   On: 05/20/2020 05:38   DG Chest Port 1 View  Result Date: 05/20/2020 CLINICAL DATA:  Acute on chronic respiratory failure EXAM: PORTABLE CHEST 1 VIEW COMPARISON:  05/15/2020 FINDINGS: Tracheostomy tube is noted in place. Cardiac shadow is stable. The inspiratory effort is poor with small effusions bilaterally as well as central vascular congestion and patchy parenchymal infiltrates. No acute rib abnormality is seen. Stable scoliosis is noted.  IMPRESSION: Changes of CHF with some parenchymal infiltrates likely related to edema although multifocal pneumonia cannot be excluded. Electronically Signed   By: Inez Catalina M.D.   On: 05/20/2020 00:51   [place] EKG SITE RITE  Result Date: 05/20/2020 If Site Rite image not attached, placement could not be confirmed due to current cardiac rhythm.   Media Information   Document Information  Photos    05/16/2020 17:05  Attached To:  Hospital Encounter on 05/15/20  Source Information        ASSESSMENT / PLAN:   Acute Hypoxic Respiratory Failure secondary to Pulmonary Edema -Full vent support -Wean FiO2 and PEEP as tolerated to maintain O2 saturations greater than 92% -Follow intermittent chest x-ray and ABG as needed -VAP protocol -Spontaneous breathing trials when respiratory parameters met -Lasix as blood pressure and renal function permits   Septic shock Acute Decompensated Diastolic CHF -Continuous cardiac monitoring -Maintain MAP greater than 65 -Cautious IV fluids given pulmonary edema -Vasopressors as needed to maintain MAP greater than 65 -Continue midodrine -Lasix as blood pressure and renal function permits -Echocardiogram on 05/18/2020 with EF 60 to 79%, diastolic parameters indeterminate   Severe Sepsis due to Proteus bacteremia and peptostreptococcus, in setting of Stage IV Sacral Decubitus with Osteomyelitis -Monitor fever curve -Trend WBCs and procalcitonin -Follow cultures as above -ID following, appreciate input -Antibiotics as per ID, currently on Zosyn -Surgery consulted, performed bedside debridement on 05/17/2020 -Wound care -Continue antibiotic -Will need surgical removal of infected bone, given current condition, will likely not survive surgery   AKI on CKD Stage II>> improving Hypernatremia (likely hypotonic hypervolemic in setting of volume overload) Hypokalemia -Monitor I&O's / urinary output -Follow BMP -Ensure adequate renal  perfusion -Avoid nephrotoxic agents as able -Replace electrolytes as indicated -Pharmacy consulted for assistance and electrolyte replacement -Volume removal as able   Anemia Thrombocytopenia, likely in setting of sepsis -Monitor for S/Sx of bleeding -Trend CBC -SCD's for VTE Prophylaxis  -Transfuse for Hgb <7 -Transfuse Platelets for Platelet count <50 with active bleeding   Hypothyroidism -Continue Synthroid   TBI w/ Seizure disorder -Continue Tegretol        Pt is critically ill.  Prognosis is guarded.  High risk for cardiac arrest and death.  Recommend DNR/DNI status, and comfort care measures.    BEST PRACTICES DISPOSITION: ICU GOALS OF CARE: full code VTE PROPHYLAXIS: SCD's CONSULTS: ID, General Surgery UPDATES: No family at bedside during NP rounds 05/21/20    Darel Hong, AGACNP-BC [facility] Pager: [phone]

## 2020-05-21 NOTE — Progress Notes (Signed)
[name] INFECTIOUS DISEASE PROGRESS NOTE Date of Admission:  05/15/2020     ID: Joanne [name] is a 49 y.o. female with septic shock Principal Problem:   Severe sepsis with septic shock ([place]) Active Problems:   Acute on chronic combined systolic and diastolic CHF (congestive heart failure) (HCC)   Tracheostomy status (HCC)   Traumatic brain injury (Joanne [name])   Hypernatremia   AKI (acute kidney injury) ([place])   Hypothyroidism   Hypokalemia   Weakness   Full code status   Protein-calorie malnutrition, severe   Subjective: Remains in ICU, hypotensive on pressor, on vent through trach, hypothermic, on warmer. WBC up to 22 from 17.  ROS  Unable to obtain  Medications:  Antibiotics Given (last 72 hours)    Date/Time Action Medication Dose Rate   05/18/20 1006 New Bag/Given   vancomycin (VANCOREADY) IVPB 750 mg/150 mL 750 mg 150 mL/hr   05/18/20 1455 New Bag/Given   meropenem (MERREM) 1 g in sodium chloride 0.9 % 100 mL IVPB 1 g 200 mL/hr   05/18/20 2210 New Bag/Given   meropenem (MERREM) 1 g in sodium chloride 0.9 % 100 mL IVPB 1 g 200 mL/hr   05/18/20 2301 New Bag/Given   vancomycin (VANCOREADY) IVPB 750 mg/150 mL 750 mg 150 mL/hr   05/19/20 0550 New Bag/Given   meropenem (MERREM) 1 g in sodium chloride 0.9 % 100 mL IVPB 1 g 200 mL/hr   05/19/20 1003 New Bag/Given   vancomycin (VANCOREADY) IVPB 750 mg/150 mL 750 mg 150 mL/hr   05/19/20 2310 New Bag/Given   piperacillin-tazobactam (ZOSYN) IVPB 3.375 g 3.375 g 12.5 mL/hr   05/20/20 0700 New Bag/Given   piperacillin-tazobactam (ZOSYN) IVPB 3.375 g 3.375 g 12.5 mL/hr   05/20/20 1814 New Bag/Given   piperacillin-tazobactam (ZOSYN) IVPB 3.375 g 3.375 g 12.5 mL/hr   05/21/20 0523 New Bag/Given   piperacillin-tazobactam (ZOSYN) IVPB 3.375 g 3.375 g 12.5 mL/hr      chlorhexidine gluconate (MEDLINE KIT)  15 mL Mouth Rinse BID   Chlorhexidine Gluconate Cloth  6 each Topical Daily   collagenase   Topical Daily   docusate   100 mg Oral BID   dronabinol  2.5 mg Oral BID AC   hydrocortisone sod succinate (SOLU-CORTEF) inj  50 mg Intravenous Q6H   levothyroxine  50 mcg Intravenous Q0600   mouth rinse  15 mL Mouth Rinse 10 times per day   midodrine  10 mg Oral TID WC   pantoprazole (PROTONIX) IV  40 mg Intravenous Q24H   polyethylene glycol  17 g Oral Daily   potassium chloride  40 mEq Per Tube Once   sodium chloride flush  10-40 mL Intracatheter Q12H    Objective: Vital signs in last 24 hours: Temp:  [92.3 F (33.5 C)-97.5 F (36.4 C)] 97.5 F (36.4 C) (07/30 0600) Pulse Rate:  [50-76] 56 (07/30 0600) Resp:  [0-41] 31 (07/30 0600) BP: (73-111)/(47-80) 109/52 (07/30 0600) SpO2:  [93 %-99 %] 99 % (07/30 0600) Arterial Line BP: (85-135)/(36-64) 118/53 (07/30 0600) FiO2 (%):  [21 %-45 %] 21 % (07/30 0600) Weight:  [67.1 kg] 67.1 kg (07/30 0600) Physical Exam  Constitutional: non responsive, trach  HENT: [place]/AT,  Mouth/Throat: Oropharynx is dry Cardiovascular: Loletha Grayer Pulmonary/Chest: mech breath sounds Neck = supple, no nuchal rigidity Abdominal: Soft.  Neurological: no responsive. Skin: sacral wound covered. Photos reviewed  Lab Results Recent Labs    05/20/20 1013 05/20/20 1013 05/20/20 2147 05/21/20 0532  WBC 10.8*  --   --  22.2*  HGB 7.3*  --   --  8.3*  HCT 21.8*  --   --  24.0*  NA 150*  --   --  149*  K 2.4*   < > 4.2 3.3*  CL 123*  --   --  123*  CO2 18*  --   --  17*  BUN 19  --   --  20  CREATININE 1.09*  --   --  1.22*   < > = values in this interval not displayed.    Microbiology: @micro @ Studies/Results: DG Neck Soft Tissue  Result Date: 05/20/2020 CLINICAL DATA:  Tracheostomy. EXAM: NECK SOFT TISSUES - 1+ VIEW COMPARISON:  Chest x-rays same day. FINDINGS: True lateral view not obtained possibly related to prominent cervicothoracic scoliosis. Cervical ankylosis also appears to be present suggesting ankylosing spondylitis. No acute bony abnormality. Tracheostomy  appears to be in good anatomic position. Prominent air noted of the pharyngeal region on lateral view, this may be related to positioning. Clinical correlation is suggested. Epiglottis appears unremarkable. No retropharyngeal soft tissue swelling noted. No radiopaque foreign body. IMPRESSION: 1. True lateral view not obtained possibly related to prominent cervicothoracic spine scoliosis. Cervical ankylosis also appears to be present suggesting ankylosing spondylitis. No acute bony abnormality identified 2.  Tracheostomy appears to be in good anatomic position. 3. Prominent air noted in the pharyngeal region on lateral view, this may be related to positioning. Clinical correlation suggested. 4.  No soft tissue swelling or radiopaque foreign body noted. Electronically Signed   By: Marcello Moores  Register   On: 05/20/2020 05:43   DG Abd 1 View  Result Date: 05/20/2020 CLINICAL DATA:  Orogastric tube placement. EXAM: ABDOMEN - 1 VIEW COMPARISON:  None. FINDINGS: A nasogastric tube is seen with its weighted tip overlying the lateral aspect of the right upper quadrant. Given patient positioning this is likely within the region of the gastric antrum versus proximal duodenum. The bowel gas pattern is normal. No radio-opaque calculi or other significant radiographic abnormality are seen. Radiopaque surgical clips are seen within the right upper quadrant. There is marked severity levoscoliosis of the lumbar spine. Radiopaque fixation screws are seen within the proximal left femur. IMPRESSION: Nasogastric tube positioning as described above. Electronically Signed   By: Virgina Norfolk M.D.   On: 05/20/2020 13:33   DG Chest Port 1 View  Result Date: 05/20/2020 CLINICAL DATA:  Tracheostomy. EXAM: PORTABLE CHEST 1 VIEW COMPARISON:  05/20/2020. FINDINGS: Tracheostomy noted in good anatomic position. Heart size stable. Bilateral pulmonary infiltrates/edema again noted with interim improvement in aeration. Interim improvement in  bilateral pleural effusions. Prominent thoracic spine scratched it prominent thoracolumbar spine scoliosis and degenerative change. Surgical clips right upper quadrant. IMPRESSION: 1.  Tracheostomy tube noted good anatomic position. 2. Prominent bilateral pulmonary infiltrates/edema again noted with interim improvement in aeration. Interim improvement of bilateral pleural effusions. Electronically Signed   By: Marcello Moores  Register   On: 05/20/2020 05:38   DG Chest Port 1 View  Result Date: 05/20/2020 CLINICAL DATA:  Acute on chronic respiratory failure EXAM: PORTABLE CHEST 1 VIEW COMPARISON:  05/15/2020 FINDINGS: Tracheostomy tube is noted in place. Cardiac shadow is stable. The inspiratory effort is poor with small effusions bilaterally as well as central vascular congestion and patchy parenchymal infiltrates. No acute rib abnormality is seen. Stable scoliosis is noted. IMPRESSION: Changes of CHF with some parenchymal infiltrates likely related to edema although multifocal pneumonia cannot be excluded. Electronically Signed   By: Linus Mako.D.  On: 05/20/2020 00:51   [place] EKG SITE RITE  Result Date: 05/20/2020 If Site Rite image not attached, placement could not be confirmed due to current cardiac rhythm.   Assessment/Plan: Severe Sepsis due to Proteus bacteremiaand peptostreptococcus, in setting of Stage IV Sacral Decubitus with Osteomyelitis, s/p bedside debridement on 05/17/2020 7/30 On pressor, wbc up to 22, hypothermic Current abx zosyn -  On review of prior culture data back to Jan 2021 all organisms S to zosyn Giving increasing wbc, will repeat bcx. Can consider adding MRSA coverage if worsens pending culture results. Thank you very much for the consult. Will follow with you.  Leonel Ramsay   05/21/2020, 8:11 AM

## 2020-05-21 NOTE — Progress Notes (Signed)
HgbA1C  drawn this morning 0532 and received at 0856. Results pending, will result tomorrow.

## 2020-05-21 NOTE — Consult Note (Signed)
Pharmacy Antibiotic Note  Joanne [name] is a 49 y.o. female admitted on 05/15/2020 with bacteremia.  Pharmacy has been consulted for pip/tazo dosing. ID following. 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  7/24 Bcx 1 out 4 bottles: PEPTOSTREPTOCOCCUS SPECIES + PROTEUS MIRABILIS  Repeat cultures 7/27 no growth. Another set of cultures were obtained 7/30 and are pending.  Plan: Continue Zosyn 3.375 g IV q8h extended infusion  Height: 5\' 4"  (162.6 cm) Weight: 67.1 kg (147 lb 14.9 oz) IBW/kg (Calculated) : 54.7  Temp (24hrs), Avg:95.6 F (35.3 C), Min:92.3 F (33.5 C), Max:97.5 F (36.4 C)  Recent Labs  Lab 05/16/20 1716 05/17/20 0038 05/17/20 0519 05/17/20 0927 05/17/20 1233 05/18/20 0047 05/18/20 0657 05/18/20 0657 05/19/20 0409 05/20/20 0019 05/20/20 0415 05/20/20 1013 05/21/20 0532  WBC  --    < >   < >  --   --   --  21.2*  --  20.2* 17.0*  --  10.8* 22.2*  CREATININE 0.95   < >   < >  --    < >  --  0.89   < > 0.78 1.02* 0.98 1.09* 1.22*  LATICACIDVEN 2.8*  --   --  2.7*  --  3.7*  --   --   --  2.0*  --  1.4  --    < > = values in this interval not displayed.    Estimated Creatinine Clearance: 52 mL/min (A) (by C-G formula based on SCr of 1.22 mg/dL (H)).    Allergies  Allergen Reactions  . Kaopectate  [Attapulgite] Itching  . Thiopental Itching  . Tomato     Antimicrobials this admission: 7/24 cefepime >> 7/27 7/27 meropenem >> 7/28 7/24 vancomycin >> 7/28  7/28 Zosyn >>   Microbiology results: 7/24 BCx: peptostreptococcus species, proteus mirabilis 7/27 BCx: NGTD 7/30 BCx: pending 7/25 MRSA PCR: negative  Thank you for allowing pharmacy to be a part of this patient's care.  8/25, PharmD 05/21/2020 1:44 PM

## 2020-05-21 NOTE — Progress Notes (Signed)
Pharmacy Electrolyte Monitoring Consult:  Pharmacy consulted to assist in monitoring and replacing electrolytes in this 49 y.o. female admitted on 05/15/2020  Labs:  Sodium (mmol/L)  Date Value  05/20/2020 150 (H)   Potassium (mmol/L)  Date Value  05/20/2020 4.2   Magnesium (mg/dL)  Date Value  77/41/2878 2.3   Phosphorus (mg/dL)  Date Value  67/67/2094 3.5   Calcium (mg/dL)  Date Value  70/96/2836 7.7 (L)   Albumin (g/dL)  Date Value  62/94/7654 3.65 (L)    49 year old female with h/o TBI, seizures, hypothyroidism, diabetes. Patient admitted to ICU and subsequently transferred to the floor. Rapid response overnight for hypoxia and patient readmitted to the ICU. Blood cultures with peptostreptococcus and proteus. 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  Assessment/Plan: Patient received furosemide and sodium bicarbonate during rapid response. Suspect this may have contributed to acute drop in potassium. Suspect will likely remain low while patient not receiving any nutrition. Potassium 2.4 this morning and given 10 mEq IV x 3 runs with recheck 3.0. Suspect recheck was falsely elevated as patient was receiving last run of K when drawn.  Potassium now back down to 2.4. Unfortunately, patient unable to receive any PO supplementation. Will start with potassium 10 mEq IV x 5 runs for now.  07/29 @ 2200 K 4.2 WNL no replacement required currently, will continue to monitor w/ am labs and replace as needed.  Thomasene Ripple, PharmD, BCPS Clinical Pharmacist 05/21/2020 12:22 AM

## 2020-05-22 LAB — COMPREHENSIVE METABOLIC PANEL
ALT: 14 U/L (ref 0–44)
AST: 21 U/L (ref 15–41)
Albumin: 2.3 g/dL — ABNORMAL LOW (ref 3.5–5.0)
Alkaline Phosphatase: 105 U/L (ref 38–126)
Anion gap: 7 (ref 5–15)
BUN: 20 mg/dL (ref 6–20)
CO2: 22 mmol/L (ref 22–32)
Calcium: 7.6 mg/dL — ABNORMAL LOW (ref 8.9–10.3)
Chloride: 116 mmol/L — ABNORMAL HIGH (ref 98–111)
Creatinine, Ser: 1.21 mg/dL — ABNORMAL HIGH (ref 0.44–1.00)
GFR calc Af Amer: >60 mL/min (ref >60)
GFR calc non Af Amer: 52 mL/min — ABNORMAL LOW (ref >60)
Glucose, Bld: 253 mg/dL — ABNORMAL HIGH (ref 70–99)
Potassium: 3.3 mmol/L — ABNORMAL LOW (ref 3.5–5.1)
Sodium: 145 mmol/L (ref 135–145)
Total Bilirubin: 1 mg/dL (ref 0.3–1.2)
Total Protein: 5.4 g/dL — ABNORMAL LOW (ref 6.5–8.1)

## 2020-05-22 LAB — MAGNESIUM: Magnesium: 2 mg/dL (ref 1.7–2.4)

## 2020-05-22 LAB — GLUCOSE, CAPILLARY
Glucose-Capillary: 168 mg/dL — ABNORMAL HIGH (ref 70–99)
Glucose-Capillary: 168 mg/dL — ABNORMAL HIGH (ref 70–99)
Glucose-Capillary: 188 mg/dL — ABNORMAL HIGH (ref 70–99)
Glucose-Capillary: 198 mg/dL — ABNORMAL HIGH (ref 70–99)
Glucose-Capillary: 223 mg/dL — ABNORMAL HIGH (ref 70–99)
Glucose-Capillary: 229 mg/dL — ABNORMAL HIGH (ref 70–99)

## 2020-05-22 LAB — HEMOGLOBIN A1C
Hgb A1c MFr Bld: 6.1 % — ABNORMAL HIGH (ref 4.8–5.6)
Mean Plasma Glucose: 128.37 mg/dL

## 2020-05-22 LAB — CBC
HCT: 20.1 % — ABNORMAL LOW (ref 36.0–46.0)
Hemoglobin: 7.1 g/dL — ABNORMAL LOW (ref 12.0–15.0)
MCH: 28.6 pg (ref 26.0–34.0)
MCHC: 35.3 g/dL (ref 30.0–36.0)
MCV: 81 fL (ref 80.0–100.0)
Platelets: 66 10*3/uL — ABNORMAL LOW (ref 150–400)
RBC: 2.48 MIL/uL — ABNORMAL LOW (ref 3.87–5.11)
RDW: 18.4 % — ABNORMAL HIGH (ref 11.5–15.5)
WBC: 14.5 10*3/uL — ABNORMAL HIGH (ref 4.0–10.5)
nRBC: 1.2 % — ABNORMAL HIGH (ref 0.0–0.2)

## 2020-05-22 LAB — PHOSPHORUS: Phosphorus: 1.8 mg/dL — ABNORMAL LOW (ref 2.5–4.6)

## 2020-05-22 MED ORDER — SODIUM BICARBONATE 8.4 % IV SOLN
INTRAVENOUS | Status: DC
  Filled 2020-05-22 (×5): qty 150

## 2020-05-22 MED ORDER — SODIUM BICARBONATE-DEXTROSE 150-5 MEQ/L-% IV SOLN: 150.0000 meq | INTRAVENOUS | Status: DC

## 2020-05-22 MED ORDER — POTASSIUM PHOSPHATES 15 MMOLE/5ML IV SOLN
30.0000 mmol | Freq: Once | INTRAVENOUS | Status: AC
  Administered 2020-05-22: 30 mmol via INTRAVENOUS
  Filled 2020-05-22: qty 10

## 2020-05-22 MED ORDER — POTASSIUM CHLORIDE 20 MEQ PO PACK
40.0000 meq | PACK | Freq: Once | ORAL | Status: AC
  Administered 2020-05-22: 40 meq
  Filled 2020-05-22: qty 2

## 2020-05-22 NOTE — Progress Notes (Addendum)
CRITICAL CARE NOTE  BRIEF PATIENT DESCRIPTION:  49yo F with TBI , trach status, seizure disorder as well as multiple additional comorbid conditions admitted with Septic Shock due to Proteus bacteremia and peptostreptococcus in setting of Stage IV Sacral Decubitus with Osteomyelitis.  SIGNIFICANT EVENTS/STUDIES:  07/23: Pt admitted to stepdown unit with sepsis but remained housed in the ER pending bed availability  07/24: CT Pelvis revealed large sacral decubitus ulcer which has progressed significantly since prior study. Extension to the underlying sacrum and coccyx with evidence of osteomyelitis and bone destruction. No evidence of fluid collection or abscess. Stable midline ventral hernia as without evidence of bowel obstruction or strangulation. 07/24: Pt with hypotension PCCM team consulted to assist with management  7/26: General Surgery consulted, performed bedside debridement 7/26: Palliative Care consulted, pt's POA requests pt remain Full Code with aggressive medical interventions 7/27: ID consulted due to Proteus & peptostreptococcus  Bacteremia 7/27: 2D Echocardiogram: Left Ventricle: Left ventricular ejection fraction, by estimation, is 60  to 65%. The left ventricle has normal function. The left ventricle has no regional wall motion abnormalities. The left ventricular internal cavity size was normal in size. There is mild left ventricular hypertrophy. Left ventricular diastolic parameters  are indeterminate.  7/27: Weaned off Vasopressors 7/28: Transfer out to Med-Surg unit 7/29: RR called due to Hypoxia in setting of pulmonary edema; transfer to ICU; PCCM reconsulted 7/29: Cuffless shiley exchanged for Cuffed shiley, placed on vent; Hypotensive requiring vasopressors 7/30: Left Femoral CVC & Left Femoral A-line placed  Antimicrobials this admission: 7/24 cefepime >> 7/27 7/27 meropenem >> 7/28 7/24 vancomycin >> 7/28  7/28 Zosyn >>  Microbiology results: 7/24 BCx:  peptostreptococcus species, proteus mirabilis 7/27 BCx: NGTD 7/30 BCx: pending 7/25 MRSA PCR: negative  CC Follow up respiratory failure  SUBJECTIVE:  Remains in ICU, hypotensive on pressor, on vent through trach, hypothermic, on warmer. WBC up to 22 from 17  REVIEW OF SYSTEMS:   Unable to obtain due to sedation and TBI with cognitive impairment   OBJECTIVE  VITAL SIGNS: Temp:  [97.2 F (36.2 C)-98.1 F (36.7 C)] 98.1 F (36.7 C) (07/31 1300) Pulse Rate:  [47-89] 69 (07/31 1300) Resp:  [20-41] 30 (07/31 1200) BP: (94-115)/(31-92) 102/48 (07/31 1300) SpO2:  [93 %-100 %] 97 % (07/31 1300) Arterial Line BP: (81-109)/(36-74) 87/43 (07/31 1300) FiO2 (%):  [35 %-45 %] 35 % (07/31 1200) Weight:  [67.7 kg] 67.7 kg (07/31 0500)  Vent Mode: PCV FiO2 (%):  [35 %-45 %] 35 % Set Rate:  [20 bmp] 20 bmp PEEP:  [5 cmH20] 5 cmH20 Plateau Pressure:  [28 cmH20-30 cmH20] 28 cmH20  PHYSICAL EXAMINATION: General: Acute on chronically ill-appearing female status post TBI, lightly sedated with Precedex, on mechanical ventilation in no acute distress Neuro: Sedated on Precedex (nable to follow commands due to sedation), right paraplegic  HEENT: Atraumatic, normocephalic, neck supple, no JVD, pupils PERRLA  Cardiovascular: Bradycardia, regular rhythm, S1-S2, no murmurs, rubs, gallops  Lungs: Coarse breath sounds throughout upon auscultation, no wheezing, vent assisted, even Abdomen: Soft, nondistended, no guarding or rebound tenderness, bowel sounds positive x4  Musculoskeletal: Right paraplegic Skin: Large sacral decubitus with bony destruction of the sacral spine present on admission   MEDICATION: Prior to Admission medications   Medication Sig Start Date End Date Taking? Authorizing Provider  acetaminophen (TYLENOL) 325 MG tablet Take 2 tablets (650 mg total) by mouth every 6 (six) hours as needed for mild pain or fever (or Fever >/= 101). 11/20/19  Yes  Wyvonnia Dusky, MD  aspirin 81 MG  EC tablet aspirin 81 mg tablet,delayed release  Take 1 tablet every day by oral route.   Yes [provider]  B Complex-C-E-Zn (B COMPLEX-C-E-ZINC) tablet Take by mouth.   Yes [provider]  carbamazepine (TEGRETOL XR) 100 MG 12 hr tablet Take 100 mg by mouth 2 (two) times daily.   Yes [provider]  cetirizine (ZYRTEC) 10 MG tablet Take by mouth.   Yes [provider]  cyanocobalamin 50 MCG tablet Take by mouth.   Yes [provider]  famotidine (PEPCID) 20 MG tablet Take 20 mg by mouth 2 (two) times daily.   Yes [provider]  ferrous sulfate 325 (65 FE) MG tablet Take 325 mg by mouth daily.   Yes [provider]  folic acid (FOLVITE) 1 MG tablet Take 1 mg by mouth daily. 07/28/19  Yes [provider]  levothyroxine (SYNTHROID) 100 MCG tablet Take 100 mcg by mouth daily. 07/18/19  Yes [provider]  loratadine (CLARITIN) 10 MG tablet Take 20 mg by mouth daily.   Yes [provider]  midodrine (PROAMATINE) 5 MG tablet Take 5 mg by mouth 3 (three) times daily with meals.  05/06/20  Yes [provider]  MULTIPLE VITAMIN PO Take 1 tablet by mouth daily.   Yes [provider]  Neomy-Bacit-Polymyx-Pramoxine 1 % OINT Apply topically. Apply to (L) Lower buttock/At fold typically every evening shift for wound. Cover with gauze/conversite; Change QD 'til healed.   Yes [provider]  oxyCODONE (OXY IR/ROXICODONE) 5 MG immediate release tablet Take 1 tablet (5 mg total) by mouth every 4 (four) hours as needed for moderate pain. 03/16/20  Yes Sreenath, Sudheer B, MD  polyethylene glycol powder (GLYCOLAX/MIRALAX) 17 GM/SCOOP powder Take 17 g by mouth daily.   Yes [provider]  Polyvinyl Alcohol-Povidone PF 1.4-0.6 % SOLN Place 1 drop into both eyes 3 (three) times daily.   Yes [provider]  SANTYL ointment Apply topically. 05/12/20  Yes [provider]   tamsulosin (FLOMAX) 0.4 MG CAPS capsule Take 0.4 mg by mouth at bedtime.  06/16/19  Yes [provider]  traMADol (ULTRAM) 50 MG tablet Take 25 mg by mouth 3 (three) times daily. 08/05/19  Yes [provider]   Scheduled Meds:  carbamazepine  50 mg Per Tube QID   chlorhexidine gluconate (MEDLINE KIT)  15 mL Mouth Rinse BID   Chlorhexidine Gluconate Cloth  6 each Topical Daily   collagenase   Topical Daily   docusate  100 mg Oral BID   dronabinol  2.5 mg Oral BID AC   feeding supplement (PROSource TF)  45 mL Per Tube Daily   free water  200 mL Per Tube Q4H   hydrocortisone sod succinate (SOLU-CORTEF) inj  50 mg Intravenous Q6H   insulin aspart  0-9 Units Subcutaneous Q4H   levothyroxine  50 mcg Intravenous Q0600   mouth rinse  15 mL Mouth Rinse 10 times per day   midodrine  10 mg Oral TID WC   pantoprazole (PROTONIX) IV  40 mg Intravenous Q24H   polyethylene glycol  17 g Oral Daily   sodium chloride flush  10-40 mL Intracatheter Q12H   Continuous Infusions:  sodium chloride 10 mL/hr at 05/22/20 1154   sodium chloride 250 mL (05/19/20 2252)   dexmedetomidine (PRECEDEX) IV infusion Stopped (05/21/20 1512)   feeding supplement (VITAL 1.5 CAL) 1,000 mL (05/22/20 1248)   norepinephrine (  LEVOPHED) Adult infusion 4 mcg/min (05/22/20 1154)   piperacillin-tazobactam (ZOSYN)  IV 3.375 g (05/22/20 1315)   potassium PHOSPHATE IVPB (in mmol) 85 mL/hr at 05/22/20 1154    sodium bicarbonate  infusion 1000 mL 75 mL/hr at 05/22/20 1128   sodium bicarbonate 150 mEq in dextrose 5% 1000 mL 75 mL/hr at 05/22/20 1154   PRN Meds:.acetaminophen, bisacodyl, fentaNYL (SUBLIMAZE) injection, fentaNYL (SUBLIMAZE) injection, midazolam, midazolam, morphine injection, sodium chloride flush  Allergies  Allergen Reactions   Kaopectate  [Attapulgite] Itching   Thiopental Itching   Tomato     Recent Labs  Lab 05/20/20 1013 05/20/20 1013 05/20/20 2147  05/21/20 0532 05/22/20 0417  NA 150*  --   --  149* 145  K 2.4*   < > 4.2 3.3* 3.3*  CL 123*  --   --  123* 116*  CO2 18*  --   --  17* 22  BUN 19  --   --  20 20  CREATININE 1.09*  --   --  1.22* 1.21*  GLUCOSE 108*  --   --  139* 253*   < > = values in this interval not displayed.   Recent Labs  Lab 05/20/20 1013 05/21/20 0532 05/22/20 0417  HGB 7.3* 8.3* 7.1*  HCT 21.8* 24.0* 20.1*  WBC 10.8* 22.2* 14.5*  PLT 97* 114* 66*   No results found.  Media Information   Document Information  Photos    05/16/2020 17:05  Attached To:  Hospital Encounter on 05/15/20  Source Information       ASSESSMENT AND PLAN  Acute Hypoxic Respiratory Failure secondary to Pulmonary Edema -Full vent support -Wean FiO2 and PEEP as tolerated to maintain O2 saturations greater than 92% -Follow intermittent chest x-ray and ABG as needed -VAP protocol -Spontaneous breathing trials when respiratory parameters met -Lasix as blood pressure and renal function permits   Septic shock Acute Decompensated Diastolic CHF -Continuous cardiac monitoring -Maintain MAP greater than 65 -Cautious IV fluids given pulmonary edema -Vasopressors as needed to maintain MAP greater than 65 -Continue midodrine -Lasix as blood pressure and renal function permits -Echocardiogram on 05/18/2020 with EF 60 to 64%, diastolic parameters indeterminate   Severe Sepsis due to Proteus bacteremia and peptostreptococcus, in setting of Stage IV Sacral Decubitus with Osteomyelitis -Monitor fever curve -Trend WBCs and procalcitonin -Blood cultures repeated due to worsening white count per ID recs -Continue Zosyn for now, will consider MRSA coverage if continues to worsen pending BCx as above -Surgery consulted, performed bedside debridement on 05/17/2020 -Wound care -Continue antibiotic -Will need surgical removal of infected bone, given current condition, will likely not survive surgery -ID input  appreciated   AKI on CKD Stage II>> improving Hypernatremia (likely hypotonic hypervolemic in setting of volume overload) Hypokalemia -Monitor I&O's / urinary output -Follow BMP -Ensure adequate renal perfusion -Avoid nephrotoxic agents as able -Replace electrolytes as indicated -Pharmacy consulted for assistance and electrolyte replacement -Volume removal as able  Anemia Thrombocytopenia, likely in setting of sepsis -Monitor for S/Sx of bleeding -Trend CBC -SCD's for VTE Prophylaxis  -Transfuse for Hgb <7 -Transfuse Platelets for Platelet count <50 with active bleeding  Hypothyroidism -Continue Synthroid  TBI w/ Seizure disorder -Continue Tegretol     Pt is critically ill.  Prognosis is guarded.  High risk for cardiac arrest and death.  Recommend DNR/DNI status, and comfort care measures.    BEST PRACTICES DISPOSITION: ICU GOALS OF CARE: full code VTE PROPHYLAXIS: SCD's CONSULTS: ID, General Surgery UPDATES: No family  at bedside during NP rounds 05/21/20    Rufina Falco. DNP, CCRN, FNP-BC [place]

## 2020-05-22 NOTE — Progress Notes (Signed)
CH visited pt. as follow-up from rapid response on Wed. night; pt. sitting up in bed apparently asleep w/TV on; CH greeted pt. and her eyes opened slightly --> CH offered brief prayer at bedside for pt.'s sense of divine presence.  CH remains available as needed.

## 2020-05-22 NOTE — Progress Notes (Addendum)
Patient tolerating current tube feeds, tube feeds increased per order by 110ml/hr, now infusing at 80ml/hr.

## 2020-05-22 NOTE — Consult Note (Signed)
Pharmacy Antibiotic Note  Joanne [name] is a 49 y.o. female admitted on 05/15/2020 with bacteremia.  Pharmacy has been consulted for pip/tazo dosing. ID following. 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  7/24 Bcx 1 out 4 bottles: PEPTOSTREPTOCOCCUS SPECIES + PROTEUS MIRABILIS  Repeat cultures 7/27 no growth. Another set of cultures were obtained 7/30 and are pending.  Plan: Day 8 of total abx. Day 4 of pip/tazo. Continue Zosyn 3.375 g IV q8h extended infusion.  Height: 5\' 4"  (162.6 cm) Weight: 67.7 kg (149 lb 4 oz) IBW/kg (Calculated) : 54.7  Temp (24hrs), Avg:97.4 F (36.3 C), Min:97.2 F (36.2 C), Max:97.9 F (36.6 C)  Recent Labs  Lab 05/16/20 1716 05/17/20 0038 05/17/20 0927 05/17/20 1233 05/18/20 0047 05/18/20 0657 05/19/20 0409 05/19/20 0409 05/20/20 0019 05/20/20 0415 05/20/20 1013 05/21/20 0532 05/22/20 0417  WBC  --    < >  --   --   --    < > 20.2*  --  17.0*  --  10.8* 22.2* 14.5*  CREATININE 0.95   < >  --    < >  --    < > 0.78   < > 1.02* 0.98 1.09* 1.22* 1.21*  LATICACIDVEN 2.8*  --  2.7*  --  3.7*  --   --   --  2.0*  --  1.4  --   --    < > = values in this interval not displayed.    Estimated Creatinine Clearance: 52.6 mL/min (A) (by C-G formula based on SCr of 1.21 mg/dL (H)).    Allergies  Allergen Reactions  . Kaopectate  [Attapulgite] Itching  . Thiopental Itching  . Tomato     Antimicrobials this admission: 7/24 cefepime >> 7/27 7/27 meropenem >> 7/28 7/24 vancomycin >> 7/28  7/28 Zosyn >>   Microbiology results: 7/24 BCx: peptostreptococcus species, proteus mirabilis 7/27 BCx: NGTD 7/30 BCx: pending 7/25 MRSA PCR: negative  Thank you for allowing pharmacy to be a part of this patient's care.  8/25, PharmD, BCPS 05/22/2020 7:16 AM

## 2020-05-22 NOTE — Progress Notes (Signed)
Pharmacy Electrolyte Monitoring Consult:  Pharmacy consulted to assist in monitoring and replacing electrolytes in this 49 y.o. female admitted on 05/15/2020  Labs:  Sodium (mmol/L)  Date Value  05/22/2020 145   Potassium (mmol/L)  Date Value  05/22/2020 3.3 (L)   Magnesium (mg/dL)  Date Value  77/82/4235 2.0   Phosphorus (mg/dL)  Date Value  36/14/4315 1.8 (L)   Calcium (mg/dL)  Date Value  40/05/6760 7.6 (L)   Albumin (g/dL)  Date Value  95/06/3266 2.1 (L)    49 year old female with h/o TBI, seizures, hypothyroidism, diabetes. Patient admitted to ICU and subsequently transferred to the floor. Rapid response overnight for hypoxia and patient readmitted to the ICU. Blood cultures with peptostreptococcus and proteus. 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  Assessment/Plan: Electrolytes have improved. Patient to start tube feeds today, will be at risk for refeeding syndrome.  Medical team ordered potassium phosphate 30 mmol x 1. Will also order KCl 40 mEq PO x 1.   All electrolytes with morning labs.  Paschal Dopp PharmD, BCPS Clinical Pharmacist 05/22/2020 7:13 AM

## 2020-05-22 NOTE — Progress Notes (Signed)
Patient remains resting in bed receiving Vital 1.5 @ goal.  She nods and shakes her head in answer to questions.  Her triple lumen PICC infuses without difficulty.  Her vitals remain WDL.  The a-line returns after zeroing with a SBP in the 80's but with a MAP above 70.  She does not complain of pain.  Held her Marinol d/t her inability to swallow PO today.  The wound dressings on her sacrum and thighs were changed after cleaning patient and reinserting her rectal tube.  UOP is good.  She remains on vent 35% FiO2 saturating in the mid 90's.  Levo Bicarb @ 75.

## 2020-05-23 ENCOUNTER — Encounter: Payer: Self-pay | Admitting: Internal Medicine

## 2020-05-23 ENCOUNTER — Inpatient Hospital Stay: Payer: Medicare PPO

## 2020-05-23 LAB — MAGNESIUM: Magnesium: 1.8 mg/dL (ref 1.7–2.4)

## 2020-05-23 LAB — CULTURE, BLOOD (ROUTINE X 2)
Culture: NO GROWTH
Culture: NO GROWTH
Special Requests: ADEQUATE

## 2020-05-23 LAB — GLUCOSE, CAPILLARY
Glucose-Capillary: 114 mg/dL — ABNORMAL HIGH (ref 70–99)
Glucose-Capillary: 128 mg/dL — ABNORMAL HIGH (ref 70–99)
Glucose-Capillary: 136 mg/dL — ABNORMAL HIGH (ref 70–99)
Glucose-Capillary: 138 mg/dL — ABNORMAL HIGH (ref 70–99)
Glucose-Capillary: 177 mg/dL — ABNORMAL HIGH (ref 70–99)
Glucose-Capillary: 244 mg/dL — ABNORMAL HIGH (ref 70–99)

## 2020-05-23 LAB — POTASSIUM: Potassium: 2.7 mmol/L — CL (ref 3.5–5.1)

## 2020-05-23 LAB — PHOSPHORUS: Phosphorus: 3.5 mg/dL (ref 2.5–4.6)

## 2020-05-23 MED ORDER — POTASSIUM CHLORIDE 10 MEQ/50ML IV SOLN
10.0000 meq | INTRAVENOUS | Status: AC
  Administered 2020-05-23 (×4): 10 meq via INTRAVENOUS
  Filled 2020-05-23 (×4): qty 50

## 2020-05-23 MED ORDER — HYDROCORTISONE NA SUCCINATE PF 100 MG IJ SOLR
50.0000 mg | Freq: Three times a day (TID) | INTRAMUSCULAR | Status: AC
  Administered 2020-05-23 – 2020-05-25 (×6): 50 mg via INTRAVENOUS
  Filled 2020-05-23 (×5): qty 2

## 2020-05-23 MED ORDER — MAGNESIUM SULFATE IN D5W 1-5 GM/100ML-% IV SOLN
1.0000 g | Freq: Once | INTRAVENOUS | Status: AC
  Administered 2020-05-23: 1 g via INTRAVENOUS
  Filled 2020-05-23: qty 100

## 2020-05-23 NOTE — Progress Notes (Signed)
Patient upon assessment had leakage from her rectal tube.  Cleaned and changed dressing foam on bilateral hips.  Lab called and relayed potassium of 2.7.  Will notify physician.

## 2020-05-23 NOTE — Progress Notes (Signed)
CRITICAL CARE PROGRESS  NOTE  BRIEF PATIENT DESCRIPTION:  49yo F with TBI , trach status, seizure disorder as well as multiple additional comorbid conditions admitted with Septic Shock due to Proteus bacteremia and peptostreptococcus in setting of Stage IV Sacral Decubitus with Osteomyelitis.  SIGNIFICANT EVENTS/STUDIES:  07/23: Pt admitted to stepdown unit with sepsis but remained housed in the ER pending bed availability  07/24: CT Pelvis revealed large sacral decubitus ulcer which has progressed significantly since prior study. Extension to the underlying sacrum and coccyx with evidence of osteomyelitis and bone destruction. No evidence of fluid collection or abscess. Stable midline ventral hernia as without evidence of bowel obstruction or strangulation. 07/24: Pt with hypotension PCCM team consulted to assist with management  7/26: General Surgery consulted, performed bedside debridement 7/26: Palliative Care consulted, pt's POA requests pt remain Full Code with aggressive medical interventions 7/27: ID consulted due to Proteus & peptostreptococcus  Bacteremia 7/27: 2D Echocardiogram: Left Ventricle: Left ventricular ejection fraction, by estimation, is 60  to 65%. The left ventricle has normal function. The left ventricle has no regional wall motion abnormalities. The left ventricular internal cavity size was normal in size. There is mild left ventricular hypertrophy. Left ventricular diastolic parameters  are indeterminate.  7/27: Weaned off Vasopressors 7/28: Transfer out to Med-Surg unit 7/29: RR called due to Hypoxia in setting of pulmonary edema; transfer to ICU; PCCM reconsulted 7/29: Cuffless shiley exchanged for Cuffed shiley, placed on vent; Hypotensive requiring vasopressors 7/30: Left Femoral CVC & Left Femoral A-line placed 8/01:A-line D/C  Antimicrobials this admission: 7/24 cefepime >> 7/27 7/27 meropenem >> 7/28 7/24 vancomycin >> 7/28  7/28 Zosyn >>  Microbiology  results: 7/24 BCx: peptostreptococcus species, proteus mirabilis 7/27 BCx: NGTD 7/30 BCx: NGTD 7/25 MRSA PCR: negative  CC Follow up respiratory failure  SUBJECTIVE:  Remains in ICU, off pressors, on vent through trach, on warmer.  Leukocytosis resolving.  She is on stress dose steroids for 2 more days  REVIEW OF SYSTEMS:   Unable to obtain due to sedation and TBI with cognitive impairment, on vent.  OBJECTIVE  VITAL SIGNS: Temp:  [97.2 F (36.2 C)-98.1 F (36.7 C)] 98.1 F (36.7 C) (07/31 1300) Pulse Rate:  [47-89] 69 (07/31 1300) Resp:  [20-41] 30 (07/31 1200) BP: (94-115)/(31-92) 102/48 (07/31 1300) SpO2:  [93 %-100 %] 97 % (07/31 1300) Arterial Line BP: (81-109)/(36-74) 87/43 (07/31 1300) FiO2 (%):  [35 %-45 %] 35 % (07/31 1200) Weight:  [67.7 kg] 67.7 kg (07/31 0500)  Vent Mode: PCV FiO2 (%):  [35 %] 35 % Set Rate:  [20 bmp] 20 bmp PEEP:  [5 cmH20] 5 cmH20 Plateau Pressure:  [33 cmH20-34 cmH20] 34 cmH20  PHYSICAL EXAMINATION: General: Acute on chronically ill-appearing female status post TBI, lightly sedated with Precedex, on mechanical ventilation in no acute distress Neuro: Sedated on Precedex (unable to follow commands due to sedation TBI), right paraplegic  HEENT: Atraumatic, normocephalic, neck supple, no JVD, pupils PERRLA  Cardiovascular: Bradycardia, regular rhythm, S1-S2, no murmurs, rubs, gallops  Lungs: Coarse breath sounds throughout upon auscultation, no wheezing, vent assisted, even Abdomen: Soft, nondistended, no guarding or rebound tenderness, bowel sounds positive x4  Musculoskeletal: Right paraplegia.  Diffuse muscle wasting Skin: Large sacral decubitus with bony destruction of the sacral spine present on admission   MEDICATION: Prior to Admission medications   Medication Sig Start Date End Date Taking? Authorizing Provider  acetaminophen (TYLENOL) 325 MG tablet Take 2 tablets (650 mg total) by mouth every 6 (six)  hours as needed for mild pain  or fever (or Fever >/= 101). 11/20/19  Yes Wyvonnia Dusky, MD  aspirin 81 MG EC tablet aspirin 81 mg tablet,delayed release  Take 1 tablet every day by oral route.   Yes [provider]  B Complex-C-E-Zn (B COMPLEX-C-E-ZINC) tablet Take by mouth.   Yes [provider]  carbamazepine (TEGRETOL XR) 100 MG 12 hr tablet Take 100 mg by mouth 2 (two) times daily.   Yes [provider]  cetirizine (ZYRTEC) 10 MG tablet Take by mouth.   Yes [provider]  cyanocobalamin 50 MCG tablet Take by mouth.   Yes [provider]  famotidine (PEPCID) 20 MG tablet Take 20 mg by mouth 2 (two) times daily.   Yes [provider]  ferrous sulfate 325 (65 FE) MG tablet Take 325 mg by mouth daily.   Yes [provider]  folic acid (FOLVITE) 1 MG tablet Take 1 mg by mouth daily. 07/28/19  Yes [provider]  levothyroxine (SYNTHROID) 100 MCG tablet Take 100 mcg by mouth daily. 07/18/19  Yes [provider]  loratadine (CLARITIN) 10 MG tablet Take 20 mg by mouth daily.   Yes [provider]  midodrine (PROAMATINE) 5 MG tablet Take 5 mg by mouth 3 (three) times daily with meals.  05/06/20  Yes [provider]  MULTIPLE VITAMIN PO Take 1 tablet by mouth daily.   Yes [provider]  Neomy-Bacit-Polymyx-Pramoxine 1 % OINT Apply topically. Apply to (L) Lower buttock/At fold typically every evening shift for wound. Cover with gauze/conversite; Change QD 'til healed.   Yes [provider]  oxyCODONE (OXY IR/ROXICODONE) 5 MG immediate release tablet Take 1 tablet (5 mg total) by mouth every 4 (four) hours as needed for moderate pain. 03/16/20  Yes Sreenath, Sudheer B, MD  polyethylene glycol powder (GLYCOLAX/MIRALAX) 17 GM/SCOOP powder Take 17 g by mouth daily.   Yes [provider]  Polyvinyl Alcohol-Povidone PF 1.4-0.6 % SOLN Place 1 drop into both eyes 3 (three) times daily.   Yes [provider]  SANTYL ointment Apply topically. 05/12/20  Yes [provider]  tamsulosin (FLOMAX) 0.4 MG CAPS capsule Take 0.4 mg by mouth at bedtime.  06/16/19  Yes [provider]  traMADol (ULTRAM) 50 MG tablet Take 25 mg by mouth 3 (three) times daily. 08/05/19  Yes [provider]   Scheduled Meds: . carbamazepine  50 mg Per Tube QID  . chlorhexidine gluconate (MEDLINE KIT)  15 mL Mouth Rinse BID  . Chlorhexidine Gluconate Cloth  6 each Topical Daily  . collagenase   Topical Daily  . docusate  100 mg Oral BID  . dronabinol  2.5 mg Oral BID AC  . feeding supplement (PROSource TF)  45 mL Per Tube Daily  . free water  200 mL Per Tube Q4H  . hydrocortisone sod succinate (SOLU-CORTEF) inj  50 mg Intravenous Q8H  . insulin aspart  0-9 Units Subcutaneous Q4H  . levothyroxine  50 mcg Intravenous Q0600  . mouth rinse  15 mL Mouth Rinse 10 times per day  . midodrine  10 mg Oral TID WC  . pantoprazole (PROTONIX) IV  40 mg Intravenous Q24H  . polyethylene glycol  17 g Oral Daily  . sodium chloride flush  10-40 mL Intracatheter Q12H   Continuous Infusions: . sodium chloride 10 mL/hr at 05/22/20 1154  . sodium chloride 250 mL (05/19/20 2252)  . dexmedetomidine (PRECEDEX) IV infusion Stopped (05/21/20  1512)  . feeding supplement (VITAL 1.5 CAL) 1,000 mL (05/23/20 1507)  . norepinephrine (LEVOPHED) Adult infusion 4 mcg/min (05/23/20 1500)  . piperacillin-tazobactam (ZOSYN)  IV 12.5 mL/hr at 05/23/20 1500  .  sodium bicarbonate  infusion 1000 mL Stopped (05/23/20 0919)   PRN Meds:.acetaminophen, bisacodyl, fentaNYL (SUBLIMAZE) injection, fentaNYL (SUBLIMAZE) injection, midazolam, midazolam, morphine injection, sodium chloride flush  Allergies  Allergen Reactions  . Kaopectate  [Attapulgite] Itching  . Thiopental Itching  . Tomato     Recent Labs  Lab 05/20/20 1013 05/20/20 2147 05/21/20 0532 05/22/20 0417 05/23/20 0529  NA 150*  --  149* 145  --   K 2.4*   < > 3.3*  3.3* 2.7*  CL 123*  --  123* 116*  --   CO2 18*  --  17* 22  --   BUN 19  --  20 20  --   CREATININE 1.09*  --  1.22* 1.21*  --   GLUCOSE 108*  --  139* 253*  --    < > = values in this interval not displayed.   Recent Labs  Lab 05/20/20 1013 05/21/20 0532 05/22/20 0417  HGB 7.3* 8.3* 7.1*  HCT 21.8* 24.0* 20.1*  WBC 10.8* 22.2* 14.5*  PLT 97* 114* 66*   DG Chest Port 1 View  Result Date: 05/23/2020 CLINICAL DATA:  Traumatic brain injury. Chronic ventilator dependent respiratory failure. Follow-up pneumonia. EXAM: PORTABLE CHEST 1 VIEW COMPARISON:  05/20/2020 and earlier. FINDINGS: Endotracheal tube tip in satisfactory position projecting approximately 4 cm above the carina. Cardiac silhouette normal in size, unchanged. Since the examination 3 days ago, significant improvement in aeration in the RIGHT UPPER LOBE, though mild airspace consolidation persists. Interval worsening of airspace opacities in the BILATERAL lower lobes and lingula. Pulmonary vascularity normal. IMPRESSION: 1. Endotracheal tube tip in satisfactory position projecting approximately 4 cm above the carina. 2. Mixed response to therapy, with improvement in the RIGHT UPPER LOBE pneumonia since the examination 3 days ago, but interval worsening of pneumonia in the BILATERAL lower lobes and in the lingula. Electronically Signed   By: Evangeline Dakin M.D.   On: 05/23/2020 09:07    Media Information   Document Information  Photos    05/16/2020 17:05  Attached To:  Hospital Encounter on 05/15/20  Source Information       ASSESSMENT AND PLAN  Acute Hypoxic Respiratory Failure secondary to Pulmonary Edema -Full vent support -Wean FiO2 and PEEP as tolerated to maintain O2 saturations greater than 92% -Follow intermittent chest x-ray and ABG as needed -VAP protocol -Spontaneous breathing trials when respiratory parameters met -Edema has resolved -Diuretics as needed and as tolerated   Septic shock Acute  Decompensated Diastolic CHF -Continuous cardiac monitoring -Maintain MAP greater than 65 -Cautious IV fluids given pulmonary edema -Vasopressors as needed to maintain MAP greater than 65 -Currently off vasopressors -Continue midodrine -Lasix as blood pressure and renal function permits -Echocardiogram on 05/18/2020 with EF 60 to 66%, diastolic parameters indeterminate   Severe Sepsis due to Proteus bacteremia and peptostreptococcus, in setting of Stage IV Sacral Decubitus with Osteomyelitis -Monitor fever curve -Trend WBCs and procalcitonin -Blood cultures repeated due to worsening white count per ID recs -Continue Zosyn for now -Surgery consulted, performed bedside debridement on 05/17/2020 -Wound care -Continue antibiotic -Will need surgical removal of infected bone, given current condition, will likely not survive surgery -ID input appreciated   AKI on CKD Stage II>> improving Hypernatremia (likely hypotonic hypervolemic in setting of volume overload)  Hypokalemia -Monitor I&O's / urinary output -Follow BMP -Ensure adequate renal perfusion -Avoid nephrotoxic agents as able -Replace electrolytes as indicated -Pharmacy consulted for assistance and electrolyte replacement -Volume removal as able  Anemia Thrombocytopenia, likely in setting of sepsis -Monitor for S/Sx of bleeding -Trend CBC -SCD's for VTE Prophylaxis  -Transfuse for Hgb <7 -Transfuse Platelets for Platelet count <50 with active bleeding  Hypothyroidism -Continue Synthroid  TBI w/ Seizure disorder -Continue Tegretol     Pt is critically ill.  Prognosis is guarded.  High risk for cardiac arrest and death.  Recommend DNR/DNI status, and comfort care measures.    BEST PRACTICES DISPOSITION: ICU GOALS OF CARE: full code VTE PROPHYLAXIS: SCD's CONSULTS: ID, General Surgery UPDATES: No family at bedside during MD rounds 05/23/2020  Total critical care time: 35 minutes  C. Derrill Kay, MD [place]  [facility]   *This note was dictated using voice recognition software/Dragon.  Despite best efforts to proofread, errors can occur which can change the meaning.  Any change was purely unintentional.

## 2020-05-23 NOTE — Consult Note (Signed)
Pharmacy Antibiotic Note  Joanne [name] is a 48 y.o. female admitted on 05/15/2020 with bacteremia.  Pharmacy has been consulted for pip/tazo dosing. ID following. 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  7/24 Bcx 1 out 4 bottles: PEPTOSTREPTOCOCCUS SPECIES + PROTEUS MIRABILIS  Repeat cultures 7/27 no growth. Another set of cultures were obtained 7/30 and are pending.  Plan: ID following. Day 9 of total abx. Day 5 of pip/tazo. Continue Zosyn 3.375 g IV q8h extended infusion.  Height: 5\' 4"  (162.6 cm) Weight: 73.9 kg (162 lb 14.7 oz) IBW/kg (Calculated) : 54.7  Temp (24hrs), Avg:97.9 F (36.6 C), Min:97.5 F (36.4 C), Max:98.2 F (36.8 C)  Recent Labs  Lab 05/16/20 1716 05/17/20 0038 05/17/20 0927 05/17/20 1233 05/18/20 0047 05/18/20 0657 05/19/20 0409 05/19/20 0409 05/20/20 0019 05/20/20 0415 05/20/20 1013 05/21/20 0532 05/22/20 0417  WBC  --    < >  --   --   --    < > 20.2*  --  17.0*  --  10.8* 22.2* 14.5*  CREATININE 0.95   < >  --    < >  --    < > 0.78   < > 1.02* 0.98 1.09* 1.22* 1.21*  LATICACIDVEN 2.8*  --  2.7*  --  3.7*  --   --   --  2.0*  --  1.4  --   --    < > = values in this interval not displayed.    Estimated Creatinine Clearance: 54.8 mL/min (A) (by C-G formula based on SCr of 1.21 mg/dL (H)).    Allergies  Allergen Reactions  . Kaopectate  [Attapulgite] Itching  . Thiopental Itching  . Tomato     Antimicrobials this admission: 7/24 cefepime >> 7/27 7/27 meropenem >> 7/28 7/24 vancomycin >> 7/28  7/28 Zosyn >>   Microbiology results: 7/24 BCx: peptostreptococcus species, proteus mirabilis 7/27 BCx: NGTD 7/30 BCx: pending 7/25 MRSA PCR: negative  Thank you for allowing pharmacy to be a part of this patient's care.  8/25, PharmD, BCPS 05/23/2020 9:03 AM

## 2020-05-23 NOTE — Progress Notes (Signed)
Patient remains alert to self and she answers questions with yes, no, and the nod of her head.  Replete potassium and magnesium today.  Patient maintained B/P on Levo.  She is still on mechanical trach vent 35% Fio2 saturating in the mid 90's.  She received morphine for pain today IV push.  She had 3 B/M diarrhea.  Changed her bed 3 times and have turned her Q2 hrs.  N/G tube infusing Vital 1.5 at goal.  She can move her arms and assist with rolling and turning.  Called her Aunt who is her caretaker and explained her course of care.  Patient wound dressing change occurred 3 times today.  She has a stage 4 to her spine.  Patient foley performing well with good UOP.  Bicarb has been stopped.

## 2020-05-23 NOTE — Progress Notes (Signed)
Pharmacy Electrolyte Monitoring Consult:  Pharmacy consulted to assist in monitoring and replacing electrolytes in this 49 y.o. female admitted on 05/15/2020  Labs:  Sodium (mmol/L)  Date Value  05/22/2020 145   Potassium (mmol/L)  Date Value  05/23/2020 2.7 (LL)   Magnesium (mg/dL)  Date Value  38/18/2993 1.8   Phosphorus (mg/dL)  Date Value  71/69/6789 3.5   Calcium (mg/dL)  Date Value  38/07/1750 7.6 (L)   Albumin (g/dL)  Date Value  02/58/5277 2.54 (L)    49 year old female with h/o TBI, seizures, hypothyroidism, diabetes. Patient admitted to ICU and subsequently transferred to the floor. Rapid response overnight for hypoxia and patient readmitted to the ICU. Blood cultures with peptostreptococcus and proteus. 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  Assessment/Plan: Electrolytes have improved. Patient to start tube feeds today, will be at risk for refeeding syndrome.  Medical team ordered KCl 10 mEq x 4 IV.   All electrolytes with morning labs.  Paschal Dopp PharmD, BCPS Clinical Pharmacist 05/23/2020 9:04 AM

## 2020-05-23 NOTE — Progress Notes (Signed)
Spoke with NP concerning patients RR, 30-40 at times. Patient has an order for Precedex drip and also PRN Fentanyl IV push to maintain RASS and CPOT. NP recommends to try the PRN Fentanyl IV before restarting precedex

## 2020-05-24 DIAGNOSIS — N179 Acute kidney failure, unspecified: Secondary | ICD-10-CM | POA: Diagnosis not present

## 2020-05-24 DIAGNOSIS — A4159 Other Gram-negative sepsis: Secondary | ICD-10-CM | POA: Diagnosis not present

## 2020-05-24 DIAGNOSIS — L89154 Pressure ulcer of sacral region, stage 4: Secondary | ICD-10-CM | POA: Diagnosis not present

## 2020-05-24 DIAGNOSIS — R6521 Severe sepsis with septic shock: Secondary | ICD-10-CM | POA: Diagnosis not present

## 2020-05-24 LAB — CULTURE, RESPIRATORY W GRAM STAIN

## 2020-05-24 LAB — ABO/RH: ABO/RH(D): A POS

## 2020-05-24 LAB — RENAL FUNCTION PANEL
Albumin: 1.9 g/dL — ABNORMAL LOW (ref 3.5–5.0)
Anion gap: 5 (ref 5–15)
BUN: 13 mg/dL (ref 6–20)
CO2: 32 mmol/L (ref 22–32)
Calcium: 7 mg/dL — ABNORMAL LOW (ref 8.9–10.3)
Chloride: 107 mmol/L (ref 98–111)
Creatinine, Ser: 0.68 mg/dL (ref 0.44–1.00)
GFR calc Af Amer: >60 mL/min (ref >60)
GFR calc non Af Amer: >60 mL/min (ref >60)
Glucose, Bld: 139 mg/dL — ABNORMAL HIGH (ref 70–99)
Phosphorus: 1.8 mg/dL — ABNORMAL LOW (ref 2.5–4.6)
Potassium: 2.9 mmol/L — ABNORMAL LOW (ref 3.5–5.1)
Sodium: 144 mmol/L (ref 135–145)

## 2020-05-24 LAB — HEMOGLOBIN AND HEMATOCRIT, BLOOD
HCT: 17 % — ABNORMAL LOW (ref 36.0–46.0)
HCT: 20 % — ABNORMAL LOW (ref 36.0–46.0)
Hemoglobin: 5.7 g/dL — ABNORMAL LOW (ref 12.0–15.0)
Hemoglobin: 6.8 g/dL — ABNORMAL LOW (ref 12.0–15.0)

## 2020-05-24 LAB — CBC WITH DIFFERENTIAL/PLATELET
Abs Immature Granulocytes: 0.33 10*3/uL — ABNORMAL HIGH (ref 0.00–0.07)
Basophils Absolute: 0 10*3/uL (ref 0.0–0.1)
Basophils Relative: 0 %
Eosinophils Absolute: 0 10*3/uL (ref 0.0–0.5)
Eosinophils Relative: 0 %
HCT: 16.6 % — ABNORMAL LOW (ref 36.0–46.0)
Hemoglobin: 5.5 g/dL — ABNORMAL LOW (ref 12.0–15.0)
Immature Granulocytes: 3 %
Lymphocytes Relative: 11 %
Lymphs Abs: 1.3 10*3/uL (ref 0.7–4.0)
MCH: 28.1 pg (ref 26.0–34.0)
MCHC: 33.1 g/dL (ref 30.0–36.0)
MCV: 84.7 fL (ref 80.0–100.0)
Monocytes Absolute: 0.5 10*3/uL (ref 0.1–1.0)
Monocytes Relative: 4 %
Neutro Abs: 10.2 10*3/uL — ABNORMAL HIGH (ref 1.7–7.7)
Neutrophils Relative %: 82 %
Platelets: 97 10*3/uL — ABNORMAL LOW (ref 150–400)
RBC: 1.96 MIL/uL — ABNORMAL LOW (ref 3.87–5.11)
RDW: 18 % — ABNORMAL HIGH (ref 11.5–15.5)
WBC: 12.4 10*3/uL — ABNORMAL HIGH (ref 4.0–10.5)
nRBC: 2.5 % — ABNORMAL HIGH (ref 0.0–0.2)

## 2020-05-24 LAB — GLUCOSE, CAPILLARY
Glucose-Capillary: 120 mg/dL — ABNORMAL HIGH (ref 70–99)
Glucose-Capillary: 149 mg/dL — ABNORMAL HIGH (ref 70–99)
Glucose-Capillary: 181 mg/dL — ABNORMAL HIGH (ref 70–99)
Glucose-Capillary: 161 mg/dL — ABNORMAL HIGH (ref 70–99)
Glucose-Capillary: 148 mg/dL — ABNORMAL HIGH (ref 70–99)

## 2020-05-24 LAB — PREPARE RBC (CROSSMATCH)

## 2020-05-24 LAB — MAGNESIUM: Magnesium: 2.1 mg/dL (ref 1.7–2.4)

## 2020-05-24 MED ORDER — MIDODRINE HCL 5 MG PO TABS
10.0000 mg | ORAL_TABLET | Freq: Three times a day (TID) | ORAL | Status: DC
  Administered 2020-05-24 – 2020-06-10 (×49): 10 mg
  Filled 2020-05-24 (×46): qty 2

## 2020-05-24 MED ORDER — POTASSIUM PHOSPHATES 15 MMOLE/5ML IV SOLN
30.0000 mmol | Freq: Once | INTRAVENOUS | Status: AC
  Administered 2020-05-24: 30 mmol via INTRAVENOUS
  Filled 2020-05-24: qty 10

## 2020-05-24 MED ORDER — POTASSIUM CHLORIDE 20 MEQ PO PACK
40.0000 meq | PACK | Freq: Once | ORAL | Status: AC
  Administered 2020-05-24: 40 meq
  Filled 2020-05-24: qty 2

## 2020-05-24 MED ORDER — POTASSIUM CHLORIDE 20 MEQ PO PACK: 40.0000 meq | PACK | Freq: Once | ORAL | Status: DC

## 2020-05-24 MED ORDER — MORPHINE SULFATE (PF) 2 MG/ML IV SOLN
1.0000 mg | INTRAVENOUS | Status: DC | PRN
  Administered 2020-05-24 – 2020-06-10 (×21): 1 mg via INTRAVENOUS
  Filled 2020-05-24 (×23): qty 1

## 2020-05-24 MED ORDER — SODIUM CHLORIDE 0.9% IV SOLUTION: Freq: Once | INTRAVENOUS | Status: DC

## 2020-05-24 NOTE — Progress Notes (Signed)
CH visited pt. briefly while rounding on ICU; pt. on trach vent support, lying in bed, watching TV; Pt. looked at [facility] when he greeted her; [facility] offered prayer for pt.'s sense of divine presence and for relief from pain; Pt. attempted to relay an additional prayer request to [facility] but CH unable to read pt.'s lips.  CH remains available as needed.

## 2020-05-24 NOTE — Progress Notes (Addendum)
CRITICAL CARE PROGRESS  NOTE  BRIEF PATIENT DESCRIPTION:  49yo F with TBI , trach status, seizure disorder as well as multiple additional comorbid conditions admitted with Septic Shock due to Proteus bacteremia and peptostreptococcus in setting of Stage IV Sacral Decubitus with Osteomyelitis.  SIGNIFICANT EVENTS/STUDIES:  07/23: Pt admitted to stepdown unit with sepsis but remained housed in the ER pending bed availability  07/24: CT Pelvis revealed large sacral decubitus ulcer which has progressed significantly since prior study. Extension to the underlying sacrum and coccyx with evidence of osteomyelitis and bone destruction. No evidence of fluid collection or abscess. Stable midline ventral hernia as without evidence of bowel obstruction or strangulation. 07/24: Pt with hypotension PCCM team consulted to assist with management  7/26: General Surgery consulted, performed bedside debridement 7/26: Palliative Care consulted, pt's POA requests pt remain Full Code with aggressive medical interventions 7/27: ID consulted due to Proteus & peptostreptococcus  Bacteremia 7/27: 2D Echocardiogram: Left Ventricle: Left ventricular ejection fraction, by estimation, is 60  to 65%. The left ventricle has normal function. The left ventricle has no regional wall motion abnormalities. The left ventricular internal cavity size was normal in size. There is mild left ventricular hypertrophy. Left ventricular diastolic parameters  are indeterminate.  7/27: Weaned off Vasopressors 7/28: Transfer out to Med-Surg unit 7/29: RR called due to Hypoxia in setting of pulmonary edema; transfer to ICU; PCCM reconsulted 7/29: Cuffless shiley exchanged for Cuffed shiley, placed on vent; Hypotensive requiring vasopressors 7/30: Left Femoral CVC & Left Femoral A-line placed 8/01:A-line D/C  Antimicrobials this admission: 7/24 cefepime >> 7/27 7/27 meropenem >> 7/28 7/24 vancomycin >> 7/28  7/28 Zosyn >>  Microbiology  results: 7/24 BCx: peptostreptococcus species, proteus mirabilis 7/27 BCx: NGTD 7/30 BCx: NGTD 7/25 MRSA PCR: negative  CC Follow up septic shock and resp failure  HPI In ICU, on pressors, on vent Very poor prognosis   REVIEW OF SYSTEMS  PATIENT IS UNABLE TO PROVIDE COMPLETE REVIEW OF SYSTEM S DUE TO SEVERE CRITICAL ILLNESS AND ENCEPHALOPATHY  OBJECTIVE  VITAL SIGNS: Temp:  [97.2 F (36.2 C)-98.1 F (36.7 C)] 98.1 F (36.7 C) (07/31 1300) Pulse Rate:  [47-89] 69 (07/31 1300) Resp:  [20-41] 30 (07/31 1200) BP: (94-115)/(31-92) 102/48 (07/31 1300) SpO2:  [93 %-100 %] 97 % (07/31 1300) Arterial Line BP: (81-109)/(36-74) 87/43 (07/31 1300) FiO2 (%):  [35 %-45 %] 35 % (07/31 1200) Weight:  [67.7 kg] 67.7 kg (07/31 0500)  Vent Mode: PCV FiO2 (%):  [35 %] 35 % Set Rate:  [20 bmp] 20 bmp PEEP:  [5 cmH20] 5 cmH20 Plateau Pressure:  [31 cmH20-33 cmH20] 31 cmH20  PHYSICAL EXAMINATION:  GENERAL:critically ill appearing, +resp distress HEAD: Normocephalic, atraumatic.  EYES: Pupils equal, round, reactive to light.  No scleral icterus.  MOUTH: Moist mucosal membrane. NECK: Supple. No thyromegaly. No nodules. No JVD.  PULMONARY: +rhonchi, +wheezing CARDIOVASCULAR: S1 and S2. Regular rate and rhythm. No murmurs, rubs, or gallops.  GASTROINTESTINAL: Soft, nontender, -distended. Positive bowel sounds.  NEUROLOGIC: obtunded Musculoskeletal: Right paraplegia.  Diffuse muscle wasting Skin: Large sacral decubitus with bony destruction of the sacral spine present on admission     MEDICATION: Prior to Admission medications   Medication Sig Start Date End Date Taking? Authorizing Provider  acetaminophen (TYLENOL) 325 MG tablet Take 2 tablets (650 mg total) by mouth every 6 (six) hours as needed for mild pain or fever (or Fever >/= 101). 11/20/19  Yes Charise Killian, MD  aspirin 81 MG EC tablet aspirin  81 mg tablet,delayed release  Take 1 tablet every day by oral route.   Yes  [provider]  B Complex-C-E-Zn (B COMPLEX-C-E-ZINC) tablet Take by mouth.   Yes [provider]  carbamazepine (TEGRETOL XR) 100 MG 12 hr tablet Take 100 mg by mouth 2 (two) times daily.   Yes [provider]  cetirizine (ZYRTEC) 10 MG tablet Take by mouth.   Yes [provider]  cyanocobalamin 50 MCG tablet Take by mouth.   Yes [provider]  famotidine (PEPCID) 20 MG tablet Take 20 mg by mouth 2 (two) times daily.   Yes [provider]  ferrous sulfate 325 (65 FE) MG tablet Take 325 mg by mouth daily.   Yes [provider]  folic acid (FOLVITE) 1 MG tablet Take 1 mg by mouth daily. 07/28/19  Yes [provider]  levothyroxine (SYNTHROID) 100 MCG tablet Take 100 mcg by mouth daily. 07/18/19  Yes [provider]  loratadine (CLARITIN) 10 MG tablet Take 20 mg by mouth daily.   Yes [provider]  midodrine (PROAMATINE) 5 MG tablet Take 5 mg by mouth 3 (three) times daily with meals.  05/06/20  Yes [provider]  MULTIPLE VITAMIN PO Take 1 tablet by mouth daily.   Yes [provider]  Neomy-Bacit-Polymyx-Pramoxine 1 % OINT Apply topically. Apply to (L) Lower buttock/At fold typically every evening shift for wound. Cover with gauze/conversite; Change QD 'til healed.   Yes [provider]  oxyCODONE (OXY IR/ROXICODONE) 5 MG immediate release tablet Take 1 tablet (5 mg total) by mouth every 4 (four) hours as needed for moderate pain. 03/16/20  Yes Sreenath, Sudheer B, MD  polyethylene glycol powder (GLYCOLAX/MIRALAX) 17 GM/SCOOP powder Take 17 g by mouth daily.   Yes [provider]  Polyvinyl Alcohol-Povidone PF 1.4-0.6 % SOLN Place 1 drop into both eyes 3 (three) times daily.   Yes [provider]  SANTYL ointment Apply topically. 05/12/20  Yes [provider]  tamsulosin (FLOMAX) 0.4 MG CAPS capsule Take 0.4 mg by mouth at bedtime.  06/16/19  Yes [provider]  traMADol (ULTRAM) 50 MG tablet Take 25 mg by mouth 3 (three) times daily. 08/05/19  Yes [provider]   Scheduled Meds: . carbamazepine  50 mg Per Tube QID  . chlorhexidine gluconate (MEDLINE KIT)  15 mL Mouth Rinse BID  . Chlorhexidine Gluconate Cloth  6 each Topical Daily  . collagenase   Topical Daily  . docusate  100 mg Oral BID  . dronabinol  2.5 mg Oral BID AC  . feeding supplement (PROSource TF)  45 mL Per Tube Daily  . free water  200 mL Per Tube Q4H  . hydrocortisone sod succinate (SOLU-CORTEF) inj  50 mg Intravenous Q8H  . insulin aspart  0-9 Units Subcutaneous Q4H  . levothyroxine  50 mcg Intravenous Q0600  . mouth rinse  15 mL Mouth Rinse 10 times per day  . midodrine  10 mg Oral TID WC  . pantoprazole (PROTONIX) IV  40 mg Intravenous Q24H  . polyethylene glycol  17 g Oral Daily  . sodium chloride flush  10-40 mL Intracatheter Q12H   Continuous Infusions: . sodium chloride 10 mL/hr at 05/22/20 1154  . sodium chloride 250 mL (05/19/20 2252)  . dexmedetomidine (PRECEDEX) IV infusion Stopped (05/21/20 1512)  . feeding supplement (VITAL 1.5 CAL) 1,000 mL (05/23/20 1507)  . norepinephrine (LEVOPHED) Adult infusion 2.5 mcg/min (05/24/20 0700)  . piperacillin-tazobactam (ZOSYN)  IV 12.5 mL/hr at 05/24/20 0700  .  sodium bicarbonate  infusion 1000 mL Stopped (05/23/20 0919)   PRN Meds:.acetaminophen, bisacodyl, fentaNYL (SUBLIMAZE) injection, fentaNYL (SUBLIMAZE) injection, midazolam, midazolam, morphine injection, sodium chloride flush  Allergies  Allergen Reactions  . Kaopectate  [Attapulgite] Itching  . Thiopental Itching  . Tomato     Recent Labs  Lab 05/21/20 0532 05/21/20 0532 05/22/20 0417 05/23/20 0529 05/24/20 0510  NA 149*  --  145  --  144  K 3.3*   < > 3.3* 2.7* 2.9*  CL 123*  --  116*  --  107  CO2 17*  --  22  --  32  BUN 20  --  20  --  13  CREATININE 1.22*  --  1.21*  --  0.68  GLUCOSE 139*  --  253*  --  139*   < > =  values in this interval not displayed.   Recent Labs  Lab 05/21/20 0532 05/22/20 0417 05/24/20 0508  HGB 8.3* 7.1* 5.5*  HCT 24.0* 20.1* 16.6*  WBC 22.2* 14.5* 12.4*  PLT 114* 66* 97*   DG Chest Port 1 View  Result Date: 05/23/2020 CLINICAL DATA:  Traumatic brain injury. Chronic ventilator dependent respiratory failure. Follow-up pneumonia. EXAM: PORTABLE CHEST 1 VIEW COMPARISON:  05/20/2020 and earlier. FINDINGS: Endotracheal tube tip in satisfactory position projecting approximately 4 cm above the carina. Cardiac silhouette normal in size, unchanged. Since the examination 3 days ago, significant improvement in aeration in the RIGHT UPPER LOBE, though mild airspace consolidation persists. Interval worsening of airspace opacities in the BILATERAL lower lobes and lingula. Pulmonary vascularity normal. IMPRESSION: 1. Endotracheal tube tip in satisfactory position projecting approximately 4 cm above the carina. 2. Mixed response to therapy, with improvement in the RIGHT UPPER LOBE pneumonia since the examination 3 days ago, but interval worsening of pneumonia in the BILATERAL lower lobes and in the lingula. Electronically Signed   By: Evangeline Dakin M.D.   On: 05/23/2020 09:07    Media Information   Document Information  Photos    05/16/2020 17:05  Attached To:  Hospital Encounter on 05/15/20  Source Information       ASSESSMENT AND PLAN   Severe ACUTE Hypoxic and Hypercapnic Respiratory Failure due to pulm edema -continue Mechanical Ventilator support -continue Bronchodilator Therapy -Wean Fio2 and PEEP as tolerated -VAP/VENT bundle implementation -will perform SAT/SBT when respiratory parameters are met  Septic shock -use vasopressors to keep MAP>65 -stress dose steroids -Continue midodrine  ACUTE DIASTOLIC CARDIAC FAILURE- EF -oxygen as needed -Lasix as tolerated -follow up cardiac enzymes as indicated -follow up cardiology recs   Severe Sepsis due to Proteus  bacteremia and peptostreptococcus, in setting of Stage IV Sacral Decubitus with Osteomyelitis -ID input appreciated   AKI on CKD Stage II>> improving Hypernatremia (likely hypotonic hypervolemic in setting of volume overload) Hypokalemia    Critical Care Time devoted to patient care services described in this note is 32 minutes.   Overall, patient is critically ill, prognosis is guarded.  Patient with Multiorgan failure and at high risk for cardiac arrest and death.    Corrin Parker, M.D.  [facility]  Medical Director [place] Director [facility] Cardio-Pulmonary Department

## 2020-05-24 NOTE — Progress Notes (Signed)
Pharmacy Electrolyte Monitoring Consult:  Pharmacy consulted to assist in monitoring and replacing electrolytes in this 49 y.o. female admitted on 05/15/2020  Labs:  Sodium (mmol/L)  Date Value  05/24/2020 144   Potassium (mmol/L)  Date Value  05/24/2020 2.9 (L)   Magnesium (mg/dL)  Date Value  77/41/2878 2.1   Phosphorus (mg/dL)  Date Value  67/67/2094 1.8 (L)   Calcium (mg/dL)  Date Value  70/96/2836 7.0 (L)   Albumin (g/dL)  Date Value  62/94/7654 1.27 (L)    49 year old female with h/o TBI, seizures, hypothyroidism, diabetes. Patient admitted to ICU and subsequently transferred to the floor. Rapid response overnight for hypoxia and patient readmitted to the ICU. Blood cultures with peptostreptococcus and proteus. 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  Assessment/Plan: Potassium phos 30 mmol IV x 1 + potassium 40 mEq per tube x 1.  All electrolytes with morning labs.  Laureen Ochs, PharmD Clinical Pharmacist 05/24/2020 11:11 AM

## 2020-05-24 NOTE — Progress Notes (Signed)
Patient remains Alert and nods and shakes her head to confirm questions.  She has a low Hgb 5.5 this morning, recheck returned 5.7.  Physician ordered 1 unit PRBC to be delivered.  Consent was dual signed by Hulan Amato RN and Bennetta Laos after delivering health information about blood transfusion to the patient.  She nodded yes in response to confirming that she understood the information and consented to the transfusion.  Rectal tube in place.  Will changed dressing of her deep Stage IV injury to her sacrum.  UOP is WDL. Foley still patent.  A-line was removed last night per order.  She is currently on Levo 35mcg/min.

## 2020-05-24 NOTE — Progress Notes (Signed)
ID  Pt back in ICU More alert Received blood transfusion today. Low-dose Levophed  BP (!) 105/56    Pulse 72    Temp 98.9 F (37.2 C) (Oral)    Resp (!) 38    Ht 5\' 4"  (1.626 m)    Wt 73.9 kg    LMP  (LMP Unknown)    SpO2 97%    BMI 27.97 kg/m   CBC Latest Ref Rng & Units 05/24/2020 05/24/2020 05/24/2020  WBC 4.0 - 10.5 K/uL - - 12.4(H)  Hemoglobin 12.0 - 15.0 g/dL 07/24/2020) 5.7(L) 5.5(L)  Hematocrit 36 - 46 % 20.0(L) 17.0(L) 16.6(L)  Platelets 150 - 400 K/uL - - 97(L)    CMP Latest Ref Rng & Units 05/24/2020 05/23/2020 05/22/2020  Glucose 70 - 99 mg/dL 05/24/2020) - 355(D)  BUN 6 - 20 mg/dL 13 - 20  Creatinine 322(G - 1.00 mg/dL 2.54 - 2.70)  Sodium 135 - 145 mmol/L 144 - 145  Potassium 3.5 - 5.1 mmol/L 2.9(L) 2.7(LL) 3.3(L)  Chloride 98 - 111 mmol/L 107 - 116(H)  CO2 22 - 32 mmol/L 32 - 22  Calcium 8.9 - 10.3 mg/dL 7.0(L) - 7.6(L)  Total Protein 6.5 - 8.1 g/dL - - 5.4(L)  Total Bilirubin 0.3 - 1.2 mg/dL - - 1.0  Alkaline Phos 38 - 126 U/L - - 105  AST 15 - 41 U/L - - 21  ALT 0 - 44 U/L - - 14   Septic shock due to Proteus bacteremia and peptostreptococcus  Source  Is likely the Stage IV sacral decubituswith osteomyelitis of the underlying bones which has gotten worse since last 2 months. Currently on zosyn and leucocytosis much improved  Anemia- has received PRBC  Hypernatremia: resolved   Hypokalemia being corrected  Hypothyroidism on thyroxine.  AKI: Combination of prerenal and sepsis- resolved  History of traumatic brain injury  Recent history of Streptococcus pyogenes and E. coli bacteremia and was treated appropriately with IV antibiotics.  Discussed with her nurse

## 2020-05-24 NOTE — Progress Notes (Signed)
Patient remains alert.  Her Hgb reported 5.5 at change of shift.  Delivered 1 unit PRBC.  Patient complained of pain 7/10 twice today.  Delivered morphine IV push x2.  Changed interval to 1mg  q 2hr prn. Rectal tube maintains patency; output.  Continuing to receive tube feeds with no complications.  Foley remains in place and UOP good.  Vitals are good and WDL.  Levo stopped around 1800. Cousin visited earlier today.  Changed wound dressing on sacrum and lower back.  Delivered K+ for 2.9 potassium per pharmacy.

## 2020-05-25 DIAGNOSIS — E441 Mild protein-calorie malnutrition: Secondary | ICD-10-CM

## 2020-05-25 DIAGNOSIS — E43 Unspecified severe protein-calorie malnutrition: Secondary | ICD-10-CM

## 2020-05-25 LAB — CBC WITH DIFFERENTIAL/PLATELET
Abs Immature Granulocytes: 0.42 10*3/uL — ABNORMAL HIGH (ref 0.00–0.07)
Basophils Absolute: 0 10*3/uL (ref 0.0–0.1)
Basophils Relative: 0 %
Eosinophils Absolute: 0 10*3/uL (ref 0.0–0.5)
Eosinophils Relative: 0 %
HCT: 20.1 % — ABNORMAL LOW (ref 36.0–46.0)
Hemoglobin: 6.5 g/dL — ABNORMAL LOW (ref 12.0–15.0)
Immature Granulocytes: 4 %
Lymphocytes Relative: 10 %
Lymphs Abs: 1.2 10*3/uL (ref 0.7–4.0)
MCH: 29.3 pg (ref 26.0–34.0)
MCHC: 32.3 g/dL (ref 30.0–36.0)
MCV: 90.5 fL (ref 80.0–100.0)
Monocytes Absolute: 0.4 10*3/uL (ref 0.1–1.0)
Monocytes Relative: 3 %
Neutro Abs: 9.7 10*3/uL — ABNORMAL HIGH (ref 1.7–7.7)
Neutrophils Relative %: 83 %
Platelets: 116 10*3/uL — ABNORMAL LOW (ref 150–400)
RBC: 2.22 MIL/uL — ABNORMAL LOW (ref 3.87–5.11)
RDW: 17 % — ABNORMAL HIGH (ref 11.5–15.5)
WBC: 11.7 10*3/uL — ABNORMAL HIGH (ref 4.0–10.5)
nRBC: 1.8 % — ABNORMAL HIGH (ref 0.0–0.2)

## 2020-05-25 LAB — RENAL FUNCTION PANEL
Albumin: 2 g/dL — ABNORMAL LOW (ref 3.5–5.0)
Anion gap: 6 (ref 5–15)
BUN: 12 mg/dL (ref 6–20)
CO2: 31 mmol/L (ref 22–32)
Calcium: 7.3 mg/dL — ABNORMAL LOW (ref 8.9–10.3)
Chloride: 104 mmol/L (ref 98–111)
Creatinine, Ser: 0.56 mg/dL (ref 0.44–1.00)
GFR calc Af Amer: >60 mL/min (ref >60)
GFR calc non Af Amer: >60 mL/min (ref >60)
Glucose, Bld: 143 mg/dL — ABNORMAL HIGH (ref 70–99)
Phosphorus: 2.2 mg/dL — ABNORMAL LOW (ref 2.5–4.6)
Potassium: 3.8 mmol/L (ref 3.5–5.1)
Sodium: 141 mmol/L (ref 135–145)

## 2020-05-25 LAB — GLUCOSE, CAPILLARY
Glucose-Capillary: 120 mg/dL — ABNORMAL HIGH (ref 70–99)
Glucose-Capillary: 120 mg/dL — ABNORMAL HIGH (ref 70–99)
Glucose-Capillary: 138 mg/dL — ABNORMAL HIGH (ref 70–99)
Glucose-Capillary: 138 mg/dL — ABNORMAL HIGH (ref 70–99)
Glucose-Capillary: 138 mg/dL — ABNORMAL HIGH (ref 70–99)
Glucose-Capillary: 138 mg/dL — ABNORMAL HIGH (ref 70–99)
Glucose-Capillary: 141 mg/dL — ABNORMAL HIGH (ref 70–99)
Glucose-Capillary: 141 mg/dL — ABNORMAL HIGH (ref 70–99)
Glucose-Capillary: 154 mg/dL — ABNORMAL HIGH (ref 70–99)

## 2020-05-25 LAB — MAGNESIUM: Magnesium: 1.9 mg/dL (ref 1.7–2.4)

## 2020-05-25 LAB — PREPARE RBC (CROSSMATCH)

## 2020-05-25 LAB — HEMOGLOBIN AND HEMATOCRIT, BLOOD
HCT: 25.3 % — ABNORMAL LOW (ref 36.0–46.0)
Hemoglobin: 8.3 g/dL — ABNORMAL LOW (ref 12.0–15.0)

## 2020-05-25 MED ORDER — POTASSIUM & SODIUM PHOSPHATES 280-160-250 MG PO PACK
2.0000 | PACK | ORAL | Status: AC
  Administered 2020-05-25 (×3): 2
  Filled 2020-05-25 (×3): qty 2

## 2020-05-25 MED ORDER — SODIUM CHLORIDE 0.9% IV SOLUTION: Freq: Once | INTRAVENOUS | Status: DC

## 2020-05-25 MED ORDER — LACTATED RINGERS IV BOLUS
1000.0000 mL | Freq: Once | INTRAVENOUS | Status: AC
  Administered 2020-05-25: 1000 mL via INTRAVENOUS

## 2020-05-25 NOTE — Progress Notes (Addendum)
1600  Dr. Belia Heman informed of Patoents low urine output. No orders received. 1800 Remains on ventilator, in NSR, and tolerating tube feedings. No new results. Received I unit PRBCs  And hemaglobin increased from 6.5 to 8.3. Sacral dressing changed at 0900 and 1800. Santyl to open area saline moist gauze packed into sacral area covered with dry gauze and foam. Back dressing only small amount of Santyl cream then wet to dry with saline moist gauze and foam at 0900.

## 2020-05-25 NOTE — Progress Notes (Signed)
Daily Progress Note   Patient Name: Joanne [name]       Date: 05/25/2020 DOB: Oct 18, 1971  Age: 49 y.o. MRN#: [id number] Attending Physician: Flora Lipps, MD Primary Care Physician: Patient, No Pcp Per Admit Date: 05/15/2020  Reason for Consultation/Follow-up: Establishing goals of care  Subjective: Patient is resting in bed with feeding tube in place to nose. She tries to lip something I cannot understand. Attempted to reach guardian [name] unsuccessfully. Plans for a PEG Thursday.   Length of Stay: 10  Current Medications: Scheduled Meds:  . sodium chloride   Intravenous Once  . sodium chloride   Intravenous Once  . carbamazepine  50 mg Per Tube QID  . chlorhexidine gluconate (MEDLINE KIT)  15 mL Mouth Rinse BID  . Chlorhexidine Gluconate Cloth  6 each Topical Daily  . collagenase   Topical Daily  . docusate  100 mg Oral BID  . dronabinol  2.5 mg Oral BID AC  . feeding supplement (PROSource TF)  45 mL Per Tube Daily  . free water  200 mL Per Tube Q4H  . insulin aspart  0-9 Units Subcutaneous Q4H  . levothyroxine  50 mcg Intravenous Q0600  . mouth rinse  15 mL Mouth Rinse 10 times per day  . midodrine  10 mg Per Tube TID WC  . pantoprazole (PROTONIX) IV  40 mg Intravenous Q24H  . polyethylene glycol  17 g Oral Daily  . potassium & sodium phosphates  2 packet Per Tube Q4H  . sodium chloride flush  10-40 mL Intracatheter Q12H    Continuous Infusions: . sodium chloride 10 mL/hr at 05/22/20 1154  . sodium chloride 250 mL (05/25/20 0813)  . feeding supplement (VITAL 1.5 CAL) 1,000 mL (05/24/20 0736)  . norepinephrine (LEVOPHED) Adult infusion Stopped (05/24/20 1752)  . piperacillin-tazobactam (ZOSYN)  IV 3.375 g (05/25/20 0545)    PRN Meds: acetaminophen, bisacodyl, fentaNYL  (SUBLIMAZE) injection, fentaNYL (SUBLIMAZE) injection, midazolam, midazolam, morphine injection, sodium chloride flush  Physical Exam Pulmonary:     Comments: Trach and vent.  Neurological:     Mental Status: She is alert.             Vital Signs: BP 106/62   Pulse 79   Temp 98.4 F (36.9 C)   Resp (!) 22   Ht _0  (  1.626 m)   Wt 77.8 kg   LMP  (LMP Unknown)   SpO2 97%   BMI 29.44 kg/m  SpO2: SpO2: 97 % O2 Device: O2 Device: Ventilator O2 Flow Rate: O2 Flow Rate (L/min): 10 L/min  Intake/output summary:   Intake/Output Summary (Last 24 hours) at 05/25/2020 1258 Last data filed at 05/25/2020 1109 Gross per 24 hour  Intake 2858.67 ml  Output 995 ml  Net 1863.67 ml   LBM: Last BM Date: 05/25/20 Baseline Weight: Weight: 63.5 kg Most recent weight: Weight: 77.8 kg       Palliative Assessment/Data:      Patient Active Problem List   Diagnosis Date Noted  . Protein-calorie malnutrition, severe 05/20/2020  . Weakness   . Full code status   . Hypokalemia 05/16/2020  . Severe sepsis with septic shock ([place]) 05/15/2020  . Unstageable pressure ulcer of sacral region ([place]) 03/10/2020  . Goals of care, counseling/discussion   . Palliative care by specialist   . DNR (do not resuscitate) discussion   . Septic shock ([place]) 03/02/2020  . Hypernatremia 11/15/2019  . Thrombocytopenia ([place]) 11/15/2019  . AKI (acute kidney injury) ([place]) 11/15/2019  . HTN (hypertension) 11/15/2019  . Non-insulin dependent type 2 diabetes mellitus ([place]) 11/15/2019  . Hypothyroidism 11/15/2019  . Severe sepsis with acute organ dysfunction ([place]) 11/15/2019  . Left hemiplegia ([place]) 11/15/2019  . Pressure injury of skin 11/15/2019  . Aspiration pneumonia due to gastric secretions ([place])   . Acute kidney injury (AKI) with acute tubular necrosis (ATN) ([facility])   . Traumatic brain injury ([place])   . Acute on chronic respiratory failure with hypoxia ([place]) 06/06/2018  . Tracheostomy status ([place]) 06/06/2018   . Pneumonia due to Klebsiella pneumoniae ([place]) 06/06/2018  . Acute on chronic combined systolic and diastolic CHF (congestive heart failure) ([place]) 06/06/2018  . Sepsis secondary to UTI ([place]) 06/06/2018  . ARDS (adult respiratory distress syndrome) ([place]) 06/06/2018  . Acute on chronic respiratory failure with hypoxia ([place]) 06/06/2018  . Tracheostomy status ([place]) 06/06/2018    Palliative Care Assessment & Plan   Recommendations/Plan: Continue current care.     Code Status:    Code Status Orders  (From admission, onward)         Start     Ordered   05/15/20 1905  Full code  Continuous        05/15/20 1905        Code Status History    Date Active Date Inactive Code Status Order ID Comments User Context   03/02/2020 1721 03/16/2020 2328 Full Code [id number]  Flora Lipps, MD ED   11/15/2019 0253 11/21/2019 0411 Full Code [id number]  Athena Masse, MD ED   09/10/2019 1522 10/16/2019 1653 Full Code [id number]  Tommie Ard Inpatient   Advance Care Planning Activity      Prognosis:  Unable to determine    Care plan was discussed with GS  Thank you for allowing the Palliative Medicine Team to assist in the care of this patient.   Total Time 15 min Prolonged Time Billed  no       Greater than 50%  of this time was spent counseling and coordinating care related to the above assessment and plan.  Asencion Gowda, NP  Please contact Palliative Medicine Team phone at [phone] for questions and concerns.

## 2020-05-25 NOTE — Progress Notes (Signed)
Pharmacy Electrolyte Monitoring Consult:  Pharmacy consulted to assist in monitoring and replacing electrolytes in this 49 y.o. female admitted on 05/15/2020  Labs:  Sodium (mmol/L)  Date Value  05/25/2020 141   Potassium (mmol/L)  Date Value  05/25/2020 3.8   Magnesium (mg/dL)  Date Value  17/91/5056 1.9   Phosphorus (mg/dL)  Date Value  97/94/8016 2.2 (L)   Calcium (mg/dL)  Date Value  55/37/4827 7.3 (L)   Albumin (g/dL)  Date Value  07/86/7544 2.71 (L)    49 year old female with h/o TBI, seizures, hypothyroidism, diabetes. Patient admitted to ICU and subsequently transferred to the floor. Rapid response overnight for hypoxia and patient readmitted to the ICU. Blood cultures with peptostreptococcus and proteus. 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  Assessment/Plan: PHOS-NAK 2 packets q4h x 3 doses.  All electrolytes with morning labs.  Laureen Ochs, PharmD Clinical Pharmacist 05/25/2020 2:29 PM

## 2020-05-25 NOTE — Progress Notes (Addendum)
CRITICAL CARE PROGRESS  NOTE  BRIEF PATIENT DESCRIPTION:  49yo F with TBI , trach status, seizure disorder as well as multiple additional comorbid conditions admitted with Septic Shock due to Proteus bacteremia and peptostreptococcus in setting of Stage IV Sacral Decubitus with Osteomyelitis.  SIGNIFICANT EVENTS/STUDIES:  07/23: Pt admitted to stepdown unit with sepsis but remained housed in the ER pending bed availability  07/24: CT Pelvis revealed large sacral decubitus ulcer which has progressed significantly since prior study. Extension to the underlying sacrum and coccyx with evidence of osteomyelitis and bone destruction. No evidence of fluid collection or abscess. Stable midline ventral hernia as without evidence of bowel obstruction or strangulation. 07/24: Pt with hypotension PCCM team consulted to assist with management  7/26: General Surgery consulted, performed bedside debridement 7/26: Palliative Care consulted, pt's POA requests pt remain Full Code with aggressive medical interventions 7/27: ID consulted due to Proteus & peptostreptococcus  Bacteremia 7/27: 2D Echocardiogram: Left Ventricle: Left ventricular ejection fraction, by estimation, is 60  to 65%. The left ventricle has normal function. The left ventricle has no regional wall motion abnormalities. The left ventricular internal cavity size was normal in size. There is mild left ventricular hypertrophy. Left ventricular diastolic parameters  are indeterminate.  7/27: Weaned off Vasopressors 7/28: Transfer out to Med-Surg unit 7/29: RR called due to Hypoxia in setting of pulmonary edema; transfer to ICU; PCCM reconsulted 7/29: Cuffless shiley exchanged for Cuffed shiley, placed on vent; Hypotensive requiring vasopressors 7/30: Left Femoral CVC & Left Femoral A-line placed 8/01:A-line D/C  Antimicrobials this admission: 7/24 cefepime >> 7/27 7/27 meropenem >> 7/28 7/24 vancomycin >> 7/28  7/28 Zosyn >>  Microbiology  results: 7/24 BCx: peptostreptococcus species, proteus mirabilis 7/27 BCx: NGTD 7/30 BCx: NGTD 7/25 MRSA PCR: negative  CC Follow up shock and sepsis resp failure  HPI On vent, weaning off pressors Very poor prognosis  Septic shock due to Proteus bacteremiaand peptostreptococcus  Source Is likely the Stage IV sacral decubituswith osteomyelitis of the underlying bones whichhasgotten worse since last 2 months. Currently on zosyn and leucocytosis much improved   REVIEW OF SYSTEMS  PATIENT IS UNABLE TO PROVIDE COMPLETE REVIEW OF SYSTEM S DUE TO SEVERE CRITICAL ILLNESS AND ENCEPHALOPATHY   OBJECTIVE  VITAL SIGNS: Temp:  [97.2 F (36.2 C)-98.1 F (36.7 C)] 98.1 F (36.7 C) (07/31 1300) Pulse Rate:  [47-89] 69 (07/31 1300) Resp:  [20-41] 30 (07/31 1200) BP: (94-115)/(31-92) 102/48 (07/31 1300) SpO2:  [93 %-100 %] 97 % (07/31 1300) Arterial Line BP: (81-109)/(36-74) 87/43 (07/31 1300) FiO2 (%):  [35 %-45 %] 35 % (07/31 1200) Weight:  [67.7 kg] 67.7 kg (07/31 0500)  Vent Mode: PCV FiO2 (%):  [35 %] 35 % Set Rate:  [20 bmp] 20 bmp PEEP:  [5 cmH20] 5 cmH20 Pressure Support:  [5 cmH20] 5 cmH20 Plateau Pressure:  [33 cmH20] 33 cmH20  PHYSICAL EXAMINATION:  GENERAL:critically ill appearing, HEAD: Normocephalic, atraumatic.  EYES: Pupils equal, round, reactive to light.  No scleral icterus.  MOUTH: Moist mucosal membrane. NECK: Supple. No thyromegaly. No nodules. No JVD.  PULMONARY: +rhonchi, CARDIOVASCULAR: S1 and S2. Regular rate and rhythm. No murmurs, rubs, or gallops.  GASTROINTESTINAL: Soft, nontender, -distended. Positive bowel sounds.  Musculoskeletal: Right paraplegia.  Diffuse muscle wasting Skin: Large sacral decubitus with bony destruction of the sacral spine present on admission        MEDICATION: Prior to Admission medications   Medication Sig Start Date End Date Taking? Authorizing Provider  acetaminophen (TYLENOL) 325  MG tablet Take 2 tablets  (650 mg total) by mouth every 6 (six) hours as needed for mild pain or fever (or Fever >/= 101). 11/20/19  Yes Wyvonnia Dusky, MD  aspirin 81 MG EC tablet aspirin 81 mg tablet,delayed release  Take 1 tablet every day by oral route.   Yes [provider]  B Complex-C-E-Zn (B COMPLEX-C-E-ZINC) tablet Take by mouth.   Yes [provider]  carbamazepine (TEGRETOL XR) 100 MG 12 hr tablet Take 100 mg by mouth 2 (two) times daily.   Yes [provider]  cetirizine (ZYRTEC) 10 MG tablet Take by mouth.   Yes [provider]  cyanocobalamin 50 MCG tablet Take by mouth.   Yes [provider]  famotidine (PEPCID) 20 MG tablet Take 20 mg by mouth 2 (two) times daily.   Yes [provider]  ferrous sulfate 325 (65 FE) MG tablet Take 325 mg by mouth daily.   Yes [provider]  folic acid (FOLVITE) 1 MG tablet Take 1 mg by mouth daily. 07/28/19  Yes [provider]  levothyroxine (SYNTHROID) 100 MCG tablet Take 100 mcg by mouth daily. 07/18/19  Yes [provider]  loratadine (CLARITIN) 10 MG tablet Take 20 mg by mouth daily.   Yes [provider]  midodrine (PROAMATINE) 5 MG tablet Take 5 mg by mouth 3 (three) times daily with meals.  05/06/20  Yes [provider]  MULTIPLE VITAMIN PO Take 1 tablet by mouth daily.   Yes [provider]  Neomy-Bacit-Polymyx-Pramoxine 1 % OINT Apply topically. Apply to (L) Lower buttock/At fold typically every evening shift for wound. Cover with gauze/conversite; Change QD 'til healed.   Yes [provider]  oxyCODONE (OXY IR/ROXICODONE) 5 MG immediate release tablet Take 1 tablet (5 mg total) by mouth every 4 (four) hours as needed for moderate pain. 03/16/20  Yes Sreenath, Sudheer B, MD  polyethylene glycol powder (GLYCOLAX/MIRALAX) 17 GM/SCOOP powder Take 17 g by mouth daily.   Yes [provider]  Polyvinyl Alcohol-Povidone PF 1.4-0.6 % SOLN Place 1  drop into both eyes 3 (three) times daily.   Yes [provider]  SANTYL ointment Apply topically. 05/12/20  Yes [provider]  tamsulosin (FLOMAX) 0.4 MG CAPS capsule Take 0.4 mg by mouth at bedtime.  06/16/19  Yes [provider]  traMADol (ULTRAM) 50 MG tablet Take 25 mg by mouth 3 (three) times daily. 08/05/19  Yes [provider]   Scheduled Meds: . sodium chloride   Intravenous Once  . sodium chloride   Intravenous Once  . carbamazepine  50 mg Per Tube QID  . chlorhexidine gluconate (MEDLINE KIT)  15 mL Mouth Rinse BID  . Chlorhexidine Gluconate Cloth  6 each Topical Daily  . collagenase   Topical Daily  . docusate  100 mg Oral BID  . dronabinol  2.5 mg Oral BID AC  . feeding supplement (PROSource TF)  45 mL Per Tube Daily  . free water  200 mL Per Tube Q4H  . hydrocortisone sod succinate (SOLU-CORTEF) inj  50 mg Intravenous Q8H  . insulin aspart  0-9 Units Subcutaneous Q4H  . levothyroxine  50 mcg Intravenous Q0600  . mouth rinse  15 mL Mouth Rinse 10 times per day  . midodrine  10 mg Per Tube TID WC  . pantoprazole (PROTONIX) IV  40 mg Intravenous Q24H  . polyethylene glycol  17 g Oral Daily  . sodium chloride flush  10-40 mL  Intracatheter Q12H   Continuous Infusions: . sodium chloride 10 mL/hr at 05/22/20 1154  . sodium chloride 250 mL (05/19/20 2252)  . feeding supplement (VITAL 1.5 CAL) 1,000 mL (05/24/20 0736)  . norepinephrine (LEVOPHED) Adult infusion Stopped (05/24/20 1752)  . piperacillin-tazobactam (ZOSYN)  IV 3.375 g (05/25/20 0545)   PRN Meds:.acetaminophen, bisacodyl, fentaNYL (SUBLIMAZE) injection, fentaNYL (SUBLIMAZE) injection, midazolam, midazolam, morphine injection, sodium chloride flush  Allergies  Allergen Reactions  . Kaopectate  [Attapulgite] Itching  . Thiopental Itching  . Tomato     Recent Labs  Lab 05/22/20 0417 05/22/20 0417 05/23/20 0529 05/24/20 0510 05/25/20 0524  NA 145  --   --  144 141  K  3.3*   < > 2.7* 2.9* 3.8  CL 116*  --   --  107 104  CO2 22  --   --  32 31  BUN 20  --   --  13 12  CREATININE 1.21*  --   --  0.68 0.56  GLUCOSE 253*  --   --  139* 143*   < > = values in this interval not displayed.   Recent Labs  Lab 05/22/20 0417 05/22/20 0417 05/24/20 0508 05/24/20 0508 05/24/20 0714 05/24/20 1748 05/25/20 0524  HGB 7.1*   < > 5.5*   < > 5.7* 6.8* 6.5*  HCT 20.1*   < > 16.6*   < > 17.0* 20.0* 20.1*  WBC 14.5*  --  12.4*  --   --   --  11.7*  PLT 66*  --  97*  --   --   --  116*   < > = values in this interval not displayed.   No results found.  Media Information   Document Information  Photos    05/16/2020 17:05  Attached To:  Hospital Encounter on 05/15/20  Source Information       ASSESSMENT AND PLAN   Severe ACUTE Hypoxic and Hypercapnic Respiratory Failure due to pulm edema septic shock-  continue Mechanical Ventilator support -continue Bronchodilator Therapy -Wean Fio2 and PEEP as tolerated -VAP/VENT bundle implementation -will perform SAT/SBT when respiratory parameters are met  Septic shock -use vasopressors to keep MAP>55 as neede -stress dose steroids -Continue midodrine   Severe Sepsis/shock due to Proteus bacteremia and peptostreptococcus, in setting of Stage IV Sacral Decubitus with Osteomyelitis Source Is the Stage IV sacral decubituswith osteomyelitis of the underlying bones whichhasgotten worse since last 2 months. Currently on zosyn  -ID input appreciated   ELECTROLYTES -follow labs as needed -replace as needed -pharmacy consultation and following   GI GI PROPHYLAXIS as indicated  NUTRITIONAL STATUS DIET-->TF's as tolerated, plan for PEG Tube Constipation protocol as indicated    DVT/GI PRX ordered and assessed TRANSFUSIONS AS NEEDED MONITOR FSBS I Assessed the need for Labs I Assessed the need for Foley I Assessed the need for Central Venous Line Family Discussion when available I Assessed  the need for Mobilization I made an Assessment of medications to be adjusted accordingly Safety Risk assessment completed  CASE DISCUSSED IN MULTIDISCIPLINARY ROUNDS WITH ICU TEAM    Critical Care Time devoted to patient care services described in this note is 32 minutes.   Overall, patient is critically ill, prognosis is guarded.  Patient with Multiorgan failure and at high risk for cardiac arrest and death.    Corrin Parker, M.D.  [facility]  Medical Director [place] Director [facility] Cardio-Pulmonary Department

## 2020-05-25 NOTE — Consult Note (Signed)
Midge Minium, MD [facility]  [address], [address] [place], [place] [address] Phone: [phone] Fax : [phone]  Consultation  Referring Provider:     Dr. Tim Lair Primary Care Physician:  Patient, No Pcp Per Primary Gastroenterologist: Gentry Fitz         Reason for Consultation:     PEG tube placement  Date of Admission:  05/15/2020 Date of Consultation:  05/25/2020         HPI:   Joanne [name] is a 49 y.o. female with a complicated past history.  The patient has a history of traumatic brain injury status post tracheostomy and seizure disorders and was admitted with septic shock due to Proteus bacteria and Peptostreptococcus with a stage IV decubitus ulcer and osteomyelitis.  The patient's clinical course has included inability to maintain nutrition.  I am now asked to see the patient for a feeding tube placement.  Past Medical History:  Diagnosis Date  . Acute kidney injury (AKI) with acute tubular necrosis (ATN) (HCC)   . Acute on chronic combined systolic and diastolic CHF (congestive heart failure) (HCC) 06/06/2018  . Acute on chronic respiratory failure with hypoxia (HCC) 06/06/2018  . ARDS (adult respiratory distress syndrome) (HCC) 06/06/2018  . Aspiration pneumonia due to gastric secretions (HCC)   . Diabetes mellitus (HCC)   . History of traumatic brain injury   . Hypothyroidism   . Pneumonia due to Klebsiella pneumoniae (HCC) 06/06/2018  . Seizure disorder (HCC)   . Severe sepsis (HCC) 06/06/2018  . 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 Traumatic brain injury [facility])     Past Surgical History:  Procedure Laterality Date  . TRACHEOSTOMY      Prior to Admission medications   Medication Sig Start Date End Date Taking? Authorizing Provider  acetaminophen (TYLENOL) 325 MG tablet Take 2 tablets (650 mg total) by mouth every 6 (six) hours as needed for mild pain or fever (or Fever >/= 101). 11/20/19  Yes Charise Killian, MD  aspirin 81 MG EC tablet aspirin 81 mg  tablet,delayed release  Take 1 tablet every day by oral route.   Yes [provider]  B Complex-C-E-Zn (B COMPLEX-C-E-ZINC) tablet Take by mouth.   Yes [provider]  carbamazepine (TEGRETOL XR) 100 MG 12 hr tablet Take 100 mg by mouth 2 (two) times daily.   Yes [provider]  cetirizine (ZYRTEC) 10 MG tablet Take by mouth.   Yes [provider]  cyanocobalamin 50 MCG tablet Take by mouth.   Yes [provider]  famotidine (PEPCID) 20 MG tablet Take 20 mg by mouth 2 (two) times daily.   Yes [provider]  ferrous sulfate 325 (65 FE) MG tablet Take 325 mg by mouth daily.   Yes [provider]  folic acid (FOLVITE) 1 MG tablet Take 1 mg by mouth daily. 07/28/19  Yes [provider]  levothyroxine (SYNTHROID) 100 MCG tablet Take 100 mcg by mouth daily. 07/18/19  Yes [provider]  loratadine (CLARITIN) 10 MG tablet Take 20 mg by mouth daily.   Yes [provider]  midodrine (PROAMATINE) 5 MG tablet Take 5 mg by mouth 3 (three) times daily with meals.  05/06/20  Yes [provider]  MULTIPLE VITAMIN PO Take 1 tablet by mouth daily.   Yes [provider]  Neomy-Bacit-Polymyx-Pramoxine 1 % OINT Apply topically. Apply to (L) Lower buttock/At fold typically every evening shift for wound. Cover with gauze/conversite; Change QD 'til healed.  Yes [provider]  oxyCODONE (OXY IR/ROXICODONE) 5 MG immediate release tablet Take 1 tablet (5 mg total) by mouth every 4 (four) hours as needed for moderate pain. 03/16/20  Yes Sreenath, Sudheer B, MD  polyethylene glycol powder (GLYCOLAX/MIRALAX) 17 GM/SCOOP powder Take 17 g by mouth daily.   Yes [provider]  Polyvinyl Alcohol-Povidone PF 1.4-0.6 % SOLN Place 1 drop into both eyes 3 (three) times daily.   Yes [provider]  SANTYL ointment Apply topically. 05/12/20  Yes [provider]  tamsulosin (FLOMAX) 0.4 MG  CAPS capsule Take 0.4 mg by mouth at bedtime.  06/16/19  Yes [provider]  traMADol (ULTRAM) 50 MG tablet Take 25 mg by mouth 3 (three) times daily. 08/05/19  Yes [provider]    Family History  Family history unknown: Yes     Social History   Tobacco Use  . Smoking status: Never Smoker  . Smokeless tobacco: Never Used  Substance Use Topics  . Alcohol use: Not Currently  . Drug use: Not Currently    Allergies as of 05/15/2020 - Review Complete 05/15/2020  Allergen Reaction Noted  . Kaopectate  [attapulgite] Itching 10/19/2012  . Thiopental Itching 10/19/2012  . Tomato  11/14/2019    Review of Systems:    All systems reviewed and negative except where noted in HPI.   Physical Exam:  Vital signs in last 24 hours: Temp:  [98.2 F (36.8 C)-99.3 F (37.4 C)] 98.4 F (36.9 C) (08/03 1200) Pulse Rate:  [47-96] 79 (08/03 1200) Resp:  [22-40] 22 (08/03 1100) BP: (94-122)/(52-79) 106/62 (08/03 1200) SpO2:  [83 %-100 %] 97 % (08/03 1200) FiO2 (%):  [35 %] 35 % (08/03 1109) Weight:  [77.8 kg] 77.8 kg (08/03 0452) Last BM Date: 05/25/20 General:   Follows you with her eyes otherwise not responsive Head: Without signs of acute trauma Eyes:   No icterus.   Conjunctiva pink. PERRLA. Ears: Unable to assess. Neck:  Supple; no masses or thyroidomegaly Lungs: Respirations even and unlabored. Lungs clear to auscultation bilaterally.   No wheezes, crackles, or rhonchi.  Heart:  Regular rate and rhythm;  Without murmur, clicks, rubs or gallops Abdomen:  Soft, nondistended, nontender. Normal bowel sounds. No appreciable masses or hepatomegaly.  No rebound or guarding.  A large ventral hernia is present Rectal:  Not performed. Msk:  Symmetrical without gross deformities.    Extremities:  Without edema, cyanosis or clubbing. Neurologic: Unable to assess Skin:  Intact without significant lesions or rashes. Cervical Nodes:  No significant cervical adenopathy. Psych:  Nonverbal.  LAB RESULTS: Recent Labs    05/24/20 0508 05/24/20 0508 05/24/20 0714 05/24/20 1748 05/25/20 0524  WBC 12.4*  --   --   --  11.7*  HGB 5.5*   < > 5.7* 6.8* 6.5*  HCT 16.6*   < > 17.0* 20.0* 20.1*  PLT 97*  --   --   --  116*   < > = values in this interval not displayed.   BMET Recent Labs    05/23/20 0529 05/24/20 0510 05/25/20 0524  NA  --  144 141  K 2.7* 2.9* 3.8  CL  --  107 104  CO2  --  32 31  GLUCOSE  --  139* 143*  BUN  --  13 12  CREATININE  --  0.68 0.56  CALCIUM  --  7.0* 7.3*   LFT Recent Labs    05/25/20 0524  ALBUMIN 2.0*  PT/INR No results for input(s): LABPROT, INR in the last 72 hours.  STUDIES: No results found.    Impression / Plan:   Assessment: Principal Problem:   Severe sepsis with septic shock (HCC) Active Problems:   Acute on chronic combined systolic and diastolic CHF (congestive heart failure) (HCC)   Tracheostomy status (HCC)   Traumatic brain injury (HCC)   Hypernatremia   AKI (acute kidney injury) (HCC)   Hypothyroidism   Hypokalemia   Weakness   Full code status   Protein-calorie malnutrition, severe   Joanne [name] is a 49 y.o. y/o female with multiple medical problems including septic shock and a history of traumatic brain injury.  I am now being asked to see the patient for PEG tube placement.  Plan: I spoken to the patient's legal guardian and has been explained the risks and benefits including bleeding infection perforation and death.  The caregiver and power of attorney states that she understands the risks and benefits and would like to proceed with a PEG tube placement.  The PEG tube placement will be planned for August 5.   Thank you for involving me in the care of this patient.      LOS: 10 days   Midge Minium, MD  05/25/2020, 1:23 PM Pager [phone] 7am-5pm  Check AMION for 5pm -7am coverage and on weekends   Note: This dictation was prepared with Dragon dictation along with smaller  phrase technology. Any transcriptional errors that result from this process are unintentional.

## 2020-05-26 DIAGNOSIS — Z7189 Other specified counseling: Secondary | ICD-10-CM | POA: Diagnosis not present

## 2020-05-26 DIAGNOSIS — Z515 Encounter for palliative care: Secondary | ICD-10-CM | POA: Diagnosis not present

## 2020-05-26 LAB — TYPE AND SCREEN
ABO/RH(D): A POS
Antibody Screen: NEGATIVE
Unit division: 0
Unit division: 0

## 2020-05-26 LAB — RENAL FUNCTION PANEL
Albumin: 2.1 g/dL — ABNORMAL LOW (ref 3.5–5.0)
Anion gap: 7 (ref 5–15)
BUN: 13 mg/dL (ref 6–20)
CO2: 30 mmol/L (ref 22–32)
Calcium: 7.6 mg/dL — ABNORMAL LOW (ref 8.9–10.3)
Chloride: 105 mmol/L (ref 98–111)
Creatinine, Ser: 0.53 mg/dL (ref 0.44–1.00)
GFR calc Af Amer: >60 mL/min (ref >60)
GFR calc non Af Amer: >60 mL/min (ref >60)
Glucose, Bld: 147 mg/dL — ABNORMAL HIGH (ref 70–99)
Phosphorus: 2.5 mg/dL (ref 2.5–4.6)
Potassium: 3.4 mmol/L — ABNORMAL LOW (ref 3.5–5.1)
Sodium: 142 mmol/L (ref 135–145)

## 2020-05-26 LAB — GLUCOSE, CAPILLARY
Glucose-Capillary: 102 mg/dL — ABNORMAL HIGH (ref 70–99)
Glucose-Capillary: 103 mg/dL — ABNORMAL HIGH (ref 70–99)
Glucose-Capillary: 107 mg/dL — ABNORMAL HIGH (ref 70–99)
Glucose-Capillary: 116 mg/dL — ABNORMAL HIGH (ref 70–99)
Glucose-Capillary: 126 mg/dL — ABNORMAL HIGH (ref 70–99)
Glucose-Capillary: 143 mg/dL — ABNORMAL HIGH (ref 70–99)
Glucose-Capillary: 92 mg/dL (ref 70–99)

## 2020-05-26 LAB — CBC WITH DIFFERENTIAL/PLATELET
Abs Immature Granulocytes: 0.35 10*3/uL — ABNORMAL HIGH (ref 0.00–0.07)
Basophils Absolute: 0 10*3/uL (ref 0.0–0.1)
Basophils Relative: 0 %
Eosinophils Absolute: 0.1 10*3/uL (ref 0.0–0.5)
Eosinophils Relative: 1 %
HCT: 27 % — ABNORMAL LOW (ref 36.0–46.0)
Hemoglobin: 9.3 g/dL — ABNORMAL LOW (ref 12.0–15.0)
Immature Granulocytes: 3 %
Lymphocytes Relative: 13 %
Lymphs Abs: 1.5 10*3/uL (ref 0.7–4.0)
MCH: 30.6 pg (ref 26.0–34.0)
MCHC: 34.4 g/dL (ref 30.0–36.0)
MCV: 88.8 fL (ref 80.0–100.0)
Monocytes Absolute: 0.3 10*3/uL (ref 0.1–1.0)
Monocytes Relative: 3 %
Neutro Abs: 9 10*3/uL — ABNORMAL HIGH (ref 1.7–7.7)
Neutrophils Relative %: 80 %
Platelets: 131 10*3/uL — ABNORMAL LOW (ref 150–400)
RBC: 3.04 MIL/uL — ABNORMAL LOW (ref 3.87–5.11)
RDW: 18.6 % — ABNORMAL HIGH (ref 11.5–15.5)
WBC: 11.3 10*3/uL — ABNORMAL HIGH (ref 4.0–10.5)
nRBC: 1.7 % — ABNORMAL HIGH (ref 0.0–0.2)

## 2020-05-26 LAB — BPAM RBC
Blood Product Expiration Date: 202108202359
Blood Product Expiration Date: 202108202359
ISSUE DATE / TIME: 202108021216
ISSUE DATE / TIME: 202108030822
Unit Type and Rh: 6200
Unit Type and Rh: 6200

## 2020-05-26 LAB — CULTURE, BLOOD (ROUTINE X 2)
Culture: NO GROWTH
Culture: NO GROWTH
Special Requests: ADEQUATE
Special Requests: ADEQUATE

## 2020-05-26 LAB — MAGNESIUM: Magnesium: 2 mg/dL (ref 1.7–2.4)

## 2020-05-26 MED ORDER — POTASSIUM CHLORIDE 20 MEQ/15ML (10%) PO SOLN
40.0000 meq | Freq: Once | ORAL | Status: AC
  Administered 2020-05-26: 40 meq
  Filled 2020-05-26: qty 30

## 2020-05-26 MED ORDER — JUVEN PO PACK
1.0000 | PACK | Freq: Two times a day (BID) | ORAL | Status: DC
  Administered 2020-05-26 – 2020-06-10 (×29): 1

## 2020-05-26 NOTE — TOC Progression Note (Addendum)
Transition of Care [facility]) - Progression Note    Patient Details  Name: Joanne [name] MRN: [id number] Date of Birth: 26-Aug-1971  Transition of Care [facility]) CM/SW Contact  Margarito Liner, LCSW Phone Number: 05/26/2020, 2:22 PM  Clinical Narrative: Patient has a bed offer from [facility] in [place]. Left voicemail for admissions coordinator. Will confirm they can meet her needs when they call back.    4:04 pm: [employer] again. Did not leave a second voicemail.  Expected Discharge Plan: Skilled Nursing Facility Barriers to Discharge: Continued Medical Work up  Expected Discharge Plan and Services Expected Discharge Plan: Skilled Nursing Facility     Post Acute Care Choice: Skilled Nursing Facility Living arrangements for the past 2 months: Skilled Nursing Facility                                       Social Determinants of Health (SDOH) Interventions    Readmission Risk Interventions Readmission Risk Prevention Plan 03/10/2020 03/03/2020  Transportation Screening Complete Complete  PCP or Specialist Appt within 3-5 Days Complete Complete  HRI or Home Care Consult Complete Complete  Social Work Consult for Recovery Care Planning/Counseling Complete -  Palliative Care Screening Not Applicable Complete  Medication Review Oceanographer) Complete Complete  Some recent data might be hidden
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
## 2020-05-26 NOTE — Progress Notes (Signed)
Pharmacy Electrolyte Monitoring Consult:  Pharmacy consulted to assist in monitoring and replacing electrolytes in this 49 y.o. female admitted on 05/15/2020  Labs:  Sodium (mmol/L)  Date Value  05/26/2020 142   Potassium (mmol/L)  Date Value  05/26/2020 3.4 (L)   Magnesium (mg/dL)  Date Value  83/72/9021 2.0   Phosphorus (mg/dL)  Date Value  11/55/2080 2.5   Calcium (mg/dL)  Date Value  22/33/6122 7.6 (L)   Albumin (g/dL)  Date Value  44/97/5300 2.38 (L)    49 year old female with h/o TBI, seizures, hypothyroidism, diabetes. Patient admitted to ICU and subsequently transferred to the floor. Rapid response overnight for hypoxia and patient readmitted to the ICU. Blood cultures with peptostreptococcus and proteus. 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  Assessment/Plan: Patient received potassium 40 mEq per tube this morning.  All electrolytes with morning labs.  Laureen Ochs, PharmD Clinical Pharmacist 05/26/2020 12:16 PM

## 2020-05-27 ENCOUNTER — Inpatient Hospital Stay: Payer: Medicare PPO | Admitting: Anesthesiology

## 2020-05-27 ENCOUNTER — Encounter: Payer: Self-pay | Admitting: Internal Medicine

## 2020-05-27 ENCOUNTER — Encounter
Admission: EM | Disposition: A | Discharge status: Skilled Nursing Facility | Payer: Self-pay | Source: Skilled Nursing Facility | Attending: Internal Medicine

## 2020-05-27 HISTORY — PX: PEG PLACEMENT: SHX5437

## 2020-05-27 LAB — GLUCOSE, CAPILLARY
Glucose-Capillary: 92 mg/dL (ref 70–99)
Glucose-Capillary: 121 mg/dL — ABNORMAL HIGH (ref 70–99)
Glucose-Capillary: 74 mg/dL (ref 70–99)
Glucose-Capillary: 65 mg/dL — ABNORMAL LOW (ref 70–99)

## 2020-05-27 LAB — CBC WITH DIFFERENTIAL/PLATELET
Abs Immature Granulocytes: 0.16 10*3/uL — ABNORMAL HIGH (ref 0.00–0.07)
Basophils Absolute: 0 10*3/uL (ref 0.0–0.1)
Basophils Relative: 0 %
Eosinophils Absolute: 0.2 10*3/uL (ref 0.0–0.5)
Eosinophils Relative: 2 %
HCT: 26.2 % — ABNORMAL LOW (ref 36.0–46.0)
Hemoglobin: 8.6 g/dL — ABNORMAL LOW (ref 12.0–15.0)
Immature Granulocytes: 2 %
Lymphocytes Relative: 12 %
Lymphs Abs: 0.9 10*3/uL (ref 0.7–4.0)
MCH: 30.4 pg (ref 26.0–34.0)
MCHC: 32.8 g/dL (ref 30.0–36.0)
MCV: 92.6 fL (ref 80.0–100.0)
Monocytes Absolute: 0.2 10*3/uL (ref 0.1–1.0)
Monocytes Relative: 2 %
Neutro Abs: 6 10*3/uL (ref 1.7–7.7)
Neutrophils Relative %: 82 %
Platelets: 150 10*3/uL (ref 150–400)
RBC: 2.83 MIL/uL — ABNORMAL LOW (ref 3.87–5.11)
RDW: 19.9 % — ABNORMAL HIGH (ref 11.5–15.5)
WBC: 7.4 10*3/uL (ref 4.0–10.5)
nRBC: 0.7 % — ABNORMAL HIGH (ref 0.0–0.2)

## 2020-05-27 LAB — RENAL FUNCTION PANEL
Albumin: 1.8 g/dL — ABNORMAL LOW (ref 3.5–5.0)
Anion gap: 6 (ref 5–15)
BUN: 10 mg/dL (ref 6–20)
CO2: 30 mmol/L (ref 22–32)
Calcium: 7.7 mg/dL — ABNORMAL LOW (ref 8.9–10.3)
Chloride: 105 mmol/L (ref 98–111)
Creatinine, Ser: 0.44 mg/dL (ref 0.44–1.00)
GFR calc Af Amer: >60 mL/min (ref >60)
GFR calc non Af Amer: >60 mL/min (ref >60)
Glucose, Bld: 96 mg/dL (ref 70–99)
Phosphorus: 2.7 mg/dL (ref 2.5–4.6)
Potassium: 4.3 mmol/L (ref 3.5–5.1)
Sodium: 141 mmol/L (ref 135–145)

## 2020-05-27 LAB — MAGNESIUM: Magnesium: 1.9 mg/dL (ref 1.7–2.4)

## 2020-05-27 SURGERY — INSERTION, PEG TUBE
Anesthesia: General

## 2020-05-27 MED ORDER — FENTANYL CITRATE (PF) 100 MCG/2ML IJ SOLN
INTRAMUSCULAR | Status: AC
  Filled 2020-05-27: qty 2

## 2020-05-27 MED ORDER — PHENYLEPHRINE HCL (PRESSORS) 10 MG/ML IV SOLN
INTRAVENOUS | Status: DC | PRN
  Administered 2020-05-27: 100 ug via INTRAVENOUS
  Administered 2020-05-27: 200 ug via INTRAVENOUS
  Administered 2020-05-27: 100 ug via INTRAVENOUS
  Administered 2020-05-27: 200 ug via INTRAVENOUS
  Administered 2020-05-27 (×2): 100 ug via INTRAVENOUS

## 2020-05-27 MED ORDER — PROPOFOL 10 MG/ML IV BOLUS
INTRAVENOUS | Status: AC
  Filled 2020-05-27: qty 20

## 2020-05-27 MED ORDER — EPHEDRINE SULFATE 50 MG/ML IJ SOLN
INTRAMUSCULAR | Status: DC | PRN
  Administered 2020-05-27 (×3): 5 mg via INTRAVENOUS

## 2020-05-27 MED ORDER — FENTANYL CITRATE (PF) 100 MCG/2ML IJ SOLN
12.5000 ug | Freq: Once | INTRAMUSCULAR | Status: AC
  Administered 2020-05-27: 12.5 ug via INTRAVENOUS
  Filled 2020-05-27: qty 2

## 2020-05-27 MED ORDER — PROPOFOL 10 MG/ML IV BOLUS
INTRAVENOUS | Status: DC | PRN
  Administered 2020-05-27: 30 mg via INTRAVENOUS
  Administered 2020-05-27: 20 mg via INTRAVENOUS
  Administered 2020-05-27: 40 mg via INTRAVENOUS
  Administered 2020-05-27: 30 mg via INTRAVENOUS
  Administered 2020-05-27: 20 mg via INTRAVENOUS

## 2020-05-27 NOTE — Transfer of Care (Signed)
Immediate Anesthesia Transfer of Care Note  Patient: Joanne [name]  Procedure(s) Performed: PERCUTANEOUS ENDOSCOPIC GASTROSTOMY (PEG) PLACEMENT (N/A )  Patient Location: ICU  Anesthesia Type:General  Level of Consciousness: sedated  Airway & Oxygen Therapy: Patient remains intubated per anesthesia plan and Patient placed on Ventilator (see vital sign flow sheet for setting)  Post-op Assessment: Post -op Vital signs reviewed and stable  Post vital signs: stable  Last Vitals:  Vitals Value Taken Time  BP 134/60   Temp    Pulse 103   Resp 30   SpO2 100     Last Pain:  Vitals:   05/27/20 0800  TempSrc:   PainSc: 0-No pain      Patients Stated Pain Goal: 0 (05/27/20 0800)  Complications: No complications documented.

## 2020-05-27 NOTE — Progress Notes (Signed)
CH visited pt. while rounding on ICU; Pt. lying down in bed awake; pt. expressed feeling in pain, repeating 'Ouch' over and over; she also expressed many times that she misses her mother.  CH provided supportive presence and listening for pt. and prayed for her physical and emotional pain; CH will relay pt.'s experience of pain to RN in case medication can be adjusted.

## 2020-05-27 NOTE — Anesthesia Postprocedure Evaluation (Signed)
Anesthesia Post Note  Patient: ANNIYAH MOOD  Procedure(s) Performed: PERCUTANEOUS ENDOSCOPIC GASTROSTOMY (PEG) PLACEMENT (N/A )  Patient location during evaluation: ICU Anesthesia Type: General Level of consciousness: sedated and patient remains intubated per anesthesia plan Pain management: pain level controlled Vital Signs Assessment: post-procedure vital signs reviewed and stable Respiratory status: spontaneous breathing, nonlabored ventilation, respiratory function stable and patient on ventilator - see flowsheet for VS Cardiovascular status: blood pressure returned to baseline and stable Postop Assessment: no apparent nausea or vomiting Anesthetic complications: no   No complications documented.   Last Vitals:  Vitals:   05/27/20 1105 05/27/20 1557  BP:    Pulse:    Resp:    Temp:    SpO2: 98% 97%    Last Pain:  Vitals:   05/27/20 0800  TempSrc:   PainSc: 0-No pain                 Corinda Gubler

## 2020-05-27 NOTE — Op Note (Signed)
[facility] Gastroenterology Patient Name: Joanne [name] Procedure Date: 05/27/2020 3:01 PM MRN: [id number] Account #: [id number] Date of Birth: May 18, 1971 Admit Type: Inpatient Age: 49 Room: [facility] ENDO ROOM 4 Gender: Female Note Status: Finalized Procedure:             Upper GI endoscopy Indications:           Dysphagia for PEG tube placement Providers:             Midge Minium MD, MD Referring MD:          none Medicines:             Propofol per Anesthesia Complications:         No immediate complications. Procedure:             Pre-Anesthesia Assessment:                        - Prior to the procedure, a History and Physical was                         performed, and patient medications and allergies were                         reviewed. The patient's tolerance of previous                         anesthesia was also reviewed. The risks and benefits                         of the procedure and the sedation options and risks                         were discussed with the patient. All questions were                         answered, and informed consent was obtained. Prior                         Anticoagulants: The patient has taken no previous                         anticoagulant or antiplatelet agents. ASA Grade                         Assessment: II - A patient with mild systemic disease.                         After reviewing the risks and benefits, the patient                         was deemed in satisfactory condition to undergo the                         procedure.                        After obtaining informed consent, the endoscope was  passed under direct vision. Throughout the procedure,                         the patient's blood pressure, pulse, and oxygen                         saturations were monitored continuously. The Endoscope                         was introduced through the mouth, and advanced to the                          second part of duodenum. The upper GI endoscopy was                         accomplished without difficulty. The patient tolerated                         the procedure well. Findings:      The examined esophagus was normal.      A scar was found in the gastric body.      The examined duodenum was normal.      There was no translumination of the abdomin to place a PEG tube. The was       also abdominal wall hernias seen. The procedure was aborted. Impression:            - Normal esophagus.                        - Scar in the gastric body.                        - Normal examined duodenum.                        - There was no translumination of the abdomin to place                         a PEG tube. The procedure was aborted.                        - No specimens collected. Recommendation:        - Recommend IR placement of PEG tube. Procedure Code(s):     --- Professional ---                        [phone], Esophagogastroduodenoscopy, flexible,                         transoral; diagnostic, including collection of                         specimen(s) by brushing or washing, when performed                         (separate procedure) Diagnosis Code(s):     --- Professional ---                        R13.10, Dysphagia, unspecified CPT copyright 2019 American Medical Association. All  rights reserved. The codes documented in this report are preliminary and upon coder review may  be revised to meet current compliance requirements. Midge Minium MD, MD 05/27/2020 3:52:53 PM This report has been signed electronically. Number of Addenda: 0 Note Initiated On: 05/27/2020 3:01 PM Estimated Blood Loss:  Estimated blood loss: none.      [facility]

## 2020-05-27 NOTE — Progress Notes (Signed)
Pharmacy Electrolyte Monitoring Consult:  Pharmacy consulted to assist in monitoring and replacing electrolytes in this 49 y.o. female admitted on 05/15/2020  Labs:  Sodium (mmol/L)  Date Value  05/27/2020 141   Potassium (mmol/L)  Date Value  05/27/2020 4.3   Magnesium (mg/dL)  Date Value  07/15/3006 1.9   Phosphorus (mg/dL)  Date Value  62/26/3335 2.7   Calcium (mg/dL)  Date Value  45/62/5638 7.7 (L)   Albumin (g/dL)  Date Value  93/73/4287 1.32 (L)    49 year old female with h/o TBI, seizures, hypothyroidism, diabetes. Patient admitted to ICU and subsequently transferred to the floor. Rapid response overnight for hypoxia and patient readmitted to the ICU. Blood cultures with peptostreptococcus and proteus. 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  Assessment/Plan: Electrolytes stable today. Plan for patient to get PEG tube.  All electrolytes with morning labs.  Laureen Ochs, PharmD Clinical Pharmacist 05/27/2020 9:43 AM

## 2020-05-27 NOTE — Anesthesia Preprocedure Evaluation (Addendum)
Anesthesia Evaluation  Patient identified by MRN, date of birth, ID band  Reviewed: Allergy & Precautions, NPO status , Patient's Chart, lab work & pertinent test results  History of Anesthesia Complications Negative for: history of anesthetic complications  Airway Mallampati: Trach  TM Distance: <3 FB   Mouth opening: Limited Mouth Opening  Dental  (+) Poor Dentition   Pulmonary pneumonia,    breath sounds clear to auscultation       Cardiovascular hypertension, +CHF   Rhythm:Regular Rate:Normal - Systolic murmurs    Neuro/Psych Seizures -,   Neuromuscular disease negative psych ROS   GI/Hepatic   Endo/Other  diabetesHypothyroidism   Renal/GU Renal disease     Musculoskeletal   Abdominal   Peds  Hematology   Anesthesia Other Findings Past Medical History: No date: Acute kidney injury (AKI) with acute tubular necrosis (ATN)  (HCC) 06/06/2018: Acute on chronic combined systolic and diastolic CHF  (congestive heart failure) (HCC) 06/06/2018: Acute on chronic respiratory failure with hypoxia (HCC) 06/06/2018: ARDS (adult respiratory distress syndrome) (HCC) No date: Aspiration pneumonia due to gastric secretions (HCC) No date: Diabetes mellitus (HCC) No date: History of traumatic brain injury No date: Hypothyroidism 06/06/2018: Pneumonia due to Klebsiella pneumoniae [facility]) No date: Seizure disorder (HCC) 06/06/2018: Severe sepsis (HCC) 06/06/2018: Tracheostomy status (HCC) No date: Traumatic brain injury (HCC)  Reproductive/Obstetrics                            Anesthesia Physical Anesthesia Plan  ASA: IV  Anesthesia Plan: General   Post-op Pain Management:    Induction: Intravenous  PONV Risk Score and Plan: 4 or greater and Propofol infusion, TIVA and Treatment may vary due to age or medical condition  Airway Management Planned: Tracheostomy  Additional Equipment: None  Intra-op  Plan:   Post-operative Plan: Post-operative intubation/ventilation  Informed Consent: I have reviewed the patients History and Physical, chart, labs and discussed the procedure including the risks, benefits and alternatives for the proposed anesthesia with the patient or authorized representative who has indicated his/her understanding and acceptance.     Dental advisory given  Plan Discussed with: CRNA and Surgeon  Anesthesia Plan Comments: (Discussed risks of anesthesia with legal guardian miss Patterson on phone, including PONV and Rare risks discussed as well, such as cardiorespiratory and neurological sequelae. She understands. She also confirmed full code status.)       Anesthesia Quick Evaluation

## 2020-05-27 NOTE — TOC Progression Note (Addendum)
Transition of Care [facility]) - Progression Note    Patient Details  Name: Joanne [name] MRN: [id number] Date of Birth: [birth year]  [facility] CM/SW Contact  Margarito Liner, LCSW Phone Number: 05/27/2020, 12:06 PM  Clinical Narrative: [facility] does not take trachs. Admissions coordinator encouraged CSW to call [facility] and [facility] in [place]. CSW spoke to [place] with [facility]. She confirmed they do take trachs. CSW notified her of plan for peg placement and that she is on a vent right now. She will review referral to see if they can meet patient's needs once she is off ventilator.   2:41 pm: Faxed referral to [facility] [employer].  Expected Discharge Plan: Skilled Nursing Facility Barriers to Discharge: Continued Medical Work up  Expected Discharge Plan and Services Expected Discharge Plan: Skilled Nursing Facility     Post Acute Care Choice: Skilled Nursing Facility Living arrangements for the past 2 months: Skilled Nursing Facility                                       Social Determinants of Health (SDOH) Interventions    Readmission Risk Interventions Readmission Risk Prevention Plan 03/10/2020 03/03/2020  Transportation Screening Complete Complete  PCP or Specialist Appt within 3-5 Days Complete Complete  HRI or Home Care Consult Complete Complete  Social Work Consult for Recovery Care Planning/Counseling Complete -  Palliative Care Screening Not Applicable Complete  Medication Review [employer]) Complete Complete  Some recent data might be hidden

## 2020-05-27 NOTE — Progress Notes (Signed)
CRITICAL CARE PROGRESS  NOTE  BRIEF PATIENT DESCRIPTION:  49yo F with TBI , trach status, seizure disorder as well as multiple additional comorbid conditions admitted with Septic Shock due to Proteus bacteremia and peptostreptococcus in setting of Stage IV Sacral Decubitus with Osteomyelitis.  SIGNIFICANT EVENTS/STUDIES:  07/23: Pt admitted to stepdown unit with sepsis but remained housed in the ER pending bed availability  07/24: CT Pelvis revealed large sacral decubitus ulcer which has progressed significantly since prior study. Extension to the underlying sacrum and coccyx with evidence of osteomyelitis and bone destruction. No evidence of fluid collection or abscess. Stable midline ventral hernia as without evidence of bowel obstruction or strangulation. 07/24: Pt with hypotension PCCM team consulted to assist with management  7/26: General Surgery consulted, performed bedside debridement 7/26: Palliative Care consulted, pt's POA requests pt remain Full Code with aggressive medical interventions 7/27: ID consulted due to Proteus & peptostreptococcus  Bacteremia 7/27: 2D Echocardiogram: Left Ventricle: Left ventricular ejection fraction, by estimation, is 60  to 65%. The left ventricle has normal function. The left ventricle has no regional wall motion abnormalities. The left ventricular internal cavity size was normal in size. There is mild left ventricular hypertrophy. Left ventricular diastolic parameters  are indeterminate.  7/27: Weaned off Vasopressors 7/28: Transfer out to Med-Surg unit 7/29: RR called due to Hypoxia in setting of pulmonary edema; transfer to ICU; PCCM reconsulted 7/29: Cuffless shiley exchanged for Cuffed shiley, placed on vent; Hypotensive requiring vasopressors 7/30: Left Femoral CVC & Left Femoral A-line placed 8/01:A-line D/C  Antimicrobials this admission: 7/24 cefepime >> 7/27 7/27 meropenem >> 7/28 7/24 vancomycin >> 7/28  7/28 Zosyn >>  Microbiology  results: 7/24 BCx: peptostreptococcus species, proteus mirabilis 7/27 BCx: NGTD 7/30 BCx: NGTD 7/25 MRSA PCR: negative  CC Follow up resp failure   HPI On vent Plan for PEG tube today    Septic shock due to Proteus bacteremiaand peptostreptococcus  Source Is likely the Stage IV sacral decubituswith osteomyelitis of the underlying bones whichhasgotten worse since last 2 months. Currently on zosyn and leucocytosis much improved    VITAL SIGNS: Temp:  [97.2 F (36.2 C)-98.1 F (36.7 C)] 98.1 F (36.7 C) (07/31 1300) Pulse Rate:  [47-89] 69 (07/31 1300) Resp:  [20-41] 30 (07/31 1200) BP: (94-115)/(31-92) 102/48 (07/31 1300) SpO2:  [93 %-100 %] 97 % (07/31 1300) Arterial Line BP: (81-109)/(36-74) 87/43 (07/31 1300) FiO2 (%):  [35 %-45 %] 35 % (07/31 1200) Weight:  [67.7 kg] 67.7 kg (07/31 0500)  Vent Mode: PCV FiO2 (%):  [40 %] 40 % Set Rate:  [16 bmp] 16 bmp PEEP:  [5 cmH20] 5 cmH20  PHYSICAL EXAMINATION: no change in Physical exam  GENERAL:critically ill appearing, +resp distress HEAD: Normocephalic, atraumatic.  EYES: Pupils equal, round, reactive to light.  No scleral icterus.  MOUTH: Moist mucosal membrane. NECK: Supple. No thyromegaly. No nodules. No JVD.  PULMONARY: +rhonchi,  CARDIOVASCULAR: S1 and S2. Regular rate and rhythm. No murmurs, rubs, or gallops.  GASTROINTESTINAL: Soft, nontender, -distended. Positive bowel sounds.  Musculoskeletal: Right paraplegia.  Diffuse muscle wasting Skin: Large sacral decubitus with bony destruction of the sacral spine present on admission            MEDICATION: Prior to Admission medications   Medication Sig Start Date End Date Taking? Authorizing Provider  acetaminophen (TYLENOL) 325 MG tablet Take 2 tablets (650 mg total) by mouth every 6 (six) hours as needed for mild pain or fever (or Fever >/= 101). 11/20/19  Yes Wyvonnia Dusky, MD  aspirin 81 MG EC tablet aspirin 81 mg tablet,delayed release   Take 1 tablet every day by oral route.   Yes [provider]  B Complex-C-E-Zn (B COMPLEX-C-E-ZINC) tablet Take by mouth.   Yes [provider]  carbamazepine (TEGRETOL XR) 100 MG 12 hr tablet Take 100 mg by mouth 2 (two) times daily.   Yes [provider]  cetirizine (ZYRTEC) 10 MG tablet Take by mouth.   Yes [provider]  cyanocobalamin 50 MCG tablet Take by mouth.   Yes [provider]  famotidine (PEPCID) 20 MG tablet Take 20 mg by mouth 2 (two) times daily.   Yes [provider]  ferrous sulfate 325 (65 FE) MG tablet Take 325 mg by mouth daily.   Yes [provider]  folic acid (FOLVITE) 1 MG tablet Take 1 mg by mouth daily. 07/28/19  Yes [provider]  levothyroxine (SYNTHROID) 100 MCG tablet Take 100 mcg by mouth daily. 07/18/19  Yes [provider]  loratadine (CLARITIN) 10 MG tablet Take 20 mg by mouth daily.   Yes [provider]  midodrine (PROAMATINE) 5 MG tablet Take 5 mg by mouth 3 (three) times daily with meals.  05/06/20  Yes [provider]  MULTIPLE VITAMIN PO Take 1 tablet by mouth daily.   Yes [provider]  Neomy-Bacit-Polymyx-Pramoxine 1 % OINT Apply topically. Apply to (L) Lower buttock/At fold typically every evening shift for wound. Cover with gauze/conversite; Change QD 'til healed.   Yes [provider]  oxyCODONE (OXY IR/ROXICODONE) 5 MG immediate release tablet Take 1 tablet (5 mg total) by mouth every 4 (four) hours as needed for moderate pain. 03/16/20  Yes Sreenath, Sudheer B, MD  polyethylene glycol powder (GLYCOLAX/MIRALAX) 17 GM/SCOOP powder Take 17 g by mouth daily.   Yes [provider]  Polyvinyl Alcohol-Povidone PF 1.4-0.6 % SOLN Place 1 drop into both eyes 3 (three) times daily.   Yes [provider]  SANTYL ointment Apply topically. 05/12/20  Yes [provider]  tamsulosin (FLOMAX) 0.4 MG CAPS capsule Take 0.4 mg  by mouth at bedtime.  06/16/19  Yes [provider]  traMADol (ULTRAM) 50 MG tablet Take 25 mg by mouth 3 (three) times daily. 08/05/19  Yes [provider]   Scheduled Meds: . carbamazepine  50 mg Per Tube QID  . chlorhexidine gluconate (MEDLINE KIT)  15 mL Mouth Rinse BID  . Chlorhexidine Gluconate Cloth  6 each Topical Daily  . collagenase   Topical Daily  . docusate  100 mg Oral BID  . feeding supplement (PROSource TF)  45 mL Per Tube Daily  . free water  200 mL Per Tube Q4H  . insulin aspart  0-9 Units Subcutaneous Q4H  . levothyroxine  50 mcg Intravenous Q0600  . mouth rinse  15 mL Mouth Rinse 10 times per day  . midodrine  10 mg Per Tube TID WC  . nutrition supplement (JUVEN)  1 packet Per Tube BID BM  . pantoprazole (PROTONIX) IV  40 mg Intravenous Q24H  . polyethylene glycol  17 g Oral Daily  . sodium chloride flush  10-40 mL Intracatheter Q12H   Continuous Infusions: . sodium chloride Stopped (05/25/20 1138)  . feeding supplement (VITAL 1.5 CAL) 50 mL/hr at 05/26/20 2000  . piperacillin-tazobactam (ZOSYN)  IV 3.375 g (05/27/20 0526)   PRN Meds:.acetaminophen, bisacodyl, fentaNYL (SUBLIMAZE) injection, midazolam, midazolam, morphine injection, sodium chloride flush  Allergies  Allergen Reactions  . Kaopectate  [Attapulgite] Itching  . Thiopental Itching  . Tomato     Recent Labs  Lab 05/25/20 0524 05/26/20 0449 05/27/20 0417  NA 141 142 141  K 3.8 3.4* 4.3  CL 104 105 105  CO2 31 30 30   BUN 12 13 10   CREATININE 0.56 0.53 0.44  GLUCOSE 143* 147* 96   Recent Labs  Lab 05/25/20 0524 05/25/20 0524 05/25/20 1631 05/26/20 0449 05/27/20 0416  HGB 6.5*   < > 8.3* 9.3* 8.6*  HCT 20.1*   < > 25.3* 27.0* 26.2*  WBC 11.7*  --   --  11.3* 7.4  PLT 116*  --   --  131* 150   < > = values in this interval not displayed.   No results found.  Media Information   Document Information  Photos    05/16/2020 17:05  Attached To:  Hospital  Encounter on 05/15/20  Source Information       ASSESSMENT AND PLAN   Severe ACUTE Hypoxic and Hypercapnic Respiratory Failure due to pulm edema septic shock-  Severe ACUTE Hypoxic and Hypercapnic Respiratory Failure Vent support  Septic shock -use vasopressors to keep MAP>55 as needed   Severe Sepsis/shock due to Proteus bacteremia and peptostreptococcus, in setting of Stage IV Sacral Decubitus with Osteomyelitis Source Is the Stage IV sacral decubituswith osteomyelitis of the underlying bones whichhasgotten worse since last 2 months. Currently on zosyn  -ID input appreciated  ELECTROLYTES -follow labs as needed -replace as needed -pharmacy consultation and following  Plan for PEG tube      DVT/GI PRX ordered and assessed TRANSFUSIONS AS NEEDED MONITOR FSBS I Assessed the need for Labs I Assessed the need for Foley I Assessed the need for Central Venous Line Family Discussion when available I Assessed the need for Mobilization I made an Assessment of medications to be adjusted accordingly Safety Risk assessment completed  CASE DISCUSSED IN MULTIDISCIPLINARY ROUNDS WITH ICU TEAM     Overall, patient is critically ill, prognosis is guarded.     Corrin Parker, M.D.  [facility]  Medical Director [place] Director [facility] Cardio-Pulmonary Department

## 2020-05-28 ENCOUNTER — Inpatient Hospital Stay: Payer: Medicare PPO

## 2020-05-28 ENCOUNTER — Encounter: Payer: Self-pay | Admitting: Gastroenterology

## 2020-05-28 DIAGNOSIS — R6521 Severe sepsis with septic shock: Secondary | ICD-10-CM | POA: Diagnosis not present

## 2020-05-28 DIAGNOSIS — D72829 Elevated white blood cell count, unspecified: Secondary | ICD-10-CM

## 2020-05-28 DIAGNOSIS — A4159 Other Gram-negative sepsis: Secondary | ICD-10-CM | POA: Diagnosis not present

## 2020-05-28 DIAGNOSIS — N179 Acute kidney failure, unspecified: Secondary | ICD-10-CM | POA: Diagnosis not present

## 2020-05-28 DIAGNOSIS — L89154 Pressure ulcer of sacral region, stage 4: Secondary | ICD-10-CM | POA: Diagnosis not present

## 2020-05-28 DIAGNOSIS — Z933 Colostomy status: Secondary | ICD-10-CM

## 2020-05-28 LAB — CBC WITH DIFFERENTIAL/PLATELET
Abs Immature Granulocytes: 0.07 10*3/uL (ref 0.00–0.07)
Basophils Absolute: 0 10*3/uL (ref 0.0–0.1)
Basophils Relative: 0 %
Eosinophils Absolute: 0.2 10*3/uL (ref 0.0–0.5)
Eosinophils Relative: 3 %
HCT: 25.1 % — ABNORMAL LOW (ref 36.0–46.0)
Hemoglobin: 8.2 g/dL — ABNORMAL LOW (ref 12.0–15.0)
Immature Granulocytes: 1 %
Lymphocytes Relative: 10 %
Lymphs Abs: 0.7 10*3/uL (ref 0.7–4.0)
MCH: 31.2 pg (ref 26.0–34.0)
MCHC: 32.7 g/dL (ref 30.0–36.0)
MCV: 95.4 fL (ref 80.0–100.0)
Monocytes Absolute: 0.3 10*3/uL (ref 0.1–1.0)
Monocytes Relative: 4 %
Neutro Abs: 5.2 10*3/uL (ref 1.7–7.7)
Neutrophils Relative %: 82 %
Platelets: 162 10*3/uL (ref 150–400)
RBC: 2.63 MIL/uL — ABNORMAL LOW (ref 3.87–5.11)
RDW: 20.2 % — ABNORMAL HIGH (ref 11.5–15.5)
WBC: 6.4 10*3/uL (ref 4.0–10.5)
nRBC: 0 % (ref 0.0–0.2)

## 2020-05-28 LAB — RENAL FUNCTION PANEL
Albumin: 1.6 g/dL — ABNORMAL LOW (ref 3.5–5.0)
Anion gap: 5 (ref 5–15)
BUN: 7 mg/dL (ref 6–20)
CO2: 29 mmol/L (ref 22–32)
Calcium: 7.3 mg/dL — ABNORMAL LOW (ref 8.9–10.3)
Chloride: 107 mmol/L (ref 98–111)
Creatinine, Ser: 0.45 mg/dL (ref 0.44–1.00)
GFR calc Af Amer: >60 mL/min (ref >60)
GFR calc non Af Amer: >60 mL/min (ref >60)
Glucose, Bld: 79 mg/dL (ref 70–99)
Phosphorus: 2.9 mg/dL (ref 2.5–4.6)
Potassium: 3.8 mmol/L (ref 3.5–5.1)
Sodium: 141 mmol/L (ref 135–145)

## 2020-05-28 LAB — GLUCOSE, CAPILLARY
Glucose-Capillary: 104 mg/dL — ABNORMAL HIGH (ref 70–99)
Glucose-Capillary: 129 mg/dL — ABNORMAL HIGH (ref 70–99)
Glucose-Capillary: 60 mg/dL — ABNORMAL LOW (ref 70–99)
Glucose-Capillary: 64 mg/dL — ABNORMAL LOW (ref 70–99)
Glucose-Capillary: 77 mg/dL (ref 70–99)
Glucose-Capillary: 77 mg/dL (ref 70–99)
Glucose-Capillary: 80 mg/dL (ref 70–99)
Glucose-Capillary: 89 mg/dL (ref 70–99)
Glucose-Capillary: 96 mg/dL (ref 70–99)

## 2020-05-28 LAB — MAGNESIUM: Magnesium: 2 mg/dL (ref 1.7–2.4)

## 2020-05-28 MED ORDER — DEXTROSE 50 % IV SOLN
INTRAVENOUS | Status: AC
  Filled 2020-05-28: qty 50

## 2020-05-28 MED ORDER — SODIUM CHLORIDE 0.9 % IV SOLN
3.0000 g | Freq: Four times a day (QID) | INTRAVENOUS | Status: DC
  Administered 2020-05-28 – 2020-06-10 (×52): 3 g via INTRAVENOUS
  Filled 2020-05-28 (×4): qty 3
  Filled 2020-05-28: qty 8
  Filled 2020-05-28 (×3): qty 3
  Filled 2020-05-28: qty 8
  Filled 2020-05-28: qty 3
  Filled 2020-05-28: qty 8
  Filled 2020-05-28: qty 3
  Filled 2020-05-28: qty 1
  Filled 2020-05-28 (×5): qty 3
  Filled 2020-05-28: qty 1
  Filled 2020-05-28 (×9): qty 3
  Filled 2020-05-28: qty 8
  Filled 2020-05-28 (×2): qty 3
  Filled 2020-05-28 (×2): qty 8
  Filled 2020-05-28: qty 3
  Filled 2020-05-28: qty 1
  Filled 2020-05-28 (×4): qty 3
  Filled 2020-05-28: qty 8
  Filled 2020-05-28 (×2): qty 3
  Filled 2020-05-28: qty 1
  Filled 2020-05-28 (×2): qty 3
  Filled 2020-05-28: qty 8
  Filled 2020-05-28 (×5): qty 3
  Filled 2020-05-28: qty 1
  Filled 2020-05-28: qty 8
  Filled 2020-05-28 (×5): qty 3
  Filled 2020-05-28: qty 8

## 2020-05-28 MED ORDER — DEXTROSE 10 % IV SOLN: INTRAVENOUS | Status: DC

## 2020-05-28 MED ORDER — NOREPINEPHRINE 16 MG/250ML-% IV SOLN
0.0000 ug/min | INTRAVENOUS | Status: DC
  Filled 2020-05-28: qty 250

## 2020-05-28 MED ORDER — DEXTROSE 50 % IV SOLN
25.0000 mL | Freq: Once | INTRAVENOUS | Status: AC
  Administered 2020-05-28: 25 mL via INTRAVENOUS

## 2020-05-28 MED ORDER — LACTATED RINGERS IV BOLUS
1000.0000 mL | Freq: Once | INTRAVENOUS | Status: AC
  Administered 2020-05-28: 1000 mL via INTRAVENOUS

## 2020-05-28 MED ORDER — DEXTROSE 50 % IV SOLN
INTRAVENOUS | Status: AC
  Administered 2020-05-28: 25 mL via INTRAVENOUS
  Filled 2020-05-28: qty 50

## 2020-05-28 NOTE — Progress Notes (Signed)
CBG: 60  Intervention: ICU NP notified. Gave 1/2 amp D50 per hypoglycemia protocol. Received new orders to hand D10 continuous IV fluid. See MAR.  CBG: 96

## 2020-05-28 NOTE — Progress Notes (Signed)
ID  Pt status quo Patient Vitals for the past 24 hrs:  BP Temp Temp src Pulse Resp SpO2 Weight  05/28/20 0703 -- -- -- -- -- 100 % --  05/28/20 0600 93/62 -- -- 88 -- 100 % --  05/28/20 0500 -- -- -- 96 -- 100 % --  05/28/20 0400 106/72 98.3 F (36.8 C) Oral 92 (!) 24 97 % 81.2 kg  05/28/20 0305 -- -- -- -- -- 97 % --  05/28/20 0300 93/65 -- -- 64 -- (!) 85 % --  05/28/20 0100 96/62 -- -- 96 -- 97 % --  05/28/20 0000 (!) 95/59 97.9 F (36.6 C) Oral 88 (!) 25 98 % --  05/27/20 2300 (!) 92/38 -- -- 91 -- 98 % --  05/27/20 2200 (!) 89/54 -- -- -- -- -- --  05/27/20 2034 -- -- -- -- -- 99 % --  05/27/20 2000 (!) 92/58 99 F (37.2 C) Oral 93 (!) 24 98 % --  05/27/20 1800 -- -- -- 99 -- 98 % --  05/27/20 1700 (!) 103/58 -- -- (!) 102 -- 95 % --  05/27/20 1600 105/72 98.4 F (36.9 C) Axillary 99 -- 98 % --  05/27/20 1557 -- -- -- -- -- 97 % --   Non verbal Opens eyes  Is not trying to talk NG tube feeds  CVC triple lumen left Foley catheter Chest b/l air entry HS s1s2  Labs CBC Latest Ref Rng & Units 05/28/2020 05/27/2020 05/26/2020  WBC 4.0 - 10.5 K/uL 6.4 7.4 11.3(H)  Hemoglobin 12.0 - 15.0 g/dL 8.2(L) 8.6(L) 9.3(L)  Hematocrit 36 - 46 % 25.1(L) 26.2(L) 27.0(L)  Platelets 150 - 400 K/uL 162 150 131(L)    CMP Latest Ref Rng & Units 05/28/2020 05/27/2020 05/26/2020  Glucose 70 - 99 mg/dL 79 96 454(U)  BUN 6 - 20 mg/dL 7 10 13   Creatinine 0.44 - 1.00 mg/dL 9.81 1.91  Sodium 135 - 145 mmol/L 141 141 142  Potassium 3.5 - 5.1 mmol/L 3.8 4.3 3.4(L)  Chloride 98 - 111 mmol/L 107 105 105  CO2 22 - 32 mmol/L 29 30 30   Calcium 8.9 - 10.3 mg/dL 7.3(L) 7.7(L) 7.6(L)  Total Protein 6.5 - 8.1 g/dL - - -  Total Bilirubin 0.3 - 1.2 mg/dL - - -  Alkaline Phos 38 - 126 U/L - - -  AST 15 - 41 U/L - - -  ALT 0 - 44 U/L - - -  Micro 7/24 Proteus and peptostreptococcus 05/21/20- BC- NG      Impression/recommendation Septic shock due to Proteus bacteremiaand peptostreptococcus  resolved Source Is likely the Stage IV sacral decubituswith osteomyelitis of the underlying bones whichhasgotten worse since last 2 months. Currently on zosyn and leucocytosis much improved IV antibiotics alone is not cure of  the stage IV decub with osteo A diverting colostomy,   surgical debridement with wound vac could help . If not will do long term po cephalosporin once on discharge to SNF- until then continue zosyn  Anemia- has received PRBC  Hypernatremia: resolved   Hypokalemia being corrected  Hypothyroidism on thyroxine.  AKI: Combination of prerenal and sepsis- resolved  History of traumatic brain injury  Recent history of Streptococcus pyogenes and E. coli bacteremia and was treated appropriately with IV antibiotics.  ID will follow her peripherally this weekend

## 2020-05-28 NOTE — Progress Notes (Signed)
Pharmacy Electrolyte Monitoring Consult:  Pharmacy consulted to assist in monitoring and replacing electrolytes in this 49 y.o. female admitted on 05/15/2020  Labs:  Sodium (mmol/L)  Date Value  05/28/2020 141   Potassium (mmol/L)  Date Value  05/28/2020 3.8   Magnesium (mg/dL)  Date Value  41/32/4401 2.0   Phosphorus (mg/dL)  Date Value  02/72/5366 2.9   Calcium (mg/dL)  Date Value  44/12/4740 7.3 (L)   Albumin (g/dL)  Date Value  59/56/3875 1.11 (L)    49 year old female with h/o TBI, seizures, hypothyroidism, diabetes. Patient admitted to ICU and subsequently transferred to the floor. Rapid response overnight for hypoxia and patient readmitted to the ICU. Blood cultures with peptostreptococcus and proteus. 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  Assessment/Plan: Electrolytes remain stable today. Plan for patient to get PEG tube placement by IR. Patient on D10 while tube feeds held for pending procedure.  All electrolytes with morning labs.  Laureen Ochs, PharmD Clinical Pharmacist 05/28/2020 11:10 AM

## 2020-05-28 NOTE — Progress Notes (Signed)
Daily Progress Note   Patient Name: Joanne [name]       Date: 05/28/2020 DOB: 12-11-1970  Age: 49 y.o. MRN#: [id number] Attending Physician: Flora Lipps, MD Primary Care Physician: Patient, No Pcp Per Admit Date: 05/15/2020  Reason for Consultation/Follow-up: Establishing goals of care  Subjective: Patient is resting in bed with feeding tube in place to nose. She looks at me but does not attempt to speak today. Awaiting PEG by IR.   Recommend palliative at D/C.    Length of Stay: 13  Current Medications: Scheduled Meds:  . carbamazepine  50 mg Per Tube QID  . chlorhexidine gluconate (MEDLINE KIT)  15 mL Mouth Rinse BID  . Chlorhexidine Gluconate Cloth  6 each Topical Daily  . collagenase   Topical Daily  . docusate  100 mg Oral BID  . feeding supplement (PROSource TF)  45 mL Per Tube Daily  . free water  200 mL Per Tube Q4H  . insulin aspart  0-9 Units Subcutaneous Q4H  . levothyroxine  50 mcg Intravenous Q0600  . mouth rinse  15 mL Mouth Rinse 10 times per day  . midodrine  10 mg Per Tube TID WC  . nutrition supplement (JUVEN)  1 packet Per Tube BID BM  . pantoprazole (PROTONIX) IV  40 mg Intravenous Q24H  . polyethylene glycol  17 g Oral Daily  . sodium chloride flush  10-40 mL Intracatheter Q12H    Continuous Infusions: . sodium chloride 10 mL/hr at 05/27/20 1510  . dextrose 25 mL/hr at 05/28/20 0626  . feeding supplement (VITAL 1.5 CAL) 50 mL/hr at 05/26/20 2000  . piperacillin-tazobactam (ZOSYN)  IV 12.5 mL/hr at 05/28/20 0626    PRN Meds: acetaminophen, bisacodyl, fentaNYL (SUBLIMAZE) injection, midazolam, midazolam, morphine injection, sodium chloride flush  Physical Exam Pulmonary:     Comments: Trach and vent.  Skin:    General: Skin is warm and dry.   Neurological:     Mental Status: She is alert.             Vital Signs: BP 93/62   Pulse 88   Temp 98.3 F (36.8 C) (Oral)   Resp (!) 24   Ht 5' 4.02" (1.626 m)   Wt 81.2 kg   LMP  (LMP Unknown)   SpO2 100%   BMI  30.71 kg/m  SpO2: SpO2: 100 % O2 Device: O2 Device: Ventilator O2 Flow Rate: O2 Flow Rate (L/min): 10 L/min  Intake/output summary:   Intake/Output Summary (Last 24 hours) at 05/28/2020 1321 Last data filed at 05/28/2020 8372 Gross per 24 hour  Intake 807.89 ml  Output 1495 ml  Net -687.11 ml   LBM: Last BM Date: 05/28/20 Baseline Weight: Weight: 63.5 kg Most recent weight: Weight: 81.2 kg       Palliative Assessment/Data:      Patient Active Problem List   Diagnosis Date Noted  . Mild malnutrition ([place])   . Protein-calorie malnutrition, severe 05/20/2020  . Weakness   . Full code status   . Hypokalemia 05/16/2020  . Severe sepsis with septic shock ([place]) 05/15/2020  . Unstageable pressure ulcer of sacral region ([place]) 03/10/2020  . Goals of care, counseling/discussion   . Palliative care by specialist   . DNR (do not resuscitate) discussion   . Septic shock ([place]) 03/02/2020  . Hypernatremia 11/15/2019  . Thrombocytopenia ([place]) 11/15/2019  . AKI (acute kidney injury) ([place]) 11/15/2019  . HTN (hypertension) 11/15/2019  . Non-insulin dependent type 2 diabetes mellitus ([place]) 11/15/2019  . Hypothyroidism 11/15/2019  . Severe sepsis with acute organ dysfunction ([place]) 11/15/2019  . Left hemiplegia ([place]) 11/15/2019  . Pressure injury of skin 11/15/2019  . Aspiration pneumonia due to gastric secretions ([place])   . Acute kidney injury (AKI) with acute tubular necrosis (ATN) ([facility])   . Traumatic brain injury ([place])   . Acute on chronic respiratory failure with hypoxia ([place]) 06/06/2018  . Tracheostomy status ([place]) 06/06/2018  . Pneumonia due to Klebsiella pneumoniae ([place]) 06/06/2018  . Acute on chronic combined systolic and diastolic CHF (congestive heart  failure) ([place]) 06/06/2018  . Sepsis secondary to UTI ([place]) 06/06/2018  . ARDS (adult respiratory distress syndrome) ([place]) 06/06/2018  . Acute on chronic respiratory failure with hypoxia ([place]) 06/06/2018  . Tracheostomy status ([place]) 06/06/2018    Palliative Care Assessment & Plan   Recommendations/Plan: Continue full code/full scope.  Recommend palliative at D/C.     Code Status:    Code Status Orders  (From admission, onward)         Start     Ordered   05/15/20 1905  Full code  Continuous        05/15/20 1905        Code Status History    Date Active Date Inactive Code Status Order ID Comments User Context   03/02/2020 1721 03/16/2020 2328 Full Code [id number]  Flora Lipps, MD ED   11/15/2019 0253 11/21/2019 0411 Full Code [id number]  Athena Masse, MD ED   09/10/2019 1522 10/16/2019 1653 Full Code [id number]  Tommie Ard Inpatient   Advance Care Planning Activity      Prognosis:  Unable to determine     Thank you for allowing the Palliative Medicine Team to assist in the care of this patient.   Total Time 15 min Prolonged Time Billed  no       Greater than 50%  of this time was spent counseling and coordinating care related to the above assessment and plan.  Asencion Gowda, NP  Please contact Palliative Medicine Team phone at [phone] for questions and concerns.

## 2020-05-28 NOTE — Consult Note (Signed)
Pharmacy Antibiotic Note  Joanne [name] is a 49 y.o. female admitted on 05/15/2020 with bacteremia.  Pharmacy has been consulted for pip/tazo dosing. ID following. 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  7/24 Bcx 1 out 4 bottles: PEPTOSTREPTOCOCCUS SPECIES + PROTEUS MIRABILIS  Repeat cultures 7/27 no growth. Another set of cultures were obtained 7/30 and are also no growth.  Plan: Continue Zosyn 3.375 g IV q8h extended infusion. ID is following the patient. Will f/u recommendations for further antibiotic therapy.  Height: 5' 4.02" (162.6 cm) Weight: 81.2 kg (179 lb 0.2 oz) IBW/kg (Calculated) : 54.74  Temp (24hrs), Avg:98.4 F (36.9 C), Min:97.9 F (36.6 C), Max:99 F (37.2 C)  Recent Labs  Lab 05/22/20 0417 05/24/20 0508 05/24/20 0510 05/25/20 0524 05/26/20 0449 05/27/20 0416 05/27/20 0417 05/28/20 0414  WBC  --  12.4*  --  11.7* 11.3* 7.4  --  6.4  CREATININE   < >  --  0.68 0.56 0.53  --  0.44 0.45   < > = values in this interval not displayed.    Estimated Creatinine Clearance: 86.7 mL/min (by C-G formula based on SCr of 0.45 mg/dL).    Allergies  Allergen Reactions  . Kaopectate  [Attapulgite] Itching  . Thiopental Itching  . Tomato     Antimicrobials this admission: 7/24 cefepime >> 7/27 7/27 meropenem >> 7/28 7/24 vancomycin >> 7/28  7/28 Zosyn >>   Microbiology results: 7/24 BCx: peptostreptococcus species, proteus mirabilis 7/27 BCx: NGTD 7/30 BCx: NGTD 7/25 MRSA PCR: negative  Thank you for allowing pharmacy to be a part of this patient's care.  Pricilla Riffle, PharmD 05/28/2020 11:12 AM

## 2020-05-28 NOTE — Progress Notes (Signed)
CRITICAL CARE PROGRESS NOTE    Name: Joanne [name] MRN: [id number] DOB: 08-17-1971     LOS: 46   SUBJECTIVE FINDINGS & SIGNIFICANT EVENTS     BRIEF PATIENT DESCRIPTION:  49yo F with TBI , trach status, seizure disorder as well as multiple additional comorbid conditions admitted with Septic Shock due to Proteus bacteremiaand peptostreptococcus in setting of Stage IV Sacral Decubitus with Osteomyelitis.  SIGNIFICANT EVENTS/STUDIES:  07/23: Pt admitted to stepdown unit with sepsis but remained housed in the ER pending bed availability  07/24: CT Pelvis revealed large sacral decubitus ulcer which has progressed significantly since prior study. Extension to the underlying sacrum and coccyx with evidence of osteomyelitis and bone destruction. No evidence of fluid collection or abscess. Stable midline ventral hernia as without evidence of bowel obstruction or strangulation. 07/24: Pt with hypotension PCCM team consulted to assist with management  7/26: General Surgery consulted, performed bedside debridement 7/26: Palliative Care consulted, pt's POA requests pt remain Full Code with aggressive medical interventions 7/27: ID consulted due to Proteus & peptostreptococcus  Bacteremia . Weaned off Vasopressors 7/28: Transfer out to Med-Surg unit 7/29: RR called due to Hypoxia in setting of pulmonary edema; transfer to ICU; PCCM reconsulted 7/29: Cuffless shiley exchanged for Cuffed shiley, placed on vent; Hypotensive requiring vasopressors 7/30: Left Femoral CVC & Left Femoral A-line placed 8/01:A-line D/C 05/28/20- GI unable to pass NGT, defer to IR.   Antimicrobials this admission: 7/24 cefepime >> 7/27 7/27 meropenem >> 7/28 7/24 vancomycin >>7/28  7/28 Zosyn >>  Microbiology results: 7/24 BCx:  peptostreptococcus species, proteus mirabilis 7/27 BCx: NGTD 7/30 BCx: NGTD 7/25 MRSA PCR: negative  Media Information   Document Information  Photo-  large sacral decubitus - present on admission      : CVC Triple Lumen 05/21/20 Left Femoral (Active)  Indication for Insertion or Continuance of Line Limited venous access - need for IV therapy >5 days (PICC only) 05/27/20 1918  Site Assessment Clean;Dry;Intact 05/27/20 1918  Proximal Lumen Status Infusing 05/27/20 1918  Medial Lumen Status Saline locked 05/27/20 1918  Distal Lumen Status In-line blood sampling system in place 05/27/20 1918  Dressing Type Transparent 05/27/20 1918  Dressing Status Clean;Dry;Intact;Antimicrobial disc in place 05/27/20 [place] checked and tightened 05/27/20 1918  Dressing Intervention New dressing;Dressing changed;Antimicrobial disc changed;Dressing reinforced 05/28/20 0500  Dressing Change Due 06/04/20 05/28/20 0500     NG/OG Tube Nasogastric 8 Fr. Right nare Xray Documented cm marking at nare/ corner of mouth (Active)  Cm Marking at Nare/Corner of Mouth (if applicable) 70 cm 96/29/52 0751  External Length of Tube (cm) - (if applicable) 70 cm 84/13/24 0751  Site Assessment Clean;Dry 05/28/20 0751  Ongoing Placement Verification Xray 05/28/20 0751  Status Clamped 05/28/20 0751  Drainage Appearance None 05/27/20 0800  Intake (mL) 60 mL 05/20/20 2200  Output (mL) 70 mL 05/27/20 1800     Urethral Catheter Wells Guiles, RN  Latex 14 Fr. (Active)  Indication for Insertion or Continuance of Catheter Chronic catheter use 05/28/20 0751  Site Assessment Clean;Intact 05/28/20 0751  Catheter Maintenance Bag below level of bladder;Catheter secured;Drainage bag/tubing not touching floor;Insertion date on drainage bag;No dependent loops 05/28/20 0751  Collection Container Standard drainage bag 05/28/20 0751  Securement Method Securing device (Describe) 05/28/20 0400  Urinary Catheter  Interventions (if applicable) Unclamped 40/10/27 0400  Output (mL) 125 mL 05/28/20 0626    Microbiology/Sepsis markers: Results for orders placed or performed during the hospital encounter of 05/15/20  Blood Culture (routine x 2)     Status: None   Collection Time: 05/15/20  1:29 PM   Specimen: BLOOD  Result Value Ref Range Status   Specimen Description BLOOD R ARM  Final   Special Requests   Final    BOTTLES DRAWN AEROBIC AND ANAEROBIC Blood Culture results may not be optimal due to an excessive volume of blood received in culture bottles   Culture   Final    NO GROWTH 5 DAYS Performed at [facility] [address] [place], [place] [address]    Report Status 05/20/2020 FINAL  Final  Blood Culture (routine x 2)     Status: Abnormal   Collection Time: 05/15/20  1:29 PM   Specimen: BLOOD  Result Value Ref Range Status   Specimen Description   Final    BLOOD L AC Performed at [facility], [address], [place], [place] [address]    Special Requests   Final    BOTTLES DRAWN AEROBIC AND ANAEROBIC Blood Culture results may not be optimal due to an inadequate volume of blood received in culture bottles Performed at [facility], [address] [place], [place] [address]    Culture  Setup Time   Final    Organism ID to follow [place] TO, READ BACK BY AND VERIFIED WITH: MYRA SLAUGHTER ON 05/16/2020 AT [place] BOTTLE ONLY CRITICAL RESULT CALLED TO, READ BACK BY AND VERIFIED WITH: DAVID BASANTI 05/18/20 0520 SJL/RWW Performed at [facility], [address] [place], [place] [address]    Culture PEPTOSTREPTOCOCCUS SPECIES PROTEUS MIRABILIS  (A)  Final   Report Status 05/20/2020 FINAL  Final   Organism ID, Bacteria PROTEUS MIRABILIS  Final      Susceptibility   Proteus mirabilis - MIC*    AMPICILLIN <=2 SENSITIVE Sensitive     CEFAZOLIN 8 SENSITIVE  Sensitive     CEFEPIME <=0.12 SENSITIVE Sensitive     CEFTAZIDIME <=1 SENSITIVE Sensitive     CEFTRIAXONE <=0.25 SENSITIVE Sensitive     CIPROFLOXACIN >=4 RESISTANT Resistant     GENTAMICIN <=1 SENSITIVE Sensitive     IMIPENEM 4 SENSITIVE Sensitive     TRIMETH/SULFA 80 RESISTANT Resistant     AMPICILLIN/SULBACTAM <=2 SENSITIVE Sensitive     PIP/TAZO <=4 SENSITIVE Sensitive     * PROTEUS MIRABILIS  Blood Culture ID Panel (Reflexed)     Status: None   Collection Time: 05/15/20  1:29 PM  Result Value Ref Range Status   Enterococcus species NOT DETECTED NOT DETECTED Final   Listeria monocytogenes NOT DETECTED NOT DETECTED Final   Staphylococcus species NOT DETECTED NOT DETECTED Final   Staphylococcus aureus (BCID) NOT DETECTED NOT DETECTED Final   Streptococcus species NOT DETECTED NOT DETECTED Final   Streptococcus agalactiae NOT DETECTED NOT DETECTED Final   Streptococcus pneumoniae NOT DETECTED NOT DETECTED Final   Streptococcus pyogenes NOT DETECTED NOT DETECTED Final   Acinetobacter baumannii NOT DETECTED NOT DETECTED Final   Enterobacteriaceae species NOT DETECTED NOT DETECTED Final   Enterobacter cloacae complex NOT DETECTED NOT DETECTED Final   Escherichia coli NOT DETECTED NOT DETECTED Final   Klebsiella oxytoca NOT DETECTED NOT DETECTED Final   Klebsiella pneumoniae NOT DETECTED NOT DETECTED Final   Proteus species NOT DETECTED NOT DETECTED Final   Serratia marcescens NOT DETECTED NOT DETECTED Final   Haemophilus influenzae NOT DETECTED NOT DETECTED Final   Neisseria meningitidis  NOT DETECTED NOT DETECTED Final   Pseudomonas aeruginosa NOT DETECTED NOT DETECTED Final   Candida albicans NOT DETECTED NOT DETECTED Final   Candida glabrata NOT DETECTED NOT DETECTED Final   Candida krusei NOT DETECTED NOT DETECTED Final   Candida parapsilosis NOT DETECTED NOT DETECTED Final   Candida tropicalis NOT DETECTED NOT DETECTED Final    Comment: Performed at [facility], [address] [place], [place] [address]  Blood Culture ID Panel (Reflexed)     Status: Abnormal   Collection Time: 05/15/20  1:29 PM  Result Value Ref Range Status   Enterococcus species NOT DETECTED NOT DETECTED Final   Listeria monocytogenes NOT DETECTED NOT DETECTED Final   Staphylococcus species NOT DETECTED NOT DETECTED Final   Staphylococcus aureus (BCID) NOT DETECTED NOT DETECTED Final   Streptococcus species NOT DETECTED NOT DETECTED Final   Streptococcus agalactiae NOT DETECTED NOT DETECTED Final   Streptococcus pneumoniae NOT DETECTED NOT DETECTED Final   Streptococcus pyogenes NOT DETECTED NOT DETECTED Final   Acinetobacter baumannii NOT DETECTED NOT DETECTED Final   Enterobacteriaceae species DETECTED (A) NOT DETECTED Final    Comment: Enterobacteriaceae represent a large family of gram-negative bacteria, not a single organism. CRITICAL RESULT CALLED TO, READ BACK BY AND VERIFIED WITH: DAVID BASANTI 05/18/20 0520 SJL    Enterobacter cloacae complex NOT DETECTED NOT DETECTED Final   Escherichia coli NOT DETECTED NOT DETECTED Final   Klebsiella oxytoca NOT DETECTED NOT DETECTED Final   Klebsiella pneumoniae NOT DETECTED NOT DETECTED Final   Proteus species DETECTED (A) NOT DETECTED Final    Comment: CRITICAL RESULT CALLED TO, READ BACK BY AND VERIFIED WITH: DAVID BASANTI 05/18/20 0520 SJL    Serratia marcescens NOT DETECTED NOT DETECTED Final   Carbapenem resistance NOT DETECTED NOT DETECTED Final   Haemophilus influenzae NOT DETECTED NOT DETECTED Final   Neisseria meningitidis NOT DETECTED NOT DETECTED Final   Pseudomonas aeruginosa NOT DETECTED NOT DETECTED Final   Candida albicans NOT DETECTED NOT DETECTED Final   Candida glabrata NOT DETECTED NOT DETECTED Final   Candida krusei NOT DETECTED NOT DETECTED Final   Candida parapsilosis NOT DETECTED NOT DETECTED Final   Candida tropicalis NOT DETECTED NOT DETECTED Final    Comment: Performed at [facility], [address] [place], [place] [address]  Urine culture     Status: Abnormal   Collection Time: 05/15/20  2:03 PM   Specimen: In/Out Cath Urine  Result Value Ref Range Status   Specimen Description   Final    IN/OUT CATH URINE Performed at [facility], [address], [place], [place] [address]    Special Requests   Final    NONE Performed at [facility], [address] [place], [place] [address]    Culture (A)  Final    <10,000 COLONIES/mL INSIGNIFICANT GROWTH Performed at [facility], [place] [address] [place], [place] [address]    Report Status 05/16/2020 FINAL  Final  SARS Coronavirus 2 by RT PCR (hospital order, performed in [facility] hospital lab) Nasopharyngeal Nasopharyngeal Swab     Status: None   Collection Time: 05/16/20 12:07 AM   Specimen: Nasopharyngeal Swab  Result Value Ref Range Status   SARS Coronavirus 2 NEGATIVE NEGATIVE Final    Comment: (NOTE) SARS-CoV-2 target nucleic acids are NOT DETECTED.  The SARS-CoV-2 RNA is generally detectable in upper and lower respiratory specimens during the acute phase of infection. The lowest concentration of SARS-CoV-2 viral copies  this assay can detect is 250 copies / mL. A negative result does not preclude SARS-CoV-2 infection and should not be used as the sole basis for treatment or other patient management decisions.  A negative result may occur with improper specimen collection / handling, submission of specimen other than nasopharyngeal swab, presence of viral mutation(s) within the areas targeted by this assay, and inadequate number of viral copies (<250 copies / mL). A negative result must be combined with clinical observations, patient history, and epidemiological information.  Fact Sheet for Patients:   StrictlyIdeas.no  Fact Sheet for Healthcare Providers: BankingDealers.co.za  This test is not yet approved or  cleared by the [place] FDA and has been authorized for detection and/or diagnosis of SARS-CoV-2 by FDA under an Emergency Use Authorization (EUA).  This EUA will remain in effect (meaning this test can be used) for the duration of the COVID-19 declaration under Section 564(b)(1) of the Act, 21 U.S.C. section 360bbb-3(b)(1), unless the authorization is terminated or revoked sooner.  Performed at [facility], [address] [place], [place] [address]   MRSA PCR Screening     Status: None   Collection Time: 05/16/20  2:37 PM   Specimen: Nasopharyngeal  Result Value Ref Range Status   MRSA by PCR NEGATIVE NEGATIVE Final    Comment:        The GeneXpert MRSA Assay (FDA approved for NASAL specimens only), is one component of a comprehensive MRSA colonization surveillance program. It is not intended to diagnose MRSA infection nor to guide or monitor treatment for MRSA infections. Performed at [facility], [address] [place], [place] [address]   CULTURE, BLOOD (ROUTINE X 2) w Reflex to ID Panel     Status: None   Collection Time: 05/18/20 12:46 AM   Specimen: BLOOD  Result Value Ref Range Status   Specimen Description BLOOD RIGHT HAND  Final   Special Requests BOTTLES DRAWN AEROBIC AND ANAEROBIC BCAV  Final   Culture   Final    NO GROWTH 5 DAYS Performed at [facility], [address] [place], [place] [address]    Report Status 05/23/2020 FINAL  Final  CULTURE, BLOOD (ROUTINE X 2) w Reflex to ID Panel     Status: None   Collection Time: 05/18/20  6:57 AM   Specimen: BLOOD  Result Value Ref Range Status   Specimen Description BLOOD RIGHT HAND  Final   Special Requests   Final    BOTTLES DRAWN AEROBIC ONLY Blood Culture adequate volume   Culture   Final    NO GROWTH 5 DAYS Performed at [facility] [address] [place], [place] [address]    Report Status 05/23/2020 FINAL  Final  Culture, respiratory     Status: None   Collection Time:  05/20/20  3:09 AM   Specimen: Tracheal Aspirate; Respiratory  Result Value Ref Range Status   Specimen Description   Final    TRACHEAL ASPIRATE Performed at [facility], [address], [place], [place] [address]    Special Requests   Final    NONE Performed at [facility], [address] [place], [place] [address]    Gram Stain   Final    RARE WBC PRESENT, PREDOMINANTLY MONONUCLEAR NO ORGANISMS SEEN Performed at [facility], [place] [address], [place], [place] [address]    Culture RARE CANDIDA ALBICANS  Final   Report Status 05/24/2020 FINAL  Final  Culture, blood (  Routine X 2) w Reflex to ID Panel     Status: None   Collection Time: 05/21/20  9:14 AM   Specimen: BLOOD  Result Value Ref Range Status   Specimen Description BLOOD A-LINE DRAW  Final   Special Requests   Final    BOTTLES DRAWN AEROBIC AND ANAEROBIC Blood Culture adequate volume   Culture   Final    NO GROWTH 5 DAYS Performed at [facility], [address], [place], [place] [address]    Report Status 05/26/2020 FINAL  Final  Culture, blood (Routine X 2) w Reflex to ID Panel     Status: None   Collection Time: 05/21/20 10:32 AM   Specimen: BLOOD  Result Value Ref Range Status   Specimen Description BLOOD A-LINE  Final   Special Requests   Final    BOTTLES DRAWN AEROBIC AND ANAEROBIC Blood Culture adequate volume   Culture   Final    NO GROWTH 5 DAYS Performed at [facility], [address] [place], [place] [address]    Report Status 05/26/2020 FINAL  Final    Anti-infectives:  Anti-infectives (From admission, onward)   Start     Dose/Rate Route Frequency Ordered Stop   05/19/20 1900  piperacillin-tazobactam (ZOSYN) IVPB 3.375 g     Discontinue     3.375 g 12.5 mL/hr over 240 Minutes Intravenous Every 8 hours 05/19/20 1844     05/18/20 1400  meropenem (MERREM) 1 g in sodium chloride 0.9 % 100 mL IVPB  Status:  Discontinued        1 g 200 mL/hr over 30  Minutes Intravenous Every 8 hours 05/18/20 1026 05/19/20 1831   05/17/20 2200  vancomycin (VANCOREADY) IVPB 750 mg/150 mL  Status:  Discontinued        750 mg 150 mL/hr over 60 Minutes Intravenous Every 12 hours 05/17/20 1431 05/19/20 1831   05/17/20 1800  ceFEPIme (MAXIPIME) 2 g in sodium chloride 0.9 % 100 mL IVPB  Status:  Discontinued        2 g 200 mL/hr over 30 Minutes Intravenous Every 8 hours 05/17/20 1443 05/18/20 1026   05/15/20 2200  vancomycin (VANCOREADY) IVPB 500 mg/100 mL  Status:  Discontinued        500 mg 100 mL/hr over 60 Minutes Intravenous Every 12 hours 05/15/20 1911 05/17/20 1431   05/15/20 2000  ceFEPIme (MAXIPIME) 2 g in sodium chloride 0.9 % 100 mL IVPB  Status:  Discontinued        2 g 200 mL/hr over 30 Minutes Intravenous Every 12 hours 05/15/20 1911 05/17/20 1443   05/15/20 1330  vancomycin (VANCOCIN) IVPB 1000 mg/200 mL premix        1,000 mg 200 mL/hr over 60 Minutes Intravenous  Once 05/15/20 1321 05/15/20 1541   05/15/20 1330  piperacillin-tazobactam (ZOSYN) IVPB 3.375 g        3.375 g 100 mL/hr over 30 Minutes Intravenous  Once 05/15/20 1321 05/15/20 1358       Consults: Treatment Team:  Tsosie Billing, MD Lucilla Lame, MD     PAST MEDICAL HISTORY   Past Medical History:  Diagnosis Date  . Acute kidney injury (AKI) with acute tubular necrosis (ATN) (HCC)   . Acute on chronic combined systolic and diastolic CHF (congestive heart failure) ([place]) 06/06/2018  . Acute on chronic respiratory failure with hypoxia ([place]) 06/06/2018  . ARDS (adult respiratory distress syndrome) ([place]) 06/06/2018  . Aspiration pneumonia due to gastric secretions ([place])   .  Diabetes mellitus ([place])   . History of traumatic brain injury   . Hypothyroidism   . Pneumonia due to Klebsiella pneumoniae ([place]) 06/06/2018  . Seizure disorder ([place])   . Severe sepsis ([place]) 06/06/2018  . Tracheostomy status ([place]) 06/06/2018  . Traumatic brain injury [facility])      SURGICAL  HISTORY   Past Surgical History:  Procedure Laterality Date  . TRACHEOSTOMY       FAMILY HISTORY   Family History  Family history unknown: Yes     SOCIAL HISTORY   Social History   Tobacco Use  . Smoking status: Never Smoker  . Smokeless tobacco: Never Used  Substance Use Topics  . Alcohol use: Not Currently  . Drug use: Not Currently     MEDICATIONS   Current Medication:  Current Facility-Administered Medications:  .  0.9 %  sodium chloride infusion, 250 mL, Intravenous, Continuous, Opyd, Ilene Qua, MD, Last Rate: 10 mL/hr at 05/27/20 1510, Continued from Pre-op at 05/27/20 1510 .  acetaminophen (TYLENOL) suppository 650 mg, 650 mg, Rectal, Q6H PRN, [place] Eric J, DO, 650 mg at 05/20/20 1825 .  bisacodyl (DULCOLAX) suppository 10 mg, 10 mg, Rectal, Daily PRN, Bonnell Public Tublu, MD .  carbamazepine (TEGRETOL) chewable tablet 50 mg, 50 mg, Per Tube, QID, Tyler Pita, MD, 50 mg at 05/27/20 2117 .  chlorhexidine gluconate (MEDLINE KIT) (PERIDEX) 0.12 % solution 15 mL, 15 mL, Mouth Rinse, BID, Kasa, Kurian, MD, 15 mL at 05/27/20 2002 .  Chlorhexidine Gluconate Cloth 2 % PADS 6 each, 6 each, Topical, Daily, Ottie Glazier, MD, 6 each at 05/27/20 1032 .  collagenase (SANTYL) ointment, , Topical, Daily, Vashti Hey, MD, Given at 05/27/20 1032 .  dextrose 10 % infusion, , Intravenous, Continuous, Darel Hong D, NP, Last Rate: 25 mL/hr at 05/28/20 0626, Rate Verify at 05/28/20 0626 .  docusate (COLACE) 50 MG/5ML liquid 100 mg, 100 mg, Oral, BID, Darel Hong D, NP, 100 mg at 05/26/20 1053 .  feeding supplement (PROSource TF) liquid 45 mL, 45 mL, Per Tube, Daily, Tyler Pita, MD, 45 mL at 05/26/20 1055 .  feeding supplement (VITAL 1.5 CAL) liquid 1,000 mL, 1,000 mL, Per Tube, Continuous, Kasa, Kurian, MD, Last Rate: 50 mL/hr at 05/26/20 2000, Rate Verify at 05/26/20 2000 .  fentaNYL (SUBLIMAZE) injection 50-200 mcg, 50-200 mcg,  Intravenous, Q30 min PRN, Darel Hong D, NP, 100 mcg at 05/28/20 0522 .  free water 200 mL, 200 mL, Per Tube, Q4H, Tyler Pita, MD, 200 mL at 05/28/20 0409 .  insulin aspart (novoLOG) injection 0-9 Units, 0-9 Units, Subcutaneous, Q4H, Awilda Bill, NP, 1 Units at 05/26/20 0458 .  levothyroxine (SYNTHROID, LEVOTHROID) injection 50 mcg, 50 mcg, Intravenous, Q0600, [place] Eric J, DO, 50 mcg at 05/28/20 9295 .  MEDLINE mouth rinse, 15 mL, Mouth Rinse, 10 times per day, Flora Lipps, MD, 15 mL at 05/28/20 0526 .  midazolam (VERSED) injection 2 mg, 2 mg, Intravenous, Q15 min PRN, Darel Hong D, NP .  midazolam (VERSED) injection 2 mg, 2 mg, Intravenous, Q2H PRN, Darel Hong D, NP .  midodrine (PROAMATINE) tablet 10 mg, 10 mg, Per Tube, TID WC, Flora Lipps, MD, 10 mg at 05/27/20 1728 .  morphine 2 MG/ML injection 1 mg, 1 mg, Intravenous, Q2H PRN, Flora Lipps, MD, 1 mg at 05/28/20 0405 .  nutrition supplement (JUVEN) (JUVEN) powder packet 1 packet, 1 packet, Per Tube, BID BM, Flora Lipps, MD, 1 packet at 05/27/20 1034 .  pantoprazole (PROTONIX) injection 40 mg, 40 mg, Intravenous, Q24H, Darel Hong D, NP, 40 mg at 05/28/20 0522 .  piperacillin-tazobactam (ZOSYN) IVPB 3.375 g, 3.375 g, Intravenous, Q8H, Oswald Hillock, RPH, Last Rate: 12.5 mL/hr at 05/28/20 0626, Rate Verify at 05/28/20 0626 .  polyethylene glycol (MIRALAX / GLYCOLAX) packet 17 g, 17 g, Oral, Daily, Lu Duffel, RPH, 17 g at 05/26/20 1054 .  sodium chloride flush (NS) 0.9 % injection 10-40 mL, 10-40 mL, Intracatheter, Q12H, Bonnell Public Tublu, MD, 10 mL at 05/27/20 2110 .  sodium chloride flush (NS) 0.9 % injection 10-40 mL, 10-40 mL, Intracatheter, PRN, Bonnell Public Tublu, MD, 10 mL at 05/25/20 1000    ALLERGIES   Kaopectate  [attapulgite], Thiopental, and Tomato    REVIEW OF SYSTEMS     Unable to obtain due to Critically ill status  PHYSICAL EXAMINATION   Vital  Signs: Temp:  [97.9 F (36.6 C)-99 F (37.2 C)] 98.3 F (36.8 C) (08/06 0400) Pulse Rate:  [64-102] 88 (08/06 0600) Resp:  [24-25] 24 (08/06 0400) BP: (89-106)/(38-72) 93/62 (08/06 0600) SpO2:  [85 %-100 %] 100 % (08/06 0703) FiO2 (%):  [40 %-50 %] 40 % (08/06 0703) Weight:  [81.2 kg] 81.2 kg (08/06 0400)  GENERAL:NAD HEAD: Normocephalic, atraumatic.  EYES: Pupils equal, round, reactive to light.  No scleral icterus.  MOUTH: Moist mucosal membrane. NECK: Supple. No thyromegaly. No nodules. No JVD.  PULMONARY: decreased breath sounds bilaterally  CARDIOVASCULAR: S1 and S2. Regular rate and rhythm. No murmurs, rubs, or gallops.  GASTROINTESTINAL: Soft, nontender, non-distended. No masses. Positive bowel sounds. No hepatosplenomegaly.  MUSCULOSKELETAL: No swelling, clubbing, or edema.  NEUROLOGIC: Mild distress due to acute illness SKIN:intact,warm,dry   PERTINENT DATA     Infusions: . sodium chloride 10 mL/hr at 05/27/20 1510  . dextrose 25 mL/hr at 05/28/20 0626  . feeding supplement (VITAL 1.5 CAL) 50 mL/hr at 05/26/20 2000  . piperacillin-tazobactam (ZOSYN)  IV 12.5 mL/hr at 05/28/20 1025   Scheduled Medications: . carbamazepine  50 mg Per Tube QID  . chlorhexidine gluconate (MEDLINE KIT)  15 mL Mouth Rinse BID  . Chlorhexidine Gluconate Cloth  6 each Topical Daily  . collagenase   Topical Daily  . docusate  100 mg Oral BID  . feeding supplement (PROSource TF)  45 mL Per Tube Daily  . free water  200 mL Per Tube Q4H  . insulin aspart  0-9 Units Subcutaneous Q4H  . levothyroxine  50 mcg Intravenous Q0600  . mouth rinse  15 mL Mouth Rinse 10 times per day  . midodrine  10 mg Per Tube TID WC  . nutrition supplement (JUVEN)  1 packet Per Tube BID BM  . pantoprazole (PROTONIX) IV  40 mg Intravenous Q24H  . polyethylene glycol  17 g Oral Daily  . sodium chloride flush  10-40 mL Intracatheter Q12H   PRN Medications: acetaminophen, bisacodyl, fentaNYL (SUBLIMAZE)  injection, midazolam, midazolam, morphine injection, sodium chloride flush Hemodynamic parameters:   Intake/Output: 08/05 0701 - 08/06 0700 In: 807.9 [I.V.:282.8; NG/GT:410; IV Piggyback:115.1] Out: 8527 [Urine:1225; Emesis/NG output:70; POEUM:353]  Ventilator  Settings: Vent Mode: PCV FiO2 (%):  [40 %-50 %] 40 % Set Rate:  [16 bmp] 16 bmp PEEP:  [5 cmH20] 5 cmH20 Plateau Pressure:  [14 cmH20-29 cmH20] 29 cmH20    LAB RESULTS:  Basic Metabolic Panel: Recent Labs  Lab 05/22/20 0417 05/24/20 6144 05/24/20 0510 05/24/20 0510 05/25/20 3154 05/25/20 0086 05/26/20 7619 05/26/20 0449 05/27/20 0416 05/27/20 5093  05/28/20 0414  NA   < >  --  144  --  141  --  142  --   --  141 141  K   < >  --  2.9*   < > 3.8   < > 3.4*   < >  --  4.3 3.8  CL   < >  --  107  --  104  --  105  --   --  105 107  CO2   < >  --  32  --  31  --  30  --   --  30 29  GLUCOSE   < >  --  139*  --  143*  --  147*  --   --  96 79  BUN   < >  --  13  --  12  --  13  --   --  10 7  CREATININE   < >  --  0.68  --  0.56  --  0.53  --   --  0.44 0.45  CALCIUM   < >  --  7.0*  --  7.3*  --  7.6*  --   --  7.7* 7.3*  MG  --  2.1  --   --  1.9  --  2.0  --  1.9  --  2.0  PHOS   < >  --  1.8*  --  2.2*  --  2.5  --   --  2.7 2.9   < > = values in this interval not displayed.   Liver Function Tests: Recent Labs  Lab 05/22/20 0417 05/22/20 0417 05/24/20 0510 05/25/20 0524 05/26/20 0449 05/27/20 0417 05/28/20 0414  AST 21  --   --   --   --   --   --   ALT 14  --   --   --   --   --   --   ALKPHOS 105  --   --   --   --   --   --   BILITOT 1.0  --   --   --   --   --   --   PROT 5.4*  --   --   --   --   --   --   ALBUMIN 2.3*   < > 1.9* 2.0* 2.1* 1.8* 1.6*   < > = values in this interval not displayed.   No results for input(s): LIPASE, AMYLASE in the last 168 hours. No results for input(s): AMMONIA in the last 168 hours. CBC: Recent Labs  Lab 05/24/20 0508 05/24/20 0714 05/25/20 0524  05/25/20 1631 05/26/20 0449 05/27/20 0416 05/28/20 0414  WBC 12.4*  --  11.7*  --  11.3* 7.4 6.4  NEUTROABS 10.2*  --  9.7*  --  9.0* 6.0 5.2  HGB 5.5*   < > 6.5* 8.3* 9.3* 8.6* 8.2*  HCT 16.6*   < > 20.1* 25.3* 27.0* 26.2* 25.1*  MCV 84.7  --  90.5  --  88.8 92.6 95.4  PLT 97*  --  116*  --  131* 150 162   < > = values in this interval not displayed.   Cardiac Enzymes: No results for input(s): CKTOTAL, CKMB, CKMBINDEX, TROPONINI in the last 168 hours. BNP: Invalid input(s): POCBNP CBG: Recent Labs  Lab 05/27/20 1948 05/28/20 0010 05/28/20 0045 05/28/20 0407 05/28/20 0731  GLUCAP 65* 60* 96 80 77  IMAGING RESULTS:  Imaging: DG Chest Port 1 View  Result Date: 05/28/2020 CLINICAL DATA:  Acute respiratory failure EXAM: PORTABLE CHEST 1 VIEW COMPARISON:  05/23/2020. FINDINGS: Tracheostomy tube feeding tube in stable position. Heart size stable. Diffuse bilateral prominent pulmonary infiltrates/edema again noted. Progression of infiltrate in the left upper lung noted. Interim improvement in left base infiltrate. No pleural effusion or pneumothorax. Thoracic spine scoliosis. IMPRESSION: 1.  Tracheostomy tube feeding tube in stable position. 2. Diffuse bilateral prominent pulmonary infiltrates/edema again noted. Progression of infiltrate left upper lung noted. Interim improvement in left base infiltrate. Electronically Signed   By: Marcello Moores  Register   On: 05/28/2020 05:35   @[facility]P @ DG Chest Port 1 View  Result Date: 05/28/2020 CLINICAL DATA:  Acute respiratory failure EXAM: PORTABLE CHEST 1 VIEW COMPARISON:  05/23/2020. FINDINGS: Tracheostomy tube feeding tube in stable position. Heart size stable. Diffuse bilateral prominent pulmonary infiltrates/edema again noted. Progression of infiltrate in the left upper lung noted. Interim improvement in left base infiltrate. No pleural effusion or pneumothorax. Thoracic spine scoliosis. IMPRESSION: 1.  Tracheostomy tube feeding tube in  stable position. 2. Diffuse bilateral prominent pulmonary infiltrates/edema again noted. Progression of infiltrate left upper lung noted. Interim improvement in left base infiltrate. Electronically Signed   By: Marcello Moores  Register   On: 05/28/2020 05:35     ASSESSMENT AND PLAN    -Multidisciplinary rounds held today  Acute Hypoxic Respiratory Failure -due to sepsis bacteremia present on admission -continue antibiotics as per ID -tracheostomy status   Severe protein calorie malnutrition  -bitemporal and peripheral muscle wating  -low albumin   - nutritional consult - dietary RD evaluation   - plan for OGT/NGT nourishment   Septic shock  present on admission - proteus bacteremia -use vasopressors to keep MAP>65 -follow ABG and LA -follow up cultures -emperic ABX -consider stress dose steroids   ID -continue IV abx as prescibed -follow up cultures  GI/Nutrition GI PROPHYLAXIS as indicated DIET-->TF's as tolerated Constipation protocol as indicated  ENDO - ICU hypoglycemic\Hyperglycemia protocol -check FSBS per protocol   ELECTROLYTES -follow labs as needed -replace as needed -pharmacy consultation   DVT/GI PRX ordered -SCDs  TRANSFUSIONS AS NEEDED MONITOR FSBS ASSESS the need for LABS as needed   Critical care provider statement:    Critical care time (minutes):  33   Critical care time was exclusive of:  Separately billable procedures and treating other patients   Critical care was necessary to treat or prevent imminent or life-threatening deterioration of the following conditions:  acute hypoxemia, bacteremia from proteus, tracheostomy status.    Critical care was time spent personally by me on the following activities:  Development of treatment plan with patient or surrogate, discussions with consultants, evaluation of patient's response to treatment, examination of patient, obtaining history from patient or surrogate, ordering and performing treatments and  interventions, ordering and review of laboratory studies and re-evaluation of patient's condition.  I assumed direction of critical care for this patient from another provider in my specialty: no    This document was prepared using Dragon voice recognition software and may include unintentional dictation errors.    Ottie Glazier, M.D.  Division of [facility]

## 2020-05-28 NOTE — Progress Notes (Signed)
A PEG tube was attempted yesterday without the ability to transilluminate and find a adequate spot for the PEG tube placement.  I have recommended to the ICU team to get a interventional radiology placed PEG.  Nothing further to do from a GI point of view.  I will sign off.  Please call if any further GI concerns or questions.  We would like to thank you for the opportunity to participate in the care of Joanne [name].

## 2020-05-29 LAB — MAGNESIUM: Magnesium: 1.7 mg/dL (ref 1.7–2.4)

## 2020-05-29 LAB — CBC WITH DIFFERENTIAL/PLATELET
Abs Immature Granulocytes: 0.06 10*3/uL (ref 0.00–0.07)
Basophils Absolute: 0 10*3/uL (ref 0.0–0.1)
Basophils Relative: 0 %
Eosinophils Absolute: 0.2 10*3/uL (ref 0.0–0.5)
Eosinophils Relative: 2 %
HCT: 26.6 % — ABNORMAL LOW (ref 36.0–46.0)
Hemoglobin: 8.6 g/dL — ABNORMAL LOW (ref 12.0–15.0)
Immature Granulocytes: 1 %
Lymphocytes Relative: 10 %
Lymphs Abs: 0.7 10*3/uL (ref 0.7–4.0)
MCH: 30.6 pg (ref 26.0–34.0)
MCHC: 32.3 g/dL (ref 30.0–36.0)
MCV: 94.7 fL (ref 80.0–100.0)
Monocytes Absolute: 0.5 10*3/uL (ref 0.1–1.0)
Monocytes Relative: 7 %
Neutro Abs: 5.3 10*3/uL (ref 1.7–7.7)
Neutrophils Relative %: 80 %
Platelets: 215 10*3/uL (ref 150–400)
RBC: 2.81 MIL/uL — ABNORMAL LOW (ref 3.87–5.11)
RDW: 19.9 % — ABNORMAL HIGH (ref 11.5–15.5)
WBC: 6.6 10*3/uL (ref 4.0–10.5)
nRBC: 0 % (ref 0.0–0.2)

## 2020-05-29 LAB — RENAL FUNCTION PANEL
Albumin: 1.6 g/dL — ABNORMAL LOW (ref 3.5–5.0)
Anion gap: 8 (ref 5–15)
BUN: 6 mg/dL (ref 6–20)
CO2: 28 mmol/L (ref 22–32)
Calcium: 7.2 mg/dL — ABNORMAL LOW (ref 8.9–10.3)
Chloride: 106 mmol/L (ref 98–111)
Creatinine, Ser: 0.59 mg/dL (ref 0.44–1.00)
GFR calc Af Amer: >60 mL/min (ref >60)
GFR calc non Af Amer: >60 mL/min (ref >60)
Glucose, Bld: 115 mg/dL — ABNORMAL HIGH (ref 70–99)
Phosphorus: 2.6 mg/dL (ref 2.5–4.6)
Potassium: 3.7 mmol/L (ref 3.5–5.1)
Sodium: 142 mmol/L (ref 135–145)

## 2020-05-29 LAB — GLUCOSE, CAPILLARY
Glucose-Capillary: 119 mg/dL — ABNORMAL HIGH (ref 70–99)
Glucose-Capillary: 126 mg/dL — ABNORMAL HIGH (ref 70–99)
Glucose-Capillary: 98 mg/dL (ref 70–99)
Glucose-Capillary: 77 mg/dL (ref 70–99)
Glucose-Capillary: 88 mg/dL (ref 70–99)

## 2020-05-29 MED ORDER — MAGNESIUM SULFATE 2 GM/50ML IV SOLN
2.0000 g | Freq: Once | INTRAVENOUS | Status: AC
  Administered 2020-05-29: 2 g via INTRAVENOUS
  Filled 2020-05-29: qty 50

## 2020-05-29 NOTE — Progress Notes (Signed)
Pharmacy Electrolyte Monitoring Consult:  Pharmacy consulted to assist in monitoring and replacing electrolytes in this 49 y.o. female admitted on 05/15/2020  Labs:  Sodium (mmol/L)  Date Value  05/29/2020 142   Potassium (mmol/L)  Date Value  05/29/2020 3.7   Magnesium (mg/dL)  Date Value  29/93/7169 1.7   Phosphorus (mg/dL)  Date Value  67/89/3810 2.6   Calcium (mg/dL)  Date Value  17/51/0258 7.2 (L)   Albumin (g/dL)  Date Value  52/77/8242 1.6 (L)   Corrected Ca: 9.75 mg/dL  49 year old female with h/o TBI, seizures, hypothyroidism, diabetes. Patient admitted to ICU and subsequently transferred to the floor. Rapid response 7/29 for hypoxia and patient readmitted to the ICU. Blood cultures with peptostreptococcus and proteus. 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  Plan:  Magnesium is borderline and trending lower: replace with 2 grams IV magnesium sulfate x 1  F/U electrolytes with morning labs  Burnis Medin, PharmD Clinical Pharmacist 05/29/2020 11:03 AM

## 2020-05-29 NOTE — Progress Notes (Signed)
CRITICAL CARE PROGRESS NOTE    Name: ROBBIE NANGLE MRN: [id number] DOB: 06-May-1971     LOS: 71   SUBJECTIVE FINDINGS & SIGNIFICANT EVENTS     BRIEF PATIENT DESCRIPTION:  49yo F with TBI , trach status, seizure disorder as well as multiple additional comorbid conditions admitted with Septic Shock due to Proteus bacteremiaand peptostreptococcus in setting of Stage IV Sacral Decubitus with Osteomyelitis.  SIGNIFICANT EVENTS/STUDIES:  07/23: Pt admitted to stepdown unit with sepsis but remained housed in the ER pending bed availability  07/24: CT Pelvis revealed large sacral decubitus ulcer which has progressed significantly since prior study. Extension to the underlying sacrum and coccyx with evidence of osteomyelitis and bone destruction. No evidence of fluid collection or abscess. Stable midline ventral hernia as without evidence of bowel obstruction or strangulation. 07/24: Pt with hypotension PCCM team consulted to assist with management  7/26: General Surgery consulted, performed bedside debridement 7/26: Palliative Care consulted, pt's POA requests pt remain Full Code with aggressive medical interventions 7/27: ID consulted due to Proteus & peptostreptococcus  Bacteremia . Weaned off Vasopressors 7/28: Transfer out to Med-Surg unit 7/29: RR called due to Hypoxia in setting of pulmonary edema; transfer to ICU; PCCM reconsulted 7/29: Cuffless shiley exchanged for Cuffed shiley, placed on vent; Hypotensive requiring vasopressors 7/30: Left Femoral CVC & Left Femoral A-line placed 8/01:A-line D/C 05/28/20- GI unable to pass NGT, defer to IR.  05/29/20 - reviewed nourishment needs with RD, will increase rate of Feeds per [place]  Antimicrobials this admission: 7/24 cefepime >> 7/27 7/27 meropenem >> 7/28 7/24  vancomycin >>7/28  7/28 Zosyn >>  Microbiology results: 7/24 BCx: peptostreptococcus species, proteus mirabilis 7/27 BCx: NGTD 7/30 BCx: NGTD 7/25 MRSA PCR: negative  Media Information   Document Information  Photo-  large sacral decubitus - present on admission      : CVC Triple Lumen 05/21/20 Left Femoral (Active)  Indication for Insertion or Continuance of Line Limited venous access - need for IV therapy >5 days (PICC only) 05/27/20 1918  Site Assessment Clean;Dry;Intact 05/27/20 1918  Proximal Lumen Status Infusing 05/27/20 1918  Medial Lumen Status Saline locked 05/27/20 1918  Distal Lumen Status In-line blood sampling system in place 05/27/20 1918  Dressing Type Transparent 05/27/20 1918  Dressing Status Clean;Dry;Intact;Antimicrobial disc in place 05/27/20 [place] checked and tightened 05/27/20 1918  Dressing Intervention New dressing;Dressing changed;Antimicrobial disc changed;Dressing reinforced 05/28/20 0500  Dressing Change Due 06/04/20 05/28/20 0500     NG/OG Tube Nasogastric 8 Fr. Right nare Xray Documented cm marking at nare/ corner of mouth (Active)  Cm Marking at Nare/Corner of Mouth (if applicable) 70 cm 97/28/20 0751  External Length of Tube (cm) - (if applicable) 70 cm 60/15/61 0751  Site Assessment Clean;Dry 05/28/20 0751  Ongoing Placement Verification Xray 05/28/20 0751  Status Clamped 05/28/20 0751  Drainage Appearance None 05/27/20 0800  Intake (mL) 60 mL 05/20/20 2200  Output (mL) 70 mL 05/27/20 1800     Urethral Catheter Wells Guiles, RN  Latex 14 Fr. (Active)  Indication for Insertion or Continuance of Catheter Chronic catheter use 05/28/20 0751  Site Assessment Clean;Intact 05/28/20 0751  Catheter Maintenance Bag below level of bladder;Catheter secured;Drainage bag/tubing not touching floor;Insertion date on drainage bag;No dependent loops 05/28/20 0751  Collection Container Standard drainage bag 05/28/20 0751  Securement  Method Securing device (Describe) 05/28/20 0400  Urinary Catheter Interventions (if applicable) Unclamped 53/79/43 0400  Output (mL) 125 mL 05/28/20 0626    Microbiology/Sepsis  markers: Results for orders placed or performed during the hospital encounter of 05/15/20  Blood Culture (routine x 2)     Status: None   Collection Time: 05/15/20  1:29 PM   Specimen: BLOOD  Result Value Ref Range Status   Specimen Description BLOOD R ARM  Final   Special Requests   Final    BOTTLES DRAWN AEROBIC AND ANAEROBIC Blood Culture results may not be optimal due to an excessive volume of blood received in culture bottles   Culture   Final    NO GROWTH 5 DAYS Performed at [facility], [address] [place], [place] [address]    Report Status 05/20/2020 FINAL  Final  Blood Culture (routine x 2)     Status: Abnormal   Collection Time: 05/15/20  1:29 PM   Specimen: BLOOD  Result Value Ref Range Status   Specimen Description   Final    BLOOD L AC Performed at [facility], [address], [place], [place] [address]    Special Requests   Final    BOTTLES DRAWN AEROBIC AND ANAEROBIC Blood Culture results may not be optimal due to an inadequate volume of blood received in culture bottles Performed at [facility], [address] [place], [place] [address]    Culture  Setup Time   Final    Organism ID to follow [place] TO, READ BACK BY AND VERIFIED WITH: MYRA SLAUGHTER ON 05/16/2020 AT [place] ONLY CRITICAL RESULT CALLED TO, READ BACK BY AND VERIFIED WITH: DAVID BASANTI 05/18/20 0520 SJL/RWW Performed at [facility], [address] [place], [place] [address]    Culture PEPTOSTREPTOCOCCUS SPECIES PROTEUS MIRABILIS  (A)  Final   Report Status 05/20/2020 FINAL  Final   Organism ID, Bacteria PROTEUS MIRABILIS  Final      Susceptibility   Proteus mirabilis - MIC*     AMPICILLIN <=2 SENSITIVE Sensitive     CEFAZOLIN 8 SENSITIVE Sensitive     CEFEPIME <=0.12 SENSITIVE Sensitive     CEFTAZIDIME <=1 SENSITIVE Sensitive     CEFTRIAXONE <=0.25 SENSITIVE Sensitive     CIPROFLOXACIN >=4 RESISTANT Resistant     GENTAMICIN <=1 SENSITIVE Sensitive     IMIPENEM 4 SENSITIVE Sensitive     TRIMETH/SULFA 80 RESISTANT Resistant     AMPICILLIN/SULBACTAM <=2 SENSITIVE Sensitive     PIP/TAZO <=4 SENSITIVE Sensitive     * PROTEUS MIRABILIS  Blood Culture ID Panel (Reflexed)     Status: None   Collection Time: 05/15/20  1:29 PM  Result Value Ref Range Status   Enterococcus species NOT DETECTED NOT DETECTED Final   Listeria monocytogenes NOT DETECTED NOT DETECTED Final   Staphylococcus species NOT DETECTED NOT DETECTED Final   Staphylococcus aureus (BCID) NOT DETECTED NOT DETECTED Final   Streptococcus species NOT DETECTED NOT DETECTED Final   Streptococcus agalactiae NOT DETECTED NOT DETECTED Final   Streptococcus pneumoniae NOT DETECTED NOT DETECTED Final   Streptococcus pyogenes NOT DETECTED NOT DETECTED Final   Acinetobacter baumannii NOT DETECTED NOT DETECTED Final   Enterobacteriaceae species NOT DETECTED NOT DETECTED Final   Enterobacter cloacae complex NOT DETECTED NOT DETECTED Final   Escherichia coli NOT DETECTED NOT DETECTED Final   Klebsiella oxytoca NOT DETECTED NOT DETECTED Final   Klebsiella pneumoniae NOT DETECTED NOT DETECTED Final   Proteus species NOT DETECTED NOT DETECTED Final   Serratia marcescens NOT DETECTED NOT DETECTED  Final   Haemophilus influenzae NOT DETECTED NOT DETECTED Final   Neisseria meningitidis NOT DETECTED NOT DETECTED Final   Pseudomonas aeruginosa NOT DETECTED NOT DETECTED Final   Candida albicans NOT DETECTED NOT DETECTED Final   Candida glabrata NOT DETECTED NOT DETECTED Final   Candida krusei NOT DETECTED NOT DETECTED Final   Candida parapsilosis NOT DETECTED NOT DETECTED Final   Candida tropicalis NOT DETECTED NOT  DETECTED Final    Comment: Performed at [facility], [address] [place], [place] [address]  Blood Culture ID Panel (Reflexed)     Status: Abnormal   Collection Time: 05/15/20  1:29 PM  Result Value Ref Range Status   Enterococcus species NOT DETECTED NOT DETECTED Final   Listeria monocytogenes NOT DETECTED NOT DETECTED Final   Staphylococcus species NOT DETECTED NOT DETECTED Final   Staphylococcus aureus (BCID) NOT DETECTED NOT DETECTED Final   Streptococcus species NOT DETECTED NOT DETECTED Final   Streptococcus agalactiae NOT DETECTED NOT DETECTED Final   Streptococcus pneumoniae NOT DETECTED NOT DETECTED Final   Streptococcus pyogenes NOT DETECTED NOT DETECTED Final   Acinetobacter baumannii NOT DETECTED NOT DETECTED Final   Enterobacteriaceae species DETECTED (A) NOT DETECTED Final    Comment: Enterobacteriaceae represent a large family of gram-negative bacteria, not a single organism. CRITICAL RESULT CALLED TO, READ BACK BY AND VERIFIED WITH: DAVID BASANTI 05/18/20 0520 SJL    Enterobacter cloacae complex NOT DETECTED NOT DETECTED Final   Escherichia coli NOT DETECTED NOT DETECTED Final   Klebsiella oxytoca NOT DETECTED NOT DETECTED Final   Klebsiella pneumoniae NOT DETECTED NOT DETECTED Final   Proteus species DETECTED (A) NOT DETECTED Final    Comment: CRITICAL RESULT CALLED TO, READ BACK BY AND VERIFIED WITH: DAVID BASANTI 05/18/20 0520 SJL    Serratia marcescens NOT DETECTED NOT DETECTED Final   Carbapenem resistance NOT DETECTED NOT DETECTED Final   Haemophilus influenzae NOT DETECTED NOT DETECTED Final   Neisseria meningitidis NOT DETECTED NOT DETECTED Final   Pseudomonas aeruginosa NOT DETECTED NOT DETECTED Final   Candida albicans NOT DETECTED NOT DETECTED Final   Candida glabrata NOT DETECTED NOT DETECTED Final   Candida krusei NOT DETECTED NOT DETECTED Final   Candida parapsilosis NOT DETECTED NOT DETECTED Final   Candida tropicalis NOT DETECTED NOT  DETECTED Final    Comment: Performed at [facility], [address] [place], [place] [address]  Urine culture     Status: Abnormal   Collection Time: 05/15/20  2:03 PM   Specimen: In/Out Cath Urine  Result Value Ref Range Status   Specimen Description   Final    IN/OUT CATH URINE Performed at [facility], [address] [place], [place] [address]    Special Requests   Final    NONE Performed at [facility], [address], [place], [place] [address]    Culture (A)  Final    <10,000 COLONIES/mL INSIGNIFICANT GROWTH Performed at [facility], [address] [address], [place], [place] [address]    Report Status 05/16/2020 FINAL  Final  SARS Coronavirus 2 by RT PCR (hospital order, performed in [facility] hospital lab) Nasopharyngeal Nasopharyngeal Swab     Status: None   Collection Time: 05/16/20 12:07 AM   Specimen: Nasopharyngeal Swab  Result Value Ref Range Status   SARS Coronavirus 2 NEGATIVE NEGATIVE Final    Comment: (NOTE) SARS-CoV-2 target nucleic acids are NOT DETECTED.  The SARS-CoV-2 RNA is generally detectable in upper and lower respiratory  specimens during the acute phase of infection. The lowest concentration of SARS-CoV-2 viral copies this assay can detect is 250 copies / mL. A negative result does not preclude SARS-CoV-2 infection and should not be used as the sole basis for treatment or other patient management decisions.  A negative result may occur with improper specimen collection / handling, submission of specimen other than nasopharyngeal swab, presence of viral mutation(s) within the areas targeted by this assay, and inadequate number of viral copies (<250 copies / mL). A negative result must be combined with clinical observations, patient history, and epidemiological information.  Fact Sheet for Patients:   StrictlyIdeas.no  Fact Sheet for Healthcare  Providers: BankingDealers.co.za  This test is not yet approved or  cleared by the [place] FDA and has been authorized for detection and/or diagnosis of SARS-CoV-2 by FDA under an Emergency Use Authorization (EUA).  This EUA will remain in effect (meaning this test can be used) for the duration of the COVID-19 declaration under Section 564(b)(1) of the Act, 21 U.S.C. section 360bbb-3(b)(1), unless the authorization is terminated or revoked sooner.  Performed at [facility], [address] [place], [place] [address]   MRSA PCR Screening     Status: None   Collection Time: 05/16/20  2:37 PM   Specimen: Nasopharyngeal  Result Value Ref Range Status   MRSA by PCR NEGATIVE NEGATIVE Final    Comment:        The GeneXpert MRSA Assay (FDA approved for NASAL specimens only), is one component of a comprehensive MRSA colonization surveillance program. It is not intended to diagnose MRSA infection nor to guide or monitor treatment for MRSA infections. Performed at [facility], [address] [place], [place] [address]   CULTURE, BLOOD (ROUTINE X 2) w Reflex to ID Panel     Status: None   Collection Time: 05/18/20 12:46 AM   Specimen: BLOOD  Result Value Ref Range Status   Specimen Description BLOOD RIGHT HAND  Final   Special Requests BOTTLES DRAWN AEROBIC AND ANAEROBIC BCAV  Final   Culture   Final    NO GROWTH 5 DAYS Performed at [facility], [address] [place], [place] [address]    Report Status 05/23/2020 FINAL  Final  CULTURE, BLOOD (ROUTINE X 2) w Reflex to ID Panel     Status: None   Collection Time: 05/18/20  6:57 AM   Specimen: BLOOD  Result Value Ref Range Status   Specimen Description BLOOD RIGHT HAND  Final   Special Requests   Final    BOTTLES DRAWN AEROBIC ONLY Blood Culture adequate volume   Culture   Final    NO GROWTH 5 DAYS Performed at [facility], [address], [place],  [place] [address]    Report Status 05/23/2020 FINAL  Final  Culture, respiratory     Status: None   Collection Time: 05/20/20  3:09 AM   Specimen: Tracheal Aspirate; Respiratory  Result Value Ref Range Status   Specimen Description   Final    TRACHEAL ASPIRATE Performed at [facility], [address] [place], [place] [address]    Special Requests   Final    NONE Performed at [facility], [address] [place],  [address]    Gram Stain   Final    RARE WBC PRESENT, PREDOMINANTLY MONONUCLEAR NO ORGANISMS SEEN Performed at [facility], [address] [place],  [address]    Culture RARE CANDIDA  ALBICANS  Final   Report Status 05/24/2020 FINAL  Final  Culture, blood (Routine X 2) w Reflex to ID Panel     Status: None   Collection Time: 05/21/20  9:14 AM   Specimen: BLOOD  Result Value Ref Range Status   Specimen Description BLOOD A-LINE DRAW  Final   Special Requests   Final    BOTTLES DRAWN AEROBIC AND ANAEROBIC Blood Culture adequate volume   Culture   Final    NO GROWTH 5 DAYS Performed at [facility], [address] [place] [address], [place], [place] [address]    Report Status 05/26/2020 FINAL  Final  Culture, blood (Routine X 2) w Reflex to ID Panel     Status: None   Collection Time: 05/21/20 10:32 AM   Specimen: BLOOD  Result Value Ref Range Status   Specimen Description BLOOD A-LINE  Final   Special Requests   Final    BOTTLES DRAWN AEROBIC AND ANAEROBIC Blood Culture adequate volume   Culture   Final    NO GROWTH 5 DAYS Performed at [facility], [address] [place], [place] [address]    Report Status 05/26/2020 FINAL  Final    Anti-infectives:  Anti-infectives (From admission, onward)   Start     Dose/Rate Route Frequency Ordered Stop   05/28/20 2000  Ampicillin-Sulbactam (UNASYN) 3 g in sodium chloride 0.9 % 100 mL IVPB     Discontinue     3 g 200 mL/hr over 30 Minutes Intravenous Every 6 hours 05/28/20 1638      05/19/20 1900  piperacillin-tazobactam (ZOSYN) IVPB 3.375 g  Status:  Discontinued        3.375 g 12.5 mL/hr over 240 Minutes Intravenous Every 8 hours 05/19/20 1844 05/28/20 1638   05/18/20 1400  meropenem (MERREM) 1 g in sodium chloride 0.9 % 100 mL IVPB  Status:  Discontinued        1 g 200 mL/hr over 30 Minutes Intravenous Every 8 hours 05/18/20 1026 05/19/20 1831   05/17/20 2200  vancomycin (VANCOREADY) IVPB 750 mg/150 mL  Status:  Discontinued        750 mg 150 mL/hr over 60 Minutes Intravenous Every 12 hours 05/17/20 1431 05/19/20 1831   05/17/20 1800  ceFEPIme (MAXIPIME) 2 g in sodium chloride 0.9 % 100 mL IVPB  Status:  Discontinued        2 g 200 mL/hr over 30 Minutes Intravenous Every 8 hours 05/17/20 1443 05/18/20 1026   05/15/20 2200  vancomycin (VANCOREADY) IVPB 500 mg/100 mL  Status:  Discontinued        500 mg 100 mL/hr over 60 Minutes Intravenous Every 12 hours 05/15/20 1911 05/17/20 1431   05/15/20 2000  ceFEPIme (MAXIPIME) 2 g in sodium chloride 0.9 % 100 mL IVPB  Status:  Discontinued        2 g 200 mL/hr over 30 Minutes Intravenous Every 12 hours 05/15/20 1911 05/17/20 1443   05/15/20 1330  vancomycin (VANCOCIN) IVPB 1000 mg/200 mL premix        1,000 mg 200 mL/hr over 60 Minutes Intravenous  Once 05/15/20 1321 05/15/20 1541   05/15/20 1330  piperacillin-tazobactam (ZOSYN) IVPB 3.375 g        3.375 g 100 mL/hr over 30 Minutes Intravenous  Once 05/15/20 1321 05/15/20 1358       Consults: Treatment Team:  Tsosie Billing, MD     PAST MEDICAL HISTORY   Past Medical History:  Diagnosis Date  . Acute  kidney injury (AKI) with acute tubular necrosis (ATN) ([place])   . Acute on chronic combined systolic and diastolic CHF (congestive heart failure) ([place]) 06/06/2018  . Acute on chronic respiratory failure with hypoxia ([place]) 06/06/2018  . ARDS (adult respiratory distress syndrome) ([place]) 06/06/2018  . Aspiration pneumonia due to gastric secretions ([place])   .  Diabetes mellitus ([place])   . History of traumatic brain injury   . Hypothyroidism   . Pneumonia due to Klebsiella pneumoniae ([place]) 06/06/2018  . Seizure disorder ([place])   . Severe sepsis ([place]) 06/06/2018  . Tracheostomy status ([place]) 06/06/2018  . Traumatic brain injury [facility])      SURGICAL HISTORY   Past Surgical History:  Procedure Laterality Date  . PEG PLACEMENT N/A 05/27/2020   Procedure: PERCUTANEOUS ENDOSCOPIC GASTROSTOMY (PEG) PLACEMENT;  Surgeon: Lucilla Lame, MD;  Location: [facility] ENDOSCOPY;  Service: Endoscopy;  Laterality: N/A;  . TRACHEOSTOMY       FAMILY HISTORY   Family History  Family history unknown: Yes     SOCIAL HISTORY   Social History   Tobacco Use  . Smoking status: Never Smoker  . Smokeless tobacco: Never Used  Substance Use Topics  . Alcohol use: Not Currently  . Drug use: Not Currently     MEDICATIONS   Current Medication:  Current Facility-Administered Medications:  .  0.9 %  sodium chloride infusion, 250 mL, Intravenous, Continuous, Opyd, Ilene Qua, MD, Last Rate: 10 mL/hr at 05/29/20 0600, Rate Verify at 05/29/20 0600 .  acetaminophen (TYLENOL) suppository 650 mg, 650 mg, Rectal, Q6H PRN, [place] Eric J, DO, 650 mg at 05/20/20 1825 .  Ampicillin-Sulbactam (UNASYN) 3 g in sodium chloride 0.9 % 100 mL IVPB, 3 g, Intravenous, Q6H, Ravishankar, Jayashree, MD, Last Rate: 200 mL/hr at 05/29/20 0959, 3 g at 05/29/20 0959 .  bisacodyl (DULCOLAX) suppository 10 mg, 10 mg, Rectal, Daily PRN, Bonnell Public Tublu, MD .  carbamazepine (TEGRETOL) chewable tablet 50 mg, 50 mg, Per Tube, QID, Tyler Pita, MD, 50 mg at 05/29/20 1003 .  chlorhexidine gluconate (MEDLINE KIT) (PERIDEX) 0.12 % solution 15 mL, 15 mL, Mouth Rinse, BID, Kasa, Kurian, MD, 15 mL at 05/29/20 1045 .  Chlorhexidine Gluconate Cloth 2 % PADS 6 each, 6 each, Topical, Daily, Ottie Glazier, MD, 6 each at 05/29/20 1045 .  collagenase (SANTYL) ointment, , Topical, Daily, Vashti Hey, MD, Given at 05/29/20 1045 .  docusate (COLACE) 50 MG/5ML liquid 100 mg, 100 mg, Oral, BID, Darel Hong D, NP, 100 mg at 05/26/20 1053 .  feeding supplement (PROSource TF) liquid 45 mL, 45 mL, Per Tube, Daily, Tyler Pita, MD, 45 mL at 05/29/20 1010 .  feeding supplement (VITAL 1.5 CAL) liquid 1,000 mL, 1,000 mL, Per Tube, Continuous, Kasa, Kurian, MD, Last Rate: 50 mL/hr at 05/29/20 0600, Rate Verify at 05/29/20 0600 .  fentaNYL (SUBLIMAZE) injection 50-200 mcg, 50-200 mcg, Intravenous, Q30 min PRN, Darel Hong D, NP, 150 mcg at 05/29/20 0959 .  free water 200 mL, 200 mL, Per Tube, Q4H, Tyler Pita, MD, 200 mL at 05/29/20 0843 .  insulin aspart (novoLOG) injection 0-9 Units, 0-9 Units, Subcutaneous, Q4H, Awilda Bill, NP, 1 Units at 05/28/20 2002 .  levothyroxine (SYNTHROID, LEVOTHROID) injection 50 mcg, 50 mcg, Intravenous, Q0600, [place] Eric J, DO, 50 mcg at 05/29/20 0502 .  magnesium sulfate IVPB 2 g 50 mL, 2 g, Intravenous, Once, Dallie Piles, RPH .  MEDLINE mouth rinse, 15 mL, Mouth Rinse, 10 times per  day, Flora Lipps, MD, 15 mL at 05/29/20 1009 .  midazolam (VERSED) injection 2 mg, 2 mg, Intravenous, Q15 min PRN, Darel Hong D, NP .  midazolam (VERSED) injection 2 mg, 2 mg, Intravenous, Q2H PRN, Darel Hong D, NP .  midodrine (PROAMATINE) tablet 10 mg, 10 mg, Per Tube, TID WC, Kasa, Kurian, MD, 10 mg at 05/29/20 1003 .  morphine 2 MG/ML injection 1 mg, 1 mg, Intravenous, Q2H PRN, Flora Lipps, MD, 1 mg at 05/28/20 1615 .  norepinephrine (LEVOPHED) 16 mg in 258m premix infusion, 0-40 mcg/min, Intravenous, Titrated, Tukov-Yual, Magdalene S, NP, Held at 05/28/20 2102 .  nutrition supplement (JUVEN) (JUVEN) powder packet 1 packet, 1 packet, Per Tube, BID BM, KFlora Lipps MD, 1 packet at 05/29/20 1004 .  pantoprazole (PROTONIX) injection 40 mg, 40 mg, Intravenous, Q24H, KDarel HongD, NP, 40 mg at 05/29/20 0502 .  polyethylene glycol  (MIRALAX / GLYCOLAX) packet 17 g, 17 g, Oral, Daily, SLu Duffel RPH, 17 g at 05/26/20 1054 .  sodium chloride flush (NS) 0.9 % injection 10-40 mL, 10-40 mL, Intracatheter, Q12H, CBonnell PublicTublu, MD, 10 mL at 05/28/20 2125 .  sodium chloride flush (NS) 0.9 % injection 10-40 mL, 10-40 mL, Intracatheter, PRN, CBonnell PublicTublu, MD, 10 mL at 05/25/20 1000    ALLERGIES   Kaopectate  [attapulgite], Thiopental, and Tomato    REVIEW OF SYSTEMS     Unable to obtain due to Critically ill status  PHYSICAL EXAMINATION   Vital Signs: Temp:  [98.1 F (36.7 C)-98.6 F (37 C)] 98.1 F (36.7 C) (08/07 0800) Pulse Rate:  [82-104] 95 (08/07 0600) Resp:  [21-27] 22 (08/07 0800) BP: (80-116)/(51-85) 115/83 (08/07 0600) SpO2:  [95 %-100 %] 100 % (08/07 0600) FiO2 (%):  [35 %] 35 % (08/07 0600) Weight:  [81 kg] 81 kg (08/07 0339)  GENERAL:NAD HEAD: Normocephalic, atraumatic.  EYES: Pupils equal, round, reactive to light.  No scleral icterus.  MOUTH: Moist mucosal membrane. NECK: Supple. No thyromegaly. No nodules. No JVD.  PULMONARY: decreased breath sounds bilaterally  CARDIOVASCULAR: S1 and S2. Regular rate and rhythm. No murmurs, rubs, or gallops.  GASTROINTESTINAL: Soft, nontender, non-distended. No masses. Positive bowel sounds. No hepatosplenomegaly.  MUSCULOSKELETAL: No swelling, clubbing, or edema.  NEUROLOGIC: Mild distress due to acute illness SKIN:intact,warm,dry   PERTINENT DATA     Infusions: . sodium chloride 10 mL/hr at 05/29/20 0600  . ampicillin-sulbactam (UNASYN) IV 3 g (05/29/20 0959)  . feeding supplement (VITAL 1.5 CAL) 50 mL/hr at 05/29/20 0600  . magnesium sulfate bolus IVPB    . norepinephrine (LEVOPHED) Adult infusion Stopped (05/28/20 2102)   Scheduled Medications: . carbamazepine  50 mg Per Tube QID  . chlorhexidine gluconate (MEDLINE KIT)  15 mL Mouth Rinse BID  . Chlorhexidine Gluconate Cloth  6 each Topical Daily  .  collagenase   Topical Daily  . docusate  100 mg Oral BID  . feeding supplement (PROSource TF)  45 mL Per Tube Daily  . free water  200 mL Per Tube Q4H  . insulin aspart  0-9 Units Subcutaneous Q4H  . levothyroxine  50 mcg Intravenous Q0600  . mouth rinse  15 mL Mouth Rinse 10 times per day  . midodrine  10 mg Per Tube TID WC  . nutrition supplement (JUVEN)  1 packet Per Tube BID BM  . pantoprazole (PROTONIX) IV  40 mg Intravenous Q24H  . polyethylene glycol  17 g Oral Daily  . sodium chloride flush  10-40 mL Intracatheter Q12H   PRN Medications: acetaminophen, bisacodyl, fentaNYL (SUBLIMAZE) injection, midazolam, midazolam, morphine injection, sodium chloride flush Hemodynamic parameters:   Intake/Output: 08/06 0701 - 08/07 0700 In: 2116.8 [I.V.:657.2; NG/GT:1221; IV Piggyback:238.6] Out: 1100 [Urine:1100]  Ventilator  Settings: Vent Mode: PCV FiO2 (%):  [35 %] 35 % Set Rate:  [16 bmp] 16 bmp PEEP:  [5 cmH20] 5 cmH20 Plateau Pressure:  [30 cmH20] 30 cmH20    LAB RESULTS:  Basic Metabolic Panel: Recent Labs  Lab 05/25/20 0524 05/25/20 0524 05/26/20 0449 05/26/20 0449 05/27/20 0416 05/27/20 0417 05/27/20 0417 05/28/20 0414 05/29/20 0318  NA 141  --  142  --   --  141  --  141 142  K 3.8   < > 3.4*   < >  --  4.3   < > 3.8 3.7  CL 104  --  105  --   --  105  --  107 106  CO2 31  --  30  --   --  30  --  29 28  GLUCOSE 143*  --  147*  --   --  96  --  79 115*  BUN 12  --  13  --   --  10  --  7 6  CREATININE 0.56  --  0.53  --   --  0.44  --  0.45 0.59  CALCIUM 7.3*  --  7.6*  --   --  7.7*  --  7.3* 7.2*  MG 1.9  --  2.0  --  1.9  --   --  2.0 1.7  PHOS 2.2*  --  2.5  --   --  2.7  --  2.9 2.6   < > = values in this interval not displayed.   Liver Function Tests: Recent Labs  Lab 05/25/20 0524 05/26/20 0449 05/27/20 0417 05/28/20 0414 05/29/20 0318  ALBUMIN 2.0* 2.1* 1.8* 1.6* 1.6*   No results for input(s): LIPASE, AMYLASE in the last 168 hours. No  results for input(s): AMMONIA in the last 168 hours. CBC: Recent Labs  Lab 05/25/20 0524 05/25/20 0524 05/25/20 1631 05/26/20 0449 05/27/20 0416 05/28/20 0414 05/29/20 0318  WBC 11.7*  --   --  11.3* 7.4 6.4 6.6  NEUTROABS 9.7*  --   --  9.0* 6.0 5.2 5.3  HGB 6.5*   < > 8.3* 9.3* 8.6* 8.2* 8.6*  HCT 20.1*   < > 25.3* 27.0* 26.2* 25.1* 26.6*  MCV 90.5  --   --  88.8 92.6 95.4 94.7  PLT 116*  --   --  131* 150 162 215   < > = values in this interval not displayed.   Cardiac Enzymes: No results for input(s): CKTOTAL, CKMB, CKMBINDEX, TROPONINI in the last 168 hours. BNP: Invalid input(s): POCBNP CBG: Recent Labs  Lab 05/28/20 1611 05/28/20 1954 05/28/20 2341 05/29/20 0336 05/29/20 0746  GLUCAP 104* 129* 89 119* 126*       IMAGING RESULTS:  Imaging: DG Chest Port 1 View  Result Date: 05/28/2020 CLINICAL DATA:  Acute respiratory failure EXAM: PORTABLE CHEST 1 VIEW COMPARISON:  05/23/2020. FINDINGS: Tracheostomy tube feeding tube in stable position. Heart size stable. Diffuse bilateral prominent pulmonary infiltrates/edema again noted. Progression of infiltrate in the left upper lung noted. Interim improvement in left base infiltrate. No pleural effusion or pneumothorax. Thoracic spine scoliosis. IMPRESSION: 1.  Tracheostomy tube feeding tube in stable position. 2. Diffuse bilateral prominent pulmonary infiltrates/edema again noted. Progression  of infiltrate left upper lung noted. Interim improvement in left base infiltrate. Electronically Signed   By: Marcello Moores  Register   On: 05/28/2020 05:35   @PROBHOSP @ No results found.   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 - on zosyn - ID on case appreciate input -use vasopressors to keep MAP>65    ID -continue IV abx as prescibed -follow up cultures  GI/Nutrition GI PROPHYLAXIS as indicated DIET-->TF's as tolerated Constipation protocol as indicated  ENDO - ICU hypoglycemic\Hyperglycemia protocol -check FSBS per protocol   ELECTROLYTES -follow labs as needed -replace as needed -pharmacy consultation   DVT/GI PRX ordered -SCDs  TRANSFUSIONS AS NEEDED MONITOR FSBS ASSESS the need for LABS as needed   Critical care provider statement:    Critical care time (minutes):  33   Critical care time was exclusive of:  Separately billable procedures and treating other patients   Critical care was necessary to treat or prevent imminent or life-threatening deterioration of the following conditions:  acute hypoxemia, bacteremia from proteus, tracheostomy status.    Critical care was time spent personally by me on the following activities:  Development of treatment plan with patient or surrogate, discussions with consultants, evaluation of patient's response to treatment, examination of patient, obtaining history from patient or surrogate, ordering and performing treatments and interventions, ordering and review of laboratory studies and re-evaluation of patient's condition.  I assumed direction of critical care for this patient from another provider in my specialty: no    This document was prepared using Dragon voice recognition software and may include unintentional dictation errors.    Ottie Glazier, M.D.  Division of [facility]

## 2020-05-29 NOTE — Plan of Care (Signed)
  Problem: Health Behavior/Discharge Planning: Goal: Ability to manage health-related needs will improve Outcome: Progressing   Problem: Clinical Measurements: Goal: Ability to maintain clinical measurements within normal limits will improve Outcome: Progressing Goal: Will remain free from infection Outcome: Progressing Goal: Respiratory complications will improve Outcome: Progressing Goal: Cardiovascular complication will be avoided Outcome: Progressing   Problem: Nutrition: Goal: Adequate nutrition will be maintained Outcome: Progressing   Problem: Coping: Goal: Level of anxiety will decrease Outcome: Progressing   

## 2020-05-30 LAB — CBC WITH DIFFERENTIAL/PLATELET
Abs Immature Granulocytes: 0.03 10*3/uL (ref 0.00–0.07)
Basophils Absolute: 0 10*3/uL (ref 0.0–0.1)
Basophils Relative: 0 %
Eosinophils Absolute: 0.1 10*3/uL (ref 0.0–0.5)
Eosinophils Relative: 3 %
HCT: 26.4 % — ABNORMAL LOW (ref 36.0–46.0)
Hemoglobin: 8.4 g/dL — ABNORMAL LOW (ref 12.0–15.0)
Immature Granulocytes: 1 %
Lymphocytes Relative: 17 %
Lymphs Abs: 0.9 10*3/uL (ref 0.7–4.0)
MCH: 30.4 pg (ref 26.0–34.0)
MCHC: 31.8 g/dL (ref 30.0–36.0)
MCV: 95.7 fL (ref 80.0–100.0)
Monocytes Absolute: 0.6 10*3/uL (ref 0.1–1.0)
Monocytes Relative: 12 %
Neutro Abs: 3.7 10*3/uL (ref 1.7–7.7)
Neutrophils Relative %: 67 %
Platelets: 250 10*3/uL (ref 150–400)
RBC: 2.76 MIL/uL — ABNORMAL LOW (ref 3.87–5.11)
RDW: 19.5 % — ABNORMAL HIGH (ref 11.5–15.5)
WBC: 5.4 10*3/uL (ref 4.0–10.5)
nRBC: 0 % (ref 0.0–0.2)

## 2020-05-30 LAB — GLUCOSE, CAPILLARY
Glucose-Capillary: 107 mg/dL — ABNORMAL HIGH (ref 70–99)
Glucose-Capillary: 109 mg/dL — ABNORMAL HIGH (ref 70–99)
Glucose-Capillary: 112 mg/dL — ABNORMAL HIGH (ref 70–99)
Glucose-Capillary: 112 mg/dL — ABNORMAL HIGH (ref 70–99)
Glucose-Capillary: 116 mg/dL — ABNORMAL HIGH (ref 70–99)
Glucose-Capillary: 120 mg/dL — ABNORMAL HIGH (ref 70–99)
Glucose-Capillary: 90 mg/dL (ref 70–99)

## 2020-05-30 LAB — RENAL FUNCTION PANEL
Albumin: 1.5 g/dL — ABNORMAL LOW (ref 3.5–5.0)
Anion gap: 8 (ref 5–15)
BUN: <5 mg/dL — ABNORMAL LOW (ref 6–20)
CO2: 29 mmol/L (ref 22–32)
Calcium: 7.4 mg/dL — ABNORMAL LOW (ref 8.9–10.3)
Chloride: 106 mmol/L (ref 98–111)
Creatinine, Ser: 0.45 mg/dL (ref 0.44–1.00)
GFR calc Af Amer: >60 mL/min (ref >60)
GFR calc non Af Amer: >60 mL/min (ref >60)
Glucose, Bld: 104 mg/dL — ABNORMAL HIGH (ref 70–99)
Phosphorus: 2.5 mg/dL (ref 2.5–4.6)
Potassium: 3.7 mmol/L (ref 3.5–5.1)
Sodium: 143 mmol/L (ref 135–145)

## 2020-05-30 LAB — HEPATIC FUNCTION PANEL
ALT: 15 U/L (ref 0–44)
AST: 18 U/L (ref 15–41)
Albumin: 1.5 g/dL — ABNORMAL LOW (ref 3.5–5.0)
Alkaline Phosphatase: 61 U/L (ref 38–126)
Bilirubin, Direct: <0.1 mg/dL (ref 0.0–0.2)
Total Bilirubin: 0.6 mg/dL (ref 0.3–1.2)
Total Protein: 5.8 g/dL — ABNORMAL LOW (ref 6.5–8.1)

## 2020-05-30 LAB — CARBAMAZEPINE LEVEL, TOTAL: Carbamazepine Lvl: <2 ug/mL — ABNORMAL LOW (ref 4.0–12.0)

## 2020-05-30 LAB — MAGNESIUM: Magnesium: 2.4 mg/dL (ref 1.7–2.4)

## 2020-05-30 MED ORDER — LACTATED RINGERS IV BOLUS
1000.0000 mL | Freq: Once | INTRAVENOUS | Status: AC
  Administered 2020-05-30: 1000 mL via INTRAVENOUS

## 2020-05-30 NOTE — Progress Notes (Signed)
Femoral CVC removed as per protocol. Hemostasis obtained after 5 mins of applying pressure. Site intact and dry. Pressure dressing applied to site. No signs of hematoma, will continue to monitor.. LR 1 liter given as pt is having type 7 stools x 3 days.

## 2020-05-30 NOTE — Progress Notes (Signed)
CRITICAL CARE PROGRESS NOTE    Name: Joanne [name] MRN: [id number] DOB: 1971-01-23     LOS: 64   SUBJECTIVE FINDINGS & SIGNIFICANT EVENTS     BRIEF PATIENT DESCRIPTION:  49yo F with TBI , trach status, seizure disorder as well as multiple additional comorbid conditions admitted with Septic Shock due to Proteus bacteremiaand peptostreptococcus in setting of Stage IV Sacral Decubitus with Osteomyelitis.  SIGNIFICANT EVENTS/STUDIES:  07/23: Pt admitted to stepdown unit with sepsis but remained housed in the ER pending bed availability  07/24: CT Pelvis revealed large sacral decubitus ulcer which has progressed significantly since prior study. Extension to the underlying sacrum and coccyx with evidence of osteomyelitis and bone destruction. No evidence of fluid collection or abscess. Stable midline ventral hernia as without evidence of bowel obstruction or strangulation. 07/24: Pt with hypotension PCCM team consulted to assist with management  7/26: General Surgery consulted, performed bedside debridement 7/26: Palliative Care consulted, pt's POA requests pt remain Full Code with aggressive medical interventions 7/27: ID consulted due to Proteus & peptostreptococcus  Bacteremia . Weaned off Vasopressors 7/28: Transfer out to Med-Surg unit 7/29: RR called due to Hypoxia in setting of pulmonary edema; transfer to ICU; PCCM reconsulted 7/29: Cuffless shiley exchanged for Cuffed shiley, placed on vent; Hypotensive requiring vasopressors 7/30: Left Femoral CVC & Left Femoral A-line placed 8/01:A-line D/C 05/28/20- GI unable to pass NGT, defer to IR.  05/29/20 - reviewed nourishment needs with RD, will increase rate of Feeds per Dobhoff 05/30/20 - no events today continue current scope of care  Antimicrobials this  admission: 7/24 cefepime >> 7/27 7/27 meropenem >> 7/28 7/24 vancomycin >>7/28  7/28 Zosyn >>  Microbiology results: 7/24 BCx: peptostreptococcus species, proteus mirabilis 7/27 BCx: NGTD 7/30 BCx: NGTD 7/25 MRSA PCR: negative  Media Information   Document Information  Photo-  large sacral decubitus - present on admission      : CVC Triple Lumen 05/21/20 Left Femoral (Active)  Indication for Insertion or Continuance of Line Limited venous access - need for IV therapy >5 days (PICC only) 05/27/20 1918  Site Assessment Clean;Dry;Intact 05/27/20 1918  Proximal Lumen Status Infusing 05/27/20 1918  Medial Lumen Status Saline locked 05/27/20 1918  Distal Lumen Status In-line blood sampling system in place 05/27/20 1918  Dressing Type Transparent 05/27/20 1918  Dressing Status Clean;Dry;Intact;Antimicrobial disc in place 05/27/20 [place] checked and tightened 05/27/20 1918  Dressing Intervention New dressing;Dressing changed;Antimicrobial disc changed;Dressing reinforced 05/28/20 0500  Dressing Change Due 06/04/20 05/28/20 0500     NG/OG Tube Nasogastric 8 Fr. Right nare Xray Documented cm marking at nare/ corner of mouth (Active)  Cm Marking at Nare/Corner of Mouth (if applicable) 70 cm 11/57/26 0751  External Length of Tube (cm) - (if applicable) 70 cm 20/35/59 0751  Site Assessment Clean;Dry 05/28/20 0751  Ongoing Placement Verification Xray 05/28/20 0751  Status Clamped 05/28/20 0751  Drainage Appearance None 05/27/20 0800  Intake (mL) 60 mL 05/20/20 2200  Output (mL) 70 mL 05/27/20 1800     Urethral Catheter Wells Guiles, RN  Latex 14 Fr. (Active)  Indication for Insertion or Continuance of Catheter Chronic catheter use 05/28/20 0751  Site Assessment Clean;Intact 05/28/20 0751  Catheter Maintenance Bag below level of bladder;Catheter secured;Drainage bag/tubing not touching floor;Insertion date on drainage bag;No dependent loops 05/28/20 0751   Collection Container Standard drainage bag 05/28/20 0751  Securement Method Securing device (Describe) 05/28/20 0400  Urinary Catheter Interventions (if applicable) Unclamped 74/16/38 0400  Output (mL) 125 mL 05/28/20 0626    Microbiology/Sepsis markers: Results for orders placed or performed during the hospital encounter of 05/15/20  Blood Culture (routine x 2)     Status: None   Collection Time: 05/15/20  1:29 PM   Specimen: BLOOD  Result Value Ref Range Status   Specimen Description BLOOD R ARM  Final   Special Requests   Final    BOTTLES DRAWN AEROBIC AND ANAEROBIC Blood Culture results may not be optimal due to an excessive volume of blood received in culture bottles   Culture   Final    NO GROWTH 5 DAYS Performed at [facility], [address] [place], [place] [address]    Report Status 05/20/2020 FINAL  Final  Blood Culture (routine x 2)     Status: Abnormal   Collection Time: 05/15/20  1:29 PM   Specimen: BLOOD  Result Value Ref Range Status   Specimen Description   Final    BLOOD L AC Performed at [facility], [address] [place], [place] [address]    Special Requests   Final    BOTTLES DRAWN AEROBIC AND ANAEROBIC Blood Culture results may not be optimal due to an inadequate volume of blood received in culture bottles Performed at [facility], [address] [place], [place] [address]    Culture  Setup Time   Final    Organism ID to follow [place] TO, READ BACK BY AND VERIFIED WITH: MYRA SLAUGHTER ON 05/16/2020 AT [place] ONLY CRITICAL RESULT CALLED TO, READ BACK BY AND VERIFIED WITH: DAVID BASANTI 05/18/20 0520 SJL/RWW Performed at [facility], [address] [place], [place] [address]    Culture PEPTOSTREPTOCOCCUS SPECIES PROTEUS MIRABILIS  (A)  Final   Report Status 05/20/2020 FINAL  Final   Organism ID, Bacteria  PROTEUS MIRABILIS  Final      Susceptibility   Proteus mirabilis - MIC*    AMPICILLIN <=2 SENSITIVE Sensitive     CEFAZOLIN 8 SENSITIVE Sensitive     CEFEPIME <=0.12 SENSITIVE Sensitive     CEFTAZIDIME <=1 SENSITIVE Sensitive     CEFTRIAXONE <=0.25 SENSITIVE Sensitive     CIPROFLOXACIN >=4 RESISTANT Resistant     GENTAMICIN <=1 SENSITIVE Sensitive     IMIPENEM 4 SENSITIVE Sensitive     TRIMETH/SULFA 80 RESISTANT Resistant     AMPICILLIN/SULBACTAM <=2 SENSITIVE Sensitive     PIP/TAZO <=4 SENSITIVE Sensitive     * PROTEUS MIRABILIS  Blood Culture ID Panel (Reflexed)     Status: None   Collection Time: 05/15/20  1:29 PM  Result Value Ref Range Status   Enterococcus species NOT DETECTED NOT DETECTED Final   Listeria monocytogenes NOT DETECTED NOT DETECTED Final   Staphylococcus species NOT DETECTED NOT DETECTED Final   Staphylococcus aureus (BCID) NOT DETECTED NOT DETECTED Final   Streptococcus species NOT DETECTED NOT DETECTED Final   Streptococcus agalactiae NOT DETECTED NOT DETECTED Final   Streptococcus pneumoniae NOT DETECTED NOT DETECTED Final   Streptococcus pyogenes NOT DETECTED NOT DETECTED Final   Acinetobacter baumannii NOT DETECTED NOT DETECTED Final   Enterobacteriaceae species NOT DETECTED NOT DETECTED Final   Enterobacter cloacae complex NOT DETECTED NOT DETECTED Final   Escherichia coli NOT DETECTED NOT DETECTED Final   Klebsiella oxytoca NOT DETECTED NOT DETECTED Final   Klebsiella pneumoniae NOT DETECTED NOT DETECTED Final   Proteus species NOT DETECTED NOT  DETECTED Final   Serratia marcescens NOT DETECTED NOT DETECTED Final   Haemophilus influenzae NOT DETECTED NOT DETECTED Final   Neisseria meningitidis NOT DETECTED NOT DETECTED Final   Pseudomonas aeruginosa NOT DETECTED NOT DETECTED Final   Candida albicans NOT DETECTED NOT DETECTED Final   Candida glabrata NOT DETECTED NOT DETECTED Final   Candida krusei NOT DETECTED NOT DETECTED Final   Candida parapsilosis  NOT DETECTED NOT DETECTED Final   Candida tropicalis NOT DETECTED NOT DETECTED Final    Comment: Performed at [facility], [address] [place], [place] [address]  Blood Culture ID Panel (Reflexed)     Status: Abnormal   Collection Time: 05/15/20  1:29 PM  Result Value Ref Range Status   Enterococcus species NOT DETECTED NOT DETECTED Final   Listeria monocytogenes NOT DETECTED NOT DETECTED Final   Staphylococcus species NOT DETECTED NOT DETECTED Final   Staphylococcus aureus (BCID) NOT DETECTED NOT DETECTED Final   Streptococcus species NOT DETECTED NOT DETECTED Final   Streptococcus agalactiae NOT DETECTED NOT DETECTED Final   Streptococcus pneumoniae NOT DETECTED NOT DETECTED Final   Streptococcus pyogenes NOT DETECTED NOT DETECTED Final   Acinetobacter baumannii NOT DETECTED NOT DETECTED Final   Enterobacteriaceae species DETECTED (A) NOT DETECTED Final    Comment: Enterobacteriaceae represent a large family of gram-negative bacteria, not a single organism. CRITICAL RESULT CALLED TO, READ BACK BY AND VERIFIED WITH: DAVID BASANTI 05/18/20 0520 SJL    Enterobacter cloacae complex NOT DETECTED NOT DETECTED Final   Escherichia coli NOT DETECTED NOT DETECTED Final   Klebsiella oxytoca NOT DETECTED NOT DETECTED Final   Klebsiella pneumoniae NOT DETECTED NOT DETECTED Final   Proteus species DETECTED (A) NOT DETECTED Final    Comment: CRITICAL RESULT CALLED TO, READ BACK BY AND VERIFIED WITH: DAVID BASANTI 05/18/20 0520 SJL    Serratia marcescens NOT DETECTED NOT DETECTED Final   Carbapenem resistance NOT DETECTED NOT DETECTED Final   Haemophilus influenzae NOT DETECTED NOT DETECTED Final   Neisseria meningitidis NOT DETECTED NOT DETECTED Final   Pseudomonas aeruginosa NOT DETECTED NOT DETECTED Final   Candida albicans NOT DETECTED NOT DETECTED Final   Candida glabrata NOT DETECTED NOT DETECTED Final   Candida krusei NOT DETECTED NOT DETECTED Final   Candida parapsilosis  NOT DETECTED NOT DETECTED Final   Candida tropicalis NOT DETECTED NOT DETECTED Final    Comment: Performed at [facility], [address], [place], [place] [address]  Urine culture     Status: Abnormal   Collection Time: 05/15/20  2:03 PM   Specimen: In/Out Cath Urine  Result Value Ref Range Status   Specimen Description   Final    IN/OUT CATH URINE Performed at [facility], [address] [place], [place] [address]    Special Requests   Final    NONE Performed at [facility], [address] [place], [place] [address]    Culture (A)  Final    <10,000 COLONIES/mL INSIGNIFICANT GROWTH Performed at [facility], [place] [address] [place], [place] [address]    Report Status 05/16/2020 FINAL  Final  SARS Coronavirus 2 by RT PCR (hospital order, performed in [facility] hospital lab) Nasopharyngeal Nasopharyngeal Swab     Status: None   Collection Time: 05/16/20 12:07 AM   Specimen: Nasopharyngeal Swab  Result Value Ref Range Status   SARS Coronavirus 2 NEGATIVE NEGATIVE Final    Comment: (NOTE) SARS-CoV-2 target nucleic acids are NOT DETECTED.  The  SARS-CoV-2 RNA is generally detectable in upper and lower respiratory specimens during the acute phase of infection. The lowest concentration of SARS-CoV-2 viral copies this assay can detect is 250 copies / mL. A negative result does not preclude SARS-CoV-2 infection and should not be used as the sole basis for treatment or other patient management decisions.  A negative result may occur with improper specimen collection / handling, submission of specimen other than nasopharyngeal swab, presence of viral mutation(s) within the areas targeted by this assay, and inadequate number of viral copies (<250 copies / mL). A negative result must be combined with clinical observations, patient history, and epidemiological information.  Fact Sheet for Patients:    StrictlyIdeas.no  Fact Sheet for Healthcare Providers: BankingDealers.co.za  This test is not yet approved or  cleared by the [place] FDA and has been authorized for detection and/or diagnosis of SARS-CoV-2 by FDA under an Emergency Use Authorization (EUA).  This EUA will remain in effect (meaning this test can be used) for the duration of the COVID-19 declaration under Section 564(b)(1) of the Act, 21 U.S.C. section 360bbb-3(b)(1), unless the authorization is terminated or revoked sooner.  Performed at [facility], [address] [place], [place] [address]   MRSA PCR Screening     Status: None   Collection Time: 05/16/20  2:37 PM   Specimen: Nasopharyngeal  Result Value Ref Range Status   MRSA by PCR NEGATIVE NEGATIVE Final    Comment:        The GeneXpert MRSA Assay (FDA approved for NASAL specimens only), is one component of a comprehensive MRSA colonization surveillance program. It is not intended to diagnose MRSA infection nor to guide or monitor treatment for MRSA infections. Performed at [facility], [address] [place], [place] [address]   CULTURE, BLOOD (ROUTINE X 2) w Reflex to ID Panel     Status: None   Collection Time: 05/18/20 12:46 AM   Specimen: BLOOD  Result Value Ref Range Status   Specimen Description BLOOD RIGHT HAND  Final   Special Requests BOTTLES DRAWN AEROBIC AND ANAEROBIC BCAV  Final   Culture   Final    NO GROWTH 5 DAYS Performed at [facility], [address] [place], [place] [address]    Report Status 05/23/2020 FINAL  Final  CULTURE, BLOOD (ROUTINE X 2) w Reflex to ID Panel     Status: None   Collection Time: 05/18/20  6:57 AM   Specimen: BLOOD  Result Value Ref Range Status   Specimen Description BLOOD RIGHT HAND  Final   Special Requests   Final    BOTTLES DRAWN AEROBIC ONLY Blood Culture adequate volume   Culture   Final    NO GROWTH 5  DAYS Performed at [facility], [address], [place], [place] [address]    Report Status 05/23/2020 FINAL  Final  Culture, respiratory     Status: None   Collection Time: 05/20/20  3:09 AM   Specimen: Tracheal Aspirate; Respiratory  Result Value Ref Range Status   Specimen Description   Final    TRACHEAL ASPIRATE Performed at [facility], [address] [place], [place] [address]    Special Requests   Final    NONE Performed at [facility], [address] [place], [place] [address]    Gram Stain   Final    RARE WBC PRESENT, PREDOMINANTLY MONONUCLEAR NO ORGANISMS SEEN Performed at [facility], [place]  [address] [place], [place] [address]    Culture RARE CANDIDA ALBICANS  Final   Report Status 05/24/2020 FINAL  Final  Culture, blood (Routine X 2) w Reflex to ID Panel     Status: None   Collection Time: 05/21/20  9:14 AM   Specimen: BLOOD  Result Value Ref Range Status   Specimen Description BLOOD A-LINE DRAW  Final   Special Requests   Final    BOTTLES DRAWN AEROBIC AND ANAEROBIC Blood Culture adequate volume   Culture   Final    NO GROWTH 5 DAYS Performed at [facility], [address], [place], [place] [address]    Report Status 05/26/2020 FINAL  Final  Culture, blood (Routine X 2) w Reflex to ID Panel     Status: None   Collection Time: 05/21/20 10:32 AM   Specimen: BLOOD  Result Value Ref Range Status   Specimen Description BLOOD A-LINE  Final   Special Requests   Final    BOTTLES DRAWN AEROBIC AND ANAEROBIC Blood Culture adequate volume   Culture   Final    NO GROWTH 5 DAYS Performed at [facility], [address], [place], [place] [address]    Report Status 05/26/2020 FINAL  Final    Anti-infectives:  Anti-infectives (From admission, onward)   Start     Dose/Rate Route Frequency Ordered Stop   05/28/20 2000  Ampicillin-Sulbactam (UNASYN) 3 g in sodium chloride 0.9 % 100 mL IVPB     Discontinue      3 g 200 mL/hr over 30 Minutes Intravenous Every 6 hours 05/28/20 1638     05/19/20 1900  piperacillin-tazobactam (ZOSYN) IVPB 3.375 g  Status:  Discontinued        3.375 g 12.5 mL/hr over 240 Minutes Intravenous Every 8 hours 05/19/20 1844 05/28/20 1638   05/18/20 1400  meropenem (MERREM) 1 g in sodium chloride 0.9 % 100 mL IVPB  Status:  Discontinued        1 g 200 mL/hr over 30 Minutes Intravenous Every 8 hours 05/18/20 1026 05/19/20 1831   05/17/20 2200  vancomycin (VANCOREADY) IVPB 750 mg/150 mL  Status:  Discontinued        750 mg 150 mL/hr over 60 Minutes Intravenous Every 12 hours 05/17/20 1431 05/19/20 1831   05/17/20 1800  ceFEPIme (MAXIPIME) 2 g in sodium chloride 0.9 % 100 mL IVPB  Status:  Discontinued        2 g 200 mL/hr over 30 Minutes Intravenous Every 8 hours 05/17/20 1443 05/18/20 1026   05/15/20 2200  vancomycin (VANCOREADY) IVPB 500 mg/100 mL  Status:  Discontinued        500 mg 100 mL/hr over 60 Minutes Intravenous Every 12 hours 05/15/20 1911 05/17/20 1431   05/15/20 2000  ceFEPIme (MAXIPIME) 2 g in sodium chloride 0.9 % 100 mL IVPB  Status:  Discontinued        2 g 200 mL/hr over 30 Minutes Intravenous Every 12 hours 05/15/20 1911 05/17/20 1443   05/15/20 1330  vancomycin (VANCOCIN) IVPB 1000 mg/200 mL premix        1,000 mg 200 mL/hr over 60 Minutes Intravenous  Once 05/15/20 1321 05/15/20 1541   05/15/20 1330  piperacillin-tazobactam (ZOSYN) IVPB 3.375 g        3.375 g 100 mL/hr over 30 Minutes Intravenous  Once 05/15/20 1321 05/15/20 1358       Consults: Treatment Team:  Tsosie Billing, MD     [place]  Past Medical History:  Diagnosis Date  . Acute kidney injury (AKI) with acute tubular necrosis (ATN) (HCC)   . Acute on chronic combined systolic and diastolic CHF (congestive heart failure) ([place]) 06/06/2018  . Acute on chronic respiratory failure with hypoxia ([place]) 06/06/2018  . ARDS (adult respiratory distress syndrome) ([place])  06/06/2018  . Aspiration pneumonia due to gastric secretions ([place])   . Diabetes mellitus ([place])   . History of traumatic brain injury   . Hypothyroidism   . Pneumonia due to Klebsiella pneumoniae ([place]) 06/06/2018  . Seizure disorder ([place])   . Severe sepsis ([place]) 06/06/2018  . Tracheostomy status ([place]) 06/06/2018  . Traumatic brain injury [facility])      SURGICAL HISTORY   Past Surgical History:  Procedure Laterality Date  . PEG PLACEMENT N/A 05/27/2020   Procedure: PERCUTANEOUS ENDOSCOPIC GASTROSTOMY (PEG) PLACEMENT;  Surgeon: Lucilla Lame, MD;  Location: [facility] ENDOSCOPY;  Service: Endoscopy;  Laterality: N/A;  . TRACHEOSTOMY       FAMILY HISTORY   Family History  Family history unknown: Yes     SOCIAL HISTORY   Social History   Tobacco Use  . Smoking status: Never Smoker  . Smokeless tobacco: Never Used  Substance Use Topics  . Alcohol use: Not Currently  . Drug use: Not Currently     MEDICATIONS   Current Medication:  Current Facility-Administered Medications:  .  0.9 %  sodium chloride infusion, 250 mL, Intravenous, Continuous, Opyd, Ilene Qua, MD, Last Rate: 10 mL/hr at 05/30/20 0600, Rate Verify at 05/30/20 0600 .  acetaminophen (TYLENOL) suppository 650 mg, 650 mg, Rectal, Q6H PRN, [place] Eric J, DO, 650 mg at 05/20/20 1825 .  Ampicillin-Sulbactam (UNASYN) 3 g in sodium chloride 0.9 % 100 mL IVPB, 3 g, Intravenous, Q6H, Ravishankar, Jayashree, MD, Last Rate: 200 mL/hr at 05/30/20 1443, 3 g at 05/30/20 1443 .  bisacodyl (DULCOLAX) suppository 10 mg, 10 mg, Rectal, Daily PRN, Bonnell Public Tublu, MD .  carbamazepine (TEGRETOL) chewable tablet 50 mg, 50 mg, Per Tube, QID, Tyler Pita, MD, 50 mg at 05/30/20 1737 .  chlorhexidine gluconate (MEDLINE KIT) (PERIDEX) 0.12 % solution 15 mL, 15 mL, Mouth Rinse, BID, Kasa, Kurian, MD, 15 mL at 05/30/20 1100 .  Chlorhexidine Gluconate Cloth 2 % PADS 6 each, 6 each, Topical, Daily, Ottie Glazier, MD, 6 each at  05/30/20 1001 .  collagenase (SANTYL) ointment, , Topical, Daily, Vashti Hey, MD, Given at 05/30/20 1001 .  docusate (COLACE) 50 MG/5ML liquid 100 mg, 100 mg, Oral, BID, Darel Hong D, NP, 100 mg at 05/26/20 1053 .  feeding supplement (PROSource TF) liquid 45 mL, 45 mL, Per Tube, Daily, Tyler Pita, MD, 45 mL at 05/30/20 1002 .  feeding supplement (VITAL 1.5 CAL) liquid 1,000 mL, 1,000 mL, Per Tube, Continuous, Kasa, Kurian, MD, Last Rate: 50 mL/hr at 05/30/20 1200, 1,000 mL at 05/30/20 1200 .  fentaNYL (SUBLIMAZE) injection 50-200 mcg, 50-200 mcg, Intravenous, Q30 min PRN, Darel Hong D, NP, 150 mcg at 05/29/20 0959 .  free water 200 mL, 200 mL, Per Tube, Q4H, Tyler Pita, MD, 200 mL at 05/30/20 1737 .  insulin aspart (novoLOG) injection 0-9 Units, 0-9 Units, Subcutaneous, Q4H, Awilda Bill, NP, 1 Units at 05/28/20 2002 .  levothyroxine (SYNTHROID, LEVOTHROID) injection 50 mcg, 50 mcg, Intravenous, Q0600, [place] Eric J, DO, 50 mcg at 05/30/20 1062 .  MEDLINE mouth rinse, 15 mL, Mouth Rinse, 10 times per day, Flora Lipps, MD, 15 mL at 05/30/20  1740 .  midazolam (VERSED) injection 2 mg, 2 mg, Intravenous, Q15 min PRN, Darel Hong D, NP .  midazolam (VERSED) injection 2 mg, 2 mg, Intravenous, Q2H PRN, Darel Hong D, NP .  midodrine (PROAMATINE) tablet 10 mg, 10 mg, Per Tube, TID WC, Flora Lipps, MD, 10 mg at 05/30/20 1740 .  morphine 2 MG/ML injection 1 mg, 1 mg, Intravenous, Q2H PRN, Flora Lipps, MD, 1 mg at 05/28/20 1615 .  norepinephrine (LEVOPHED) 16 mg in 240m premix infusion, 0-40 mcg/min, Intravenous, Titrated, Tukov-Yual, Magdalene S, NP, Held at 05/28/20 2102 .  nutrition supplement (JUVEN) (JUVEN) powder packet 1 packet, 1 packet, Per Tube, BID BM, KFlora Lipps MD, 1 packet at 05/30/20 1443 .  pantoprazole (PROTONIX) injection 40 mg, 40 mg, Intravenous, Q24H, KDarel HongD, NP, 40 mg at 05/30/20 0509 .  polyethylene glycol (MIRALAX  / GLYCOLAX) packet 17 g, 17 g, Oral, Daily, SLu Duffel RPH, 17 g at 05/26/20 1054 .  sodium chloride flush (NS) 0.9 % injection 10-40 mL, 10-40 mL, Intracatheter, Q12H, CBonnell PublicTublu, MD, 10 mL at 05/30/20 1003 .  sodium chloride flush (NS) 0.9 % injection 10-40 mL, 10-40 mL, Intracatheter, PRN, CBonnell PublicTublu, MD, 10 mL at 05/25/20 1000    ALLERGIES   Kaopectate  [attapulgite], Thiopental, and Tomato    REVIEW OF SYSTEMS     Unable to obtain due to Critically ill status  PHYSICAL EXAMINATION   Vital Signs: Temp:  [98.2 F (36.8 C)-99 F (37.2 C)] 98.9 F (37.2 C) (08/08 1600) Pulse Rate:  [69-104] 96 (08/08 1700) Resp:  [24-27] 25 (08/08 1300) BP: (84-123)/(61-91) 99/69 (08/08 1700) SpO2:  [96 %-100 %] 97 % (08/08 1700) FiO2 (%):  [35 %] 35 % (08/08 1342) Weight:  [81.2 kg] 81.2 kg (08/08 0411)  GENERAL:NAD HEAD: Normocephalic, atraumatic.  EYES: Pupils equal, round, reactive to light.  No scleral icterus.  MOUTH: Moist mucosal membrane. NECK: Supple. No thyromegaly. No nodules. No JVD.  PULMONARY: decreased breath sounds bilaterally  CARDIOVASCULAR: S1 and S2. Regular rate and rhythm. No murmurs, rubs, or gallops.  GASTROINTESTINAL: Soft, nontender, non-distended. No masses. Positive bowel sounds. No hepatosplenomegaly.  MUSCULOSKELETAL: No swelling, clubbing, or edema.  NEUROLOGIC: Mild distress due to acute illness SKIN:intact,warm,dry   PERTINENT DATA     Infusions: . sodium chloride 10 mL/hr at 05/30/20 0600  . ampicillin-sulbactam (UNASYN) IV 3 g (05/30/20 1443)  . feeding supplement (VITAL 1.5 CAL) 1,000 mL (05/30/20 1200)  . norepinephrine (LEVOPHED) Adult infusion Stopped (05/28/20 2102)   Scheduled Medications: . carbamazepine  50 mg Per Tube QID  . chlorhexidine gluconate (MEDLINE KIT)  15 mL Mouth Rinse BID  . Chlorhexidine Gluconate Cloth  6 each Topical Daily  . collagenase   Topical Daily  . docusate  100 mg  Oral BID  . feeding supplement (PROSource TF)  45 mL Per Tube Daily  . free water  200 mL Per Tube Q4H  . insulin aspart  0-9 Units Subcutaneous Q4H  . levothyroxine  50 mcg Intravenous Q0600  . mouth rinse  15 mL Mouth Rinse 10 times per day  . midodrine  10 mg Per Tube TID WC  . nutrition supplement (JUVEN)  1 packet Per Tube BID BM  . pantoprazole (PROTONIX) IV  40 mg Intravenous Q24H  . polyethylene glycol  17 g Oral Daily  . sodium chloride flush  10-40 mL Intracatheter Q12H   PRN Medications: acetaminophen, bisacodyl, fentaNYL (SUBLIMAZE) injection, midazolam, midazolam, morphine injection,  sodium chloride flush Hemodynamic parameters:   Intake/Output: 08/07 0701 - 08/08 0700 In: 3372.7 [I.V.:220; NG/GT:1800; IV Piggyback:1352.7] Out: 1450 [Urine:1450]  Ventilator  Settings: Vent Mode: PCV FiO2 (%):  [35 %] 35 % Set Rate:  [16 bmp] 16 bmp PEEP:  [5 cmH20] 5 cmH20 Plateau Pressure:  [24 cmH20-32 cmH20] 32 cmH20    LAB RESULTS:  Basic Metabolic Panel: Recent Labs  Lab 05/26/20 0449 05/26/20 0449 05/27/20 0416 05/27/20 0417 05/27/20 0417 05/28/20 0414 05/28/20 0414 05/29/20 0318 05/30/20 0300  NA 142  --   --  141  --  141  --  142 143  K 3.4*   < >  --  4.3   < > 3.8   < > 3.7 3.7  CL 105  --   --  105  --  107  --  106 106  CO2 30  --   --  30  --  29  --  28 29  GLUCOSE 147*  --   --  96  --  79  --  115* 104*  BUN 13  --   --  10  --  7  --  6 <5*  CREATININE 0.53  --   --  0.44  --  0.45  --  0.59 0.45  CALCIUM 7.6*  --   --  7.7*  --  7.3*  --  7.2* 7.4*  MG 2.0  --  1.9  --   --  2.0  --  1.7 2.4  PHOS 2.5  --   --  2.7  --  2.9  --  2.6 2.5   < > = values in this interval not displayed.   Liver Function Tests: Recent Labs  Lab 05/26/20 0449 05/27/20 0417 05/28/20 0414 05/29/20 0318 05/30/20 0300  AST  --   --   --   --  18  ALT  --   --   --   --  15  ALKPHOS  --   --   --   --  61  BILITOT  --   --   --   --  0.6  PROT  --   --   --   --   5.8*  ALBUMIN 2.1* 1.8* 1.6* 1.6* 1.5*  1.5*   No results for input(s): LIPASE, AMYLASE in the last 168 hours. No results for input(s): AMMONIA in the last 168 hours. CBC: Recent Labs  Lab 05/26/20 0449 05/27/20 0416 05/28/20 0414 05/29/20 0318 05/30/20 0300  WBC 11.3* 7.4 6.4 6.6 5.4  NEUTROABS 9.0* 6.0 5.2 5.3 3.7  HGB 9.3* 8.6* 8.2* 8.6* 8.4*  HCT 27.0* 26.2* 25.1* 26.6* 26.4*  MCV 88.8 92.6 95.4 94.7 95.7  PLT 131* 150 162 215 250   Cardiac Enzymes: No results for input(s): CKTOTAL, CKMB, CKMBINDEX, TROPONINI in the last 168 hours. BNP: Invalid input(s): POCBNP CBG: Recent Labs  Lab 05/30/20 0014 05/30/20 0400 05/30/20 0724 05/30/20 1131 05/30/20 1603  GLUCAP 107* 112* 120* 112* 116*       IMAGING RESULTS:  Imaging: No results found. @PROBHOSP @ No results found.   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 - on zosyn - ID on case appreciate input -use vasopressors to keep MAP>65    ID -continue IV abx as prescibed -follow up cultures  GI/Nutrition GI PROPHYLAXIS as indicated DIET-->TF's as tolerated Constipation protocol as indicated  ENDO - ICU hypoglycemic\Hyperglycemia protocol -check FSBS per protocol   ELECTROLYTES -follow labs as needed -replace as needed -pharmacy consultation   DVT/GI PRX ordered -SCDs  TRANSFUSIONS AS NEEDED MONITOR FSBS ASSESS the need for LABS as needed   Critical care provider statement:    Critical care time (minutes):  33   Critical care time was exclusive of:   Separately billable procedures and treating other patients   Critical care was necessary to treat or prevent imminent or life-threatening deterioration of the following conditions:  acute hypoxemia, bacteremia from proteus, tracheostomy status.    Critical care was time spent personally by me on the following activities:  Development of treatment plan with patient or surrogate, discussions with consultants, evaluation of patient's response to treatment, examination of patient, obtaining history from patient or surrogate, ordering and performing treatments and interventions, ordering and review of laboratory studies and re-evaluation of patient's condition.  I assumed direction of critical care for this patient from another provider in my specialty: no    This document was prepared using Dragon voice recognition software and may include unintentional dictation errors.    Ottie Glazier, M.D.  Division of [facility]

## 2020-05-30 NOTE — Plan of Care (Signed)
  Problem: Clinical Measurements: Goal: Respiratory complications will improve Outcome: Progressing Goal: Cardiovascular complication will be avoided Outcome: Progressing   Problem: Activity: Goal: Risk for activity intolerance will decrease Outcome: Progressing   Problem: Nutrition: Goal: Adequate nutrition will be maintained Outcome: Progressing   Problem: Coping: Goal: Level of anxiety will decrease Outcome: Progressing   Problem: Elimination: Goal: Will not experience complications related to bowel motility Outcome: Progressing   Problem: Pain Managment: Goal: General experience of comfort will improve Outcome: Progressing   Problem: Safety: Goal: Ability to remain free from injury will improve Outcome: Progressing   Problem: Education: Goal: Knowledge of General Education information will improve Description: Including pain rating scale, medication(s)/side effects and non-pharmacologic comfort measures Outcome: Not Progressing   Problem: Health Behavior/Discharge Planning: Goal: Ability to manage health-related needs will improve Outcome: Not Progressing   Problem: Clinical Measurements: Goal: Ability to maintain clinical measurements within normal limits will improve Outcome: Not Progressing Goal: Will remain free from infection Outcome: Not Progressing   Problem: Elimination: Goal: Will not experience complications related to urinary retention Outcome: Not Progressing   Problem: Skin Integrity: Goal: Risk for impaired skin integrity will decrease Outcome: Not Progressing

## 2020-05-30 NOTE — Progress Notes (Signed)
Pharmacy Electrolyte Monitoring Consult:  Pharmacy consulted to assist in monitoring and replacing electrolytes in this 49 y.o. female admitted on 05/15/2020  Labs:  Sodium (mmol/L)  Date Value  05/30/2020 143   Potassium (mmol/L)  Date Value  05/30/2020 3.7   Magnesium (mg/dL)  Date Value  18/84/1660 2.4   Phosphorus (mg/dL)  Date Value  63/10/6008 2.5   Calcium (mg/dL)  Date Value  93/23/5573 7.4 (L)   Albumin (g/dL)  Date Value  22/11/5425 1.5 (L)  05/30/2020 1.5 (L)   Corrected Ca: 9.71 mg/dL  49 year old female with h/o TBI, seizures, hypothyroidism, diabetes. Patient admitted to ICU and subsequently transferred to the floor. Rapid response 7/29 for hypoxia and patient readmitted to the ICU. Blood cultures with peptostreptococcus and proteus. 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  Plan:  No electrolyte replacement warranted today  F/U electrolytes with morning labs  Burnis Medin, PharmD Clinical Pharmacist 05/30/2020 8:27 AM

## 2020-05-31 LAB — CBC WITH DIFFERENTIAL/PLATELET
Abs Immature Granulocytes: 0.04 10*3/uL (ref 0.00–0.07)
Basophils Absolute: 0 10*3/uL (ref 0.0–0.1)
Basophils Relative: 0 %
Eosinophils Absolute: 0.2 10*3/uL (ref 0.0–0.5)
Eosinophils Relative: 4 %
HCT: 23.3 % — ABNORMAL LOW (ref 36.0–46.0)
Hemoglobin: 7.1 g/dL — ABNORMAL LOW (ref 12.0–15.0)
Immature Granulocytes: 1 %
Lymphocytes Relative: 25 %
Lymphs Abs: 1.3 10*3/uL (ref 0.7–4.0)
MCH: 30.3 pg (ref 26.0–34.0)
MCHC: 30.5 g/dL (ref 30.0–36.0)
MCV: 99.6 fL (ref 80.0–100.0)
Monocytes Absolute: 0.9 10*3/uL (ref 0.1–1.0)
Monocytes Relative: 18 %
Neutro Abs: 2.6 10*3/uL (ref 1.7–7.7)
Neutrophils Relative %: 52 %
Platelets: 254 10*3/uL (ref 150–400)
RBC: 2.34 MIL/uL — ABNORMAL LOW (ref 3.87–5.11)
RDW: 19 % — ABNORMAL HIGH (ref 11.5–15.5)
Smear Review: NORMAL
WBC: 4.8 10*3/uL (ref 4.0–10.5)
nRBC: 0 % (ref 0.0–0.2)

## 2020-05-31 LAB — MAGNESIUM: Magnesium: 2.2 mg/dL (ref 1.7–2.4)

## 2020-05-31 LAB — RENAL FUNCTION PANEL
Albumin: 1.5 g/dL — ABNORMAL LOW (ref 3.5–5.0)
Anion gap: 4 — ABNORMAL LOW (ref 5–15)
BUN: <5 mg/dL — ABNORMAL LOW (ref 6–20)
CO2: 29 mmol/L (ref 22–32)
Calcium: 7.1 mg/dL — ABNORMAL LOW (ref 8.9–10.3)
Chloride: 106 mmol/L (ref 98–111)
Creatinine, Ser: 0.41 mg/dL — ABNORMAL LOW (ref 0.44–1.00)
GFR calc Af Amer: >60 mL/min (ref >60)
GFR calc non Af Amer: >60 mL/min (ref >60)
Glucose, Bld: 112 mg/dL — ABNORMAL HIGH (ref 70–99)
Phosphorus: 2.2 mg/dL — ABNORMAL LOW (ref 2.5–4.6)
Potassium: 3.9 mmol/L (ref 3.5–5.1)
Sodium: 139 mmol/L (ref 135–145)

## 2020-05-31 LAB — GLUCOSE, CAPILLARY
Glucose-Capillary: 111 mg/dL — ABNORMAL HIGH (ref 70–99)
Glucose-Capillary: 110 mg/dL — ABNORMAL HIGH (ref 70–99)
Glucose-Capillary: 91 mg/dL (ref 70–99)
Glucose-Capillary: 97 mg/dL (ref 70–99)
Glucose-Capillary: 107 mg/dL — ABNORMAL HIGH (ref 70–99)
Glucose-Capillary: 85 mg/dL (ref 70–99)

## 2020-05-31 MED ORDER — K PHOS MONO-SOD PHOS DI & MONO 155-852-130 MG PO TABS
500.0000 mg | ORAL_TABLET | Freq: Once | ORAL | Status: AC
  Administered 2020-05-31: 500 mg
  Filled 2020-05-31: qty 2

## 2020-05-31 NOTE — Progress Notes (Signed)
Pharmacy Electrolyte Monitoring Consult:  Pharmacy consulted to assist in monitoring and replacing electrolytes in this 49 y.o. female admitted on 05/15/2020  Labs:  Sodium (mmol/L)  Date Value  05/31/2020 139   Potassium (mmol/L)  Date Value  05/31/2020 3.9   Magnesium (mg/dL)  Date Value  59/56/3875 2.2   Phosphorus (mg/dL)  Date Value  64/33/2951 2.2 (L)   Calcium (mg/dL)  Date Value  88/41/6606 7.1 (L)   Albumin (g/dL)  Date Value  30/16/0109 1.5 (L)   Corrected Ca: 9.27 mg/dL  49 year old female with h/o TBI, seizures, hypothyroidism, diabetes. Patient admitted to ICU and subsequently transferred to the floor. Rapid response 7/29 for hypoxia and patient readmitted to the ICU. Blood cultures with peptostreptococcus and proteus. 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  Plan:  500 mg K-Phos neutral per tube x 1  F/U electrolytes with morning labs  Burnis Medin, PharmD Clinical Pharmacist 05/31/2020 1:23 PM

## 2020-05-31 NOTE — Progress Notes (Signed)
Shift Summary: Pt Alert and able to follow commands. Pt able to nod head and mouth words. RT attempted pressure support per order. Pt not able to tolerate. RR increased to the 40s. Pt placed back on PCV. Lungs rhonchorous and pt has tan secretions intermittently from trach. Trach care performed twice during shift by RN and RT. Pt NSR. BP maintained above goal of MAP> 50. Midodrine given per order scheduled. Dubhoff tube remains in place. Vital 1.5 at goal with q4 flushes. Pt had large liquid BM. UOP WNL. Bath given and dressings changed on back and sacrum. Midline catheter site was warm compared to other arm, and site was slightly edematous. IV team assessed. Not concerned about infection and no signs of phlebitis. Will continue to monitor.

## 2020-05-31 NOTE — Progress Notes (Signed)
Midline assessment (requested 1301): mild bruising noted at site consistent with insertion. Brisk blood return noted. Flushes easily. L extremity appears edematous, pt does not exhibit any outward signs of discomfort when arm is repositioned/ palpated or line is flushed. Discussed with RN.

## 2020-05-31 NOTE — Progress Notes (Addendum)
Name: Joanne [name] MRN: [id number] DOB: [birth year]     CONSULTATION DATE: 05/15/2020  Subjective and objective: Remains on the ventilator with poor weaning parameters.  Patient was evaluated by IV team and there is no signs of PICC line infection.  PAST MEDICAL HISTORY :   has a past medical history of Acute kidney injury (AKI) with acute tubular necrosis (ATN) (HCC), Acute on chronic combined systolic and diastolic CHF (congestive heart failure) (HCC) (06/06/2018), Acute on chronic respiratory failure with hypoxia (HCC) (06/06/2018), ARDS (adult respiratory distress syndrome) (HCC) (06/06/2018), Aspiration pneumonia due to gastric secretions (HCC), Diabetes mellitus (HCC), History of traumatic brain injury, Hypothyroidism, Pneumonia due to Klebsiella pneumoniae (HCC) (06/06/2018), Seizure disorder (HCC), Severe sepsis (HCC) (06/06/2018), 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  has a past surgical history that includes Tracheostomy and PEG placement (N/A, 05/27/2020). Prior to Admission medications   Medication Sig Start Date End Date Taking? Authorizing Provider  acetaminophen (TYLENOL) 325 MG tablet Take 2 tablets (650 mg total) by mouth every 6 (six) hours as needed for mild pain or fever (or Fever >/= 101). 11/20/19  Yes Charise Killian, MD  aspirin 81 MG EC tablet aspirin 81 mg tablet,delayed release  Take 1 tablet every day by oral route.   Yes [provider]  B Complex-C-E-Zn (B COMPLEX-C-E-ZINC) tablet Take by mouth.   Yes [provider]  carbamazepine (TEGRETOL XR) 100 MG 12 hr tablet Take 100 mg by mouth 2 (two) times daily.   Yes [provider]  cetirizine (ZYRTEC) 10 MG tablet Take by mouth.   Yes [provider]  cyanocobalamin 50 MCG tablet Take by mouth.   Yes [provider]  famotidine (PEPCID) 20 MG tablet Take 20 mg by mouth 2 (two) times daily.   Yes [provider]  ferrous sulfate  325 (65 FE) MG tablet Take 325 mg by mouth daily.   Yes [provider]  folic acid (FOLVITE) 1 MG tablet Take 1 mg by mouth daily. 07/28/19  Yes [provider]  levothyroxine (SYNTHROID) 100 MCG tablet Take 100 mcg by mouth daily. 07/18/19  Yes [provider]  loratadine (CLARITIN) 10 MG tablet Take 20 mg by mouth daily.   Yes [provider]  midodrine (PROAMATINE) 5 MG tablet Take 5 mg by mouth 3 (three) times daily with meals.  05/06/20  Yes [provider]  MULTIPLE VITAMIN PO Take 1 tablet by mouth daily.   Yes [provider]  Neomy-Bacit-Polymyx-Pramoxine 1 % OINT Apply topically. Apply to (L) Lower buttock/At fold typically every evening shift for wound. Cover with gauze/conversite; Change QD 'til healed.   Yes [provider]  oxyCODONE (OXY IR/ROXICODONE) 5 MG immediate release tablet Take 1 tablet (5 mg total) by mouth every 4 (four) hours as needed for moderate pain. 03/16/20  Yes Sreenath, Sudheer B, MD  polyethylene glycol powder (GLYCOLAX/MIRALAX) 17 GM/SCOOP powder Take 17 g by mouth daily.   Yes [provider]  Polyvinyl Alcohol-Povidone PF 1.4-0.6 % SOLN Place 1 drop into both eyes 3 (three) times daily.   Yes [provider]  SANTYL ointment Apply topically. 05/12/20  Yes [provider]  tamsulosin (FLOMAX) 0.4 MG CAPS capsule Take 0.4 mg by mouth at bedtime.  06/16/19  Yes [provider]  traMADol (ULTRAM) 50 MG tablet Take 25 mg by mouth 3 (three) times daily. 08/05/19  Yes [provider]   Allergies  Allergen Reactions  .  Kaopectate  [Attapulgite] Itching  . Thiopental Itching  . Tomato     FAMILY HISTORY:  Family history is unknown by patient. SOCIAL HISTORY:  reports that she has never smoked. She has never used smokeless tobacco. She reports previous alcohol use. She reports previous drug use.  REVIEW OF SYSTEMS:   Unable to obtain due to critical  illness  Pressure Injury 11/15/19 Coccyx Stage 3 -  Full thickness tissue loss. Subcutaneous fat may be visible but bone, tendon or muscle are NOT exposed. red;bleeding (Active)  11/15/19 1500  Location: Coccyx  Location Orientation:   Staging: Stage 3 -  Full thickness tissue loss. Subcutaneous fat may be visible but bone, tendon or muscle are NOT exposed.  Wound Description (Comments): red;bleeding  Present on Admission: Yes     Pressure Injury 03/02/20 Thigh Posterior;Proximal;Right Stage 2 -  Partial thickness loss of dermis presenting as a shallow open injury with a red, pink wound bed without slough. (Active)  03/02/20 2100  Location: Thigh  Location Orientation: Posterior;Proximal;Right  Staging: Stage 2 -  Partial thickness loss of dermis presenting as a shallow open injury with a red, pink wound bed without slough.  Wound Description (Comments):   Present on Admission: Yes     Pressure Injury 03/02/20 Heel Left Unstageable - Full thickness tissue loss in which the base of the injury is covered by slough (yellow, tan, gray, green or brown) and/or eschar (tan, brown or black) in the wound bed. (Active)  03/02/20 2100  Location: Heel  Location Orientation: Left  Staging: Unstageable - Full thickness tissue loss in which the base of the injury is covered by slough (yellow, tan, gray, green or brown) and/or eschar (tan, brown or black) in the wound bed.  Wound Description (Comments):   Present on Admission: Yes     Estimated body mass index is 31.01 kg/m as calculated from the following:   Height as of this encounter: 5' 4.02" (1.626 m).   Weight as of this encounter: 82 kg.    VITAL SIGNS: Temp:  [99.4 F (37.4 C)-99.9 F (37.7 C)] 99.9 F (37.7 C) (08/09 1921) Pulse Rate:  [68-106] 102 (08/09 2200) BP: (80-112)/(45-79) 85/53 (08/09 2200) SpO2:  [96 %-100 %] 98 % (08/09 2200) FiO2 (%):  [35 %] 35 % (08/09 2001) Weight:  [82 kg] 82 kg (08/09 0500)   I/O last 3  completed shifts: In: 4360.1 [I.V.:230; NG/GT:3530; IV Piggyback:600.1] Out: 1925 [Urine:1925] Total I/O In: 110 [I.V.:10; IV Piggyback:100] Out: 260 [Urine:260]   SpO2: 98 % O2 Flow Rate (L/min): 10 L/min FiO2 (%): 35 %   Physical Examination:  Awake and oriented with no new focal motor deficits Trach in place, on vent, no distress, bilateral equal air entry with bibasilar medium crackles S1 & S2 are audible with no murmur. Benign abdominal exam normal peristalsis. Wasted extremities. Infected sacral decub stage IV and deep ulcer to the left mid back    CULTURE RESULTS   No results found for this or any previous visit (from the past 240 hour(s)).        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 coli bacteremia and was treated appropriately with IV antibiotics. -Management as per ID.  On Unasyn  Hypothyroidism on levothyroxine -Monitor free T4  Severe protein calorie malnutrition Dysphagia -GI was unable to place PEG and recommended interventional radiology for PEG placement.  Anemia.  Hemoglobin dropped 1.3 g within the last 24-hour.  No signs of active obvious bleeding -Monitor H&H and maintain hemoglobin more than 7 g/dL  No signs of PICC line infection  GI prophylaxis.  Hold all chemical DVT prophylaxis because of hemoglobin drop.  Critical care time 35 minutes

## 2020-05-31 NOTE — Progress Notes (Signed)
Midline catheter reassessed at 0900. Noticed that L arm was much warmer than R arm. L arm also seems slightly larger. Attempted to contact IV team to discuss possibility of infection and possibly moving midline site. Called at 1015. Still waiting on response.

## 2020-05-31 NOTE — Plan of Care (Signed)

## 2020-06-01 ENCOUNTER — Inpatient Hospital Stay: Payer: Medicare PPO | Admitting: Pulmonary Disease

## 2020-06-01 ENCOUNTER — Inpatient Hospital Stay: Payer: Medicare PPO

## 2020-06-01 DIAGNOSIS — J9621 Acute and chronic respiratory failure with hypoxia: Secondary | ICD-10-CM | POA: Diagnosis not present

## 2020-06-01 LAB — RENAL FUNCTION PANEL
Albumin: 1.5 g/dL — ABNORMAL LOW (ref 3.5–5.0)
Anion gap: 7 (ref 5–15)
BUN: 10 mg/dL (ref 6–20)
CO2: 28 mmol/L (ref 22–32)
Calcium: 7.3 mg/dL — ABNORMAL LOW (ref 8.9–10.3)
Chloride: 103 mmol/L (ref 98–111)
Creatinine, Ser: 0.47 mg/dL (ref 0.44–1.00)
GFR calc Af Amer: >60 mL/min (ref >60)
GFR calc non Af Amer: >60 mL/min (ref >60)
Glucose, Bld: 104 mg/dL — ABNORMAL HIGH (ref 70–99)
Phosphorus: 2.7 mg/dL (ref 2.5–4.6)
Potassium: 3.9 mmol/L (ref 3.5–5.1)
Sodium: 138 mmol/L (ref 135–145)

## 2020-06-01 LAB — GLUCOSE, CAPILLARY
Glucose-Capillary: 101 mg/dL — ABNORMAL HIGH (ref 70–99)
Glucose-Capillary: 106 mg/dL — ABNORMAL HIGH (ref 70–99)
Glucose-Capillary: 111 mg/dL — ABNORMAL HIGH (ref 70–99)
Glucose-Capillary: 112 mg/dL — ABNORMAL HIGH (ref 70–99)
Glucose-Capillary: 113 mg/dL — ABNORMAL HIGH (ref 70–99)
Glucose-Capillary: 79 mg/dL (ref 70–99)
Glucose-Capillary: 83 mg/dL (ref 70–99)

## 2020-06-01 LAB — CBC WITH DIFFERENTIAL/PLATELET
Abs Immature Granulocytes: 0.02 10*3/uL (ref 0.00–0.07)
Basophils Absolute: 0 10*3/uL (ref 0.0–0.1)
Basophils Relative: 0 %
Eosinophils Absolute: 0.2 10*3/uL (ref 0.0–0.5)
Eosinophils Relative: 5 %
HCT: 23.3 % — ABNORMAL LOW (ref 36.0–46.0)
Hemoglobin: 7.2 g/dL — ABNORMAL LOW (ref 12.0–15.0)
Immature Granulocytes: 0 %
Lymphocytes Relative: 21 %
Lymphs Abs: 1.1 10*3/uL (ref 0.7–4.0)
MCH: 30 pg (ref 26.0–34.0)
MCHC: 30.9 g/dL (ref 30.0–36.0)
MCV: 97.1 fL (ref 80.0–100.0)
Monocytes Absolute: 1.2 10*3/uL — ABNORMAL HIGH (ref 0.1–1.0)
Monocytes Relative: 24 %
Neutro Abs: 2.6 10*3/uL (ref 1.7–7.7)
Neutrophils Relative %: 50 %
Platelets: 269 10*3/uL (ref 150–400)
RBC: 2.4 MIL/uL — ABNORMAL LOW (ref 3.87–5.11)
RDW: 18.7 % — ABNORMAL HIGH (ref 11.5–15.5)
WBC: 5.2 10*3/uL (ref 4.0–10.5)
nRBC: 0 % (ref 0.0–0.2)

## 2020-06-01 LAB — APTT: aPTT: 35 seconds (ref 24–36)

## 2020-06-01 LAB — T4, FREE: Free T4: 1.59 ng/dL — ABNORMAL HIGH (ref 0.61–1.12)

## 2020-06-01 LAB — PROTIME-INR
INR: 1.2 (ref 0.8–1.2)
Prothrombin Time: 14.6 seconds (ref 11.4–15.2)

## 2020-06-01 LAB — MAGNESIUM: Magnesium: 1.8 mg/dL (ref 1.7–2.4)

## 2020-06-01 LAB — PREALBUMIN: Prealbumin: 7.5 mg/dL — ABNORMAL LOW (ref 18–38)

## 2020-06-01 MED ORDER — VITAL 1.5 CAL PO LIQD
1000.0000 mL | ORAL | Status: DC
  Administered 2020-06-01 – 2020-06-09 (×8): 1000 mL

## 2020-06-01 MED ORDER — PROSOURCE TF PO LIQD
45.0000 mL | Freq: Two times a day (BID) | ORAL | Status: DC
  Administered 2020-06-01 – 2020-06-07 (×12): 45 mL
  Filled 2020-06-01 (×13): qty 45

## 2020-06-01 MED ORDER — HEPARIN SODIUM (PORCINE) 5000 UNIT/ML IJ SOLN
5000.0000 [IU] | Freq: Three times a day (TID) | INTRAMUSCULAR | Status: DC
  Administered 2020-06-01 – 2020-06-10 (×27): 5000 [IU] via SUBCUTANEOUS
  Filled 2020-06-01 (×26): qty 1

## 2020-06-01 MED ORDER — LEVOTHYROXINE SODIUM 100 MCG/5ML IV SOLN
40.0000 ug | Freq: Every day | INTRAVENOUS | Status: DC
  Administered 2020-06-02 – 2020-06-03 (×2): 40 ug via INTRAVENOUS
  Filled 2020-06-01 (×2): qty 5

## 2020-06-01 MED ORDER — DEXTROSE 50 % IV SOLN
12.5000 g | INTRAVENOUS | Status: AC
  Filled 2020-06-01: qty 50

## 2020-06-01 NOTE — Progress Notes (Addendum)
Name: Joanne [name] MRN: [id number] DOB: January 18, 1971     CONSULTATION DATE: 05/15/2020 Subjective and objective: Low-grade fever T-max 100.6.  DHT slow to flush however still able to provide tube feed.  PAST MEDICAL HISTORY :   has a past medical history of Acute kidney injury (AKI) with acute tubular necrosis (ATN) (HCC), Acute on chronic combined systolic and diastolic CHF (congestive heart failure) (HCC) (06/06/2018), Acute on chronic respiratory failure with hypoxia (HCC) (06/06/2018), ARDS (adult respiratory distress syndrome) (HCC) (06/06/2018), Aspiration pneumonia due to gastric secretions (HCC), Diabetes mellitus (HCC), History of traumatic brain injury, Hypothyroidism, Pneumonia due to Klebsiella pneumoniae (HCC) (06/06/2018), Seizure disorder (HCC), Severe sepsis (HCC) (06/06/2018), 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  has a past surgical history that includes Tracheostomy and PEG placement (N/A, 05/27/2020). Prior to Admission medications   Medication Sig Start Date End Date Taking? Authorizing Provider  acetaminophen (TYLENOL) 325 MG tablet Take 2 tablets (650 mg total) by mouth every 6 (six) hours as needed for mild pain or fever (or Fever >/= 101). 11/20/19  Yes Charise Killian, MD  aspirin 81 MG EC tablet aspirin 81 mg tablet,delayed release  Take 1 tablet every day by oral route.   Yes [provider]  B Complex-C-E-Zn (B COMPLEX-C-E-ZINC) tablet Take by mouth.   Yes [provider]  carbamazepine (TEGRETOL XR) 100 MG 12 hr tablet Take 100 mg by mouth 2 (two) times daily.   Yes [provider]  cetirizine (ZYRTEC) 10 MG tablet Take by mouth.   Yes [provider]  cyanocobalamin 50 MCG tablet Take by mouth.   Yes [provider]  famotidine (PEPCID) 20 MG tablet Take 20 mg by mouth 2 (two) times daily.   Yes [provider]  ferrous sulfate 325 (65 FE) MG tablet Take 325 mg by mouth daily.    Yes [provider]  folic acid (FOLVITE) 1 MG tablet Take 1 mg by mouth daily. 07/28/19  Yes [provider]  levothyroxine (SYNTHROID) 100 MCG tablet Take 100 mcg by mouth daily. 07/18/19  Yes [provider]  loratadine (CLARITIN) 10 MG tablet Take 20 mg by mouth daily.   Yes [provider]  midodrine (PROAMATINE) 5 MG tablet Take 5 mg by mouth 3 (three) times daily with meals.  05/06/20  Yes [provider]  MULTIPLE VITAMIN PO Take 1 tablet by mouth daily.   Yes [provider]  Neomy-Bacit-Polymyx-Pramoxine 1 % OINT Apply topically. Apply to (L) Lower buttock/At fold typically every evening shift for wound. Cover with gauze/conversite; Change QD 'til healed.   Yes [provider]  oxyCODONE (OXY IR/ROXICODONE) 5 MG immediate release tablet Take 1 tablet (5 mg total) by mouth every 4 (four) hours as needed for moderate pain. 03/16/20  Yes Sreenath, Sudheer B, MD  polyethylene glycol powder (GLYCOLAX/MIRALAX) 17 GM/SCOOP powder Take 17 g by mouth daily.   Yes [provider]  Polyvinyl Alcohol-Povidone PF 1.4-0.6 % SOLN Place 1 drop into both eyes 3 (three) times daily.   Yes [provider]  SANTYL ointment Apply topically. 05/12/20  Yes [provider]  tamsulosin (FLOMAX) 0.4 MG CAPS capsule Take 0.4 mg by mouth at bedtime.  06/16/19  Yes [provider]  traMADol (ULTRAM) 50 MG tablet Take 25 mg by mouth 3 (three) times daily. 08/05/19  Yes [provider]   Allergies  Allergen Reactions  . Kaopectate  [Attapulgite] Itching  . Thiopental Itching  .  Tomato     FAMILY HISTORY:  Family history is unknown by patient. SOCIAL HISTORY:  reports that she has never smoked. She has never used smokeless tobacco. She reports previous alcohol use. She reports previous drug use.  REVIEW OF SYSTEMS:   Unable to obtain due to critical illness  Pressure Injury 11/15/19 Coccyx Stage 3 -  Full  thickness tissue loss. Subcutaneous fat may be visible but bone, tendon or muscle are NOT exposed. red;bleeding (Active)  11/15/19 1500  Location: Coccyx  Location Orientation:   Staging: Stage 3 -  Full thickness tissue loss. Subcutaneous fat may be visible but bone, tendon or muscle are NOT exposed.  Wound Description (Comments): red;bleeding  Present on Admission: Yes     Pressure Injury 03/02/20 Thigh Posterior;Proximal;Right Stage 2 -  Partial thickness loss of dermis presenting as a shallow open injury with a red, pink wound bed without slough. (Active)  03/02/20 2100  Location: Thigh  Location Orientation: Posterior;Proximal;Right  Staging: Stage 2 -  Partial thickness loss of dermis presenting as a shallow open injury with a red, pink wound bed without slough.  Wound Description (Comments):   Present on Admission: Yes     Pressure Injury 03/02/20 Heel Left Unstageable - Full thickness tissue loss in which the base of the injury is covered by slough (yellow, tan, gray, green or brown) and/or eschar (tan, brown or black) in the wound bed. (Active)  03/02/20 2100  Location: Heel  Location Orientation: Left  Staging: Unstageable - Full thickness tissue loss in which the base of the injury is covered by slough (yellow, tan, gray, green or brown) and/or eschar (tan, brown or black) in the wound bed.  Wound Description (Comments):   Present on Admission: Yes     Estimated body mass index is 30.3 kg/m as calculated from the following:   Height as of this encounter: 5' 4.02" (1.626 m).   Weight as of this encounter: 80.1 kg.    VITAL SIGNS: Temp:  [98.9 F (37.2 C)-101.8 F (38.8 C)] 99.3 F (37.4 C) (08/10 2011) Pulse Rate:  [83-106] 105 (08/10 1900) BP: (80-117)/(48-75) 109/68 (08/10 1900) SpO2:  [97 %-100 %] 100 % (08/10 1900) FiO2 (%):  [35 %] 35 % (08/10 1954) Weight:  [80.1 kg] 80.1 kg (08/10 0447)   I/O last 3 completed shifts: In: 4100.1 [I.V.:250; NG/GT:3250; IV  Piggyback:600.1] Out: 2410 [Urine:2410] Total I/O In: 90 [NG/GT:90] Out: 425 [Urine:425]   SpO2: 100 % O2 Flow Rate (L/min): 10 L/min FiO2 (%): 35 %   Physical Examination:  Awake and oriented with no new focal motor deficits Trach in place, on vent, no distress, bilateral equal air entry with bibasilar medium crackles S1 & S2 are audible with no murmur. Benign abdominal exam normal peristalsis. Wasted extremities. Infected sacral decub stage IV and deep ulcer to the left mid back    CULTURE RESULTS   No results found for this or any previous visit (from the past 240 hour(s)).        IMAGING    DG Chest Port 1 View  Result Date: 06/01/2020 CLINICAL DATA:  Pneumonia. EXAM: PORTABLE CHEST 1 VIEW COMPARISON:  One-view chest x-ray 05/28/2020 FINDINGS: Tracheostomy tube is stable and in satisfactory position. Feeding tube terminates at the duodenal bulb or gastric antrum. Low lung volumes exaggerate the heart size. Interstitial and airspace opacities are again seen bilaterally with consolidation in the left upper lobe. Aeration is improving in the left upper lobe. Right lung is stable.  IMPRESSION: 1. Improving aeration in the left upper lobe. 2. Persistent interstitial and airspace disease bilaterally likely representing a combination of edema and infection. Electronically Signed   By: Marin Roberts M.D.   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  improved Bil airspace disease -Continue with Unasyn. 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 coli bacteremia and was treated appropriately with IV antibiotics. -Management as per ID.  On Unasyn  Hypothyroidism on levothyroxine. Free T4 1.59 -Optimize Levothyroxine.  Severe protein calorie malnutrition Dysphagia.  Remains on DHT feeding -GI, interventional radiology and surgery are unable to place PEG.  Anemia.  -Monitor H&H and maintain hemoglobin more than 7 g/dL  No signs of PICC line infection  GI prophylaxis.  Heparin [place] for DVT prophylaxis. HB has been stable.  Critical care time 35 min

## 2020-06-01 NOTE — TOC Progression Note (Addendum)
Transition of Care [facility]) - Progression Note    Patient Details  Name: DORLIS JUDICE MRN: [id number] Date of Birth: 01/10/1971  Transition of Care [facility]) CM/SW Contact  Joseph Art, [place] Phone Number: 06/01/2020, 9:12 AM  Clinical Narrative:     CSW left voicemail for guardian, [name] [phone] to inquire about placement.  11:59 - CSW spoke with [name] [phone], she stated she is willing to reach out to [facility] for possible placement.  This CSW will contact [facility].   Expected Discharge Plan: Skilled Nursing Facility Barriers to Discharge: Continued Medical Work up  Expected Discharge Plan and Services Expected Discharge Plan: Skilled Nursing Facility     Post Acute Care Choice: Skilled Nursing Facility Living arrangements for the past 2 months: Skilled Nursing Facility                                       Social Determinants of Health (SDOH) Interventions    Readmission Risk Interventions Readmission Risk Prevention Plan 03/10/2020 03/03/2020  Transportation Screening Complete Complete  PCP or Specialist Appt within 3-5 Days Complete Complete  HRI or Home Care Consult Complete Complete  Social Work Consult for Recovery Care Planning/Counseling Complete -  Palliative Care Screening Not Applicable Complete  Medication Review Oceanographer) Complete Complete  Some recent data might be hidden
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
## 2020-06-01 NOTE — Progress Notes (Signed)
Pharmacy Electrolyte Monitoring Consult:  Pharmacy consulted to assist in monitoring and replacing electrolytes in this 49 y.o. female admitted on 05/15/2020  Labs:  Sodium (mmol/L)  Date Value  06/01/2020 138   Potassium (mmol/L)  Date Value  06/01/2020 3.9   Magnesium (mg/dL)  Date Value  48/27/0786 1.8   Phosphorus (mg/dL)  Date Value  75/44/9201 2.7   Calcium (mg/dL)  Date Value  00/71/2197 7.3 (L)   Albumin (g/dL)  Date Value  58/83/2549 1.5 (L)   Corrected Ca: 9.45 mg/dL  49 year old female with h/o TBI, seizures, hypothyroidism, diabetes. Patient admitted to ICU and subsequently transferred to the floor. Rapid response 7/29 for hypoxia and patient readmitted to the ICU. Blood cultures with peptostreptococcus and proteus. 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  Plan:  No electrolyte repletion indicated today  F/U electrolytes with morning labs  Tressie Ellis 06/01/2020 8:21 AM

## 2020-06-02 ENCOUNTER — Inpatient Hospital Stay: Payer: Medicare PPO

## 2020-06-02 DIAGNOSIS — J9621 Acute and chronic respiratory failure with hypoxia: Secondary | ICD-10-CM | POA: Diagnosis not present

## 2020-06-02 LAB — RENAL FUNCTION PANEL
Albumin: 1.6 g/dL — ABNORMAL LOW (ref 3.5–5.0)
Anion gap: 6 (ref 5–15)
BUN: 14 mg/dL (ref 6–20)
CO2: 29 mmol/L (ref 22–32)
Calcium: 7.5 mg/dL — ABNORMAL LOW (ref 8.9–10.3)
Chloride: 104 mmol/L (ref 98–111)
Creatinine, Ser: 0.36 mg/dL — ABNORMAL LOW (ref 0.44–1.00)
GFR calc Af Amer: >60 mL/min (ref >60)
GFR calc non Af Amer: >60 mL/min (ref >60)
Glucose, Bld: 89 mg/dL (ref 70–99)
Phosphorus: 2.9 mg/dL (ref 2.5–4.6)
Potassium: 3.7 mmol/L (ref 3.5–5.1)
Sodium: 139 mmol/L (ref 135–145)

## 2020-06-02 LAB — CBC WITH DIFFERENTIAL/PLATELET
Abs Immature Granulocytes: 0.03 10*3/uL (ref 0.00–0.07)
Basophils Absolute: 0 10*3/uL (ref 0.0–0.1)
Basophils Relative: 0 %
Eosinophils Absolute: 0.2 10*3/uL (ref 0.0–0.5)
Eosinophils Relative: 3 %
HCT: 25.3 % — ABNORMAL LOW (ref 36.0–46.0)
Hemoglobin: 7.6 g/dL — ABNORMAL LOW (ref 12.0–15.0)
Immature Granulocytes: 1 %
Lymphocytes Relative: 11 %
Lymphs Abs: 0.7 10*3/uL (ref 0.7–4.0)
MCH: 29.8 pg (ref 26.0–34.0)
MCHC: 30 g/dL (ref 30.0–36.0)
MCV: 99.2 fL (ref 80.0–100.0)
Monocytes Absolute: 1 10*3/uL (ref 0.1–1.0)
Monocytes Relative: 15 %
Neutro Abs: 4.6 10*3/uL (ref 1.7–7.7)
Neutrophils Relative %: 70 %
Platelets: 300 10*3/uL (ref 150–400)
RBC: 2.55 MIL/uL — ABNORMAL LOW (ref 3.87–5.11)
RDW: 18.6 % — ABNORMAL HIGH (ref 11.5–15.5)
Smear Review: NORMAL
WBC: 6.5 10*3/uL (ref 4.0–10.5)
nRBC: 0 % (ref 0.0–0.2)

## 2020-06-02 LAB — GLUCOSE, CAPILLARY
Glucose-Capillary: 110 mg/dL — ABNORMAL HIGH (ref 70–99)
Glucose-Capillary: 117 mg/dL — ABNORMAL HIGH (ref 70–99)
Glucose-Capillary: 120 mg/dL — ABNORMAL HIGH (ref 70–99)
Glucose-Capillary: 121 mg/dL — ABNORMAL HIGH (ref 70–99)
Glucose-Capillary: 69 mg/dL — ABNORMAL LOW (ref 70–99)
Glucose-Capillary: 70 mg/dL (ref 70–99)
Glucose-Capillary: 73 mg/dL (ref 70–99)
Glucose-Capillary: 80 mg/dL (ref 70–99)
Glucose-Capillary: 91 mg/dL (ref 70–99)

## 2020-06-02 LAB — MAGNESIUM: Magnesium: 1.8 mg/dL (ref 1.7–2.4)

## 2020-06-02 MED ORDER — DEXTROSE 50 % IV SOLN
12.5000 g | INTRAVENOUS | Status: DC | PRN
  Administered 2020-06-02: 12.5 g via INTRAVENOUS

## 2020-06-02 MED ORDER — MAGNESIUM SULFATE IN D5W 1-5 GM/100ML-% IV SOLN
1.0000 g | Freq: Once | INTRAVENOUS | Status: AC
  Administered 2020-06-02: 1 g via INTRAVENOUS
  Filled 2020-06-02: qty 100

## 2020-06-02 MED ORDER — POTASSIUM CHLORIDE 20 MEQ PO PACK
40.0000 meq | PACK | Freq: Once | ORAL | Status: AC
  Administered 2020-06-02: 40 meq
  Filled 2020-06-02: qty 2

## 2020-06-02 NOTE — Progress Notes (Signed)
Pharmacy Electrolyte Monitoring Consult:  Pharmacy consulted to assist in monitoring and replacing electrolytes in this 49 y.o. female admitted on 05/15/2020  Labs:  Sodium (mmol/L)  Date Value  06/02/2020 139   Potassium (mmol/L)  Date Value  06/02/2020 3.7   Magnesium (mg/dL)  Date Value  09/47/0962 1.8   Phosphorus (mg/dL)  Date Value  83/66/2947 2.9   Calcium (mg/dL)  Date Value  65/46/5035 7.5 (L)   Albumin (g/dL)  Date Value  46/56/8127 1.6 (L)   Corrected Ca: 9.57 mg/dL  50 year old female with h/o TBI, seizures, hypothyroidism, diabetes. Patient admitted to ICU and subsequently transferred to the floor. Rapid response 7/29 for hypoxia and patient readmitted to the ICU. Blood cultures with peptostreptococcus and proteus. 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   Goal of Therapy:  Potassium 4.0 - 5.1 mmol/L Magnesium 2.0 - 2.4 mg/dL Electrolytes WNL  Plan:  40 mEq KCl per tube x q  1 gram IV magnesium sulfate x 1  F/U electrolytes with morning labs  Lowella Bandy 06/02/2020 6:59 AM

## 2020-06-02 NOTE — Plan of Care (Signed)

## 2020-06-02 NOTE — TOC Progression Note (Signed)
Transition of Care [facility]) - Progression Note    Patient Details  Name: OLIVIYA GILKISON MRN: [id number] Date of Birth: [birth year]  Transition of Care [facility]) CM/SW Contact  Marina Goodell Phone Number: [phone] 06/02/2020, 11:27 AM  Clinical Narrative:     CSW reached out to [facility] for possible placement.  Darl Pikes at [facility] is Administrator.    Expected Discharge Plan: Skilled Nursing Facility Barriers to Discharge: Continued Medical Work up  Expected Discharge Plan and Services Expected Discharge Plan: Skilled Nursing Facility     Post Acute Care Choice: Skilled Nursing Facility Living arrangements for the past 2 months: Skilled Nursing Facility                                       Social Determinants of Health (SDOH) Interventions    Readmission Risk Interventions Readmission Risk Prevention Plan 03/10/2020 03/03/2020  Transportation Screening Complete Complete  PCP or Specialist Appt within 3-5 Days Complete Complete  HRI or Home Care Consult Complete Complete  Social Work Consult for Recovery Care Planning/Counseling Complete -  Palliative Care Screening Not Applicable Complete  Medication Review Oceanographer) Complete Complete  Some recent data might be hidden

## 2020-06-02 NOTE — TOC Progression Note (Signed)
Transition of Care [facility]) - Progression Note    Patient Details  Name: Joanne [name] MRN: [id number] Date of Birth: [birth year]  Transition of Care [facility]) CM/SW Contact  Joseph Art, [place] Phone Number:[phone] 06/02/2020, 2:09 PM  Clinical Narrative:     CSW spoke with [facility], possible bed offer pending insurance authorization.   Expected Discharge Plan: Skilled Nursing Facility Barriers to Discharge: Continued Medical Work up  Expected Discharge Plan and Services Expected Discharge Plan: Skilled Nursing Facility     Post Acute Care Choice: Skilled Nursing Facility Living arrangements for the past 2 months: Skilled Nursing Facility                                       Social Determinants of Health (SDOH) Interventions    Readmission Risk Interventions Readmission Risk Prevention Plan 03/10/2020 03/03/2020  Transportation Screening Complete Complete  PCP or Specialist Appt within 3-5 Days Complete Complete  HRI or Home Care Consult Complete Complete  Social Work Consult for Recovery Care Planning/Counseling Complete -  Palliative Care Screening Not Applicable Complete  Medication Review Oceanographer) Complete Complete  Some recent data might be hidden

## 2020-06-02 NOTE — Progress Notes (Addendum)
Name: Joanne [name] MRN: [id number] DOB: [birth year]     CONSULTATION DATE: 05/15/2020 Subjective and objective: Remains afebrile and no major events last night.  PAST MEDICAL HISTORY :   has a past medical history of Acute kidney injury (AKI) with acute tubular necrosis (ATN) (HCC), Acute on chronic combined systolic and diastolic CHF (congestive heart failure) (HCC) (06/06/2018), Acute on chronic respiratory failure with hypoxia (HCC) (06/06/2018), ARDS (adult respiratory distress syndrome) (HCC) (06/06/2018), Aspiration pneumonia due to gastric secretions (HCC), Diabetes mellitus (HCC), History of traumatic brain injury, Hypothyroidism, Pneumonia due to Klebsiella pneumoniae (HCC) (06/06/2018), Seizure disorder (HCC), Severe sepsis (HCC) (06/06/2018), 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  has a past surgical history that includes Tracheostomy and PEG placement (N/A, 05/27/2020). Prior to Admission medications   Medication Sig Start Date End Date Taking? Authorizing Provider  acetaminophen (TYLENOL) 325 MG tablet Take 2 tablets (650 mg total) by mouth every 6 (six) hours as needed for mild pain or fever (or Fever >/= 101). 11/20/19  Yes Charise Killian, MD  aspirin 81 MG EC tablet aspirin 81 mg tablet,delayed release  Take 1 tablet every day by oral route.   Yes [provider]  B Complex-C-E-Zn (B COMPLEX-C-E-ZINC) tablet Take by mouth.   Yes [provider]  carbamazepine (TEGRETOL XR) 100 MG 12 hr tablet Take 100 mg by mouth 2 (two) times daily.   Yes [provider]  cetirizine (ZYRTEC) 10 MG tablet Take by mouth.   Yes [provider]  cyanocobalamin 50 MCG tablet Take by mouth.   Yes [provider]  famotidine (PEPCID) 20 MG tablet Take 20 mg by mouth 2 (two) times daily.   Yes [provider]  ferrous sulfate 325 (65 FE) MG tablet Take 325 mg by mouth daily.   Yes [provider]  folic  acid (FOLVITE) 1 MG tablet Take 1 mg by mouth daily. 07/28/19  Yes [provider]  levothyroxine (SYNTHROID) 100 MCG tablet Take 100 mcg by mouth daily. 07/18/19  Yes [provider]  loratadine (CLARITIN) 10 MG tablet Take 20 mg by mouth daily.   Yes [provider]  midodrine (PROAMATINE) 5 MG tablet Take 5 mg by mouth 3 (three) times daily with meals.  05/06/20  Yes [provider]  MULTIPLE VITAMIN PO Take 1 tablet by mouth daily.   Yes [provider]  Neomy-Bacit-Polymyx-Pramoxine 1 % OINT Apply topically. Apply to (L) Lower buttock/At fold typically every evening shift for wound. Cover with gauze/conversite; Change QD 'til healed.   Yes [provider]  oxyCODONE (OXY IR/ROXICODONE) 5 MG immediate release tablet Take 1 tablet (5 mg total) by mouth every 4 (four) hours as needed for moderate pain. 03/16/20  Yes Sreenath, Sudheer B, MD  polyethylene glycol powder (GLYCOLAX/MIRALAX) 17 GM/SCOOP powder Take 17 g by mouth daily.   Yes [provider]  Polyvinyl Alcohol-Povidone PF 1.4-0.6 % SOLN Place 1 drop into both eyes 3 (three) times daily.   Yes [provider]  SANTYL ointment Apply topically. 05/12/20  Yes [provider]  tamsulosin (FLOMAX) 0.4 MG CAPS capsule Take 0.4 mg by mouth at bedtime.  06/16/19  Yes [provider]  traMADol (ULTRAM) 50 MG tablet Take 25 mg by mouth 3 (three) times daily. 08/05/19  Yes [provider]   Allergies  Allergen Reactions  . Kaopectate  [Attapulgite] Itching  . Thiopental Itching  . Tomato     FAMILY HISTORY:  Family history is unknown by patient. SOCIAL HISTORY:  reports that she has never smoked. She has never used smokeless tobacco. She reports previous alcohol use. She reports previous drug use.  REVIEW OF SYSTEMS:   Unable to obtain due to critical illness  Pressure Injury 11/15/19 Coccyx Stage 3 -  Full thickness tissue loss. Subcutaneous fat  may be visible but bone, tendon or muscle are NOT exposed. red;bleeding (Active)  11/15/19 1500  Location: Coccyx  Location Orientation:   Staging: Stage 3 -  Full thickness tissue loss. Subcutaneous fat may be visible but bone, tendon or muscle are NOT exposed.  Wound Description (Comments): red;bleeding  Present on Admission: Yes     Pressure Injury 03/02/20 Thigh Posterior;Proximal;Right Stage 2 -  Partial thickness loss of dermis presenting as a shallow open injury with a red, pink wound bed without slough. (Active)  03/02/20 2100  Location: Thigh  Location Orientation: Posterior;Proximal;Right  Staging: Stage 2 -  Partial thickness loss of dermis presenting as a shallow open injury with a red, pink wound bed without slough.  Wound Description (Comments):   Present on Admission: Yes     Pressure Injury 03/02/20 Heel Left Unstageable - Full thickness tissue loss in which the base of the injury is covered by slough (yellow, tan, gray, green or brown) and/or eschar (tan, brown or black) in the wound bed. (Active)  03/02/20 2100  Location: Heel  Location Orientation: Left  Staging: Unstageable - Full thickness tissue loss in which the base of the injury is covered by slough (yellow, tan, gray, green or brown) and/or eschar (tan, brown or black) in the wound bed.  Wound Description (Comments):   Present on Admission: Yes     Estimated body mass index is 30.33 kg/m as calculated from the following:   Height as of this encounter: 5' 4.02" (1.626 m).   Weight as of this encounter: 80.2 kg.    VITAL SIGNS: Temp:  [98.6 F (37 C)-100.6 F (38.1 C)] 99.6 F (37.6 C) (08/11 0800) Pulse Rate:  [83-116] 102 (08/11 0900) BP: (81-134)/(54-76) 103/63 (08/11 0900) SpO2:  [90 %-100 %] 99 % (08/11 0900) FiO2 (%):  [35 %] 35 % (08/11 0740) Weight:  [80.2 kg] 80.2 kg (08/11 0500)   I/O last 3 completed shifts: In: 2444.7 [I.V.:496.7; NG/GT:1448; IV Piggyback:500] Out: 2310  [Urine:2310] Total I/O In: 100 [IV Piggyback:100] Out: 250 [Urine:250]   SpO2: 99 % O2 Flow Rate (L/min): 10 L/min FiO2 (%): 35 %   Physical Examination:  Awake and oriented with no new focal motor deficits Trach in place, on vent, no distress, bilateral equal air entry with bibasilar medium crackles S1 & S2 are audible with no murmur. Benign abdominal exam normal peristalsis. Wasted extremities.Infected sacral decub stage IV and deep ulcer to the left mid back    CULTURE RESULTS   No results found for this or any previous visit (from the past 240 hour(s)).        IMAGING    DG Abd Portable 1V  Result Date: 06/02/2020 CLINICAL DATA:  Check gastric catheter placement EXAM: PORTABLE ABDOMEN - 1 VIEW COMPARISON:  05/20/2020 FINDINGS: Gastric catheter is noted coiled within the stomach. Postoperative changes are seen. Degenerative change of the lumbar spine is noted with stable scoliosis concave to the right. IMPRESSION: Gastric catheter within the stomach. Electronically Signed   By: Alcide Clever M.D.   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  Continues to have poor weaning parameters -Vent weaning as tolerated  Pneumonia. improved Bil airspace disease -Continue with Unasyn. 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 coli bacteremia and was treated appropriately with IV antibiotics. -Management as per ID.On Unasyn  Hypothyroidism on levothyroxine. Free T4 1.59 -Optimize Levothyroxine.  Severe protein calorie malnutrition Dysphagia.    NGT was replaced on 06/02/2020 and position was verified with KUB -GI, interventional radiology and surgery are unable to place PEG.  Anemia. -Monitor H&H and maintain hemoglobin more than 7 g/dL  No signs of PICC line infection  GI prophylaxis.  Heparin [place] for DVT prophylaxis. HB has been stable.  Awaiting LTAC placement  Critical care time 35 minutes

## 2020-06-03 DIAGNOSIS — N179 Acute kidney failure, unspecified: Secondary | ICD-10-CM | POA: Diagnosis not present

## 2020-06-03 DIAGNOSIS — A4159 Other Gram-negative sepsis: Secondary | ICD-10-CM | POA: Diagnosis not present

## 2020-06-03 DIAGNOSIS — R6521 Severe sepsis with septic shock: Secondary | ICD-10-CM | POA: Diagnosis not present

## 2020-06-03 DIAGNOSIS — L89154 Pressure ulcer of sacral region, stage 4: Secondary | ICD-10-CM | POA: Diagnosis not present

## 2020-06-03 DIAGNOSIS — J9621 Acute and chronic respiratory failure with hypoxia: Secondary | ICD-10-CM | POA: Diagnosis not present

## 2020-06-03 LAB — RENAL FUNCTION PANEL
Albumin: 1.5 g/dL — ABNORMAL LOW (ref 3.5–5.0)
Anion gap: 6 (ref 5–15)
BUN: 14 mg/dL (ref 6–20)
CO2: 27 mmol/L (ref 22–32)
Calcium: 7.1 mg/dL — ABNORMAL LOW (ref 8.9–10.3)
Chloride: 106 mmol/L (ref 98–111)
Creatinine, Ser: 0.42 mg/dL — ABNORMAL LOW (ref 0.44–1.00)
GFR calc Af Amer: >60 mL/min (ref >60)
GFR calc non Af Amer: >60 mL/min (ref >60)
Glucose, Bld: 115 mg/dL — ABNORMAL HIGH (ref 70–99)
Phosphorus: 2.3 mg/dL — ABNORMAL LOW (ref 2.5–4.6)
Potassium: 3.9 mmol/L (ref 3.5–5.1)
Sodium: 139 mmol/L (ref 135–145)

## 2020-06-03 LAB — CBC WITH DIFFERENTIAL/PLATELET
Abs Immature Granulocytes: 0.04 10*3/uL (ref 0.00–0.07)
Basophils Absolute: 0 10*3/uL (ref 0.0–0.1)
Basophils Relative: 0 %
Eosinophils Absolute: 0.4 10*3/uL (ref 0.0–0.5)
Eosinophils Relative: 8 %
HCT: 23.5 % — ABNORMAL LOW (ref 36.0–46.0)
Hemoglobin: 7.1 g/dL — ABNORMAL LOW (ref 12.0–15.0)
Immature Granulocytes: 1 %
Lymphocytes Relative: 18 %
Lymphs Abs: 1.1 10*3/uL (ref 0.7–4.0)
MCH: 30 pg (ref 26.0–34.0)
MCHC: 30.2 g/dL (ref 30.0–36.0)
MCV: 99.2 fL (ref 80.0–100.0)
Monocytes Absolute: 1 10*3/uL (ref 0.1–1.0)
Monocytes Relative: 17 %
Neutro Abs: 3.3 10*3/uL (ref 1.7–7.7)
Neutrophils Relative %: 56 %
Platelets: 292 10*3/uL (ref 150–400)
RBC: 2.37 MIL/uL — ABNORMAL LOW (ref 3.87–5.11)
RDW: 18.5 % — ABNORMAL HIGH (ref 11.5–15.5)
Smear Review: NORMAL
WBC: 5.8 10*3/uL (ref 4.0–10.5)
nRBC: 0 % (ref 0.0–0.2)

## 2020-06-03 LAB — GLUCOSE, CAPILLARY
Glucose-Capillary: 101 mg/dL — ABNORMAL HIGH (ref 70–99)
Glucose-Capillary: 102 mg/dL — ABNORMAL HIGH (ref 70–99)
Glucose-Capillary: 124 mg/dL — ABNORMAL HIGH (ref 70–99)
Glucose-Capillary: 103 mg/dL — ABNORMAL HIGH (ref 70–99)
Glucose-Capillary: 107 mg/dL — ABNORMAL HIGH (ref 70–99)

## 2020-06-03 LAB — MAGNESIUM: Magnesium: 2 mg/dL (ref 1.7–2.4)

## 2020-06-03 MED ORDER — PANTOPRAZOLE SODIUM 40 MG PO PACK
40.0000 mg | PACK | Freq: Every day | ORAL | Status: DC
  Administered 2020-06-04 – 2020-06-10 (×7): 40 mg
  Filled 2020-06-03 (×7): qty 20

## 2020-06-03 MED ORDER — K PHOS MONO-SOD PHOS DI & MONO 155-852-130 MG PO TABS
500.0000 mg | ORAL_TABLET | Freq: Once | ORAL | Status: AC
  Administered 2020-06-03: 500 mg
  Filled 2020-06-03: qty 2

## 2020-06-03 MED ORDER — ACETAMINOPHEN 325 MG PO TABS
650.0000 mg | ORAL_TABLET | Freq: Four times a day (QID) | ORAL | Status: DC | PRN
  Administered 2020-06-03 – 2020-06-07 (×8): 650 mg via NASOGASTRIC
  Filled 2020-06-03 (×7): qty 2

## 2020-06-03 MED ORDER — LEVOTHYROXINE SODIUM 88 MCG PO TABS
88.0000 ug | ORAL_TABLET | Freq: Every day | ORAL | Status: DC
  Administered 2020-06-04 – 2020-06-10 (×7): 88 ug via ORAL
  Filled 2020-06-03 (×8): qty 1

## 2020-06-03 NOTE — Progress Notes (Addendum)
Name: Joanne [name] MRN: [id number] DOB: [birth year]     CONSULTATION DATE: 05/15/2020 Subjective and objective: Afebrile no major events last night NGT is  slow to flush however still able to provide tube feeding PAST MEDICAL HISTORY :   has a past medical history of Acute kidney injury (AKI) with acute tubular necrosis (ATN) (HCC), Acute on chronic combined systolic and diastolic CHF (congestive heart failure) (HCC) (06/06/2018), Acute on chronic respiratory failure with hypoxia (HCC) (06/06/2018), ARDS (adult respiratory distress syndrome) (HCC) (06/06/2018), Aspiration pneumonia due to gastric secretions (HCC), Diabetes mellitus (HCC), History of traumatic brain injury, Hypothyroidism, Pneumonia due to Klebsiella pneumoniae (HCC) (06/06/2018), Seizure disorder (HCC), Severe sepsis (HCC) (06/06/2018), 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  has a past surgical history that includes Tracheostomy and PEG placement (N/A, 05/27/2020). Prior to Admission medications   Medication Sig Start Date End Date Taking? Authorizing Provider  acetaminophen (TYLENOL) 325 MG tablet Take 2 tablets (650 mg total) by mouth every 6 (six) hours as needed for mild pain or fever (or Fever >/= 101). 11/20/19  Yes Charise Killian, MD  aspirin 81 MG EC tablet aspirin 81 mg tablet,delayed release  Take 1 tablet every day by oral route.   Yes [provider]  B Complex-C-E-Zn (B COMPLEX-C-E-ZINC) tablet Take by mouth.   Yes [provider]  carbamazepine (TEGRETOL XR) 100 MG 12 hr tablet Take 100 mg by mouth 2 (two) times daily.   Yes [provider]  cetirizine (ZYRTEC) 10 MG tablet Take by mouth.   Yes [provider]  cyanocobalamin 50 MCG tablet Take by mouth.   Yes [provider]  famotidine (PEPCID) 20 MG tablet Take 20 mg by mouth 2 (two) times daily.   Yes [provider]  ferrous sulfate 325 (65 FE) MG tablet Take 325 mg by  mouth daily.   Yes [provider]  folic acid (FOLVITE) 1 MG tablet Take 1 mg by mouth daily. 07/28/19  Yes [provider]  levothyroxine (SYNTHROID) 100 MCG tablet Take 100 mcg by mouth daily. 07/18/19  Yes [provider]  loratadine (CLARITIN) 10 MG tablet Take 20 mg by mouth daily.   Yes [provider]  midodrine (PROAMATINE) 5 MG tablet Take 5 mg by mouth 3 (three) times daily with meals.  05/06/20  Yes [provider]  MULTIPLE VITAMIN PO Take 1 tablet by mouth daily.   Yes [provider]  Neomy-Bacit-Polymyx-Pramoxine 1 % OINT Apply topically. Apply to (L) Lower buttock/At fold typically every evening shift for wound. Cover with gauze/conversite; Change QD 'til healed.   Yes [provider]  oxyCODONE (OXY IR/ROXICODONE) 5 MG immediate release tablet Take 1 tablet (5 mg total) by mouth every 4 (four) hours as needed for moderate pain. 03/16/20  Yes Sreenath, Sudheer B, MD  polyethylene glycol powder (GLYCOLAX/MIRALAX) 17 GM/SCOOP powder Take 17 g by mouth daily.   Yes [provider]  Polyvinyl Alcohol-Povidone PF 1.4-0.6 % SOLN Place 1 drop into both eyes 3 (three) times daily.   Yes [provider]  SANTYL ointment Apply topically. 05/12/20  Yes [provider]  tamsulosin (FLOMAX) 0.4 MG CAPS capsule Take 0.4 mg by mouth at bedtime.  06/16/19  Yes [provider]  traMADol (ULTRAM) 50 MG tablet Take 25 mg by mouth 3 (three) times daily. 08/05/19  Yes [provider]   Allergies  Allergen Reactions  . Kaopectate  [Attapulgite] Itching  . Thiopental  Itching  . Tomato     FAMILY HISTORY:  Family history is unknown by patient. SOCIAL HISTORY:  reports that she has never smoked. She has never used smokeless tobacco. She reports previous alcohol use. She reports previous drug use.  REVIEW OF SYSTEMS:   Unable to obtain due to critical illness  Pressure Injury 11/15/19 Coccyx  Stage 3 -  Full thickness tissue loss. Subcutaneous fat may be visible but bone, tendon or muscle are NOT exposed. red;bleeding (Active)  11/15/19 1500  Location: Coccyx  Location Orientation:   Staging: Stage 3 -  Full thickness tissue loss. Subcutaneous fat may be visible but bone, tendon or muscle are NOT exposed.  Wound Description (Comments): red;bleeding  Present on Admission: Yes     Pressure Injury 03/02/20 Thigh Posterior;Proximal;Right Stage 2 -  Partial thickness loss of dermis presenting as a shallow open injury with a red, pink wound bed without slough. (Active)  03/02/20 2100  Location: Thigh  Location Orientation: Posterior;Proximal;Right  Staging: Stage 2 -  Partial thickness loss of dermis presenting as a shallow open injury with a red, pink wound bed without slough.  Wound Description (Comments):   Present on Admission: Yes     Pressure Injury 03/02/20 Heel Left Unstageable - Full thickness tissue loss in which the base of the injury is covered by slough (yellow, tan, gray, green or brown) and/or eschar (tan, brown or black) in the wound bed. (Active)  03/02/20 2100  Location: Heel  Location Orientation: Left  Staging: Unstageable - Full thickness tissue loss in which the base of the injury is covered by slough (yellow, tan, gray, green or brown) and/or eschar (tan, brown or black) in the wound bed.  Wound Description (Comments):   Present on Admission: Yes     Estimated body mass index is 29.58 kg/m as calculated from the following:   Height as of this encounter: 5' 4.02" (1.626 m).   Weight as of this encounter: 78.2 kg.    VITAL SIGNS: Temp:  [98.6 F (37 C)-100 F (37.8 C)] 98.6 F (37 C) (08/12 0800) Pulse Rate:  [96-111] 102 (08/12 0800) BP: (71-108)/(40-84) 90/40 (08/12 0800) SpO2:  [95 %-100 %] 98 % (08/12 0800) FiO2 (%):  [35 %] 35 % (08/12 0710) Weight:  [78.2 kg] 78.2 kg (08/12 0335)   I/O last 3 completed shifts: In: 3461.6 [I.V.:400;  NG/GT:2361.5; IV Piggyback:700.1] Out: 2675 [Urine:1875; Stool:800] Total I/O In: 3 [I.V.:3] Out: -    SpO2: 98 % O2 Flow Rate (L/min): 10 L/min FiO2 (%): 35 %   Physical Examination:  Awake and oriented with no new focal motor deficits Trach in place, on vent, no distress, bilateral equal air entry with bibasilar medium crackles S1 & S2 are audible with no murmur. Benign abdominal exam normal peristalsis. Wasted extremities.Infected sacral decub stage IV and deep ulcer to the left mid back    CULTURE RESULTS   No results found for this or any previous visit (from the past 240 hour(s)).        IMAGING    No results found.   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  Continues to have poor weaning parameters -Vent weaning as tolerated  Pneumonia. improved Bil airspace disease -Continue with Unasyn. 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 coli bacteremia and was treated appropriately with IV antibiotics. -Management as per ID.On Unasyn  Hypothyroidism on levothyroxine. Free T4 1.59 -Optimize Levothyroxine.  Severe protein calorie malnutrition Dysphagia.  NGT was replaced on 06/02/2020 and position was verified with KUB -GI,interventional radiologyand surgery are unable to place PEG because of abnormal anatomy and severe  contractures.  Anemia. -Monitor H&H and maintain hemoglobin more than 7 g/dL  No signs of PICC line infection  GI prophylaxis.  Heparin [place] for DVT prophylaxis. HB has been stable.  Awaiting LTAC placement  Critical care time 35 minutes

## 2020-06-03 NOTE — Progress Notes (Signed)
Status quo day. Turned ,bath TV. Complained of a headache x 2 today. Also given Morphine for dreassing change. Cousin came to visit. Copious amounts of yellow sputum from trachea.

## 2020-06-03 NOTE — Progress Notes (Signed)
Pharmacy Electrolyte Monitoring Consult:  Pharmacy consulted to assist in monitoring and replacing electrolytes in this 49 y.o. female admitted on 05/15/2020  Labs:  Sodium (mmol/L)  Date Value  06/03/2020 139   Potassium (mmol/L)  Date Value  06/03/2020 3.9   Magnesium (mg/dL)  Date Value  01/74/9449 2.0   Phosphorus (mg/dL)  Date Value  67/59/1638 2.3 (L)   Calcium (mg/dL)  Date Value  46/65/9935 7.1 (L)   Albumin (g/dL)  Date Value  70/17/7939 1.5 (L)   Corrected Ca: 9.31 mg/dL  49 year old female with h/o TBI, seizures, hypothyroidism, diabetes. Patient admitted to ICU and subsequently transferred to the floor. Rapid response 7/29 for hypoxia and patient readmitted to the ICU. Blood cultures with peptostreptococcus and proteus. 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  Goal of Therapy:  Potassium 4.0 - 5.1 mmol/L Magnesium 2.0 - 2.4 mg/dL Electrolytes WNL  Plan:  500 mg K-phos neutral per tube x 1  F/U electrolytes with morning labs  Lowella Bandy 06/03/2020 7:00 AM

## 2020-06-03 NOTE — Progress Notes (Signed)
ID  More alert Follows commands Tracheostomy NG tube  BP (!) 90/40   Pulse (!) 102   Temp 98.6 F (37 C)   Resp (!) 25   Ht 5' 4.02" (1.626 m)   Wt 78.2 kg   LMP  (LMP Unknown)   SpO2 98%   BMI 29.58 kg/m     Chest b/l air entry Sacral decubitus- but need to examine the wound abd- lap scar- ventral hernia   Labs  CBC Latest Ref Rng & Units 06/03/2020 06/02/2020 06/01/2020  WBC 4.0 - 10.5 K/uL 5.8 6.5 5.2  Hemoglobin 12.0 - 15.0 g/dL 7.1(L) 7.6(L) 7.2(L)  Hematocrit 36 - 46 % 23.5(L) 25.3(L) 23.3(L)  Platelets 150 - 400 K/uL 292 300 269    CMP Latest Ref Rng & Units 06/03/2020 06/02/2020 06/01/2020  Glucose 70 - 99 mg/dL 858(I) 89 502(D)  BUN 6 - 20 mg/dL 14 14 10   Creatinine 0.44 - 1.00 mg/dL ) 7.41(O) 8.78(M  Sodium 135 - 145 mmol/L 139 139 138  Potassium 3.5 - 5.1 mmol/L 3.9 3.7 3.9  Chloride 98 - 111 mmol/L 106 104 103  CO2 22 - 32 mmol/L 27 29 28   Calcium 8.9 - 10.3 mg/dL 7.1(L) 7.5(L) 7.3(L)  Total Protein 6.5 - 8.1 g/dL - - -  Total Bilirubin 0.3 - 1.2 mg/dL - - -  Alkaline Phos 38 - 126 U/L - - -  AST 15 - 41 U/L - - -  ALT 0 - 44 U/L - - -   Micro 7/24 Proteus and peptostreptococcus 05/21/20- BC- NG   Impression/recommendation Septic shock due to Proteus bacteremiaand peptostreptococcus resolved Source Stage IV sacral decubituswith osteomyelitis of the underlying bones whichhasgotten worse since last 2 months. Currently on unasyn ( after getting 2 weeks of zosyn) and leucocytosis has resolved Will evaluate the wound tomorrow with the nurses and decide whetehr to switch to PO antibiotic IV antibiotics alone is not cure of  the stage IV decub with osteo. A diverting colostomy,   surgical debridement with wound vac improving nutrition, decreasing  the time she spends on her back will help .  Anemia- has received PRBC    Hypothyroidism on thyroxine.  AKI:  resolved  History of traumatic brain injury- quadriparesis  History of  Streptococcus pyogenes and E. coli bacteremia and was treated appropriately with IV antibiotics.  Discussed the management with her cousin

## 2020-06-04 DIAGNOSIS — R6521 Severe sepsis with septic shock: Secondary | ICD-10-CM | POA: Diagnosis not present

## 2020-06-04 DIAGNOSIS — E860 Dehydration: Secondary | ICD-10-CM

## 2020-06-04 DIAGNOSIS — I110 Hypertensive heart disease with heart failure: Secondary | ICD-10-CM

## 2020-06-04 DIAGNOSIS — L89154 Pressure ulcer of sacral region, stage 4: Secondary | ICD-10-CM | POA: Diagnosis not present

## 2020-06-04 DIAGNOSIS — A4159 Other Gram-negative sepsis: Secondary | ICD-10-CM | POA: Diagnosis not present

## 2020-06-04 DIAGNOSIS — Z8619 Personal history of other infectious and parasitic diseases: Secondary | ICD-10-CM

## 2020-06-04 DIAGNOSIS — E1159 Type 2 diabetes mellitus with other circulatory complications: Secondary | ICD-10-CM

## 2020-06-04 DIAGNOSIS — Z933 Colostomy status: Secondary | ICD-10-CM

## 2020-06-04 DIAGNOSIS — N179 Acute kidney failure, unspecified: Secondary | ICD-10-CM | POA: Diagnosis not present

## 2020-06-04 DIAGNOSIS — J9621 Acute and chronic respiratory failure with hypoxia: Secondary | ICD-10-CM | POA: Diagnosis not present

## 2020-06-04 DIAGNOSIS — D72829 Elevated white blood cell count, unspecified: Secondary | ICD-10-CM

## 2020-06-04 DIAGNOSIS — Z87820 Personal history of traumatic brain injury: Secondary | ICD-10-CM

## 2020-06-04 LAB — GLUCOSE, CAPILLARY
Glucose-Capillary: 102 mg/dL — ABNORMAL HIGH (ref 70–99)
Glucose-Capillary: 108 mg/dL — ABNORMAL HIGH (ref 70–99)
Glucose-Capillary: 111 mg/dL — ABNORMAL HIGH (ref 70–99)
Glucose-Capillary: 78 mg/dL (ref 70–99)
Glucose-Capillary: 86 mg/dL (ref 70–99)
Glucose-Capillary: 94 mg/dL (ref 70–99)
Glucose-Capillary: 99 mg/dL (ref 70–99)

## 2020-06-04 LAB — CBC WITH DIFFERENTIAL/PLATELET
Abs Immature Granulocytes: 0.07 10*3/uL (ref 0.00–0.07)
Basophils Absolute: 0 10*3/uL (ref 0.0–0.1)
Basophils Relative: 0 %
Eosinophils Absolute: 0.4 10*3/uL (ref 0.0–0.5)
Eosinophils Relative: 7 %
HCT: 23.9 % — ABNORMAL LOW (ref 36.0–46.0)
Hemoglobin: 7.6 g/dL — ABNORMAL LOW (ref 12.0–15.0)
Immature Granulocytes: 1 %
Lymphocytes Relative: 20 %
Lymphs Abs: 1.3 10*3/uL (ref 0.7–4.0)
MCH: 30.5 pg (ref 26.0–34.0)
MCHC: 31.8 g/dL (ref 30.0–36.0)
MCV: 96 fL (ref 80.0–100.0)
Monocytes Absolute: 0.9 10*3/uL (ref 0.1–1.0)
Monocytes Relative: 14 %
Neutro Abs: 3.7 10*3/uL (ref 1.7–7.7)
Neutrophils Relative %: 58 %
Platelets: 314 10*3/uL (ref 150–400)
RBC: 2.49 MIL/uL — ABNORMAL LOW (ref 3.87–5.11)
RDW: 18.2 % — ABNORMAL HIGH (ref 11.5–15.5)
WBC: 6.4 10*3/uL (ref 4.0–10.5)
nRBC: 0 % (ref 0.0–0.2)

## 2020-06-04 LAB — RENAL FUNCTION PANEL
Albumin: 1.5 g/dL — ABNORMAL LOW (ref 3.5–5.0)
Anion gap: 8 (ref 5–15)
BUN: 15 mg/dL (ref 6–20)
CO2: 25 mmol/L (ref 22–32)
Calcium: 7.5 mg/dL — ABNORMAL LOW (ref 8.9–10.3)
Chloride: 107 mmol/L (ref 98–111)
Creatinine, Ser: 0.48 mg/dL (ref 0.44–1.00)
GFR calc Af Amer: >60 mL/min (ref >60)
GFR calc non Af Amer: >60 mL/min (ref >60)
Glucose, Bld: 112 mg/dL — ABNORMAL HIGH (ref 70–99)
Phosphorus: 2.9 mg/dL (ref 2.5–4.6)
Potassium: 4 mmol/L (ref 3.5–5.1)
Sodium: 140 mmol/L (ref 135–145)

## 2020-06-04 LAB — MAGNESIUM: Magnesium: 2 mg/dL (ref 1.7–2.4)

## 2020-06-04 MED ORDER — GERHARDT'S BUTT CREAM
TOPICAL_CREAM | Freq: Two times a day (BID) | CUTANEOUS | Status: DC
  Administered 2020-06-06: 1 via TOPICAL
  Filled 2020-06-04 (×3): qty 1

## 2020-06-04 NOTE — Consult Note (Signed)
WOC Nurse wound follow up Chronic nonhealing unstageable pressure wounds remain.  Extending areas of moisture associated skin damage to posterior thighs and buttocks and posterior trunk.  Will implement Gerhardts butt paste twice daily.  Strongly encourage use of dermatherapy linen to bed.  NO DISPOSABLE briefs or underpads.  The therapeutic linen will wick moisture and improve skin microclimate with contact.  Prompt incontinence care.  Will not follow at this time.  Please re-consult if needed.  Maple Hudson MSN, RN, FNP-BC [facility] Wound, Ostomy, Continence Nurse Pager [phone]

## 2020-06-04 NOTE — Progress Notes (Signed)
ID  More alert Follows commands Tracheostomy NG tube  BP 99/72   Pulse (!) 109   Temp 97.8 F (36.6 C) (Axillary)   Resp (!) 25   Ht 5' 4.02" (1.626 m)   Wt 79.2 kg   LMP  (LMP Unknown)   SpO2 99%   BMI 29.96 kg/m     Chest b/l air entry Sacral decubitus-/mid back ulcer examined     Debrided superficial dead tissue and slough 06/16/2020 Post debridement     Labs  CBC Latest Ref Rng & Units 06/16/20 06/03/2020 06/02/2020  WBC 4.0 - 10.5 K/uL 6.4 5.8 6.5  Hemoglobin 12.0 - 15.0 g/dL 7.6(L) 7.1(L) 7.6(L)  Hematocrit 36 - 46 % 23.9(L) 23.5(L) 25.3(L)  Platelets 150 - 400 K/uL 314 292 300    CMP Latest Ref Rng & Units Jun 16, 2020 06/03/2020 06/02/2020  Glucose 70 - 99 mg/dL 401(U) 272(Z) 89  BUN 6 - 20 mg/dL 15 14 14   Creatinine 0.44 - 1.00 mg/dL 3.66) 4.40(H)  Sodium 135 - 145 mmol/L 140 139 139  Potassium 3.5 - 5.1 mmol/L 4.0 3.9 3.7  Chloride 98 - 111 mmol/L 107 106 104  CO2 22 - 32 mmol/L 25 27 29   Calcium 8.9 - 10.3 mg/dL 7.5(L) 7.1(L) 7.5(L)  Total Protein 6.5 - 8.1 g/dL - - -  Total Bilirubin 0.3 - 1.2 mg/dL - - -  Alkaline Phos 38 - 126 U/L - - -  AST 15 - 41 U/L - - -  ALT 0 - 44 U/L - - -   Micro 7/24 Proteus and peptostreptococcus 05/21/20- BC- NG   Impression/recommendation Septic shock due to Proteus bacteremiaand peptostreptococcus resolved Source Stage IV sacral decubituswith osteomyelitis of the underlying bones whichhasgotten worse since last 2 months. Currently on unasyn ( after getting 2 weeks of zosyn) and leucocytosis has resolved  There is a 2nd wound in the left mid back that may need furher debridement by surgery The sacral wound is being contaminated with stool  Antibiotic by itself is not going to help heal the wound- it has treated the septic shock and bacteremia A diverting colostomy,   surgical debridement with wound vac improving nutrition, decreasing  the time she spends on her back will help to heal along with  antibiotic- .  Anemia- has received PRBC    Hypothyroidism on thyroxine.  AKI:  resolved  History of traumatic brain injury- quadriparesis  History of Streptococcus pyogenes and E. coli bacteremia and was treated appropriately with IV antibiotics.  Discussed the management with her nurse

## 2020-06-04 NOTE — Progress Notes (Signed)
Better day. Unable to retain rectal foley. Incontinent of stool x 2. No complaints of pain. Sacral dressing done twice due to fecal contamination.

## 2020-06-04 NOTE — Progress Notes (Signed)
Name: Joanne [name] MRN: [id number] DOB: 08/19/1971     CONSULTATION DATE: 05/15/2020 Subjective and objective: Afebrile, continues to require mechanical ventilation through tracheostomy and has been tolerating tube feed.  Awaiting LTAC placement   PAST MEDICAL HISTORY :   has a past medical history of Acute kidney injury (AKI) with acute tubular necrosis (ATN) (HCC), Acute on chronic combined systolic and diastolic CHF (congestive heart failure) (HCC) (06/06/2018), Acute on chronic respiratory failure with hypoxia (HCC) (06/06/2018), ARDS (adult respiratory distress syndrome) (HCC) (06/06/2018), Aspiration pneumonia due to gastric secretions (HCC), Diabetes mellitus (HCC), History of traumatic brain injury, Hypothyroidism, Pneumonia due to Klebsiella pneumoniae (HCC) (06/06/2018), Seizure disorder (HCC), Severe sepsis (HCC) (06/06/2018), 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  has a past surgical history that includes Tracheostomy and PEG placement (N/A, 05/27/2020). Prior to Admission medications   Medication Sig Start Date End Date Taking? Authorizing Provider  acetaminophen (TYLENOL) 325 MG tablet Take 2 tablets (650 mg total) by mouth every 6 (six) hours as needed for mild pain or fever (or Fever >/= 101). 11/20/19  Yes Charise Killian, MD  aspirin 81 MG EC tablet aspirin 81 mg tablet,delayed release  Take 1 tablet every day by oral route.   Yes [provider]  B Complex-C-E-Zn (B COMPLEX-C-E-ZINC) tablet Take by mouth.   Yes [provider]  carbamazepine (TEGRETOL XR) 100 MG 12 hr tablet Take 100 mg by mouth 2 (two) times daily.   Yes [provider]  cetirizine (ZYRTEC) 10 MG tablet Take by mouth.   Yes [provider]  cyanocobalamin 50 MCG tablet Take by mouth.   Yes [provider]  famotidine (PEPCID) 20 MG tablet Take 20 mg by mouth 2 (two) times daily.   Yes [provider]  ferrous sulfate  325 (65 FE) MG tablet Take 325 mg by mouth daily.   Yes [provider]  folic acid (FOLVITE) 1 MG tablet Take 1 mg by mouth daily. 07/28/19  Yes [provider]  levothyroxine (SYNTHROID) 100 MCG tablet Take 100 mcg by mouth daily. 07/18/19  Yes [provider]  loratadine (CLARITIN) 10 MG tablet Take 20 mg by mouth daily.   Yes [provider]  midodrine (PROAMATINE) 5 MG tablet Take 5 mg by mouth 3 (three) times daily with meals.  05/06/20  Yes [provider]  MULTIPLE VITAMIN PO Take 1 tablet by mouth daily.   Yes [provider]  Neomy-Bacit-Polymyx-Pramoxine 1 % OINT Apply topically. Apply to (L) Lower buttock/At fold typically every evening shift for wound. Cover with gauze/conversite; Change QD 'til healed.   Yes [provider]  oxyCODONE (OXY IR/ROXICODONE) 5 MG immediate release tablet Take 1 tablet (5 mg total) by mouth every 4 (four) hours as needed for moderate pain. 03/16/20  Yes Sreenath, Sudheer B, MD  polyethylene glycol powder (GLYCOLAX/MIRALAX) 17 GM/SCOOP powder Take 17 g by mouth daily.   Yes [provider]  Polyvinyl Alcohol-Povidone PF 1.4-0.6 % SOLN Place 1 drop into both eyes 3 (three) times daily.   Yes [provider]  SANTYL ointment Apply topically. 05/12/20  Yes [provider]  tamsulosin (FLOMAX) 0.4 MG CAPS capsule Take 0.4 mg by mouth at bedtime.  06/16/19  Yes [provider]  traMADol (ULTRAM) 50 MG tablet Take 25 mg by mouth 3 (three) times daily. 08/05/19  Yes [provider]   Allergies  Allergen Reactions  . Kaopectate  [Attapulgite] Itching  .  Thiopental Itching  . Tomato     FAMILY HISTORY:  Family history is unknown by patient. SOCIAL HISTORY:  reports that she has never smoked. She has never used smokeless tobacco. She reports previous alcohol use. She reports previous drug use.  REVIEW OF SYSTEMS:   Unable to obtain due to critical  illness  Pressure Injury 11/15/19 Coccyx Stage 3 -  Full thickness tissue loss. Subcutaneous fat may be visible but bone, tendon or muscle are NOT exposed. red;bleeding (Active)  11/15/19 1500  Location: Coccyx  Location Orientation:   Staging: Stage 3 -  Full thickness tissue loss. Subcutaneous fat may be visible but bone, tendon or muscle are NOT exposed.  Wound Description (Comments): red;bleeding  Present on Admission: Yes     Pressure Injury 03/02/20 Thigh Posterior;Proximal;Right Stage 2 -  Partial thickness loss of dermis presenting as a shallow open injury with a red, pink wound bed without slough. (Active)  03/02/20 2100  Location: Thigh  Location Orientation: Posterior;Proximal;Right  Staging: Stage 2 -  Partial thickness loss of dermis presenting as a shallow open injury with a red, pink wound bed without slough.  Wound Description (Comments):   Present on Admission: Yes     Pressure Injury 03/02/20 Heel Left Unstageable - Full thickness tissue loss in which the base of the injury is covered by slough (yellow, tan, gray, green or brown) and/or eschar (tan, brown or black) in the wound bed. (Active)  03/02/20 2100  Location: Heel  Location Orientation: Left  Staging: Unstageable - Full thickness tissue loss in which the base of the injury is covered by slough (yellow, tan, gray, green or brown) and/or eschar (tan, brown or black) in the wound bed.  Wound Description (Comments):   Present on Admission: Yes     Pressure Injury 05/15/20 Back Posterior;Mid Stage 3 -  Full thickness tissue loss. Subcutaneous fat may be visible but bone, tendon or muscle are NOT exposed. open red area with gray and yellow base (Active)  05/15/20 1000  Location: Back  Location Orientation: Posterior;Mid  Staging: Stage 3 -  Full thickness tissue loss. Subcutaneous fat may be visible but bone, tendon or muscle are NOT exposed.  Wound Description (Comments): open red area with gray and yellow base   Present on Admission: Yes     Estimated body mass index is 29.96 kg/m as calculated from the following:   Height as of this encounter: 5' 4.02" (1.626 m).   Weight as of this encounter: 79.2 kg.    VITAL SIGNS: Temp:  [97.8 F (36.6 C)-99.7 F (37.6 C)] 97.8 F (36.6 C) (08/13 1200) Pulse Rate:  [28-112] 109 (08/13 1000) BP: (84-133)/(52-83) 89/62 (08/13 1700) SpO2:  [85 %-99 %] 99 % (08/13 1430) FiO2 (%):  [30 %-35 %] 30 % (08/13 1430) Weight:  [79.2 kg] 79.2 kg (08/13 0500)   I/O last 3 completed shifts: In: 2996.6 [I.V.:118; Other:45; NG/GT:2333.8; IV Piggyback:499.9] Out: 2750 [Urine:1300; Stool:1450] Total I/O In: 70 [I.V.:10; Other:60] Out: 650 [Urine:650]   SpO2: 99 % O2 Flow Rate (L/min): 10 L/min FiO2 (%): 30 %   Physical Examination:  Awake and oriented with no new focal motor deficits Trach in place, on vent, no distress, bilateral equal air entry with bibasilar medium crackles S1 & S2 are audible with no murmur. Benign abdominal exam normal peristalsis. Wasted extremities.  Infected sacral decub stage IV and deep ulcer to the left mid back    CULTURE RESULTS   No results found for  this or any previous visit (from the past 240 hour(s)).       ASSESSMENT AND PLAN  Acute on chronic respiratory failure trach and vent dependent.Continues to have poor weaning parameters -Vent weaning as tolerated  Pneumonia. improved Bil airspace disease -Continue with Unasyn. 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 coli bacteremia and was treated appropriately with IV antibiotics. -Management as per ID.On Unasyn  Hypothyroidism on levothyroxine. Free T4 1.59 -Optimize Levothyroxine.  Severe protein calorie malnutrition Dysphagia.NGT was replaced on 06/02/2020 and  position was verified with [facility],interventional radiologyand surgery are unable to place PEG because of abnormal anatomy and severe contractures.  Anemia. -Monitor H&H and maintain hemoglobin more than 7 g/dL  No signs of PICC line infection  GI prophylaxis.  Heparin [place] for DVT prophylaxis. HB has been stable.  Awaiting LTAC placement  Critical care time 35 minutes  Assessment and Plan

## 2020-06-04 NOTE — Progress Notes (Signed)
PT Cancellation Note  Patient Details Name: Joanne [name] MRN: [id number] DOB: [birth year]   Cancelled Treatment:    Reason Eval/Treat Not Completed: Other (comment) Upon arrival to room, pt RR at 42 and hypotensive and soundly sleeping. Do not feel she will be able to participate meaningfully in therapy at this time. Will follow closely over the next few days and attempt treatment when more appropriate.  Frederich Chick, SPT   Kamill Fulbright 06/04/2020, 1:25 PM

## 2020-06-04 NOTE — Progress Notes (Signed)
Pharmacy Electrolyte Monitoring Consult:  Pharmacy consulted to assist in monitoring and replacing electrolytes in this 49 y.o. female admitted on 05/15/2020  Labs:  Sodium (mmol/L)  Date Value  06/04/2020 140   Potassium (mmol/L)  Date Value  06/04/2020 4.0   Magnesium (mg/dL)  Date Value  27/78/2423 2.0   Phosphorus (mg/dL)  Date Value  53/61/4431 2.9   Calcium (mg/dL)  Date Value  54/00/8676 7.5 (L)   Albumin (g/dL)  Date Value  19/50/9326 1.5 (L)   Corrected Ca: 9.45 mg/dL  49 year old female with h/o TBI, seizures, hypothyroidism, diabetes. Patient admitted to ICU and subsequently transferred to the floor. Rapid response 7/29 for hypoxia and patient readmitted to the ICU. Blood cultures with peptostreptococcus and proteus. 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 She is receiving tube feedings at 55 mL/hr currently  Goal of Therapy:  Potassium 4.0 - 5.1 mmol/L Magnesium 2.0 - 2.4 mg/dL Electrolytes WNL  Plan:  No electrolyte replacement warranted today  F/U electrolytes with morning labs  Lowella Bandy 06/04/2020 7:09 AM

## 2020-06-05 DIAGNOSIS — J9621 Acute and chronic respiratory failure with hypoxia: Secondary | ICD-10-CM | POA: Diagnosis not present

## 2020-06-05 LAB — RENAL FUNCTION PANEL
Albumin: 1.5 g/dL — ABNORMAL LOW (ref 3.5–5.0)
Anion gap: 6 (ref 5–15)
BUN: 18 mg/dL (ref 6–20)
CO2: 27 mmol/L (ref 22–32)
Calcium: 7.6 mg/dL — ABNORMAL LOW (ref 8.9–10.3)
Chloride: 106 mmol/L (ref 98–111)
Creatinine, Ser: 0.41 mg/dL — ABNORMAL LOW (ref 0.44–1.00)
GFR calc Af Amer: >60 mL/min (ref >60)
GFR calc non Af Amer: >60 mL/min (ref >60)
Glucose, Bld: 97 mg/dL (ref 70–99)
Phosphorus: 3 mg/dL (ref 2.5–4.6)
Potassium: 3.8 mmol/L (ref 3.5–5.1)
Sodium: 139 mmol/L (ref 135–145)

## 2020-06-05 LAB — CBC WITH DIFFERENTIAL/PLATELET
Abs Immature Granulocytes: 0.08 10*3/uL — ABNORMAL HIGH (ref 0.00–0.07)
Basophils Absolute: 0 10*3/uL (ref 0.0–0.1)
Basophils Relative: 0 %
Eosinophils Absolute: 0.5 10*3/uL (ref 0.0–0.5)
Eosinophils Relative: 8 %
HCT: 23.9 % — ABNORMAL LOW (ref 36.0–46.0)
Hemoglobin: 7.3 g/dL — ABNORMAL LOW (ref 12.0–15.0)
Immature Granulocytes: 1 %
Lymphocytes Relative: 21 %
Lymphs Abs: 1.3 10*3/uL (ref 0.7–4.0)
MCH: 30.4 pg (ref 26.0–34.0)
MCHC: 30.5 g/dL (ref 30.0–36.0)
MCV: 99.6 fL (ref 80.0–100.0)
Monocytes Absolute: 1 10*3/uL (ref 0.1–1.0)
Monocytes Relative: 16 %
Neutro Abs: 3.4 10*3/uL (ref 1.7–7.7)
Neutrophils Relative %: 54 %
Platelets: 291 10*3/uL (ref 150–400)
RBC: 2.4 MIL/uL — ABNORMAL LOW (ref 3.87–5.11)
RDW: 18.1 % — ABNORMAL HIGH (ref 11.5–15.5)
WBC: 6.3 10*3/uL (ref 4.0–10.5)
nRBC: 0 % (ref 0.0–0.2)

## 2020-06-05 LAB — GLUCOSE, CAPILLARY
Glucose-Capillary: 107 mg/dL — ABNORMAL HIGH (ref 70–99)
Glucose-Capillary: 112 mg/dL — ABNORMAL HIGH (ref 70–99)
Glucose-Capillary: 116 mg/dL — ABNORMAL HIGH (ref 70–99)
Glucose-Capillary: 88 mg/dL (ref 70–99)
Glucose-Capillary: 91 mg/dL (ref 70–99)
Glucose-Capillary: 98 mg/dL (ref 70–99)

## 2020-06-05 LAB — MAGNESIUM: Magnesium: 2 mg/dL (ref 1.7–2.4)

## 2020-06-05 MED ORDER — ALBUMIN HUMAN 25 % IV SOLN
50.0000 g | Freq: Once | INTRAVENOUS | Status: AC
  Administered 2020-06-05: 50 g via INTRAVENOUS
  Filled 2020-06-05: qty 100

## 2020-06-05 NOTE — Plan of Care (Addendum)
Patient continues to be trached and on the ventilator. FIO2 was weaned down to 24% this shift and patient is tolerating well. Patient is alert, does nod and gesture appropriately. Tube feeds continue to infuse without difficulty. Patient has been turned and oral care performed every 2 hours. Patient has been having loose BM's, rectal tube has been ordered but patient has very little rectal tone and unable to hold the donut in place so patient continues to be incontinent of stool X's 2 this shift. Dressing changes performed per order and as needed. Patient has been kept safe and free from falls or injuries.

## 2020-06-05 NOTE — Progress Notes (Signed)
Name: Joanne [name] MRN: [id number] DOB: 1970-12-25     CONSULTATION DATE: 05/15/2020  Subjective and objective: Afebrile, continues to require mechanical ventilation through tracheostomy and no major events last night.  Tolerating tube feed.  PAST MEDICAL HISTORY :   has a past medical history of Acute kidney injury (AKI) with acute tubular necrosis (ATN) (HCC), Acute on chronic combined systolic and diastolic CHF (congestive heart failure) (HCC) (06/06/2018), Acute on chronic respiratory failure with hypoxia (HCC) (06/06/2018), ARDS (adult respiratory distress syndrome) (HCC) (06/06/2018), Aspiration pneumonia due to gastric secretions (HCC), Diabetes mellitus (HCC), History of traumatic brain injury, Hypothyroidism, Pneumonia due to Klebsiella pneumoniae (HCC) (06/06/2018), Seizure disorder (HCC), Severe sepsis (HCC) (06/06/2018), 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  has a past surgical history that includes Tracheostomy and PEG placement (N/A, 05/27/2020). Prior to Admission medications   Medication Sig Start Date End Date Taking? Authorizing Provider  acetaminophen (TYLENOL) 325 MG tablet Take 2 tablets (650 mg total) by mouth every 6 (six) hours as needed for mild pain or fever (or Fever >/= 101). 11/20/19  Yes Charise Killian, MD  aspirin 81 MG EC tablet aspirin 81 mg tablet,delayed release  Take 1 tablet every day by oral route.   Yes [provider]  B Complex-C-E-Zn (B COMPLEX-C-E-ZINC) tablet Take by mouth.   Yes [provider]  carbamazepine (TEGRETOL XR) 100 MG 12 hr tablet Take 100 mg by mouth 2 (two) times daily.   Yes [provider]  cetirizine (ZYRTEC) 10 MG tablet Take by mouth.   Yes [provider]  cyanocobalamin 50 MCG tablet Take by mouth.   Yes [provider]  famotidine (PEPCID) 20 MG tablet Take 20 mg by mouth 2 (two) times daily.   Yes [provider]  ferrous sulfate 325 (65  FE) MG tablet Take 325 mg by mouth daily.   Yes [provider]  folic acid (FOLVITE) 1 MG tablet Take 1 mg by mouth daily. 07/28/19  Yes [provider]  levothyroxine (SYNTHROID) 100 MCG tablet Take 100 mcg by mouth daily. 07/18/19  Yes [provider]  loratadine (CLARITIN) 10 MG tablet Take 20 mg by mouth daily.   Yes [provider]  midodrine (PROAMATINE) 5 MG tablet Take 5 mg by mouth 3 (three) times daily with meals.  05/06/20  Yes [provider]  MULTIPLE VITAMIN PO Take 1 tablet by mouth daily.   Yes [provider]  Neomy-Bacit-Polymyx-Pramoxine 1 % OINT Apply topically. Apply to (L) Lower buttock/At fold typically every evening shift for wound. Cover with gauze/conversite; Change QD 'til healed.   Yes [provider]  oxyCODONE (OXY IR/ROXICODONE) 5 MG immediate release tablet Take 1 tablet (5 mg total) by mouth every 4 (four) hours as needed for moderate pain. 03/16/20  Yes Sreenath, Sudheer B, MD  polyethylene glycol powder (GLYCOLAX/MIRALAX) 17 GM/SCOOP powder Take 17 g by mouth daily.   Yes [provider]  Polyvinyl Alcohol-Povidone PF 1.4-0.6 % SOLN Place 1 drop into both eyes 3 (three) times daily.   Yes [provider]  SANTYL ointment Apply topically. 05/12/20  Yes [provider]  tamsulosin (FLOMAX) 0.4 MG CAPS capsule Take 0.4 mg by mouth at bedtime.  06/16/19  Yes [provider]  traMADol (ULTRAM) 50 MG tablet Take 25 mg by mouth 3 (three) times daily. 08/05/19  Yes [provider]   Allergies  Allergen Reactions  . Kaopectate  [Attapulgite] Itching  .  Thiopental Itching  . Tomato     FAMILY HISTORY:  Family history is unknown by patient. SOCIAL HISTORY:  reports that she has never smoked. She has never used smokeless tobacco. She reports previous alcohol use. She reports previous drug use.  REVIEW OF SYSTEMS:   Unable to obtain due to critical  illness  Pressure Injury 11/15/19 Coccyx Stage 3 -  Full thickness tissue loss. Subcutaneous fat may be visible but bone, tendon or muscle are NOT exposed. red;bleeding (Active)  11/15/19 1500  Location: Coccyx  Location Orientation:   Staging: Stage 3 -  Full thickness tissue loss. Subcutaneous fat may be visible but bone, tendon or muscle are NOT exposed.  Wound Description (Comments): red;bleeding  Present on Admission: Yes     Pressure Injury 03/02/20 Thigh Posterior;Proximal;Right Stage 2 -  Partial thickness loss of dermis presenting as a shallow open injury with a red, pink wound bed without slough. (Active)  03/02/20 2100  Location: Thigh  Location Orientation: Posterior;Proximal;Right  Staging: Stage 2 -  Partial thickness loss of dermis presenting as a shallow open injury with a red, pink wound bed without slough.  Wound Description (Comments):   Present on Admission: Yes     Pressure Injury 03/02/20 Heel Left Unstageable - Full thickness tissue loss in which the base of the injury is covered by slough (yellow, tan, gray, green or brown) and/or eschar (tan, brown or black) in the wound bed. (Active)  03/02/20 2100  Location: Heel  Location Orientation: Left  Staging: Unstageable - Full thickness tissue loss in which the base of the injury is covered by slough (yellow, tan, gray, green or brown) and/or eschar (tan, brown or black) in the wound bed.  Wound Description (Comments):   Present on Admission: Yes     Pressure Injury 05/15/20 Back Posterior;Mid Stage 3 -  Full thickness tissue loss. Subcutaneous fat may be visible but bone, tendon or muscle are NOT exposed. open red area with gray and yellow base (Active)  05/15/20 1000  Location: Back  Location Orientation: Posterior;Mid  Staging: Stage 3 -  Full thickness tissue loss. Subcutaneous fat may be visible but bone, tendon or muscle are NOT exposed.  Wound Description (Comments): open red area with gray and yellow base   Present on Admission: Yes     Estimated body mass index is 29.2 kg/m as calculated from the following:   Height as of this encounter: 5' 4.02" (1.626 m).   Weight as of this encounter: 77.2 kg.    VITAL SIGNS: Temp:  [99 F (37.2 C)-100.7 F (38.2 C)] 100 F (37.8 C) (08/14 1700) Pulse Rate:  [96-123] 110 (08/14 1700) BP: (91-138)/(51-90) 105/58 (08/14 1600) SpO2:  [91 %-100 %] 94 % (08/14 1700) FiO2 (%):  [24 %-28 %] 24 % (08/14 1503) Weight:  [77.2 kg] 77.2 kg (08/14 0333)   I/O last 3 completed shifts: In: 2968.2 [I.V.:334.8; Other:60; NG/GT:1973.6; IV Piggyback:599.9] Out: 2000 [Urine:1900; Stool:100] Total I/O In: 1490 [I.V.:70; NG/GT:1220; IV Piggyback:200] Out: 1125 [Urine:1125]   SpO2: 94 % O2 Flow Rate (L/min): 10 L/min FiO2 (%): 24 %   Physical Examination:  Awake and oriented with no new focal motor deficits Trach in place, on vent, no distress, bilateral equal air entry with bibasilar medium crackles S1 & S2 are audible with no murmur. Benign abdominal exam normal peristalsis. Wasted extremities.  Infected sacral decub stage IV and deep ulcer to the left mid back   ASSESSMENT AND PLAN  Acute on chronic  respiratory failure trach and vent dependent.Continues to have poor weaning parameters. -Trach and vent weaning as tolerated  Pneumonia. improved Bil airspace disease -Continue with Unasyn. 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 coli bacteremia and was treated appropriately with IV antibiotics. -Management as per ID.On Unasyn  Hypothyroidism on levothyroxine. Free T4 1.59 -Optimize Levothyroxine.  Severe protein calorie malnutrition.  Monitor prealbumin. Dysphagia.Tolerating tube feed. NGT was replaced on 06/02/2020 and position was verified with  [facility],interventional radiologyand surgery are unable to place PEGbecause of abnormal anatomy and severe contractures.  Anemia. -Monitor H&H and maintain hemoglobin more than 7 g/dL  No signs of PICC line infection  GI prophylaxis.  On Protonix  Heparin [place] for DVT prophylaxis. HB has been stable.  Awaiting LTAC placement  Critical care time 35 minutes

## 2020-06-05 NOTE — Progress Notes (Signed)
Pharmacy Electrolyte Monitoring Consult:  Pharmacy consulted to assist in monitoring and replacing electrolytes in this 49 y.o. female admitted on 05/15/2020  Labs:  Sodium (mmol/L)  Date Value  06/05/2020 139   Potassium (mmol/L)  Date Value  06/05/2020 3.8   Magnesium (mg/dL)  Date Value  13/64/3837 2.0   Phosphorus (mg/dL)  Date Value  79/39/6886 3.0   Calcium (mg/dL)  Date Value  48/47/2072 7.6 (L)   Albumin (g/dL)  Date Value  18/28/8337 1.5 (L)   Corrected Ca: 9.83 mg/dL  49 year old female with h/o TBI, seizures, hypothyroidism, diabetes. Patient admitted to ICU and subsequently transferred to the floor. Rapid response 7/29 for hypoxia and patient readmitted to the ICU. Blood cultures with peptostreptococcus and proteus. 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 She is receiving tube feedings at 55 mL/hr currently  Goal of Therapy:  Potassium 4.0 - 5.1 mmol/L Magnesium 2.0 - 2.4 mg/dL Electrolytes WNL  Plan:  No electrolyte replacement warranted today  F/U electrolytes with morning labs  Pricilla Riffle, PharmD 06/05/2020 7:36 AM

## 2020-06-06 DIAGNOSIS — J9621 Acute and chronic respiratory failure with hypoxia: Secondary | ICD-10-CM | POA: Diagnosis not present

## 2020-06-06 LAB — LACTIC ACID, PLASMA: Lactic Acid, Venous: 1.3 mmol/L (ref 0.5–1.9)

## 2020-06-06 LAB — GLUCOSE, CAPILLARY
Glucose-Capillary: 101 mg/dL — ABNORMAL HIGH (ref 70–99)
Glucose-Capillary: 106 mg/dL — ABNORMAL HIGH (ref 70–99)
Glucose-Capillary: 108 mg/dL — ABNORMAL HIGH (ref 70–99)
Glucose-Capillary: 116 mg/dL — ABNORMAL HIGH (ref 70–99)
Glucose-Capillary: 86 mg/dL (ref 70–99)
Glucose-Capillary: 93 mg/dL (ref 70–99)

## 2020-06-06 LAB — CBC
HCT: 21.9 % — ABNORMAL LOW (ref 36.0–46.0)
Hemoglobin: 7 g/dL — ABNORMAL LOW (ref 12.0–15.0)
MCH: 30.7 pg (ref 26.0–34.0)
MCHC: 32 g/dL (ref 30.0–36.0)
MCV: 96.1 fL (ref 80.0–100.0)
Platelets: 248 10*3/uL (ref 150–400)
RBC: 2.28 MIL/uL — ABNORMAL LOW (ref 3.87–5.11)
RDW: 18.4 % — ABNORMAL HIGH (ref 11.5–15.5)
WBC: 7.2 10*3/uL (ref 4.0–10.5)
nRBC: 0 % (ref 0.0–0.2)

## 2020-06-06 LAB — PREALBUMIN: Prealbumin: 9.4 mg/dL — ABNORMAL LOW (ref 18–38)

## 2020-06-06 NOTE — Progress Notes (Signed)
Name: Joanne [name] MRN: [id number] DOB: 03-13-1971     CONSULTATION DATE: 05/15/2020 Subjective and objectives: Febrile T-max 101.  Remains on a ventilator.  PAST MEDICAL HISTORY :   has a past medical history of Acute kidney injury (AKI) with acute tubular necrosis (ATN) (HCC), Acute on chronic combined systolic and diastolic CHF (congestive heart failure) (HCC) (06/06/2018), Acute on chronic respiratory failure with hypoxia (HCC) (06/06/2018), ARDS (adult respiratory distress syndrome) (HCC) (06/06/2018), Aspiration pneumonia due to gastric secretions (HCC), Diabetes mellitus (HCC), History of traumatic brain injury, Hypothyroidism, Pneumonia due to Klebsiella pneumoniae (HCC) (06/06/2018), Seizure disorder (HCC), Severe sepsis (HCC) (06/06/2018), 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  has a past surgical history that includes Tracheostomy and PEG placement (N/A, 05/27/2020). Prior to Admission medications   Medication Sig Start Date End Date Taking? Authorizing Provider  acetaminophen (TYLENOL) 325 MG tablet Take 2 tablets (650 mg total) by mouth every 6 (six) hours as needed for mild pain or fever (or Fever >/= 101). 11/20/19  Yes Charise Killian, MD  aspirin 81 MG EC tablet aspirin 81 mg tablet,delayed release  Take 1 tablet every day by oral route.   Yes [provider]  B Complex-C-E-Zn (B COMPLEX-C-E-ZINC) tablet Take by mouth.   Yes [provider]  carbamazepine (TEGRETOL XR) 100 MG 12 hr tablet Take 100 mg by mouth 2 (two) times daily.   Yes [provider]  cetirizine (ZYRTEC) 10 MG tablet Take by mouth.   Yes [provider]  cyanocobalamin 50 MCG tablet Take by mouth.   Yes [provider]  famotidine (PEPCID) 20 MG tablet Take 20 mg by mouth 2 (two) times daily.   Yes [provider]  ferrous sulfate 325 (65 FE) MG tablet Take 325 mg by mouth daily.   Yes [provider]  folic acid  (FOLVITE) 1 MG tablet Take 1 mg by mouth daily. 07/28/19  Yes [provider]  levothyroxine (SYNTHROID) 100 MCG tablet Take 100 mcg by mouth daily. 07/18/19  Yes [provider]  loratadine (CLARITIN) 10 MG tablet Take 20 mg by mouth daily.   Yes [provider]  midodrine (PROAMATINE) 5 MG tablet Take 5 mg by mouth 3 (three) times daily with meals.  05/06/20  Yes [provider]  MULTIPLE VITAMIN PO Take 1 tablet by mouth daily.   Yes [provider]  Neomy-Bacit-Polymyx-Pramoxine 1 % OINT Apply topically. Apply to (L) Lower buttock/At fold typically every evening shift for wound. Cover with gauze/conversite; Change QD 'til healed.   Yes [provider]  oxyCODONE (OXY IR/ROXICODONE) 5 MG immediate release tablet Take 1 tablet (5 mg total) by mouth every 4 (four) hours as needed for moderate pain. 03/16/20  Yes Sreenath, Sudheer B, MD  polyethylene glycol powder (GLYCOLAX/MIRALAX) 17 GM/SCOOP powder Take 17 g by mouth daily.   Yes [provider]  Polyvinyl Alcohol-Povidone PF 1.4-0.6 % SOLN Place 1 drop into both eyes 3 (three) times daily.   Yes [provider]  SANTYL ointment Apply topically. 05/12/20  Yes [provider]  tamsulosin (FLOMAX) 0.4 MG CAPS capsule Take 0.4 mg by mouth at bedtime.  06/16/19  Yes [provider]  traMADol (ULTRAM) 50 MG tablet Take 25 mg by mouth 3 (three) times daily. 08/05/19  Yes [provider]   Allergies  Allergen Reactions  . Kaopectate  [Attapulgite] Itching  . Thiopental Itching  . Tomato     FAMILY HISTORY:  Family history is unknown by patient. SOCIAL HISTORY:  reports that she has never smoked. She has never used smokeless tobacco. She reports previous alcohol use. She reports previous drug use.  REVIEW OF SYSTEMS:   Unable to obtain due to critical illness  Pressure Injury 11/15/19 Coccyx Stage 3 -  Full thickness tissue loss. Subcutaneous fat may  be visible but bone, tendon or muscle are NOT exposed. red;bleeding (Active)  11/15/19 1500  Location: Coccyx  Location Orientation:   Staging: Stage 3 -  Full thickness tissue loss. Subcutaneous fat may be visible but bone, tendon or muscle are NOT exposed.  Wound Description (Comments): red;bleeding  Present on Admission: Yes     Pressure Injury 03/02/20 Thigh Posterior;Proximal;Right Stage 2 -  Partial thickness loss of dermis presenting as a shallow open injury with a red, pink wound bed without slough. (Active)  03/02/20 2100  Location: Thigh  Location Orientation: Posterior;Proximal;Right  Staging: Stage 2 -  Partial thickness loss of dermis presenting as a shallow open injury with a red, pink wound bed without slough.  Wound Description (Comments):   Present on Admission: Yes     Pressure Injury 03/02/20 Heel Left Unstageable - Full thickness tissue loss in which the base of the injury is covered by slough (yellow, tan, gray, green or brown) and/or eschar (tan, brown or black) in the wound bed. (Active)  03/02/20 2100  Location: Heel  Location Orientation: Left  Staging: Unstageable - Full thickness tissue loss in which the base of the injury is covered by slough (yellow, tan, gray, green or brown) and/or eschar (tan, brown or black) in the wound bed.  Wound Description (Comments):   Present on Admission: Yes     Pressure Injury 05/15/20 Back Posterior;Mid Stage 3 -  Full thickness tissue loss. Subcutaneous fat may be visible but bone, tendon or muscle are NOT exposed. open red area with gray and yellow base (Active)  05/15/20 1000  Location: Back  Location Orientation: Posterior;Mid  Staging: Stage 3 -  Full thickness tissue loss. Subcutaneous fat may be visible but bone, tendon or muscle are NOT exposed.  Wound Description (Comments): open red area with gray and yellow base  Present on Admission: Yes     Estimated body mass index is 30.26 kg/m as calculated from the  following:   Height as of this encounter: 5' 4.02" (1.626 m).   Weight as of this encounter: 80 kg.    VITAL SIGNS: Temp:  [97.8 F (36.6 C)-101 F (38.3 C)] 100.6 F (38.1 C) (08/15 0645) Pulse Rate:  [95-117] 95 (08/15 0900) BP: (75-116)/(49-80) 82/56 (08/15 0900) SpO2:  [89 %-100 %] 96 % (08/15 0900) FiO2 (%):  [21 %-24 %] 21 % (08/15 1247) Weight:  [80 kg] 80 kg (08/15 0500)   I/O last 3 completed shifts: In: 3755.2 [I.V.:346.7; NG/GT:2746.3; IV Piggyback:662.2] Out: 2475 [Urine:2475] No intake/output data recorded.   SpO2: 96 % O2 Flow Rate (L/min): 10 L/min FiO2 (%): 21 %   Physical Examination:  Awake and oriented with no new focal motor deficits Trach in place, on vent, no distress, bilateral equal air entry with bibasilar medium crackles S1 & S2 are audible with no murmur. Benign abdominal exam normal peristalsis. Wasted extremities.Infected sacral decub stage IV and deep ulcer to the left mid back   CULTURE RESULTS   No results found for this or any previous visit (from the past 240 hour(s)).        IMAGING    No  results found.   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 -Trach and vent weaning as tolerated  Pneumonia. improved Bil airspace disease -Continue with Unasyn. 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 -Follow with repeat BLD culture and managment as per  ID.  Septic shock due to Proteus bacteremiaand peptostreptococcusresolved Recent history of Streptococcus pyogenes and E. coli bacteremia and was treated appropriately with IV antibiotics. -Management as per ID.On Unasyn  Hypothyroidism on levothyroxine. Free T4 1.59 -Optimize Levothyroxine.  Severe protein calorie malnutrition.  Monitor prealbumin. Dysphagia.Tolerating tube feed. NGT was replaced on 06/02/2020 and position was verified with [facility],interventional radiologyand surgery are unable to place PEGbecause of abnormal anatomy and severe contractures.  Anemia. -Monitor H&H and maintain hemoglobin more than 7 g/dL  No signs of PICC line infection  GI prophylaxis.  On Protonix  Heparin Emer[facility] for DVT prophylaxis. HB has been stable.  Awaiting LTAC placement  Critical care time 35 minutes

## 2020-06-06 NOTE — Progress Notes (Addendum)
Remains in ST. And on ventilator. Weaned to 21% and rate of 10. Unable to wean further. BM x 3 with 2 stools contaminating  sacral wound.  Slept off and on. No visitors or calls concerning patient.

## 2020-06-06 NOTE — Progress Notes (Signed)
Pharmacy Electrolyte Monitoring Consult:  Pharmacy consulted to assist in monitoring and replacing electrolytes in this 49 y.o. female admitted on 05/15/2020  Labs:  Sodium (mmol/L)  Date Value  06/05/2020 139   Potassium (mmol/L)  Date Value  06/05/2020 3.8   Magnesium (mg/dL)  Date Value  71/21/9758 2.0   Phosphorus (mg/dL)  Date Value  83/25/4982 3.0   Calcium (mg/dL)  Date Value  64/15/8309 7.6 (L)   Albumin (g/dL)  Date Value  40/76/8088 1.5 (L)   Corrected Ca: 9.6 mg/dL  49 year old female with h/o TBI, seizures, hypothyroidism, diabetes. Patient admitted to ICU and subsequently transferred to the floor. Rapid response 7/29 for hypoxia and patient readmitted to the ICU. Blood cultures with peptostreptococcus and proteus. 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 She is receiving tube feedings at 55 mL/hr currently  Goal of Therapy:  Potassium 4.0 - 5.1 mmol/L Magnesium 2.0 - 2.4 mg/dL Electrolytes WNL  Plan: Electrolytes have been stable. Will plan to follow every few days or as clinically indicated. Patient tolerating tube feeds.  Pricilla Riffle, PharmD 06/06/2020 7:38 AM

## 2020-06-07 ENCOUNTER — Inpatient Hospital Stay: Payer: Medicare PPO

## 2020-06-07 DIAGNOSIS — A419 Sepsis, unspecified organism: Secondary | ICD-10-CM | POA: Diagnosis not present

## 2020-06-07 DIAGNOSIS — I5043 Acute on chronic combined systolic (congestive) and diastolic (congestive) heart failure: Secondary | ICD-10-CM | POA: Diagnosis not present

## 2020-06-07 DIAGNOSIS — R6521 Severe sepsis with septic shock: Secondary | ICD-10-CM | POA: Diagnosis not present

## 2020-06-07 LAB — CBC WITH DIFFERENTIAL/PLATELET
Abs Immature Granulocytes: 0.18 10*3/uL — ABNORMAL HIGH (ref 0.00–0.07)
Basophils Absolute: 0 10*3/uL (ref 0.0–0.1)
Basophils Relative: 0 %
Eosinophils Absolute: 0.7 10*3/uL — ABNORMAL HIGH (ref 0.0–0.5)
Eosinophils Relative: 8 %
HCT: 21.3 % — ABNORMAL LOW (ref 36.0–46.0)
Hemoglobin: 6.4 g/dL — ABNORMAL LOW (ref 12.0–15.0)
Immature Granulocytes: 2 %
Lymphocytes Relative: 23 %
Lymphs Abs: 1.9 10*3/uL (ref 0.7–4.0)
MCH: 30 pg (ref 26.0–34.0)
MCHC: 30 g/dL (ref 30.0–36.0)
MCV: 100 fL (ref 80.0–100.0)
Monocytes Absolute: 1.2 10*3/uL — ABNORMAL HIGH (ref 0.1–1.0)
Monocytes Relative: 15 %
Neutro Abs: 4.1 10*3/uL (ref 1.7–7.7)
Neutrophils Relative %: 52 %
Platelets: 257 10*3/uL (ref 150–400)
RBC: 2.13 MIL/uL — ABNORMAL LOW (ref 3.87–5.11)
RDW: 18.3 % — ABNORMAL HIGH (ref 11.5–15.5)
WBC: 7.9 10*3/uL (ref 4.0–10.5)
nRBC: 0.3 % — ABNORMAL HIGH (ref 0.0–0.2)

## 2020-06-07 LAB — GLUCOSE, CAPILLARY
Glucose-Capillary: 89 mg/dL (ref 70–99)
Glucose-Capillary: 93 mg/dL (ref 70–99)
Glucose-Capillary: 109 mg/dL — ABNORMAL HIGH (ref 70–99)
Glucose-Capillary: 133 mg/dL — ABNORMAL HIGH (ref 70–99)
Glucose-Capillary: 134 mg/dL — ABNORMAL HIGH (ref 70–99)

## 2020-06-07 MED ORDER — PROSOURCE TF PO LIQD
45.0000 mL | Freq: Three times a day (TID) | ORAL | Status: DC
  Administered 2020-06-07 – 2020-06-10 (×10): 45 mL
  Filled 2020-06-07: qty 45

## 2020-06-07 MED ORDER — ASCORBIC ACID 500 MG PO TABS
500.0000 mg | ORAL_TABLET | Freq: Every day | ORAL | Status: DC
  Administered 2020-06-07 – 2020-06-09 (×3): 500 mg
  Filled 2020-06-07 (×3): qty 1

## 2020-06-07 NOTE — Progress Notes (Signed)
States she doesn not feel good today. Febrile last night. States her right ear and head hurt-given prn Tylenol and Morphine. Dr. Belia Heman informed.

## 2020-06-07 NOTE — Progress Notes (Signed)
Resumed PRVC mode due to increased RR (36) and decreased sats (88%)

## 2020-06-07 NOTE — Progress Notes (Signed)
CRITICAL CARE NOTE  49yo F with TBI , trach status, seizure disorder as well as multiple additional comorbid conditions admitted with Septic Shock due to Proteus bacteremiaand peptostreptococcus in setting of Stage IV Sacral Decubitus with Osteomyelitis.  SIGNIFICANT EVENTS/STUDIES:  07/23: Pt admitted to stepdown unit with sepsis but remained housed in the ER pending bed availability  07/24: CT Pelvis revealed large sacral decubitus ulcer which has progressed significantly since prior study. Extension to the underlying sacrum and coccyx with evidence of osteomyelitis and bone destruction. No evidence of fluid collection or abscess. Stable midline ventral hernia as without evidence of bowel obstruction or strangulation. 07/24: Pt with hypotension PCCM team consulted to assist with management  7/26: General Surgery consulted, performed bedside debridement 7/26: Palliative Care consulted, pt's POA requests pt remain Full Code with aggressive medical interventions 7/27: ID consulted due to Proteus & peptostreptococcus  Bacteremia 7/27: 2D Echocardiogram: Left Ventricle: Left ventricular ejection fraction, by estimation, is 60  to 65%. The left ventricle has normal function. The left ventricle has no regional wall motion abnormalities. The left ventricular internal cavity size was normal in size. There is mild left ventricular hypertrophy. Left ventricular diastolic parameters  are indeterminate.  7/27: Weaned off Vasopressors 7/28: Transfer out to Med-Surg unit 7/29: RR called due to Hypoxia in setting of pulmonary edema; transfer to ICU; PCCM reconsulted 7/29: Cuffless shiley exchanged for Cuffed shiley, placed on vent; Hypotensive requiring vasopressors 7/30: Left Femoral CVC & Left Femoral A-line placed 8/01:A-line D/C 8/2-8/16 chronic Vent Dependant, UNABLE TO GET FEEDING TUBE, SACRAL STAGE 4 DECUB ULCER WITH OSTEO    Antimicrobials this admission: 7/24 cefepime >> 7/27 7/27 meropenem  >> 7/28 7/24 vancomycin >>7/28  7/28 Zosyn >>  Microbiology results: 7/24 BCx: peptostreptococcus species, proteus mirabilis 7/27 BCx: NGTD 7/30 BCx: NGTD 7/25 MRSA PCR: negative   CC  follow up respiratory failure  SUBJECTIVE Patient remains  ill Prognosis is guarded Alert and awake   BP 93/65   Pulse (!) 103   Temp 98.8 F (37.1 C)   Resp (!) 25   Ht 5' 4.02" (1.626 m)   Wt 80 kg   LMP  (LMP Unknown)   SpO2 91%   BMI 30.26 kg/m    I/O last 3 completed shifts: In: 3077.3 [I.V.:360.7; NG/GT:2054.4; IV Piggyback:662.2] Out: 2650 [Urine:2650] Total I/O In: -  Out: 950 [Urine:950]  SpO2: 91 % O2 Flow Rate (L/min): 10 L/min FiO2 (%): 21 %  Estimated body mass index is 30.26 kg/m as calculated from the following:   Height as of this encounter: 5' 4.02" (1.626 m).   Weight as of this encounter: 80 kg.  SIGNIFICANT EVENTS   REVIEW OF SYSTEMS  PATIENT IS UNABLE TO PROVIDE COMPLETE REVIEW OF SYSTEMS DUE TO SEVERE CRITICAL ILLNESS   Pressure Injury 11/15/19 Coccyx Stage 3 -  Full thickness tissue loss. Subcutaneous fat may be visible but bone, tendon or muscle are NOT exposed. red;bleeding (Active)  11/15/19 1500  Location: Coccyx  Location Orientation:   Staging: Stage 3 -  Full thickness tissue loss. Subcutaneous fat may be visible but bone, tendon or muscle are NOT exposed.  Wound Description (Comments): red;bleeding  Present on Admission: Yes     Pressure Injury 03/02/20 Thigh Posterior;Proximal;Right Stage 2 -  Partial thickness loss of dermis presenting as a shallow open injury with a red, pink wound bed without slough. (Active)  03/02/20 2100  Location: Thigh  Location Orientation: Posterior;Proximal;Right  Staging: Stage 2 -  Partial thickness  loss of dermis presenting as a shallow open injury with a red, pink wound bed without slough.  Wound Description (Comments):   Present on Admission: Yes     Pressure Injury 03/02/20 Heel Left Unstageable  - Full thickness tissue loss in which the base of the injury is covered by slough (yellow, tan, gray, green or brown) and/or eschar (tan, brown or black) in the wound bed. (Active)  03/02/20 2100  Location: Heel  Location Orientation: Left  Staging: Unstageable - Full thickness tissue loss in which the base of the injury is covered by slough (yellow, tan, gray, green or brown) and/or eschar (tan, brown or black) in the wound bed.  Wound Description (Comments):   Present on Admission: Yes     Pressure Injury 05/15/20 Back Posterior;Mid Stage 3 -  Full thickness tissue loss. Subcutaneous fat may be visible but bone, tendon or muscle are NOT exposed. open red area with gray and yellow base (Active)  05/15/20 1000  Location: Back  Location Orientation: Posterior;Mid  Staging: Stage 3 -  Full thickness tissue loss. Subcutaneous fat may be visible but bone, tendon or muscle are NOT exposed.  Wound Description (Comments): open red area with gray and yellow base  Present on Admission: Yes     Review of Systems: LIMITED   PHYSICAL EXAMINATION:  GENERAL:critically ill appearing, +resp distress  HEAD: Normocephalic, atraumatic.  EYES: Pupils equal, round, reactive to light.  No scleral icterus.  MOUTH: Moist mucosal membrane. NECK: Supple. No thyromegaly. No nodules. No JVD.  PULMONARY: +rhonchi, +wheezing CARDIOVASCULAR: S1 and S2. Regular rate and rhythm. No murmurs, rubs, or gallops.  GASTROINTESTINAL: Soft, nontender, -distended. Positive bowel sounds.  MUSCULOSKELETAL: No swelling, clubbing, or edema.  NEUROLOGIC: alert  SKIN stage 4 sacral decub     MEDICATIONS: I have reviewed all medications and confirmed regimen as documented   CULTURE RESULTS   Recent Results (from the past 240 hour(s))  CULTURE, BLOOD (ROUTINE X 2) w Reflex to ID Panel     Status: None (Preliminary result)   Collection Time: 06/06/20  2:22 PM   Specimen: BLOOD  Result Value Ref Range Status   Specimen  Description BLOOD BLOOD LEFT HAND  Final   Special Requests   Final    BOTTLES DRAWN AEROBIC AND ANAEROBIC Blood Culture adequate volume   Culture   Final    NO GROWTH < 24 HOURS Performed at [facility], [address], [place], [place] [address]    Report Status PENDING  Incomplete          IMAGING    DG Chest 1 View  Result Date: 06/07/2020 CLINICAL DATA:  49 year old female status post rapid response. Hypoxia. EXAM: CHEST  1 VIEW COMPARISON:  Chest radiograph dated 06/01/2020. FINDINGS: Endotracheal tube approximately 4 cm above the carina. Enteric tube extends below the diaphragm with tip beyond the inferior margin of the image. There is a confluent area of nodular and streaky density in the right perihilar region, new or progressed since the prior radiograph most concerning for developing infiltrate. Clinical correlation is recommended no pleural effusion. No pneumothorax. The cardiac silhouette is within limits. No acute osseous pathology. IMPRESSION: 1. Right perihilar nodular and streaky density most concerning for developing infiltrate. Clinical correlation recommended. 2. Endotracheal tube above the carina. Electronically Signed   By: Anner Crete M.D.   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, dysphagia, stage 3 pressure injury) Signs/Symptoms: severe fat depletion, severe muscle depletion, percent weight loss Percent weight loss: 22.8 % Interventions: Refer to RD note for recommendations      ASSESSMENT AND PLAN SYNOPSIS   Severe ACUTE Hypoxic and Hypercapnic Respiratory Failure chronic Vent dependant Stage 4 sacral decub ulcer  -continue PS  support -continue Bronchodilator Therapy -Wean Fio2 and PEEP as tolerated -will perform SAT/SBT when  respiratory parameters are met -VAP/VENT bundle implementation   ACUTE KIDNEY INJURY/Renal Failure -continue Foley Catheter-assess need -Avoid nephrotoxic agents -Follow urine output, BMP -Ensure adequate renal perfusion, optimize oxygenation -Renal dose medications   SHOCK-SEPSIS/HYPOVOLUMIC -use vasopressors to keep MAP>65 as needed   CARDIAC ICU monitoring  INFECTIOUS DISEASE -continue antibiotics as prescribed -follow up cultures -follow up ID consultation   GI GI PROPHYLAXIS as indicated GI and IR assess for PEG tube/feeding tube-not an ideal candidate  NUTRITIONAL STATUS 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, dysphagia, stage 3 pressure injury) Signs/Symptoms: severe fat depletion, severe muscle depletion, percent weight loss Percent weight loss: 22.8 % Interventions: Refer to RD note for recommendations   DIET-->TF's as tolerated Constipation protocol as indicated  ENDO - will use ICU hypoglycemic\Hyperglycemia protocol if indicated     ELECTROLYTES -follow labs as needed -replace as needed -pharmacy consultation and following   DVT/GI PRX ordered and assessed TRANSFUSIONS AS NEEDED MONITOR FSBS I Assessed the need for Labs I Assessed the need for Foley I Assessed the need for Central Venous Line Family Discussion when available I Assessed the need for Mobilization I made an Assessment of medications to be adjusted accordingly Safety Risk assessment completed   CASE DISCUSSED IN MULTIDISCIPLINARY ROUNDS WITH ICU TEAM  Critical Care Time devoted to patient care services described in this note is 32 minutes.   Overall, patient is critically ill, prognosis is guarded.    Corrin Parker, M.D.  [facility]  Medical Director [place] Director [facility] Cardio-Pulmonary Department

## 2020-06-07 NOTE — TOC Progression Note (Signed)
Transition of Care [facility]) - Progression Note    Patient Details  Name: Joanne [name] MRN: [id number] Date of Birth: [birth year]  Transition of Care [facility]) CM/SW Contact  Marina Goodell Phone Number: [phone]  06/07/2020, 1:19 PM  Clinical Narrative:     CSW spoke with Darl Pikes at [facility] for a status update.  The insurance authorization is still pending.  Expected Discharge Plan: Skilled Nursing Facility Barriers to Discharge: Continued Medical Work up  Expected Discharge Plan and Services Expected Discharge Plan: Skilled Nursing Facility     Post Acute Care Choice: Skilled Nursing Facility Living arrangements for the past 2 months: Skilled Nursing Facility                                       Social Determinants of Health (SDOH) Interventions    Readmission Risk Interventions Readmission Risk Prevention Plan 03/10/2020 03/03/2020  Transportation Screening Complete Complete  PCP or Specialist Appt within 3-5 Days Complete Complete  HRI or Home Care Consult Complete Complete  Social Work Consult for Recovery Care Planning/Counseling Complete -  Palliative Care Screening Not Applicable Complete  Medication Review [employer]) Complete Complete  Some recent data might be hidden
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
## 2020-06-07 NOTE — Progress Notes (Signed)
Pharmacy Electrolyte Monitoring Consult:  Pharmacy consulted to assist in monitoring and replacing electrolytes in this 49 y.o. female admitted on 05/15/2020  Labs:  Sodium (mmol/L)  Date Value  06/05/2020 139   Potassium (mmol/L)  Date Value  06/05/2020 3.8   Magnesium (mg/dL)  Date Value  14/07/3012 2.0   Phosphorus (mg/dL)  Date Value  14/38/8875 3.0   Calcium (mg/dL)  Date Value  79/72/8206 7.6 (L)   Albumin (g/dL)  Date Value  01/56/1537 1.5 (L)   Corrected Ca: 9.24 mg/dL  49 year old female with h/o TBI, seizures, hypothyroidism, diabetes. Patient admitted to ICU and subsequently transferred to the floor. Rapid response 7/29 for hypoxia and patient readmitted to the ICU. Blood cultures with peptostreptococcus and proteus. 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 She is receiving tube feedings at 55 mL/hr currently  Goal of Therapy:  Potassium 4.0 - 5.1 mmol/L Magnesium 2.0 - 2.4 mg/dL Electrolytes WNL  Plan: Electrolytes have been stable. Tolerating tube feeds. Will plan to follow approximately once weekly or as clinically indicated.  Pricilla Riffle, PharmD 06/07/2020 12:13 PM

## 2020-06-08 ENCOUNTER — Inpatient Hospital Stay: Payer: Medicare PPO

## 2020-06-08 LAB — BASIC METABOLIC PANEL
Anion gap: 14 (ref 5–15)
BUN: 21 mg/dL — ABNORMAL HIGH (ref 6–20)
CO2: 25 mmol/L (ref 22–32)
Calcium: 7.7 mg/dL — ABNORMAL LOW (ref 8.9–10.3)
Chloride: 104 mmol/L (ref 98–111)
Creatinine, Ser: 0.47 mg/dL (ref 0.44–1.00)
GFR calc Af Amer: >60 mL/min (ref >60)
GFR calc non Af Amer: >60 mL/min (ref >60)
Glucose, Bld: 106 mg/dL — ABNORMAL HIGH (ref 70–99)
Potassium: 3.8 mmol/L (ref 3.5–5.1)
Sodium: 143 mmol/L (ref 135–145)

## 2020-06-08 LAB — GLUCOSE, CAPILLARY
Glucose-Capillary: 106 mg/dL — ABNORMAL HIGH (ref 70–99)
Glucose-Capillary: 108 mg/dL — ABNORMAL HIGH (ref 70–99)
Glucose-Capillary: 109 mg/dL — ABNORMAL HIGH (ref 70–99)
Glucose-Capillary: 114 mg/dL — ABNORMAL HIGH (ref 70–99)
Glucose-Capillary: 86 mg/dL (ref 70–99)
Glucose-Capillary: 90 mg/dL (ref 70–99)
Glucose-Capillary: 95 mg/dL (ref 70–99)

## 2020-06-08 LAB — CBC WITH DIFFERENTIAL/PLATELET
Abs Immature Granulocytes: 0.28 10*3/uL — ABNORMAL HIGH (ref 0.00–0.07)
Basophils Absolute: 0 10*3/uL (ref 0.0–0.1)
Basophils Relative: 0 %
Eosinophils Absolute: 0.6 10*3/uL — ABNORMAL HIGH (ref 0.0–0.5)
Eosinophils Relative: 5 %
HCT: 24.2 % — ABNORMAL LOW (ref 36.0–46.0)
Hemoglobin: 7.2 g/dL — ABNORMAL LOW (ref 12.0–15.0)
Immature Granulocytes: 2 %
Lymphocytes Relative: 11 %
Lymphs Abs: 1.5 10*3/uL (ref 0.7–4.0)
MCH: 29.6 pg (ref 26.0–34.0)
MCHC: 29.8 g/dL — ABNORMAL LOW (ref 30.0–36.0)
MCV: 99.6 fL (ref 80.0–100.0)
Monocytes Absolute: 1.4 10*3/uL — ABNORMAL HIGH (ref 0.1–1.0)
Monocytes Relative: 10 %
Neutro Abs: 9.5 10*3/uL — ABNORMAL HIGH (ref 1.7–7.7)
Neutrophils Relative %: 72 %
Platelets: 265 10*3/uL (ref 150–400)
RBC: 2.43 MIL/uL — ABNORMAL LOW (ref 3.87–5.11)
RDW: 18.6 % — ABNORMAL HIGH (ref 11.5–15.5)
WBC: 13.3 10*3/uL — ABNORMAL HIGH (ref 4.0–10.5)
nRBC: 0.2 % (ref 0.0–0.2)

## 2020-06-08 MED ORDER — VANCOMYCIN HCL 1750 MG/350ML IV SOLN: 1750.0000 mg | INTRAVENOUS | Status: DC

## 2020-06-08 MED ORDER — VANCOMYCIN HCL 1750 MG/350ML IV SOLN
1750.0000 mg | Freq: Once | INTRAVENOUS | Status: DC
  Filled 2020-06-08: qty 350

## 2020-06-08 NOTE — Progress Notes (Signed)
Pharmacy Antibiotic Note  Joanne [name] is a 49 y.o. female admitted on 05/15/2020 with pneumonia.  Pharmacy has been consulted for Vancomycin dosing.  Plan: Vancomycin 1750 mg IV Q 24 hrs. Goal AUC 400-550. Expected AUC: 469.6, Css min 9.3 SCr used: 0.8   Height: 5' 4.02" (162.6 cm) Weight: 80 kg (176 lb 5.9 oz) IBW/kg (Calculated) : 54.74  Temp (24hrs), Avg:100.2 F (37.9 C), Min:98.7 F (37.1 C), Max:102.1 F (38.9 C)  Recent Labs  Lab 06/02/20 0533 06/02/20 0533 06/03/20 0511 06/03/20 0511 06/04/20 0416 06/05/20 0440 06/06/20 1043 06/06/20 1422 06/07/20 0328 06/08/20 0417  WBC 6.5   < > 5.8   < > 6.4 6.3 7.2  --  7.9 13.3*  CREATININE 0.36*  --  0.42*  --  0.48 0.41*  --   --   --  0.47  LATICACIDVEN  --   --   --   --   --   --   --  1.3  --   --    < > = values in this interval not displayed.    Estimated Creatinine Clearance: 86.1 mL/min (by C-G formula based on SCr of 0.47 mg/dL).    Allergies  Allergen Reactions   Kaopectate  [Attapulgite] Itching   Thiopental Itching   Tomato     Antimicrobials this admission:   >>    >>   Dose adjustments this admission:   Microbiology results:  BCx:   UCx:    Sputum:    MRSA PCR:   Thank you for allowing pharmacy to be a part of this patients care.  Valrie Hart A 06/08/2020 6:30 AM

## 2020-06-08 NOTE — Progress Notes (Signed)
Pharmacy Electrolyte Monitoring Consult:  Pharmacy consulted to assist in monitoring and replacing electrolytes in this 49 y.o. female admitted on 05/15/2020  Labs:  Sodium (mmol/L)  Date Value  06/08/2020 143   Potassium (mmol/L)  Date Value  06/08/2020 3.8   Magnesium (mg/dL)  Date Value  78/46/9629 2.0   Phosphorus (mg/dL)  Date Value  52/84/1324 3.0   Calcium (mg/dL)  Date Value  40/07/2724 7.7 (L)   Albumin (g/dL)  Date Value  36/64/4034 1.5 (L)   Corrected Ca: 9.31 mg/dL  49 year old female with h/o TBI, seizures, hypothyroidism, diabetes. Patient admitted to ICU and subsequently transferred to the floor. Rapid response 7/29 for hypoxia and patient readmitted to the ICU. Blood cultures with peptostreptococcus and proteus. 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 She is receiving tube feedings at 55 mL/hr currently  Goal of Therapy:  Potassium 4.0 - 5.1 mmol/L Magnesium 2.0 - 2.4 mg/dL Electrolytes WNL  Plan: Electrolytes remain stable. Tolerating tube feeds. Will plan to follow approximately once weekly or as clinically indicated.  Pricilla Riffle, PharmD 06/08/2020 8:00 AM
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
## 2020-06-08 NOTE — TOC Progression Note (Signed)
Transition of Care [facility]) - Progression Note    Patient Details  Name: Joanne [name] MRN: [id number] Date of Birth: [birth year]  Transition of Care [facility]) CM/SW Contact  Marina Goodell Phone Number: [phone] 06/08/2020, 11:22 AM  Clinical Narrative:     CSW spoke with Darl Pikes at [facility], she stated the insurance company requested a peer to peer with Dr. Belia Heman.  This CSW updated Dr. Belia Heman on peer-to-peer requested.  Dr. Belia Heman requested [facility] to contact him directly.  CSW relayed message to [facility].  Expected Discharge Plan: Skilled Nursing Facility Barriers to Discharge: Continued Medical Work up  Expected Discharge Plan and Services Expected Discharge Plan: Skilled Nursing Facility     Post Acute Care Choice: Skilled Nursing Facility Living arrangements for the past 2 months: Skilled Nursing Facility                                       Social Determinants of Health (SDOH) Interventions    Readmission Risk Interventions Readmission Risk Prevention Plan 03/10/2020 03/03/2020  Transportation Screening Complete Complete  PCP or Specialist Appt within 3-5 Days Complete Complete  HRI or Home Care Consult Complete Complete  Social Work Consult for Recovery Care Planning/Counseling Complete -  Palliative Care Screening Not Applicable Complete  Medication Review [employer]) Complete Complete  Some recent data might be hidden

## 2020-06-09 LAB — CBC WITH DIFFERENTIAL/PLATELET
Abs Immature Granulocytes: 0.38 10*3/uL — ABNORMAL HIGH (ref 0.00–0.07)
Basophils Absolute: 0 10*3/uL (ref 0.0–0.1)
Basophils Relative: 0 %
Eosinophils Absolute: 0.5 10*3/uL (ref 0.0–0.5)
Eosinophils Relative: 5 %
HCT: 24.3 % — ABNORMAL LOW (ref 36.0–46.0)
Hemoglobin: 7.3 g/dL — ABNORMAL LOW (ref 12.0–15.0)
Immature Granulocytes: 4 %
Lymphocytes Relative: 15 %
Lymphs Abs: 1.6 10*3/uL (ref 0.7–4.0)
MCH: 30.3 pg (ref 26.0–34.0)
MCHC: 30 g/dL (ref 30.0–36.0)
MCV: 100.8 fL — ABNORMAL HIGH (ref 80.0–100.0)
Monocytes Absolute: 1.3 10*3/uL — ABNORMAL HIGH (ref 0.1–1.0)
Monocytes Relative: 13 %
Neutro Abs: 6.7 10*3/uL (ref 1.7–7.7)
Neutrophils Relative %: 63 %
Platelets: 278 10*3/uL (ref 150–400)
RBC: 2.41 MIL/uL — ABNORMAL LOW (ref 3.87–5.11)
RDW: 18.9 % — ABNORMAL HIGH (ref 11.5–15.5)
WBC: 10.5 10*3/uL (ref 4.0–10.5)
nRBC: 0.4 % — ABNORMAL HIGH (ref 0.0–0.2)

## 2020-06-09 LAB — BASIC METABOLIC PANEL
Anion gap: 6 (ref 5–15)
BUN: 21 mg/dL — ABNORMAL HIGH (ref 6–20)
CO2: 27 mmol/L (ref 22–32)
Calcium: 7.9 mg/dL — ABNORMAL LOW (ref 8.9–10.3)
Chloride: 107 mmol/L (ref 98–111)
Creatinine, Ser: <0.3 mg/dL — ABNORMAL LOW (ref 0.44–1.00)
Glucose, Bld: 93 mg/dL (ref 70–99)
Potassium: 3.8 mmol/L (ref 3.5–5.1)
Sodium: 140 mmol/L (ref 135–145)

## 2020-06-09 LAB — GLUCOSE, CAPILLARY
Glucose-Capillary: 84 mg/dL (ref 70–99)
Glucose-Capillary: 75 mg/dL (ref 70–99)
Glucose-Capillary: 101 mg/dL — ABNORMAL HIGH (ref 70–99)
Glucose-Capillary: 107 mg/dL — ABNORMAL HIGH (ref 70–99)
Glucose-Capillary: 108 mg/dL — ABNORMAL HIGH (ref 70–99)
Glucose-Capillary: 82 mg/dL (ref 70–99)

## 2020-06-09 LAB — MAGNESIUM: Magnesium: 2.1 mg/dL (ref 1.7–2.4)

## 2020-06-09 MED ORDER — CHLORHEXIDINE GLUCONATE 0.12 % MT SOLN
OROMUCOSAL | Status: AC
  Filled 2020-06-09: qty 15

## 2020-06-09 NOTE — Progress Notes (Signed)
Pharmacy Electrolyte Monitoring Consult:  Pharmacy consulted to assist in monitoring and replacing electrolytes in this 49 y.o. female admitted on 05/15/2020  Labs:  Sodium (mmol/L)  Date Value  06/09/2020 140   Potassium (mmol/L)  Date Value  06/09/2020 3.8   Magnesium (mg/dL)  Date Value  20/80/2233 2.1   Phosphorus (mg/dL)  Date Value  61/22/4497 3.0   Calcium (mg/dL)  Date Value  53/00/5110 7.9 (L)   Albumin (g/dL)  Date Value  21/08/7355 1.5 (L)   Corrected Ca: 9.57 mg/dL  49 year old female with h/o TBI, seizures, hypothyroidism, diabetes. Patient admitted to ICU and subsequently transferred to the floor. Rapid response 7/29 for hypoxia and patient readmitted to the ICU. Blood cultures with peptostreptococcus and proteus. 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 She is receiving tube feedings at 55 mL/hr currently  Goal of Therapy:  Potassium 4.0 - 5.1 mmol/L Magnesium 2.0 - 2.4 mg/dL Electrolytes WNL  Plan: Electrolytes remain stable. Tolerating tube feeds. Will plan to follow approximately once weekly or as clinically indicated.  Pricilla Riffle, PharmD 06/09/2020 11:05 AM

## 2020-06-09 NOTE — Progress Notes (Signed)
CRITICAL CARE NOTE 49 yo AAM with TBI , trach status, seizure disorder as well as multiple additional comorbid conditions admitted with Septic Shock due to Proteus bacteremiaand peptostreptococcus in setting of Stage IV Sacral Decubitus with Osteomyelitis.  SIGNIFICANT EVENTS/STUDIES:  07/23: Pt admitted to stepdown unit with sepsis but remained housed in the ER pending bed availability  07/24: CT Pelvis revealed large sacral decubitus ulcer which has progressed significantly since prior study. Extension to the underlying sacrum and coccyx with evidence of osteomyelitis and bone destruction. No evidence of fluid collection or abscess. Stable midline ventral hernia as without evidence of bowel obstruction or strangulation. 07/24: Pt with hypotension PCCM team consulted to assist with management  7/26:General Surgery consulted, performed bedside debridement 7/26: Palliative Care consulted, pt's POA requests pt remain Full Code with aggressive medical interventions 7/27:ID consulted due to Proteus &peptostreptococcus Bacteremia 7/27: 2D Echocardiogram:Left Ventricle: Left ventricular ejection fraction, by estimation, is 60 to 65%. The left ventricle has normal function. The left ventricle has no regional wall motion abnormalities. The left ventricular internal cavity size was normal in size. There is mild left ventricular hypertrophy. Left ventricular diastolic parameters  are indeterminate.  7/27:Weaned off Vasopressors 7/28: Transfer out to Med-Surg unit 7/29: RR called due to Hypoxia in setting of pulmonary edema; transfer to ICU; PCCM reconsulted 7/29: Cuffless shiley exchanged for Cuffed shiley, placed on vent; Hypotensive requiring vasopressors 7/30: Left Femoral CVC & Left Femoral A-line placed 8/01:A-line D/C 8/2-8/16 chronic Vent Dependant, UNABLE TO GET FEEDING TUBE, SACRAL STAGE 4 DECUB ULCER WITH OSTEO 8/18: Remains on PRVC Mode on vent, tolerating tube feeds  CC  follow up  respiratory failure  SUBJECTIVE Remains critically ill Prognosis is guarded On PRVC Afebrile, no pressors, VSS   BP 117/80   Pulse (!) 111   Temp 98.9 F (37.2 C) (Oral)   Resp (!) 25   Ht 5' 4.02" (1.626 m)   Wt 83.1 kg   LMP  (LMP Unknown)   SpO2 99%   BMI 31.43 kg/m    I/O last 3 completed shifts: In: 2714.8 [I.V.:88.2; NG/GT:1865; IV Piggyback:761.6] Out: 3300 [Urine:3300] Total I/O In: 138.3 [IV Piggyback:138.3] Out: -   SpO2: 99 % O2 Flow Rate (L/min): 10 L/min FiO2 (%): 21 %  Estimated body mass index is 31.43 kg/m as calculated from the following:   Height as of this encounter: 5' 4.02" (1.626 m).   Weight as of this encounter: 83.1 kg.     REVIEW OF SYSTEMS PATIENT IS UNABLE TO PROVIDE COMPLETE REVIEW OF SYSTEMS DUE TO SEVERE CRITICAL ILLNESS   Pressure Injury 11/15/19 Coccyx Stage 3 -  Full thickness tissue loss. Subcutaneous fat may be visible but bone, tendon or muscle are NOT exposed. red;bleeding (Active)  11/15/19 1500  Location: Coccyx  Location Orientation:   Staging: Stage 3 -  Full thickness tissue loss. Subcutaneous fat may be visible but bone, tendon or muscle are NOT exposed.  Wound Description (Comments): red;bleeding  Present on Admission: Yes     Pressure Injury 03/02/20 Thigh Posterior;Proximal;Right Stage 2 -  Partial thickness loss of dermis presenting as a shallow open injury with a red, pink wound bed without slough. (Active)  03/02/20 2100  Location: Thigh  Location Orientation: Posterior;Proximal;Right  Staging: Stage 2 -  Partial thickness loss of dermis presenting as a shallow open injury with a red, pink wound bed without slough.  Wound Description (Comments):   Present on Admission: Yes     Pressure Injury 03/02/20 Heel Left  Unstageable - Full thickness tissue loss in which the base of the injury is covered by slough (yellow, tan, gray, green or brown) and/or eschar (tan, brown or black) in the wound bed. (Active)   03/02/20 2100  Location: Heel  Location Orientation: Left  Staging: Unstageable - Full thickness tissue loss in which the base of the injury is covered by slough (yellow, tan, gray, green or brown) and/or eschar (tan, brown or black) in the wound bed.  Wound Description (Comments):   Present on Admission: Yes     Pressure Injury 05/15/20 Back Posterior;Mid Stage 3 -  Full thickness tissue loss. Subcutaneous fat may be visible but bone, tendon or muscle are NOT exposed. open red area with gray and yellow base (Active)  05/15/20 1000  Location: Back  Location Orientation: Posterior;Mid  Staging: Stage 3 -  Full thickness tissue loss. Subcutaneous fat may be visible but bone, tendon or muscle are NOT exposed.  Wound Description (Comments): open red area with gray and yellow base  Present on Admission: Yes      PHYSICAL EXAMINATION:  GENERAL:Acute on chronically ill appearing female, sitting in bed, on vent, in NAD HEAD: Atraumatic, normocephalic EYES: Pupils PERRLA, no scleral icterus MOUTH: Moist mucus membranes NECK: Neck supple, no JVD PULMONARY: Clear diminished to auscultation bilterally, even, nonlabored, vent assisted CARDIOVASCULAR: Tachycardia, regular rhythm, s1s2, no M/R/G GASTROINTESTINAL: Soft, nontender, nondistended, no guarding or rebound tenderness, BS + x4   MUSCULOSKELETAL: No swelling, clubbing, or edema.  NEUROLOGIC: Awake, able to nod to questions, follows commands SKIN:warm and dry. Sacral decubitus (present on admission)  MEDICATIONS: I have reviewed all medications and confirmed regimen as documented   CULTURE RESULTS   Recent Results (from the past 240 hour(s))  CULTURE, BLOOD (ROUTINE X 2) w Reflex to ID Panel     Status: None (Preliminary result)   Collection Time: 06/06/20  2:22 PM   Specimen: BLOOD  Result Value Ref Range Status   Specimen Description BLOOD BLOOD LEFT HAND  Final   Special Requests   Final    BOTTLES DRAWN AEROBIC AND ANAEROBIC  Blood Culture adequate volume   Culture   Final    NO GROWTH 3 DAYS Performed at [facility], [address] [place], [place] [address]    Report Status PENDING  Incomplete  Culture, respiratory     Status: None (Preliminary result)   Collection Time: 06/08/20  5:27 AM   Specimen: Tracheal Aspirate; Respiratory  Result Value Ref Range Status   Specimen Description   Final    TRACHEAL ASPIRATE Performed at [facility], [address], [place], [place] [address]    Special Requests   Final    NONE Performed at [facility], [address] [place], [place] [address]    Gram Stain PENDING  Incomplete   Culture   Final    CULTURE REINCUBATED FOR BETTER GROWTH Performed at [facility], [address] [place], [place] [address]    Report Status PENDING  Incomplete          IMAGING    No results found.   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, dysphagia, stage 3 pressure injury) Signs/Symptoms: severe fat depletion, severe muscle depletion, percent weight loss Percent weight loss: 22.8 % Interventions: Refer to RD note for recommendations        ASSESSMENT  AND PLAN SYNOPSIS   Severe ACUTE Hypoxic and Hypercapnic Respiratory Failure chronic Vent dependant Stage 4 sacral decub ulcer  Severe ACUTE Hypoxic and Hypercapnic Respiratory Failure -continue Full MV support   ACUTE KIDNEY INJURY/Renal Failure>>resolved Monitor I&O's / urinary output Follow BMP Ensure adequate renal perfusion Avoid nephrotoxic agents as able Replace electrolytes as indicated     ID -continue IV abx as prescibed -follow up cultures  GI GI PROPHYLAXIS as indicated  DIET-->TF's as tolerated Constipation protocol as indicated  ENDO - will use ICU  hypoglycemic\Hyperglycemia protocol if indicated     ELECTROLYTES -follow labs as needed -replace as needed -pharmacy consultation and following     PCCM ATTENDING ATTESTATION:  I have evaluated patient with the APP, I personally  reviewed database in its entirety and discussed care plan in detail. In addition, this patient was discussed on multidisciplinary rounds.   I agree with assessment and plan.  Critical Care Time devoted to patient care services described in this note is 32 minutes.    Lucie Leather, M.D.  [facility]  Medical Director [facility] Medical Director [facility] Cardio-Pulmonary Department

## 2020-06-09 NOTE — Progress Notes (Signed)
Uneventful day, vitals have remained WNL. Pt alert and follows commands, pt given one prn dose of morphine for pain. Will continue to monitor.

## 2020-06-10 ENCOUNTER — Ambulatory Visit (HOSPITAL_COMMUNITY)
Admission: AD | Admit: 2020-06-10 | Discharge: 2020-06-10 | Disposition: A | Discharge status: Home / Self Care | Payer: Medicare PPO | Source: Other Acute Inpatient Hospital | Attending: Internal Medicine | Admitting: Internal Medicine

## 2020-06-10 DIAGNOSIS — A419 Sepsis, unspecified organism: Secondary | ICD-10-CM | POA: Insufficient documentation

## 2020-06-10 LAB — CBC WITH DIFFERENTIAL/PLATELET
Abs Immature Granulocytes: 0.38 10*3/uL — ABNORMAL HIGH (ref 0.00–0.07)
Basophils Absolute: 0 10*3/uL (ref 0.0–0.1)
Basophils Relative: 0 %
Eosinophils Absolute: 0.4 10*3/uL (ref 0.0–0.5)
Eosinophils Relative: 3 %
HCT: 23.4 % — ABNORMAL LOW (ref 36.0–46.0)
Hemoglobin: 7.1 g/dL — ABNORMAL LOW (ref 12.0–15.0)
Immature Granulocytes: 3 %
Lymphocytes Relative: 13 %
Lymphs Abs: 1.6 10*3/uL (ref 0.7–4.0)
MCH: 30.6 pg (ref 26.0–34.0)
MCHC: 30.3 g/dL (ref 30.0–36.0)
MCV: 100.9 fL — ABNORMAL HIGH (ref 80.0–100.0)
Monocytes Absolute: 1.6 10*3/uL — ABNORMAL HIGH (ref 0.1–1.0)
Monocytes Relative: 12 %
Neutro Abs: 8.6 10*3/uL — ABNORMAL HIGH (ref 1.7–7.7)
Neutrophils Relative %: 69 %
Platelets: 320 10*3/uL (ref 150–400)
RBC: 2.32 MIL/uL — ABNORMAL LOW (ref 3.87–5.11)
RDW: 19.2 % — ABNORMAL HIGH (ref 11.5–15.5)
WBC: 12.6 10*3/uL — ABNORMAL HIGH (ref 4.0–10.5)
nRBC: 0.2 % (ref 0.0–0.2)

## 2020-06-10 LAB — BASIC METABOLIC PANEL
Anion gap: 9 (ref 5–15)
BUN: 21 mg/dL — ABNORMAL HIGH (ref 6–20)
CO2: 26 mmol/L (ref 22–32)
Calcium: 7.8 mg/dL — ABNORMAL LOW (ref 8.9–10.3)
Chloride: 105 mmol/L (ref 98–111)
Creatinine, Ser: 0.46 mg/dL (ref 0.44–1.00)
GFR calc Af Amer: >60 mL/min (ref >60)
GFR calc non Af Amer: >60 mL/min (ref >60)
Glucose, Bld: 102 mg/dL — ABNORMAL HIGH (ref 70–99)
Potassium: 3.8 mmol/L (ref 3.5–5.1)
Sodium: 140 mmol/L (ref 135–145)

## 2020-06-10 LAB — GLUCOSE, CAPILLARY
Glucose-Capillary: 103 mg/dL — ABNORMAL HIGH (ref 70–99)
Glucose-Capillary: 102 mg/dL — ABNORMAL HIGH (ref 70–99)
Glucose-Capillary: 102 mg/dL — ABNORMAL HIGH (ref 70–99)
Glucose-Capillary: 95 mg/dL (ref 70–99)

## 2020-06-10 LAB — MAGNESIUM: Magnesium: 2.2 mg/dL (ref 1.7–2.4)

## 2020-06-10 MED ORDER — AMOXICILLIN-POT CLAVULANATE 250-62.5 MG/5ML PO SUSR: 500.0000 mg | Freq: Three times a day (TID) | ORAL | 0 refills | Status: DC

## 2020-06-10 MED ORDER — SODIUM CHLORIDE 0.9 % IV SOLN: 3.0000 g | Freq: Four times a day (QID) | INTRAVENOUS | Status: DC

## 2020-06-10 MED ORDER — CARBAMAZEPINE 100 MG PO CHEW
50.0000 mg | CHEWABLE_TABLET | Freq: Four times a day (QID) | ORAL | 0 refills | Status: DC
Start: 2020-06-10 — End: 2021-04-24

## 2020-06-10 MED ORDER — LEVOTHYROXINE SODIUM 88 MCG PO TABS: 88.0000 ug | ORAL_TABLET | Freq: Every day | ORAL | Status: DC

## 2020-06-10 MED ORDER — AMOXICILLIN-POT CLAVULANATE 400-57 MG/5ML PO SUSR
500.0000 mg | Freq: Three times a day (TID) | ORAL | Status: DC
  Filled 2020-06-10 (×2): qty 6.3
  Filled 2020-06-10: qty 10

## 2020-06-10 NOTE — Plan of Care (Signed)

## 2020-06-10 NOTE — TOC Transition Note (Signed)
Transition of Care [facility]) - CM/SW Discharge Note   Patient Details  Name: Joanne [name] MRN: [id number] Date of Birth: [birth year]  [facility]) CM/SW Contact:  Marina Goodell Phone Number: [phone] 06/10/2020, 12:22 PM   Clinical Narrative:     CSW spoke with patient's legal guardian [name], confirming patient's transfer to [facility].  CSW spoke with Dr. Belia Heman to update him on d/c.  Patient will go to Room# 321, Report# [id number], Dr. Leonia Reader accepting physician.  CSW left [employer], assigned to patient, information on for patient transport and EMTALA. TOC consult complete.     Barriers to Discharge: Continued Medical Work up   Patient Goals and CMS Choice        Discharge Placement                       Discharge Plan and Services     Post Acute Care Choice: Skilled Nursing Facility                               Social Determinants of Health (SDOH) Interventions     Readmission Risk Interventions Readmission Risk Prevention Plan 03/10/2020 03/03/2020  Transportation Screening Complete Complete  PCP or Specialist Appt within 3-5 Days Complete Complete  HRI or Home Care Consult Complete Complete  Social Work Consult for Recovery Care Planning/Counseling Complete -  Palliative Care Screening Not Applicable Complete  Medication Review Oceanographer) Complete Complete  Some recent data might be hidden

## 2020-06-10 NOTE — Progress Notes (Signed)
[facility], vitals before transport are WNL. Gave report to Tiffany, Charity fundraiser with [facility]. Called [name] pts legal guardian and advised her where pt was going and gave them the address and room number. Pt stable upon discharge/transfer.

## 2020-06-10 NOTE — Progress Notes (Signed)
CRITICAL CARE NOTE 49 yo AAM with TBI , trach status, seizure disorder as well as multiple additional comorbid conditions admitted with Septic Shock due to Proteus bacteremiaand peptostreptococcus in setting of Stage IV Sacral Decubitus with Osteomyelitis.  SIGNIFICANT EVENTS/STUDIES:  07/23: Pt admitted to stepdown unit with sepsis but remained housed in the ER pending bed availability  07/24: CT Pelvis revealed large sacral decubitus ulcer which has progressed significantly since prior study. Extension to the underlying sacrum and coccyx with evidence of osteomyelitis and bone destruction. No evidence of fluid collection or abscess. Stable midline ventral hernia as without evidence of bowel obstruction or strangulation. 07/24: Pt with hypotension PCCM team consulted to assist with management  7/26:General Surgery consulted, performed bedside debridement 7/26: Palliative Care consulted, pt's POA requests pt remain Full Code with aggressive medical interventions 7/27:ID consulted due to Proteus &peptostreptococcus Bacteremia 7/27: 2D Echocardiogram:Left Ventricle: Left ventricular ejection fraction, by estimation, is 60 to 65%. The left ventricle has normal function. The left ventricle has no regional wall motion abnormalities. The left ventricular internal cavity size was normal in size. There is mild left ventricular hypertrophy. Left ventricular diastolic parameters  are indeterminate.  7/27:Weaned off Vasopressors 7/28: Transfer out to Med-Surg unit 7/29: RR called due to Hypoxia in setting of pulmonary edema; transfer to ICU; PCCM reconsulted 7/29: Cuffless shiley exchanged for Cuffed shiley, placed on vent; Hypotensive requiring vasopressors 7/30: Left Femoral CVC & Left Femoral A-line placed 8/01:A-line D/C 8/2-8/16 chronic Vent Dependant, UNABLE TO GET FEEDING TUBE, SACRAL STAGE 4 DECUB ULCER WITH OSTEO 8/18: Remains on PRVC Mode on vent, tolerating tube feeds  CC  Follow up  resp failure  SUBJECTIVE Remains ill Prognosis is guarded Plan for LTACH On vent, no pressors Alert and awake Follows commands  BP 96/67   Pulse (!) 117   Temp 99.2 F (37.3 C) (Oral)   Resp 16   Ht 5' 4.02" (1.626 m)   Wt 82.1 kg   LMP  (LMP Unknown)   SpO2 98%   BMI 31.05 kg/m    I/O last 3 completed shifts: In: 388.9 [IV Piggyback:388.9] Out: 3800 [Urine:3800] No intake/output data recorded.  SpO2: 98 % O2 Flow Rate (L/min): 10 L/min FiO2 (%): 21 %  Estimated body mass index is 31.05 kg/m as calculated from the following:   Height as of this encounter: 5' 4.02" (1.626 m).   Weight as of this encounter: 82.1 kg.     Review of Systems:  Gen:  Denies  fever, sweats, chills weight loss  HEENT: Denies blurred vision, double vision, ear pain, eye pain, hearing loss, nose bleeds, sore throat Cardiac:  No dizziness, chest pain or heaviness, chest tightness,edema, No JVD Resp:   No cough, -sputum production, -shortness of breath,-wheezing, -hemoptysis,  Other:  All other systems negative   Pressure Injury 11/15/19 Coccyx Stage 3 -  Full thickness tissue loss. Subcutaneous fat may be visible but bone, tendon or muscle are NOT exposed. red;bleeding (Active)  11/15/19 1500  Location: Coccyx  Location Orientation:   Staging: Stage 3 -  Full thickness tissue loss. Subcutaneous fat may be visible but bone, tendon or muscle are NOT exposed.  Wound Description (Comments): red;bleeding  Present on Admission: Yes     Pressure Injury 03/02/20 Thigh Posterior;Proximal;Right Stage 2 -  Partial thickness loss of dermis presenting as a shallow open injury with a red, pink wound bed without slough. (Active)  03/02/20 2100  Location: Thigh  Location Orientation: Posterior;Proximal;Right  Staging: Stage 2 -  Partial thickness loss of dermis presenting as a shallow open injury with a red, pink wound bed without slough.  Wound Description (Comments):   Present on Admission: Yes      Pressure Injury 03/02/20 Heel Left Unstageable - Full thickness tissue loss in which the base of the injury is covered by slough (yellow, tan, gray, green or brown) and/or eschar (tan, brown or black) in the wound bed. (Active)  03/02/20 2100  Location: Heel  Location Orientation: Left  Staging: Unstageable - Full thickness tissue loss in which the base of the injury is covered by slough (yellow, tan, gray, green or brown) and/or eschar (tan, brown or black) in the wound bed.  Wound Description (Comments):   Present on Admission: Yes     Pressure Injury 05/15/20 Back Posterior;Mid Stage 3 -  Full thickness tissue loss. Subcutaneous fat may be visible but bone, tendon or muscle are NOT exposed. open red area with gray and yellow base (Active)  05/15/20 1000  Location: Back  Location Orientation: Posterior;Mid  Staging: Stage 3 -  Full thickness tissue loss. Subcutaneous fat may be visible but bone, tendon or muscle are NOT exposed.  Wound Description (Comments): open red area with gray and yellow base  Present on Admission: Yes      PHYSICAL EXAMINATION:  GENERAL:Acute on chronically ill appearing female, sitting in bed, on vent, in NAD Neuro:without focal findings HEENT: PERRLA, EOM intact.   Pulmonary: normal breath sounds, No wheezing.  CardiovascularNormal S1,S2.  No m/r/g.   Abdomen: Benign, Soft, non-tender. Renal:  No costovertebral tenderness  GU:  Not performed at this time. Endoc: No evident thyromegaly Skin:   warm, no rashes, no ecchymosis  Extremities: normal, no cyanosis, clubbing. PSYCHIATRIC: Mood, affect within normal limits. Sacral decubitus (present on admission)  ALL OTHER ROS ARE NEGATIVE  MEDICATIONS: I have reviewed all medications and confirmed regimen as documented   CULTURE RESULTS   Recent Results (from the past 240 hour(s))  CULTURE, BLOOD (ROUTINE X 2) w Reflex to ID Panel     Status: None (Preliminary result)   Collection Time: 06/06/20   2:22 PM   Specimen: BLOOD  Result Value Ref Range Status   Specimen Description BLOOD BLOOD LEFT HAND  Final   Special Requests   Final    BOTTLES DRAWN AEROBIC AND ANAEROBIC Blood Culture adequate volume   Culture   Final    NO GROWTH 3 DAYS Performed at [facility], [address] [place], [place] [address]    Report Status PENDING  Incomplete  Culture, respiratory     Status: None (Preliminary result)   Collection Time: 06/08/20  5:27 AM   Specimen: Tracheal Aspirate; Respiratory  Result Value Ref Range Status   Specimen Description   Final    TRACHEAL ASPIRATE Performed at [facility], [address] [place], [place] [address]    Special Requests   Final    NONE Performed at [facility], [address] [place], [place] [address]    Gram Stain   Final    FEW WBC PRESENT, PREDOMINANTLY PMN ABUNDANT GRAM NEGATIVE RODS    Culture   Final    CULTURE REINCUBATED FOR BETTER GROWTH Performed at [facility], [address] [place], [place] [address]    Report Status PENDING  Incomplete          IMAGING    No results found.   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, dysphagia, stage 3 pressure injury) Signs/Symptoms: severe fat depletion, severe muscle depletion, percent weight loss Percent weight loss: 22.8 % Interventions: Refer to RD note for recommendations        ASSESSMENT AND PLAN SYNOPSIS   Admitted for septic shock and Severe ACUTE Hypoxic and Hypercapnic Respiratory Failure chronic Vent dependant Stage 4 sacral decub ulcer  Severe ACUTE Hypoxic and Hypercapnic Respiratory Failure -continue Full MV support wean to PS mode as tolerated   ACUTE KIDNEY INJURY/Renal Failure>>resolved  INFECTIOUS DISEASE -continue antibiotics as  prescribed -follow up cultures -follow up ID consultation   DIET-->TF's as tolerated Constipation protocol as indicated  ELECTROLYTES -follow labs as needed -replace as needed -pharmacy consultation and following   DVT/GI PRX ordered and assessed TRANSFUSIONS AS NEEDED MONITOR FSBS I Assessed the need for Labs I Assessed the need for Foley I Assessed the need for Central Venous Line Family Discussion when available I Assessed the need for Mobilization I made an Assessment of medications to be adjusted accordingly Safety Risk assessment completed  CASE DISCUSSED IN MULTIDISCIPLINARY ROUNDS WITH ICU TEAM     Shanel Prazak Santiago Glad, M.D.  [facility]  Medical Director [facility] Medical Director [facility] Cardio-Pulmonary Department

## 2020-06-10 NOTE — TOC Progression Note (Signed)
Transition of Care [facility]) - Progression Note    Patient Details  Name: Joanne [name] MRN: [id number] Date of Birth: 29-Sep-1971  [facility]) CM/SW Contact  Marina Goodell Phone Number: [phone] 06/10/2020, 8:26 AM  Clinical Narrative:     Left voicemail for [name] [phone], to update her of patient's bed offer at [facility].  Once this CSW receive Ms. Patterson's confirmation I will begin the d/c process.   Expected Discharge Plan: Skilled Nursing Facility Barriers to Discharge: Continued Medical Work up  Expected Discharge Plan and Services Expected Discharge Plan: Skilled Nursing Facility     Post Acute Care Choice: Skilled Nursing Facility Living arrangements for the past 2 months: Skilled Nursing Facility                                       Social Determinants of Health (SDOH) Interventions    Readmission Risk Interventions Readmission Risk Prevention Plan 03/10/2020 03/03/2020  Transportation Screening Complete Complete  PCP or Specialist Appt within 3-5 Days Complete Complete  HRI or Home Care Consult Complete Complete  Social Work Consult for Recovery Care Planning/Counseling Complete -  Palliative Care Screening Not Applicable Complete  Medication Review Oceanographer) Complete Complete  Some recent data might be hidden

## 2020-06-10 NOTE — Progress Notes (Addendum)
Called report into [facility] spoke to josephine per josephine, RN would like NG, Foley and IV left in for use at their facility.. Called [facility] they should be here about 5-530 for transport. Tried calling guardian to advise her of transport with no answer and unable to leave message on machine.

## 2020-06-10 NOTE — Discharge Summary (Signed)
Physician Discharge Summary  Patient ID: Joanne [name] MRN: [id number] DOB/AGE: 1971-04-19 50 y.o.  Admit date: 05/15/2020 Discharge date: 06/10/2020  Admission Diagnoses:SEPTIC SHOCK  Discharge Diagnoses:  Principal Problem:   Severe sepsis with septic shock [facility]) Active Problems:   Acute on chronic combined systolic and diastolic CHF (congestive heart failure) (HCC)   Tracheostomy status (HCC)   Traumatic brain injury ([place])   Hypernatremia   AKI (acute kidney injury) ([place])   Hypothyroidism   Hypokalemia   Weakness   Full code status   Protein-calorie malnutrition, severe   Mild malnutrition ([place])   Discharged Condition: STABLE Hospital Course:    49 yo AAM with TBI , trach status, seizure disorder as well as multiple additional comorbid conditions admitted with Septic Shock due to Proteus bacteremiaand peptostreptococcus in setting of Stage IV Sacral Decubitus with Osteomyelitis.  SIGNIFICANT EVENTS/STUDIES:  07/23: Pt admitted to stepdown unit with sepsis but remained housed in the ER pending bed availability  07/24: CT Pelvis revealed large sacral decubitus ulcer which has progressed significantly since prior study. Extension to the underlying sacrum and coccyx with evidence of osteomyelitis and bone destruction. No evidence of fluid collection or abscess. Stable midline ventral hernia as without evidence of bowel obstruction or strangulation. 07/24: Pt with hypotension PCCM team consulted to assist with management  7/26:General Surgery consulted, performed bedside debridement 7/26: Palliative Care consulted, pt's POA requests pt remain Full Code with aggressive medical interventions 7/27:ID consulted due to Proteus &peptostreptococcus Bacteremia 7/27: 2D Echocardiogram:Left Ventricle: Left ventricular ejection fraction, by estimation, is 60 to 65%. The left ventricle has normal function. The left ventricle has no regional wall motion abnormalities. The left  ventricular internal cavity size was normal in size. There is mild left ventricular hypertrophy. Left ventricular diastolic parameters  are indeterminate.  7/27:Weaned off Vasopressors 7/28: Transfer out to Med-Surg unit 7/29: RR called due to Hypoxia in setting of pulmonary edema; transfer to ICU; PCCM reconsulted 7/29: Cuffless shiley exchanged for Cuffed shiley, placed on vent; Hypotensive requiring vasopressors 7/30: Left Femoral CVC & Left Femoral A-line placed 8/01:A-line D/C 8/2-8/16 chronic Vent Dependant, UNABLE TO GET FEEDING TUBE, SACRAL STAGE 4 DECUB ULCER WITH OSTEO 8/18: Remains on PRVC Mode on vent, tolerating tube feeds  Admitted for septic shock and Severe ACUTE Hypoxic and Hypercapnic Respiratory Failurechronic Vent dependant Stage 4 sacral decub ulcer  Severe ACUTE Hypoxic and Hypercapnic Respiratory Failure -continue Full MV support wean to PS mode as tolerated   ACUTE KIDNEY INJURY/Renal Failure>>resolved  INFECTIOUS DISEASE -continue antibiotics as prescribed -follow up cultures -follow up ID consultation   DIET-->TF's as tolerated Constipation protocol as indicated  ELECTROLYTES -follow labs as needed -replace as needed -pharmacy consultation and following   Discharge Exam: Blood pressure 99/61, pulse (!) 109, temperature 99.1 F (37.3 C), temperature source Oral, resp. rate 16, height 5' 4.02" (1.626 m), weight 82.1 kg, SpO2 100 %. Acute on chronically ill appearing female, sitting in bed, on vent, in NAD Neuro:without focal findings HEENT: PERRLA, EOM intact.   Pulmonary: normal breath sounds, No wheezing.  CardiovascularNormal S1,S2.  No m/r/g.   Abdomen: Benign, Soft, non-tender. Renal:  No costovertebral tenderness  GU:  Not performed at this time. Endoc: No evident thyromegaly Skin:   warm, no rashes, no ecchymosis  Extremities: normal, no cyanosis, clubbing. PSYCHIATRIC: Mood, affect within normal limits. Sacral decubitus  (present on admission)   Disposition: LTACH  Discharge Instructions    [facility] pharmacist to adjust dose for Vancomycin, Aminoglycosides  and other anti-infective therapies as requested by physician.   Complete by: As directed    [facility] to provide Cath Flo 27m   Complete by: As directed    Administer for PICC line occlusion and as ordered by physician for other access device issues.   Anaphylaxis Kit: Provided to treat any anaphylactic reaction to the medication being provided to the patient if First Dose or when requested by physician   Complete by: As directed    Epinephrine 177mml vial / amp: Administer 0.6m35m0.6ml30mubcutaneously once for moderate to severe anaphylaxis, nurse to call physician and pharmacy when reaction occurs and call 911 if needed for immediate care   Diphenhydramine 50mg70mIV vial: Administer 25-50mg 55mM PRN for first dose reaction, rash, itching, mild reaction, nurse to call physician and pharmacy when reaction occurs   Sodium Chloride 0.9% NS 500ml I45mdminister if needed for hypovolemic blood pressure drop or as ordered by physician after call to physician with anaphylactic reaction   Change dressing on IV access line weekly and PRN   Complete by: As directed    Flush IV access with Sodium Chloride 0.9% and Heparin 10 units/ml or 100 units/ml   Complete by: As directed    Home infusion instructions - [facility]   Complete by: As directed    Instructions: Flush IV access with Sodium Chloride 0.9% and Heparin 10units/ml or 100units/ml   Change dressing on IV access line: Weekly and PRN   Instructions Cath Flo 2mg: Ad76mister for PICC Line occlusion and as ordered by physician for other access device   [facility] pharmacist to adjust dose for: Vancomycin, Aminoglycosides and other anti-infective therapies as requested by physician   Method of administration may be changed at the discretion of home infusion  pharmacist based upon assessment of the patient and/or caregiver's ability to self-administer the medication ordered   Complete by: As directed      Allergies as of 06/10/2020      Reactions   Kaopectate  [attapulgite] Itching   Thiopental Itching   Tomato       Medication List    STOP taking these medications   acetaminophen 325 MG tablet Commonly known as: TYLENOL   aspirin 81 MG EC tablet   b complex-C-E-zinc tablet   carbamazepine 100 MG 12 hr tablet Commonly known as: TEGRETOL XR Replaced by: carbamazepine 100 MG chewable tablet   cetirizine 10 MG tablet Commonly known as: ZYRTEC   cyanocobalamin 50 MCG tablet   famotidine 20 MG tablet Commonly known as: PEPCID   ferrous sulfate 325 (65 FE) MG tablet   folic acid 1 MG tablet Commonly known as: FOLVITE   loratadine 10 MG tablet Commonly known as: CLARITIN   midodrine 5 MG tablet Commonly known as: PROAMATINE   MULTIPLE VITAMIN PO   Neomy-Bacit-Polymyx-Pramoxine 1 % Oint   oxyCODONE 5 MG immediate release tablet Commonly known as: Oxy IR/ROXICODONE   polyethylene glycol powder 17 GM/SCOOP powder Commonly known as: GLYCOLAX/MIRALAX   Polyvinyl Alcohol-Povidone PF 1.4-0.6 % Soln   Santyl ointment Generic drug: collagenase   tamsulosin 0.4 MG Caps capsule Commonly known as: FLOMAX   traMADol 50 MG tablet Commonly known as: ULTRAM     TAKE these medications   Ampicillin-Sulbactam 3 g in sodium chloride 0.9 % 100 mL Inject 3 g into the vein every 6 (six) hours.   carbamazepine 100 MG chewable tablet Commonly known as: TEGRETOL Place 0.5 tablets (50 mg total) into  feeding tube 4 (four) times daily. Replaces: carbamazepine 100 MG 12 hr tablet   levothyroxine 88 MCG tablet Commonly known as: SYNTHROID Take 1 tablet (88 mcg total) by mouth daily at 6 (six) AM. Start taking on: June 11, 2020 What changed:   medication strength  how much to take  when to take this             Discharge Care Instructions  (From admission, onward)         Start     Ordered   06/10/20 0000  Change dressing on IV access line weekly and PRN  (Home infusion instructions - [facility] )        06/10/20 1211           Signed: Maretta Bees Ranier Coach 06/10/2020, 12:11 PM

## 2020-06-10 NOTE — Plan of Care (Signed)
  Problem: Education: Goal: Knowledge of General Education information will improve Description: Including pain rating scale, medication(s)/side effects and non-pharmacologic comfort measures 06/10/2020 1453 by Manasseh Pittsley, Dewitt Rota, RN Outcome: Adequate for Discharge 06/10/2020 1246 by Clista Rainford, Dewitt Rota, RN Outcome: Adequate for Discharge   Problem: Health Behavior/Discharge Planning: Goal: Ability to manage health-related needs will improve 06/10/2020 1453 by Trafton Roker, Dewitt Rota, RN Outcome: Adequate for Discharge 06/10/2020 1246 by Marrah Vanevery, Dewitt Rota, RN Outcome: Adequate for Discharge   Problem: Clinical Measurements: Goal: Ability to maintain clinical measurements within normal limits will improve 06/10/2020 1453 by Crisanto Nied, Dewitt Rota, RN Outcome: Adequate for Discharge 06/10/2020 1246 by Sumedha Munnerlyn, Dewitt Rota, RN Outcome: Adequate for Discharge Goal: Will remain free from infection 06/10/2020 1453 by Shyan Scalisi, Dewitt Rota, RN Outcome: Adequate for Discharge 06/10/2020 1246 by Jenaro Souder, Dewitt Rota, RN Outcome: Adequate for Discharge Goal: Diagnostic test results will improve 06/10/2020 1453 by Lameshia Hypolite, Dewitt Rota, RN Outcome: Adequate for Discharge 06/10/2020 1246 by Kinsler Soeder, Dewitt Rota, RN Outcome: Adequate for Discharge Goal: Respiratory complications will improve 06/10/2020 1453 by Shaquanta Harkless, Dewitt Rota, RN Outcome: Adequate for Discharge 06/10/2020 1246 by Otha Rickles, Dewitt Rota, RN Outcome: Adequate for Discharge Goal: Cardiovascular complication will be avoided 06/10/2020 1453 by Favor Hackler, Dewitt Rota, RN Outcome: Adequate for Discharge 06/10/2020 1246 by Jenavive Lamboy, Dewitt Rota, RN Outcome: Adequate for Discharge   Problem: Activity: Goal: Risk for activity intolerance will decrease 06/10/2020 1453 by Vernessa Likes, Dewitt Rota, RN Outcome: Adequate for Discharge 06/10/2020 1246 by Samiha Denapoli, Dewitt Rota, RN Outcome: Adequate for Discharge   Problem: Nutrition: Goal: Adequate nutrition will be maintained 06/10/2020 1453 by Maelin Kurkowski, Dewitt Rota, RN Outcome:  Adequate for Discharge 06/10/2020 1246 by Ramari Bray, Dewitt Rota, RN Outcome: Adequate for Discharge   Problem: Coping: Goal: Level of anxiety will decrease 06/10/2020 1453 by Zyion Leidner, Dewitt Rota, RN Outcome: Adequate for Discharge 06/10/2020 1246 by Chastelyn Athens, Dewitt Rota, RN Outcome: Adequate for Discharge   Problem: Elimination: Goal: Will not experience complications related to bowel motility 06/10/2020 1453 by Anees Vanecek, Dewitt Rota, RN Outcome: Adequate for Discharge 06/10/2020 1246 by Samoria Fedorko, Dewitt Rota, RN Outcome: Adequate for Discharge Goal: Will not experience complications related to urinary retention 06/10/2020 1453 by Alexandria Current, Dewitt Rota, RN Outcome: Adequate for Discharge 06/10/2020 1246 by Raushanah Osmundson, Dewitt Rota, RN Outcome: Adequate for Discharge   Problem: Pain Managment: Goal: General experience of comfort will improve 06/10/2020 1453 by Jere Vanburen, Dewitt Rota, RN Outcome: Adequate for Discharge 06/10/2020 1246 by Khadeeja Elden, Dewitt Rota, RN Outcome: Adequate for Discharge   Problem: Safety: Goal: Ability to remain free from injury will improve 06/10/2020 1453 by Avir Deruiter, Dewitt Rota, RN Outcome: Adequate for Discharge 06/10/2020 1246 by Eliese Kerwood, Dewitt Rota, RN Outcome: Adequate for Discharge   Problem: Skin Integrity: Goal: Risk for impaired skin integrity will decrease 06/10/2020 1453 by Jackline Castilla, Dewitt Rota, RN Outcome: Adequate for Discharge 06/10/2020 1246 by Daelan Gatt, Dewitt Rota, RN Outcome: Adequate for Discharge

## 2020-06-10 NOTE — Progress Notes (Deleted)
Pharmacy - Antimicrobial Stewardship  Patient to discharge to [facility] today.  Discussed with Dr. Rivka Safer with infectious diseases to get recommendation for antibiotic choice and duration at discharge.    Plan Augmentin per tube x 14 days  Juliette Alcide, PharmD, BCPS.   Work Cell: [phone] 06/10/2020 2:22 PM

## 2020-06-10 NOTE — Progress Notes (Signed)
Impression/recommendation Septic shock due to Proteus bacteremiaand peptostreptococcusresolved Source Stage IV sacral decubituswith osteomyelitis of the underlying bones whichhasgotten worse since last 2 months. Currently on unasyn ( after getting 2 weeks of zosyn) and leucocytosis has resolved  There is a 2nd wound in the left mid back that may need furher debridement by surgery The sacral wound is being contaminated with stool  Antibiotic by itself is not going to help heal the wound- it has treated the septic shock and bacteremia A diverting colostomy, surgical debridement with wound vac improving nutrition, decreasing  the time she spends on her back will help to heal along with antibiotic- she has completed 19 days of Iv antibiotic- switch to Po augmentin for 2-3 weeks more .  Anemia- has received PRBC    Hypothyroidism on thyroxine.  AKI:  resolved  History of traumatic brain injury- quadriparesis  History of Streptococcus pyogenes and E. coli bacteremia and was treated appropriately with IV antibiotics.  Discussed the management with pharmacist ID will sign off

## 2020-06-10 NOTE — Progress Notes (Signed)
Pharmacy Electrolyte Monitoring Consult:  Pharmacy consulted to assist in monitoring and replacing electrolytes in this 49 y.o. female admitted on 05/15/2020  Labs:  Sodium (mmol/L)  Date Value  06/10/2020 140   Potassium (mmol/L)  Date Value  06/10/2020 3.8   Magnesium (mg/dL)  Date Value  05/39/7673 2.2   Phosphorus (mg/dL)  Date Value  41/93/7902 3.0   Calcium (mg/dL)  Date Value  40/97/3532 7.8 (L)   Albumin (g/dL)  Date Value  99/24/2683 1.5 (L)   Corrected Ca: 9.36 mg/dL  49 year old female with h/o TBI, seizures, hypothyroidism, diabetes. Patient admitted to ICU and subsequently transferred to the floor. Rapid response 7/29 for hypoxia and patient readmitted to the ICU. Blood cultures with peptostreptococcus and proteus. 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 She is receiving tube feedings at 55 mL/hr currently  Goal of Therapy:  Potassium 4.0 - 5.1 mmol/L Magnesium 2.0 - 2.4 mg/dL Electrolytes WNL  Plan: Electrolytes remain stable. Tolerating tube feeds. Will plan to follow approximately once weekly or as clinically indicated/ordered by physician.  Pricilla Riffle, PharmD 06/10/2020 11:40 AM

## 2020-06-11 DIAGNOSIS — S06301A Unspecified focal traumatic brain injury with loss of consciousness of 30 minutes or less, initial encounter: Secondary | ICD-10-CM

## 2020-06-11 DIAGNOSIS — J690 Pneumonitis due to inhalation of food and vomit: Secondary | ICD-10-CM

## 2020-06-11 DIAGNOSIS — I5042 Chronic combined systolic (congestive) and diastolic (congestive) heart failure: Secondary | ICD-10-CM

## 2020-06-11 DIAGNOSIS — J9621 Acute and chronic respiratory failure with hypoxia: Secondary | ICD-10-CM

## 2020-06-11 LAB — CULTURE, BLOOD (ROUTINE X 2)
Culture: NO GROWTH
Special Requests: ADEQUATE

## 2020-06-11 LAB — CULTURE, RESPIRATORY W GRAM STAIN

## 2020-06-12 DIAGNOSIS — I5042 Chronic combined systolic (congestive) and diastolic (congestive) heart failure: Secondary | ICD-10-CM

## 2020-06-12 DIAGNOSIS — S06301A Unspecified focal traumatic brain injury with loss of consciousness of 30 minutes or less, initial encounter: Secondary | ICD-10-CM

## 2020-06-12 DIAGNOSIS — J9621 Acute and chronic respiratory failure with hypoxia: Secondary | ICD-10-CM

## 2020-06-12 DIAGNOSIS — J690 Pneumonitis due to inhalation of food and vomit: Secondary | ICD-10-CM

## 2020-06-13 DIAGNOSIS — S06301A Unspecified focal traumatic brain injury with loss of consciousness of 30 minutes or less, initial encounter: Secondary | ICD-10-CM

## 2020-06-13 DIAGNOSIS — J690 Pneumonitis due to inhalation of food and vomit: Secondary | ICD-10-CM

## 2020-06-13 DIAGNOSIS — J9621 Acute and chronic respiratory failure with hypoxia: Secondary | ICD-10-CM

## 2020-06-13 DIAGNOSIS — I5042 Chronic combined systolic (congestive) and diastolic (congestive) heart failure: Secondary | ICD-10-CM

## 2020-06-21 DIAGNOSIS — I5042 Chronic combined systolic (congestive) and diastolic (congestive) heart failure: Secondary | ICD-10-CM

## 2020-06-21 DIAGNOSIS — S06301A Unspecified focal traumatic brain injury with loss of consciousness of 30 minutes or less, initial encounter: Secondary | ICD-10-CM

## 2020-06-21 DIAGNOSIS — J9621 Acute and chronic respiratory failure with hypoxia: Secondary | ICD-10-CM

## 2020-06-21 DIAGNOSIS — J690 Pneumonitis due to inhalation of food and vomit: Secondary | ICD-10-CM

## 2020-06-21 MED FILL — Furosemide Inj 10 MG/ML: INTRAMUSCULAR | Qty: 6 | Status: AC

## 2020-06-22 DIAGNOSIS — J690 Pneumonitis due to inhalation of food and vomit: Secondary | ICD-10-CM

## 2020-06-22 DIAGNOSIS — I5042 Chronic combined systolic (congestive) and diastolic (congestive) heart failure: Secondary | ICD-10-CM

## 2020-06-22 DIAGNOSIS — J9621 Acute and chronic respiratory failure with hypoxia: Secondary | ICD-10-CM

## 2020-06-22 DIAGNOSIS — S06301A Unspecified focal traumatic brain injury with loss of consciousness of 30 minutes or less, initial encounter: Secondary | ICD-10-CM

## 2020-06-23 DIAGNOSIS — S06301A Unspecified focal traumatic brain injury with loss of consciousness of 30 minutes or less, initial encounter: Secondary | ICD-10-CM

## 2020-06-23 DIAGNOSIS — J690 Pneumonitis due to inhalation of food and vomit: Secondary | ICD-10-CM

## 2020-06-23 DIAGNOSIS — J9621 Acute and chronic respiratory failure with hypoxia: Secondary | ICD-10-CM

## 2020-06-23 DIAGNOSIS — I5042 Chronic combined systolic (congestive) and diastolic (congestive) heart failure: Secondary | ICD-10-CM

## 2020-06-24 DIAGNOSIS — J9621 Acute and chronic respiratory failure with hypoxia: Secondary | ICD-10-CM

## 2020-06-24 DIAGNOSIS — I5042 Chronic combined systolic (congestive) and diastolic (congestive) heart failure: Secondary | ICD-10-CM

## 2020-06-24 DIAGNOSIS — S06301A Unspecified focal traumatic brain injury with loss of consciousness of 30 minutes or less, initial encounter: Secondary | ICD-10-CM

## 2020-06-24 DIAGNOSIS — J690 Pneumonitis due to inhalation of food and vomit: Secondary | ICD-10-CM

## 2020-06-25 DIAGNOSIS — S06301A Unspecified focal traumatic brain injury with loss of consciousness of 30 minutes or less, initial encounter: Secondary | ICD-10-CM

## 2020-06-25 DIAGNOSIS — J9621 Acute and chronic respiratory failure with hypoxia: Secondary | ICD-10-CM

## 2020-06-25 DIAGNOSIS — J90 Pleural effusion, not elsewhere classified: Secondary | ICD-10-CM

## 2020-06-25 DIAGNOSIS — I5042 Chronic combined systolic (congestive) and diastolic (congestive) heart failure: Secondary | ICD-10-CM

## 2020-06-26 DIAGNOSIS — S06301A Unspecified focal traumatic brain injury with loss of consciousness of 30 minutes or less, initial encounter: Secondary | ICD-10-CM

## 2020-06-26 DIAGNOSIS — I5042 Chronic combined systolic (congestive) and diastolic (congestive) heart failure: Secondary | ICD-10-CM

## 2020-06-26 DIAGNOSIS — J9621 Acute and chronic respiratory failure with hypoxia: Secondary | ICD-10-CM

## 2020-06-26 DIAGNOSIS — J690 Pneumonitis due to inhalation of food and vomit: Secondary | ICD-10-CM

## 2020-06-27 DIAGNOSIS — S06301A Unspecified focal traumatic brain injury with loss of consciousness of 30 minutes or less, initial encounter: Secondary | ICD-10-CM

## 2020-06-27 DIAGNOSIS — I5042 Chronic combined systolic (congestive) and diastolic (congestive) heart failure: Secondary | ICD-10-CM

## 2020-06-27 DIAGNOSIS — J9621 Acute and chronic respiratory failure with hypoxia: Secondary | ICD-10-CM

## 2020-06-27 DIAGNOSIS — J690 Pneumonitis due to inhalation of food and vomit: Secondary | ICD-10-CM

## 2020-07-05 DIAGNOSIS — I5042 Chronic combined systolic (congestive) and diastolic (congestive) heart failure: Secondary | ICD-10-CM

## 2020-07-05 DIAGNOSIS — J9621 Acute and chronic respiratory failure with hypoxia: Secondary | ICD-10-CM

## 2020-07-05 DIAGNOSIS — J690 Pneumonitis due to inhalation of food and vomit: Secondary | ICD-10-CM

## 2020-07-05 DIAGNOSIS — S06301A Unspecified focal traumatic brain injury with loss of consciousness of 30 minutes or less, initial encounter: Secondary | ICD-10-CM

## 2020-07-06 DIAGNOSIS — I5042 Chronic combined systolic (congestive) and diastolic (congestive) heart failure: Secondary | ICD-10-CM

## 2020-07-06 DIAGNOSIS — J9621 Acute and chronic respiratory failure with hypoxia: Secondary | ICD-10-CM

## 2020-07-06 DIAGNOSIS — J690 Pneumonitis due to inhalation of food and vomit: Secondary | ICD-10-CM

## 2020-07-06 DIAGNOSIS — S06301A Unspecified focal traumatic brain injury with loss of consciousness of 30 minutes or less, initial encounter: Secondary | ICD-10-CM

## 2020-07-07 DIAGNOSIS — J690 Pneumonitis due to inhalation of food and vomit: Secondary | ICD-10-CM

## 2020-07-07 DIAGNOSIS — J9621 Acute and chronic respiratory failure with hypoxia: Secondary | ICD-10-CM

## 2020-07-07 DIAGNOSIS — S06301A Unspecified focal traumatic brain injury with loss of consciousness of 30 minutes or less, initial encounter: Secondary | ICD-10-CM

## 2020-07-07 DIAGNOSIS — I5042 Chronic combined systolic (congestive) and diastolic (congestive) heart failure: Secondary | ICD-10-CM

## 2020-07-08 DIAGNOSIS — S06301A Unspecified focal traumatic brain injury with loss of consciousness of 30 minutes or less, initial encounter: Secondary | ICD-10-CM

## 2020-07-08 DIAGNOSIS — J690 Pneumonitis due to inhalation of food and vomit: Secondary | ICD-10-CM

## 2020-07-08 DIAGNOSIS — I5042 Chronic combined systolic (congestive) and diastolic (congestive) heart failure: Secondary | ICD-10-CM

## 2020-07-08 DIAGNOSIS — J9621 Acute and chronic respiratory failure with hypoxia: Secondary | ICD-10-CM

## 2020-07-09 DIAGNOSIS — S06301A Unspecified focal traumatic brain injury with loss of consciousness of 30 minutes or less, initial encounter: Secondary | ICD-10-CM

## 2020-07-09 DIAGNOSIS — I5042 Chronic combined systolic (congestive) and diastolic (congestive) heart failure: Secondary | ICD-10-CM

## 2020-07-09 DIAGNOSIS — J9621 Acute and chronic respiratory failure with hypoxia: Secondary | ICD-10-CM

## 2020-07-09 DIAGNOSIS — J690 Pneumonitis due to inhalation of food and vomit: Secondary | ICD-10-CM

## 2020-07-10 DIAGNOSIS — I5042 Chronic combined systolic (congestive) and diastolic (congestive) heart failure: Secondary | ICD-10-CM

## 2020-07-10 DIAGNOSIS — S06301A Unspecified focal traumatic brain injury with loss of consciousness of 30 minutes or less, initial encounter: Secondary | ICD-10-CM

## 2020-07-10 DIAGNOSIS — J9621 Acute and chronic respiratory failure with hypoxia: Secondary | ICD-10-CM

## 2020-07-10 DIAGNOSIS — J690 Pneumonitis due to inhalation of food and vomit: Secondary | ICD-10-CM

## 2020-07-11 DIAGNOSIS — J690 Pneumonitis due to inhalation of food and vomit: Secondary | ICD-10-CM

## 2020-07-11 DIAGNOSIS — J9621 Acute and chronic respiratory failure with hypoxia: Secondary | ICD-10-CM

## 2020-07-11 DIAGNOSIS — S06301A Unspecified focal traumatic brain injury with loss of consciousness of 30 minutes or less, initial encounter: Secondary | ICD-10-CM

## 2020-07-11 DIAGNOSIS — I5042 Chronic combined systolic (congestive) and diastolic (congestive) heart failure: Secondary | ICD-10-CM

## 2020-08-13 ENCOUNTER — Emergency Department: Payer: Medicare PPO

## 2020-08-13 ENCOUNTER — Other Ambulatory Visit: Payer: Self-pay

## 2020-08-13 ENCOUNTER — Emergency Department
Admission: EM | Admit: 2020-08-13 | Discharge: 2020-08-13 | Disposition: A | Discharge status: Skilled Nursing Facility | Payer: Medicare PPO | Attending: Emergency Medicine | Admitting: Emergency Medicine

## 2020-08-13 ENCOUNTER — Encounter: Payer: Self-pay | Admitting: Emergency Medicine

## 2020-08-13 DIAGNOSIS — K9423 Gastrostomy malfunction: Secondary | ICD-10-CM | POA: Diagnosis present

## 2020-08-13 DIAGNOSIS — Y733 Surgical instruments, materials and gastroenterology and urology devices (including sutures) associated with adverse incidents: Secondary | ICD-10-CM | POA: Insufficient documentation

## 2020-08-13 DIAGNOSIS — T85528A Displacement of other gastrointestinal prosthetic devices, implants and grafts, initial encounter: Secondary | ICD-10-CM | POA: Insufficient documentation

## 2020-08-13 DIAGNOSIS — I110 Hypertensive heart disease with heart failure: Secondary | ICD-10-CM | POA: Diagnosis not present

## 2020-08-13 DIAGNOSIS — Z79899 Other long term (current) drug therapy: Secondary | ICD-10-CM | POA: Diagnosis not present

## 2020-08-13 DIAGNOSIS — I5043 Acute on chronic combined systolic (congestive) and diastolic (congestive) heart failure: Secondary | ICD-10-CM | POA: Insufficient documentation

## 2020-08-13 DIAGNOSIS — E039 Hypothyroidism, unspecified: Secondary | ICD-10-CM | POA: Insufficient documentation

## 2020-08-13 DIAGNOSIS — E119 Type 2 diabetes mellitus without complications: Secondary | ICD-10-CM | POA: Diagnosis not present

## 2020-08-13 MED ORDER — DIATRIZOATE MEGLUMINE & SODIUM 66-10 % PO SOLN
30.0000 mL | Freq: Once | ORAL | Status: AC
  Administered 2020-08-13: 30 mL via ORAL

## 2020-08-13 NOTE — ED Triage Notes (Signed)
Pt to ED via [facility] from [facility] care. Pts G tube has been displaced since last night. Pt is currently A & O

## 2020-08-13 NOTE — Discharge Instructions (Addendum)
Patient's G-tube is noted to be in appropriate positioning on imaging with contrast.  Return to the ED with any additional acute concerns

## 2020-08-13 NOTE — ED Provider Notes (Signed)
[facility] Emergency Department Provider Note ____________________________________________   First MD Initiated Contact with Patient 08/13/20 1220     (approximate)  I have reviewed the triage vital signs and the nursing notes.  HISTORY  Chief Complaint G tube displacement   HPI Joanne [name] is a 49 y.o. femalewho presents to the ED for evaluation of dislodged G tube.   Chart review indicates prolonged admission from 7/24-8/19 for septic shock and associated complications.  Prior history of TBI, trach dependent and known seizure disorder.  Discharged to an LTAC.  Patient presents to the ED via EMS from her SNF for evaluation of possible PEG tube dislodgment.  SNF reports concerns that patient's PEG tube "moved some" as they were changing the dressing around its base and bumper.  They sent the patient to the ED for further evaluation of this tube.  Patient has no complaints of abdominal pain, vomiting.  History limited by her baseline TBI and impaired mental status.    Past Medical History:  Diagnosis Date  . Acute kidney injury (AKI) with acute tubular necrosis (ATN) (HCC)   . Acute on chronic combined systolic and diastolic CHF (congestive heart failure) (HCC) 06/06/2018  . Acute on chronic respiratory failure with hypoxia (HCC) 06/06/2018  . ARDS (adult respiratory distress syndrome) (HCC) 06/06/2018  . Aspiration pneumonia due to gastric secretions (HCC)   . Diabetes mellitus (HCC)   . History of traumatic brain injury   . Hypothyroidism   . Pneumonia due to Klebsiella pneumoniae (HCC) 06/06/2018  . Seizure disorder (HCC)   . Severe sepsis (HCC) 06/06/2018  . 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 Traumatic brain injury [facility])     Patient Active Problem List   Diagnosis Date Noted  . Mild malnutrition (HCC)   . Protein-calorie malnutrition, severe 05/20/2020  . Weakness   . Full code status   . Hypokalemia 05/16/2020  . Severe sepsis with  septic shock (HCC) 05/15/2020  . Unstageable pressure ulcer of sacral region (HCC) 03/10/2020  . Goals of care, counseling/discussion   . Palliative care by specialist   . DNR (do not resuscitate) discussion   . Septic shock (HCC) 03/02/2020  . Hypernatremia 11/15/2019  . Thrombocytopenia (HCC) 11/15/2019  . AKI (acute kidney injury) (HCC) 11/15/2019  . HTN (hypertension) 11/15/2019  . Non-insulin dependent type 2 diabetes mellitus (HCC) 11/15/2019  . Hypothyroidism 11/15/2019  . Severe sepsis with acute organ dysfunction (HCC) 11/15/2019  . Left hemiplegia (HCC) 11/15/2019  . Pressure injury of skin 11/15/2019  . Aspiration pneumonia due to gastric secretions (HCC)   . Acute kidney injury (AKI) with acute tubular necrosis (ATN) (HCC)   . Traumatic brain injury (HCC)   . Acute on chronic respiratory failure with hypoxia (HCC) 06/06/2018  . 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 Pneumonia due to Klebsiella pneumoniae (HCC) 06/06/2018  . Acute on chronic combined systolic and diastolic CHF (congestive heart failure) (HCC) 06/06/2018  . Sepsis secondary to UTI (HCC) 06/06/2018  . ARDS (adult respiratory distress syndrome) (HCC) 06/06/2018  . Acute on chronic respiratory failure with hypoxia (HCC) 06/06/2018  . 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    Past Surgical History:  Procedure Laterality Date  . PEG PLACEMENT N/A 05/27/2020   Procedure: PERCUTANEOUS ENDOSCOPIC GASTROSTOMY (PEG) PLACEMENT;  Surgeon: Midge Minium, MD;  Location: [facility] ENDOSCOPY;  Service: Endoscopy;  Laterality: N/A;  . TRACHEOSTOMY      Prior to Admission medications   Medication Sig Start Date End Date Taking? Authorizing  Provider  amoxicillin-clavulanate (AUGMENTIN) 250-62.5 MG/5ML suspension Place 10 mLs (500 mg total) into feeding tube every 8 (eight) hours. 06/10/20   Erin Fulling, MD  carbamazepine (TEGRETOL) 100 MG chewable tablet Place 0.5 tablets (50 mg total) into feeding tube 4 (four) times daily.  06/10/20   Erin Fulling, MD  levothyroxine (SYNTHROID) 88 MCG tablet Take 1 tablet (88 mcg total) by mouth daily at 6 (six) AM. 06/11/20   Erin Fulling, MD    Allergies Kaopectate  [attapulgite], Thiopental, and Tomato  Family History  Family history unknown: Yes    Social History Social History   Tobacco Use  . Smoking status: Never Smoker  . Smokeless tobacco: Never Used  Substance Use Topics  . Alcohol use: Not Currently  . Drug use: Not Currently    Review of Systems  Difficult to be accurately assessed due to patient's baseline depressed mental status s/p TBI  ____________________________________________   PHYSICAL EXAM:  VITAL SIGNS: Vitals:   08/13/20 1228 08/13/20 1402  BP: 101/70 101/77  Pulse: 91 88  Resp: 16 18  Temp:  98.6 F (37 C)  SpO2: 92% 99%     Constitutional: Alert and oriented. Well appearing and in no acute distress. Eyes: Conjunctivae are normal. PERRL. EOMI. Head: Atraumatic. Nose: No congestion/rhinnorhea. Mouth/Throat: Mucous membranes are moist.  Oropharynx non-erythematous. Neck: No stridor. No cervical spine tenderness to palpation. Cardiovascular: Normal rate, regular rhythm. Grossly normal heart sounds.  Good peripheral circulation. Respiratory: Normal respiratory effort.  No retractions. Lungs CTAB. Gastrointestinal: Soft , nondistended, nontender to palpation. No abdominal bruits.  Multiple well-healed midline surgical incisions. PEG tube extending from the right mid abdomen in place without surrounding erythema or purulence. Musculoskeletal: No lower extremity tenderness nor edema.  No joint effusions. No signs of acute trauma. Neurologic: No gross focal neurologic deficits are appreciated.  Skin:  Skin is warm, dry and intact. No rash noted. Psychiatric: Mood and affect are normal. Speech and behavior are normal.  ____________________________________________  RADIOLOGY  ED MD interpretation: 1 view abdomen x-ray with  evidence of contrast filling the stomach without evidence of extravasation to suggest dislodgment.  Official radiology report(s): DG ABDOMEN PEG TUBE LOCATION  Result Date: 08/13/2020 CLINICAL DATA:  Check G-tube position. EXAM: ABDOMEN - 1 VIEW COMPARISON:  None. FINDINGS: 30 cc Gastrografin injected into the antral tube. Contrast is visible in the gastric antrum, passing into the duodenum. No sign of peritoneal spillage. IMPRESSION: G-tube in the stomach without evidence of gastric outlet obstruction or intraperitoneal spillage. Electronically Signed   By: Paulina Fusi M.D.   On: 08/13/2020 13:32    ____________________________________________   PROCEDURES and INTERVENTIONS  Procedure(s) performed (including Critical Care):  Procedures  Medications  diatrizoate meglumine-sodium (GASTROGRAFIN) 66-10 % solution 30 mL (30 mLs Oral Given 08/13/20 1327)    ____________________________________________   MDM / ED COURSE  49 year old PEG tube dependent patient presents to the ED due to SNF concerns for dislodged PEG tube, without evidence of such, and amenable to return to facility.  Normal vital signs on room air.  Exam with the patient in no acute distress and has a benign abdomen.  PEG tube appears in place externally.  Confirmation with contrast-assisted radiography.  No indications for further work-up in the ED at this time.  Will return to facility.      ____________________________________________   FINAL CLINICAL IMPRESSION(S) / ED DIAGNOSES  Final diagnoses:  Dislodged gastrostomy tube  PEG tube malfunction [facility])     ED Discharge Orders  None       Delton Prairie   Note:  This document was prepared using Conservation officer, historic buildings and may include unintentional dictation errors.   Delton [place]irie, MD 08/13/20 [id number]

## 2020-08-13 NOTE — ED Notes (Signed)
Reports given to "Mrs Willeen Cass" at [employer]

## 2020-08-13 NOTE — ED Notes (Signed)
Pt resting at this time, NAD noted, RR even and unlabored.

## 2020-08-13 NOTE — ED Notes (Signed)
Pt's legal guardian [name] updated regarding discharge.

## 2020-08-13 NOTE — ED Notes (Signed)
[facility] called for transport back to [facility] 

## 2020-09-06 ENCOUNTER — Other Ambulatory Visit: Payer: Self-pay

## 2020-09-06 DIAGNOSIS — E559 Vitamin D deficiency, unspecified: Secondary | ICD-10-CM | POA: Insufficient documentation

## 2020-09-06 DIAGNOSIS — S0990XA Unspecified injury of head, initial encounter: Secondary | ICD-10-CM | POA: Insufficient documentation

## 2020-09-06 DIAGNOSIS — G40909 Epilepsy, unspecified, not intractable, without status epilepticus: Secondary | ICD-10-CM | POA: Insufficient documentation

## 2020-09-07 ENCOUNTER — Ambulatory Visit: Payer: Medicare PPO | Admitting: Gastroenterology

## 2020-09-09 ENCOUNTER — Encounter: Payer: Self-pay | Admitting: Gastroenterology

## 2020-09-09 ENCOUNTER — Other Ambulatory Visit: Payer: Self-pay

## 2020-09-09 ENCOUNTER — Ambulatory Visit (INDEPENDENT_AMBULATORY_CARE_PROVIDER_SITE_OTHER): Payer: Medicare PPO | Admitting: Gastroenterology

## 2020-09-09 VITALS — BP 102/62 | HR 89 | Ht 64.0 in

## 2020-09-09 DIAGNOSIS — R1319 Other dysphagia: Secondary | ICD-10-CM | POA: Diagnosis not present

## 2020-09-09 NOTE — Progress Notes (Signed)
Primary Care Physician: Crist Fat, MD  Primary Gastroenterologist:  Dr. Midge Minium  Chief Complaint  Patient presents with  . New Patient (Initial Visit)    Peg tube removal    HPI: Joanne [name] is a 49 y.o. female here to have her feeding tube removed.  The patient states she is no longer needing to use the feeding tube and is eating by mouth. The patient had a feeding tube placed by interventional radiology at an off Assigned when the patient was set up to have the tube removed the local radiologist stated that they did not want to take it out because was put in some rales.  I was then called to evaluate the patient to see if I could remove it.  The patient reports that she has a lot of pain around the PEG site and she has a lot of pain due to decubitus ulcers.  Past Medical History:  Diagnosis Date  . Acute kidney injury (AKI) with acute tubular necrosis (ATN) (HCC)   . Acute on chronic combined systolic and diastolic CHF (congestive heart failure) (HCC) 06/06/2018  . Acute on chronic respiratory failure with hypoxia (HCC) 06/06/2018  . ARDS (adult respiratory distress syndrome) (HCC) 06/06/2018  . Aspiration pneumonia due to gastric secretions (HCC)   . Diabetes mellitus (HCC)   . History of traumatic brain injury   . Hypothyroidism   . Pneumonia due to Klebsiella pneumoniae (HCC) 06/06/2018  . Seizure disorder (HCC)   . Severe sepsis (HCC) 06/06/2018  . 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 Traumatic brain injury [facility])     Current Outpatient Medications  Medication Sig Dispense Refill  . baclofen (LIORESAL) 10 MG tablet Take 10 mg by mouth 3 (three) times daily.    . carbamazepine (TEGRETOL) 100 MG chewable tablet Place 0.5 tablets (50 mg total) into feeding tube 4 (four) times daily. 60 tablet 0  . gabapentin (NEURONTIN) 100 MG capsule Take 1 capsule by mouth daily.    Marland Kitchen levothyroxine (SYNTHROID) 88 MCG tablet Take 1 tablet (88 mcg total) by mouth daily at 6 (six)  AM.    . oxyCODONE (OXY IR/ROXICODONE) 5 MG immediate release tablet Take 5 mg by mouth 3 (three) times daily as needed.    Marland Kitchen amoxicillin-clavulanate (AUGMENTIN) 250-62.5 MG/5ML suspension Place 10 mLs (500 mg total) into feeding tube every 8 (eight) hours. (Patient not taking: Reported on 09/09/2020) 150 mL 0   No current facility-administered medications for this visit.    Allergies as of 09/09/2020 - Review Complete 09/09/2020  Allergen Reaction Noted  . Kaopectate  [attapulgite] Itching 10/19/2012  . Thiopental Itching 10/19/2012  . Gatifloxacin  08/27/2018  . Ceftin [cefuroxime]  09/06/2020  . Tomato  11/14/2019    ROS:  General: Negative for anorexia, weight loss, fever, chills, fatigue, weakness. ENT: Negative for hoarseness, difficulty swallowing , nasal congestion. CV: Negative for chest pain, angina, palpitations, dyspnea on exertion, peripheral edema.  Respiratory: Negative for dyspnea at rest, dyspnea on exertion, cough, sputum, wheezing.  GI: See history of present illness. GU:  Negative for dysuria, hematuria, urinary incontinence, urinary frequency, nocturnal urination.  Endo: Negative for unusual weight change.    Physical Examination:   BP 102/62 Comment: Taken by EMS on 09/09/20 at 2:44pm  Pulse 89 Comment: Taken by EMS on 09/09/20 at 2:44pm  Ht 5\' 4"  (1.626 m)   LMP  (LMP Unknown)   BMI 31.07 kg/m   General: Well-nourished, well-developed in no acute distress.  Eyes: No icterus. Conjunctivae pink. Abdomen: PEG tube very tender to touch without any sign of infection or induration.  There is no balloon to take down and remove at the bedside. Skin: Warm and dry, no jaundice.   Psych: Alert and cooperative, normal mood and affect.  Labs:    Imaging Studies: DG ABDOMEN PEG TUBE LOCATION  Result Date: 08/13/2020 CLINICAL DATA:  Check G-tube position. EXAM: ABDOMEN - 1 VIEW COMPARISON:  None. FINDINGS: 30 cc Gastrografin injected into the antral tube.  Contrast is visible in the gastric antrum, passing into the duodenum. No sign of peritoneal spillage. IMPRESSION: G-tube in the stomach without evidence of gastric outlet obstruction or intraperitoneal spillage. Electronically Signed   By: Paulina Fusi M.D.   On: 08/13/2020 13:32    Assessment and Plan:   Joanne [name] is a 49 y.o. y/o female This patient has a PEG tube placed by interventional radiology and like the PEG tube removed.  The patient came in for evaluation of the PEG tube which appears to be very tender to any palpation at the present time.  The patient will be set up for upper endoscopy with sedation and removal of the PEG tube at that time.  The patient has been explained the plan and will be set up to have be tube removed endoscopically.     Midge Minium, MD. Clementeen Graham    Note: This dictation was prepared with Dragon dictation along with smaller phrase technology. Any transcriptional errors that result from this process are unintentional.

## 2020-09-10 ENCOUNTER — Other Ambulatory Visit: Payer: Self-pay

## 2020-09-10 DIAGNOSIS — K9423 Gastrostomy malfunction: Secondary | ICD-10-CM

## 2020-09-14 ENCOUNTER — Ambulatory Visit: Admission: RE | Admit: 2020-09-14 | Payer: Medicare PPO | Source: Home / Self Care | Admitting: Gastroenterology

## 2020-09-14 ENCOUNTER — Encounter: Admission: RE | Payer: Self-pay | Source: Home / Self Care

## 2020-09-14 SURGERY — ESOPHAGOGASTRODUODENOSCOPY (EGD) WITH PROPOFOL
Anesthesia: General

## 2021-04-24 ENCOUNTER — Inpatient Hospital Stay
Admission: EM | Admit: 2021-04-24 | Discharge: 2021-04-30 | DRG: 698 | Disposition: A | Discharge status: Skilled Nursing Facility | Payer: Medicare Other | Attending: Student | Admitting: Student

## 2021-04-24 ENCOUNTER — Emergency Department: Payer: Medicare Other

## 2021-04-24 ENCOUNTER — Other Ambulatory Visit: Payer: Self-pay

## 2021-04-24 ENCOUNTER — Encounter: Payer: Self-pay | Admitting: Emergency Medicine

## 2021-04-24 DIAGNOSIS — R6521 Severe sepsis with septic shock: Secondary | ICD-10-CM | POA: Diagnosis present

## 2021-04-24 DIAGNOSIS — G8929 Other chronic pain: Secondary | ICD-10-CM | POA: Diagnosis present

## 2021-04-24 DIAGNOSIS — L89154 Pressure ulcer of sacral region, stage 4: Secondary | ICD-10-CM | POA: Diagnosis present

## 2021-04-24 DIAGNOSIS — I5042 Chronic combined systolic (congestive) and diastolic (congestive) heart failure: Secondary | ICD-10-CM | POA: Diagnosis present

## 2021-04-24 DIAGNOSIS — Z79890 Hormone replacement therapy (postmenopausal): Secondary | ICD-10-CM

## 2021-04-24 DIAGNOSIS — K439 Ventral hernia without obstruction or gangrene: Secondary | ICD-10-CM | POA: Diagnosis present

## 2021-04-24 DIAGNOSIS — Z931 Gastrostomy status: Secondary | ICD-10-CM

## 2021-04-24 DIAGNOSIS — Z978 Presence of other specified devices: Secondary | ICD-10-CM

## 2021-04-24 DIAGNOSIS — S069X9A Unspecified intracranial injury with loss of consciousness of unspecified duration, initial encounter: Secondary | ICD-10-CM | POA: Diagnosis present

## 2021-04-24 DIAGNOSIS — Z6825 Body mass index (BMI) 25.0-25.9, adult: Secondary | ICD-10-CM

## 2021-04-24 DIAGNOSIS — Z881 Allergy status to other antibiotic agents status: Secondary | ICD-10-CM

## 2021-04-24 DIAGNOSIS — E039 Hypothyroidism, unspecified: Secondary | ICD-10-CM | POA: Diagnosis present

## 2021-04-24 DIAGNOSIS — Z7401 Bed confinement status: Secondary | ICD-10-CM

## 2021-04-24 DIAGNOSIS — T83511A Infection and inflammatory reaction due to indwelling urethral catheter, initial encounter: Secondary | ICD-10-CM | POA: Diagnosis present

## 2021-04-24 DIAGNOSIS — Z741 Need for assistance with personal care: Secondary | ICD-10-CM

## 2021-04-24 DIAGNOSIS — N179 Acute kidney failure, unspecified: Secondary | ICD-10-CM | POA: Diagnosis present

## 2021-04-24 DIAGNOSIS — E860 Dehydration: Secondary | ICD-10-CM | POA: Diagnosis present

## 2021-04-24 DIAGNOSIS — I110 Hypertensive heart disease with heart failure: Secondary | ICD-10-CM | POA: Diagnosis present

## 2021-04-24 DIAGNOSIS — Z7189 Other specified counseling: Secondary | ICD-10-CM | POA: Diagnosis not present

## 2021-04-24 DIAGNOSIS — G40909 Epilepsy, unspecified, not intractable, without status epilepticus: Secondary | ICD-10-CM | POA: Diagnosis present

## 2021-04-24 DIAGNOSIS — E43 Unspecified severe protein-calorie malnutrition: Secondary | ICD-10-CM | POA: Diagnosis present

## 2021-04-24 DIAGNOSIS — E11649 Type 2 diabetes mellitus with hypoglycemia without coma: Secondary | ICD-10-CM | POA: Diagnosis not present

## 2021-04-24 DIAGNOSIS — Z79899 Other long term (current) drug therapy: Secondary | ICD-10-CM

## 2021-04-24 DIAGNOSIS — R338 Other retention of urine: Secondary | ICD-10-CM | POA: Diagnosis present

## 2021-04-24 DIAGNOSIS — R4182 Altered mental status, unspecified: Secondary | ICD-10-CM | POA: Diagnosis present

## 2021-04-24 DIAGNOSIS — Z87820 Personal history of traumatic brain injury: Secondary | ICD-10-CM

## 2021-04-24 DIAGNOSIS — E861 Hypovolemia: Secondary | ICD-10-CM | POA: Diagnosis present

## 2021-04-24 DIAGNOSIS — N3090 Cystitis, unspecified without hematuria: Secondary | ICD-10-CM

## 2021-04-24 DIAGNOSIS — E1142 Type 2 diabetes mellitus with diabetic polyneuropathy: Secondary | ICD-10-CM | POA: Diagnosis present

## 2021-04-24 DIAGNOSIS — Z91018 Allergy to other foods: Secondary | ICD-10-CM

## 2021-04-24 DIAGNOSIS — G9341 Metabolic encephalopathy: Secondary | ICD-10-CM | POA: Diagnosis present

## 2021-04-24 DIAGNOSIS — R64 Cachexia: Secondary | ICD-10-CM | POA: Diagnosis present

## 2021-04-24 DIAGNOSIS — K9423 Gastrostomy malfunction: Secondary | ICD-10-CM

## 2021-04-24 DIAGNOSIS — E441 Mild protein-calorie malnutrition: Secondary | ICD-10-CM

## 2021-04-24 DIAGNOSIS — Z20822 Contact with and (suspected) exposure to covid-19: Secondary | ICD-10-CM | POA: Diagnosis present

## 2021-04-24 DIAGNOSIS — R627 Adult failure to thrive: Secondary | ICD-10-CM | POA: Diagnosis present

## 2021-04-24 DIAGNOSIS — R6520 Severe sepsis without septic shock: Secondary | ICD-10-CM | POA: Diagnosis not present

## 2021-04-24 DIAGNOSIS — N319 Neuromuscular dysfunction of bladder, unspecified: Secondary | ICD-10-CM | POA: Diagnosis present

## 2021-04-24 DIAGNOSIS — Z888 Allergy status to other drugs, medicaments and biological substances status: Secondary | ICD-10-CM

## 2021-04-24 DIAGNOSIS — R571 Hypovolemic shock: Secondary | ICD-10-CM | POA: Diagnosis present

## 2021-04-24 DIAGNOSIS — I509 Heart failure, unspecified: Secondary | ICD-10-CM

## 2021-04-24 DIAGNOSIS — S069XAA Unspecified intracranial injury with loss of consciousness status unknown, initial encounter: Secondary | ICD-10-CM | POA: Diagnosis present

## 2021-04-24 DIAGNOSIS — G8194 Hemiplegia, unspecified affecting left nondominant side: Secondary | ICD-10-CM

## 2021-04-24 DIAGNOSIS — Z515 Encounter for palliative care: Secondary | ICD-10-CM | POA: Diagnosis not present

## 2021-04-24 DIAGNOSIS — L8990 Pressure ulcer of unspecified site, unspecified stage: Secondary | ICD-10-CM | POA: Diagnosis present

## 2021-04-24 DIAGNOSIS — A419 Sepsis, unspecified organism: Secondary | ICD-10-CM | POA: Diagnosis present

## 2021-04-24 DIAGNOSIS — E119 Type 2 diabetes mellitus without complications: Secondary | ICD-10-CM

## 2021-04-24 LAB — CBC WITH DIFFERENTIAL/PLATELET
Abs Immature Granulocytes: 0.09 10*3/uL — ABNORMAL HIGH (ref 0.00–0.07)
Basophils Absolute: 0.1 10*3/uL (ref 0.0–0.1)
Basophils Relative: 0 %
Eosinophils Absolute: 0 10*3/uL (ref 0.0–0.5)
Eosinophils Relative: 0 %
HCT: 46.8 % — ABNORMAL HIGH (ref 36.0–46.0)
Hemoglobin: 14.8 g/dL (ref 12.0–15.0)
Immature Granulocytes: 1 %
Lymphocytes Relative: 8 %
Lymphs Abs: 1.5 10*3/uL (ref 0.7–4.0)
MCH: 28 pg (ref 26.0–34.0)
MCHC: 31.6 g/dL (ref 30.0–36.0)
MCV: 88.6 fL (ref 80.0–100.0)
Monocytes Absolute: 1 10*3/uL (ref 0.1–1.0)
Monocytes Relative: 6 %
Neutro Abs: 15.6 10*3/uL — ABNORMAL HIGH (ref 1.7–7.7)
Neutrophils Relative %: 85 %
Platelets: 286 10*3/uL (ref 150–400)
RBC: 5.28 MIL/uL — ABNORMAL HIGH (ref 3.87–5.11)
RDW: 13.9 % (ref 11.5–15.5)
WBC: 18.2 10*3/uL — ABNORMAL HIGH (ref 4.0–10.5)
nRBC: 0 % (ref 0.0–0.2)

## 2021-04-24 LAB — URINALYSIS, COMPLETE (UACMP) WITH MICROSCOPIC
Bilirubin Urine: NEGATIVE
Glucose, UA: NEGATIVE mg/dL
Ketones, ur: NEGATIVE mg/dL
Nitrite: NEGATIVE
Protein, ur: 100 mg/dL — AB
Specific Gravity, Urine: 1.019 (ref 1.005–1.030)
Squamous Epithelial / HPF: NONE SEEN (ref 0–5)
WBC, UA: >50 WBC/hpf — ABNORMAL HIGH (ref 0–5)
pH: 8 (ref 5.0–8.0)

## 2021-04-24 LAB — MRSA NEXT GEN BY PCR, NASAL: MRSA by PCR Next Gen: NOT DETECTED

## 2021-04-24 LAB — COMPREHENSIVE METABOLIC PANEL
ALT: 28 U/L (ref 0–44)
AST: 28 U/L (ref 15–41)
Albumin: 2.9 g/dL — ABNORMAL LOW (ref 3.5–5.0)
Alkaline Phosphatase: 120 U/L (ref 38–126)
Anion gap: 14 (ref 5–15)
BUN: 89 mg/dL — ABNORMAL HIGH (ref 6–20)
CO2: 26 mmol/L (ref 22–32)
Calcium: 9.9 mg/dL (ref 8.9–10.3)
Chloride: 104 mmol/L (ref 98–111)
Creatinine, Ser: 1.47 mg/dL — ABNORMAL HIGH (ref 0.44–1.00)
GFR, Estimated: 43 mL/min — ABNORMAL LOW (ref >60)
Glucose, Bld: 200 mg/dL — ABNORMAL HIGH (ref 70–99)
Potassium: 5.6 mmol/L — ABNORMAL HIGH (ref 3.5–5.1)
Sodium: 144 mmol/L (ref 135–145)
Total Bilirubin: 0.2 mg/dL — ABNORMAL LOW (ref 0.3–1.2)
Total Protein: 9.5 g/dL — ABNORMAL HIGH (ref 6.5–8.1)

## 2021-04-24 LAB — LACTIC ACID, PLASMA
Lactic Acid, Venous: 3.3 mmol/L (ref 0.5–1.9)
Lactic Acid, Venous: 2.8 mmol/L (ref 0.5–1.9)

## 2021-04-24 LAB — APTT: aPTT: 30 seconds (ref 24–36)

## 2021-04-24 LAB — RESP PANEL BY RT-PCR (FLU A&B, COVID) ARPGX2
Influenza A by PCR: NEGATIVE
Influenza B by PCR: NEGATIVE
SARS Coronavirus 2 by RT PCR: NEGATIVE

## 2021-04-24 LAB — PROTIME-INR
INR: 1.3 — ABNORMAL HIGH (ref 0.8–1.2)
Prothrombin Time: 16.2 seconds — ABNORMAL HIGH (ref 11.4–15.2)

## 2021-04-24 LAB — GLUCOSE, CAPILLARY: Glucose-Capillary: 53 mg/dL — ABNORMAL LOW (ref 70–99)

## 2021-04-24 MED ORDER — ONDANSETRON HCL 4 MG/2ML IJ SOLN: 4.0000 mg | Freq: Four times a day (QID) | INTRAMUSCULAR | Status: DC | PRN

## 2021-04-24 MED ORDER — MIDODRINE HCL 5 MG PO TABS
5.0000 mg | ORAL_TABLET | Freq: Three times a day (TID) | ORAL | Status: DC
  Administered 2021-04-24: 5 mg
  Filled 2021-04-24: qty 1

## 2021-04-24 MED ORDER — POLYETHYLENE GLYCOL 3350 17 G PO PACK
17.0000 g | PACK | Freq: Two times a day (BID) | ORAL | Status: AC
  Administered 2021-04-25 (×3): 17 g
  Filled 2021-04-24 (×3): qty 1

## 2021-04-24 MED ORDER — GABAPENTIN 100 MG PO CAPS
100.0000 mg | ORAL_CAPSULE | Freq: Every day | ORAL | Status: DC
  Administered 2021-04-25 – 2021-04-30 (×6): 100 mg via ORAL
  Filled 2021-04-24 (×6): qty 1

## 2021-04-24 MED ORDER — CARBAMAZEPINE 100 MG PO CHEW
50.0000 mg | CHEWABLE_TABLET | Freq: Four times a day (QID) | ORAL | Status: DC
  Administered 2021-04-24 – 2021-04-30 (×23): 50 mg
  Filled 2021-04-24 (×31): qty 0.5

## 2021-04-24 MED ORDER — METOCLOPRAMIDE HCL 5 MG/5ML PO SOLN: 10.0000 mg | Freq: Four times a day (QID) | ORAL | Status: DC | PRN

## 2021-04-24 MED ORDER — ONDANSETRON HCL 4 MG PO TABS: 4.0000 mg | ORAL_TABLET | Freq: Four times a day (QID) | ORAL | Status: DC | PRN

## 2021-04-24 MED ORDER — PANTOPRAZOLE SODIUM 40 MG IV SOLR
40.0000 mg | INTRAVENOUS | Status: DC
  Administered 2021-04-25: 40 mg via INTRAVENOUS
  Filled 2021-04-24: qty 40

## 2021-04-24 MED ORDER — SODIUM CHLORIDE 0.9 % IV SOLN
2.0000 g | Freq: Two times a day (BID) | INTRAVENOUS | Status: DC
  Administered 2021-04-25: 2 g via INTRAVENOUS
  Filled 2021-04-24 (×3): qty 2

## 2021-04-24 MED ORDER — ENOXAPARIN SODIUM 40 MG/0.4ML IJ SOSY
40.0000 mg | PREFILLED_SYRINGE | INTRAMUSCULAR | Status: DC
  Administered 2021-04-24 – 2021-04-29 (×7): 40 mg via SUBCUTANEOUS
  Filled 2021-04-24 (×6): qty 0.4

## 2021-04-24 MED ORDER — METOCLOPRAMIDE HCL 10 MG/10ML PO SOLN
10.0000 mg | Freq: Four times a day (QID) | ORAL | Status: AC | PRN
  Filled 2021-04-24: qty 10

## 2021-04-24 MED ORDER — SODIUM CHLORIDE 0.9 % IV BOLUS
500.0000 mL | Freq: Once | INTRAVENOUS | Status: AC
  Administered 2021-04-24: 500 mL via INTRAVENOUS

## 2021-04-24 MED ORDER — SODIUM CHLORIDE 0.9 % IV BOLUS
1000.0000 mL | Freq: Once | INTRAVENOUS | Status: AC
  Administered 2021-04-24: 1000 mL via INTRAVENOUS

## 2021-04-24 MED ORDER — ACETAMINOPHEN 325 MG PO TABS
650.0000 mg | ORAL_TABLET | Freq: Four times a day (QID) | ORAL | Status: DC | PRN
  Administered 2021-04-25 – 2021-04-30 (×9): 650 mg
  Filled 2021-04-24 (×10): qty 2

## 2021-04-24 MED ORDER — ACETAMINOPHEN 650 MG RE SUPP: 650.0000 mg | Freq: Four times a day (QID) | RECTAL | Status: DC | PRN

## 2021-04-24 MED ORDER — PANTOPRAZOLE SODIUM 40 MG IV SOLR
40.0000 mg | Freq: Once | INTRAVENOUS | Status: AC
  Administered 2021-04-24: 40 mg via INTRAVENOUS
  Filled 2021-04-24: qty 40

## 2021-04-24 MED ORDER — LEVOTHYROXINE SODIUM 88 MCG PO TABS
88.0000 ug | ORAL_TABLET | Freq: Every day | ORAL | Status: DC
  Administered 2021-04-25 – 2021-04-30 (×6): 88 ug
  Filled 2021-04-24 (×8): qty 1

## 2021-04-24 MED ORDER — LACTATED RINGERS IV BOLUS
1000.0000 mL | Freq: Once | INTRAVENOUS | Status: AC
  Administered 2021-04-24: 1000 mL via INTRAVENOUS

## 2021-04-24 MED ORDER — GABAPENTIN 100 MG PO CAPS: 100.0000 mg | ORAL_CAPSULE | Freq: Every day | ORAL | Status: DC

## 2021-04-24 MED ORDER — OSMOLITE 1.2 CAL PO LIQD
1000.0000 mL | ORAL | Status: DC
  Administered 2021-04-25: 1000 mL

## 2021-04-24 MED ORDER — ALBUMIN HUMAN 25 % IV SOLN
25.0000 g | Freq: Four times a day (QID) | INTRAVENOUS | Status: DC
  Administered 2021-04-25 (×3): 25 g via INTRAVENOUS
  Filled 2021-04-24 (×5): qty 100

## 2021-04-24 MED ORDER — SODIUM CHLORIDE 0.9 % IV SOLN: 2.0000 g | INTRAVENOUS | Status: DC

## 2021-04-24 MED ORDER — SODIUM CHLORIDE 0.9 % IV SOLN
2.0000 g | Freq: Once | INTRAVENOUS | Status: DC
  Filled 2021-04-24: qty 2

## 2021-04-24 MED ORDER — LEVOTHYROXINE SODIUM 88 MCG PO TABS: 88.0000 ug | ORAL_TABLET | Freq: Every day | ORAL | Status: DC

## 2021-04-24 MED ORDER — ACETAMINOPHEN 325 MG PO TABS: 650.0000 mg | ORAL_TABLET | Freq: Four times a day (QID) | ORAL | Status: DC | PRN

## 2021-04-24 MED ORDER — SODIUM CHLORIDE 0.9 % IV SOLN
2.0000 g | Freq: Once | INTRAVENOUS | Status: AC
  Administered 2021-04-24: 2 g via INTRAVENOUS
  Filled 2021-04-24: qty 2

## 2021-04-24 MED ORDER — SODIUM CHLORIDE 0.9 % IV SOLN: INTRAVENOUS | Status: DC

## 2021-04-24 MED ORDER — MIDODRINE HCL 5 MG PO TABS
5.0000 mg | ORAL_TABLET | Freq: Once | ORAL | Status: AC
  Administered 2021-04-24: 5 mg
  Filled 2021-04-24: qty 1

## 2021-04-24 MED ORDER — MIDODRINE HCL 5 MG PO TABS
10.0000 mg | ORAL_TABLET | Freq: Three times a day (TID) | ORAL | Status: DC
  Administered 2021-04-25 – 2021-04-28 (×12): 10 mg
  Filled 2021-04-24 (×12): qty 2

## 2021-04-24 MED ORDER — FREE WATER
100.0000 mL | Status: DC
  Administered 2021-04-24 – 2021-04-25 (×2): 100 mL

## 2021-04-24 MED ORDER — CHLORHEXIDINE GLUCONATE CLOTH 2 % EX PADS
6.0000 | MEDICATED_PAD | Freq: Every day | CUTANEOUS | Status: DC
  Administered 2021-04-24 – 2021-04-28 (×5): 6 via TOPICAL
  Filled 2021-04-24: qty 6

## 2021-04-24 MED ORDER — MIDODRINE HCL 5 MG PO TABS
5.0000 mg | ORAL_TABLET | Freq: Once | ORAL | Status: AC
  Administered 2021-04-24: 5 mg
  Filled 2021-04-24 (×2): qty 1

## 2021-04-24 MED ORDER — DEXTROSE 50 % IV SOLN
1.0000 | Freq: Once | INTRAVENOUS | Status: AC
  Administered 2021-04-25: 50 mL via INTRAVENOUS
  Filled 2021-04-24: qty 50

## 2021-04-24 NOTE — Consult Note (Addendum)
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 Appreciate photos taken by Dr. Sedalia Muta and uploaded to the EMR. Wound type:Pressure plus Moisture Pressure Injury POA: Yes Measurement:To be obtained by Bedside RN today prior to placement of first dressing Wound bed:red, moist, nongranulating Drainage (amount, consistency, odor) moderate serous Periwound:intact, friable with evidence of previous wound healing/scarring Dressing procedure/placement/frequency: I have provided patient with a mattress replacement with low air loss feature, and nursing with guidance for topical care to both wounds using a silver hydrofiber as a wound contact layer and topping with a silicone foam. No disposable briefs or underpads are to be used; turning and repositioning will be per house protocol and a DermaTherapy underpad used for incontinence care and repositioning. Prevalon boots and timely incontinence care will round out the POC for the integumentary system.  WOC nursing team will not follow, but will remain available to this patient, the nursing and medical teams.  Please re-consult if needed. Thanks, Ladona Mow, MSN, RN, GNP, [place]  Pager# [phone]

## 2021-04-24 NOTE — ED Notes (Signed)
MD aware of BP
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
## 2021-04-24 NOTE — Progress Notes (Signed)
Pharmacy Antibiotic Note  Joanne [name] is a 50 y.o. female admitted on 04/24/2021 with AMS, weakness and vomiting. Pharmacy has been consulted for cefepime dosing for sepsis secondary to UTI.   Patient has a reported allergy to Ceftin. Per chart review, patient has received multiple other cephalosporins (cefepime, ceftriaxone, cefazolin)  in the past without any issue.   Plan: Cefepime 2g IV every 12 hours for CrCl 30-68ml/min (70ml/min).   Height: 5\' 4"  (162.6 cm) Weight: 83 kg (182 lb 15.7 oz) IBW/kg (Calculated) : 54.7  Temp (24hrs), Avg:100.4 F (38 C), Min:100.4 F (38 C), Max:100.4 F (38 C)  Recent Labs  Lab 04/24/21 0817 04/24/21 1137  WBC 18.2*  --   CREATININE 1.47*  --   LATICACIDVEN 3.3* 2.8*    Estimated Creatinine Clearance: 47.2 mL/min (A) (by C-G formula based on SCr of 1.47 mg/dL (H)).    Allergies  Allergen Reactions   Kaopectate  [Attapulgite] Itching   Thiopental Itching   Gatifloxacin     Other reaction(s): Unknown   Ceftin [Cefuroxime]    Tomato     Antimicrobials this admission: 7/3 Cefepime >>   Microbiology results: 7/3 UCx: pending 7/3 BCx: pending   Thank you for allowing pharmacy to be a part of this patient's care.  9/3, PharmD, BCPS Clinical Pharmacist 04/24/2021 1:15 PM

## 2021-04-24 NOTE — ED Triage Notes (Signed)
PT to ER via EMS from [facility] with reports of altered mental status.  Per staff pt was last seen normal last night at 7pm.  Pt arrives alert and responsive to pain.  Pt is contractured, also arrives with foley catheter and PEG tube in place and patent.  Pt has large sacral wound observed by Dr. Scotty Court and described as healing.

## 2021-04-24 NOTE — Progress Notes (Signed)
CODE SEPSIS - PHARMACY COMMUNICATION  **Broad Spectrum Antibiotics should be administered within 1 hour of Sepsis diagnosis**  Time Code Sepsis Called/Page Received: 6568  Antibiotics Ordered: Cefepime  Time of 1st antibiotic administration: 0915  Additional action taken by pharmacy: N/A  Tressie Ellis 04/24/2021  8:34 AM

## 2021-04-24 NOTE — ED Provider Notes (Signed)
[facility] Emergency Department Provider Note  ____________________________________________  Time seen: Approximately 8:24 AM  I have reviewed the triage vital signs and the nursing notes.   HISTORY  Chief Complaint Altered Mental Status    Level 5 Caveat: Portions of the History and Physical including HPI and review of systems are unable to be completely obtained due to patient mental status  HPI Joanne [name] is a 50 y.o. female with a history of diabetes hypothyroidism sacral pressure ulcer who was sent to the ED today due to weakness and vomiting.  Staff from her facility called the ED to report that she started getting sick yesterday and had low blood pressure and vomiting at that time.  Has continued to worsen and was sent to the ED today for evaluation.  SNF documentation reports CODE STATUS is full code.    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   Traumatic brain injury [facility])      Patient Active Problem List   Diagnosis Date Noted   Closed injury of head 09/06/2020   Seizure disorder (HCC) 09/06/2020   Vitamin D deficiency 09/06/2020   Mild malnutrition (HCC)    Protein-calorie malnutrition, severe 05/20/2020   Weakness    Full code status    Hypokalemia 05/16/2020   Severe sepsis with septic shock (HCC) 05/15/2020   Pressure ulcer of sacral region, stage  4 (HCC)    Goals of care, counseling/discussion    Palliative care by specialist    DNR (do not resuscitate) discussion    Septic shock (HCC) 03/02/2020   Hypernatremia 11/15/2019   Thrombocytopenia (HCC) 11/15/2019   AKI (acute kidney injury) (HCC) 11/15/2019   HTN (hypertension) 11/15/2019   Non-insulin dependent type 2 diabetes mellitus (HCC) 11/15/2019   Hypothyroidism 11/15/2019   Severe sepsis with acute organ dysfunction (HCC) 11/15/2019   Left hemiplegia (HCC) 11/15/2019   Pressure injury of skin 11/15/2019   Aspiration pneumonia due to gastric secretions (HCC)    Acute kidney injury (AKI) with acute tubular necrosis (ATN) (HCC)    Traumatic brain injury (HCC)    Abdominal pain 08/05/2019   Vomiting 08/05/2019   Acute on chronic respiratory failure with hypoxia (HCC) 06/06/2018   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   Pneumonia due to Klebsiella pneumoniae (HCC) 06/06/2018   Acute on chronic combined systolic and diastolic CHF (congestive heart failure) (HCC) 06/06/2018   Sepsis secondary to UTI (HCC) 06/06/2018   ARDS (adult respiratory distress syndrome) (HCC) 06/06/2018   Acute on chronic respiratory failure with hypoxia (HCC) 06/06/2018   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   Anemia 05/20/2018   Gross hematuria 01/18/2018   Urinary retention 01/18/2018   Acute respiratory distress 10/23/2012   Cardiorespiratory arrest (HCC) 10/23/2012   Acute blood loss anemia 10/20/2012   Hypophosphatemia 10/20/2012   Leukopenia 10/20/2012   Altered mental status 10/19/2012   Duodenal obstruction 10/19/2012  Past Surgical History:  Procedure Laterality Date   PEG PLACEMENT N/A 05/27/2020   Procedure: PERCUTANEOUS ENDOSCOPIC GASTROSTOMY (PEG) PLACEMENT;  Surgeon: Midge Minium, MD;  Location: [facility] ENDOSCOPY;  Service: Endoscopy;  Laterality: N/A;   TRACHEOSTOMY       Prior to Admission medications   Medication Sig Start Date End Date Taking? Authorizing Provider   amoxicillin-clavulanate (AUGMENTIN) 250-62.5 MG/5ML suspension Place 10 mLs (500 mg total) into feeding tube every 8 (eight) hours. Patient not taking: Reported on 09/09/2020 06/10/20   Erin Fulling, MD  baclofen (LIORESAL) 10 MG tablet Take 10 mg by mouth 3 (three) times daily. 07/19/20   [provider]  carbamazepine (TEGRETOL) 100 MG chewable tablet Place 0.5 tablets (50 mg total) into feeding tube 4 (four) times daily. 06/10/20   Erin Fulling, MD  gabapentin (NEURONTIN) 100 MG capsule Take 1 capsule by mouth daily. 07/19/20   [provider]  levothyroxine (SYNTHROID) 88 MCG tablet Take 1 tablet (88 mcg total) by mouth daily at 6 (six) AM. 06/11/20   Erin Fulling, MD  oxyCODONE (OXY IR/ROXICODONE) 5 MG immediate release tablet Take 5 mg by mouth 3 (three) times daily as needed. 08/02/20   [provider]     Allergies Kaopectate  [attapulgite], Thiopental, Gatifloxacin, Ceftin [cefuroxime], and Tomato   Family History  Family history unknown: Yes    Social History Social History   Tobacco Use   Smoking status: Never   Smokeless tobacco: Never  Substance Use Topics   Alcohol use: Not Currently   Drug use: Not Currently    Review of Systems Level 5 Caveat: Portions of the History and Physical including HPI and review of systems are unable to be completely obtained due to patient being a poor historian   Constitutional:   No known fever.  ENT:   No rhinorrhea. Cardiovascular:   No chest pain or syncope. Respiratory:   No dyspnea or cough. Gastrointestinal: Positive vomiting Musculoskeletal:   Negative for focal pain or swelling ____________________________________________   PHYSICAL EXAM:  VITAL SIGNS: ED Triage Vitals  Enc Vitals Group     BP      Pulse      Resp      Temp      Temp src      SpO2      Weight      Height      Head Circumference      Peak Flow      Pain Score      Pain Loc      Pain Edu?      Excl. in GC?      Vital signs reviewed, nursing assessments reviewed.   Constitutional: Awake.  Not alert or oriented.  Ill-appearing. Eyes:   Conjunctivae are normal. EOMI. PERRL. ENT      Head:   Normocephalic and atraumatic.      Nose:   No congestion/rhinnorhea.       Mouth/Throat:   Dry mucous membranes,       Neck:   No meningismus. Full ROM. Hematological/Lymphatic/Immunilogical:   No cervical lymphadenopathy. Cardiovascular:   Tachycardia heart rate 125. Symmetric bilateral radial and DP pulses.  No murmurs. Cap refill less than 2 seconds. Respiratory:   To me with a respiratory rate of 26. Breath sounds are clear and equal bilaterally. No wheezes/rales/rhonchi. Gastrointestinal:   Soft with generalized tenderness.  There is a feeding tube in place in right upper quadrant.. Non distended.   No rebound, rigidity,  or guarding. Genitourinary:   Foley catheter in place, appears heavily colonized. Musculoskeletal:   Restricted range of motion in all extremities due to contractures.  Stage III sacral pressure ulcer with soft tissue tunneling at the edges, appears clean and uninfected. Neurologic:   Normal speech.  No acute focal neurologic deficits are appreciated.  Skin:    Skin is warm, dry and intact. No rash noted.  No petechiae, purpura, or bullae.  ____________________________________________    LABS (pertinent positives/negatives) (all labs ordered are listed, but only abnormal results are displayed) Labs Reviewed  LACTIC ACID, PLASMA - Abnormal; Notable for the following components:      Result Value   Lactic Acid, Venous 3.3 (*)    All other components within normal limits  LACTIC ACID, PLASMA - Abnormal; Notable for the following components:   Lactic Acid, Venous 2.8 (*)    All other components within normal limits  COMPREHENSIVE METABOLIC PANEL - Abnormal; Notable for the following components:   Potassium 5.6 (*)    Glucose, Bld 200 (*)    BUN 89 (*)    Creatinine, Ser 1.47 (*)     Total Protein 9.5 (*)    Albumin 2.9 (*)    Total Bilirubin 0.2 (*)    GFR, Estimated 43 (*)    All other components within normal limits  CBC WITH DIFFERENTIAL/PLATELET - Abnormal; Notable for the following components:   WBC 18.2 (*)    RBC 5.28 (*)    HCT 46.8 (*)    Neutro Abs 15.6 (*)    Abs Immature Granulocytes 0.09 (*)    All other components within normal limits  PROTIME-INR - Abnormal; Notable for the following components:   Prothrombin Time 16.2 (*)    INR 1.3 (*)    All other components within normal limits  URINALYSIS, COMPLETE (UACMP) WITH MICROSCOPIC - Abnormal; Notable for the following components:   Color, Urine YELLOW (*)    APPearance TURBID (*)    Hgb urine dipstick SMALL (*)    Protein, ur 100 (*)    Leukocytes,Ua LARGE (*)    WBC, UA >50 (*)    Bacteria, UA MANY (*)    All other components within normal limits  RESP PANEL BY RT-PCR (FLU A&B, COVID) ARPGX2  CULTURE, BLOOD (SINGLE)  URINE CULTURE  CULTURE, BLOOD (SINGLE)  APTT   ____________________________________________   EKG  Interpreted by me Sinus tachycardia rate 125.  Right axis.  Normal intervals.  Normal QRS ST segments and T waves.  ____________________________________________    RADIOLOGY  DG Chest Port 1 View  Result Date: 04/24/2021 CLINICAL DATA:  Questionable sepsis. EXAM: PORTABLE CHEST 1 VIEW COMPARISON:  06/08/2020 FINDINGS: Marked curvature in the spine and patient is rotated towards the right. Limited evaluation of the right lung apex because of the patient's head position. There is volume loss at the right lung base. Left lung is clear. IMPRESSION: 1. Left lung is clear. 2. Limited evaluation of the right lung due to patient positioning and volume loss. Electronically Signed   By: Richarda OverlieAdam  Henn M.D.   On: 04/24/2021 09:20   CT Renal Stone Study  Result Date: 04/24/2021 CLINICAL DATA:  Hematuria of unknown cause. EXAM: CT ABDOMEN AND PELVIS WITHOUT CONTRAST TECHNIQUE:  Multidetector CT imaging of the abdomen and pelvis was performed following the standard protocol without IV contrast. COMPARISON:  CT abdomen pelvis 03/02/2020 FINDINGS: Lower chest: Lung bases are clear.  Heart size is normal. Hepatobiliary: Cholecystectomy.  Liver is unremarkable.  Pancreas: Unremarkable. No pancreatic ductal dilatation or surrounding inflammatory changes. Spleen: Numerous calcified granulomata throughout the spleen. Adrenals/Urinary Tract: Adrenal glands are normal. Ureters are unremarkable. A Foley catheter decompresses the urinary bladder. Stomach/Bowel: Gastrostomy tube is unremarkable. Small bowel loops are normal in caliber and wall thickness. Large ventral hernia contains numerous loops of large and small bowel. Within the hernia are the cecum as well as transverse colon. There is relatively more stool within the ascending and transverse colon compared with the descending colon and sigmoid loops. Although there is no abrupt transition zone, there is question of functional narrowing at the junction of the transverse colon with the descending colon, best seen on images 42 of series 2 and 87 of series 5 at the neck of the large ventral hernia. Descending colon is gas-filled. No evidence for colonic mass. Vascular/Lymphatic: No significant vascular findings are present. No enlarged abdominal or pelvic lymph nodes. Reproductive: Uterus is present. No adnexal mass. No free pelvic fluid. Other: Moderate defect superficial to the sacrum consistent with sacral decubitus ulcer. There is increased soft tissue covering the sacrum compared with most recent exam. Musculoskeletal: Stable scoliosis and degenerative changes. IMPRESSION: 1. Suspect functional obstruction at the junction of the distal transverse colon and descending colon, at the neck of the large ventral hernia. Proximal to this point, there is moderate stool burden. Distal to this point, there is significantly less stool burden. 2. No  evidence for small bowel obstruction. 3. Cholecystectomy. 4. Unremarkable appearance of gastrostomy tube. 5. Increased soft tissue within the sacral decubitus ulcer, consistent with interval healing. Electronically Signed   By: Norva Pavlov M.D.   On: 04/24/2021 12:34    ____________________________________________   PROCEDURES .Critical Care  Date/Time: 04/24/2021 8:24 AM Performed by: Sharman Cheek, MD Authorized by: Sharman Cheek, MD   Critical care provider statement:    Critical care time (minutes):  40   Critical care time was exclusive of:  Separately billable procedures and treating other patients   Critical care was necessary to treat or prevent imminent or life-threatening deterioration of the following conditions:  Sepsis and dehydration   Critical care was time spent personally by me on the following activities:  Development of treatment plan with patient or surrogate, discussions with consultants, evaluation of patient's response to treatment, examination of patient, obtaining history from patient or surrogate, ordering and performing treatments and interventions, ordering and review of laboratory studies, ordering and review of radiographic studies, pulse oximetry, re-evaluation of patient's condition, review of old charts and blood draw for specimens Comments:       Angiocath insertion  Date/Time: 04/24/2021 8:24 AM Performed by: Sharman Cheek, MD Authorized by: Sharman Cheek, MD  Consent: The procedure was performed in an emergent situation. Patient identity confirmed: arm band Preparation: Patient was prepped and draped in the usual sterile fashion. Patient tolerance: patient tolerated the procedure well with no immediate complications Comments: Continuous [place] visualization. R AC, 1 attempt, 20g, EBL 0.    ____________________________________________  DIFFERENTIAL DIAGNOSIS   UTI, pneumonia, dehydration, electrolyte abnormality, viral  illness  CLINICAL IMPRESSION / ASSESSMENT AND PLAN / ED COURSE  Medications ordered in the ED: Medications  sodium chloride 0.9 % bolus 1,000 mL (0 mLs Intravenous Stopped 04/24/21 1133)  ceFEPIme (MAXIPIME) 2 g in sodium chloride 0.9 % 100 mL IVPB (0 g Intravenous Stopped 04/24/21 0952)  pantoprazole (PROTONIX) injection 40 mg (40 mg Intravenous Given 04/24/21 0930)  sodium chloride 0.9 % bolus 1,000 mL (0 mLs Intravenous Stopped 04/24/21  1133)  sodium chloride 0.9 % bolus 500 mL (0 mLs Intravenous Stopped 04/24/21 1221)  lactated ringers bolus 1,000 mL (1,000 mLs Intravenous New Bag/Given 04/24/21 1221)    Pertinent labs & imaging results that were available during my care of the patient were reviewed by me and considered in my medical decision making (see chart for details).   Joanne [name] was evaluated in Emergency Department on 04/24/2021 for the symptoms described in the history of present illness. She was evaluated in the context of the global COVID-19 pandemic, which necessitated consideration that the patient might be at risk for infection with the SARS-CoV-2 virus that causes COVID-19. Institutional protocols and algorithms that pertain to the evaluation of patients at risk for COVID-19 are in a state of rapid change based on information released by regulatory bodies including the CDC and federal and state organizations. These policies and algorithms were followed during the patient's care in the ED.     Clinical Course as of 04/24/21 1300  Sun Apr 24, 2021  1610 Patient presents with report of low blood pressure and vomiting that started yesterday at her skilled nursing facility.  Clinically appears dehydrated, tachycardic and febrile concerning for sepsis.  Has a chronic indwelling Foley that is heavily colonized and likely source.  Has abdominal tenderness.  Will replace Foley for clean urine sample.  Start antibiotics with aztreonam due to prior cephalosporin allergy.  IV fluid bolus.  No  signs of shock on initial presentation.  No signs of GI bleed. [PS]  0900 Chest x-ray viewed and interpreted by me, limited by patient positioning and rotation.  Lungs appear clear.  No pneumothorax or effusion. Lactate is 3.2.  CBC shows leukocytosis with white blood cell count of 18,000.  Hemoglobin is 14 which is double her chronic anemia level of 7, likely owing to dehydration. [PS]  1234 Sepsis reassessment has been completed after 30 mL/kg IV fluid bolus with 2.5 L saline.  Blood pressure 105/55.  Heart rate 90. Repeat lactate downtrending. No refractory shock, vasopressors not indicated. [PS]    Clinical Course User Index [PS] Sharman Cheek, MD     ____________________________________________   FINAL CLINICAL IMPRESSION(S) / ED DIAGNOSES    Final diagnoses:  Septic shock (HCC)  Cystitis  Mild protein-calorie malnutrition (HCC)  Left hemiplegia (HCC)  Seizure disorder (HCC)  Non-insulin dependent type 2 diabetes mellitus (HCC)  Pressure ulcer of sacral region, stage 4 (HCC)  Chronic heart failure, unspecified heart failure type [facility])     ED Discharge Orders     None       Portions of this note were generated with dragon dictation software. Dictation errors may occur despite best attempts at proofreading.   Sharman Cheek, MD 04/24/21 1300
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
## 2021-04-24 NOTE — ED Notes (Addendum)
Provider at the bedside.  

## 2021-04-24 NOTE — Consult Note (Signed)
PHARMACY -  BRIEF ANTIBIOTIC NOTE   Pharmacy has received consult(s) for aztreonam from an ED provider.  The patient's profile has been reviewed for ht/wt/allergies/indication/available labs.    Per consult instructions - allergy investigation for cefuroxime. Un-clear reaction. Per chart review, patient has received multiple beta-lactam antibiotics previously. Will change aztreonam to cefepime.  One time order(s) placed for --Cefepime 2 g IV  Further antibiotics/pharmacy consults should be ordered by admitting physician if indicated.                       Thank you, Tressie Ellis 04/24/2021  8:35 AM

## 2021-04-25 DIAGNOSIS — R627 Adult failure to thrive: Secondary | ICD-10-CM

## 2021-04-25 DIAGNOSIS — Z515 Encounter for palliative care: Secondary | ICD-10-CM

## 2021-04-25 DIAGNOSIS — Z7189 Other specified counseling: Secondary | ICD-10-CM

## 2021-04-25 LAB — BASIC METABOLIC PANEL
Anion gap: 12 (ref 5–15)
BUN: 44 mg/dL — ABNORMAL HIGH (ref 6–20)
CO2: 22 mmol/L (ref 22–32)
Calcium: 8.7 mg/dL — ABNORMAL LOW (ref 8.9–10.3)
Chloride: 112 mmol/L — ABNORMAL HIGH (ref 98–111)
Creatinine, Ser: 0.59 mg/dL (ref 0.44–1.00)
GFR, Estimated: >60 mL/min (ref >60)
Glucose, Bld: 93 mg/dL (ref 70–99)
Potassium: 3.7 mmol/L (ref 3.5–5.1)
Sodium: 146 mmol/L — ABNORMAL HIGH (ref 135–145)

## 2021-04-25 LAB — GLUCOSE, CAPILLARY
Glucose-Capillary: 105 mg/dL — ABNORMAL HIGH (ref 70–99)
Glucose-Capillary: 105 mg/dL — ABNORMAL HIGH (ref 70–99)
Glucose-Capillary: 124 mg/dL — ABNORMAL HIGH (ref 70–99)
Glucose-Capillary: 140 mg/dL — ABNORMAL HIGH (ref 70–99)
Glucose-Capillary: 140 mg/dL — ABNORMAL HIGH (ref 70–99)
Glucose-Capillary: 50 mg/dL — ABNORMAL LOW (ref 70–99)
Glucose-Capillary: 83 mg/dL (ref 70–99)
Glucose-Capillary: 87 mg/dL (ref 70–99)

## 2021-04-25 LAB — URINE CULTURE

## 2021-04-25 LAB — HIV ANTIBODY (ROUTINE TESTING W REFLEX): HIV Screen 4th Generation wRfx: NONREACTIVE

## 2021-04-25 LAB — PROCALCITONIN
Procalcitonin: 0.48 ng/mL
Procalcitonin: 0.3 ng/mL

## 2021-04-25 LAB — CBC
HCT: 33 % — ABNORMAL LOW (ref 36.0–46.0)
Hemoglobin: 10.6 g/dL — ABNORMAL LOW (ref 12.0–15.0)
MCH: 28.8 pg (ref 26.0–34.0)
MCHC: 32.1 g/dL (ref 30.0–36.0)
MCV: 89.7 fL (ref 80.0–100.0)
Platelets: 185 10*3/uL (ref 150–400)
RBC: 3.68 MIL/uL — ABNORMAL LOW (ref 3.87–5.11)
RDW: 45.2 % — ABNORMAL HIGH (ref 11.5–15.5)
WBC: 11.7 10*3/uL — ABNORMAL HIGH (ref 4.0–10.5)
nRBC: 0 % (ref 0.0–0.2)

## 2021-04-25 LAB — PHOSPHORUS: Phosphorus: 1.6 mg/dL — ABNORMAL LOW (ref 2.5–4.6)

## 2021-04-25 LAB — LACTIC ACID, PLASMA: Lactic Acid, Venous: 3.2 mmol/L (ref 0.5–1.9)

## 2021-04-25 LAB — CORTISOL: Cortisol, Plasma: 17.5 ug/dL

## 2021-04-25 LAB — MAGNESIUM: Magnesium: 1.6 mg/dL — ABNORMAL LOW (ref 1.7–2.4)

## 2021-04-25 MED ORDER — PANTOPRAZOLE SODIUM 40 MG PO TBEC: 40.0000 mg | DELAYED_RELEASE_TABLET | Freq: Every day | ORAL | Status: DC

## 2021-04-25 MED ORDER — MAGNESIUM SULFATE 2 GM/50ML IV SOLN
2.0000 g | Freq: Once | INTRAVENOUS | Status: AC
  Administered 2021-04-25: 2 g via INTRAVENOUS
  Filled 2021-04-25: qty 50

## 2021-04-25 MED ORDER — SODIUM CHLORIDE 0.9 % IV SOLN
3.0000 g | Freq: Four times a day (QID) | INTRAVENOUS | Status: DC
  Administered 2021-04-25 – 2021-04-30 (×21): 3 g via INTRAVENOUS
  Filled 2021-04-25 (×2): qty 8
  Filled 2021-04-25: qty 3
  Filled 2021-04-25 (×2): qty 8
  Filled 2021-04-25: qty 3
  Filled 2021-04-25 (×3): qty 8
  Filled 2021-04-25: qty 3
  Filled 2021-04-25 (×4): qty 8
  Filled 2021-04-25: qty 3
  Filled 2021-04-25: qty 8
  Filled 2021-04-25: qty 3
  Filled 2021-04-25: qty 8
  Filled 2021-04-25: qty 3
  Filled 2021-04-25 (×3): qty 8
  Filled 2021-04-25: qty 3
  Filled 2021-04-25 (×2): qty 8

## 2021-04-25 MED ORDER — PANTOPRAZOLE SODIUM 40 MG PO PACK
40.0000 mg | PACK | Freq: Every day | ORAL | Status: DC
  Administered 2021-04-25 – 2021-04-30 (×6): 40 mg
  Filled 2021-04-25 (×5): qty 20

## 2021-04-25 MED ORDER — JUVEN PO PACK
1.0000 | PACK | Freq: Two times a day (BID) | ORAL | Status: DC
  Administered 2021-04-25 – 2021-04-30 (×12): 1

## 2021-04-25 MED ORDER — FREE WATER
150.0000 mL | Status: DC
  Administered 2021-04-25 – 2021-04-30 (×32): 150 mL

## 2021-04-25 MED ORDER — POTASSIUM PHOSPHATES 15 MMOLE/5ML IV SOLN
20.0000 mmol | Freq: Once | INTRAVENOUS | Status: AC
  Administered 2021-04-25: 20 mmol via INTRAVENOUS
  Filled 2021-04-25: qty 6.67

## 2021-04-25 MED ORDER — PROSOURCE TF PO LIQD
45.0000 mL | Freq: Two times a day (BID) | ORAL | Status: DC
  Administered 2021-04-25 – 2021-04-30 (×11): 45 mL
  Filled 2021-04-25 (×13): qty 45

## 2021-04-25 MED ORDER — SODIUM CHLORIDE 0.9 % IV SOLN: INTRAVENOUS | Status: DC

## 2021-04-25 MED ORDER — OSMOLITE 1.5 CAL PO LIQD
1000.0000 mL | ORAL | Status: DC
  Administered 2021-04-25 – 2021-04-28 (×3): 1000 mL

## 2021-04-25 NOTE — Progress Notes (Signed)
Assessed pt for PICC placement with Nash Dimmer Cothren.  BUE with contractures noted.  Spoke with Fleet Contras RN re PICC need.  States she was able to place another PIV and does not think the pt needs a PICC.  States will get PICC cancelled.
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
## 2021-04-25 NOTE — Progress Notes (Signed)
Initial Nutrition Assessment  DOCUMENTATION CODES:  Not applicable  INTERVENTION:  Continue TF via PEG. Recommend the following: Osmolite 1.5 @ 32mL/h (1200 mL/d) with 2 packets of prsource daily (40kcal and 11g of protein each) Free water flush: free water q4h (per MD) TF regimen provides 1880kcal, 97g of protein, of free water (TF+flush) 1 packet Juven BID, each packet provides 95 calories, 2.5 grams of protein (collagen), and 9.8 grams of carbohydrate (3 grams sugar); also contains 7 grams of L-arginine and L-glutamine, 300 mg vitamin C, 15 mg vitamin E, 1.2 mcg vitamin B-12, 9.5 mg zinc, 200 mg calcium, and 1.5 g  Calcium Beta-hydroxy-Beta-methylbutyrate to support wound healing   NUTRITION DIAGNOSIS:  Increased nutrient needs related to wound healing as evidenced by estimated needs.  GOAL:  Patient will meet greater than or equal to 90% of their needs  MONITOR:  TF tolerance, Skin, I & O's, Labs  REASON FOR ASSESSMENT:  Consult Enteral/tube feeding initiation and management  ASSESSMENT:  50 y.o. female with a history of DM, hypothyroidism, sacral pressure ulcer, CHF, and Hx TBI presented to ED from facility due to weakness and vomiting.  Staff report that she started getting sick 7/2 and had low blood pressure and vomiting at that time. Chronic PEG in place   Pt resting in bed at the time of visit. Awake and alert, does not respond to most nutrition questions. Osmolite 1.5 currently hanging running at 88mL/h.  Called pt's facility [facility]) and spoke with RN. States that pt is typically on Jevity 1.5, unsure of rate but does say pt also takes food orally at each meal. However, oral feeding is not substantial and the majority of nutrition is provided via bolus feeds. Will leave pt on osmolite 1.5 for now and transistion back to bolus feeds of Jevity once more stable and able to be moved from ICU.  Nutritionally Relevant Medications: Scheduled  Meds:  free water  150 mL Per Tube Q4H   pantoprazole sodium  40 mg Per Tube Daily   polyethylene glycol  17 g Per Tube BID   Continuous Infusions:  sodium chloride 75 mL/hr at 04/24/21 2357   albumin human 25 g (04/25/21 0230)   ampicillin-sulbactam (UNASYN) IV     feeding supplement (OSMOLITE 1.2 CAL) 1,000 mL (04/25/21 0000)   magnesium sulfate bolus IVPB     potassium PHOSPHATE IVPB (in mmol)     PRN Meds: metoCLOPramide, ondansetron  Labs Reviewed: Na 146 BUN 44 Phosphorus 1.6 Mg 1.6 Lactic Acid 3.2  NUTRITION - FOCUSED PHYSICAL EXAM: Flowsheet Row Most Recent Value  Orbital Region No depletion  Upper Arm Region No depletion  Thoracic and Lumbar Region No depletion  Buccal Region No depletion  Temple Region Moderate depletion  Clavicle Bone Region No depletion  Clavicle and Acromion Bone Region No depletion  Scapular Bone Region No depletion  Dorsal Hand No depletion  Patellar Region Mild depletion  Anterior Thigh Region Mild depletion  Posterior Calf Region Mild depletion  Edema (RD Assessment) Mild  Hair Reviewed  Eyes Reviewed  Mouth Reviewed  Skin Reviewed  Nails Reviewed   Diet Order:   Diet Order             Diet NPO time specified  Diet effective now                   EDUCATION NEEDS:  No education needs have been identified at this time  Skin:  Skin Assessment:  Skin Integrity Issues: Skin Integrity Issues:: Stage II Stage II: sacrum  Last BM:  PTA  Height:  Ht Readings from Last 1 Encounters:  04/24/21 5\' 4"  (1.626 m)    Weight:  Wt Readings from Last 1 Encounters:  04/24/21 83 kg    Ideal Body Weight:  54.5 kg  BMI:  Body mass index is 31.41 kg/m.  Estimated Nutritional Needs:  Kcal:  1700-2000 kcal/d Protein:  90-100 g/d Fluid:  1.8-2L/d  06/25/21, RD, LDN Clinical Dietitian Pager on Amion
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
## 2021-04-26 ENCOUNTER — Inpatient Hospital Stay: Payer: Self-pay

## 2021-04-26 DIAGNOSIS — K9423 Gastrostomy malfunction: Secondary | ICD-10-CM

## 2021-04-26 LAB — GLUCOSE, CAPILLARY
Glucose-Capillary: 106 mg/dL — ABNORMAL HIGH (ref 70–99)
Glucose-Capillary: 93 mg/dL (ref 70–99)
Glucose-Capillary: 111 mg/dL — ABNORMAL HIGH (ref 70–99)
Glucose-Capillary: 99 mg/dL (ref 70–99)
Glucose-Capillary: 108 mg/dL — ABNORMAL HIGH (ref 70–99)

## 2021-04-26 LAB — BASIC METABOLIC PANEL
Anion gap: 7 (ref 5–15)
BUN: 40 mg/dL — ABNORMAL HIGH (ref 6–20)
CO2: 29 mmol/L (ref 22–32)
Calcium: 8.6 mg/dL — ABNORMAL LOW (ref 8.9–10.3)
Chloride: 111 mmol/L (ref 98–111)
Creatinine, Ser: 0.58 mg/dL (ref 0.44–1.00)
GFR, Estimated: >60 mL/min (ref >60)
Glucose, Bld: 108 mg/dL — ABNORMAL HIGH (ref 70–99)
Potassium: 4.3 mmol/L (ref 3.5–5.1)
Sodium: 147 mmol/L — ABNORMAL HIGH (ref 135–145)

## 2021-04-26 LAB — LACTIC ACID, PLASMA: Lactic Acid, Venous: 2.5 mmol/L (ref 0.5–1.9)

## 2021-04-26 LAB — PHOSPHORUS: Phosphorus: 1.8 mg/dL — ABNORMAL LOW (ref 2.5–4.6)

## 2021-04-26 LAB — MAGNESIUM: Magnesium: 2.3 mg/dL (ref 1.7–2.4)

## 2021-04-26 MED ORDER — POTASSIUM & SODIUM PHOSPHATES 280-160-250 MG PO PACK
2.0000 | PACK | ORAL | Status: AC
  Administered 2021-04-26 (×4): 2
  Filled 2021-04-26 (×5): qty 2
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
## 2021-04-26 NOTE — Consult Note (Signed)
Date of Consultation:  04/26/2021  Requesting Physician:  Enedina Finner, MD  Reason for Consultation:  PEG tube leakage  History of Present Illness: Joanne [name] is a 50 y.o. female admitted on 04/24/21 with weakness and vomiting.  Was found to have hypotension, tachycardia, elevated WBC and lactic acid, and AKI.  She has history of traumatic brain injury and has severe contractures, and she's had issues with decubitus wounds in the past.  On admission was found to have UTI.  CT scan did not reveal any intra-abdominal abscess.  There was concern about possible large bowel partial obstruction due to abdominal hernia containing transverse colon.  Per RN reports, there has also been leakage of yellow color fluid around her PEG tube.  She's currently more stable without requiring any pressors, tolerating tube feeds, and on IV antibiotics.    Past Medical History: 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  Authorizing Provider  acetaminophen (TYLENOL) 325 MG tablet Take 650 mg by mouth every 8 (eight) hours as needed for mild pain or moderate pain.   Yes [provider]  baclofen (LIORESAL) 10 MG tablet Take 10 mg by mouth 3 (three) times daily. 07/19/20  Yes [provider]  carbamazepine (TEGRETOL XR) 100 MG 12 hr tablet Take 50 mg by mouth 4 (four) times daily.   Yes [provider]  ferrous sulfate 325 (65 FE) MG tablet Take 325 mg by mouth daily with breakfast.   Yes [provider]  gabapentin (NEURONTIN) 100 MG capsule Take 100 mg by mouth 2 (two) times daily.   Yes [provider]  levothyroxine (SYNTHROID) 100 MCG tablet Take 100 mcg by mouth daily before breakfast.   Yes [provider]  oxycodone (OXY-IR) 5 MG capsule Take 5 mg by mouth 3 (three) times daily.   Yes [provider]    Allergies: Allergies  Allergen Reactions   Kaopectate  [Attapulgite] Itching   Thiopental Itching   Gatifloxacin     Other reaction(s): Unknown   Ceftin [Cefuroxime]    Tomato     Social History:  reports that she has never smoked. She has never used smokeless tobacco. She reports previous alcohol use. She reports previous drug use.   Family History: Family History  Family history unknown: Yes    Review of Systems: Review of Systems  Unable to perform ROS: Mental acuity   Physical Exam BP 104/70   Pulse 100   Temp 99.1 F (37.3  C) (Oral)   Resp 19   Ht 5\' 4"  (1.626 m)   Wt 83 kg   LMP  (LMP Unknown)   SpO2 97%   BMI 31.41 kg/m  CONSTITUTIONAL: No acute distress, very contorted. RESPIRATORY:  Normal respiratory effort without pathologic use of accessory muscles. CARDIOVASCULAR:  Regular rhythm and rate. GI: The abdomen is soft, non-distended, does not appear tender.  The patient's ventral hernia is reducible without pain and without overlying skin changes.  There is a PEG tube in the patient's RUQ.  The silicone disc is  loose rather than abutting the skin, which is allowing drainage of fluid around the tube.  Gas also heard leaking around tube.  The silicone disc was pushed down to be flush against the skin, eliminating the wiggling the tube was doing before.  No leakage noted after.  Dry gauze dressing applied.  No overlying skin erythema or induration to suggest abscess. MUSCULOSKELETAL:  Patient has significant contractures of both arms and legs. NEUROLOGIC:  Unable to assess. PSYCH:  Unable to assess.  Laboratory Analysis: Results for orders placed or performed during the hospital encounter of 04/24/21 (from the past 24 hour(s))  Glucose, capillary     Status: Abnormal   Collection Time: 04/25/21  7:39 PM  Result Value Ref Range   Glucose-Capillary 140 (H) 70 - 99 mg/dL  Glucose, capillary     Status: Abnormal   Collection Time: 04/25/21 11:45 PM  Result Value Ref Range   Glucose-Capillary 105 (H) 70 - 99 mg/dL  Glucose, capillary     Status: Abnormal   Collection Time: 04/26/21  3:29 AM  Result Value Ref Range   Glucose-Capillary 106 (H) 70 - 99 mg/dL  Basic metabolic panel     Status: Abnormal   Collection Time: 04/26/21  6:39 AM  Result Value Ref Range   Sodium 147 (H) 135 - 145 mmol/L   Potassium 4.3 3.5 - 5.1 mmol/L   Chloride 111 98 - 111 mmol/L   CO2 29 22 - 32 mmol/L   Glucose, Bld 108 (H) 70 - 99 mg/dL   BUN 40 (H) 6 - 20 mg/dL   Creatinine, Ser 06/27/21 0.44 - 1.00 mg/dL   Calcium 8.6 (L) 8.9 - 10.3 mg/dL   GFR, Estimated 0.97 >35 mL/min   Anion gap 7 5 - 15  Magnesium     Status: None   Collection Time: 04/26/21  6:39 AM  Result Value Ref Range   Magnesium 2.3 1.7 - 2.4 mg/dL  Phosphorus     Status: Abnormal   Collection Time: 04/26/21  6:39 AM  Result Value Ref Range   Phosphorus 1.8 (L) 2.5 - 4.6 mg/dL  Lactic acid, plasma     Status: Abnormal   Collection Time: 04/26/21  6:39 AM  Result Value Ref Range   Lactic Acid, Venous 2.5 (HH) 0.5 - 1.9 mmol/L  Glucose, capillary      Status: None   Collection Time: 04/26/21  7:44 AM  Result Value Ref Range   Glucose-Capillary 93 70 - 99 mg/dL  Glucose, capillary     Status: Abnormal   Collection Time: 04/26/21 12:05 PM  Result Value Ref Range   Glucose-Capillary 111 (H) 70 - 99 mg/dL  Glucose, capillary     Status: None   Collection Time: 04/26/21  4:29 PM  Result Value Ref Range   Glucose-Capillary 99 70 - 99 mg/dL    Imaging: CT abdomen/pelvis w/o contrast 04/24/21: IMPRESSION: 1. Suspect functional obstruction at  the junction of the distal transverse colon and descending colon, at the neck of the large ventral hernia. Proximal to this point, there is moderate stool burden. Distal to this point, there is significantly less stool burden. 2. No evidence for small bowel obstruction. 3. Cholecystectomy. 4. Unremarkable appearance of gastrostomy tube. 5. Increased soft tissue within the sacral decubitus ulcer, consistent with interval healing.    Assessment and Plan: This is a 50 y.o. female with leakage around PEG tube.  --Pushed silicone disc down so it's flush against the skin.  I think it was too loose and allowing gastric contents and tube feeds to leak around the tube.  No abscess noted and no other procedures needed.  If there are still leakage concerns, may need tube study with IR to verify appropriate placement of the tube within the stomach, although this appears to be the case on her CT scan.  Face-to-face time spent with the patient and care providers was 55 minutes, with more than 50% of the time spent counseling, educating, and coordinating care of the patient.     Howie Ill, MD [facility]g:  [phone]
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
## 2021-04-27 LAB — GLUCOSE, CAPILLARY
Glucose-Capillary: 112 mg/dL — ABNORMAL HIGH (ref 70–99)
Glucose-Capillary: 115 mg/dL — ABNORMAL HIGH (ref 70–99)
Glucose-Capillary: 119 mg/dL — ABNORMAL HIGH (ref 70–99)
Glucose-Capillary: 121 mg/dL — ABNORMAL HIGH (ref 70–99)
Glucose-Capillary: 124 mg/dL — ABNORMAL HIGH (ref 70–99)
Glucose-Capillary: 146 mg/dL — ABNORMAL HIGH (ref 70–99)

## 2021-04-27 LAB — BASIC METABOLIC PANEL
Anion gap: 8 (ref 5–15)
BUN: 25 mg/dL — ABNORMAL HIGH (ref 6–20)
CO2: 27 mmol/L (ref 22–32)
Calcium: 8.1 mg/dL — ABNORMAL LOW (ref 8.9–10.3)
Chloride: 110 mmol/L (ref 98–111)
Creatinine, Ser: 0.51 mg/dL (ref 0.44–1.00)
GFR, Estimated: >60 mL/min (ref >60)
Glucose, Bld: 126 mg/dL — ABNORMAL HIGH (ref 70–99)
Potassium: 4 mmol/L (ref 3.5–5.1)
Sodium: 145 mmol/L (ref 135–145)

## 2021-04-27 LAB — PHOSPHORUS: Phosphorus: 4 mg/dL (ref 2.5–4.6)

## 2021-04-27 LAB — LACTIC ACID, PLASMA: Lactic Acid, Venous: 1.3 mmol/L (ref 0.5–1.9)
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
## 2021-04-28 LAB — BASIC METABOLIC PANEL
Anion gap: 6 (ref 5–15)
BUN: 22 mg/dL — ABNORMAL HIGH (ref 6–20)
CO2: 29 mmol/L (ref 22–32)
Calcium: 8.2 mg/dL — ABNORMAL LOW (ref 8.9–10.3)
Chloride: 106 mmol/L (ref 98–111)
Creatinine, Ser: 0.45 mg/dL (ref 0.44–1.00)
GFR, Estimated: >60 mL/min (ref >60)
Glucose, Bld: 111 mg/dL — ABNORMAL HIGH (ref 70–99)
Potassium: 4.5 mmol/L (ref 3.5–5.1)
Sodium: 141 mmol/L (ref 135–145)

## 2021-04-28 LAB — GLUCOSE, CAPILLARY
Glucose-Capillary: 100 mg/dL — ABNORMAL HIGH (ref 70–99)
Glucose-Capillary: 103 mg/dL — ABNORMAL HIGH (ref 70–99)
Glucose-Capillary: 110 mg/dL — ABNORMAL HIGH (ref 70–99)
Glucose-Capillary: 117 mg/dL — ABNORMAL HIGH (ref 70–99)
Glucose-Capillary: 123 mg/dL — ABNORMAL HIGH (ref 70–99)
Glucose-Capillary: 124 mg/dL — ABNORMAL HIGH (ref 70–99)

## 2021-04-28 LAB — CBC
HCT: 28.4 % — ABNORMAL LOW (ref 36.0–46.0)
Hemoglobin: 9 g/dL — ABNORMAL LOW (ref 12.0–15.0)
MCH: 28.2 pg (ref 26.0–34.0)
MCHC: 31.7 g/dL (ref 30.0–36.0)
MCV: 89 fL (ref 80.0–100.0)
Platelets: 200 10*3/uL (ref 150–400)
RBC: 3.19 MIL/uL — ABNORMAL LOW (ref 3.87–5.11)
RDW: 13.4 % (ref 11.5–15.5)
WBC: 11.4 10*3/uL — ABNORMAL HIGH (ref 4.0–10.5)
nRBC: 0 % (ref 0.0–0.2)

## 2021-04-28 LAB — PHOSPHORUS: Phosphorus: 2.9 mg/dL (ref 2.5–4.6)

## 2021-04-28 LAB — MAGNESIUM: Magnesium: 1.7 mg/dL (ref 1.7–2.4)

## 2021-04-28 MED ORDER — BISACODYL 5 MG PO TBEC
10.0000 mg | DELAYED_RELEASE_TABLET | Freq: Every day | ORAL | Status: DC
  Administered 2021-04-28: 10 mg via ORAL
  Filled 2021-04-28: qty 2

## 2021-04-28 MED ORDER — POLYETHYLENE GLYCOL 3350 17 G PO PACK
17.0000 g | PACK | Freq: Every day | ORAL | Status: DC
  Administered 2021-04-28: 09:00:00 17 g via ORAL
  Filled 2021-04-28: qty 1
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
## 2021-04-28 NOTE — TOC Progression Note (Signed)
Transition of Care [facility]) - Progression Note    Patient Details  Name: Joanne [name] MRN: [id number] Date of Birth: 07-02-1971  Transition of Care [facility]) CM/SW Contact  Caryn Section, RN Phone Number: 04/28/2021, 3:02 PM  Clinical Narrative:  patient is a resident of [facility].  Guardian and [facility] notified via voicemail of patient's pending return.  TOC contact information given to both, TOC will follow to discharge.          Expected Discharge Plan and Services                                                 Social Determinants of Health (SDOH) Interventions    Readmission Risk Interventions Readmission Risk Prevention Plan 03/10/2020 03/03/2020  Transportation Screening Complete Complete  PCP or Specialist Appt within 3-5 Days Complete Complete  HRI or Home Care Consult Complete Complete  Social Work Consult for Recovery Care Planning/Counseling Complete -  Palliative Care Screening Not Applicable Complete  Medication Review [facility]) Complete Complete  Some recent data might be hidden

## 2021-04-28 NOTE — Consult Note (Signed)
WOC Nurse Consult Note: Request for consult on this patient for the mid thoracic and sacral wounds, chronic, nonhealing.  Patient was seen on Sunday, 04/24/21.  Please see note and wound care orders from that encounter.  Bedside RN is requested to obtain measurements today and document on Nursing Flow Sheet.  WOC nursing team will not follow, but will remain available to this patient, the nursing and medical teams.  Please re-consult if needed.   Thanks, Ladona Mow, MSN, RN, GNP, [place]  Pager# [phone]

## 2021-04-28 NOTE — Care Management Important Message (Signed)
Important Message  Patient Details  Name: Joanne [name] MRN: [id number] Date of Birth: [birth year]   Medicare Important Message Given:  N/A - LOS <3 / Initial given by admissions     [name] A Teancum Brule 04/28/2021, 9:07 AM
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
## 2021-04-29 LAB — MAGNESIUM: Magnesium: 1.7 mg/dL (ref 1.7–2.4)

## 2021-04-29 LAB — BASIC METABOLIC PANEL
Anion gap: 7 (ref 5–15)
BUN: 22 mg/dL — ABNORMAL HIGH (ref 6–20)
CO2: 27 mmol/L (ref 22–32)
Calcium: 8.6 mg/dL — ABNORMAL LOW (ref 8.9–10.3)
Chloride: 104 mmol/L (ref 98–111)
Creatinine, Ser: 0.43 mg/dL — ABNORMAL LOW (ref 0.44–1.00)
GFR, Estimated: >60 mL/min (ref >60)
Glucose, Bld: 109 mg/dL — ABNORMAL HIGH (ref 70–99)
Potassium: 4.5 mmol/L (ref 3.5–5.1)
Sodium: 138 mmol/L (ref 135–145)

## 2021-04-29 LAB — CULTURE, BLOOD (SINGLE)
Culture: NO GROWTH
Culture: NO GROWTH
Special Requests: ADEQUATE

## 2021-04-29 LAB — CBC
HCT: 31.2 % — ABNORMAL LOW (ref 36.0–46.0)
Hemoglobin: 10.1 g/dL — ABNORMAL LOW (ref 12.0–15.0)
MCH: 28.2 pg (ref 26.0–34.0)
MCHC: 32.4 g/dL (ref 30.0–36.0)
MCV: 87.2 fL (ref 80.0–100.0)
Platelets: 242 10*3/uL (ref 150–400)
RBC: 3.58 MIL/uL — ABNORMAL LOW (ref 3.87–5.11)
RDW: 13.2 % (ref 11.5–15.5)
WBC: 9.5 10*3/uL (ref 4.0–10.5)
nRBC: 0 % (ref 0.0–0.2)

## 2021-04-29 LAB — GLUCOSE, CAPILLARY
Glucose-Capillary: 103 mg/dL — ABNORMAL HIGH (ref 70–99)
Glucose-Capillary: 109 mg/dL — ABNORMAL HIGH (ref 70–99)
Glucose-Capillary: 137 mg/dL — ABNORMAL HIGH (ref 70–99)
Glucose-Capillary: 91 mg/dL (ref 70–99)
Glucose-Capillary: 95 mg/dL (ref 70–99)

## 2021-04-29 LAB — PHOSPHORUS: Phosphorus: 2.7 mg/dL (ref 2.5–4.6)

## 2021-04-29 LAB — SARS CORONAVIRUS 2 (TAT 6-24 HRS): SARS Coronavirus 2: NEGATIVE

## 2021-04-29 MED ORDER — MIDODRINE HCL 5 MG PO TABS: 10.0000 mg | ORAL_TABLET | Freq: Three times a day (TID) | ORAL | Status: DC | PRN

## 2021-04-29 MED ORDER — JEVITY 1.5 CAL/FIBER PO LIQD
1000.0000 mL | ORAL | Status: DC
  Administered 2021-04-29: 1000 mL
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
## 2021-04-29 NOTE — TOC Progression Note (Signed)
Transition of Care [facility]) - Progression Note    Patient Details  Name: Joanne [name] MRN: [id number] Date of Birth: 1971-09-26  Transition of Care [facility]) CM/SW Contact  Caryn Section, RN Phone Number: 04/29/2021, 4:10 PM  Clinical Narrative:   As per Archie Patten from , they are able to take patient back after PT/OT eval and medical discharge.  Will obtain auth following Pt OT eval, care team aware.  Message left for guardian to discuss plan, awaiting response.         Expected Discharge Plan and Services                                                 Social Determinants of Health (SDOH) Interventions    Readmission Risk Interventions Readmission Risk Prevention Plan 03/10/2020 03/03/2020  Transportation Screening Complete Complete  PCP or Specialist Appt within 3-5 Days Complete Complete  HRI or Home Care Consult Complete Complete  Social Work Consult for Recovery Care Planning/Counseling Complete -  Palliative Care Screening Not Applicable Complete  Medication Review Oceanographer) Complete Complete  Some recent data might be hidden
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
## 2021-04-29 NOTE — Progress Notes (Addendum)
Nutrition Follow-up  DOCUMENTATION CODES:  Not applicable  INTERVENTION:  Continue TF via PEG. Recommend the following: Jevity 1.5 @ 3mL/h (1200 mL/d) with 2 packets of prosource TF daily (40kcal and 11g of protein each) Free water flush: free water q4h (per MD) TF regimen provides 1880kcal, 98g of protein, of free water (TF+flush) 1 packet Juven BID, each packet provides 95 calories, 2.5 grams of protein (collagen), and 9.8 grams of carbohydrate (3 grams sugar); also contains 7 grams of L-arginine and L-glutamine, 300 mg vitamin C, 15 mg vitamin E, 1.2 mcg vitamin B-12, 9.5 mg zinc, 200 mg calcium, and 1.5 g  Calcium Beta-hydroxy-Beta-methylbutyrate to support wound healing  Recommend SLP evaluation to make recommendations for PO feeds  NUTRITION DIAGNOSIS:  Increased nutrient needs related to wound healing as evidenced by estimated needs.  GOAL:  Patient will meet greater than or equal to 90% of their needs  MONITOR:  TF tolerance, Skin, I & O's, Labs  REASON FOR ASSESSMENT:  Consult Enteral/tube feeding initiation and management  ASSESSMENT:  50 y.o. female with a history of DM, hypothyroidism, sacral pressure ulcer, CHF, and Hx TBI presented to ED from facility due to weakness and vomiting.  Staff report that she started getting sick 7/2 and had low blood pressure and vomiting at that time. Chronic PEG in place   Pt resting in bed at the time of visit. Awake and alert. Pt reports feeling well today, no complaints. Pt's labs are stable and noted pt is having liquid stools. Will adjust to fiber containing Jevity 1.5 (which is what pt utilizes at facility) in anticipation that pt will be dc soon and to help add bulk to stools. Pt also has bowel regimen added daily, would likely benefit from adjusting medications to receiving less frequently.  Talked with baseline PO intake with MD, SLP consult to be entered to assess for safe food consistencies to allow oral  intake.  Nutritionally Relevant Medications: Scheduled Meds:  bisacodyl  10 mg Oral Daily   feeding supplement (PROSource TF)  45 mL Per Tube BID   free water  150 mL Per Tube Q4H   levothyroxine  88 mcg Per Tube Q0600   nutrition supplement (JUVEN)  1 packet Per Tube BID BM   pantoprazole sodium  40 mg Per Tube Daily   polyethylene glycol  17 g Oral Daily   Continuous Infusions:  ampicillin-sulbactam (UNASYN) IV 3 g (04/29/21 0639)   feeding supplement (OSMOLITE 1.5 CAL) 1,000 mL (04/28/21 1642)   PRN Meds: ondansetron   Labs Reviewed: BUN 22  NUTRITION - FOCUSED PHYSICAL EXAM: Flowsheet Row Most Recent Value  Orbital Region No depletion  Upper Arm Region No depletion  Thoracic and Lumbar Region No depletion  Buccal Region No depletion  Temple Region Moderate depletion  Clavicle Bone Region No depletion  Clavicle and Acromion Bone Region No depletion  Scapular Bone Region No depletion  Dorsal Hand No depletion  Patellar Region Mild depletion  Anterior Thigh Region Mild depletion  Posterior Calf Region Mild depletion  Edema (RD Assessment) Mild  Hair Reviewed  Eyes Reviewed  Mouth Reviewed  Skin Reviewed  Nails Reviewed   Diet Order:   Diet Order             Diet NPO time specified  Diet effective now                   EDUCATION NEEDS:  No education needs have been identified at this time  Skin:  Skin Assessment: Skin Integrity Issues: Skin Integrity Issues:: Stage II Stage II: sacrum  Last BM:  7/8 - type 7  Height:  Ht Readings from Last 1 Encounters:  04/24/21 5\' 4"  (1.626 m)    Weight:  Wt Readings from Last 1 Encounters:  04/29/21 66.5 kg    Ideal Body Weight:  54.5 kg  BMI:  Body mass index is 25.16 kg/m.  Estimated Nutritional Needs:  Kcal:  1700-2000 kcal/d Protein:  90-100 g/d Fluid:  1.8-2L/d  06/30/21, RD, LDN Clinical Dietitian Pager on Amion
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
## 2021-04-29 NOTE — Care Management Important Message (Signed)
Important Message  Patient Details  Name: Joanne [name] MRN: [id number] Date of Birth: Oct 09, 1971   Medicare Important Message Given:  Yes  Patient was sleeping so I left a copy of the Important Message from Medicare on her bedside table for her to review at her convenience.   [name] A Sherril Heyward 04/29/2021, 11:22 AM

## 2021-04-30 LAB — CBC
HCT: 29.4 % — ABNORMAL LOW (ref 36.0–46.0)
Hemoglobin: 9.3 g/dL — ABNORMAL LOW (ref 12.0–15.0)
MCH: 28.2 pg (ref 26.0–34.0)
MCHC: 31.6 g/dL (ref 30.0–36.0)
MCV: 89.1 fL (ref 80.0–100.0)
Platelets: 289 10*3/uL (ref 150–400)
RBC: 3.3 MIL/uL — ABNORMAL LOW (ref 3.87–5.11)
RDW: 13.2 % (ref 11.5–15.5)
WBC: 8.5 10*3/uL (ref 4.0–10.5)
nRBC: 0 % (ref 0.0–0.2)

## 2021-04-30 LAB — BASIC METABOLIC PANEL
Anion gap: 7 (ref 5–15)
BUN: 20 mg/dL (ref 6–20)
CO2: 25 mmol/L (ref 22–32)
Calcium: 8.2 mg/dL — ABNORMAL LOW (ref 8.9–10.3)
Chloride: 104 mmol/L (ref 98–111)
Creatinine, Ser: 0.47 mg/dL (ref 0.44–1.00)
GFR, Estimated: >60 mL/min (ref >60)
Glucose, Bld: 92 mg/dL (ref 70–99)
Potassium: 4.1 mmol/L (ref 3.5–5.1)
Sodium: 136 mmol/L (ref 135–145)

## 2021-04-30 LAB — MAGNESIUM: Magnesium: 1.3 mg/dL — ABNORMAL LOW (ref 1.7–2.4)

## 2021-04-30 LAB — PHOSPHORUS: Phosphorus: 2.6 mg/dL (ref 2.5–4.6)

## 2021-04-30 LAB — GLUCOSE, CAPILLARY
Glucose-Capillary: 117 mg/dL — ABNORMAL HIGH (ref 70–99)
Glucose-Capillary: 118 mg/dL — ABNORMAL HIGH (ref 70–99)
Glucose-Capillary: 134 mg/dL — ABNORMAL HIGH (ref 70–99)
Glucose-Capillary: 97 mg/dL (ref 70–99)

## 2021-04-30 MED ORDER — MAGNESIUM SULFATE 2 GM/50ML IV SOLN
2.0000 g | Freq: Once | INTRAVENOUS | Status: AC
  Administered 2021-04-30: 15:00:00 2 g via INTRAVENOUS
  Filled 2021-04-30: qty 50

## 2021-04-30 MED ORDER — POLYETHYLENE GLYCOL 3350 17 G PO PACK: 17.0000 g | PACK | Freq: Every day | ORAL | 0 refills | Status: DC

## 2021-04-30 MED ORDER — BISACODYL 5 MG PO TBEC: 10.0000 mg | DELAYED_RELEASE_TABLET | Freq: Every evening | ORAL | 0 refills | Status: DC | PRN

## 2021-04-30 MED ORDER — MIDODRINE HCL 10 MG PO TABS
10.0000 mg | ORAL_TABLET | Freq: Three times a day (TID) | ORAL | Status: DC | PRN
Start: 2021-04-30 — End: 2023-02-09

## 2021-04-30 MED ORDER — DOXYCYCLINE HYCLATE 100 MG PO CAPS: 100.0000 mg | ORAL_CAPSULE | Freq: Two times a day (BID) | ORAL | 0 refills | Status: AC

## 2021-04-30 NOTE — Progress Notes (Signed)
Attempted report, to be called back when nurse available.
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
## 2021-04-30 NOTE — NC FL2 (Signed)
[place] MEDICAID FL2 LEVEL OF CARE SCREENING TOOL     IDENTIFICATION  Patient Name: Joanne [name] Birthdate: 1971-02-25 Sex: female Admission Date (Current Location): 04/24/2021  [facility] and [place] Number:  Chiropodist and Address:  [facility], [address] [place], [place] [address]      Provider Number:    Attending Physician Name and Address:  Gillis Santa, MD  Relative Name and Phone Number:  Midred Patterson-legal guardian [phone]    Current Level of Care: SNF Recommended Level of Care: Skilled Nursing Facility Prior Approval Number:    Date Approved/Denied:   PASRR Number: [id number]  Discharge Plan: SNF    Current Diagnoses: Patient Active Problem List   Diagnosis Date Noted   Leaking PEG tube (HCC)    Failure to thrive in adult 04/24/2021   Requires assistance with all daily activities 04/24/2021   G tube feedings (HCC) 04/24/2021   Closed injury of head 09/06/2020   Seizure disorder (HCC) 09/06/2020   Vitamin D deficiency 09/06/2020   Mild malnutrition (HCC)    Protein-calorie malnutrition, severe 05/20/2020   Weakness    Full code status    Hypokalemia 05/16/2020   Severe sepsis with septic shock (HCC) 05/15/2020   Pressure ulcer of sacral region, stage 4 (HCC)    Goals of care, counseling/discussion    Palliative care by specialist    DNR (do not resuscitate) discussion    Septic shock (HCC) 03/02/2020   Hypernatremia 11/15/2019   Thrombocytopenia (HCC) 11/15/2019   AKI (acute kidney injury) (HCC) 11/15/2019   HTN (hypertension) 11/15/2019   Non-insulin dependent type 2 diabetes mellitus (HCC) 11/15/2019   Hypothyroidism 11/15/2019   Severe sepsis with acute organ dysfunction (HCC) 11/15/2019   Left hemiplegia (HCC) 11/15/2019   Pressure injury of skin 11/15/2019   Aspiration pneumonia due to gastric secretions (HCC)    Acute kidney injury (AKI) with acute tubular necrosis (ATN) (HCC)     Traumatic brain injury (HCC)    Abdominal pain 08/05/2019   Vomiting 08/05/2019   Acute on chronic respiratory failure with hypoxia (HCC) 06/06/2018   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   Pneumonia due to Klebsiella pneumoniae (HCC) 06/06/2018   Acute on chronic combined systolic and diastolic CHF (congestive heart failure) (HCC) 06/06/2018   Sepsis secondary to UTI (HCC) 06/06/2018   ARDS (adult respiratory distress syndrome) (HCC) 06/06/2018   Acute on chronic respiratory failure with hypoxia (HCC) 06/06/2018   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   Anemia 05/20/2018   Gross hematuria 01/18/2018   Urinary retention 01/18/2018   Acute respiratory distress 10/23/2012   Cardiorespiratory arrest (HCC) 10/23/2012   Acute blood loss anemia 10/20/2012   Hypophosphatemia 10/20/2012   Leukopenia 10/20/2012   Altered mental status 10/19/2012   Duodenal obstruction 10/19/2012    Orientation RESPIRATION BLADDER Height & Weight     Self, Situation, Place  Normal Indwelling catheter Weight: 151 lb 10.8 oz (68.8 kg) Height:  5\' 4"  (162.6 cm)  BEHAVIORAL SYMPTOMS/MOOD NEUROLOGICAL BOWEL NUTRITION STATUS      Incontinent Feeding tube  AMBULATORY STATUS COMMUNICATION OF NEEDS Skin   Extensive Assist Verbally Skin abrasions (chronic sacral decubitis)                       Personal Care Assistance Level of Assistance  Bathing, Feeding, Dressing Bathing Assistance: Maximum assistance Feeding assistance: Maximum assistance Dressing Assistance: Maximum assistance     Functional Limitations Info  Sight, Hearing, Speech Sight  Info: Adequate Hearing Info: Adequate Speech Info: Adequate    SPECIAL CARE FACTORS FREQUENCY  PT (By licensed PT), OT (By licensed OT)     PT Frequency: 5x per week OT Frequency: 5x per week            Contractures Contractures Info: Not present    Additional Factors Info  Code Status Code Status Info: full code             Current  Medications (04/30/2021):  This is the current hospital active medication list Current Facility-Administered Medications  Medication Dose Route Frequency Provider Last Rate Last Admin   acetaminophen (TYLENOL) tablet 650 mg  650 mg Per Tube Q6H PRN Cox, Amy N, DO   650 mg at 04/30/21 1032   Or   acetaminophen (TYLENOL) suppository 650 mg  650 mg Rectal Q6H PRN Cox, Amy N, DO       Ampicillin-Sulbactam (UNASYN) 3 g in sodium chloride 0.9 % 100 mL IVPB  3 g Intravenous Q6H Enedina Finner, MD 200 mL/hr at 04/30/21 1332 3 g at 04/30/21 1332   bisacodyl (DULCOLAX) EC tablet 10 mg  10 mg Oral Daily Gillis Santa, MD   10 mg at 04/28/21 0910   carbamazepine (TEGRETOL) chewable tablet 50 mg  50 mg Per Tube QID Cox, Amy N, DO   50 mg at 04/30/21 1333   Chlorhexidine Gluconate Cloth 2 % PADS 6 each  6 each Topical Q0600 Cox, Amy N, DO   6 each at 04/28/21 0531   enoxaparin (LOVENOX) injection 40 mg  40 mg Subcutaneous Q24H Cox, Amy N, DO   40 mg at 04/29/21 2052   feeding supplement (JEVITY 1.5 CAL/FIBER) liquid 1,000 mL  1,000 mL Per Tube Continuous Gillis Santa, MD 50 mL/hr at 04/29/21 1707 1,000 mL at 04/29/21 1707   feeding supplement (PROSource TF) liquid 45 mL  45 mL Per Tube BID Enedina Finner, MD   45 mL at 04/30/21 1036   free water 150 mL  150 mL Per Tube Q4H Enedina Finner, MD   150 mL at 04/30/21 1147   gabapentin (NEURONTIN) capsule 100 mg  100 mg Oral Daily Cox, Amy N, DO   100 mg at 04/30/21 1032   levothyroxine (SYNTHROID) tablet 88 mcg  88 mcg Per Tube Q0600 Cox, Amy N, DO   88 mcg at 04/30/21 0547   magnesium sulfate IVPB 2 g 50 mL  2 g Intravenous Once Foye Deer, RPH       midodrine (PROAMATINE) tablet 10 mg  10 mg Per Tube TID PRN Gillis Santa, MD       nutrition supplement (JUVEN) (JUVEN) powder packet 1 packet  1 packet Per Tube BID BM Enedina Finner, MD   1 packet at 04/30/21 1033   ondansetron (ZOFRAN) tablet 4 mg  4 mg Per Tube Q6H PRN Cox, Amy N, DO       Or   ondansetron (ZOFRAN)  injection 4 mg  4 mg Intravenous Q6H PRN Cox, Amy N, DO       pantoprazole sodium (PROTONIX) 40 mg/20 mL oral suspension 40 mg  40 mg Per Tube Daily Enedina Finner, MD   40 mg at 04/30/21 1036   polyethylene glycol (MIRALAX / GLYCOLAX) packet 17 g  17 g Oral Daily Gillis Santa, MD   17 g at 04/28/21 0911     Discharge Medications: Please see discharge summary for a list of discharge medications.  Relevant Imaging Results:  Relevant Lab  Results:   Additional Information SS#[id number]  Verna Czech [place], [place]
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
## 2021-04-30 NOTE — TOC Transition Note (Addendum)
Transition of Care [facility]) - CM/SW Discharge Note   Patient Details  Name: Joanne [name] MRN: [id number] Date of Birth: [birth year]  [facility] CM/SW Contact:  Verna Czech [place], [place] Phone Number:[phone] 04/30/2021, 2:46 PM   Clinical Narrative:    Phone call to [name]-spoke with intake coordinator Kenney Houseman to confirm patient's return. Patient to discharge back to [facility] with PT and OT services that will be arranged by the facility. Patient to be transported by EMS and will be going to room 79A once discharge summary is received. Please call report to [phone] Patient's legal guardian informed.  [address] LCSW Transition of Care [phone]         Patient Goals and CMS Choice        Discharge Placement                       Discharge Plan and Services                                     Social Determinants of Health (SDOH) Interventions     Readmission Risk Interventions Readmission Risk Prevention Plan 03/10/2020 03/03/2020  Transportation Screening Complete Complete  PCP or Specialist Appt within 3-5 Days Complete Complete  HRI or Home Care Consult Complete Complete  Social Work Consult for Recovery Care Planning/Counseling Complete -  Palliative Care Screening Not Applicable Complete  Medication Review Oceanographer) Complete Complete  Some recent data might be hidden
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
## 2021-04-30 NOTE — Progress Notes (Signed)
IV removed, foley in place, PEG flushed and unhooked from pump and intact. PT belongings collected. Pt assisted with transport to EMS stretcher. Still waiting callback from facility.

## 2021-05-11 ENCOUNTER — Emergency Department: Payer: Medicare Other

## 2021-05-11 ENCOUNTER — Emergency Department
Admission: EM | Admit: 2021-05-11 | Discharge: 2021-05-11 | Disposition: A | Discharge status: Home / Self Care | Payer: Medicare Other | Attending: Emergency Medicine | Admitting: Emergency Medicine

## 2021-05-11 DIAGNOSIS — I5043 Acute on chronic combined systolic (congestive) and diastolic (congestive) heart failure: Secondary | ICD-10-CM | POA: Diagnosis not present

## 2021-05-11 DIAGNOSIS — E039 Hypothyroidism, unspecified: Secondary | ICD-10-CM | POA: Diagnosis not present

## 2021-05-11 DIAGNOSIS — Z79899 Other long term (current) drug therapy: Secondary | ICD-10-CM | POA: Insufficient documentation

## 2021-05-11 DIAGNOSIS — E119 Type 2 diabetes mellitus without complications: Secondary | ICD-10-CM | POA: Insufficient documentation

## 2021-05-11 DIAGNOSIS — I110 Hypertensive heart disease with heart failure: Secondary | ICD-10-CM | POA: Diagnosis not present

## 2021-05-11 DIAGNOSIS — T85528A Displacement of other gastrointestinal prosthetic devices, implants and grafts, initial encounter: Secondary | ICD-10-CM

## 2021-05-11 DIAGNOSIS — K9423 Gastrostomy malfunction: Secondary | ICD-10-CM | POA: Diagnosis present

## 2021-05-11 MED ORDER — DIATRIZOATE MEGLUMINE & SODIUM 66-10 % PO SOLN
30.0000 mL | Freq: Once | ORAL | Status: AC
  Administered 2021-05-11: 30 mL

## 2021-05-11 NOTE — ED Notes (Signed)
Attempted to contact legal guardian without answer.

## 2021-05-11 NOTE — ED Provider Notes (Signed)
[facility] Emergency Department Provider Note   ____________________________________________   Event Date/Time   First MD Initiated Contact with Patient 05/11/21 0501     (approximate)  I have reviewed the triage vital signs and the nursing notes.   HISTORY  Chief Complaint G-tube dislodgment    HPI Joanne [name] is a 49 y.o. female brought to the ED via EMS from [facility] with a chief complaint of dislodged G-tube.  Staff reports G-tube dislodged greater than 3 hours ago.  No reports of fever, shortness of breath or vomiting    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   Traumatic brain injury [facility])     Patient Active Problem List   Diagnosis Date Noted   Leaking PEG tube (HCC)    Failure to thrive in adult 04/24/2021   Requires assistance with all daily activities 04/24/2021   G tube feedings (HCC) 04/24/2021   Closed injury of head 09/06/2020   Seizure disorder (HCC) 09/06/2020   Vitamin D deficiency 09/06/2020   Mild malnutrition (HCC)    Protein-calorie malnutrition, severe 05/20/2020   Weakness    Full code status    Hypokalemia 05/16/2020   Severe sepsis with septic shock (HCC) 05/15/2020   Pressure ulcer of sacral region, stage 4 (HCC)    Goals of care, counseling/discussion    Palliative care by specialist    DNR (do not resuscitate)  discussion    Septic shock (HCC) 03/02/2020   Hypernatremia 11/15/2019   Thrombocytopenia (HCC) 11/15/2019   AKI (acute kidney injury) (HCC) 11/15/2019   HTN (hypertension) 11/15/2019   Non-insulin dependent type 2 diabetes mellitus (HCC) 11/15/2019   Hypothyroidism 11/15/2019   Severe sepsis with acute organ dysfunction (HCC) 11/15/2019   Left hemiplegia (HCC) 11/15/2019   Pressure injury of skin 11/15/2019   Aspiration pneumonia due to gastric secretions (HCC)    Acute kidney injury (AKI) with acute tubular necrosis (ATN) (HCC)    Traumatic brain injury (HCC)    Abdominal pain 08/05/2019   Vomiting 08/05/2019   Acute on chronic respiratory failure with hypoxia (HCC) 06/06/2018   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   Pneumonia due to Klebsiella pneumoniae (HCC) 06/06/2018   Acute on chronic combined systolic and diastolic CHF (congestive heart failure) (HCC) 06/06/2018   Sepsis secondary to UTI (HCC) 06/06/2018   ARDS (adult respiratory distress syndrome) (HCC) 06/06/2018   Acute on chronic respiratory failure with hypoxia (HCC) 06/06/2018   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   Anemia 05/20/2018   Gross hematuria 01/18/2018   Urinary retention 01/18/2018   Acute respiratory distress 10/23/2012   Cardiorespiratory arrest (HCC) 10/23/2012   Acute blood loss anemia 10/20/2012   Hypophosphatemia 10/20/2012   Leukopenia 10/20/2012   Altered mental status 10/19/2012   Duodenal obstruction 10/19/2012    Past Surgical History:  Procedure Laterality Date   PEG PLACEMENT N/A 05/27/2020   Procedure: PERCUTANEOUS  ENDOSCOPIC GASTROSTOMY (PEG) PLACEMENT;  Surgeon: Midge MiniumWohl, Darren, MD;  [facility];  Service: Endoscopy;  Laterality: N/A;   TRACHEOSTOMY      Prior to Admission medications   Medication Sig Start Date End Date Taking? Authorizing Provider  acetaminophen (TYLENOL) 325 MG tablet Take 650 mg by mouth every 8 (eight) hours as needed for mild pain or moderate pain.     [provider]  baclofen (LIORESAL) 10 MG tablet Take 10 mg by mouth 3 (three) times daily. 07/19/20   [provider]  bisacodyl (DULCOLAX) 5 MG EC tablet Take 2 tablets (10 mg total) by mouth at bedtime as needed for moderate constipation. 04/30/21   Gillis SantaKumar, Dileep, MD  carbamazepine (TEGRETOL XR) 100 MG 12 hr tablet Take 50 mg by mouth 4 (four) times daily.    [provider]  ferrous sulfate 325 (65 FE) MG tablet Take 325 mg by mouth daily with breakfast.    [provider]  gabapentin (NEURONTIN) 100 MG capsule Take 100 mg by mouth 2 (two) times daily.    [provider]  levothyroxine (SYNTHROID) 100 MCG tablet Take 100 mcg by mouth daily before breakfast.    [provider]  midodrine (PROAMATINE) 10 MG tablet Place 1 tablet (10 mg total) into feeding tube 3 (three) times daily as needed (if SBP < 100). 04/30/21   Gillis SantaKumar, Dileep, MD  oxycodone (OXY-IR) 5 MG capsule Take 5 mg by mouth 3 (three) times daily.    [provider]  polyethylene glycol (MIRALAX / GLYCOLAX) 17 g packet Take 17 g by mouth daily. 05/01/21   Gillis SantaKumar, Dileep, MD    Allergies Kaopectate  [attapulgite], Thiopental, Gatifloxacin, Ceftin [cefuroxime], and Tomato  Family History  Family history unknown: Yes    Social History Social History   Tobacco Use   Smoking status: Never   Smokeless tobacco: Never  Substance Use Topics   Alcohol use: Not Currently   Drug use: Not Currently    Review of Systems  Constitutional: No fever/chills Eyes: No visual changes. ENT: No sore throat. Cardiovascular: Denies chest pain. Respiratory: Denies shortness of breath. Gastrointestinal: Positive for dislodged G-tube.  No abdominal pain.  No nausea, no vomiting.  No diarrhea.  No constipation. Genitourinary: Negative for dysuria. Musculoskeletal: Negative for back pain. Skin: Negative for rash. Neurological: Negative for headaches, focal weakness or  numbness.   ____________________________________________   PHYSICAL EXAM:  VITAL SIGNS: ED Triage Vitals  Enc Vitals Group     BP      Pulse      Resp      Temp      Temp src      SpO2      Weight      Height      Head Circumference      Peak Flow      Pain Score      Pain Loc      Pain Edu?      Excl. in GC?     Constitutional: Alert and oriented.  Chronically ill appearing and in no acute distress. Eyes: Conjunctivae are normal. PERRL. EOMI. Head: Atraumatic. Nose: No congestion/rhinnorhea. Mouth/Throat: Mucous membranes are mildly dry. Neck: No stridor.   Cardiovascular: Normal rate, regular rhythm. Grossly normal heart sounds.  Good peripheral circulation. Respiratory: Normal respiratory effort.  No retractions. Lungs CTAB. Gastrointestinal: Soft and nontender to light or deep palpation.  Easily reducible ventral hernia.  Dislodged right-sided G-tube; stoma intact. No distention. No  abdominal bruits. No CVA tenderness. Musculoskeletal: No lower extremity tenderness nor edema.  No joint effusions. Neurologic: Baseline dysarthric speech and language.  Contractures.  No gross focal neurologic deficits are appreciated.  Able to have simple conversation. Skin:  Skin is warm, dry and intact. No rash noted. Psychiatric: Mood and affect are normal. Speech and behavior are normal.  ____________________________________________   LABS (all labs ordered are listed, but only abnormal results are displayed)  Labs Reviewed - No data to display ____________________________________________  EKG  None ____________________________________________  RADIOLOGY I, Dietrich Samuelson J, personally viewed and evaluated these images (plain radiographs) as part of my medical decision making, as well as reviewing the written report by the radiologist.  ED MD interpretation:  Suboptimal KUB; repeat KUB pending  Official radiology report(s): DG Abdomen 1 View  Result Date:  05/11/2021 CLINICAL DATA:  Gastrografin evaluation of G-tube. EXAM: ABDOMEN - 1 VIEW COMPARISON:  08/13/2020. FINDINGS: Suboptimal exam due to positioning. Patient is rotated severely to the right. Gastrostomy tube and Gastrografin difficult to visualize. What may represent Gastrografin is present projected over the right upper abdomen. Again the patient is severely rotated to the right. Surgical clips and sutures noted over the abdomen. Nondistended air-filled loops of small bowel noted. Stool noted throughout the colon. No free air. Severe thoracolumbar spine scoliosis. Postsurgical changes left hip. IMPRESSION: Very limited suboptimal exam with gastrostomy tube and Gastrografin difficult to visualize as above. Electronically Signed   By: Maisie Fus  Register   On: 05/11/2021 06:56    ____________________________________________   PROCEDURES  Procedure(s) performed (including Critical Care):  Gastrostomy tube replacement  Date/Time: 05/11/2021 6:15 AM Performed by: Irean Hong, MD Authorized by: Irean Hong, MD  Consent: Verbal consent obtained. Risks and benefits: risks, benefits and alternatives were discussed Consent given by: patient Patient identity confirmed: arm band Time out: Immediately prior to procedure a "time out" was called to verify the correct patient, procedure, equipment, support staff and site/side marked as required. Preparation: Patient was prepped and draped in the usual sterile fashion. Local anesthesia used: no  Anesthesia: Local anesthesia used: no  Sedation: Patient sedated: no  Patient tolerance: patient tolerated the procedure well with no immediate complications Comments: 18Fr G tube placed without difficulty   ____________________________________________   INITIAL IMPRESSION / ASSESSMENT AND PLAN / ED COURSE  As part of my medical decision making, I reviewed the following data within the electronic MEDICAL RECORD NUMBER Nursing notes reviewed and  incorporated, Old chart reviewed, Radiograph reviewed, and Notes from prior ED visits     50 year old female coming from SNF with dislodged G-tube.  SNF did not send patient's tube with her.  I personally reviewed patient's old chart and cannot find what size G-tube she uses.  Clinical Course as of 05/11/21 1610  Wed May 11, 2021  0622 X-ray tech informed me she was not able to push Gastrografin via G-tube.  Nurse and I examined G-tube position and were able to easily push 60cc NS without difficulty. [JS]  [id number] Care transferred to Dr. Scotty Court pending repeat KUB; Gastrograffin to be pushed by MD or RN.  Anticipate G-tube will be in good position and patient may be discharged back to her facility. [JS]    Clinical Course User Index [JS] Irean Hong, MD     ____________________________________________   FINAL CLINICAL IMPRESSION(S) / ED DIAGNOSES  Final diagnoses:  Dislodged gastrostomy tube     ED Discharge Orders     None  Note:  This document was prepared using Dragon voice recognition software and may include unintentional dictation errors.    Irean Hong, MD 05/11/21 [phone]

## 2021-05-11 NOTE — ED Triage Notes (Signed)
Pt is from [facility] ,  Where staff stats here feeding tube came  Came out .   Pt appears stable , vitals as noted in chart . ABCs appear wnl

## 2021-05-11 NOTE — ED Provider Notes (Signed)
Procedures  Clinical Course as of 05/11/21 0935  Wed May 11, 2021  0622 X-ray tech informed me she was not able to push Gastrografin via G-tube.  Nurse and I examined G-tube position and were able to easily push 60cc NS without difficulty. [JS]  [id number] Care transferred to Dr. Scotty Court pending repeat KUB; Gastrograffin to be pushed by MD or RN.  Anticipate G-tube will be in good position and patient may be discharged back to her facility. [JS]    Clinical Course User Index [JS] Irean Hong, MD    ----------------------------------------- 9:35 AM on 05/11/2021 -----------------------------------------  Repeat x-ray obtained showing opacification of stomach and proximal small bowel.  Patient is calm, pain-free, stable for discharge    Sharman Cheek, MD 05/11/21 [phone]

## 2021-05-11 NOTE — Discharge Instructions (Signed)
Return to the ER for worsening symptoms, persistent vomiting, difficulty breathing or other concerns. °

## 2021-05-11 NOTE — ED Notes (Addendum)
Legal guardian notified of patient being discharged going back to [facility].

## 2021-07-04 ENCOUNTER — Inpatient Hospital Stay: Payer: Medicare Other

## 2021-07-04 ENCOUNTER — Inpatient Hospital Stay
Admission: EM | Admit: 2021-07-04 | Discharge: 2021-07-12 | DRG: 871 | Disposition: A | Discharge status: Skilled Nursing Facility | Payer: Medicare Other | Attending: Internal Medicine | Admitting: Internal Medicine

## 2021-07-04 ENCOUNTER — Emergency Department: Payer: Medicare Other

## 2021-07-04 ENCOUNTER — Encounter: Payer: Self-pay | Admitting: Emergency Medicine

## 2021-07-04 ENCOUNTER — Other Ambulatory Visit: Payer: Self-pay

## 2021-07-04 DIAGNOSIS — Z881 Allergy status to other antibiotic agents status: Secondary | ICD-10-CM

## 2021-07-04 DIAGNOSIS — K9423 Gastrostomy malfunction: Secondary | ICD-10-CM | POA: Diagnosis not present

## 2021-07-04 DIAGNOSIS — G8929 Other chronic pain: Secondary | ICD-10-CM | POA: Diagnosis present

## 2021-07-04 DIAGNOSIS — L89123 Pressure ulcer of left upper back, stage 3: Secondary | ICD-10-CM | POA: Diagnosis present

## 2021-07-04 DIAGNOSIS — L89103 Pressure ulcer of unspecified part of back, stage 3: Secondary | ICD-10-CM

## 2021-07-04 DIAGNOSIS — L89154 Pressure ulcer of sacral region, stage 4: Secondary | ICD-10-CM | POA: Diagnosis present

## 2021-07-04 DIAGNOSIS — I5042 Chronic combined systolic (congestive) and diastolic (congestive) heart failure: Secondary | ICD-10-CM | POA: Diagnosis present

## 2021-07-04 DIAGNOSIS — I5032 Chronic diastolic (congestive) heart failure: Secondary | ICD-10-CM | POA: Diagnosis not present

## 2021-07-04 DIAGNOSIS — Z978 Presence of other specified devices: Secondary | ICD-10-CM | POA: Diagnosis not present

## 2021-07-04 DIAGNOSIS — R64 Cachexia: Secondary | ICD-10-CM | POA: Diagnosis present

## 2021-07-04 DIAGNOSIS — E039 Hypothyroidism, unspecified: Secondary | ICD-10-CM | POA: Diagnosis present

## 2021-07-04 DIAGNOSIS — G8250 Quadriplegia, unspecified: Secondary | ICD-10-CM | POA: Diagnosis present

## 2021-07-04 DIAGNOSIS — R627 Adult failure to thrive: Secondary | ICD-10-CM | POA: Diagnosis present

## 2021-07-04 DIAGNOSIS — S069XAA Unspecified intracranial injury with loss of consciousness status unknown, initial encounter: Secondary | ICD-10-CM | POA: Diagnosis present

## 2021-07-04 DIAGNOSIS — D509 Iron deficiency anemia, unspecified: Secondary | ICD-10-CM | POA: Diagnosis present

## 2021-07-04 DIAGNOSIS — E875 Hyperkalemia: Secondary | ICD-10-CM | POA: Diagnosis present

## 2021-07-04 DIAGNOSIS — R338 Other retention of urine: Secondary | ICD-10-CM | POA: Diagnosis present

## 2021-07-04 DIAGNOSIS — Z20822 Contact with and (suspected) exposure to covid-19: Secondary | ICD-10-CM | POA: Diagnosis present

## 2021-07-04 DIAGNOSIS — K5900 Constipation, unspecified: Secondary | ICD-10-CM | POA: Diagnosis not present

## 2021-07-04 DIAGNOSIS — S069X9A Unspecified intracranial injury with loss of consciousness of unspecified duration, initial encounter: Secondary | ICD-10-CM | POA: Diagnosis present

## 2021-07-04 DIAGNOSIS — Z931 Gastrostomy status: Secondary | ICD-10-CM

## 2021-07-04 DIAGNOSIS — K439 Ventral hernia without obstruction or gangrene: Secondary | ICD-10-CM | POA: Diagnosis present

## 2021-07-04 DIAGNOSIS — Z79890 Hormone replacement therapy (postmenopausal): Secondary | ICD-10-CM

## 2021-07-04 DIAGNOSIS — M21371 Foot drop, right foot: Secondary | ICD-10-CM | POA: Diagnosis present

## 2021-07-04 DIAGNOSIS — R6521 Severe sepsis with septic shock: Secondary | ICD-10-CM | POA: Diagnosis present

## 2021-07-04 DIAGNOSIS — N319 Neuromuscular dysfunction of bladder, unspecified: Secondary | ICD-10-CM | POA: Diagnosis present

## 2021-07-04 DIAGNOSIS — N179 Acute kidney failure, unspecified: Secondary | ICD-10-CM | POA: Diagnosis present

## 2021-07-04 DIAGNOSIS — K56609 Unspecified intestinal obstruction, unspecified as to partial versus complete obstruction: Secondary | ICD-10-CM | POA: Diagnosis present

## 2021-07-04 DIAGNOSIS — G9341 Metabolic encephalopathy: Secondary | ICD-10-CM | POA: Diagnosis present

## 2021-07-04 DIAGNOSIS — J189 Pneumonia, unspecified organism: Secondary | ICD-10-CM | POA: Diagnosis not present

## 2021-07-04 DIAGNOSIS — Z888 Allergy status to other drugs, medicaments and biological substances status: Secondary | ICD-10-CM

## 2021-07-04 DIAGNOSIS — Z87820 Personal history of traumatic brain injury: Secondary | ICD-10-CM

## 2021-07-04 DIAGNOSIS — R6520 Severe sepsis without septic shock: Secondary | ICD-10-CM

## 2021-07-04 DIAGNOSIS — E1129 Type 2 diabetes mellitus with other diabetic kidney complication: Secondary | ICD-10-CM | POA: Diagnosis present

## 2021-07-04 DIAGNOSIS — A4159 Other Gram-negative sepsis: Principal | ICD-10-CM | POA: Diagnosis present

## 2021-07-04 DIAGNOSIS — Z79899 Other long term (current) drug therapy: Secondary | ICD-10-CM

## 2021-07-04 DIAGNOSIS — R7881 Bacteremia: Secondary | ICD-10-CM | POA: Diagnosis not present

## 2021-07-04 DIAGNOSIS — I110 Hypertensive heart disease with heart failure: Secondary | ICD-10-CM | POA: Diagnosis present

## 2021-07-04 DIAGNOSIS — Z91018 Allergy to other foods: Secondary | ICD-10-CM

## 2021-07-04 DIAGNOSIS — J690 Pneumonitis due to inhalation of food and vomit: Secondary | ICD-10-CM | POA: Diagnosis present

## 2021-07-04 DIAGNOSIS — E860 Dehydration: Secondary | ICD-10-CM | POA: Diagnosis present

## 2021-07-04 DIAGNOSIS — R1310 Dysphagia, unspecified: Secondary | ICD-10-CM | POA: Diagnosis present

## 2021-07-04 DIAGNOSIS — G40909 Epilepsy, unspecified, not intractable, without status epilepticus: Secondary | ICD-10-CM

## 2021-07-04 DIAGNOSIS — Z789 Other specified health status: Secondary | ICD-10-CM

## 2021-07-04 DIAGNOSIS — Z79891 Long term (current) use of opiate analgesic: Secondary | ICD-10-CM

## 2021-07-04 DIAGNOSIS — Z930 Tracheostomy status: Secondary | ICD-10-CM

## 2021-07-04 DIAGNOSIS — Z7401 Bed confinement status: Secondary | ICD-10-CM

## 2021-07-04 DIAGNOSIS — S21209A Unspecified open wound of unspecified back wall of thorax without penetration into thoracic cavity, initial encounter: Secondary | ICD-10-CM | POA: Diagnosis present

## 2021-07-04 DIAGNOSIS — M21372 Foot drop, left foot: Secondary | ICD-10-CM | POA: Diagnosis present

## 2021-07-04 DIAGNOSIS — A419 Sepsis, unspecified organism: Secondary | ICD-10-CM | POA: Diagnosis not present

## 2021-07-04 DIAGNOSIS — Z6825 Body mass index (BMI) 25.0-25.9, adult: Secondary | ICD-10-CM

## 2021-07-04 LAB — COMPREHENSIVE METABOLIC PANEL
ALT: 35 U/L (ref 0–44)
AST: 61 U/L — ABNORMAL HIGH (ref 15–41)
Albumin: 2.9 g/dL — ABNORMAL LOW (ref 3.5–5.0)
Alkaline Phosphatase: 147 U/L — ABNORMAL HIGH (ref 38–126)
Anion gap: 13 (ref 5–15)
BUN: 65 mg/dL — ABNORMAL HIGH (ref 6–20)
CO2: 24 mmol/L (ref 22–32)
Calcium: 9.4 mg/dL (ref 8.9–10.3)
Chloride: 96 mmol/L — ABNORMAL LOW (ref 98–111)
Creatinine, Ser: 2.68 mg/dL — ABNORMAL HIGH (ref 0.44–1.00)
GFR, Estimated: 21 mL/min — ABNORMAL LOW (ref >60)
Glucose, Bld: 178 mg/dL — ABNORMAL HIGH (ref 70–99)
Potassium: 6.6 mmol/L (ref 3.5–5.1)
Sodium: 133 mmol/L — ABNORMAL LOW (ref 135–145)
Total Bilirubin: 0.7 mg/dL (ref 0.3–1.2)
Total Protein: 8.7 g/dL — ABNORMAL HIGH (ref 6.5–8.1)

## 2021-07-04 LAB — CBC WITH DIFFERENTIAL/PLATELET
Abs Immature Granulocytes: 0.27 10*3/uL — ABNORMAL HIGH (ref 0.00–0.07)
Basophils Absolute: 0.1 10*3/uL (ref 0.0–0.1)
Basophils Relative: 0 %
Eosinophils Absolute: 0.1 10*3/uL (ref 0.0–0.5)
Eosinophils Relative: 0 %
HCT: 41.8 % (ref 36.0–46.0)
Hemoglobin: 13.8 g/dL (ref 12.0–15.0)
Immature Granulocytes: 1 %
Lymphocytes Relative: 3 %
Lymphs Abs: 0.7 10*3/uL (ref 0.7–4.0)
MCH: 28.2 pg (ref 26.0–34.0)
MCHC: 33 g/dL (ref 30.0–36.0)
MCV: 85.5 fL (ref 80.0–100.0)
Monocytes Absolute: 1.7 10*3/uL — ABNORMAL HIGH (ref 0.1–1.0)
Monocytes Relative: 7 %
Neutro Abs: 21.4 10*3/uL — ABNORMAL HIGH (ref 1.7–7.7)
Neutrophils Relative %: 89 %
Platelets: 298 10*3/uL (ref 150–400)
RBC: 4.89 MIL/uL (ref 3.87–5.11)
RDW: 14.8 % (ref 11.5–15.5)
Smear Review: NORMAL
WBC: 24.2 10*3/uL — ABNORMAL HIGH (ref 4.0–10.5)
nRBC: 0 % (ref 0.0–0.2)

## 2021-07-04 LAB — TROPONIN I (HIGH SENSITIVITY): Troponin I (High Sensitivity): 21 ng/L — ABNORMAL HIGH (ref <18)

## 2021-07-04 LAB — BLOOD GAS, VENOUS
Acid-base deficit: 1.6 mmol/L (ref 0.0–2.0)
Bicarbonate: 25.5 mmol/L (ref 20.0–28.0)
O2 Saturation: 74.8 %
Patient temperature: 37
pCO2, Ven: 53 mmHg (ref 44.0–60.0)
pH, Ven: 7.29 (ref 7.250–7.430)
pO2, Ven: 45 mmHg (ref 32.0–45.0)

## 2021-07-04 LAB — RESP PANEL BY RT-PCR (FLU A&B, COVID) ARPGX2
Influenza A by PCR: NEGATIVE
Influenza B by PCR: NEGATIVE
SARS Coronavirus 2 by RT PCR: NEGATIVE

## 2021-07-04 LAB — PROTIME-INR
INR: 1.1 (ref 0.8–1.2)
Prothrombin Time: 14.5 seconds (ref 11.4–15.2)

## 2021-07-04 LAB — LACTIC ACID, PLASMA
Lactic Acid, Venous: 3.2 mmol/L (ref 0.5–1.9)
Lactic Acid, Venous: 3.6 mmol/L (ref 0.5–1.9)
Lactic Acid, Venous: 3.7 mmol/L (ref 0.5–1.9)
Lactic Acid, Venous: 2.6 mmol/L (ref 0.5–1.9)

## 2021-07-04 LAB — APTT: aPTT: 32 seconds (ref 24–36)

## 2021-07-04 LAB — GLUCOSE, CAPILLARY
Glucose-Capillary: 96 mg/dL (ref 70–99)
Glucose-Capillary: 136 mg/dL — ABNORMAL HIGH (ref 70–99)

## 2021-07-04 LAB — PROCALCITONIN: Procalcitonin: 18.05 ng/mL

## 2021-07-04 LAB — BRAIN NATRIURETIC PEPTIDE: B Natriuretic Peptide: 90 pg/mL (ref 0.0–100.0)

## 2021-07-04 LAB — MRSA NEXT GEN BY PCR, NASAL: MRSA by PCR Next Gen: NOT DETECTED

## 2021-07-04 MED ORDER — NOREPINEPHRINE 4 MG/250ML-% IV SOLN
0.0000 ug/min | INTRAVENOUS | Status: DC
  Administered 2021-07-04: 2 ug/min via INTRAVENOUS
  Administered 2021-07-04: 8 ug/min via INTRAVENOUS
  Filled 2021-07-04 (×2): qty 250

## 2021-07-04 MED ORDER — INSULIN ASPART 100 UNIT/ML IJ SOLN
10.0000 [IU] | Freq: Once | INTRAMUSCULAR | Status: AC
  Administered 2021-07-04: 10 [IU] via INTRAVENOUS
  Filled 2021-07-04: qty 1

## 2021-07-04 MED ORDER — ONDANSETRON HCL 4 MG/2ML IJ SOLN: 4.0000 mg | Freq: Three times a day (TID) | INTRAMUSCULAR | Status: DC | PRN

## 2021-07-04 MED ORDER — VANCOMYCIN HCL IN DEXTROSE 1-5 GM/200ML-% IV SOLN
1000.0000 mg | Freq: Once | INTRAVENOUS | Status: AC
  Administered 2021-07-04: 1000 mg via INTRAVENOUS
  Filled 2021-07-04: qty 200

## 2021-07-04 MED ORDER — GABAPENTIN 100 MG PO CAPS: 100.0000 mg | ORAL_CAPSULE | Freq: Two times a day (BID) | ORAL | Status: DC

## 2021-07-04 MED ORDER — CALCIUM GLUCONATE 10 % IV SOLN
1.0000 g | Freq: Once | INTRAVENOUS | Status: AC
  Administered 2021-07-04: 1 g via INTRAVENOUS
  Filled 2021-07-04: qty 10

## 2021-07-04 MED ORDER — LACTATED RINGERS IV BOLUS
1000.0000 mL | Freq: Once | INTRAVENOUS | Status: AC
  Administered 2021-07-04: 1000 mL via INTRAVENOUS

## 2021-07-04 MED ORDER — MIDODRINE HCL 5 MG PO TABS: 10.0000 mg | ORAL_TABLET | Freq: Three times a day (TID) | ORAL | Status: DC

## 2021-07-04 MED ORDER — OXYCODONE HCL 5 MG PO TABS: 5.0000 mg | ORAL_TABLET | Freq: Three times a day (TID) | ORAL | Status: DC

## 2021-07-04 MED ORDER — SODIUM CHLORIDE 0.9 % IV BOLUS (SEPSIS)
1000.0000 mL | Freq: Once | INTRAVENOUS | Status: AC
  Administered 2021-07-04: 1000 mL via INTRAVENOUS

## 2021-07-04 MED ORDER — DEXTROSE 50 % IV SOLN
1.0000 | Freq: Once | INTRAVENOUS | Status: AC
  Administered 2021-07-04: 50 mL via INTRAVENOUS
  Filled 2021-07-04: qty 50

## 2021-07-04 MED ORDER — BACLOFEN 1 MG/ML ORAL SUSPENSION
2.5000 mg | Freq: Three times a day (TID) | ORAL | Status: DC
  Administered 2021-07-05 – 2021-07-12 (×17): 2.5 mg
  Filled 2021-07-04 (×30): qty 2.5

## 2021-07-04 MED ORDER — SODIUM ZIRCONIUM CYCLOSILICATE 10 G PO PACK
10.0000 g | PACK | ORAL | Status: AC
  Administered 2021-07-04: 10 g
  Filled 2021-07-04: qty 1

## 2021-07-04 MED ORDER — POLYETHYLENE GLYCOL 3350 17 G PO PACK: 17.0000 g | PACK | Freq: Every day | ORAL | Status: DC

## 2021-07-04 MED ORDER — SODIUM CHLORIDE 0.9 % IV SOLN: INTRAVENOUS | Status: DC

## 2021-07-04 MED ORDER — GUAIFENESIN 100 MG/5ML PO SYRP
200.0000 mg | ORAL_SOLUTION | ORAL | Status: DC | PRN
  Filled 2021-07-04: qty 10

## 2021-07-04 MED ORDER — HYDROCORTISONE SOD SUC (PF) 100 MG IJ SOLR
100.0000 mg | Freq: Three times a day (TID) | INTRAMUSCULAR | Status: DC
  Administered 2021-07-04 – 2021-07-07 (×8): 100 mg via INTRAVENOUS
  Filled 2021-07-04 (×8): qty 2

## 2021-07-04 MED ORDER — BISACODYL 5 MG PO TBEC: 10.0000 mg | DELAYED_RELEASE_TABLET | Freq: Every evening | ORAL | Status: DC | PRN

## 2021-07-04 MED ORDER — OSMOLITE 1.2 CAL PO LIQD: 1000.0000 mL | ORAL | Status: DC

## 2021-07-04 MED ORDER — SODIUM CHLORIDE 0.9 % IV SOLN
2.0000 g | Freq: Once | INTRAVENOUS | Status: AC
  Administered 2021-07-04: 2 g via INTRAVENOUS
  Filled 2021-07-04: qty 2

## 2021-07-04 MED ORDER — CHLORHEXIDINE GLUCONATE 0.12 % MT SOLN
15.0000 mL | Freq: Two times a day (BID) | OROMUCOSAL | Status: DC
  Administered 2021-07-04 – 2021-07-12 (×16): 15 mL via OROMUCOSAL
  Filled 2021-07-04 (×12): qty 15

## 2021-07-04 MED ORDER — SODIUM CHLORIDE 0.9 % IV SOLN
2.0000 g | INTRAVENOUS | Status: DC
  Administered 2021-07-04: 2 g via INTRAVENOUS
  Filled 2021-07-04 (×2): qty 2

## 2021-07-04 MED ORDER — CARBAMAZEPINE ER 100 MG PO TB12
100.0000 mg | ORAL_TABLET | Freq: Two times a day (BID) | ORAL | Status: DC
  Filled 2021-07-04 (×3): qty 1

## 2021-07-04 MED ORDER — METRONIDAZOLE 500 MG/100ML IV SOLN
500.0000 mg | Freq: Two times a day (BID) | INTRAVENOUS | Status: DC
  Administered 2021-07-04 (×2): 500 mg via INTRAVENOUS
  Filled 2021-07-04 (×4): qty 100

## 2021-07-04 MED ORDER — HEPARIN SODIUM (PORCINE) 5000 UNIT/ML IJ SOLN
5000.0000 [IU] | Freq: Three times a day (TID) | INTRAMUSCULAR | Status: DC
  Administered 2021-07-04 – 2021-07-11 (×20): 5000 [IU] via SUBCUTANEOUS
  Filled 2021-07-04 (×20): qty 1

## 2021-07-04 MED ORDER — VANCOMYCIN HCL 500 MG/100ML IV SOLN
500.0000 mg | Freq: Once | INTRAVENOUS | Status: AC
  Administered 2021-07-04: 500 mg via INTRAVENOUS
  Filled 2021-07-04: qty 100

## 2021-07-04 MED ORDER — SODIUM BICARBONATE 8.4 % IV SOLN
50.0000 meq | Freq: Once | INTRAVENOUS | Status: AC
  Administered 2021-07-04: 50 meq via INTRAVENOUS
  Filled 2021-07-04: qty 50

## 2021-07-04 MED ORDER — VANCOMYCIN VARIABLE DOSE PER UNSTABLE RENAL FUNCTION (PHARMACIST DOSING): Status: DC

## 2021-07-04 MED ORDER — ORAL CARE MOUTH RINSE
15.0000 mL | Freq: Two times a day (BID) | OROMUCOSAL | Status: DC
  Administered 2021-07-04 – 2021-07-12 (×15): 15 mL via OROMUCOSAL

## 2021-07-04 MED ORDER — ACETAMINOPHEN 325 MG PO TABS: 650.0000 mg | ORAL_TABLET | Freq: Four times a day (QID) | ORAL | Status: DC | PRN

## 2021-07-04 MED ORDER — LEVOTHYROXINE SODIUM 50 MCG PO TABS: 100.0000 ug | ORAL_TABLET | Freq: Every day | ORAL | Status: DC

## 2021-07-04 MED ORDER — FERROUS SULFATE 325 (65 FE) MG PO TABS: 325.0000 mg | ORAL_TABLET | Freq: Every day | ORAL | Status: DC

## 2021-07-04 MED ORDER — CHLORHEXIDINE GLUCONATE CLOTH 2 % EX PADS
6.0000 | MEDICATED_PAD | Freq: Every day | CUTANEOUS | Status: DC
  Administered 2021-07-04 – 2021-07-12 (×6): 6 via TOPICAL

## 2021-07-04 MED ORDER — LORAZEPAM 2 MG/ML IJ SOLN: 1.0000 mg | INTRAMUSCULAR | Status: DC | PRN

## 2021-07-04 MED ORDER — ALBUTEROL SULFATE (2.5 MG/3ML) 0.083% IN NEBU: 2.5000 mg | INHALATION_SOLUTION | RESPIRATORY_TRACT | Status: DC | PRN

## 2021-07-04 NOTE — ED Provider Notes (Signed)
[facility] Emergency Department Provider Note  ____________________________________________  Time seen: Approximately 10:22 AM  I have reviewed the triage vital signs and the nursing notes.   HISTORY  Chief Complaint Altered Mental Status    Level 5 Caveat: Portions of the History and Physical including HPI and review of systems are unable to be completely obtained due to altered mental status HPI Joanne [name] is a 50 y.o. 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  Reportedly she was at her baseline mental status yesterday.  It was also noted this morning that there was no urine output in her Foley bag.  She has a history of aspiration pneumonia and sepsis  Past Medical History:  Diagnosis Date  . Acute kidney injury (AKI) with acute tubular necrosis (ATN) (HCC)   . Acute on chronic combined systolic and diastolic CHF (congestive heart failure) (HCC) 06/06/2018  . Acute on chronic respiratory failure with hypoxia (HCC) 06/06/2018  . ARDS (adult respiratory distress syndrome) (HCC) 06/06/2018  . Aspiration pneumonia due to gastric secretions (HCC)   . Diabetes mellitus (HCC)   . History of traumatic brain injury   . Hypothyroidism   . Pneumonia due to Klebsiella pneumoniae (HCC) 06/06/2018  . Seizure disorder (HCC)   . Severe sepsis (HCC) 06/06/2018  . 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 Traumatic brain injury [facility])      Patient Active Problem List   Diagnosis Date Noted  . Leaking PEG tube (HCC)   . Failure to thrive in adult 04/24/2021  . Requires assistance with all daily activities 04/24/2021  . G tube feedings (HCC) 04/24/2021  . Closed injury of head 09/06/2020  . Seizure disorder (HCC) 09/06/2020  . Vitamin D deficiency 09/06/2020  . Mild malnutrition (HCC)   .  Protein-calorie malnutrition, severe 05/20/2020  . Weakness   . Full code status   . Hypokalemia 05/16/2020  . Severe sepsis with septic shock (HCC) 05/15/2020  . Pressure ulcer of sacral region, stage 4 (HCC)   . Goals of care, counseling/discussion   . Palliative care by specialist   . DNR (do not resuscitate) discussion   . Septic shock (HCC) 03/02/2020  . Hypernatremia 11/15/2019  . Thrombocytopenia (HCC) 11/15/2019  . AKI (acute kidney injury) (HCC) 11/15/2019  . HTN (hypertension) 11/15/2019  . Non-insulin dependent type 2 diabetes mellitus (HCC) 11/15/2019  . Hypothyroidism 11/15/2019  . Severe sepsis with acute organ dysfunction (HCC) 11/15/2019  . Left hemiplegia (HCC) 11/15/2019  . Pressure injury of skin 11/15/2019  . Aspiration pneumonia due to gastric secretions (HCC)   . Acute kidney injury (AKI) with acute tubular necrosis (ATN) (HCC)   . Traumatic brain injury (HCC)   . Abdominal pain 08/05/2019  . Vomiting 08/05/2019  . Acute on chronic respiratory failure with hypoxia (HCC) 06/06/2018  . 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 Pneumonia due to Klebsiella pneumoniae (HCC) 06/06/2018  . Acute on chronic combined systolic and diastolic CHF (congestive heart failure) (HCC) 06/06/2018  . Sepsis secondary to UTI (HCC) 06/06/2018  . ARDS (adult respiratory distress syndrome) (HCC) 06/06/2018  . Acute on chronic respiratory failure with hypoxia (HCC) 06/06/2018  . 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 Anemia 05/20/2018  . Gross hematuria 01/18/2018  . Urinary retention 01/18/2018  . Acute respiratory distress 10/23/2012  . Cardiorespiratory arrest (HCC) 10/23/2012  .  Acute blood loss anemia 10/20/2012  . Hypophosphatemia 10/20/2012  . Leukopenia 10/20/2012  . Altered mental status 10/19/2012  . Duodenal obstruction 10/19/2012     Past Surgical History:  Procedure Laterality Date  . PEG PLACEMENT N/A 05/27/2020   Procedure: PERCUTANEOUS ENDOSCOPIC GASTROSTOMY  (PEG) PLACEMENT;  Surgeon: Midge Minium, MD;  Location: [facility] ENDOSCOPY;  Service: Endoscopy;  Laterality: N/A;  . TRACHEOSTOMY       Prior to Admission medications   Medication Sig Start Date End Date Taking? Authorizing Provider  acetaminophen (TYLENOL) 325 MG tablet Take 650 mg by mouth every 8 (eight) hours as needed for mild pain or moderate pain.   Yes [provider]  baclofen (LIORESAL) 10 MG tablet Take 10 mg by mouth 3 (three) times daily. 07/19/20  Yes [provider]  bisacodyl (DULCOLAX) 5 MG EC tablet Take 2 tablets (10 mg total) by mouth at bedtime as needed for moderate constipation. 04/30/21  Yes Gillis Santa, MD  carbamazepine (TEGRETOL XR) 100 MG 12 hr tablet Take 50 mg by mouth 4 (four) times daily.   Yes [provider]  ferrous sulfate 325 (65 FE) MG tablet Take 325 mg by mouth daily with breakfast.   Yes [provider]  gabapentin (NEURONTIN) 100 MG capsule Take 100 mg by mouth 2 (two) times daily.   Yes [provider]  levothyroxine (SYNTHROID) 100 MCG tablet Take 100 mcg by mouth daily before breakfast.   Yes [provider]  midodrine (PROAMATINE) 10 MG tablet Place 1 tablet (10 mg total) into feeding tube 3 (three) times daily as needed (if SBP < 100). 04/30/21  Yes Gillis Santa, MD  oxycodone (OXY-IR) 5 MG capsule Take 5 mg by mouth 3 (three) times daily.   Yes [provider]  polyethylene glycol (MIRALAX / GLYCOLAX) 17 g packet Take 17 g by mouth daily. 05/01/21  Yes Gillis Santa, MD  doxycycline (MONODOX) 100 MG capsule Take 100 mg by mouth 2 (two) times daily. Patient not taking: No sig reported 04/30/21   [provider]     Allergies Kaopectate  [attapulgite], Thiopental, Gatifloxacin, Ceftin [cefuroxime], and Tomato   Family History  Family history unknown: Yes    Social History Social History   Tobacco Use  . Smoking status: Never  . Smokeless tobacco: Never  Substance Use  Topics  . Alcohol use: Not Currently  . Drug use: Not Currently    Review of Systems Level 5 Caveat: Portions of the History and Physical including HPI and review of systems are unable to be completely obtained due to patient being a poor historian   Constitutional:   No known fever.  ENT:   No rhinorrhea. Cardiovascular:   No chest pain or syncope. Respiratory:   No dyspnea or cough. Gastrointestinal:   Negative for abdominal pain, vomiting and diarrhea.  Musculoskeletal:   Negative for focal pain or swelling ____________________________________________   PHYSICAL EXAM:  VITAL SIGNS: ED Triage Vitals  Enc Vitals Group     BP 07/04/21 0923 99/78     Pulse Rate 07/04/21 0923 (!) 128     Resp 07/04/21 0923 (!) 22     Temp 07/04/21 0923 97.9 F (36.6 C)     Temp Source 07/04/21 0923 Oral     SpO2 07/04/21 0923 96 %     Weight 07/04/21 0930 146 lb 9.7 oz (66.5 kg)     Height --      Head Circumference --  Peak Flow --      Pain Score --      Pain Loc --      Pain Edu? --      Excl. in GC? --     Vital signs reviewed, nursing assessments reviewed.   Constitutional:   Awake, not alert, not oriented.  Ill-appearing.. Eyes:   Conjunctivae are normal. EOMI. PERRL. ENT      Head:   Normocephalic and atraumatic.      Nose:   No congestion/rhinnorhea.       Mouth/Throat:   Dry mucous membranes.       Neck:   No meningismus. Full ROM. Hematological/Lymphatic/Immunilogical:   No cervical lymphadenopathy. Cardiovascular:   Tachycardia heart rate 130. Symmetric bilateral radial and DP pulses.  No murmurs. Cap refill less than 2 seconds. Respiratory:   Tachypnea, respiratory rate of 24.  No accessory muscle use.  Diffuse right-sided crackles.  Left lung clear. Gastrointestinal:   Soft and nontender. Non distended. There is no CVA tenderness.  No rebound, rigidity, or guarding.  Musculoskeletal:   Chronic contractures.  Soft heel boots in place.  Stage IV sacral decubitus  wound with soft tissue undermining, no necrosis, purulent drainage, or surrounding cellulitis.  There is also a stage III pressure wound at the inferior tip of the left scapula, about 3 cm in size, appears uninfected. Neurologic:   Nonverbal, groans in response to pain.  Skin:    Skin is warm, dry and intact. No rash noted.  No petechiae, purpura, or bullae.  ____________________________________________    LABS (pertinent positives/negatives) (all labs ordered are listed, but only abnormal results are displayed) Labs Reviewed  LACTIC ACID, PLASMA - Abnormal; Notable for the following components:      Result Value   Lactic Acid, Venous 3.2 (*)    All other components within normal limits  COMPREHENSIVE METABOLIC PANEL - Abnormal; Notable for the following components:   Sodium 133 (*)    Potassium 6.6 (*)    Chloride 96 (*)    Glucose, Bld 178 (*)    BUN 65 (*)    Creatinine, Ser 2.68 (*)    Total Protein 8.7 (*)    Albumin 2.9 (*)    AST 61 (*)    Alkaline Phosphatase 147 (*)    GFR, Estimated 21 (*)    All other components within normal limits  CBC WITH DIFFERENTIAL/PLATELET - Abnormal; Notable for the following components:   WBC 24.2 (*)    Neutro Abs 21.4 (*)    Monocytes Absolute 1.7 (*)    Abs Immature Granulocytes 0.27 (*)    All other components within normal limits  RESP PANEL BY RT-PCR (FLU A&B, COVID) ARPGX2  CULTURE, BLOOD (ROUTINE X 2)  CULTURE, BLOOD (ROUTINE X 2)  URINE CULTURE  PROTIME-INR  APTT  LACTIC ACID, PLASMA  URINALYSIS, COMPLETE (UACMP) WITH MICROSCOPIC  PROCALCITONIN   ____________________________________________   EKG  Interpreted by me Sinus tachycardia rate 130.  Normal axis and intervals.  Normal QRS ST segments and T waves.  ____________________________________________    RADIOLOGY  DG Chest Port 1 View  Result Date: 07/04/2021 CLINICAL DATA:  Questionable sepsis. EXAM: PORTABLE CHEST 1 VIEW COMPARISON:  04/24/2021 FINDINGS:  Rotated film. Face obscures right lung apex. Diffuse airspace disease noted right lung. Left lung relatively clear. Cardiopericardial silhouette is at upper limits of normal for size. Bones are diffusely demineralized. IMPRESSION: Diffuse airspace disease right lung consistent with pneumonia. Electronically Signed   By: Minerva Areola  Molli PoseyMansell M.D.   On: 07/04/2021 10:30    ____________________________________________   PROCEDURES .Critical Care Performed by: Sharman CheekStafford, Camrin Gearheart, MD Authorized by: Sharman CheekStafford, Charde Macfarlane, MD   Critical care provider statement:    Critical care time (minutes):  35   Critical care time was exclusive of:  Separately billable procedures and treating other patients   Critical care was necessary to treat or prevent imminent or life-threatening deterioration of the following conditions:  Dehydration, sepsis, CNS failure or compromise, metabolic crisis and circulatory failure   Critical care was time spent personally by me on the following activities:  Development of treatment plan with patient or surrogate, discussions with consultants, evaluation of patient's response to treatment, examination of patient, obtaining history from patient or surrogate, ordering and performing treatments and interventions, ordering and review of laboratory studies, ordering and review of radiographic studies, pulse oximetry, re-evaluation of patient's condition and review of old charts  ____________________________________________  DIFFERENTIAL DIAGNOSIS   Sepsis, pneumonia, catheter associated UTI, dehydration, electrolyte abnormality  CLINICAL IMPRESSION / ASSESSMENT AND PLAN / ED COURSE  Medications ordered in the ED: Medications  vancomycin (VANCOCIN) IVPB 1000 mg/200 mL premix (has no administration in time range)  calcium gluconate inj 10% (1 g) URGENT USE ONLY! (has no administration in time range)  sodium bicarbonate injection 50 mEq (has no administration in time range)  dextrose 50 %  solution 50 mL (has no administration in time range)  insulin aspart (novoLOG) injection 10 Units (has no administration in time range)  sodium zirconium cyclosilicate (LOKELMA) packet 10 g (has no administration in time range)  sodium chloride 0.9 % bolus 1,000 mL (1,000 mLs Intravenous New Bag/Given 07/04/21 0954)    And  sodium chloride 0.9 % bolus 1,000 mL (1,000 mLs Intravenous New Bag/Given 07/04/21 1001)  aztreonam (AZACTAM) 2 g in sodium chloride 0.9 % 100 mL IVPB (2 g Intravenous New Bag/Given 07/04/21 0956)    Pertinent labs & imaging results that were available during my care of the patient were reviewed by me and considered in my medical decision making (see chart for details).   Gl[facility] NurseJamilia L Malkowski was evaluated in Emergency Department on 07/04/2021 for the symptoms described in the history of present illness. She was evaluated in the context of the global COVID-19 pandemic, which necessitated consideration that the patient might be at risk for infection with the SARS-CoV-2 virus that causes COVID-19. Institutional protocols and algorithms that pertain to the evaluation of patients at risk for COVID-19 are in a state of rapid change based on information released by regulatory bodies including the CDC and federal and state organizations. These policies and algorithms were followed during the patient's care in the ED.     Clinical Course as of 07/04/21 1335  Mon Jul 04, 2021  [id number] Patient presents with tachycardia, tachypnea, altered mental status compared to normal baseline.  Diffuse right-sided crackles in the lung.  Unable to obtain urine culture due to no urine in the bladder able to be aspirated from the Foley catheter. [PS]  1333 Patient's nurse change the Foley, noted the 10 mL balloon to be inflated with 35 mL of liquid.  After deflating balloon and removing the old Foley, there is a large amount of urine output. [PS]    Clinical Course User Index [PS] Sharman CheekStafford, Kyree Adriano, MD      ----------------------------------------- 11:08 AM on 07/04/2021 ----------------------------------------- Labs reveal AKI with a creatinine over 2, potassium of 6.6.  No acute EKG changes.  Lactate of 3.  No  signs of shock presently.  We will give insulin, glucose, bicarb, calcium, Lokelma for the hyperkalemia which I also expect will resolve with improved hydration and return of kidney function.  We will monitor urine output.  We will need to admit for further management.  ____________________________________________   FINAL CLINICAL IMPRESSION(S) / ED DIAGNOSES    Final diagnoses:  Severe sepsis (HCC)  HCAP (healthcare-associated pneumonia)  AKI (acute kidney injury) (HCC)  Sacral decubitus ulcer, stage IV (HCC)  Pressure injury of back, stage 3 (HCC)  Chronic indwelling Foley catheter     ED Discharge Orders     None       Portions of this note were generated with dragon dictation software. Dictation errors may occur despite best attempts at proofreading.   Sharman Cheek, MD 07/04/21 1109

## 2021-07-04 NOTE — Progress Notes (Signed)
CODE SEPSIS - PHARMACY COMMUNICATION  **Broad Spectrum Antibiotics should be administered within 1 hour of Sepsis diagnosis**  Time Code Sepsis Called/Page Received: 2683  Antibiotics Ordered: aztreonam 2 grams x 1  Time of 1st antibiotic administration: 0956  Additional action taken by pharmacy: None  If necessary, Name of Provider/Nurse Contacted: N/a    Jaynie Bream, PharmD Pharmacy Resident  07/04/2021 9:44 AM

## 2021-07-04 NOTE — Sepsis Progress Note (Signed)
Sepsis protocol is being followed by [facility]. 
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
## 2021-07-04 NOTE — Progress Notes (Signed)
1715 Patient received from ED per stretcher. Bedside report given. Patient barely responsive to pain. Temperature 95.4-warming blanket applied. Patient non verbal. Levophed at 10 mcg per left  hand IV- blood return noted. IV watch applied to site. No spontaneous movement noted. Wound on sacrum, back and right shin noted on admission. Monitor show NSR rate 80s. Noted to have black foul smelling drainage around g tube placement. Patient grounded when area cleaned.New split dressing applied. Patient made NPO and abd. xray ordered. Dr Belia Heman informed of drainage and nothing per g-tube.
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
## 2021-07-04 NOTE — ED Triage Notes (Signed)
Pt ems from [facility] for AMS. Per ems pt was at baseline yesterday. Pt with indwelling foley and G-tube.

## 2021-07-04 NOTE — Progress Notes (Signed)
Pharmacy Antibiotic Note  Joanne [name] is a 50 y.o. female admitted on 07/04/2021 with pneumonia.  Pharmacy has been consulted for vancomycin and cefepime dosing.  Patient with PMH including diabetes, hypothyroid, seizures, CHF, prior TBI, with G tube presented to the ED from SNF due to acutely changing altered mental status as compared to the prior day. Patient with history of Ceftin allergy reported in 2021.   In 2021, patient was admitted with aspiration pneumonia with tracheal aspirate growing Pseudomonas and Providencia stuartii. Patient recently admitted in July with septic shock/UTI/chronic sacral decubitis. Received cefepime x 1 day followed by Unasyn.   Currently, patient is septic with tachycardia, tachypnea and lactic acid of 3.2. Vancomycin 1,000 mg x 1 and aztreonam 2 grams x 1 given in the ED, given history of cephalosporin allergy. Will continue with cefepime due to recent history of receiving cephalosporins.  Renal function unstable with Scr 2.68, significantly above BL of ~0.47.   Plan: Give vancomycin 500 mg x 1 today to complete loading dose of 1500 mg. Due to unstable renal function, random vancomycin level at 24 hours from dose (9/13 at ~1300) Will continue to dose per random levels until renal function is back at baseline. Give cefepime 2 grams every 24 hours Continue metronidazole 500 mg every 12 hours Monitor WBC, vitals, renal function, and clinical course.   Weight: 66.5 kg (146 lb 9.7 oz)  Temp (24hrs), Avg:97.9 F (36.6 C), Min:97.9 F (36.6 C), Max:97.9 F (36.6 C)  Recent Labs  Lab 07/04/21 0921  WBC 24.2*  CREATININE 2.68*  LATICACIDVEN 3.2*    Estimated Creatinine Clearance: 23.3 mL/min (A) (by C-G formula based on SCr of 2.68 mg/dL (H)).    Allergies  Allergen Reactions   Kaopectate  [Attapulgite] Itching   Thiopental Itching   Gatifloxacin     Other reaction(s): Unknown   Ceftin [Cefuroxime]     Tolerates cefepime, ceftriaxone,  ampicillin/sulbactam, piperacillin/tazobactam   Tomato     Antimicrobials this admission: Vancomycin 9/12 >>  Aztreonam 9/12 >> 9/12 Cefepime 9/12 >> Metronidazole 9/12 >>  Dose adjustments this admission: None  Microbiology results: 9/12 BCx: ip 9/23 MRSA PCR: ip  Thank you for allowing pharmacy to be a part of this patient's care.  Jaynie Bream, PharmD Pharmacy Resident  07/04/2021 12:30 PM

## 2021-07-04 NOTE — ED Notes (Addendum)
Replaced pts existing foley. Existing foley was 18fr. 10cc balloon, not the 75fr that is in the chart. The 10cc balloon had 35cc of fluid in it. Replaced foley with a 84fr 10cc catheter but that cath had a significant leak around it. That cath was replaced with a 58fr 30cc balloon.

## 2021-07-04 NOTE — ED Notes (Signed)
Dr Scotty Court notified in person of pts LA 3.2 and K 6.6.

## 2021-07-04 NOTE — Sepsis Progress Note (Signed)
Secure chat with bedside nurse to obtain 3rd LA per sepsis protocol since the 2nd LA was higher than the 1st.

## 2021-07-04 NOTE — ED Notes (Signed)
Know chronic sacral wound, unable to stage, pink granulating tissue with undermining, no drainage noted.. Known, chronic wound between shoulder blades. Pink tissue, granulating, no drainage noted.

## 2021-07-04 NOTE — Progress Notes (Signed)
PHARMACY -  BRIEF ANTIBIOTIC NOTE   Pharmacy has received consult(s) for vancomycin and aztreonam from an ED provider.  The patient's profile has been reviewed for ht/wt/allergies/indication/available labs.    One time order(s) placed for vancomycin 1,000 mg x 1 and aztreonam 2 grams x 1  Of note, patient has tolerated cefepime (July 2022) in past. Consider antibiotic change moving forward.  Further antibiotics/pharmacy consults should be ordered by admitting physician if indicated.                       Thank you, Jaynie Bream, PharmD Pharmacy Resident  07/04/2021 10:27 AM

## 2021-07-04 NOTE — Consult Note (Signed)
WOC Nurse Consult Note: Patient receiving care in [facility] ED14. Reason for Consult: wounds to sacrum, back, LLE.  Photos of wounds provided in MD HPI are greatly appreciated. Wound type: Stage 4 to sacrum, stage 3 to left inferior scapula tip, full thickness to LLE; all chronic in nature. Pressure Injury POA: Yes Measurement: To be provided by the bedside RN in the flowsheet section  Wound bed: see photos Drainage (amount, consistency, odor)  Periwound: see photos Dressing procedure/placement/frequency: Cleanse wounds with soap and water, pat dry. Place a size appropriate pieces of Xeroform over the wound bed, then a size appropriate foam dressing; change daily.  I have also ordered a standard size bed with air mattress for pressure relief.  WOC nurse will not follow at this time.  Please re-consult the WOC team if needed.  Helmut Muster, RN, MSN, CWOCN, CNS-BC, pager [phone]

## 2021-07-05 ENCOUNTER — Inpatient Hospital Stay: Payer: Medicare Other

## 2021-07-05 ENCOUNTER — Other Ambulatory Visit: Payer: Self-pay

## 2021-07-05 DIAGNOSIS — I5032 Chronic diastolic (congestive) heart failure: Secondary | ICD-10-CM

## 2021-07-05 DIAGNOSIS — Z931 Gastrostomy status: Secondary | ICD-10-CM

## 2021-07-05 DIAGNOSIS — G9341 Metabolic encephalopathy: Secondary | ICD-10-CM

## 2021-07-05 LAB — BLOOD CULTURE ID PANEL (REFLEXED) - BCID2
A.calcoaceticus-baumannii: NOT DETECTED
A.calcoaceticus-baumannii: NOT DETECTED
Bacteroides fragilis: NOT DETECTED
Bacteroides fragilis: NOT DETECTED
CTX-M ESBL: NOT DETECTED
Candida albicans: NOT DETECTED
Candida albicans: NOT DETECTED
Candida auris: NOT DETECTED
Candida auris: NOT DETECTED
Candida glabrata: NOT DETECTED
Candida glabrata: NOT DETECTED
Candida krusei: NOT DETECTED
Candida krusei: NOT DETECTED
Candida parapsilosis: NOT DETECTED
Candida parapsilosis: NOT DETECTED
Candida tropicalis: NOT DETECTED
Candida tropicalis: NOT DETECTED
Carbapenem resist OXA 48 LIKE: NOT DETECTED
Carbapenem resistance IMP: NOT DETECTED
Carbapenem resistance KPC: NOT DETECTED
Carbapenem resistance NDM: NOT DETECTED
Carbapenem resistance VIM: NOT DETECTED
Cryptococcus neoformans/gattii: NOT DETECTED
Cryptococcus neoformans/gattii: NOT DETECTED
Enterobacter cloacae complex: NOT DETECTED
Enterobacter cloacae complex: NOT DETECTED
Enterobacterales: NOT DETECTED
Enterobacterales: DETECTED — AB
Enterococcus Faecium: NOT DETECTED
Enterococcus Faecium: NOT DETECTED
Enterococcus faecalis: NOT DETECTED
Enterococcus faecalis: NOT DETECTED
Escherichia coli: NOT DETECTED
Escherichia coli: NOT DETECTED
Haemophilus influenzae: NOT DETECTED
Haemophilus influenzae: NOT DETECTED
Klebsiella aerogenes: NOT DETECTED
Klebsiella aerogenes: NOT DETECTED
Klebsiella oxytoca: NOT DETECTED
Klebsiella oxytoca: NOT DETECTED
Klebsiella pneumoniae: NOT DETECTED
Klebsiella pneumoniae: NOT DETECTED
Listeria monocytogenes: NOT DETECTED
Listeria monocytogenes: NOT DETECTED
Methicillin resistance mecA/C: DETECTED — AB
Neisseria meningitidis: NOT DETECTED
Neisseria meningitidis: NOT DETECTED
Proteus species: NOT DETECTED
Proteus species: DETECTED — AB
Pseudomonas aeruginosa: NOT DETECTED
Pseudomonas aeruginosa: NOT DETECTED
Salmonella species: NOT DETECTED
Salmonella species: NOT DETECTED
Serratia marcescens: NOT DETECTED
Serratia marcescens: NOT DETECTED
Staphylococcus aureus (BCID): NOT DETECTED
Staphylococcus aureus (BCID): NOT DETECTED
Staphylococcus epidermidis: DETECTED — AB
Staphylococcus epidermidis: NOT DETECTED
Staphylococcus lugdunensis: NOT DETECTED
Staphylococcus lugdunensis: NOT DETECTED
Staphylococcus species: DETECTED — AB
Staphylococcus species: NOT DETECTED
Stenotrophomonas maltophilia: NOT DETECTED
Stenotrophomonas maltophilia: NOT DETECTED
Streptococcus agalactiae: NOT DETECTED
Streptococcus agalactiae: NOT DETECTED
Streptococcus pneumoniae: NOT DETECTED
Streptococcus pneumoniae: NOT DETECTED
Streptococcus pyogenes: NOT DETECTED
Streptococcus pyogenes: NOT DETECTED
Streptococcus species: NOT DETECTED
Streptococcus species: NOT DETECTED

## 2021-07-05 LAB — CBC
HCT: 29.7 % — ABNORMAL LOW (ref 36.0–46.0)
Hemoglobin: 9.8 g/dL — ABNORMAL LOW (ref 12.0–15.0)
MCH: 28.6 pg (ref 26.0–34.0)
MCHC: 33 g/dL (ref 30.0–36.0)
MCV: 86.6 fL (ref 80.0–100.0)
Platelets: 185 10*3/uL (ref 150–400)
RBC: 3.43 MIL/uL — ABNORMAL LOW (ref 3.87–5.11)
RDW: 14.9 % (ref 11.5–15.5)
WBC: 21.7 10*3/uL — ABNORMAL HIGH (ref 4.0–10.5)
nRBC: 0 % (ref 0.0–0.2)

## 2021-07-05 LAB — GLUCOSE, CAPILLARY
Glucose-Capillary: 103 mg/dL — ABNORMAL HIGH (ref 70–99)
Glucose-Capillary: 104 mg/dL — ABNORMAL HIGH (ref 70–99)
Glucose-Capillary: 116 mg/dL — ABNORMAL HIGH (ref 70–99)
Glucose-Capillary: 122 mg/dL — ABNORMAL HIGH (ref 70–99)
Glucose-Capillary: 131 mg/dL — ABNORMAL HIGH (ref 70–99)
Glucose-Capillary: 137 mg/dL — ABNORMAL HIGH (ref 70–99)
Glucose-Capillary: 86 mg/dL (ref 70–99)

## 2021-07-05 LAB — COMPREHENSIVE METABOLIC PANEL
ALT: 23 U/L (ref 0–44)
AST: 33 U/L (ref 15–41)
Albumin: 2.1 g/dL — ABNORMAL LOW (ref 3.5–5.0)
Alkaline Phosphatase: 113 U/L (ref 38–126)
Anion gap: 8 (ref 5–15)
BUN: 47 mg/dL — ABNORMAL HIGH (ref 6–20)
CO2: 27 mmol/L (ref 22–32)
Calcium: 8.6 mg/dL — ABNORMAL LOW (ref 8.9–10.3)
Chloride: 104 mmol/L (ref 98–111)
Creatinine, Ser: 1.16 mg/dL — ABNORMAL HIGH (ref 0.44–1.00)
GFR, Estimated: 57 mL/min — ABNORMAL LOW (ref >60)
Glucose, Bld: 132 mg/dL — ABNORMAL HIGH (ref 70–99)
Potassium: 5.3 mmol/L — ABNORMAL HIGH (ref 3.5–5.1)
Sodium: 139 mmol/L (ref 135–145)
Total Bilirubin: 1 mg/dL (ref 0.3–1.2)
Total Protein: 7 g/dL (ref 6.5–8.1)

## 2021-07-05 LAB — URINALYSIS, COMPLETE (UACMP) WITH MICROSCOPIC
Bilirubin Urine: NEGATIVE
Glucose, UA: NEGATIVE mg/dL
Ketones, ur: NEGATIVE mg/dL
Nitrite: NEGATIVE
Protein, ur: 30 mg/dL — AB
Specific Gravity, Urine: 1.011 (ref 1.005–1.030)
WBC, UA: >50 WBC/hpf — ABNORMAL HIGH (ref 0–5)
pH: 5 (ref 5.0–8.0)

## 2021-07-05 LAB — STREP PNEUMONIAE URINARY ANTIGEN: Strep Pneumo Urinary Antigen: NEGATIVE

## 2021-07-05 LAB — PROCALCITONIN: Procalcitonin: 17.73 ng/mL

## 2021-07-05 MED ORDER — DOCUSATE SODIUM 50 MG/5ML PO LIQD
100.0000 mg | Freq: Two times a day (BID) | ORAL | Status: DC
  Administered 2021-07-05 – 2021-07-12 (×10): 100 mg
  Filled 2021-07-05 (×15): qty 10

## 2021-07-05 MED ORDER — SODIUM CHLORIDE 0.9 % IV SOLN
2.0000 g | INTRAVENOUS | Status: DC
  Administered 2021-07-06 – 2021-07-07 (×2): 2 g via INTRAVENOUS
  Filled 2021-07-05: qty 20
  Filled 2021-07-05: qty 2
  Filled 2021-07-05 (×2): qty 20

## 2021-07-05 MED ORDER — FREE WATER
75.0000 mL | Status: DC
  Administered 2021-07-05 – 2021-07-12 (×33): 75 mL

## 2021-07-05 MED ORDER — POLYETHYLENE GLYCOL 3350 17 G PO PACK
17.0000 g | PACK | Freq: Every day | ORAL | Status: DC
  Administered 2021-07-05 – 2021-07-06 (×2): 17 g via ORAL
  Filled 2021-07-05 (×2): qty 1

## 2021-07-05 MED ORDER — SODIUM CHLORIDE 0.9 % IV SOLN
2.0000 g | Freq: Two times a day (BID) | INTRAVENOUS | Status: AC
  Administered 2021-07-05 (×2): 2 g via INTRAVENOUS
  Filled 2021-07-05 (×2): qty 2

## 2021-07-05 MED ORDER — PROSOURCE TF PO LIQD
45.0000 mL | Freq: Two times a day (BID) | ORAL | Status: DC
  Administered 2021-07-05 – 2021-07-10 (×9): 45 mL
  Filled 2021-07-05 (×11): qty 45

## 2021-07-05 MED ORDER — ASCORBIC ACID 500 MG PO TABS
500.0000 mg | ORAL_TABLET | Freq: Every day | ORAL | Status: DC
  Administered 2021-07-06 – 2021-07-10 (×5): 500 mg
  Filled 2021-07-05 (×5): qty 1

## 2021-07-05 MED ORDER — CARBAMAZEPINE 100 MG PO CHEW
100.0000 mg | CHEWABLE_TABLET | Freq: Two times a day (BID) | ORAL | Status: DC
  Administered 2021-07-05 – 2021-07-12 (×12): 100 mg
  Filled 2021-07-05 (×16): qty 1

## 2021-07-05 MED ORDER — SENNOSIDES 8.8 MG/5ML PO SYRP
5.0000 mL | ORAL_SOLUTION | Freq: Every day | ORAL | Status: DC
  Administered 2021-07-05: 5 mL
  Filled 2021-07-05 (×2): qty 5

## 2021-07-05 MED ORDER — IOHEXOL 350 MG/ML SOLN
75.0000 mL | Freq: Once | INTRAVENOUS | Status: AC | PRN
  Administered 2021-07-05: 75 mL via INTRAVENOUS

## 2021-07-05 MED ORDER — ALBUMIN HUMAN 25 % IV SOLN
25.0000 g | Freq: Once | INTRAVENOUS | Status: AC
  Administered 2021-07-05: 25 g via INTRAVENOUS
  Filled 2021-07-05: qty 100

## 2021-07-05 MED ORDER — LEVOTHYROXINE SODIUM 100 MCG PO TABS
100.0000 ug | ORAL_TABLET | Freq: Every day | ORAL | Status: DC
  Administered 2021-07-06: 100 ug via ORAL
  Filled 2021-07-05: qty 1

## 2021-07-05 MED ORDER — GABAPENTIN 100 MG PO CAPS
100.0000 mg | ORAL_CAPSULE | Freq: Two times a day (BID) | ORAL | Status: DC
  Administered 2021-07-05 – 2021-07-06 (×2): 100 mg via ORAL
  Filled 2021-07-05 (×2): qty 1

## 2021-07-05 MED ORDER — BACLOFEN 10 MG PO TABS: 10.0000 mg | ORAL_TABLET | Freq: Three times a day (TID) | ORAL | Status: DC

## 2021-07-05 MED ORDER — OXYCODONE-ACETAMINOPHEN 5-325 MG PO TABS
1.0000 | ORAL_TABLET | Freq: Three times a day (TID) | ORAL | Status: DC
Start: 2021-07-05 — End: 2021-07-12
  Administered 2021-07-05 – 2021-07-12 (×16): 1
  Filled 2021-07-05 (×17): qty 1

## 2021-07-05 MED ORDER — LACTATED RINGERS IV BOLUS
1000.0000 mL | Freq: Once | INTRAVENOUS | Status: AC
  Administered 2021-07-05: 1000 mL via INTRAVENOUS

## 2021-07-05 MED ORDER — SODIUM CHLORIDE 0.9% FLUSH
10.0000 mL | Freq: Two times a day (BID) | INTRAVENOUS | Status: DC
  Administered 2021-07-05 – 2021-07-08 (×6): 10 mL
  Administered 2021-07-08 – 2021-07-09 (×2): 20 mL
  Administered 2021-07-09: 10 mL
  Administered 2021-07-10 – 2021-07-11 (×2): 20 mL
  Administered 2021-07-11 – 2021-07-12 (×2): 10 mL

## 2021-07-05 MED ORDER — MORPHINE SULFATE (PF) 2 MG/ML IV SOLN
2.0000 mg | INTRAVENOUS | Status: DC | PRN
  Administered 2021-07-05 – 2021-07-12 (×9): 2 mg via INTRAVENOUS
  Filled 2021-07-05 (×10): qty 1

## 2021-07-05 MED ORDER — SODIUM CHLORIDE 0.9% FLUSH: 10.0000 mL | INTRAVENOUS | Status: DC | PRN

## 2021-07-05 MED ORDER — DIATRIZOATE MEGLUMINE & SODIUM 66-10 % PO SOLN
30.0000 mL | Freq: Once | ORAL | Status: AC
  Administered 2021-07-05: 30 mL

## 2021-07-05 MED ORDER — LACTATED RINGERS IV SOLN
INTRAVENOUS | Status: DC
  Administered 2021-07-05: 100 mL via INTRAVENOUS

## 2021-07-05 MED ORDER — CARBAMAZEPINE ER 100 MG PO TB12: 100.0000 mg | ORAL_TABLET | Freq: Two times a day (BID) | ORAL | Status: DC

## 2021-07-05 MED ORDER — OSMOLITE 1.5 CAL PO LIQD
1000.0000 mL | ORAL | Status: DC
  Administered 2021-07-05 – 2021-07-07 (×3): 1000 mL

## 2021-07-05 MED ORDER — MIDODRINE HCL 5 MG PO TABS
10.0000 mg | ORAL_TABLET | Freq: Three times a day (TID) | ORAL | Status: DC
  Administered 2021-07-06: 10 mg via ORAL
  Filled 2021-07-05: qty 2
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
## 2021-07-05 NOTE — Progress Notes (Signed)
Initial Nutrition Assessment  DOCUMENTATION CODES:  Not applicable  INTERVENTION:  Recommend the following TF regimen: Osmolite 1.5 at 31mL/h (1.2L/d) with 2 packets of prosource daily (40kcal and 11g of protein per packet) 43mL free water q4h This will provide 1880kcal, 97g of protein, of free water (TF+flush)  NUTRITION DIAGNOSIS:  Increased nutrient needs related to wound healing as evidenced by estimated needs.  GOAL:  Patient will meet greater than or equal to 90% of their needs  MONITOR:  TF tolerance  REASON FOR ASSESSMENT:  Consult Enteral/tube feeding initiation and management  ASSESSMENT:  50 y.o. female with a history of DM type 2, hypothyroidism, seizure disorder, CHF, prior TBI (s/p chronic decannulated trach and PEG) presented to the ED from her SNF for AMS and low urine output. Found to be septic on admission  Pt admitted to ICU for management of septic shock. Found to have an AKI and was initially requiring pressor support. Black foul smelling drainage noted by nursing staff from PEG. Imaging obtained and per MD, ok to use tube.   Pt was admitted in July and followed by nutrition services at that time. At facility, pt does consume some POs at meals but this is mostly for pleasure, does not provide a significant source of nutrition. On Jevity 1.5 at [employer].  Pt resting in bed at the time of assessment. Reports she is having a lot of pain where her tube is. Able to be calmed. RN aware of pt's pain and working on getting pt medication for pt that can be administered.   Weight appears stable since last admission and similar physical exam. Low muscle tone, but this is likely due to pt's bedbound status and baseline contraction. Will place recommendations to TF to be utilized. Pt on Jevity 1.5 at baseline, will initiate Osmolite 1.5 until blood pressure is more stable to avoid infusion of fiber   Intake/Output Summary (Last 24 hours) at 07/05/2021  1814 Last data filed at 07/05/2021 1534 Gross per 24 hour  Intake 3073.45 ml  Output 2095 ml  Net 978.45 ml  Net IO Since Admission: 228.45 mL [07/05/21 1814]  Nutritionally Relevant Medications: Scheduled Meds:  baclofen  2.5 mg Per Tube TID   Continuous Infusions:  ceFEPime (MAXIPIME) IV 2 g (07/05/21 1058)   lactated ringers 100 mL (07/05/21 1709)   PRN Meds: bisacodyl, ondansetron  Labs Reviewed: K 5.3 (improving) BUN 47, creatinine 1.16 (improving) SBG ranges from 86-137 mg/dL over the last 24 hours  NUTRITION - FOCUSED PHYSICAL EXAM: Flowsheet Row Most Recent Value  Orbital Region Mild depletion  Upper Arm Region No depletion  Thoracic and Lumbar Region No depletion  Buccal Region No depletion  Temple Region Mild depletion  Clavicle Bone Region Mild depletion  Clavicle and Acromion Bone Region No depletion  Scapular Bone Region No depletion  Dorsal Hand No depletion  Patellar Region No depletion  Anterior Thigh Region No depletion  Posterior Calf Region No depletion  Edema (RD Assessment) Mild  Hair Reviewed  Eyes Reviewed  Mouth Reviewed  Skin Reviewed  Nails Reviewed   Diet Order:   Diet Order             Diet NPO time specified  Diet effective now                   EDUCATION NEEDS:  No education needs have been identified at this time  Skin:  Skin Assessment: Reviewed RN Assessment  Last BM:  prior  to admission  Height:  Ht Readings from Last 1 Encounters:  07/04/21 5\' 5"  (1.651 m)   Weight:  Wt Readings from Last 1 Encounters:  07/05/21 68.3 kg    Ideal Body Weight:  56.8 kg  BMI:  Body mass index is 25.06 kg/m.  Estimated Nutritional Needs:  Kcal:  1800-2000 kcal/d Protein:  90-100 g/d Fluid:  2L/d   07/07/21, RD, LDN Clinical Dietitian Pager on Amion
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
## 2021-07-05 NOTE — Progress Notes (Signed)
Hold all tube meds per [place], NP until PEG can be verified by IR. Unable to verify correct placement of PEG tube on KUB. Peg tube found with black drainage smelling like stool.    Werner Lean, RN

## 2021-07-05 NOTE — Progress Notes (Signed)
More alert today. Remained on Levophed until 1400. Down for an CT of abdomen with contrast. CT showed a functional obstruction of distal transverse colon and descending colon. Dr. Belia Heman states we can use G tube.

## 2021-07-05 NOTE — Progress Notes (Signed)
PHARMACY - PHYSICIAN COMMUNICATION CRITICAL VALUE ALERT - BLOOD CULTURE IDENTIFICATION (BCID)  Joanne [name] is an 50 y.o. female who presented to [facility] on 07/04/2021 with a chief complaint of acute metabolic encephalopathy and septic shock  Assessment:  2/4 (same set) MRSE, suspect possible source of chronic decubitus ulcer vs contaminant   Name of physician (or Provider) Contacted: Webb Silversmith, NP  Current antibiotics: Vancomycin, metronidazole, cefepime   Changes to prescribed antibiotics recommended:  Patient is on recommended antibiotics - No changes needed  Results for orders placed or performed during the hospital encounter of 07/04/21  Blood Culture ID Panel (Reflexed) (Collected: 07/04/2021  9:21 AM)  Result Value Ref Range   Enterococcus faecalis NOT DETECTED NOT DETECTED   Enterococcus Faecium NOT DETECTED NOT DETECTED   Listeria monocytogenes NOT DETECTED NOT DETECTED   Staphylococcus species DETECTED (A) NOT DETECTED   Staphylococcus aureus (BCID) NOT DETECTED NOT DETECTED   Staphylococcus epidermidis DETECTED (A) NOT DETECTED   Staphylococcus lugdunensis NOT DETECTED NOT DETECTED   Streptococcus species NOT DETECTED NOT DETECTED   Streptococcus agalactiae NOT DETECTED NOT DETECTED   Streptococcus pneumoniae NOT DETECTED NOT DETECTED   Streptococcus pyogenes NOT DETECTED NOT DETECTED   A.calcoaceticus-baumannii NOT DETECTED NOT DETECTED   Bacteroides fragilis NOT DETECTED NOT DETECTED   Enterobacterales NOT DETECTED NOT DETECTED   Enterobacter cloacae complex NOT DETECTED NOT DETECTED   Escherichia coli NOT DETECTED NOT DETECTED   Klebsiella aerogenes NOT DETECTED NOT DETECTED   Klebsiella oxytoca NOT DETECTED NOT DETECTED   Klebsiella pneumoniae NOT DETECTED NOT DETECTED   Proteus species NOT DETECTED NOT DETECTED   Salmonella species NOT DETECTED NOT DETECTED   Serratia marcescens NOT DETECTED NOT DETECTED   Haemophilus influenzae NOT DETECTED NOT  DETECTED   Neisseria meningitidis NOT DETECTED NOT DETECTED   Pseudomonas aeruginosa NOT DETECTED NOT DETECTED   Stenotrophomonas maltophilia NOT DETECTED NOT DETECTED   Candida albicans NOT DETECTED NOT DETECTED   Candida auris NOT DETECTED NOT DETECTED   Candida glabrata NOT DETECTED NOT DETECTED   Candida krusei NOT DETECTED NOT DETECTED   Candida parapsilosis NOT DETECTED NOT DETECTED   Candida tropicalis NOT DETECTED NOT DETECTED   Cryptococcus neoformans/gattii NOT DETECTED NOT DETECTED   Methicillin resistance mecA/C DETECTED (A) NOT DETECTED    Sharen Hones, PharmD, BCPS Clinical Pharmacist   07/05/2021  4:16 AM

## 2021-07-06 LAB — CBC
HCT: 25 % — ABNORMAL LOW (ref 36.0–46.0)
Hemoglobin: 8 g/dL — ABNORMAL LOW (ref 12.0–15.0)
MCH: 27.9 pg (ref 26.0–34.0)
MCHC: 32 g/dL (ref 30.0–36.0)
MCV: 87.1 fL (ref 80.0–100.0)
Platelets: 193 10*3/uL (ref 150–400)
RBC: 2.87 MIL/uL — ABNORMAL LOW (ref 3.87–5.11)
RDW: 14.9 % (ref 11.5–15.5)
WBC: 13.1 10*3/uL — ABNORMAL HIGH (ref 4.0–10.5)
nRBC: 0 % (ref 0.0–0.2)

## 2021-07-06 LAB — COMPREHENSIVE METABOLIC PANEL
ALT: 14 U/L (ref 0–44)
AST: 14 U/L — ABNORMAL LOW (ref 15–41)
Albumin: 2.5 g/dL — ABNORMAL LOW (ref 3.5–5.0)
Alkaline Phosphatase: 82 U/L (ref 38–126)
Anion gap: 7 (ref 5–15)
BUN: 39 mg/dL — ABNORMAL HIGH (ref 6–20)
CO2: 25 mmol/L (ref 22–32)
Calcium: 8.4 mg/dL — ABNORMAL LOW (ref 8.9–10.3)
Chloride: 111 mmol/L (ref 98–111)
Creatinine, Ser: 0.64 mg/dL (ref 0.44–1.00)
GFR, Estimated: >60 mL/min (ref >60)
Glucose, Bld: 147 mg/dL — ABNORMAL HIGH (ref 70–99)
Potassium: 3.5 mmol/L (ref 3.5–5.1)
Sodium: 143 mmol/L (ref 135–145)
Total Bilirubin: 0.5 mg/dL (ref 0.3–1.2)
Total Protein: 6.4 g/dL — ABNORMAL LOW (ref 6.5–8.1)

## 2021-07-06 LAB — GLUCOSE, CAPILLARY
Glucose-Capillary: 151 mg/dL — ABNORMAL HIGH (ref 70–99)
Glucose-Capillary: 154 mg/dL — ABNORMAL HIGH (ref 70–99)
Glucose-Capillary: 140 mg/dL — ABNORMAL HIGH (ref 70–99)
Glucose-Capillary: 146 mg/dL — ABNORMAL HIGH (ref 70–99)
Glucose-Capillary: 193 mg/dL — ABNORMAL HIGH (ref 70–99)

## 2021-07-06 LAB — MAGNESIUM: Magnesium: 1.8 mg/dL (ref 1.7–2.4)

## 2021-07-06 LAB — URINE CULTURE: Culture: NO GROWTH

## 2021-07-06 LAB — LEGIONELLA PNEUMOPHILA SEROGP 1 UR AG: L. pneumophila Serogp 1 Ur Ag: NEGATIVE

## 2021-07-06 LAB — PROCALCITONIN: Procalcitonin: 7.62 ng/mL

## 2021-07-06 LAB — PHOSPHORUS: Phosphorus: 2.7 mg/dL (ref 2.5–4.6)

## 2021-07-06 MED ORDER — LEVOTHYROXINE SODIUM 50 MCG PO TABS
100.0000 ug | ORAL_TABLET | Freq: Every day | ORAL | Status: DC
  Administered 2021-07-07 – 2021-07-12 (×5): 100 ug
  Filled 2021-07-06 (×2): qty 2
  Filled 2021-07-06: qty 1
  Filled 2021-07-06 (×2): qty 2

## 2021-07-06 MED ORDER — SENNOSIDES 8.8 MG/5ML PO SYRP
10.0000 mL | ORAL_SOLUTION | Freq: Every day | ORAL | Status: DC
  Administered 2021-07-06: 10 mL
  Filled 2021-07-06 (×2): qty 10

## 2021-07-06 MED ORDER — POLYETHYLENE GLYCOL 3350 17 G PO PACK: 17.0000 g | PACK | Freq: Every day | ORAL | Status: DC

## 2021-07-06 MED ORDER — GABAPENTIN 250 MG/5ML PO SOLN
100.0000 mg | Freq: Two times a day (BID) | ORAL | Status: DC
  Administered 2021-07-06 – 2021-07-12 (×10): 100 mg
  Filled 2021-07-06 (×14): qty 2

## 2021-07-06 MED ORDER — MIDODRINE HCL 5 MG PO TABS
10.0000 mg | ORAL_TABLET | Freq: Three times a day (TID) | ORAL | Status: DC
  Administered 2021-07-06 – 2021-07-12 (×14): 10 mg
  Filled 2021-07-06 (×13): qty 2

## 2021-07-06 MED ORDER — POLYETHYLENE GLYCOL 3350 17 G PO PACK
17.0000 g | PACK | Freq: Two times a day (BID) | ORAL | Status: DC
  Administered 2021-07-06: 17 g
  Filled 2021-07-06: qty 1

## 2021-07-06 NOTE — Progress Notes (Addendum)
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 Cardiopericardial silhouette is at upper limits of normal for size. Bones are diffusely demineralized. IMPRESSION: Diffuse airspace disease right lung consistent with pneumonia. Electronically Signed   By: Kennith Center M.D.   On: 07/04/2021 10:30    Scheduled Meds:  vitamin C  500  mg Per Tube Daily   baclofen  2.5 mg Per Tube TID   carbamazepine  100 mg Per Tube BID   chlorhexidine  15 mL Mouth Rinse BID   Chlorhexidine Gluconate Cloth  6 each Topical Q0600   docusate  100 mg Per Tube BID   feeding supplement  (PROSource TF)  45 mL Per Tube BID   free water  75 mL Per Tube Q4H   gabapentin  100 mg Oral BID   heparin  5,000 Units Subcutaneous Q8H   hydrocortisone sod succinate (SOLU-CORTEF) inj  100 mg Intravenous Q8H   levothyroxine  100 mcg Oral Q0600   mouth rinse  15 mL Mouth Rinse q12n4p   midodrine  10 mg Oral TID WC   oxyCODONE-acetaminophen  1 tablet Per Tube TID   polyethylene glycol  17 g Oral Daily   sennosides  5 mL Per Tube QHS   sodium chloride flush  10-40 mL Intracatheter Q12H   Continuous Infusions:  cefTRIAXone (ROCEPHIN)  IV     feeding supplement (OSMOLITE 1.5 CAL) 1,000 mL (07/05/21 2031)   lactated ringers Stopped (07/05/21 2203)     LOS: 2 days    Osvaldo Shipper, MD [facility]  If 7PM-7AM, please contact night-coverage [email or web address]  07/06/2021, 9:05 AM
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
## 2021-07-07 DIAGNOSIS — J690 Pneumonitis due to inhalation of food and vomit: Secondary | ICD-10-CM | POA: Diagnosis not present

## 2021-07-07 DIAGNOSIS — R7881 Bacteremia: Secondary | ICD-10-CM | POA: Diagnosis not present

## 2021-07-07 LAB — CBC
HCT: 27.4 % — ABNORMAL LOW (ref 36.0–46.0)
Hemoglobin: 9 g/dL — ABNORMAL LOW (ref 12.0–15.0)
MCH: 28.6 pg (ref 26.0–34.0)
MCHC: 32.8 g/dL (ref 30.0–36.0)
MCV: 87 fL (ref 80.0–100.0)
Platelets: 213 10*3/uL (ref 150–400)
RBC: 3.15 MIL/uL — ABNORMAL LOW (ref 3.87–5.11)
RDW: 14.8 % (ref 11.5–15.5)
WBC: 9.1 10*3/uL (ref 4.0–10.5)
nRBC: 0 % (ref 0.0–0.2)

## 2021-07-07 LAB — COMPREHENSIVE METABOLIC PANEL
ALT: 18 U/L (ref 0–44)
AST: 21 U/L (ref 15–41)
Albumin: 2.5 g/dL — ABNORMAL LOW (ref 3.5–5.0)
Alkaline Phosphatase: 92 U/L (ref 38–126)
Anion gap: 6 (ref 5–15)
BUN: 35 mg/dL — ABNORMAL HIGH (ref 6–20)
CO2: 27 mmol/L (ref 22–32)
Calcium: 8.4 mg/dL — ABNORMAL LOW (ref 8.9–10.3)
Chloride: 111 mmol/L (ref 98–111)
Creatinine, Ser: 0.42 mg/dL — ABNORMAL LOW (ref 0.44–1.00)
GFR, Estimated: >60 mL/min (ref >60)
Glucose, Bld: 195 mg/dL — ABNORMAL HIGH (ref 70–99)
Potassium: 3.9 mmol/L (ref 3.5–5.1)
Sodium: 144 mmol/L (ref 135–145)
Total Bilirubin: 0.3 mg/dL (ref 0.3–1.2)
Total Protein: 7.1 g/dL (ref 6.5–8.1)

## 2021-07-07 LAB — GLUCOSE, CAPILLARY
Glucose-Capillary: 185 mg/dL — ABNORMAL HIGH (ref 70–99)
Glucose-Capillary: 158 mg/dL — ABNORMAL HIGH (ref 70–99)
Glucose-Capillary: 125 mg/dL — ABNORMAL HIGH (ref 70–99)
Glucose-Capillary: 117 mg/dL — ABNORMAL HIGH (ref 70–99)

## 2021-07-07 LAB — CULTURE, BLOOD (ROUTINE X 2)
Special Requests: ADEQUATE
Special Requests: ADEQUATE

## 2021-07-07 LAB — PHOSPHORUS: Phosphorus: 2.8 mg/dL (ref 2.5–4.6)

## 2021-07-07 LAB — HEMOGLOBIN A1C
Hgb A1c MFr Bld: 5.7 % — ABNORMAL HIGH (ref 4.8–5.6)
Mean Plasma Glucose: 116.89 mg/dL

## 2021-07-07 LAB — MAGNESIUM: Magnesium: 1.7 mg/dL (ref 1.7–2.4)

## 2021-07-07 MED ORDER — POLYETHYLENE GLYCOL 3350 17 G PO PACK
17.0000 g | PACK | Freq: Three times a day (TID) | ORAL | Status: DC
  Administered 2021-07-07 – 2021-07-08 (×6): 17 g
  Filled 2021-07-07 (×5): qty 1

## 2021-07-07 MED ORDER — INSULIN ASPART 100 UNIT/ML IJ SOLN: 0.0000 [IU] | INTRAMUSCULAR | Status: DC

## 2021-07-07 MED ORDER — INSULIN ASPART 100 UNIT/ML IJ SOLN
0.0000 [IU] | INTRAMUSCULAR | Status: DC
  Administered 2021-07-07: 2 [IU] via SUBCUTANEOUS
  Administered 2021-07-07: 1 [IU] via SUBCUTANEOUS
  Administered 2021-07-07: 2 [IU] via SUBCUTANEOUS
  Administered 2021-07-08: 1 [IU] via SUBCUTANEOUS
  Administered 2021-07-08: 2 [IU] via SUBCUTANEOUS
  Administered 2021-07-08: 1 [IU] via SUBCUTANEOUS
  Administered 2021-07-08 (×2): 2 [IU] via SUBCUTANEOUS
  Administered 2021-07-08: 1 [IU] via SUBCUTANEOUS
  Administered 2021-07-09 (×2): 2 [IU] via SUBCUTANEOUS
  Administered 2021-07-09 – 2021-07-12 (×6): 1 [IU] via SUBCUTANEOUS
  Filled 2021-07-07 (×13): qty 1

## 2021-07-07 MED ORDER — HYDROCORTISONE SOD SUC (PF) 100 MG IJ SOLR
50.0000 mg | Freq: Three times a day (TID) | INTRAMUSCULAR | Status: DC
  Administered 2021-07-07 – 2021-07-09 (×6): 50 mg via INTRAVENOUS
  Filled 2021-07-07 (×4): qty 1
  Filled 2021-07-07: qty 2
  Filled 2021-07-07 (×2): qty 1

## 2021-07-07 MED ORDER — SENNOSIDES 8.8 MG/5ML PO SYRP
10.0000 mL | ORAL_SOLUTION | Freq: Two times a day (BID) | ORAL | Status: DC
  Administered 2021-07-08 – 2021-07-12 (×5): 10 mL
  Filled 2021-07-07 (×13): qty 10

## 2021-07-07 NOTE — Progress Notes (Signed)
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 Assistant, and communication documented in the PACS or [employer]. Electronically Signed   By: Kennith Center M.D.   On: 07/05/2021 11:11    Scheduled Meds:  vitamin C  500 mg Per Tube Daily   baclofen  2.5 mg Per Tube TID   carbamazepine  100 mg Per Tube BID   chlorhexidine  15 mL Mouth Rinse BID   Chlorhexidine Gluconate Cloth  6 each Topical Q0600   docusate  100 mg Per Tube BID   feeding supplement (PROSource TF)  45 mL Per Tube BID   free  water  75 mL Per Tube Q4H   gabapentin  100 mg Per Tube Q12H   heparin  5,000 Units Subcutaneous Q8H   hydrocortisone sod succinate (SOLU-CORTEF) inj  100 mg Intravenous Q8H   insulin aspart  0-9 Units Subcutaneous Q4H   levothyroxine  100 mcg Per Tube Q0600   mouth rinse  15 mL Mouth Rinse q12n4p   midodrine  10 mg Per Tube TID WC   oxyCODONE-acetaminophen  1 tablet Per Tube TID   polyethylene glycol  17 g Per Tube BID   sennosides  10 mL Per Tube QHS   sodium chloride flush  10-40 mL Intracatheter Q12H   Continuous Infusions:  cefTRIAXone (ROCEPHIN)  IV Stopped (07/06/21 1231)   feeding supplement (OSMOLITE 1.5 CAL) 1,000 mL (07/06/21 2107)   lactated ringers 50 mL/hr at 07/07/21 0306     LOS: 3 days    Osvaldo Shipper, MD [facility]  If 7PM-7AM, please contact night-coverage [email or web address]  07/07/2021, 10:06 AM
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
## 2021-07-08 DIAGNOSIS — K5900 Constipation, unspecified: Secondary | ICD-10-CM

## 2021-07-08 LAB — GLUCOSE, CAPILLARY
Glucose-Capillary: 115 mg/dL — ABNORMAL HIGH (ref 70–99)
Glucose-Capillary: 124 mg/dL — ABNORMAL HIGH (ref 70–99)
Glucose-Capillary: 128 mg/dL — ABNORMAL HIGH (ref 70–99)
Glucose-Capillary: 138 mg/dL — ABNORMAL HIGH (ref 70–99)
Glucose-Capillary: 162 mg/dL — ABNORMAL HIGH (ref 70–99)
Glucose-Capillary: 170 mg/dL — ABNORMAL HIGH (ref 70–99)
Glucose-Capillary: 177 mg/dL — ABNORMAL HIGH (ref 70–99)

## 2021-07-08 LAB — BASIC METABOLIC PANEL
Anion gap: 4 — ABNORMAL LOW (ref 5–15)
BUN: 30 mg/dL — ABNORMAL HIGH (ref 6–20)
CO2: 29 mmol/L (ref 22–32)
Calcium: 8.4 mg/dL — ABNORMAL LOW (ref 8.9–10.3)
Chloride: 112 mmol/L — ABNORMAL HIGH (ref 98–111)
Creatinine, Ser: 0.51 mg/dL (ref 0.44–1.00)
GFR, Estimated: >60 mL/min (ref >60)
Glucose, Bld: 154 mg/dL — ABNORMAL HIGH (ref 70–99)
Potassium: 3.7 mmol/L (ref 3.5–5.1)
Sodium: 145 mmol/L (ref 135–145)

## 2021-07-08 MED ORDER — SODIUM CHLORIDE 0.9 % IV SOLN
3.0000 g | Freq: Four times a day (QID) | INTRAVENOUS | Status: DC
  Administered 2021-07-08 – 2021-07-12 (×16): 3 g via INTRAVENOUS
  Filled 2021-07-08 (×3): qty 8
  Filled 2021-07-08 (×2): qty 3
  Filled 2021-07-08: qty 8
  Filled 2021-07-08: qty 3
  Filled 2021-07-08 (×3): qty 8
  Filled 2021-07-08 (×2): qty 3
  Filled 2021-07-08 (×5): qty 8
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
## 2021-07-08 NOTE — Plan of Care (Signed)

## 2021-07-08 NOTE — Consult Note (Addendum)
WOC Nurse Consult Note requested for leg and sacrum wounds.  This was already performed on 9/12 [facility]; please refer to previous consult note for assessment, and topical treatment orders have been provided for staff nurses to perform. Please re-consult if further assistance is needed.  Thank-you,  Cammie Mcgee MSN, RN, CWOCN, [place], CNS [phone]

## 2021-07-08 NOTE — [place] FL2 (Signed)
[place] MEDICAID [id number] LEVEL OF CARE SCREENING TOOL     IDENTIFICATION  Patient Name: Joanne [name] Birthdate: 09/21/1971 Sex: female Admission Date (Current Location): 07/04/2021  [facility] and [place] Number:  Chiropodist and Address:  [facility], [address], [place], [place] [address]      Provider Number: [id number]  Attending Physician Name and Address:  Osvaldo Shipper, MD  Relative Name and Phone Number:  Legal Guardian [name] [phone]    Current Level of Care: Hospital Recommended Level of Care: Skilled Nursing Facility Prior Approval Number:    Date Approved/Denied:   PASRR Number: [id number]  Discharge Plan: SNF    Current Diagnoses: Patient Active Problem List   Diagnosis Date Noted   HCAP (healthcare-associated pneumonia) 07/04/2021   Aspiration pneumonia (HCC) 07/04/2021   Chronic diastolic CHF (congestive heart failure) (HCC) 07/04/2021   Acute metabolic encephalopathy 07/04/2021   Hyperkalemia 07/04/2021   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/04/2021   Wound, open, in back and left leg 07/04/2021   Chronic indwelling Foley catheter    Leaking PEG tube (HCC)    Failure to thrive in adult 04/24/2021   Requires assistance with all daily activities 04/24/2021   G tube feedings (HCC) 04/24/2021   Closed injury of head 09/06/2020   Seizure disorder (HCC) 09/06/2020   Vitamin D deficiency 09/06/2020   Mild malnutrition (HCC)    Protein-calorie malnutrition, severe 05/20/2020   Weakness    Full code status    Hypokalemia 05/16/2020   Severe sepsis with septic shock (HCC) 05/15/2020   Sacral decubitus ulcer, stage IV (HCC)    Goals of care, counseling/discussion    Palliative care by specialist    DNR (do not resuscitate) discussion    Septic shock (HCC) 03/02/2020   Hypernatremia 11/15/2019   Thrombocytopenia (HCC) 11/15/2019   AKI (acute kidney injury) (HCC) 11/15/2019    HTN (hypertension) 11/15/2019   Non-insulin dependent type 2 diabetes mellitus (HCC) 11/15/2019   Hypothyroidism 11/15/2019   Severe sepsis with acute organ dysfunction (HCC) 11/15/2019   Left hemiplegia (HCC) 11/15/2019   Pressure injury of skin 11/15/2019   Aspiration pneumonia due to gastric secretions (HCC)    Acute kidney injury (AKI) with acute tubular necrosis (ATN) (HCC)    Traumatic brain injury (HCC)    Abdominal pain 08/05/2019   Vomiting 08/05/2019   Acute on chronic respiratory failure with hypoxia (HCC) 06/06/2018   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   Pneumonia due to Klebsiella pneumoniae (HCC) 06/06/2018   Acute on chronic combined systolic and diastolic CHF (congestive heart failure) (HCC) 06/06/2018   Sepsis secondary to UTI (HCC) 06/06/2018   ARDS (adult respiratory distress syndrome) (HCC) 06/06/2018   Acute on chronic respiratory failure with hypoxia (HCC) 06/06/2018   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   Iron deficiency anemia 05/20/2018   Gross hematuria 01/18/2018   Urinary retention 01/18/2018   Acute respiratory distress 10/23/2012   Cardiorespiratory arrest (HCC) 10/23/2012   Acute blood loss anemia 10/20/2012   Hypophosphatemia 10/20/2012   Leukopenia 10/20/2012   Altered mental status 10/19/2012   Duodenal obstruction 10/19/2012    Orientation RESPIRATION BLADDER Height & Weight     Self, Place  Normal Indwelling catheter Weight: 69.1 kg Height:  5\' 5"  (165.1 cm)  BEHAVIORAL SYMPTOMS/MOOD NEUROLOGICAL BOWEL NUTRITION STATUS    Convulsions/Seizures   Diet (Peg Tube)  AMBULATORY STATUS COMMUNICATION OF NEEDS Skin   Total Care Verbally Other (Comment) (Stage 4 to  sacrum, stage 3 to left inferior scapula tip, full thickness to LLE; all chronic in nature)                       Personal Care Assistance Level of Assistance  Total care       Total Care Assistance: Maximum assistance   Functional Limitations Info              SPECIAL CARE FACTORS FREQUENCY                       Contractures Contractures Info: Not present    Additional Factors Info  Code Status, Allergies Code Status Info: full code Allergies Info: Kaopectate  (Attapulgite), Thiopental, Gatifloxacin, Ceftin (Cefuroxime), Tomato           Current Medications (07/08/2021):  This is the current hospital active medication list Current Facility-Administered Medications  Medication Dose Route Frequency Provider Last Rate Last Admin   acetaminophen (TYLENOL) tablet 650 mg  650 mg Per Tube Q6H PRN Lorretta Harp, MD       albuterol (PROVENTIL) (2.5 MG/3ML) 0.083% nebulizer solution 2.5 mg  2.5 mg Nebulization Q4H PRN Lorretta Harp, MD       Ampicillin-Sulbactam (UNASYN) 3 g in sodium chloride 0.9 % 100 mL IVPB  3 g Intravenous Q6H Ravishankar, Rhodia Albright, MD       ascorbic acid (VITAMIN C) tablet 500 mg  500 mg Per Tube Daily Erin Fulling, MD   500 mg at 07/08/21 0850   baclofen (OZOBAX) 1 mg/mL oral solution 2.5 mg  2.5 mg Per Tube TID Lorretta Harp, MD   2.5 mg at 07/07/21 2321   bisacodyl (DULCOLAX) EC tablet 10 mg  10 mg Oral QHS PRN Lorretta Harp, MD       carbamazepine (TEGRETOL) chewable tablet 100 mg  100 mg Per Tube BID Erin Fulling, MD   100 mg at 07/08/21 0850   chlorhexidine (PERIDEX) 0.12 % solution 15 mL  15 mL Mouth Rinse BID Lorretta Harp, MD   15 mL at 07/08/21 0850   Chlorhexidine Gluconate Cloth 2 % PADS 6 each  6 each Topical Q0600 Lorretta Harp, MD   6 each at 07/05/21 1540   docusate (COLACE) 50 MG/5ML liquid 100 mg  100 mg Per Tube BID Lianne Cure, NP   100 mg at 07/08/21 0851   feeding supplement (OSMOLITE 1.5 CAL) liquid 1,000 mL  1,000 mL Per Tube Continuous Erin Fulling, MD 50 mL/hr at 07/07/21 2338 1,000 mL at 07/07/21 2338   feeding supplement (PROSource TF) liquid 45 mL  45 mL Per Tube BID Erin Fulling, MD   45 mL at 07/07/21 1000   free water 75 mL  75 mL Per Tube Q4H Erin Fulling, MD   75 mL at 07/08/21 0852   gabapentin  (NEURONTIN) 250 MG/5ML solution 100 mg  100 mg Per Tube Q12H Dorothea Ogle B, RPH   100 mg at 07/08/21 0010   heparin injection 5,000 Units  5,000 Units Subcutaneous Q8H Lorretta Harp, MD   5,000 Units at 07/08/21 0513   hydrocortisone sodium succinate (SOLU-CORTEF) 100 MG injection 50 mg  50 mg Intravenous Q8H Osvaldo Shipper, MD   50 mg at 07/08/21 0510   insulin aspart (novoLOG) injection 0-9 Units  0-9 Units Subcutaneous Q4H Manuela Schwartz, NP   2 Units at 07/08/21 0851   levothyroxine (SYNTHROID) tablet 100 mcg  100 mcg Per Tube Q0600 Tressie Ellis, [facility]  100 mcg at 07/08/21 0511   LORazepam (ATIVAN) injection 1 mg  1 mg Intravenous Q2H PRN Lorretta Harp, MD       MEDLINE mouth rinse  15 mL Mouth Rinse q12n4p Lorretta Harp, MD   15 mL at 07/07/21 1558   midodrine (PROAMATINE) tablet 10 mg  10 mg Per Tube TID WC Dorothea Ogle B, RPH   10 mg at 07/08/21 0850   morphine 2 MG/ML injection 2 mg  2 mg Intravenous Q2H PRN Manuela Schwartz, NP   2 mg at 07/06/21 0338   ondansetron (ZOFRAN) injection 4 mg  4 mg Intravenous Q8H PRN Lorretta Harp, MD       oxyCODONE-acetaminophen (PERCOCET/ROXICET) 5-325 MG per tablet 1 tablet  1 tablet Per Tube TID Erin Fulling, MD   1 tablet at 07/08/21 0850   polyethylene glycol (MIRALAX / GLYCOLAX) packet 17 g  17 g Per Tube TID Osvaldo Shipper, MD   17 g at 07/08/21 0850   sennosides (SENOKOT) 8.8 MG/5ML syrup 10 mL  10 mL Per Tube BID Osvaldo Shipper, MD   10 mL at 07/08/21 0851   sodium chloride flush (NS) 0.9 % injection 10-40 mL  10-40 mL Intracatheter Q12H Lorretta Harp, MD   10 mL at 07/07/21 2326     Discharge Medications: Please see discharge summary for a list of discharge medications.  Relevant Imaging Results:  Relevant Lab Results:   Additional Information SS#[id number]  Barrie Dunker, RN
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
## 2021-07-09 LAB — GLUCOSE, CAPILLARY
Glucose-Capillary: 159 mg/dL — ABNORMAL HIGH (ref 70–99)
Glucose-Capillary: 135 mg/dL — ABNORMAL HIGH (ref 70–99)
Glucose-Capillary: 152 mg/dL — ABNORMAL HIGH (ref 70–99)
Glucose-Capillary: 135 mg/dL — ABNORMAL HIGH (ref 70–99)
Glucose-Capillary: 108 mg/dL — ABNORMAL HIGH (ref 70–99)

## 2021-07-09 LAB — CBC
HCT: 32.5 % — ABNORMAL LOW (ref 36.0–46.0)
Hemoglobin: 10 g/dL — ABNORMAL LOW (ref 12.0–15.0)
MCH: 27.7 pg (ref 26.0–34.0)
MCHC: 30.8 g/dL (ref 30.0–36.0)
MCV: 90 fL (ref 80.0–100.0)
Platelets: 255 10*3/uL (ref 150–400)
RBC: 3.61 MIL/uL — ABNORMAL LOW (ref 3.87–5.11)
RDW: 15 % (ref 11.5–15.5)
WBC: 12.6 10*3/uL — ABNORMAL HIGH (ref 4.0–10.5)
nRBC: 0.2 % (ref 0.0–0.2)

## 2021-07-09 MED ORDER — BACITRACIN-NEOMYCIN-POLYMYXIN 400-5-5000 EX OINT
TOPICAL_OINTMENT | Freq: Three times a day (TID) | CUTANEOUS | Status: DC
  Administered 2021-07-10 – 2021-07-11 (×2): 1 via TOPICAL
  Filled 2021-07-09 (×11): qty 1

## 2021-07-09 MED ORDER — POLYETHYLENE GLYCOL 3350 17 G PO PACK
17.0000 g | PACK | Freq: Two times a day (BID) | ORAL | Status: DC
  Administered 2021-07-09 – 2021-07-12 (×2): 17 g
  Filled 2021-07-09 (×2): qty 1

## 2021-07-09 MED ORDER — HYDROCORTISONE SOD SUC (PF) 100 MG IJ SOLR
25.0000 mg | Freq: Three times a day (TID) | INTRAMUSCULAR | Status: AC
  Administered 2021-07-09 – 2021-07-10 (×5): 25 mg via INTRAVENOUS
  Filled 2021-07-09 (×6): qty 0.5

## 2021-07-09 NOTE — Plan of Care (Signed)
Patient has had several large loose bowel movements. Patient is alert and oriented x 4. She did report having back pain which is chronic. Vital signs, and blood sugars are stable. Will continue to monitor.  Problem: Education: Goal: Knowledge of General Education information will improve Description: Including pain rating scale, medication(s)/side effects and non-pharmacologic comfort measures Outcome: Progressing   Problem: Health Behavior/Discharge Planning: Goal: Ability to manage health-related needs will improve Outcome: Progressing   Problem: Clinical Measurements: Goal: Ability to maintain clinical measurements within normal limits will improve Outcome: Progressing Goal: Will remain free from infection Outcome: Progressing
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
## 2021-07-09 NOTE — Progress Notes (Deleted)
Spoke with Richarda Overlie, MD in IR.  OK to use for meds and feeding.

## 2021-07-09 NOTE — Plan of Care (Signed)

## 2021-07-09 NOTE — Progress Notes (Signed)
   07/09/21 1800  Pain Assessment  Pain Scale 0-10  Pain Score 7  Pain Type Chronic pain  Pain Location Generalized  Pain Descriptors / Indicators Aching  Pain Frequency Constant  Pain Onset On-going  Patients Stated Pain Goal 3  Pain Intervention(s) Medication (See eMAR)  POSS Scale (Pasero Opioid Sedation Scale)  POSS *See Group Information* 1-Acceptable,Awake and alert  Provider Notification  Provider Name/Title Colin Broach  Date Provider Notified 07/09/21  Time Provider Notified 1844  Notification Type Page  Notification Reason  (Yellow discharge from Gtube insertion site.  Change dressing 3 times this afternoon.)  Provider response See new orders  Date of Provider Response 07/09/21  Time of Provider Response 1846

## 2021-07-10 ENCOUNTER — Inpatient Hospital Stay: Payer: Medicare Other

## 2021-07-10 LAB — BASIC METABOLIC PANEL
Anion gap: 6 (ref 5–15)
BUN: 24 mg/dL — ABNORMAL HIGH (ref 6–20)
CO2: 29 mmol/L (ref 22–32)
Calcium: 8.2 mg/dL — ABNORMAL LOW (ref 8.9–10.3)
Chloride: 109 mmol/L (ref 98–111)
Creatinine, Ser: 0.48 mg/dL (ref 0.44–1.00)
GFR, Estimated: >60 mL/min (ref >60)
Glucose, Bld: 137 mg/dL — ABNORMAL HIGH (ref 70–99)
Potassium: 3.7 mmol/L (ref 3.5–5.1)
Sodium: 144 mmol/L (ref 135–145)

## 2021-07-10 LAB — GLUCOSE, CAPILLARY
Glucose-Capillary: 104 mg/dL — ABNORMAL HIGH (ref 70–99)
Glucose-Capillary: 104 mg/dL — ABNORMAL HIGH (ref 70–99)
Glucose-Capillary: 129 mg/dL — ABNORMAL HIGH (ref 70–99)
Glucose-Capillary: 143 mg/dL — ABNORMAL HIGH (ref 70–99)
Glucose-Capillary: 85 mg/dL (ref 70–99)
Glucose-Capillary: 89 mg/dL (ref 70–99)
Glucose-Capillary: 98 mg/dL (ref 70–99)

## 2021-07-10 LAB — T4, FREE: Free T4: 0.93 ng/dL (ref 0.61–1.12)

## 2021-07-10 LAB — TSH: TSH: 8.027 u[IU]/mL — ABNORMAL HIGH (ref 0.350–4.500)

## 2021-07-10 MED ORDER — DEXTROSE-NACL 5-0.45 % IV SOLN: INTRAVENOUS | Status: AC

## 2021-07-10 NOTE — Plan of Care (Signed)

## 2021-07-10 NOTE — Progress Notes (Signed)
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 Tube BID   sodium chloride flush  10-40 mL Intracatheter Q12H   Continuous Infusions:  ampicillin-sulbactam (UNASYN) IV 3 g (07/10/21 0532)     LOS: 6 days    Osvaldo Shipper, MD [facility]  If 7PM-7AM, please contact night-coverage [email or web address]  07/10/2021, 10:38 AM

## 2021-07-10 NOTE — Progress Notes (Signed)
No adverse events during shift. Peg tube patent with excessive drainage noted weeping from site, bandage changed frequently. Remains on continuous tube feedings with no distress. Vitals and BS stable. Will continue to monitor.

## 2021-07-10 NOTE — Progress Notes (Signed)
   07/10/21 0705  Pain Assessment  Pain Scale 0-10  Pain Score 7  Pain Type Chronic pain  Pain Location Generalized  Pain Descriptors / Indicators Aching  Pain Intervention(s) Repositioned  POSS Scale ([place] Opioid Sedation Scale)  POSS *See Group Information* 1-Acceptable,Awake and alert  ECG Monitoring  PR interval 0.14  QRS interval 0.1  QT interval 0.35  QTc interval 0.41  CV Strip Heart Rate 84  Cardiac Rhythm NSR  Provider Notification  Provider Name/Title Colin Broach  Date Provider Notified 07/10/21  Time Provider Notified 0730  Notification Type Face-to-face  Notification Reason  (G-Tube drainage -> getting xray, stop tf until after xray.)  Provider response See new orders  Date of Provider Response 07/10/21  Time of Provider Response 0730

## 2021-07-11 ENCOUNTER — Inpatient Hospital Stay: Payer: Medicare Other

## 2021-07-11 ENCOUNTER — Inpatient Hospital Stay: Payer: Medicare Other | Admitting: Radiology

## 2021-07-11 ENCOUNTER — Inpatient Hospital Stay (HOSPITAL_COMMUNITY): Payer: BC Managed Care – PPO

## 2021-07-11 DIAGNOSIS — J690 Pneumonitis due to inhalation of food and vomit: Secondary | ICD-10-CM | POA: Diagnosis not present

## 2021-07-11 DIAGNOSIS — R7881 Bacteremia: Secondary | ICD-10-CM | POA: Diagnosis not present

## 2021-07-11 HISTORY — PX: IR REPLC GASTRO/COLONIC TUBE PERCUT W/FLUORO: IMG2333

## 2021-07-11 LAB — CBC
HCT: 28.2 % — ABNORMAL LOW (ref 36.0–46.0)
Hemoglobin: 8.9 g/dL — ABNORMAL LOW (ref 12.0–15.0)
MCH: 27.6 pg (ref 26.0–34.0)
MCHC: 31.6 g/dL (ref 30.0–36.0)
MCV: 87.6 fL (ref 80.0–100.0)
Platelets: 313 10*3/uL (ref 150–400)
RBC: 3.22 MIL/uL — ABNORMAL LOW (ref 3.87–5.11)
RDW: 14.9 % (ref 11.5–15.5)
WBC: 13.6 10*3/uL — ABNORMAL HIGH (ref 4.0–10.5)
nRBC: 0.4 % — ABNORMAL HIGH (ref 0.0–0.2)

## 2021-07-11 LAB — GLUCOSE, CAPILLARY
Glucose-Capillary: 102 mg/dL — ABNORMAL HIGH (ref 70–99)
Glucose-Capillary: 129 mg/dL — ABNORMAL HIGH (ref 70–99)
Glucose-Capillary: 87 mg/dL (ref 70–99)
Glucose-Capillary: 87 mg/dL (ref 70–99)
Glucose-Capillary: 87 mg/dL (ref 70–99)
Glucose-Capillary: 99 mg/dL (ref 70–99)

## 2021-07-11 LAB — CULTURE, BLOOD (ROUTINE X 2)
Culture: NO GROWTH
Culture: NO GROWTH
Special Requests: ADEQUATE
Special Requests: ADEQUATE

## 2021-07-11 MED ORDER — IOHEXOL 300 MG/ML  SOLN
40.0000 mL | Freq: Once | INTRAMUSCULAR | Status: AC | PRN
  Administered 2021-07-11: 130 mL

## 2021-07-11 MED ORDER — ENOXAPARIN SODIUM 40 MG/0.4ML IJ SOSY
40.0000 mg | PREFILLED_SYRINGE | INTRAMUSCULAR | Status: DC
  Administered 2021-07-11: 40 mg via SUBCUTANEOUS
  Filled 2021-07-11: qty 0.4

## 2021-07-11 MED ORDER — OSMOLITE 1.2 CAL PO LIQD
1000.0000 mL | ORAL | Status: DC
  Administered 2021-07-11: 1000 mL

## 2021-07-11 NOTE — Progress Notes (Signed)
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 Tube BID   sennosides  10 mL Per Tube BID   sodium chloride flush  10-40 mL Intracatheter Q12H   Continuous Infusions:  ampicillin-sulbactam (UNASYN) IV 3 g (07/11/21 0424)   dextrose 5 % and 0.45% NaCl 50 mL/hr at 07/10/21 1512     LOS: 7 days    Osvaldo Shipper, MD [facility]  If 7PM-7AM, please contact night-coverage [email or web address]  07/11/2021, 10:01 AM

## 2021-07-11 NOTE — TOC Progression Note (Signed)
Transition of Care [facility]) - Progression Note    Patient Details  Name: Joanne [name] MRN: [id number] Date of Birth: February 09, 1971  Transition of Care [facility]) CM/SW Contact  Barrie Dunker, RN Phone Number: 07/11/2021, 11:03 AM  Clinical Narrative:   TOC continues to monitor for needs, the plan remains to return to [facility] when she is ready to DC         Expected Discharge Plan and Services                                                 Social Determinants of Health (SDOH) Interventions    Readmission Risk Interventions Readmission Risk Prevention Plan 03/10/2020 03/03/2020  Transportation Screening Complete Complete  PCP or Specialist Appt within 3-5 Days Complete Complete  HRI or Home Care Consult Complete Complete  Social Work Consult for Recovery Care Planning/Counseling Complete -  Palliative Care Screening Not Applicable Complete  Medication Review Oceanographer) Complete Complete  Some recent data might be hidden

## 2021-07-11 NOTE — Procedures (Signed)
Pre procedural Dx: Dysphagia, poorly functioning feeding tube. Post procedural Dx: Same  Successful fluoroscopic guided exchange, repositioning and up-sizing of now 20 Fr Balloon retention G-tube. Please maintain G-tube to low wall suction when not in use.  The feeding tube is ready for immediate use.  EBL: None Complications: None immediate.  Katherina Right, MD Pager #: [phone]

## 2021-07-11 NOTE — Progress Notes (Signed)
Date of Admission:  07/04/2021    ID: Joanne [name] is a 50 y.o. female  Principal Problem:   Aspiration pneumonia (HCC) Active Problems:   Tracheostomy status (HCC)   Traumatic brain injury (HCC)   AKI (acute kidney injury) (HCC)   Hypothyroidism   Sacral decubitus ulcer, stage IV (HCC)   Severe sepsis with septic shock (HCC)   Iron deficiency anemia   Seizure disorder (HCC)   G tube feedings (HCC)   HCAP (healthcare-associated pneumonia)   Chronic diastolic CHF (congestive heart failure) (HCC)   Acute metabolic encephalopathy   Hyperkalemia   Type II diabetes mellitus with renal manifestations (HCC)   Chronic indwelling Foley catheter   Wound, open, in back and left leg    Subjective: Doing better More alert and awake Had PEG replaced today  Medications:   baclofen  2.5 mg Per Tube TID   carbamazepine  100 mg Per Tube BID   chlorhexidine  15 mL Mouth Rinse BID   Chlorhexidine Gluconate Cloth  6 each Topical Q0600   docusate  100 mg Per Tube BID   enoxaparin (LOVENOX) injection  40 mg Subcutaneous Q24H   free water  75 mL Per Tube Q4H   gabapentin  100 mg Per Tube Q12H   insulin aspart  0-9 Units Subcutaneous Q4H   levothyroxine  100 mcg Per Tube Q0600   mouth rinse  15 mL Mouth Rinse q12n4p   midodrine  10 mg Per Tube TID WC   neomycin-bacitracin-polymyxin   Topical TID   oxyCODONE-acetaminophen  1 tablet Per Tube TID   polyethylene glycol  17 g Per Tube BID   sennosides  10 mL Per Tube BID   sodium chloride flush  10-40 mL Intracatheter Q12H    Objective: Vital signs in last 24 hours: Temp:  [97.8 F (36.6 C)-98.5 F (36.9 C)] 98.5 F (36.9 C) (09/19 1138) Pulse Rate:  [84-91] 91 (09/19 1138) Resp:  [15-19] 19 (09/19 1138) BP: (125-158)/(72-92) 142/92 (09/19 1138) SpO2:  [90 %-93 %] 91 % (09/19 1138)  PHYSICAL EXAM:  General: Alert, cooperative, no distress,  Lungs: Clear to auscultation bilaterally. No Wheezing or Rhonchi. No rales. Heart:  Regular rate and rhythm, no murmur, rub or gallop. Abdomen: peg in place [facility] cath Sacral decubitus  Extremities: contractures of all four extremities Skin: No rashes or lesions. Or bruising Lymph: Cervical, supraclavicular normal. Neurologic: quadriplegia  Lab Results Recent Labs    07/09/21 0538 07/10/21 0544 07/11/21 0443  WBC 12.6*  --  13.6*  HGB 10.0*  --  8.9*  HCT 32.5*  --  28.2*  NA  --  144  --   K  --  3.7  --   CL  --  109  --   CO2  --  29  --   BUN  --  24*  --   CREATININE  --  0.48  --    Liver Panel No results for input(s): PROT, ALBUMIN, AST, ALT, ALKPHOS, BILITOT, BILIDIR, IBILI in the last 72 hours. Sedimentation Rate No results for input(s): ESRSEDRATE in the last 72 hours. C-Reactive Protein No results for input(s): CRP in the last 72 hours.  Microbiology:  Studies/Results: DG Abd Portable 2V  Result Date: 07/10/2021 CLINICAL DATA:  Evaluate PEG tube placement. Excessive drainage from PEG tube and weeping from site. EXAM: PORTABLE ABDOMEN - 2 VIEW COMPARISON:  KUB and CT scan July 05, 2021 FINDINGS: There is apparent contrast within gauze around the  external portion of the PEG tube on all 3 images obtained today. On the final image, there is linear high attenuation vertically oriented in the low mid abdomen not seen on the first 2 images. Extraluminal contrast is not excluded in this region. The remainder of contrast is seen in small bowel. IMPRESSION: 1. Given apparent contrast in the region of the gauze around the external portion of the PEG tube as well as the vertically oriented high attenuation on the final image which can not be confirmed to be intraluminal, leakage is not excluded. If there is clinical concern for leak, further assessment is warranted as extraluminal spill of contrast is not excluded today. These results will be called to the ordering clinician or representative by the Radiologist Assistant, and communication documented in the  PACS or [employer]. Electronically Signed   By: Gerome Sam III M.D.   On: 07/10/2021 10:15     Assessment/Plan: Aspiration pneumonia Proteus bacteremia Stage IV sacral decubitus Continue IV unasyn for 2 weeks ( can do PO augmenitn after a week of IV) TBI with quadriparesis PEG Neurogenic bladder- has foley  Discussed the management with care team

## 2021-07-11 NOTE — Plan of Care (Signed)

## 2021-07-12 LAB — BASIC METABOLIC PANEL
Anion gap: 6 (ref 5–15)
BUN: 13 mg/dL (ref 6–20)
CO2: 30 mmol/L (ref 22–32)
Calcium: 8.3 mg/dL — ABNORMAL LOW (ref 8.9–10.3)
Chloride: 108 mmol/L (ref 98–111)
Creatinine, Ser: 0.44 mg/dL (ref 0.44–1.00)
GFR, Estimated: >60 mL/min (ref >60)
Glucose, Bld: 139 mg/dL — ABNORMAL HIGH (ref 70–99)
Potassium: 3.6 mmol/L (ref 3.5–5.1)
Sodium: 144 mmol/L (ref 135–145)

## 2021-07-12 LAB — GLUCOSE, CAPILLARY
Glucose-Capillary: 115 mg/dL — ABNORMAL HIGH (ref 70–99)
Glucose-Capillary: 147 mg/dL — ABNORMAL HIGH (ref 70–99)

## 2021-07-12 LAB — CBC
HCT: 33.5 % — ABNORMAL LOW (ref 36.0–46.0)
Hemoglobin: 10.8 g/dL — ABNORMAL LOW (ref 12.0–15.0)
MCH: 28.8 pg (ref 26.0–34.0)
MCHC: 32.2 g/dL (ref 30.0–36.0)
MCV: 89.3 fL (ref 80.0–100.0)
Platelets: 328 10*3/uL (ref 150–400)
RBC: 3.75 MIL/uL — ABNORMAL LOW (ref 3.87–5.11)
RDW: 16.6 % — ABNORMAL HIGH (ref 11.5–15.5)
WBC: 13.6 10*3/uL — ABNORMAL HIGH (ref 4.0–10.5)
nRBC: 0 % (ref 0.0–0.2)

## 2021-07-12 LAB — RESP PANEL BY RT-PCR (FLU A&B, COVID) ARPGX2
Influenza A by PCR: NEGATIVE
Influenza B by PCR: NEGATIVE
SARS Coronavirus 2 by RT PCR: NEGATIVE

## 2021-07-12 MED ORDER — ALBUTEROL SULFATE (2.5 MG/3ML) 0.083% IN NEBU: 2.5000 mg | INHALATION_SOLUTION | RESPIRATORY_TRACT | 12 refills | Status: AC | PRN

## 2021-07-12 MED ORDER — LEVOTHYROXINE SODIUM 112 MCG PO TABS: 112.0000 ug | ORAL_TABLET | Freq: Every day | ORAL | Status: AC

## 2021-07-12 MED ORDER — OSMOLITE 1.2 CAL PO LIQD: 1000.0000 mL | ORAL | 0 refills | Status: DC

## 2021-07-12 MED ORDER — AMOXICILLIN-POT CLAVULANATE 400-57 MG/5ML PO SUSR
800.0000 mg | Freq: Two times a day (BID) | ORAL | Status: DC
  Administered 2021-07-12: 800 mg
  Filled 2021-07-12 (×2): qty 10

## 2021-07-12 MED ORDER — DOCUSATE SODIUM 50 MG/5ML PO LIQD: 100.0000 mg | Freq: Two times a day (BID) | ORAL | 0 refills | Status: DC

## 2021-07-12 MED ORDER — SENNOSIDES 8.8 MG/5ML PO SYRP: 10.0000 mL | ORAL_SOLUTION | Freq: Two times a day (BID) | ORAL | 0 refills | Status: DC

## 2021-07-12 MED ORDER — POLYETHYLENE GLYCOL 3350 17 G PO PACK: 17.0000 g | PACK | Freq: Two times a day (BID) | ORAL | 0 refills | Status: AC

## 2021-07-12 MED ORDER — OXYCODONE-ACETAMINOPHEN 5-325 MG PO TABS: 1.0000 | ORAL_TABLET | Freq: Three times a day (TID) | ORAL | 0 refills | Status: DC

## 2021-07-12 MED ORDER — FREE WATER: 75.0000 mL | Status: DC

## 2021-07-12 MED ORDER — AMOXICILLIN-POT CLAVULANATE 400-57 MG/5ML PO SUSR: 800.0000 mg | Freq: Two times a day (BID) | ORAL | 0 refills | Status: AC

## 2021-07-12 NOTE — TOC Progression Note (Signed)
Transition of Care [facility]) - Progression Note    Patient Details  Name: Joanne [name] MRN: [id number] Date of Birth: December 16, 1970  [facility] CM/SW Contact  Barrie Dunker, RN Phone Number: 07/12/2021, 1:10 PM  Clinical Narrative:        [name] the patient's legal guardian that the patient will be returning to [facility] today, she stated understanding  [facility] was called to transport     Expected Discharge Plan and Services           Expected Discharge Date: 07/12/21                                     Social Determinants of Health (SDOH) Interventions    Readmission Risk Interventions Readmission Risk Prevention Plan 03/10/2020 03/03/2020  Transportation Screening Complete Complete  PCP or Specialist Appt within 3-5 Days Complete Complete  HRI or Home Care Consult Complete Complete  Social Work Consult for Recovery Care Planning/Counseling Complete -  Palliative Care Screening Not Applicable Complete  Medication Review Oceanographer) Complete Complete  Some recent data might be hidden
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
## 2021-07-12 NOTE — Care Management Important Message (Signed)
Important Message  Patient Details  Name: Joanne [name] MRN: [id number] Date of Birth: [birth year]   Medicare Important Message Given:  Yes  I reviewed the Important Message from Medicare with [name] by phone [phone]) and she is in agreement with the discharge plan.  I asked if she would like to have a copy and she replied, yes.  I sent via secure e-mail to:  [email or web address] as requested.  I thanked her for her time.   [name] 07/12/2021, 11:46 AM

## 2021-07-25 ENCOUNTER — Emergency Department: Payer: Medicare Other

## 2021-07-25 ENCOUNTER — Emergency Department
Admission: EM | Admit: 2021-07-25 | Discharge: 2021-07-25 | Disposition: A | Discharge status: Home / Self Care | Payer: Medicare Other | Attending: Emergency Medicine | Admitting: Emergency Medicine

## 2021-07-25 ENCOUNTER — Other Ambulatory Visit: Payer: Self-pay

## 2021-07-25 DIAGNOSIS — E039 Hypothyroidism, unspecified: Secondary | ICD-10-CM | POA: Diagnosis not present

## 2021-07-25 DIAGNOSIS — I110 Hypertensive heart disease with heart failure: Secondary | ICD-10-CM | POA: Diagnosis not present

## 2021-07-25 DIAGNOSIS — T85528A Displacement of other gastrointestinal prosthetic devices, implants and grafts, initial encounter: Secondary | ICD-10-CM

## 2021-07-25 DIAGNOSIS — I5043 Acute on chronic combined systolic (congestive) and diastolic (congestive) heart failure: Secondary | ICD-10-CM | POA: Diagnosis not present

## 2021-07-25 DIAGNOSIS — E119 Type 2 diabetes mellitus without complications: Secondary | ICD-10-CM | POA: Diagnosis not present

## 2021-07-25 DIAGNOSIS — Z79899 Other long term (current) drug therapy: Secondary | ICD-10-CM | POA: Diagnosis not present

## 2021-07-25 DIAGNOSIS — Z452 Encounter for adjustment and management of vascular access device: Secondary | ICD-10-CM | POA: Diagnosis not present

## 2021-07-25 DIAGNOSIS — K9423 Gastrostomy malfunction: Secondary | ICD-10-CM | POA: Diagnosis present

## 2021-07-25 MED ORDER — DIATRIZOATE MEGLUMINE & SODIUM 66-10 % PO SOLN
30.0000 mL | Freq: Once | ORAL | Status: AC
  Administered 2021-07-25: 30 mL

## 2021-07-25 NOTE — ED Triage Notes (Signed)
Pt comes into the ED via EMS from [facility] with displaced peg tub, also requesting from staff to remove the pt PICC line  73HR 100%RA

## 2021-07-25 NOTE — ED Notes (Signed)
[facility] called for transport back to [facility] 

## 2021-07-26 NOTE — ED Provider Notes (Signed)
[facility] Emergency Department Provider Note   ____________________________________________   Event Date/Time   First MD Initiated Contact with Patient 07/25/21 1743     (approximate)  I have reviewed the triage vital signs and the nursing notes.   HISTORY  Chief Complaint G-Tube fell out    HPI Joanne [name] is a 50 y.o. female who presents via EMS from her long-term care facility after her G-tube was dislodged.  Patient at her baseline mental status is unable to reliably answer history and review of systems questions however per EMS from staff at her long-term care facility, patient got this G-tube caught on something in her room and pulled it out of her stomach.  EMS state that staff also asked that her right upper extremity PICC line be removed while she was here in the hospital.          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   Tra[facility] injury [facility])     Patient Active Problem List   Diagnosis Date Noted   HCAP (healthcare-associated pneumonia) 07/04/2021   Aspiration pneumonia (HCC) 07/04/2021   Chronic diastolic CHF (congestive heart failure) (HCC) 07/04/2021   Acute metabolic encephalopathy 07/04/2021   Hyperkalemia 07/04/2021   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/04/2021   Wound, open, in back and left leg 07/04/2021    Chronic indwelling Foley catheter    Leaking PEG tube (HCC)    Failure to thrive in adult 04/24/2021   Requires assistance with all daily activities 04/24/2021   G tube feedings (HCC) 04/24/2021   Closed injury of head 09/06/2020   Seizure disorder (HCC) 09/06/2020   Vitamin D deficiency 09/06/2020   Mild malnutrition (HCC)    Protein-calorie malnutrition, severe 05/20/2020   Weakness    Full code status    Hypokalemia 05/16/2020   Severe sepsis with septic shock (HCC) 05/15/2020   Sacral decubitus ulcer, stage IV (HCC)    Goals of care, counseling/discussion    Palliative care by specialist    DNR (do not resuscitate) discussion    Septic shock (HCC) 03/02/2020   Hypernatremia 11/15/2019   Thrombocytopenia (HCC) 11/15/2019   AKI (acute kidney injury) (HCC) 11/15/2019   HTN (hypertension) 11/15/2019   Non-insulin dependent type 2 diabetes mellitus (HCC) 11/15/2019   Hypothyroidism 11/15/2019   Severe sepsis with acute organ dysfunction (HCC) 11/15/2019   Left hemiplegia (HCC) 11/15/2019   Pressure injury of skin 11/15/2019   Aspiration pneumonia due to gastric secretions (HCC)    Acute kidney injury (AKI) with acute tubular necrosis (ATN) (HCC)    Traumatic brain injury    Abdominal pain 08/05/2019   Vomiting 08/05/2019   Acute on chronic respiratory failure with hypoxia (HCC) 06/06/2018   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   Pneumonia due to Klebsiella pneumoniae (HCC) 06/06/2018   Acute on chronic combined systolic and diastolic CHF (congestive  heart failure) (HCC) 06/06/2018   Sepsis secondary to UTI (HCC) 06/06/2018   ARDS (adult respiratory distress syndrome) (HCC) 06/06/2018   Acute on chronic respiratory failure with hypoxia (HCC) 06/06/2018   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   Iron deficiency anemia 05/20/2018   Gross hematuria 01/18/2018   Urinary retention 01/18/2018   Acute respiratory distress 10/23/2012   Cardiorespiratory arrest (HCC) 10/23/2012    Acute blood loss anemia 10/20/2012   Hypophosphatemia 10/20/2012   Leukopenia 10/20/2012   Altered mental status 10/19/2012   Duodenal obstruction 10/19/2012    Past Surgical History:  Procedure Laterality Date   IR REPLC GASTRO/COLONIC TUBE PERCUT W/FLUORO  07/11/2021   PEG PLACEMENT N/A 05/27/2020   Procedure: PERCUTANEOUS ENDOSCOPIC GASTROSTOMY (PEG) PLACEMENT;  Surgeon: Midge Minium, MD;  Location: [facility] ENDOSCOPY;  Service: Endoscopy;  Laterality: N/A;   TRACHEOSTOMY      Prior to Admission medications   Medication Sig Start Date End Date Taking? Authorizing Provider  acetaminophen (TYLENOL) 325 MG tablet Take 650 mg by mouth every 8 (eight) hours as needed for mild pain or moderate pain.    [provider]  albuterol (PROVENTIL) (2.5 MG/3ML) 0.083% nebulizer solution Take 3 mLs (2.5 mg total) by nebulization every 4 (four) hours as needed for wheezing or shortness of breath. 07/12/21   Osvaldo Shipper, MD  baclofen (LIORESAL) 10 MG tablet Take 10 mg by mouth 3 (three) times daily. 07/19/20   [provider]  bisacodyl (DULCOLAX) 5 MG EC tablet Take 2 tablets (10 mg total) by mouth at bedtime as needed for moderate constipation. 04/30/21   Gillis Santa, MD  carbamazepine (TEGRETOL XR) 100 MG 12 hr tablet Take 50 mg by mouth 4 (four) times daily.    [provider]  docusate (COLACE) 50 MG/5ML liquid Place 10 mLs (100 mg total) into feeding tube 2 (two) times daily. 07/12/21   Osvaldo Shipper, MD  ferrous sulfate 325 (65 FE) MG tablet Take 325 mg by mouth daily with breakfast.    [provider]  gabapentin (NEURONTIN) 100 MG capsule Take 100 mg by mouth 2 (two) times daily.    [provider]  levothyroxine (SYNTHROID) 112 MCG tablet Take 1 tablet (112 mcg total) by mouth daily before breakfast. 07/12/21   Osvaldo Shipper, MD  midodrine (PROAMATINE) 10 MG tablet Place 1 tablet (10 mg total) into feeding tube 3 (three) times daily as needed (if SBP <  100). 04/30/21   Gillis Santa, MD  Nutritional Supplements (FEEDING SUPPLEMENT, OSMOLITE 1.2 CAL,) LIQD Place 1,000 mLs into feeding tube continuous. 07/12/21   Osvaldo Shipper, MD  oxyCODONE-acetaminophen (PERCOCET/ROXICET) 5-325 MG tablet Place 1 tablet into feeding tube 3 (three) times daily. 07/12/21   Osvaldo Shipper, MD  polyethylene glycol (MIRALAX / GLYCOLAX) 17 g packet Take 17 g by mouth 2 (two) times daily. 07/12/21   Osvaldo Shipper, MD  sennosides (SENOKOT) 8.8 MG/5ML syrup Place 10 mLs into feeding tube 2 (two) times daily. 07/12/21   Osvaldo Shipper, MD  Water For Irrigation, Sterile (FREE WATER) SOLN Place 75 mLs into feeding tube every 4 (four) hours. 07/12/21   Osvaldo Shipper, MD    Allergies Kaopectate  [attapulgite], Thiopental, Gatifloxacin, Ceftin [cefuroxime], and Tomato  Family History  Family history unknown: Yes    Social History Social History   Tobacco Use   Smoking status: Never   Smokeless tobacco: Never  Substance Use Topics   Alcohol use: Not Currently   Drug use: Not Currently    Review  of Systems Unable to assess ____________________________________________   PHYSICAL EXAM:  VITAL SIGNS: ED Triage Vitals  Enc Vitals Group     BP 07/25/21 1839 (!) 123/91     Pulse Rate 07/25/21 1839 72     Resp 07/25/21 1839 16     Temp 07/25/21 1839 97.8 F (36.6 C)     Temp Source 07/25/21 1839 Oral     SpO2 07/25/21 1839 97 %     Weight --      Height --      Head Circumference --      Peak Flow --      Pain Score 07/25/21 1840 8     Pain Loc --      Pain Edu? --      Excl. in GC? --    Constitutional: Alert. Well appearing and in no acute distress. Eyes: Conjunctivae are normal. PERRL. Head: Atraumatic. Nose: No congestion/rhinnorhea. Mouth/Throat: Mucous membranes are moist. Neck: No stridor Cardiovascular: Grossly normal heart sounds.  Good peripheral circulation. Respiratory: Normal respiratory effort.  No retractions. Gastrointestinal:  Soft and nontender. No distention.  Open stoma in the right upper quadrant Musculoskeletal: No obvious deformities Neurologic:  Normal speech and language. No gross focal neurologic deficits are appreciated. Skin: PICC line to right upper extremity.  Skin is warm and dry. No rash noted. Psychiatric: Cooperative  ____________________________________________   LABS (all labs ordered are listed, but only abnormal results are displayed)  Labs Reviewed - No data to display ____________________________________________ RADIOLOGY  ED MD interpretation: Single view x-ray of the abdomen shows gastrostomy catheter repositioned in the duodenum with free flow of contrast into the duodenum and jejunum.  Official radiology report(s): No results found.  ____________________________________________   PROCEDURES  Procedure(s) performed (including Critical Care):  Procedures   ____________________________________________   INITIAL IMPRESSION / ASSESSMENT AND PLAN / ED COURSE  As part of my medical decision making, I reviewed the following data within the electronic medical record, if available:  Nursing notes reviewed and incorporated, Labs reviewed, EKG interpreted, Old chart reviewed, Radiograph reviewed and Notes from prior ED visits reviewed and incorporated      Presentation consistent with G Tube dislodgement, without evidence of infection at ostomy site. ED Interventions: Feeding tube reinsertion  A post-placement x-ray was obtained by injecting Gastrografin into the tube with subsequent film exposure. This x-ray demonstrated excellent tube placement.  Patient's PICC line also removed from the right upper extremity. Disposition: DC home with recommendations for expedited PCP and GI follow up.  SRP discussed.      ____________________________________________   FINAL CLINICAL IMPRESSION(S) / ED DIAGNOSES  Final diagnoses:  Dislodged gastrostomy tube  PIC line (peripherally  inserted central catheter) removal     ED Discharge Orders     None        Note:  This document was prepared using Dragon voice recognition software and may include unintentional dictation errors.    Merwyn Katos, MD 07/26/21 2105

## 2021-08-10 ENCOUNTER — Emergency Department: Payer: Medicare Other

## 2021-08-10 ENCOUNTER — Other Ambulatory Visit: Payer: Self-pay

## 2021-08-10 ENCOUNTER — Emergency Department
Admission: EM | Admit: 2021-08-10 | Discharge: 2021-08-11 | Disposition: A | Discharge status: Home / Self Care | Payer: Medicare Other | Attending: Emergency Medicine | Admitting: Emergency Medicine

## 2021-08-10 DIAGNOSIS — Z23 Encounter for immunization: Secondary | ICD-10-CM | POA: Diagnosis not present

## 2021-08-10 DIAGNOSIS — I110 Hypertensive heart disease with heart failure: Secondary | ICD-10-CM | POA: Insufficient documentation

## 2021-08-10 DIAGNOSIS — W06XXXA Fall from bed, initial encounter: Secondary | ICD-10-CM | POA: Insufficient documentation

## 2021-08-10 DIAGNOSIS — Y9289 Other specified places as the place of occurrence of the external cause: Secondary | ICD-10-CM | POA: Diagnosis not present

## 2021-08-10 DIAGNOSIS — E039 Hypothyroidism, unspecified: Secondary | ICD-10-CM | POA: Insufficient documentation

## 2021-08-10 DIAGNOSIS — S0181XA Laceration without foreign body of other part of head, initial encounter: Secondary | ICD-10-CM | POA: Insufficient documentation

## 2021-08-10 DIAGNOSIS — W19XXXA Unspecified fall, initial encounter: Secondary | ICD-10-CM

## 2021-08-10 DIAGNOSIS — E1129 Type 2 diabetes mellitus with other diabetic kidney complication: Secondary | ICD-10-CM | POA: Insufficient documentation

## 2021-08-10 DIAGNOSIS — S0990XA Unspecified injury of head, initial encounter: Secondary | ICD-10-CM | POA: Diagnosis present

## 2021-08-10 DIAGNOSIS — Z79899 Other long term (current) drug therapy: Secondary | ICD-10-CM | POA: Diagnosis not present

## 2021-08-10 DIAGNOSIS — I5032 Chronic diastolic (congestive) heart failure: Secondary | ICD-10-CM | POA: Insufficient documentation

## 2021-08-10 MED ORDER — TETANUS-DIPHTH-ACELL PERTUSSIS 5-2.5-18.5 LF-MCG/0.5 IM SUSY
0.5000 mL | PREFILLED_SYRINGE | Freq: Once | INTRAMUSCULAR | Status: AC
  Administered 2021-08-10: 0.5 mL via INTRAMUSCULAR
  Filled 2021-08-10: qty 0.5

## 2021-08-10 MED ORDER — LIDOCAINE HCL (PF) 1 % IJ SOLN
5.0000 mL | Freq: Once | INTRAMUSCULAR | Status: AC
  Administered 2021-08-10: 5 mL
  Filled 2021-08-10: qty 5
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
## 2021-08-10 NOTE — ED Provider Notes (Signed)
[facility] Emergency Department Provider Note  ____________________________________________  Time seen: Approximately 11:17 PM  I have reviewed the triage vital signs and the nursing notes.   HISTORY  Chief Complaint Head Laceration (C/o forehead laceration after fall off bed while pt. Was being changed at nursing home. Pt. Is bedbound/immobile. EMS states nursing home staff denied LOC.)  Level 5 caveat:  Portions of the history and physical were unable to be obtained due to nonverbal, TBI   HPI Joanne [name] is a 50 y.o. female with a history of seizure disorder, traumatic brain injury, nonverbal, bedbound, diabetes who presents for evaluation after fall.  Patient was being changed at her nursing home when she fell off the bed.  She hit her forehead onto the floor.  No LOC she is not on any blood thinners.  Patient is currently at baseline.  Patient is nonverbal, does not respond to any questions.   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   Traumatic brain injury [facility])     Patient Active Problem List   Diagnosis Date Noted   HCAP (healthcare-associated pneumonia) 07/04/2021   Aspiration pneumonia (HCC) 07/04/2021   Chronic diastolic CHF (congestive heart failure) (HCC) 07/04/2021   Acute metabolic encephalopathy 07/04/2021   Hyperkalemia 07/04/2021   Type II diabetes  mellitus with renal manifestations (HCC) 07/04/2021   Wound, open, in back and left leg 07/04/2021   Chronic indwelling Foley catheter    Leaking PEG tube (HCC)    Failure to thrive in adult 04/24/2021   Requires assistance with all daily activities 04/24/2021   G tube feedings (HCC) 04/24/2021   Closed injury of head 09/06/2020   Seizure disorder (HCC) 09/06/2020   Vitamin D deficiency 09/06/2020   Mild malnutrition (HCC)    Protein-calorie malnutrition, severe 05/20/2020   Weakness    Full code status    Hypokalemia 05/16/2020   Severe sepsis with septic shock (HCC) 05/15/2020   Sacral decubitus ulcer, stage IV (HCC)    Goals of care, counseling/discussion    Palliative care by specialist    DNR (do not resuscitate) discussion    Septic shock (HCC) 03/02/2020   Hypernatremia 11/15/2019   Thrombocytopenia (HCC) 11/15/2019   AKI (acute kidney injury) (HCC) 11/15/2019   HTN (hypertension) 11/15/2019   Non-insulin dependent type 2 diabetes mellitus (HCC) 11/15/2019   Hypothyroidism 11/15/2019   Severe sepsis with acute organ dysfunction (HCC) 11/15/2019   Left hemiplegia (HCC) 11/15/2019   Pressure injury of skin 11/15/2019   Aspiration pneumonia due to gastric secretions (HCC)    Acute kidney injury (AKI) with acute tubular necrosis (ATN) (HCC)    Traumatic brain injury    Abdominal pain 08/05/2019   Vomiting 08/05/2019   Acute on chronic respiratory failure with hypoxia (HCC) 06/06/2018   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   Pneumonia due to Klebsiella pneumoniae (HCC) 06/06/2018   Acute on  chronic combined systolic and diastolic CHF (congestive heart failure) (HCC) 06/06/2018   Sepsis secondary to UTI (HCC) 06/06/2018   ARDS (adult respiratory distress syndrome) (HCC) 06/06/2018   Acute on chronic respiratory failure with hypoxia (HCC) 06/06/2018   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   Iron deficiency anemia 05/20/2018   Gross hematuria 01/18/2018   Urinary retention  01/18/2018   Acute respiratory distress 10/23/2012   Cardiorespiratory arrest (HCC) 10/23/2012   Acute blood loss anemia 10/20/2012   Hypophosphatemia 10/20/2012   Leukopenia 10/20/2012   Altered mental status 10/19/2012   Duodenal obstruction 10/19/2012    Past Surgical History:  Procedure Laterality Date   IR REPLC GASTRO/COLONIC TUBE PERCUT W/FLUORO  07/11/2021   PEG PLACEMENT N/A 05/27/2020   Procedure: PERCUTANEOUS ENDOSCOPIC GASTROSTOMY (PEG) PLACEMENT;  Surgeon: Midge Minium, MD;  Location: [facility] ENDOSCOPY;  Service: Endoscopy;  Laterality: N/A;   TRACHEOSTOMY      Prior to Admission medications   Medication Sig Start Date End Date Taking? Authorizing Provider  acetaminophen (TYLENOL) 325 MG tablet Take 650 mg by mouth every 8 (eight) hours as needed for mild pain or moderate pain.    [provider]  albuterol (PROVENTIL) (2.5 MG/3ML) 0.083% nebulizer solution Take 3 mLs (2.5 mg total) by nebulization every 4 (four) hours as needed for wheezing or shortness of breath. 07/12/21   Osvaldo Shipper, MD  baclofen (LIORESAL) 10 MG tablet Take 10 mg by mouth 3 (three) times daily. 07/19/20   [provider]  bisacodyl (DULCOLAX) 5 MG EC tablet Take 2 tablets (10 mg total) by mouth at bedtime as needed for moderate constipation. 04/30/21   Gillis Santa, MD  carbamazepine (TEGRETOL XR) 100 MG 12 hr tablet Take 50 mg by mouth 4 (four) times daily.    [provider]  docusate (COLACE) 50 MG/5ML liquid Place 10 mLs (100 mg total) into feeding tube 2 (two) times daily. 07/12/21   Osvaldo Shipper, MD  ferrous sulfate 325 (65 FE) MG tablet Take 325 mg by mouth daily with breakfast.    [provider]  gabapentin (NEURONTIN) 100 MG capsule Take 100 mg by mouth 2 (two) times daily.    [provider]  levothyroxine (SYNTHROID) 112 MCG tablet Take 1 tablet (112 mcg total) by mouth daily before breakfast. 07/12/21   Osvaldo Shipper, MD  midodrine (PROAMATINE) 10  MG tablet Place 1 tablet (10 mg total) into feeding tube 3 (three) times daily as needed (if SBP < 100). 04/30/21   Gillis Santa, MD  Nutritional Supplements (FEEDING SUPPLEMENT, OSMOLITE 1.2 CAL,) LIQD Place 1,000 mLs into feeding tube continuous. 07/12/21   Osvaldo Shipper, MD  oxyCODONE-acetaminophen (PERCOCET/ROXICET) 5-325 MG tablet Place 1 tablet into feeding tube 3 (three) times daily. 07/12/21   Osvaldo Shipper, MD  polyethylene glycol (MIRALAX / GLYCOLAX) 17 g packet Take 17 g by mouth 2 (two) times daily. 07/12/21   Osvaldo Shipper, MD  sennosides (SENOKOT) 8.8 MG/5ML syrup Place 10 mLs into feeding tube 2 (two) times daily. 07/12/21   Osvaldo Shipper, MD  Water For Irrigation, Sterile (FREE WATER) SOLN Place 75 mLs into feeding tube every 4 (four) hours. 07/12/21   Osvaldo Shipper, MD    Allergies Kaopectate  [attapulgite], Thiopental, Gatifloxacin, Ceftin [cefuroxime], and Tomato  Family History  Family history unknown: Yes    Social History Social History   Tobacco Use   Smoking status: Never   Smokeless tobacco: Never  Substance Use Topics   Alcohol use: Not Currently   Drug  use: Not Currently    Review of Systems  Constitutional: Negative for fever. Respiratory: Negative for difficult breathing or cough Gastrointestinal: Negative for vomiting or diarrhea Skin: + forehead laceration Neurological: + head injury.   ____________________________________________   PHYSICAL EXAM:  VITAL SIGNS: ED Triage Vitals  Enc Vitals Group     BP 08/10/21 2121 109/88     Pulse Rate 08/10/21 2121 79     Resp 08/10/21 2121 (!) 23     Temp 08/10/21 2121 97.8 F (36.6 C)     Temp Source 08/10/21 2121 Oral     SpO2 08/10/21 2130 100 %     Weight 08/10/21 2122 147 lb 11.3 oz (67 kg)     Height 08/10/21 2122 5\' 5"  (1.651 m)     Head Circumference --      Peak Flow --      Pain Score 08/10/21 2122 5     Pain Loc --      Pain Edu? --      Excl. in GC? --     Full spinal  precautions maintained throughout the trauma exam. Constitutional: Alert, non verbal. No acute distress. Does not appear intoxicated. HEENT Head: Normocephalic. Forehead hematoma with a 3cm lacetaion Face: No facial bony tenderness. Stable midface Ears: No hemotympanum bilaterally. No Battle sign Eyes: No eye injury. PERRL. No raccoon eyes Nose: Nontender. No epistaxis. No rhinorrhea Mouth/Throat: Mucous membranes are moist. No oropharyngeal blood. No dental injury. Airway patent without stridor. Normal voice. Neck: no C-collar. No midline c-spine tenderness.  Cardiovascular: Normal rate, regular rhythm. Normal and symmetric distal pulses are present in all extremities. Pulmonary/Chest: Chest wall is stable and nontender to palpation/compression. Normal respiratory effort. Breath sounds are normal. No crepitus.  Abdominal: Soft, nontender, non distended. Musculoskeletal: Nontender with normal full range of motion in all extremities. No deformities. No thoracic or lumbar midline spinal tenderness. Pelvis is stable. Skin: Skin is warm, dry and intact. No abrasions or contutions. Psychiatric: Speech and behavior are appropriate. Neurological: Moves extremities on her own. Non verbal   ____________________________________________   LABS (all labs ordered are listed, but only abnormal results are displayed)  Labs Reviewed - No data to display ____________________________________________  EKG  none  ____________________________________________  RADIOLOGY  I have personally reviewed the images performed during this visit and I agree with the Radiologist's read.   Interpretation by Radiologist:  CT HEAD WO CONTRAST (2123)  Result Date: 08/11/2021 CLINICAL DATA:  Forehead laceration EXAM: CT HEAD WITHOUT CONTRAST CT CERVICAL SPINE WITHOUT CONTRAST TECHNIQUE: Multidetector CT imaging of the head and cervical spine was performed following the standard protocol without intravenous  contrast. Multiplanar CT image reconstructions of the cervical spine were also generated. COMPARISON:  CT brain 07/04/2021 FINDINGS: CT HEAD FINDINGS Brain: No acute territorial infarction, hemorrhage or intracranial mass. Atrophy. Stable ventricle size and morphology with ex vacuo dilatation of the right lateral ventricle. Encephalomalacia within the right parietal and occipital lobes as well as the left temporal lobe. Mild asymmetric atrophy of the right brainstem. Vascular: No hyperdense vessels.  No unexpected calcification Skull: No fracture.  Prior left craniotomy Sinuses/Orbits: No acute finding. Other: Small left forehead scalp hematoma and laceration CT CERVICAL SPINE FINDINGS Alignment: Kyphosis of the cervical spine. No subluxation. Facet alignment within normal limits. Skull base and vertebrae: No acute fracture. No primary bone lesion or focal pathologic process. Soft tissues and spinal canal: No prevertebral fluid or swelling. No visible canal hematoma. Disc levels: Moderate to marked diffuse  disc space narrowing. Partial ankylosis throughout the cervical spine. Upper chest: Negative. Other: None IMPRESSION: 1. Limited by habitus and positioning. 2. No definite CT evidence for acute intracranial abnormality. Previous left craniotomy. Atrophy and areas of encephalomalacia within the left temporal and right parietooccipital lobes 3. Kyphosis of the cervical spine with degenerative changes. No acute osseous abnormality Electronically Signed   By: Jasmine Pang M.D.   On: 08/11/2021 00:04   CT Cervical Spine Wo Contrast  Result Date: 08/11/2021 CLINICAL DATA:  Forehead laceration EXAM: CT HEAD WITHOUT CONTRAST CT CERVICAL SPINE WITHOUT CONTRAST TECHNIQUE: Multidetector CT imaging of the head and cervical spine was performed following the standard protocol without intravenous contrast. Multiplanar CT image reconstructions of the cervical spine were also generated. COMPARISON:  CT brain 07/04/2021  FINDINGS: CT HEAD FINDINGS Brain: No acute territorial infarction, hemorrhage or intracranial mass. Atrophy. Stable ventricle size and morphology with ex vacuo dilatation of the right lateral ventricle. Encephalomalacia within the right parietal and occipital lobes as well as the left temporal lobe. Mild asymmetric atrophy of the right brainstem. Vascular: No hyperdense vessels.  No unexpected calcification Skull: No fracture.  Prior left craniotomy Sinuses/Orbits: No acute finding. Other: Small left forehead scalp hematoma and laceration CT CERVICAL SPINE FINDINGS Alignment: Kyphosis of the cervical spine. No subluxation. Facet alignment within normal limits. Skull base and vertebrae: No acute fracture. No primary bone lesion or focal pathologic process. Soft tissues and spinal canal: No prevertebral fluid or swelling. No visible canal hematoma. Disc levels: Moderate to marked diffuse disc space narrowing. Partial ankylosis throughout the cervical spine. Upper chest: Negative. Other: None IMPRESSION: 1. Limited by habitus and positioning. 2. No definite CT evidence for acute intracranial abnormality. Previous left craniotomy. Atrophy and areas of encephalomalacia within the left temporal and right parietooccipital lobes 3. Kyphosis of the cervical spine with degenerative changes. No acute osseous abnormality Electronically Signed   By: Jasmine Pang M.D.   On: 08/11/2021 00:04     ____________________________________________   PROCEDURES  Procedure(s) performed:yes  .Marland KitchenLaceration Repair  Date/Time: 08/11/2021 12:39 AM Performed by: Nita Sickle, MD Authorized by: Nita Sickle, MD   Consent:    Consent obtained:  Verbal   Consent given by:  Patient   Risks discussed:  Infection, pain, retained foreign body, poor cosmetic result and poor wound healing Anesthesia:    Anesthesia method:  None Laceration details:    Location:  Face   Face location:  Forehead   Length (cm):   3 Pre-procedure details:    Preparation:  Imaging obtained to evaluate for foreign bodies and patient was prepped and draped in usual sterile fashion Exploration:    Hemostasis achieved with:  Direct pressure   Imaging outcome: foreign body not noted     Wound exploration: entire depth of wound visualized     Contaminated: no   Treatment:    Area cleansed with:  Saline   Amount of cleaning:  Extensive   Irrigation solution:  Sterile saline   Visualized foreign bodies/material removed: no   Skin repair:    Repair method:  Staples   Number of staples:  2 Approximation:    Approximation:  Close Repair type:    Repair type:  Simple Post-procedure details:    Dressing:  Sterile dressing   Procedure completion:  Tolerated well, no immediate complications Critical Care performed:  None ____________________________________________   INITIAL IMPRESSION / ASSESSMENT AND PLAN / ED COURSE  50 y.o. female with a history of seizure disorder,  traumatic brain injury, nonverbal, bedbound, diabetes who presents for evaluation after fall.  Patient fell off the bed while being changed in the nursing home.  Has a forehead laceration which was repaired per procedure note above.  Unknown last tetanus shot therefore booster was given.  CT head and cervical spine showed no acute traumatic injuries.  No other injuries based on exam.  Patient is stable for discharge back to her nursing home       ____________________________________________  Please note:  Patient was evaluated in Emergency Department today for the symptoms described in the history of present illness. Patient was evaluated in the context of the global COVID-19 pandemic, which necessitated consideration that the patient might be at risk for infection with the SARS-CoV-2 virus that causes COVID-19. Institutional protocols and algorithms that pertain to the evaluation of patients at risk for COVID-19 are in a state of rapid change based on  information released by regulatory bodies including the CDC and federal and state organizations. These policies and algorithms were followed during the patient's care in the ED.  Some ED evaluations and interventions may be delayed as a result of limited staffing during the pandemic.   ____________________________________________   FINAL CLINICAL IMPRESSION(S) / ED DIAGNOSES   Final diagnoses:  Fall, initial encounter  Laceration of forehead, initial encounter      NEW MEDICATIONS STARTED DURING THIS VISIT:  ED Discharge Orders     None        Note:  This document was prepared using Dragon voice recognition software and may include unintentional dictation errors.    Nita Sickle, MD 08/11/21 [phone]

## 2021-08-11 DIAGNOSIS — S0181XA Laceration without foreign body of other part of head, initial encounter: Secondary | ICD-10-CM | POA: Diagnosis not present

## 2021-08-11 NOTE — Discharge Instructions (Addendum)
Keep laceration dry and clean. Wash with warm water and soap. Apply topical bacitracin. Protect from the sun to minimize scarring. Cover it with SPF 70 or higher and use hat when out in the sun for 6-9 months. You received 2 staples that must be removed in 7 days.  Watch for signs of infection: pus, redness of the skin surrounding it, or fever. If these develop see your doctor or return to the ER for antibiotics.

## 2022-06-29 IMAGING — DX DG ABDOMEN 1V
1 series · 1 of 1 positions shown · non-contrast
Comparison: None.

CLINICAL DATA: Orogastric tube placement.

EXAM:
ABDOMEN - 1 VIEW

[abdomen supine]
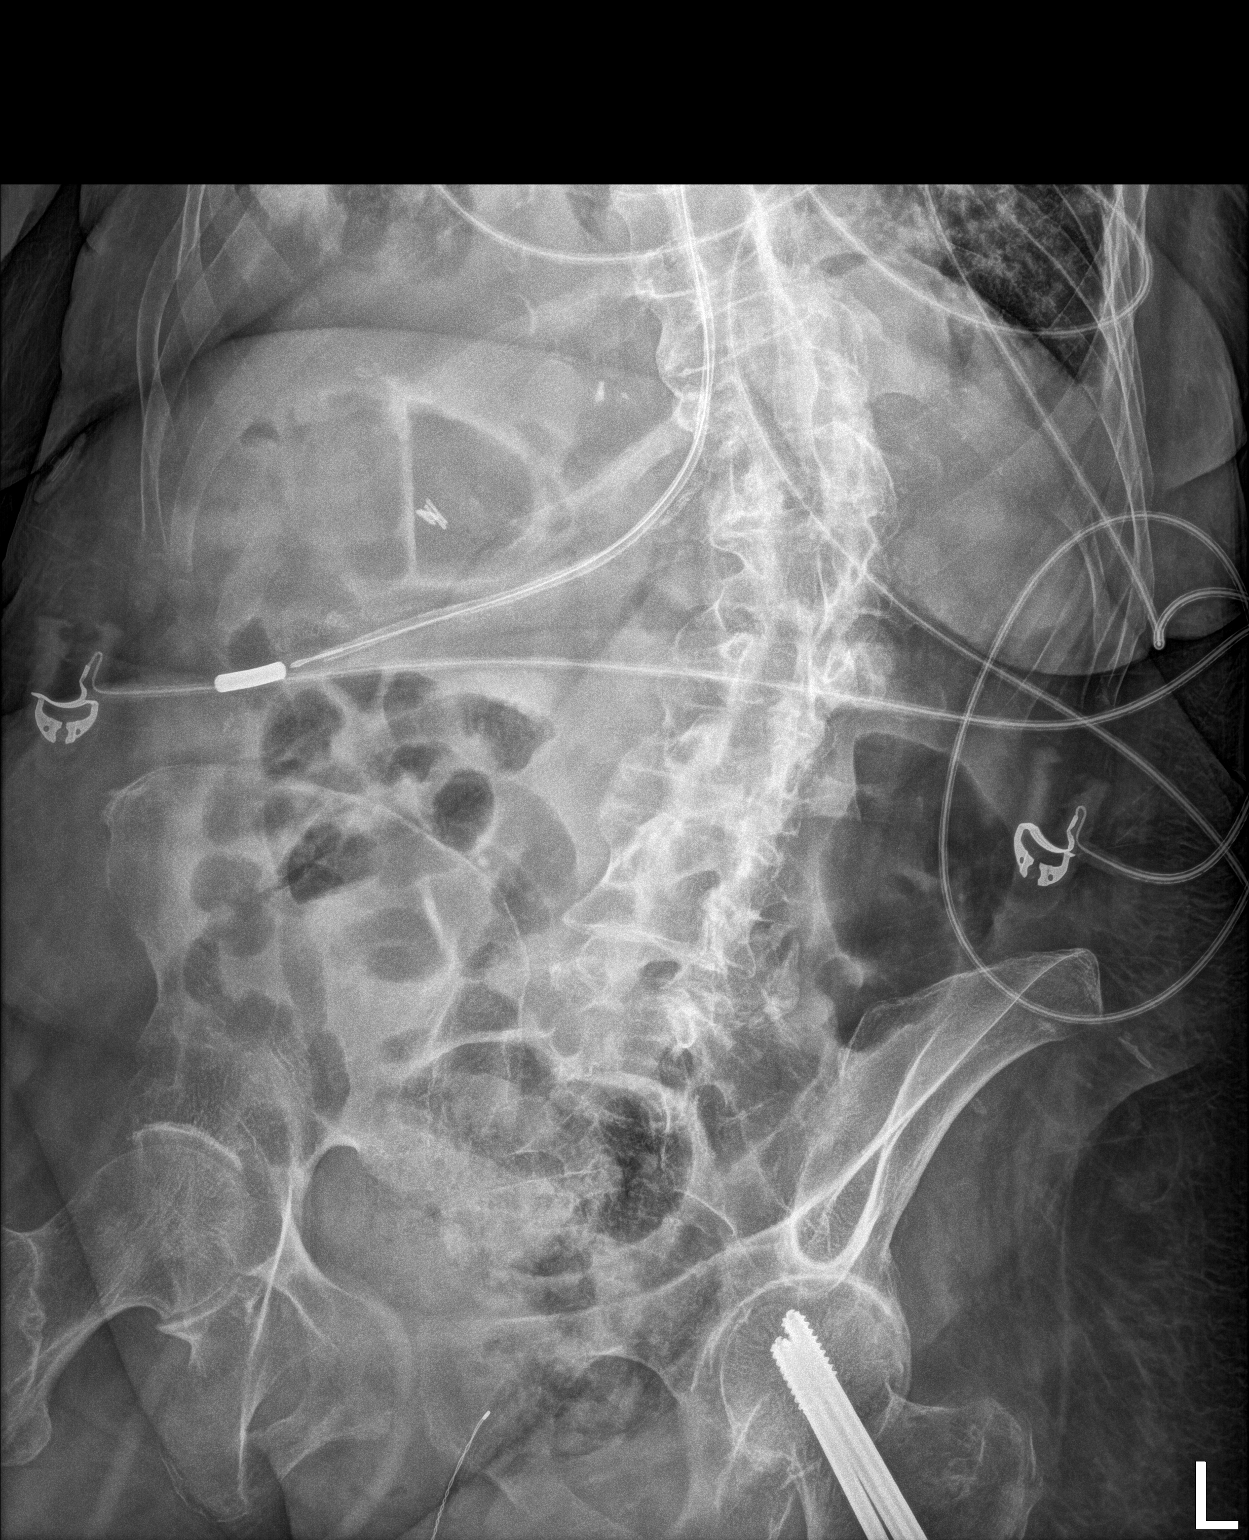

[1 of 1 positions shown; findings below may reference images not displayed]

FINDINGS: A nasogastric tube is seen with its weighted tip overlying the
lateral aspect of the right upper quadrant. Given patient
positioning this is likely within the region of the gastric antrum
versus proximal duodenum. The bowel gas pattern is normal. No
radio-opaque calculi or other significant radiographic abnormality
are seen. Radiopaque surgical clips are seen within the right upper
quadrant. There is marked severity levoscoliosis of the lumbar
spine. Radiopaque fixation screws are seen within the proximal left
femur.
IMPRESSION: Nasogastric tube positioning as described above.

## 2022-06-29 IMAGING — DX DG CHEST 1V PORT
1 series · 1 of 1 positions shown · non-contrast
Comparison: 05/20/2020.

CLINICAL DATA: Tracheostomy.

EXAM:
PORTABLE CHEST 1 VIEW

[chest ap]
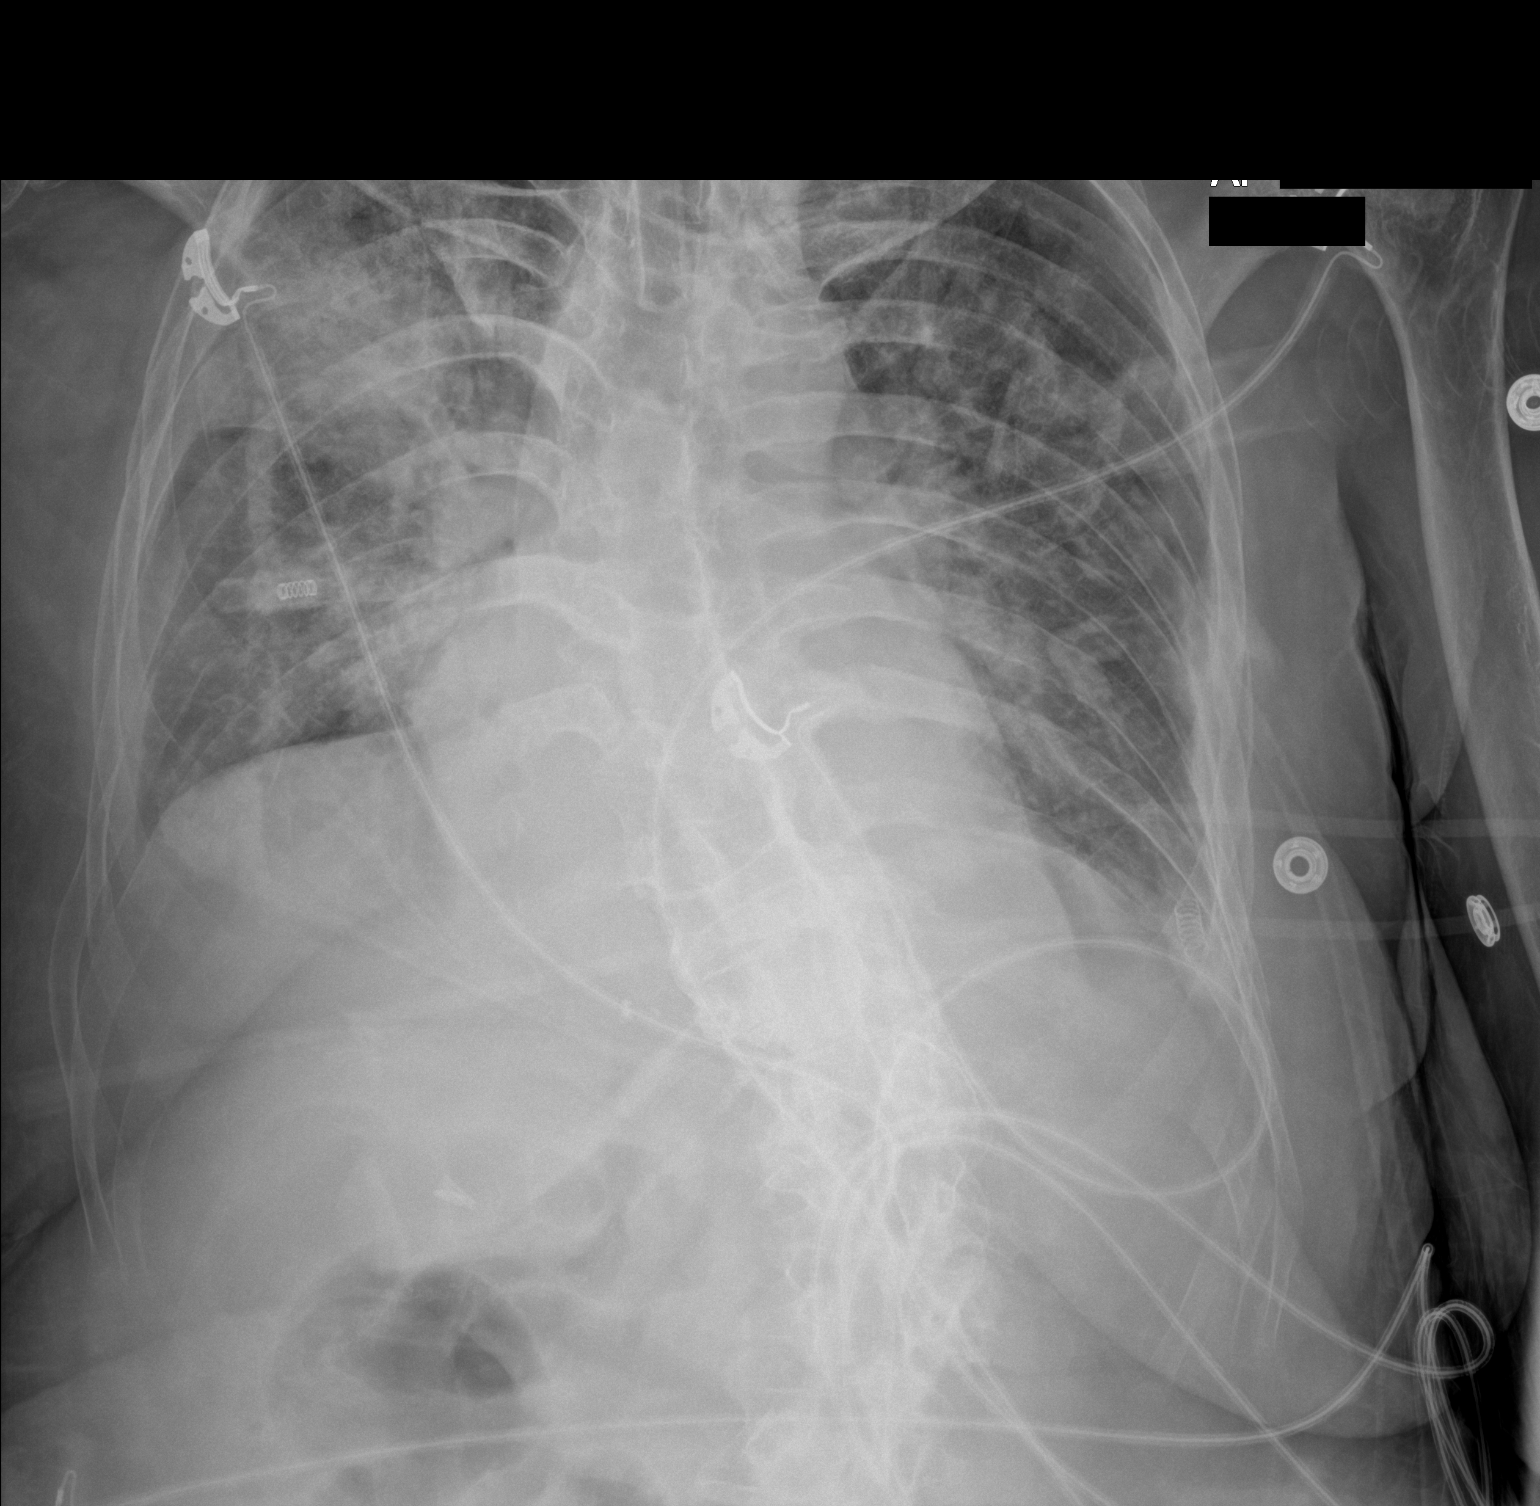

[1 of 1 positions shown; findings below may reference images not displayed]

FINDINGS: Tracheostomy noted in good anatomic position. Heart size stable.
Bilateral pulmonary infiltrates/edema again noted with interim
improvement in aeration. Interim improvement in bilateral pleural
effusions. Prominent thoracic spine scratched it prominent
thoracolumbar spine scoliosis and degenerative change. Surgical
clips right upper quadrant.
IMPRESSION: 1.  Tracheostomy tube noted good anatomic position.

2. Prominent bilateral pulmonary infiltrates/edema again noted with
interim improvement in aeration. Interim improvement of bilateral
pleural effusions.

## 2022-07-07 IMAGING — DX DG CHEST 1V PORT
1 series · 1 of 1 positions shown · non-contrast
Comparison: 05/23/2020.

CLINICAL DATA: Acute respiratory failure

EXAM:
PORTABLE CHEST 1 VIEW

[chest ap]
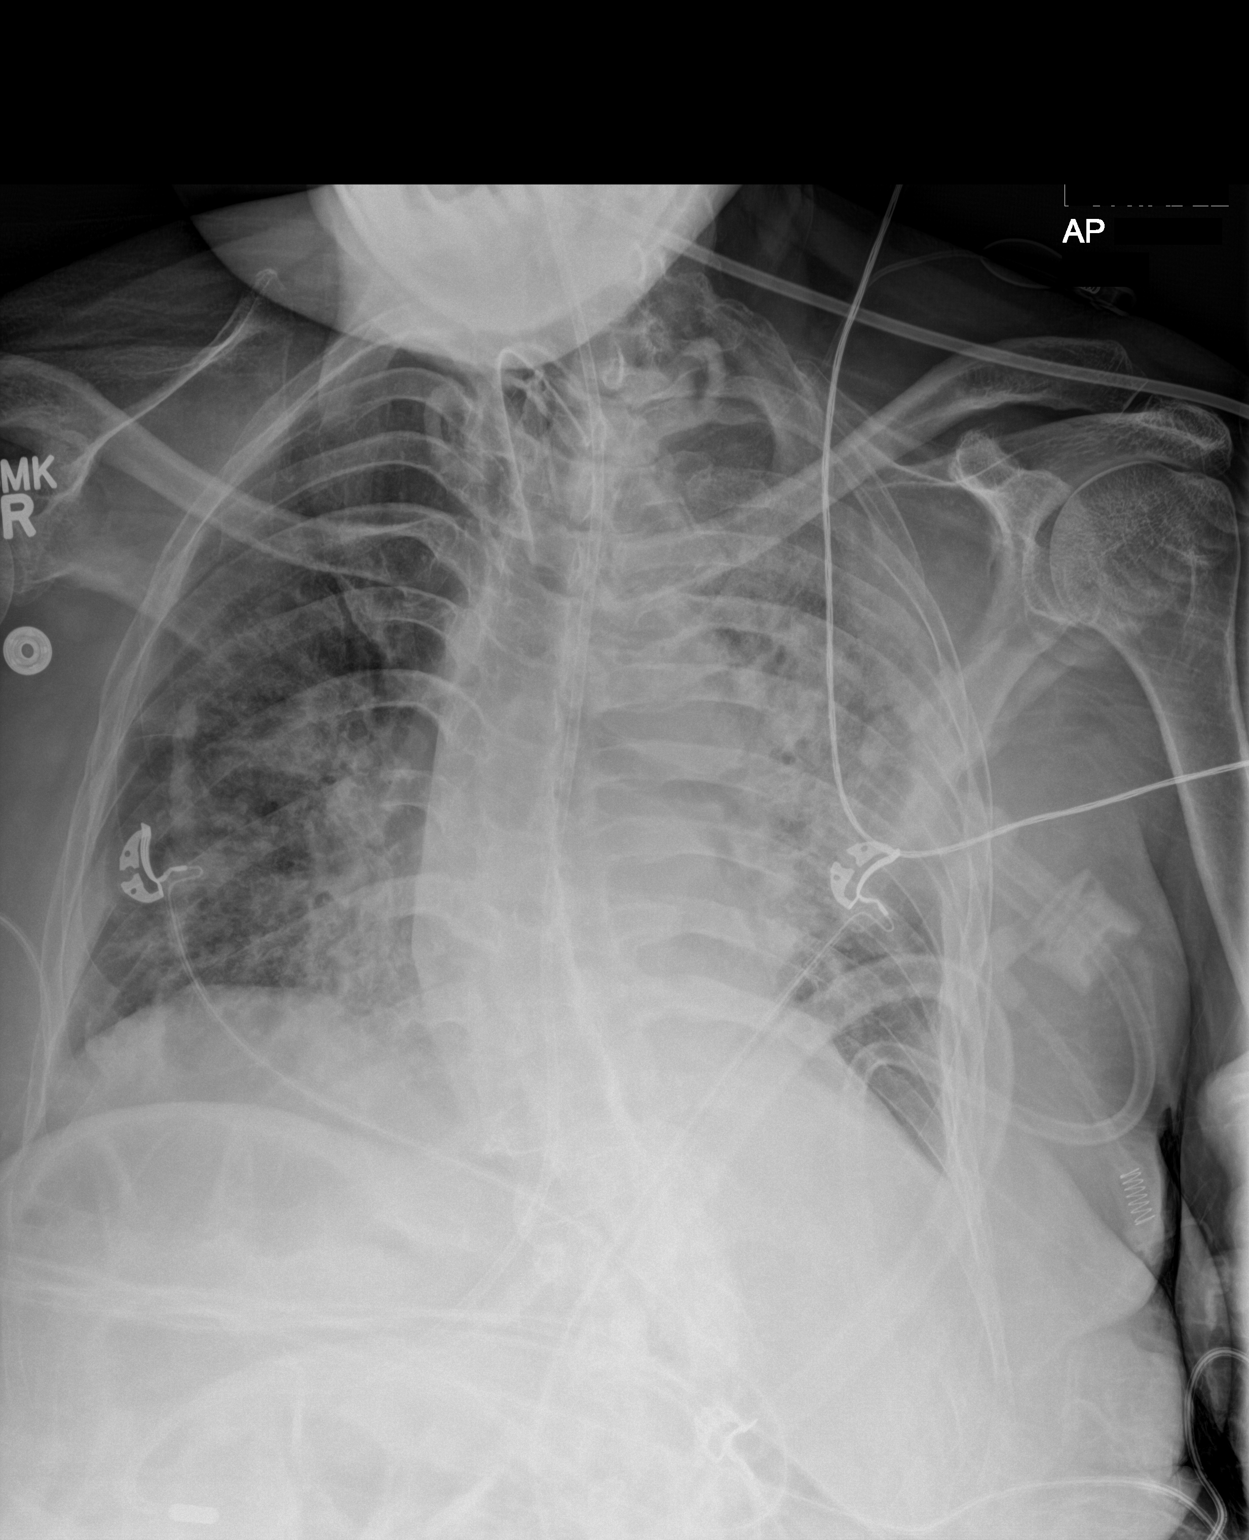

[1 of 1 positions shown; findings below may reference images not displayed]

FINDINGS: Tracheostomy tube feeding tube in stable position. Heart size
stable. Diffuse bilateral prominent pulmonary infiltrates/edema
again noted. Progression of infiltrate in the left upper lung noted.
Interim improvement in left base infiltrate. No pleural effusion or
pneumothorax. Thoracic spine scoliosis.
IMPRESSION: 1.  Tracheostomy tube feeding tube in stable position.

2. Diffuse bilateral prominent pulmonary infiltrates/edema again
noted. Progression of infiltrate left upper lung noted. Interim
improvement in left base infiltrate.

## 2022-07-18 IMAGING — DX DG CHEST 1V PORT
2 series · 2 of 2 positions shown · non-contrast
Comparison: 06/07/2020

CLINICAL DATA: Acute respiratory failure

EXAM:
PORTABLE CHEST 1 VIEW

[chest ap (1 of 2)]
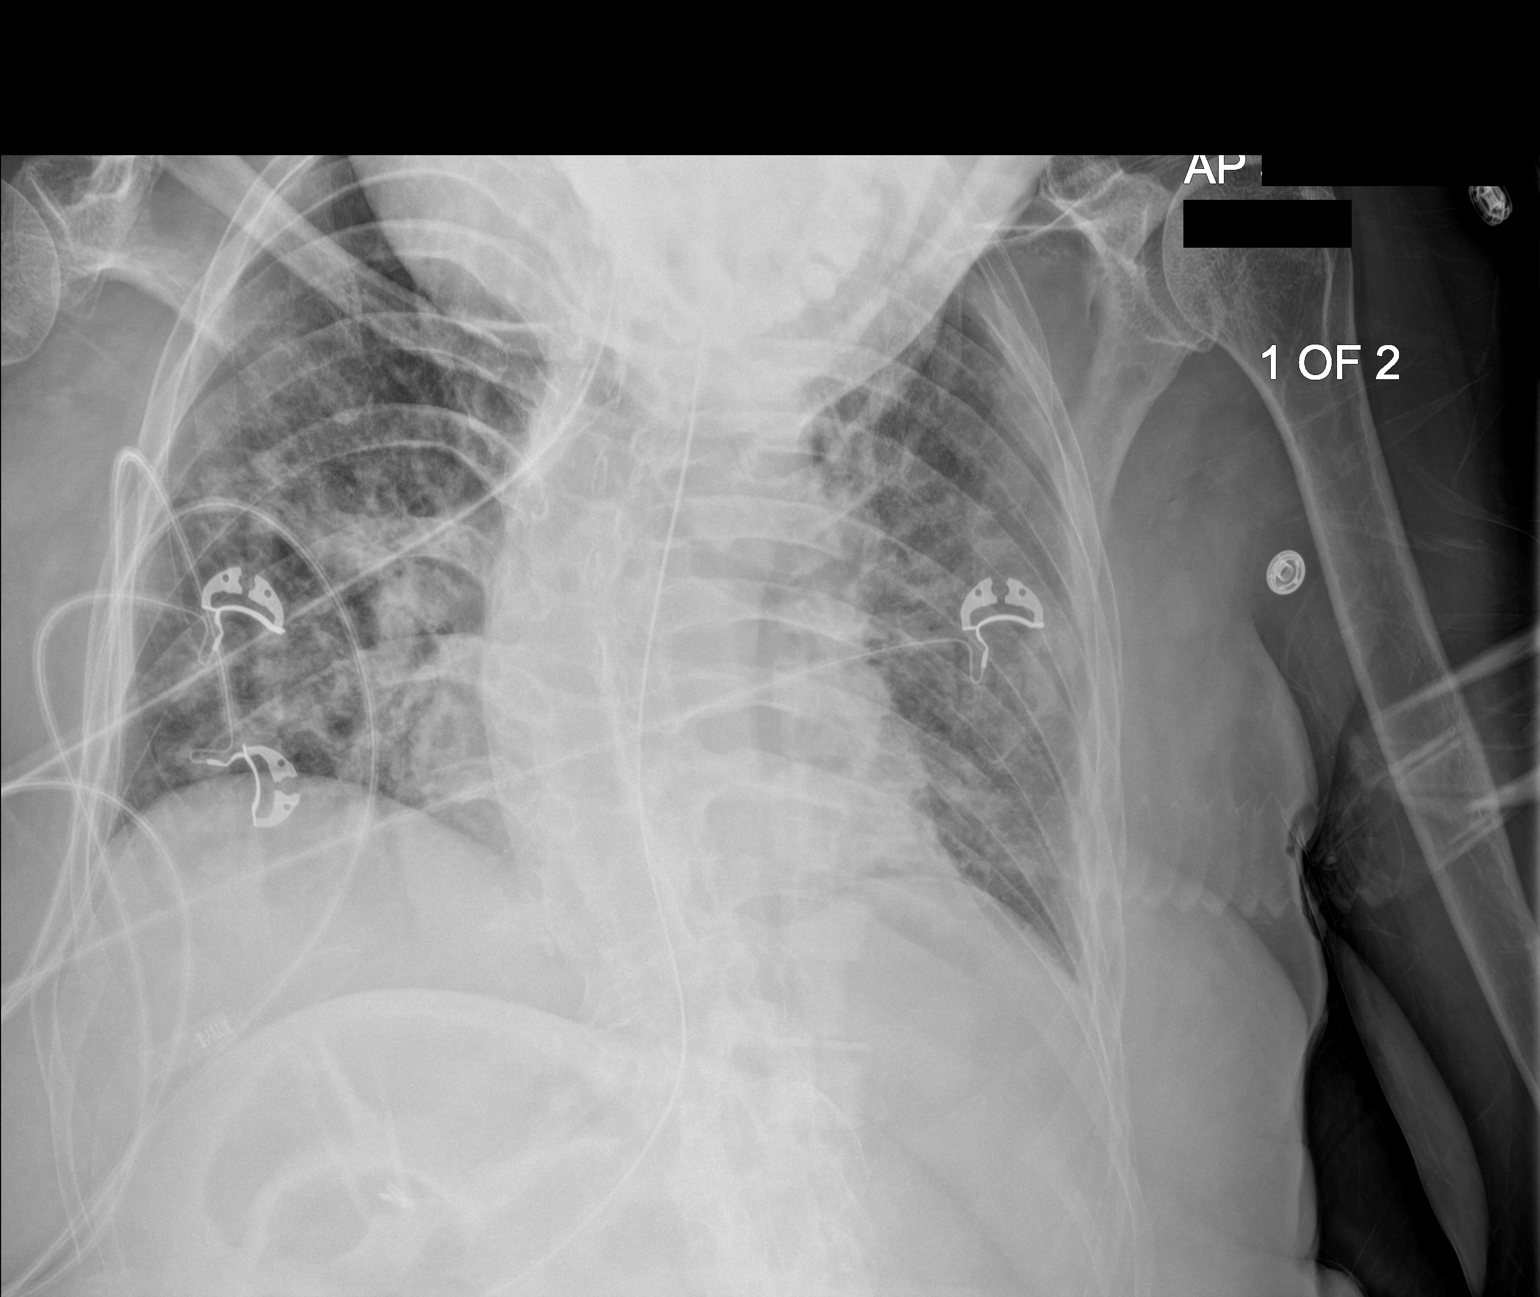

[chest ap (2 of 2)]
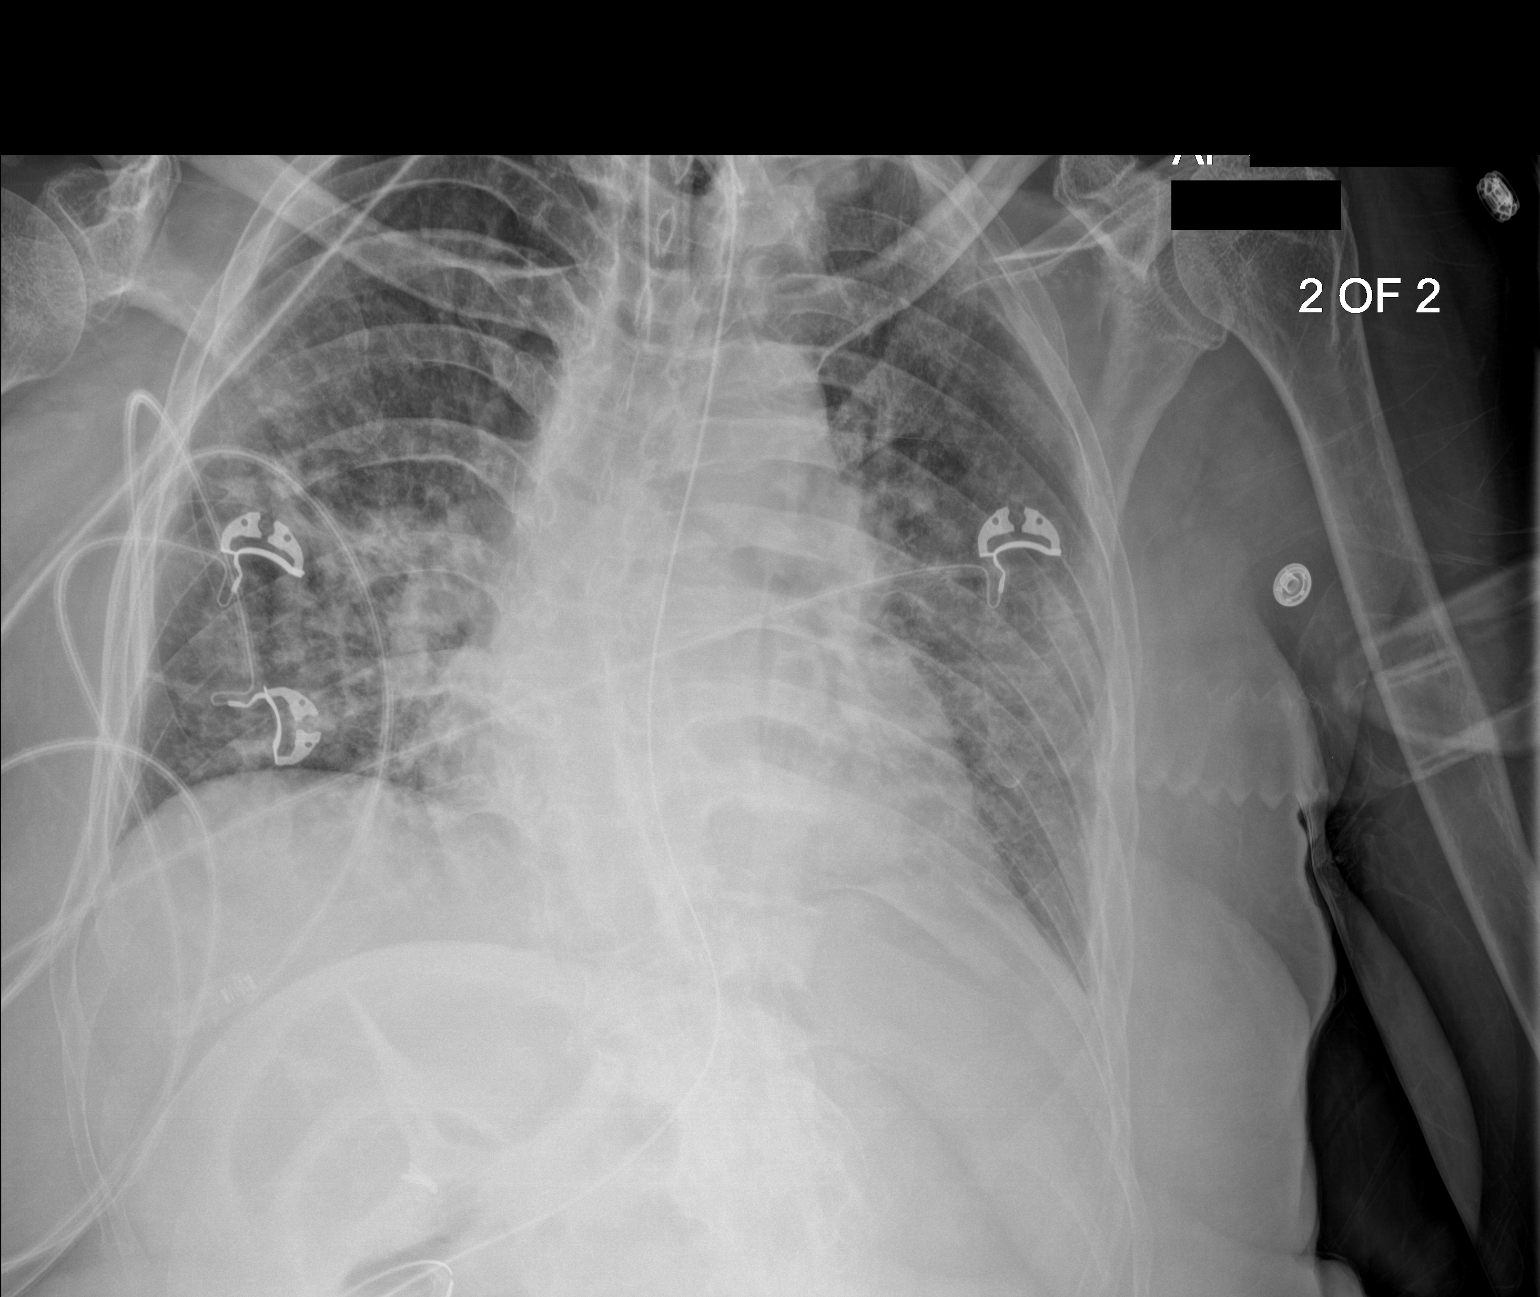

[2 of 2 positions shown; findings below may reference images not displayed]

FINDINGS: Tracheostomy tube remains in the mid to lower trachea, tip
positioned 3 cm from the carina. A transesophageal tube tip seen
coiling in the right upper quadrant. Cholecystectomy clips in the
right upper quadrant as well. Telemetry leads overlie the chest.

Increasingly coalescent right perihilar and infrahilar opacity with
additional patchy peripheral mixed interstitial and consolidative
opacities in the left lung as well. No visible pneumothorax or
effusion though portions of the apices excluded by the patient's
chin. Stable cardiomediastinal contours accounting for differences
in technique and patient rotation. No acute osseous or soft tissue
abnormality. Chronic scoliotic curvature. Degenerative changes are
present in the imaged spine and shoulders.
IMPRESSION: Increasingly coalescent right perihilar and infrahilar opacity with
additional patchy peripheral mixed interstitial and consolidative
opacities in the left lung as well. Findings could reflect worsening
multifocal pneumonia.

Lines and tubes as above.

## 2022-08-23 ENCOUNTER — Other Ambulatory Visit: Payer: Self-pay

## 2022-08-23 DIAGNOSIS — K9423 Gastrostomy malfunction: Secondary | ICD-10-CM

## 2022-08-28 ENCOUNTER — Encounter: Payer: Self-pay | Admitting: Gastroenterology

## 2022-08-29 ENCOUNTER — Ambulatory Visit: Payer: Medicare Other | Admitting: Certified Registered"

## 2022-08-29 ENCOUNTER — Encounter
Admission: RE | Disposition: A | Discharge status: Skilled Nursing Facility | Payer: Self-pay | Source: Home / Self Care | Attending: Gastroenterology

## 2022-08-29 ENCOUNTER — Encounter: Payer: Self-pay | Admitting: Gastroenterology

## 2022-08-29 ENCOUNTER — Ambulatory Visit
Admission: RE | Admit: 2022-08-29 | Discharge: 2022-08-29 | Disposition: A | Discharge status: Skilled Nursing Facility | Payer: Medicare Other | Attending: Gastroenterology | Admitting: Gastroenterology

## 2022-08-29 DIAGNOSIS — Z4659 Encounter for fitting and adjustment of other gastrointestinal appliance and device: Secondary | ICD-10-CM | POA: Insufficient documentation

## 2022-08-29 DIAGNOSIS — K9423 Gastrostomy malfunction: Secondary | ICD-10-CM

## 2022-08-29 HISTORY — PX: PERCUTANEOUS ENDOSCOPIC GASTROSTOMY (PEG) REMOVAL: SHX6018

## 2022-08-29 LAB — GLUCOSE, CAPILLARY: Glucose-Capillary: 85 mg/dL (ref 70–99)

## 2022-08-29 SURGERY — REMOVAL, PEG TUBE, ENDOSCOPIC

## 2022-08-29 MED ORDER — SODIUM CHLORIDE 0.9 % IV SOLN: INTRAVENOUS | Status: DC

## 2022-08-29 MED ORDER — PROPOFOL 10 MG/ML IV BOLUS
INTRAVENOUS | Status: AC
  Filled 2022-08-29: qty 20

## 2022-08-29 NOTE — Anesthesia Preprocedure Evaluation (Signed)
Anesthesia Evaluation  Patient identified by MRN, date of birth, ID band Patient awake    Reviewed: Allergy & Precautions, H&P , NPO status , Patient's Chart, lab work & pertinent test results, reviewed documented beta blocker date and time   Airway Mallampati: II   Neck ROM: full    Dental  (+) Poor Dentition   Pulmonary pneumonia   Pulmonary exam normal        Cardiovascular Exercise Tolerance: Poor hypertension, +CHF  Normal cardiovascular exam Rhythm:regular Rate:Normal     Neuro/Psych Seizures -,   negative psych ROS   GI/Hepatic negative GI ROS, Neg liver ROS,,,  Endo/Other  diabetesHypothyroidism    Renal/GU Renal disease  negative genitourinary   Musculoskeletal   Abdominal   Peds  Hematology  (+) Blood dyscrasia, anemia   Anesthesia Other Findings Past Medical History: No date: Acute kidney injury (AKI) with acute tubular necrosis (ATN)  ([place]) 06/06/2018: Acute on chronic combined systolic and diastolic CHF  (congestive heart failure) ([place]) 06/06/2018: Acute on chronic respiratory failure with hypoxia (HCC) 06/06/2018: ARDS (adult respiratory distress syndrome) (HCC) No date: Aspiration pneumonia due to gastric secretions (HCC) No date: Diabetes mellitus (HCC) No date: History of traumatic brain injury No date: Hypothyroidism 06/06/2018: Pneumonia due to Klebsiella pneumoniae (HCC) No date: Seizure disorder ([place]) 06/06/2018: Severe sepsis ([place]) 06/06/2018: Tracheostomy status ([place]) No date: Traumatic brain injury [facility]) Past Surgical History: 07/11/2021: IR REPLC GASTRO/COLONIC TUBE PERCUT W/FLUORO 05/27/2020: PEG PLACEMENT; N/A     Comment:  Procedure: PERCUTANEOUS ENDOSCOPIC GASTROSTOMY (PEG)               PLACEMENT;  Surgeon: Lucilla Lame, MD;  Location: [facility]               ENDOSCOPY;  Service: Endoscopy;  Laterality: N/A; No date: TRACHEOSTOMY BMI    Body Mass Index: 24.21 kg/m      Reproductive/Obstetrics negative OB ROS                             Anesthesia Physical Anesthesia Plan  ASA: 3  Anesthesia Plan: General   Post-op Pain Management:    Induction:   PONV Risk Score and Plan:   Airway Management Planned:   Additional Equipment:   Intra-op Plan:   Post-operative Plan:   Informed Consent: I have reviewed the patients History and Physical, chart, labs and discussed the procedure including the risks, benefits and alternatives for the proposed anesthesia with the patient or authorized representative who has indicated his/her understanding and acceptance.     Dental Advisory Given  Plan Discussed with: CRNA  Anesthesia Plan Comments:        Anesthesia Quick Evaluation

## 2022-08-29 NOTE — Procedures (Signed)
The patient was brought into the endoscopy unit and in the PEG tube was inspected.  The PEG tube had a balloon port that was accessed and 10 cc of fluid was removed.  The PEG tube was then removed after the balloon was deflated and the area was cleaned and draped.

## 2022-08-29 NOTE — Progress Notes (Signed)
Patient here from [facility] care by transport. Tried x1 in right antecubital for IV access, unsuccessful. Anesthesia aware.

## 2022-08-29 NOTE — H&P (Signed)
Lucilla Lame, MD [facility] [address] [place], [place] [address] Phone:[phone] Fax : [phone]  Primary Care Physician:  Nona Dell, Corene Cornea, MD Primary Gastroenterologist:  Dr. Allen Norris  Pre-Procedure History & Physical: HPI:  Joanne [name] is a 51 y.o. female is here for an PEG removal.   Past Medical History:  Diagnosis Date   Acute kidney injury (AKI) with acute tubular necrosis (ATN) ([place])    Acute on chronic combined systolic and diastolic CHF (congestive heart failure) ([place]) 06/06/2018   Acute on chronic respiratory failure with hypoxia (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 Authorizing Provider  carbamazepine (TEGRETOL XR) 100 MG 12 hr tablet Take 50 mg by mouth 4 (four) times daily.   Yes [provider]  ferrous sulfate 325 (65 FE) MG tablet Take 325 mg by mouth daily with breakfast.   Yes [provider]  gabapentin (NEURONTIN) 100 MG capsule Take 100 mg by mouth 2 (two) times daily.   Yes [provider]  midodrine (PROAMATINE) 10 MG tablet Place 1 tablet (10 mg total) into feeding tube 3 (three) times daily as needed (if SBP < 100). 04/30/21  Yes Val Riles, MD  acetaminophen  (TYLENOL) 325 MG tablet Take 650 mg by mouth every 8 (eight) hours as needed for mild pain or moderate pain.    [provider]  albuterol (PROVENTIL) (2.5 MG/3ML) 0.083% nebulizer solution Take 3 mLs (2.5 mg total) by nebulization every 4 (four) hours as needed for wheezing or shortness of breath. 07/12/21   Bonnielee Haff, MD  baclofen (LIORESAL) 10 MG tablet Take 10 mg by mouth 3 (three) times daily. 07/19/20   [provider]  bisacodyl (DULCOLAX) 5 MG EC tablet Take 2 tablets (10 mg total) by mouth at bedtime as needed for moderate constipation. 04/30/21   Val Riles, MD  docusate (COLACE) 50 MG/5ML liquid Place 10 mLs (100 mg total) into feeding tube 2 (two) times daily. 07/12/21   Bonnielee Haff, MD  levothyroxine (SYNTHROID) 112 MCG tablet Take 1 tablet (112 mcg total) by mouth daily before breakfast. 07/12/21   Bonnielee Haff, MD  Nutritional Supplements (FEEDING SUPPLEMENT, OSMOLITE 1.2 CAL,) LIQD Place 1,000 mLs into feeding tube continuous. 07/12/21   Bonnielee Haff, MD  oxyCODONE-acetaminophen (PERCOCET/ROXICET) 5-325 MG tablet Place 1 tablet into feeding tube 3 (three) times daily. 07/12/21   Bonnielee Haff, MD  polyethylene glycol (MIRALAX / GLYCOLAX) 17 g packet Take 17 g by mouth 2 (two) times daily. 07/12/21   Bonnielee Haff, MD  sennosides (SENOKOT) 8.8 MG/5ML syrup Place 10 mLs into feeding tube 2 (two) times daily. 07/12/21   Bonnielee Haff, MD  Water For Irrigation, Sterile (FREE WATER) SOLN Place  75 mLs into feeding tube every 4 (four) hours. 07/12/21   Osvaldo Shipper, MD    Allergies as of 08/23/2022 - Review Complete 07/25/2021  Allergen Reaction Noted   Kaopectate  [attapulgite] Itching 10/19/2012   Thiopental Itching 10/19/2012   Gatifloxacin  08/27/2018   Ceftin [cefuroxime]  09/06/2020   Tomato  11/14/2019    Family History  Family history unknown: Yes    Social History   Socioeconomic History   Marital status: Single    Spouse name: Not on  file   Number of children: Not on file   Years of education: Not on file   Highest education level: Not on file  Occupational History   Not on file  Tobacco Use   Smoking status: Never   Smokeless tobacco: Never  Substance and Sexual Activity   Alcohol use: Not Currently   Drug use: Not Currently   Sexual activity: Not Currently  Other Topics Concern   Not on file  Social History Narrative   Not on file   Social Determinants of Health   Financial Resource Strain: Not on file  Food Insecurity: Not on file  Transportation Needs: Not on file  Physical Activity: Not on file  Stress: Not on file  Social Connections: Not on file  Intimate Partner Violence: Not on file    Review of Systems: See HPI, otherwise negative ROS  Physical Exam: BP 113/84   Pulse (!) 50   Temp (!) 96.5 F (35.8 C) (Temporal)   Resp 20   Ht 5\' 6"  (1.676 m)   Wt 68 kg   LMP  (LMP Unknown)   SpO2 100%   BMI 24.21 kg/m  General:   Alert,  pleasant and cooperative in NAD Head:  Normocephalic and atraumatic. Neck:  Supple; no masses or thyromegaly. Lungs:  Clear throughout to auscultation.    Heart:  Regular rate and rhythm. Abdomen:  Soft, nontender and nondistended. Normal bowel sounds, without guarding, and without rebound.   Neurologic:  Alert and  oriented x4;  grossly normal neurologically.  Impression/Plan: is here for an removal of PEG to be performed for not using PEG  Risks, benefits, limitations, and alternatives regarding PEG removal have been reviewed with the patient.  Questions have been answered.  All parties agreeable.   Glade Nurse, MD  08/29/2022, 11:27 AM

## 2022-08-30 ENCOUNTER — Encounter: Payer: Self-pay | Admitting: Gastroenterology

## 2022-09-08 ENCOUNTER — Encounter: Payer: Self-pay | Admitting: Gastroenterology

## 2023-01-16 ENCOUNTER — Inpatient Hospital Stay
Admission: EM | Admit: 2023-01-16 | Discharge: 2023-02-09 | DRG: 870 | Disposition: A | Discharge status: Skilled Nursing Facility | Payer: Medicare Other | Source: Skilled Nursing Facility | Attending: Osteopathic Medicine | Admitting: Osteopathic Medicine

## 2023-01-16 ENCOUNTER — Other Ambulatory Visit: Payer: Self-pay

## 2023-01-16 ENCOUNTER — Inpatient Hospital Stay: Payer: Medicare Other

## 2023-01-16 ENCOUNTER — Emergency Department: Payer: Medicare Other

## 2023-01-16 DIAGNOSIS — A419 Sepsis, unspecified organism: Secondary | ICD-10-CM

## 2023-01-16 DIAGNOSIS — E871 Hypo-osmolality and hyponatremia: Secondary | ICD-10-CM | POA: Diagnosis present

## 2023-01-16 DIAGNOSIS — E870 Hyperosmolality and hypernatremia: Secondary | ICD-10-CM | POA: Diagnosis present

## 2023-01-16 DIAGNOSIS — E875 Hyperkalemia: Secondary | ICD-10-CM | POA: Diagnosis not present

## 2023-01-16 DIAGNOSIS — R1010 Upper abdominal pain, unspecified: Secondary | ICD-10-CM | POA: Diagnosis not present

## 2023-01-16 DIAGNOSIS — J189 Pneumonia, unspecified organism: Secondary | ICD-10-CM | POA: Diagnosis present

## 2023-01-16 DIAGNOSIS — G9341 Metabolic encephalopathy: Secondary | ICD-10-CM | POA: Diagnosis present

## 2023-01-16 DIAGNOSIS — L03311 Cellulitis of abdominal wall: Secondary | ICD-10-CM | POA: Diagnosis present

## 2023-01-16 DIAGNOSIS — R1310 Dysphagia, unspecified: Secondary | ICD-10-CM | POA: Diagnosis not present

## 2023-01-16 DIAGNOSIS — Z1619 Resistance to other specified beta lactam antibiotics: Secondary | ICD-10-CM | POA: Diagnosis present

## 2023-01-16 DIAGNOSIS — G40909 Epilepsy, unspecified, not intractable, without status epilepticus: Secondary | ICD-10-CM | POA: Diagnosis present

## 2023-01-16 DIAGNOSIS — R4189 Other symptoms and signs involving cognitive functions and awareness: Secondary | ICD-10-CM | POA: Diagnosis present

## 2023-01-16 DIAGNOSIS — E1165 Type 2 diabetes mellitus with hyperglycemia: Secondary | ICD-10-CM | POA: Diagnosis present

## 2023-01-16 DIAGNOSIS — Z79899 Other long term (current) drug therapy: Secondary | ICD-10-CM

## 2023-01-16 DIAGNOSIS — Z1611 Resistance to penicillins: Secondary | ICD-10-CM | POA: Diagnosis present

## 2023-01-16 DIAGNOSIS — A480 Gas gangrene: Secondary | ICD-10-CM | POA: Diagnosis present

## 2023-01-16 DIAGNOSIS — Z7401 Bed confinement status: Secondary | ICD-10-CM

## 2023-01-16 DIAGNOSIS — A4151 Sepsis due to Escherichia coli [E. coli]: Secondary | ICD-10-CM | POA: Diagnosis not present

## 2023-01-16 DIAGNOSIS — E8809 Other disorders of plasma-protein metabolism, not elsewhere classified: Secondary | ICD-10-CM | POA: Diagnosis present

## 2023-01-16 DIAGNOSIS — J9601 Acute respiratory failure with hypoxia: Secondary | ICD-10-CM | POA: Diagnosis present

## 2023-01-16 DIAGNOSIS — K219 Gastro-esophageal reflux disease without esophagitis: Secondary | ICD-10-CM | POA: Diagnosis present

## 2023-01-16 DIAGNOSIS — J95821 Acute postprocedural respiratory failure: Secondary | ICD-10-CM | POA: Diagnosis present

## 2023-01-16 DIAGNOSIS — M24541 Contracture, right hand: Secondary | ICD-10-CM | POA: Diagnosis present

## 2023-01-16 DIAGNOSIS — L02211 Cutaneous abscess of abdominal wall: Secondary | ICD-10-CM | POA: Diagnosis present

## 2023-01-16 DIAGNOSIS — M2459 Contracture, other specified joint: Secondary | ICD-10-CM | POA: Diagnosis present

## 2023-01-16 DIAGNOSIS — R6520 Severe sepsis without septic shock: Secondary | ICD-10-CM | POA: Diagnosis not present

## 2023-01-16 DIAGNOSIS — L89313 Pressure ulcer of right buttock, stage 3: Secondary | ICD-10-CM | POA: Diagnosis present

## 2023-01-16 DIAGNOSIS — Z7189 Other specified counseling: Secondary | ICD-10-CM | POA: Diagnosis not present

## 2023-01-16 DIAGNOSIS — I5042 Chronic combined systolic (congestive) and diastolic (congestive) heart failure: Secondary | ICD-10-CM | POA: Diagnosis present

## 2023-01-16 DIAGNOSIS — Z1152 Encounter for screening for COVID-19: Secondary | ICD-10-CM | POA: Diagnosis not present

## 2023-01-16 DIAGNOSIS — E1152 Type 2 diabetes mellitus with diabetic peripheral angiopathy with gangrene: Secondary | ICD-10-CM | POA: Diagnosis present

## 2023-01-16 DIAGNOSIS — F329 Major depressive disorder, single episode, unspecified: Secondary | ICD-10-CM | POA: Diagnosis present

## 2023-01-16 DIAGNOSIS — Z888 Allergy status to other drugs, medicaments and biological substances status: Secondary | ICD-10-CM

## 2023-01-16 DIAGNOSIS — E039 Hypothyroidism, unspecified: Secondary | ICD-10-CM | POA: Diagnosis present

## 2023-01-16 DIAGNOSIS — L0291 Cutaneous abscess, unspecified: Secondary | ICD-10-CM

## 2023-01-16 DIAGNOSIS — R1312 Dysphagia, oropharyngeal phase: Secondary | ICD-10-CM | POA: Diagnosis present

## 2023-01-16 DIAGNOSIS — I110 Hypertensive heart disease with heart failure: Secondary | ICD-10-CM | POA: Diagnosis present

## 2023-01-16 DIAGNOSIS — Z79890 Hormone replacement therapy (postmenopausal): Secondary | ICD-10-CM

## 2023-01-16 DIAGNOSIS — A4159 Other Gram-negative sepsis: Secondary | ICD-10-CM | POA: Diagnosis present

## 2023-01-16 DIAGNOSIS — R6521 Severe sepsis with septic shock: Secondary | ICD-10-CM | POA: Diagnosis present

## 2023-01-16 DIAGNOSIS — Z881 Allergy status to other antibiotic agents status: Secondary | ICD-10-CM

## 2023-01-16 DIAGNOSIS — Z5309 Procedure and treatment not carried out because of other contraindication: Secondary | ICD-10-CM | POA: Diagnosis present

## 2023-01-16 DIAGNOSIS — E876 Hypokalemia: Secondary | ICD-10-CM | POA: Diagnosis present

## 2023-01-16 DIAGNOSIS — Z87820 Personal history of traumatic brain injury: Secondary | ICD-10-CM

## 2023-01-16 DIAGNOSIS — M24542 Contracture, left hand: Secondary | ICD-10-CM | POA: Diagnosis present

## 2023-01-16 DIAGNOSIS — D72829 Elevated white blood cell count, unspecified: Secondary | ICD-10-CM | POA: Diagnosis not present

## 2023-01-16 DIAGNOSIS — R109 Unspecified abdominal pain: Secondary | ICD-10-CM | POA: Diagnosis present

## 2023-01-16 DIAGNOSIS — K432 Incisional hernia without obstruction or gangrene: Secondary | ICD-10-CM | POA: Diagnosis present

## 2023-01-16 LAB — COMPREHENSIVE METABOLIC PANEL
ALT: 20 U/L (ref 0–44)
AST: 33 U/L (ref 15–41)
Albumin: 1.9 g/dL — ABNORMAL LOW (ref 3.5–5.0)
Alkaline Phosphatase: 133 U/L — ABNORMAL HIGH (ref 38–126)
Anion gap: 5 (ref 5–15)
BUN: 55 mg/dL — ABNORMAL HIGH (ref 6–20)
CO2: 23 mmol/L (ref 22–32)
Calcium: 8 mg/dL — ABNORMAL LOW (ref 8.9–10.3)
Chloride: 125 mmol/L — ABNORMAL HIGH (ref 98–111)
Creatinine, Ser: 0.75 mg/dL (ref 0.44–1.00)
GFR, Estimated: >60 mL/min (ref >60)
Glucose, Bld: 140 mg/dL — ABNORMAL HIGH (ref 70–99)
Potassium: 5.2 mmol/L — ABNORMAL HIGH (ref 3.5–5.1)
Sodium: 153 mmol/L — ABNORMAL HIGH (ref 135–145)
Total Bilirubin: 1.1 mg/dL (ref 0.3–1.2)
Total Protein: 8.1 g/dL (ref 6.5–8.1)

## 2023-01-16 LAB — CBC WITH DIFFERENTIAL/PLATELET
Abs Immature Granulocytes: 0.38 10*3/uL — ABNORMAL HIGH (ref 0.00–0.07)
Basophils Absolute: 0.1 10*3/uL (ref 0.0–0.1)
Basophils Relative: 0 %
Eosinophils Absolute: 0.1 10*3/uL (ref 0.0–0.5)
Eosinophils Relative: 0 %
HCT: 34.8 % — ABNORMAL LOW (ref 36.0–46.0)
Hemoglobin: 10.8 g/dL — ABNORMAL LOW (ref 12.0–15.0)
Immature Granulocytes: 1 %
Lymphocytes Relative: 6 %
Lymphs Abs: 1.9 10*3/uL (ref 0.7–4.0)
MCH: 27.5 pg (ref 26.0–34.0)
MCHC: 31 g/dL (ref 30.0–36.0)
MCV: 88.5 fL (ref 80.0–100.0)
Monocytes Absolute: 1.1 10*3/uL — ABNORMAL HIGH (ref 0.1–1.0)
Monocytes Relative: 3 %
Neutro Abs: 30.7 10*3/uL — ABNORMAL HIGH (ref 1.7–7.7)
Neutrophils Relative %: 90 %
Platelets: 318 10*3/uL (ref 150–400)
RBC: 3.93 MIL/uL (ref 3.87–5.11)
RDW: 14 % (ref 11.5–15.5)
Smear Review: NORMAL
WBC: 34.2 10*3/uL — ABNORMAL HIGH (ref 4.0–10.5)
nRBC: 0 % (ref 0.0–0.2)

## 2023-01-16 LAB — LACTIC ACID, PLASMA: Lactic Acid, Venous: 1.6 mmol/L (ref 0.5–1.9)

## 2023-01-16 LAB — RESP PANEL BY RT-PCR (RSV, FLU A&B, COVID)  RVPGX2
Influenza A by PCR: NEGATIVE
Influenza B by PCR: NEGATIVE
Resp Syncytial Virus by PCR: NEGATIVE
SARS Coronavirus 2 by RT PCR: NEGATIVE

## 2023-01-16 LAB — APTT: aPTT: 41 seconds — ABNORMAL HIGH (ref 24–36)

## 2023-01-16 LAB — PROTIME-INR
INR: 1.5 — ABNORMAL HIGH (ref 0.8–1.2)
Prothrombin Time: 18.2 seconds — ABNORMAL HIGH (ref 11.4–15.2)

## 2023-01-16 MED ORDER — INSULIN ASPART 100 UNIT/ML IJ SOLN: 0.0000 [IU] | Freq: Every day | INTRAMUSCULAR | Status: DC

## 2023-01-16 MED ORDER — LACTATED RINGERS IV BOLUS: 1000.0000 mL | Freq: Once | INTRAVENOUS | Status: DC

## 2023-01-16 MED ORDER — SODIUM CHLORIDE 0.9 % IV BOLUS (SEPSIS)
1000.0000 mL | Freq: Once | INTRAVENOUS | Status: AC
  Administered 2023-01-16: 1000 mL via INTRAVENOUS

## 2023-01-16 MED ORDER — ENOXAPARIN SODIUM 40 MG/0.4ML IJ SOSY
40.0000 mg | PREFILLED_SYRINGE | INTRAMUSCULAR | Status: DC
  Administered 2023-01-17: 40 mg via SUBCUTANEOUS
  Filled 2023-01-16 (×2): qty 0.4

## 2023-01-16 MED ORDER — SODIUM CHLORIDE 0.9 % IV SOLN
500.0000 mg | Freq: Once | INTRAVENOUS | Status: AC
  Administered 2023-01-16: 500 mg via INTRAVENOUS
  Filled 2023-01-16: qty 5

## 2023-01-16 MED ORDER — ACETAMINOPHEN 650 MG RE SUPP: 650.0000 mg | Freq: Four times a day (QID) | RECTAL | Status: DC | PRN

## 2023-01-16 MED ORDER — SODIUM CHLORIDE 0.9 % IV SOLN: 500.0000 mg | INTRAVENOUS | Status: DC

## 2023-01-16 MED ORDER — LACTATED RINGERS IV SOLN: INTRAVENOUS | Status: DC

## 2023-01-16 MED ORDER — ACETAMINOPHEN 160 MG/5ML PO SOLN
650.0000 mg | Freq: Once | ORAL | Status: AC
  Administered 2023-01-17: 650 mg
  Filled 2023-01-16: qty 20.3

## 2023-01-16 MED ORDER — ALBUTEROL SULFATE (2.5 MG/3ML) 0.083% IN NEBU: 2.5000 mg | INHALATION_SOLUTION | Freq: Once | RESPIRATORY_TRACT | Status: DC

## 2023-01-16 MED ORDER — SODIUM CHLORIDE 0.9 % IV SOLN
2.0000 g | Freq: Once | INTRAVENOUS | Status: AC
  Administered 2023-01-16: 2 g via INTRAVENOUS
  Filled 2023-01-16: qty 12.5

## 2023-01-16 MED ORDER — DEXTROSE IN LACTATED RINGERS 5 % IV SOLN: INTRAVENOUS | Status: DC

## 2023-01-16 MED ORDER — ACETAMINOPHEN 325 MG PO TABS: 650.0000 mg | ORAL_TABLET | Freq: Four times a day (QID) | ORAL | Status: DC | PRN

## 2023-01-16 MED ORDER — IOHEXOL 300 MG/ML  SOLN
100.0000 mL | Freq: Once | INTRAMUSCULAR | Status: AC | PRN
  Administered 2023-01-16: 100 mL via INTRAVENOUS

## 2023-01-16 MED ORDER — VANCOMYCIN HCL IN DEXTROSE 1-5 GM/200ML-% IV SOLN: 1000.0000 mg | Freq: Once | INTRAVENOUS | Status: DC

## 2023-01-16 MED ORDER — INSULIN ASPART 100 UNIT/ML IJ SOLN: 0.0000 [IU] | Freq: Three times a day (TID) | INTRAMUSCULAR | Status: DC

## 2023-01-16 MED ORDER — VANCOMYCIN HCL 1750 MG/350ML IV SOLN
1750.0000 mg | Freq: Once | INTRAVENOUS | Status: DC
  Filled 2023-01-16: qty 350

## 2023-01-16 MED ORDER — SODIUM CHLORIDE 0.9% FLUSH
3.0000 mL | Freq: Two times a day (BID) | INTRAVENOUS | Status: DC
  Administered 2023-01-17 – 2023-02-09 (×44): 3 mL via INTRAVENOUS

## 2023-01-16 NOTE — ED Provider Notes (Signed)
[facility] Provider Note    Event Date/Time   First MD Initiated Contact with Patient 01/16/23 2204     (approximate)   History   Code Sepsis   HPI  Joanne [name] is a 52 y.o. female past medical history significant for TBI, bedbound, nonverbal, seizure disorder, who presents to the emergency department for altered mental status and concern for sepsis.  EMS was initially called out for multiple electrolyte abnormalities.  Stated that they were concerned that she was septic.  Patient had a chest x-ray done yesterday and was found to have a pneumonia so they started her on IM Rocephin.  Patient has a PICC line in place.  Chronic indwelling Foley catheter.  Nonverbal at baseline.  Full code.      Physical Exam   Triage Vital Signs: ED Triage Vitals  Enc Vitals Group     BP      Pulse      Resp      Temp      Temp src      SpO2      Weight      Height      Head Circumference      Peak Flow      Pain Score      Pain Loc      Pain Edu?      Excl. in [place]?     Most recent vital signs: Vitals:   01/16/23 2218 01/16/23 2300  BP:  (!) 98/56  Pulse:  (!) 115  Resp:  (!) 40  Temp: (!) 102.5 F (39.2 C)   SpO2:  94%    Physical Exam Constitutional:      General: She is in acute distress.     Appearance: She is well-developed. She is ill-appearing.  HENT:     Head: Atraumatic.  Eyes:     Conjunctiva/sclera: Conjunctivae normal.     Pupils: Pupils are equal, round, and reactive to light.  Neck:     Comments: Leaning to the right side with significant chronic rigidity Cardiovascular:     Rate and Rhythm: Regular rhythm. Tachycardia present.     Heart sounds: Murmur heard.  Pulmonary:     Comments: Tachypneic, coarse breath sounds Abdominal:     General: There is no distension.  Genitourinary:    Comments: Indwelling Foley catheter Musculoskeletal:        General: Normal range of motion.     Cervical back: Rigidity present.  Skin:     General: Skin is warm.     Capillary Refill: Capillary refill takes 2 to 3 seconds.     Comments: PICC line to the left upper extremity with no surrounding erythema or warmth.  Contractures to upper and lower extremities.  Neurological:     Mental Status: She is alert. Mental status is at baseline.     IMPRESSION / MDM / ASSESSMENT AND PLAN / ED COURSE  I reviewed the triage vital signs and the nursing notes.  On arrival patient tachycardic, tachypneic with hypotension.  Clinical picture concerning for sepsis.  Given 1 L of IV fluids and will reevaluate.  Started on antibiotics to cover for hospital-acquired pneumonia with MRSA and Pseudomonas risk factors.    EKG  I, Nathaniel Man, the attending physician, personally viewed and interpreted this ECG.   Rate: 120  Rhythm: Sinus tachycardia  Axis: Normal  Intervals: Normal  ST&T Change: None  Sinus tachycardia while on cardiac telemetry.  RADIOLOGY I independently reviewed imaging, my interpretation of imaging: Chest x-ray -concerning for right lower lobe opacity.  Questionable pneumonia.  LABS (all labs ordered are listed, but only abnormal results are displayed) Labs interpreted as -    Labs Reviewed  COMPREHENSIVE METABOLIC PANEL - Abnormal; Notable for the following components:      Result Value   Sodium 153 (*)    Potassium 5.2 (*)    Chloride 125 (*)    Glucose, Bld 140 (*)    BUN 55 (*)    Calcium 8.0 (*)    Albumin 1.9 (*)    Alkaline Phosphatase 133 (*)    All other components within normal limits  CBC WITH DIFFERENTIAL/PLATELET - Abnormal; Notable for the following components:   WBC 34.2 (*)    Hemoglobin 10.8 (*)    HCT 34.8 (*)    All other components within normal limits  PROTIME-INR - Abnormal; Notable for the following components:   Prothrombin Time 18.2 (*)    INR 1.5 (*)    All other components within normal limits  APTT - Abnormal; Notable for the following components:   aPTT 41 (*)    All  other components within normal limits  RESP PANEL BY RT-PCR (RSV, FLU A&B, COVID)  RVPGX2  CULTURE, BLOOD (ROUTINE X 2)  CULTURE, BLOOD (ROUTINE X 2)  URINE CULTURE  LACTIC ACID, PLASMA  LACTIC ACID, PLASMA     MDM  Clinical picture concerning for sepsis.  Blood cultures obtained.  Exchanged Foley catheter.  Started on antibiotics to cover for MRSA and Pseudomonas given multiple risk factors.  Patient is already received ceftriaxone IM yesterday according to facility.  Blood and urine cultures obtained.  Started on 1 L of IV fluids, felt that 30 cc/kg's of IV fluids may be detrimental to the patient given her hyponatremia, will give 1 L of IV fluids and reevaluate.  11:21 PM -sepsis reevaluation will give another 1 L of LR.  Initial lactic acid within normal limits.  Last blood pressure 90 systolic.  Foley catheter exchanged, started on broad-spectrum antibiotics.  Consulted hospitalist for admission for sepsis.      PROCEDURES:  Critical Care performed: yes  .Critical Care  Performed by: Nathaniel Man, MD Authorized by: Nathaniel Man, MD   Critical care provider statement:    Critical care time (minutes):  45   Critical care time was exclusive of:  Separately billable procedures and treating other patients   Critical care was necessary to treat or prevent imminent or life-threatening deterioration of the following conditions:  Sepsis   Critical care was time spent personally by me on the following activities:  Development of treatment plan with patient or surrogate, discussions with consultants, evaluation of patient's response to treatment, examination of patient, ordering and review of laboratory studies, ordering and review of radiographic studies, ordering and performing treatments and interventions, pulse oximetry, re-evaluation of patient's condition and review of old charts   Patient's presentation is most consistent with acute presentation with potential threat to  life or bodily function.   MEDICATIONS ORDERED IN ED: Medications  lactated ringers infusion (has no administration in time range)  sodium chloride 0.9 % bolus 1,000 mL (1,000 mLs Intravenous New Bag/Given 01/16/23 2247)  ceFEPIme (MAXIPIME) 2 g in sodium chloride 0.9 % 100 mL IVPB (2 g Intravenous New Bag/Given 01/16/23 2307)  azithromycin (ZITHROMAX) 500 mg in sodium chloride 0.9 % 250 mL IVPB (500 mg Intravenous New Bag/Given 01/16/23 2319)  vancomycin (VANCOREADY)  IVPB 1750 mg/350 mL (has no administration in time range)  acetaminophen (TYLENOL) 160 MG/5ML solution 650 mg (has no administration in time range)  lactated ringers bolus 1,000 mL (has no administration in time range)    FINAL CLINICAL IMPRESSION(S) / ED DIAGNOSES   Final diagnoses:  Sepsis, due to unspecified organism, unspecified whether acute organ dysfunction present [facility])     Rx / DC Orders   ED Discharge Orders     None        Note:  This document was prepared using Dragon voice recognition software and may include unintentional dictation errors.   Nathaniel Man, MD 01/16/23 [phone]

## 2023-01-16 NOTE — Assessment & Plan Note (Signed)
Got IV fluids in the ER, check urine sodium.  Trend BMP b at 1 AM this morning

## 2023-01-16 NOTE — Assessment & Plan Note (Signed)
Will treat with albuterol, no peaked T waves on EKG.  Mild.  Monitor response to fluids, recheck BMP at 1 AM

## 2023-01-16 NOTE — ED Notes (Signed)
[facility] [place] notified of patient being admitted to the hospital

## 2023-01-16 NOTE — ED Triage Notes (Signed)
Pt presents to ER via ems from [facility] with c/o abnormal labs. K+ was 6.7 and Na+ was 154 per staff.  EMS states that pt met their sepsis criteria - HR 120, BP 98/35, temp 101.6 axillary.  Pt is at her normal mental status at this time per ems.  Pt had CXR done yesterday as well which showed PNA.  Pt received 1g IM rocephin yesterday per ems.  Pt in NAD on arrival.

## 2023-01-16 NOTE — Assessment & Plan Note (Signed)
Treating with vancomycin plus cefepime plus azithromycin.  Please follow-up cultures

## 2023-01-16 NOTE — Sepsis Progress Note (Signed)
Elink monitoring for the code sepsis protocol.  
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
## 2023-01-16 NOTE — Progress Notes (Signed)
CODE SEPSIS - PHARMACY COMMUNICATION  **Broad Spectrum Antibiotics should be administered within 1 hour of Sepsis diagnosis**  Time Code Sepsis Called/Page Received: 3/26 @ 2215   Antibiotics Ordered: Cefepime 2 gm , Vancomycin 1750 mg   Time of 1st antibiotic administration: Cefepime 2 gm IV X 1 on 3/26 @ 2307  Additional action taken by pharmacy:   If necessary, Name of Provider/Nurse Contacted:     Bradley Handyside D ,PharmD Clinical Pharmacist  01/16/2023  11:13 PM

## 2023-01-16 NOTE — Assessment & Plan Note (Signed)
Getting CT abdomen.

## 2023-01-16 NOTE — Progress Notes (Signed)
PHARMACY -  BRIEF ANTIBIOTIC NOTE   Pharmacy has received consult(s) for Vancomycin, Cefepime from an ED provider.  The patient's profile has been reviewed for ht/wt/allergies/indication/available labs.    One time order(s) placed for Vancomycin 1750 mg IV X 1 and Cefepime 2 gm IV X 1 .   Further antibiotics/pharmacy consults should be ordered by admitting physician if indicated.                       Thank you, Torien Ramroop D 01/16/2023  10:20 PM

## 2023-01-16 NOTE — Assessment & Plan Note (Signed)
[place] due to above, trend

## 2023-01-16 NOTE — Assessment & Plan Note (Signed)
Based on ER report of patient having markedly depressed mental status, at this time patient is more awake.  Therefore feel that patient had acute metabolic encephalopathy at presentation that has improved somewhat, continue with clinical monitoring.  Please note patient does have nonverbal baseline mental status as per report

## 2023-01-16 NOTE — Assessment & Plan Note (Signed)
Lactic acid within normal limits.  Based on abdominal exam, concern for tenderness with focus of infection there.  Therefore will get a CT abdomen.  Otherwise working diagnosis of pneumonia.

## 2023-01-17 ENCOUNTER — Inpatient Hospital Stay: Payer: Medicare Other | Admitting: General Practice

## 2023-01-17 ENCOUNTER — Inpatient Hospital Stay: Payer: Medicare Other

## 2023-01-17 ENCOUNTER — Other Ambulatory Visit: Payer: Self-pay

## 2023-01-17 ENCOUNTER — Encounter
Admission: EM | Disposition: A | Discharge status: Skilled Nursing Facility | Payer: Self-pay | Source: Skilled Nursing Facility | Attending: Internal Medicine

## 2023-01-17 DIAGNOSIS — L0291 Cutaneous abscess, unspecified: Secondary | ICD-10-CM | POA: Diagnosis not present

## 2023-01-17 DIAGNOSIS — J9601 Acute respiratory failure with hypoxia: Secondary | ICD-10-CM

## 2023-01-17 DIAGNOSIS — A419 Sepsis, unspecified organism: Secondary | ICD-10-CM | POA: Diagnosis not present

## 2023-01-17 DIAGNOSIS — L03311 Cellulitis of abdominal wall: Secondary | ICD-10-CM

## 2023-01-17 HISTORY — PX: INCISION AND DRAINAGE OF WOUND: SHX1803

## 2023-01-17 LAB — HEPATIC FUNCTION PANEL
ALT: 18 U/L (ref 0–44)
AST: 26 U/L (ref 15–41)
Albumin: 1.6 g/dL — ABNORMAL LOW (ref 3.5–5.0)
Alkaline Phosphatase: 113 U/L (ref 38–126)
Bilirubin, Direct: 0.2 mg/dL (ref 0.0–0.2)
Indirect Bilirubin: 1 mg/dL — ABNORMAL HIGH (ref 0.3–0.9)
Total Bilirubin: 1.2 mg/dL (ref 0.3–1.2)
Total Protein: 6.9 g/dL (ref 6.5–8.1)

## 2023-01-17 LAB — CK: Total CK: 75 U/L (ref 38–234)

## 2023-01-17 LAB — CBC
HCT: 31.1 % — ABNORMAL LOW (ref 36.0–46.0)
Hemoglobin: 9.2 g/dL — ABNORMAL LOW (ref 12.0–15.0)
MCH: 26.5 pg (ref 26.0–34.0)
MCHC: 29.6 g/dL — ABNORMAL LOW (ref 30.0–36.0)
MCV: 89.6 fL (ref 80.0–100.0)
Platelets: 289 10*3/uL (ref 150–400)
RBC: 3.47 MIL/uL — ABNORMAL LOW (ref 3.87–5.11)
RDW: 14.3 % (ref 11.5–15.5)
WBC: 40.9 10*3/uL — ABNORMAL HIGH (ref 4.0–10.5)
nRBC: 0 % (ref 0.0–0.2)

## 2023-01-17 LAB — LACTIC ACID, PLASMA: Lactic Acid, Venous: 1.3 mmol/L (ref 0.5–1.9)

## 2023-01-17 LAB — BLOOD GAS, ARTERIAL
Acid-base deficit: 4.2 mmol/L — ABNORMAL HIGH (ref 0.0–2.0)
Bicarbonate: 20.1 mmol/L (ref 20.0–28.0)
FIO2: 40 %
MECHVT: 450 mL
Mechanical Rate: 15
O2 Saturation: 99.2 %
PEEP: 5 cmH2O
Patient temperature: 37
pCO2 arterial: 34 mmHg (ref 32–48)
pH, Arterial: 7.38 (ref 7.35–7.45)
pO2, Arterial: 129 mmHg — ABNORMAL HIGH (ref 83–108)

## 2023-01-17 LAB — BASIC METABOLIC PANEL
Anion gap: 14 (ref 5–15)
Anion gap: 14 (ref 5–15)
BUN: 44 mg/dL — ABNORMAL HIGH (ref 6–20)
BUN: 47 mg/dL — ABNORMAL HIGH (ref 6–20)
CO2: 20 mmol/L — ABNORMAL LOW (ref 22–32)
CO2: 20 mmol/L — ABNORMAL LOW (ref 22–32)
Calcium: 7.9 mg/dL — ABNORMAL LOW (ref 8.9–10.3)
Calcium: 7.9 mg/dL — ABNORMAL LOW (ref 8.9–10.3)
Chloride: 121 mmol/L — ABNORMAL HIGH (ref 98–111)
Chloride: 121 mmol/L — ABNORMAL HIGH (ref 98–111)
Creatinine, Ser: 0.68 mg/dL (ref 0.44–1.00)
Creatinine, Ser: 0.7 mg/dL (ref 0.44–1.00)
GFR, Estimated: >60 mL/min (ref >60)
GFR, Estimated: >60 mL/min (ref >60)
Glucose, Bld: 184 mg/dL — ABNORMAL HIGH (ref 70–99)
Glucose, Bld: 187 mg/dL — ABNORMAL HIGH (ref 70–99)
Potassium: 3.1 mmol/L — ABNORMAL LOW (ref 3.5–5.1)
Potassium: 3.1 mmol/L — ABNORMAL LOW (ref 3.5–5.1)
Sodium: 155 mmol/L — ABNORMAL HIGH (ref 135–145)
Sodium: 155 mmol/L — ABNORMAL HIGH (ref 135–145)

## 2023-01-17 LAB — GLUCOSE, CAPILLARY
Glucose-Capillary: 105 mg/dL — ABNORMAL HIGH (ref 70–99)
Glucose-Capillary: 121 mg/dL — ABNORMAL HIGH (ref 70–99)
Glucose-Capillary: 131 mg/dL — ABNORMAL HIGH (ref 70–99)
Glucose-Capillary: 144 mg/dL — ABNORMAL HIGH (ref 70–99)
Glucose-Capillary: 152 mg/dL — ABNORMAL HIGH (ref 70–99)
Glucose-Capillary: 215 mg/dL — ABNORMAL HIGH (ref 70–99)
Glucose-Capillary: 93 mg/dL (ref 70–99)

## 2023-01-17 LAB — SODIUM, URINE, RANDOM: Sodium, Ur: 19 mmol/L

## 2023-01-17 LAB — TROPONIN I (HIGH SENSITIVITY)
Troponin I (High Sensitivity): 1217 ng/L (ref <18)
Troponin I (High Sensitivity): 999 ng/L (ref <18)

## 2023-01-17 LAB — BRAIN NATRIURETIC PEPTIDE: B Natriuretic Peptide: 374.7 pg/mL — ABNORMAL HIGH (ref 0.0–100.0)

## 2023-01-17 LAB — URINALYSIS, COMPLETE (UACMP) WITH MICROSCOPIC
Bilirubin Urine: NEGATIVE
Glucose, UA: NEGATIVE mg/dL
Ketones, ur: NEGATIVE mg/dL
Nitrite: NEGATIVE
Protein, ur: 30 mg/dL — AB
Specific Gravity, Urine: 1.033 — ABNORMAL HIGH (ref 1.005–1.030)
WBC, UA: >50 WBC/hpf (ref 0–5)
pH: 5 (ref 5.0–8.0)

## 2023-01-17 LAB — HIV ANTIBODY (ROUTINE TESTING W REFLEX): HIV Screen 4th Generation wRfx: NONREACTIVE

## 2023-01-17 LAB — PROTIME-INR
INR: 1.6 — ABNORMAL HIGH (ref 0.8–1.2)
Prothrombin Time: 19.1 seconds — ABNORMAL HIGH (ref 11.4–15.2)

## 2023-01-17 LAB — MRSA NEXT GEN BY PCR, NASAL: MRSA by PCR Next Gen: NOT DETECTED

## 2023-01-17 LAB — MAGNESIUM: Magnesium: 1.9 mg/dL (ref 1.7–2.4)

## 2023-01-17 LAB — APTT: aPTT: 39 seconds — ABNORMAL HIGH (ref 24–36)

## 2023-01-17 LAB — PROCALCITONIN: Procalcitonin: 1.96 ng/mL

## 2023-01-17 LAB — PHOSPHORUS: Phosphorus: 3 mg/dL (ref 2.5–4.6)

## 2023-01-17 SURGERY — IRRIGATION AND DEBRIDEMENT WOUND
Anesthesia: General | Site: Perineum

## 2023-01-17 MED ORDER — DOCUSATE SODIUM 50 MG/5ML PO LIQD
100.0000 mg | Freq: Two times a day (BID) | ORAL | Status: DC
  Administered 2023-01-17 – 2023-01-23 (×7): 100 mg
  Filled 2023-01-17 (×7): qty 10

## 2023-01-17 MED ORDER — FENTANYL CITRATE PF 50 MCG/ML IJ SOSY: 50.0000 ug | PREFILLED_SYRINGE | Freq: Once | INTRAMUSCULAR | Status: DC

## 2023-01-17 MED ORDER — PHENYLEPHRINE HCL (PRESSORS) 10 MG/ML IV SOLN
INTRAVENOUS | Status: DC | PRN
  Administered 2023-01-17 (×2): 240 ug via INTRAVENOUS
  Administered 2023-01-17: 160 ug via INTRAVENOUS

## 2023-01-17 MED ORDER — LEVOTHYROXINE SODIUM 112 MCG PO TABS
112.0000 ug | ORAL_TABLET | Freq: Every day | ORAL | Status: DC
  Administered 2023-01-17 – 2023-02-09 (×24): 112 ug
  Filled 2023-01-17 (×24): qty 1

## 2023-01-17 MED ORDER — MIDAZOLAM HCL 2 MG/2ML IJ SOLN
1.0000 mg | INTRAMUSCULAR | Status: DC | PRN
  Administered 2023-01-19: 2 mg via INTRAVENOUS
  Administered 2023-01-19: 1 mg via INTRAVENOUS
  Administered 2023-01-19 (×2): 2 mg via INTRAVENOUS
  Filled 2023-01-17 (×5): qty 2

## 2023-01-17 MED ORDER — FENTANYL CITRATE (PF) 100 MCG/2ML IJ SOLN
INTRAMUSCULAR | Status: AC
  Filled 2023-01-17: qty 2

## 2023-01-17 MED ORDER — POTASSIUM CHLORIDE 20 MEQ PO PACK
40.0000 meq | PACK | ORAL | Status: AC
  Administered 2023-01-17 (×2): 40 meq
  Filled 2023-01-17: qty 2

## 2023-01-17 MED ORDER — PROPOFOL 1000 MG/100ML IV EMUL: 0.0000 ug/kg/min | INTRAVENOUS | Status: DC

## 2023-01-17 MED ORDER — VANCOMYCIN HCL 1750 MG/350ML IV SOLN
1750.0000 mg | INTRAVENOUS | Status: DC
  Administered 2023-01-17: 1750 mg via INTRAVENOUS
  Filled 2023-01-17: qty 350

## 2023-01-17 MED ORDER — LINEZOLID 600 MG/300ML IV SOLN
600.0000 mg | Freq: Two times a day (BID) | INTRAVENOUS | Status: DC
  Administered 2023-01-17 – 2023-01-22 (×11): 600 mg via INTRAVENOUS
  Filled 2023-01-17 (×11): qty 300

## 2023-01-17 MED ORDER — ALBUMIN HUMAN 25 % IV SOLN
25.0000 g | Freq: Four times a day (QID) | INTRAVENOUS | Status: AC
  Administered 2023-01-17 – 2023-01-18 (×4): 25 g via INTRAVENOUS
  Filled 2023-01-17 (×4): qty 100

## 2023-01-17 MED ORDER — FENTANYL BOLUS VIA INFUSION
50.0000 ug | INTRAVENOUS | Status: DC | PRN
  Administered 2023-01-18 – 2023-01-19 (×5): 100 ug via INTRAVENOUS
  Administered 2023-01-21 – 2023-01-22 (×4): 50 ug via INTRAVENOUS

## 2023-01-17 MED ORDER — CHLORHEXIDINE GLUCONATE CLOTH 2 % EX PADS
6.0000 | MEDICATED_PAD | Freq: Every day | CUTANEOUS | Status: DC
  Administered 2023-01-17 – 2023-02-09 (×25): 6 via TOPICAL

## 2023-01-17 MED ORDER — INSULIN ASPART 100 UNIT/ML IJ SOLN
0.0000 [IU] | INTRAMUSCULAR | Status: DC
  Administered 2023-01-17: 3 [IU] via SUBCUTANEOUS
  Administered 2023-01-17: 1 [IU] via SUBCUTANEOUS
  Administered 2023-01-17 – 2023-01-18 (×2): 2 [IU] via SUBCUTANEOUS
  Administered 2023-01-18: 1 [IU] via SUBCUTANEOUS
  Administered 2023-01-18 – 2023-01-20 (×3): 2 [IU] via SUBCUTANEOUS
  Administered 2023-01-20 (×2): 1 [IU] via SUBCUTANEOUS
  Administered 2023-01-20 – 2023-01-21 (×2): 2 [IU] via SUBCUTANEOUS
  Administered 2023-01-23 – 2023-01-25 (×6): 1 [IU] via SUBCUTANEOUS
  Filled 2023-01-17 (×13): qty 1

## 2023-01-17 MED ORDER — POTASSIUM CHLORIDE 10 MEQ/50ML IV SOLN
10.0000 meq | INTRAVENOUS | Status: DC
  Filled 2023-01-17 (×2): qty 50

## 2023-01-17 MED ORDER — NOREPINEPHRINE 4 MG/250ML-% IV SOLN
INTRAVENOUS | Status: AC
  Filled 2023-01-17: qty 250

## 2023-01-17 MED ORDER — JUVEN PO PACK
1.0000 | PACK | Freq: Two times a day (BID) | ORAL | Status: DC
  Administered 2023-01-18: 1 via ORAL

## 2023-01-17 MED ORDER — EPHEDRINE SULFATE (PRESSORS) 50 MG/ML IJ SOLN
INTRAMUSCULAR | Status: DC | PRN
  Administered 2023-01-17: 10 mg via INTRAVENOUS

## 2023-01-17 MED ORDER — SODIUM CHLORIDE 0.9 % IV SOLN
2.0000 g | Freq: Three times a day (TID) | INTRAVENOUS | Status: DC
  Administered 2023-01-17: 2 g via INTRAVENOUS
  Filled 2023-01-17 (×2): qty 12.5

## 2023-01-17 MED ORDER — LACTATED RINGERS IV SOLN: INTRAVENOUS | Status: DC | PRN

## 2023-01-17 MED ORDER — LIDOCAINE HCL (CARDIAC) PF 100 MG/5ML IV SOSY
PREFILLED_SYRINGE | INTRAVENOUS | Status: DC | PRN
  Administered 2023-01-17: 60 mg via INTRAVENOUS

## 2023-01-17 MED ORDER — BUPIVACAINE HCL (PF) 0.5 % IJ SOLN
INTRAMUSCULAR | Status: AC
  Filled 2023-01-17: qty 30

## 2023-01-17 MED ORDER — POLYETHYLENE GLYCOL 3350 17 G PO PACK: 17.0000 g | PACK | Freq: Every day | ORAL | Status: DC

## 2023-01-17 MED ORDER — PHENYLEPHRINE HCL-NACL 20-0.9 MG/250ML-% IV SOLN
INTRAVENOUS | Status: DC | PRN
  Administered 2023-01-17: 50 ug/min via INTRAVENOUS

## 2023-01-17 MED ORDER — 0.9 % SODIUM CHLORIDE (POUR BTL) OPTIME
TOPICAL | Status: DC | PRN
  Administered 2023-01-17 (×2): 500 mL

## 2023-01-17 MED ORDER — ORAL CARE MOUTH RINSE: 15.0000 mL | OROMUCOSAL | Status: DC | PRN

## 2023-01-17 MED ORDER — BUPIVACAINE HCL (PF) 0.5 % IJ SOLN
INTRAMUSCULAR | Status: DC | PRN
  Administered 2023-01-17: 20 mL

## 2023-01-17 MED ORDER — PROPOFOL 10 MG/ML IV BOLUS
INTRAVENOUS | Status: AC
  Filled 2023-01-17: qty 20

## 2023-01-17 MED ORDER — FENTANYL CITRATE (PF) 100 MCG/2ML IJ SOLN
INTRAMUSCULAR | Status: DC | PRN
  Administered 2023-01-17: 50 ug via INTRAVENOUS

## 2023-01-17 MED ORDER — ROCURONIUM BROMIDE 100 MG/10ML IV SOLN
INTRAVENOUS | Status: DC | PRN
  Administered 2023-01-17: 50 mg via INTRAVENOUS

## 2023-01-17 MED ORDER — BACLOFEN 10 MG PO TABS
10.0000 mg | ORAL_TABLET | Freq: Three times a day (TID) | ORAL | Status: DC
  Administered 2023-01-17 – 2023-02-09 (×68): 10 mg
  Filled 2023-01-17 (×71): qty 1

## 2023-01-17 MED ORDER — FAMOTIDINE 20 MG PO TABS
20.0000 mg | ORAL_TABLET | Freq: Two times a day (BID) | ORAL | Status: DC
  Administered 2023-01-17 – 2023-02-09 (×47): 20 mg
  Filled 2023-01-17 (×47): qty 1

## 2023-01-17 MED ORDER — PROPOFOL 10 MG/ML IV BOLUS
INTRAVENOUS | Status: DC | PRN
  Administered 2023-01-17: 70 mg via INTRAVENOUS

## 2023-01-17 MED ORDER — SUCCINYLCHOLINE CHLORIDE 200 MG/10ML IV SOSY
PREFILLED_SYRINGE | INTRAVENOUS | Status: AC
  Filled 2023-01-17: qty 10

## 2023-01-17 MED ORDER — NOREPINEPHRINE 4 MG/250ML-% IV SOLN
0.0000 ug/min | INTRAVENOUS | Status: DC
  Administered 2023-01-17: 7 ug/min via INTRAVENOUS
  Administered 2023-01-18 – 2023-01-20 (×2): 2 ug/min via INTRAVENOUS
  Administered 2023-01-20: 4 ug/min via INTRAVENOUS
  Filled 2023-01-17 (×4): qty 250

## 2023-01-17 MED ORDER — LIDOCAINE HCL (PF) 2 % IJ SOLN
INTRAMUSCULAR | Status: AC
  Filled 2023-01-17: qty 5

## 2023-01-17 MED ORDER — CARBAMAZEPINE 100 MG PO CHEW
50.0000 mg | CHEWABLE_TABLET | Freq: Four times a day (QID) | ORAL | Status: DC
  Administered 2023-01-17 – 2023-01-19 (×8): 50 mg
  Filled 2023-01-17 (×11): qty 0.5

## 2023-01-17 MED ORDER — SERTRALINE HCL 50 MG PO TABS
25.0000 mg | ORAL_TABLET | Freq: Every day | ORAL | Status: DC
  Administered 2023-01-17 – 2023-02-09 (×24): 25 mg
  Filled 2023-01-17 (×24): qty 1

## 2023-01-17 MED ORDER — SUCCINYLCHOLINE CHLORIDE 200 MG/10ML IV SOSY
PREFILLED_SYRINGE | INTRAVENOUS | Status: DC | PRN
  Administered 2023-01-17: 120 mg via INTRAVENOUS

## 2023-01-17 MED ORDER — VITAL AF 1.2 CAL PO LIQD
1000.0000 mL | ORAL | Status: DC
  Administered 2023-01-17 – 2023-01-18 (×2): 1000 mL

## 2023-01-17 MED ORDER — ORAL CARE MOUTH RINSE
15.0000 mL | OROMUCOSAL | Status: DC
  Administered 2023-01-17 – 2023-01-24 (×88): 15 mL via OROMUCOSAL

## 2023-01-17 MED ORDER — FENTANYL 2500MCG IN NS 250ML (10MCG/ML) PREMIX INFUSION
50.0000 ug/h | INTRAVENOUS | Status: DC
  Administered 2023-01-17: 150 ug/h via INTRAVENOUS
  Administered 2023-01-17: 125 ug/h via INTRAVENOUS
  Administered 2023-01-18: 150 ug/h via INTRAVENOUS
  Administered 2023-01-19 – 2023-01-20 (×3): 175 ug/h via INTRAVENOUS
  Administered 2023-01-21: 125 ug/h via INTRAVENOUS
  Administered 2023-01-21 – 2023-01-22 (×2): 200 ug/h via INTRAVENOUS
  Filled 2023-01-17 (×10): qty 250

## 2023-01-17 MED ORDER — FREE WATER
200.0000 mL | Status: DC
  Administered 2023-01-17 – 2023-01-18 (×7): 200 mL

## 2023-01-17 MED ORDER — PIPERACILLIN-TAZOBACTAM 3.375 G IVPB
3.3750 g | Freq: Three times a day (TID) | INTRAVENOUS | Status: AC
  Administered 2023-01-17 – 2023-01-26 (×29): 3.375 g via INTRAVENOUS
  Filled 2023-01-17 (×29): qty 50

## 2023-01-17 MED ORDER — POTASSIUM CHLORIDE 20 MEQ PO PACK
40.0000 meq | PACK | Freq: Once | ORAL | Status: DC
  Filled 2023-01-17: qty 2

## 2023-01-17 MED ORDER — SODIUM CHLORIDE 0.9 % IV SOLN: 500.0000 mg | INTRAVENOUS | Status: DC

## 2023-01-17 MED ORDER — PHENYLEPHRINE HCL-NACL 20-0.9 MG/250ML-% IV SOLN
INTRAVENOUS | Status: AC
  Filled 2023-01-17: qty 250

## 2023-01-17 MED ORDER — ROCURONIUM BROMIDE 10 MG/ML (PF) SYRINGE
PREFILLED_SYRINGE | INTRAVENOUS | Status: AC
  Filled 2023-01-17: qty 10

## 2023-01-17 MED ORDER — VASOPRESSIN 20 UNIT/ML IV SOLN
INTRAVENOUS | Status: DC | PRN
  Administered 2023-01-17 (×4): 2 [IU] via INTRAVENOUS
  Administered 2023-01-17: 1 [IU] via INTRAVENOUS
  Administered 2023-01-17 (×2): 2 [IU] via INTRAVENOUS

## 2023-01-17 MED ORDER — FREE WATER
30.0000 mL | Status: DC
  Administered 2023-01-17: 30 mL

## 2023-01-17 MED ORDER — NOREPINEPHRINE 4 MG/250ML-% IV SOLN
2.0000 ug/min | INTRAVENOUS | Status: DC
  Administered 2023-01-17: 5 ug/min via INTRAVENOUS
  Filled 2023-01-17: qty 250

## 2023-01-17 SURGICAL SUPPLY — 30 items
APL PRP STRL LF DISP 70% ISPRP (MISCELLANEOUS) ×1
BNDG CMPR 5X6 CHSV STRCH STRL (GAUZE/BANDAGES/DRESSINGS) ×1
BNDG CMPR 75X21 PLY HI ABS (MISCELLANEOUS)
BNDG COHESIVE 6X5 TAN ST LF (GAUZE/BANDAGES/DRESSINGS) ×1 IMPLANT
CANISTER WOUND CARE 500ML ATS (WOUND CARE) ×1 IMPLANT
CHLORAPREP W/TINT 26 (MISCELLANEOUS) ×1 IMPLANT
DRSG EMULSION OIL 3X3 NADH (GAUZE/BANDAGES/DRESSINGS) ×1 IMPLANT
DRSG VAC GRANUFOAM MED (GAUZE/BANDAGES/DRESSINGS) ×1 IMPLANT
ELECT REM PT RETURN 9FT ADLT (ELECTROSURGICAL) ×1
ELECTRODE REM PT RTRN 9FT ADLT (ELECTROSURGICAL) ×1 IMPLANT
GAUZE SPONGE 4X4 12PLY STRL (GAUZE/BANDAGES/DRESSINGS) IMPLANT
GAUZE STRETCH 2X75IN STRL (MISCELLANEOUS) IMPLANT
GLOVE BIOGEL PI IND STRL 7.0 (GLOVE) ×1 IMPLANT
GLOVE SURG SYN 6.5 ES PF (GLOVE) ×1 IMPLANT
GLOVE SURG SYN 6.5 PF PI (GLOVE) ×1 IMPLANT
GOWN STRL REUS W/ TWL LRG LVL3 (GOWN DISPOSABLE) ×1 IMPLANT
GOWN STRL REUS W/ TWL XL LVL3 (GOWN DISPOSABLE) ×1 IMPLANT
GOWN STRL REUS W/TWL LRG LVL3 (GOWN DISPOSABLE) ×1
GOWN STRL REUS W/TWL XL LVL3 (GOWN DISPOSABLE) ×1
KIT TURNOVER KIT A (KITS) ×1 IMPLANT
LABEL OR SOLS (LABEL) ×1 IMPLANT
MANIFOLD NEPTUNE II (INSTRUMENTS) ×1 IMPLANT
NS IRRIG 500ML POUR BTL (IV SOLUTION) ×1 IMPLANT
PACK EXTREMITY ARMC (MISCELLANEOUS) ×1 IMPLANT
PAD PREP 24X41 OB/GYN DISP (PERSONAL CARE ITEMS) ×1 IMPLANT
SOL PREP PVP 2OZ (MISCELLANEOUS) ×1
SOLUTION PREP PVP 2OZ (MISCELLANEOUS) ×1 IMPLANT
STOCKINETTE IMPERV 14X48 (MISCELLANEOUS) ×1 IMPLANT
TRAP FLUID SMOKE EVACUATOR (MISCELLANEOUS) ×1 IMPLANT
WATER STERILE IRR 500ML POUR (IV SOLUTION) ×1 IMPLANT
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
## 2023-01-17 NOTE — Assessment & Plan Note (Signed)
C.w. vancomyinc. CT abd pend.

## 2023-01-17 NOTE — Consult Note (Addendum)
Subjective:   CC: fournier's gangerne  HPI:  Joanne [name] is a 52 y.o. female who was consulted by Elvia Collum for evaluation of above.  Patient is nonverbal.  History obtained from verbal report as well as chart review.  Patient presenting with concerns for sepsis.  Diagnosed with pneumonia a couple days ago and started on antibiotics.  First documentation of concern for abdominal wall cellulitis during H&P by hospitalist.  CT obtained and noted concerning for Fournier's gangrene.  Surgery consulted.  History of chronic indwelling Foley catheter   Past Medical History:  has a past medical history of Acute kidney injury (AKI) with acute tubular necrosis (ATN) (HCC), Acute on chronic combined systolic and diastolic CHF (congestive heart failure) ([place]) (06/06/2018), Acute on chronic respiratory failure with hypoxia ([place]) (06/06/2018), ARDS (adult respiratory distress syndrome) ([place]) (06/06/2018), Aspiration pneumonia due to gastric secretions ([place]), Diabetes mellitus ([place]), History of traumatic brain injury, Hypothyroidism, Pneumonia due to Klebsiella pneumoniae ([place]) (06/06/2018), Seizure disorder ([place]), Severe sepsis ([place]) (06/06/2018), Tracheostomy status ([place]) (06/06/2018), and Traumatic brain injury ([place]).  Past Surgical History:  has a past surgical history that includes Tracheostomy; PEG placement (N/A, 05/27/2020); IR Replc Gastro/Colonic Tube Percut W/Fluoro (07/11/2021); and Percutaneous endoscopic gastrostomy (peg) removal (N/A, 08/29/2022).  Family History: Family history is unknown by patient.  Social History:  reports that she has never smoked. She has never used smokeless tobacco. She reports that she does not currently use alcohol. She reports that she does not currently use drugs.  Current Medications:  Prior to Admission medications   Medication Sig Start Date End Date Taking? Authorizing Provider  acetaminophen (TYLENOL) 325 MG tablet Take 650 mg by mouth every 8 (eight) hours as needed for  mild pain or moderate pain.   Yes [provider]  albuterol (PROVENTIL) (2.5 MG/3ML) 0.083% nebulizer solution Take 3 mLs (2.5 mg total) by nebulization every 4 (four) hours as needed for wheezing or shortness of breath. 07/12/21  Yes Bonnielee Haff, MD  ascorbic acid (VITAMIN C) 500 MG tablet Take 1,000 mg by mouth daily.   Yes [provider]  baclofen (LIORESAL) 10 MG tablet Take 10 mg by mouth 3 (three) times daily. 07/19/20  Yes [provider]  carbamazepine (TEGRETOL XR) 100 MG 12 hr tablet Take 100 mg by mouth 2 (two) times daily.   Yes [provider]  cefTRIAXone (ROCEPHIN) IVPB 2 g once.   Yes [provider]  Cranberry 450 MG CAPS Take 450 mg by mouth daily.   Yes [provider]  docusate (COLACE) 50 MG/5ML liquid Place 10 mLs (100 mg total) into feeding tube 2 (two) times daily. Patient taking differently: Take 100 mg by mouth 2 (two) times daily. 07/12/21  Yes Bonnielee Haff, MD  doxycycline 100 mg in sodium chloride 0.9 % 250 mL Inject 100 mg into the vein every 12 (twelve) hours.   Yes [provider]  ertapenem (INVANZ) IVPB 1 g daily. 01/15/23 01/23/23 Yes [provider]  ferrous sulfate 325 (65 FE) MG tablet Take 325 mg by mouth every other day.   Yes [provider]  gabapentin (NEURONTIN) 100 MG capsule Take 100 mg by mouth 2 (two) times daily.   Yes [provider]  lactobacillus acidophilus & bulgar (LACTINEX) chewable tablet Chew 1 tablet by mouth 2 (two) times daily.   Yes [provider]  levothyroxine (SYNTHROID) 112 MCG tablet Take 1 tablet (112 mcg total) by mouth daily before breakfast. 07/12/21  Yes Bonnielee Haff,  MD  oxyCODONE-acetaminophen (PERCOCET/ROXICET) 5-325 MG tablet Place 1 tablet into feeding tube 3 (three) times daily. Patient taking differently: Take 1 tablet by mouth 3 (three) times daily. 07/12/21  Yes Bonnielee Haff, MD  polyethylene glycol (MIRALAX /  GLYCOLAX) 17 g packet Take 17 g by mouth 2 (two) times daily. 07/12/21  Yes Bonnielee Haff, MD  senna (SENOKOT) 8.6 MG TABS tablet Take 2 tablets by mouth 2 (two) times daily.   Yes [provider]  sertraline (ZOLOFT) 25 MG tablet Take 25 mg by mouth daily.   Yes [provider]  sodium chloride 0.45 % solution Inject 70 mLs into the vein continuous. 70 ml per hour for 2 liters   Yes [provider]  Water For Irrigation, Sterile (FREE WATER) SOLN Place 75 mLs into feeding tube every 4 (four) hours. 07/12/21  Yes Bonnielee Haff, MD  bisacodyl (FLEET) 10 MG/30ML ENEM Place 10 mg rectally once.    [provider]  midodrine (PROAMATINE) 10 MG tablet Place 1 tablet (10 mg total) into feeding tube 3 (three) times daily as needed (if SBP < 100). 04/30/21   Val Riles, MD  sodium polystyrene (KAYEXALATE) 15 GM/60ML suspension Take 60 g by mouth as needed.    [provider]    Allergies:  Allergies  Allergen Reactions   Kaopectate  [Attapulgite] Itching   Thiopental Itching   Gatifloxacin     Other reaction(s): Unknown   Ceftin [Cefuroxime]     Tolerates cefepime, ceftriaxone, ampicillin/sulbactam, piperacillin/tazobactam   Tomato     ROS:  Unable to obtain secondary to patient baseline mentation    Objective:     BP 93/63   Pulse (!) 115   Temp 99.2 F (37.3 C) (Axillary)   Resp (!) 34   Wt 70 kg   LMP  (LMP Unknown)   SpO2 94%   BMI 24.91 kg/m   Constitutional :  alert and mild distress  Lymphatics/Throat:  no asymmetry, masses, or scars  Respiratory:  clear to auscultation bilaterally  Cardiovascular:  Tachycardia rate  Gastrointestinal: soft, non-tender; bowel sounds normal; no masses,  no organomegaly.  Musculoskeletal: Severe contractions  Skin: Cool and moist, midline abdominal surgical scar.  Obvious cellulitis extending in the suprapubic area towards the genitals, soiled in feces at time of exam.  Unable to do a adequate  genital and pelvic exam due to contractures.  Patient yells out in pain during exam as well       LABS:     Latest Ref Rng & Units 01/16/2023   10:33 PM 07/12/2021    6:03 AM 07/10/2021    5:44 AM  CMP  Glucose 70 - 99 mg/dL 140  139  137   BUN 6 - 20 mg/dL 55  13  24   Creatinine 0.44 - 1.00 mg/dL 0.75  0.44  0.48   Sodium 135 - 145 mmol/L 153  144  144   Potassium 3.5 - 5.1 mmol/L 5.2  3.6  3.7   Chloride 98 - 111 mmol/L 125  108  109   CO2 22 - 32 mmol/L 23  30  29    Calcium 8.9 - 10.3 mg/dL 8.0  8.3  8.2   Total Protein 6.5 - 8.1 g/dL 8.1     Total Bilirubin 0.3 - 1.2 mg/dL 1.1     Alkaline Phos 38 - 126 U/L 133     AST 15 - 41 U/L 33     ALT 0 - 44  U/L 20         Latest Ref Rng & Units 01/16/2023   10:33 PM 07/12/2021    6:03 AM 07/11/2021    4:43 AM  CBC  WBC 4.0 - 10.5 K/uL 34.2  13.6  13.6   Hemoglobin 12.0 - 15.0 g/dL 10.8  10.8  8.9   Hematocrit 36.0 - 46.0 % 34.8  33.5  28.2   Platelets 150 - 400 K/uL 318  328  313     RADS: CLINICAL DATA:  Acute nonlocalized abdominal pain. Sepsis. Abnormal labs. Fever.   EXAM: CT ABDOMEN AND PELVIS WITH CONTRAST   TECHNIQUE: Multidetector CT imaging of the abdomen and pelvis was performed using the standard protocol following bolus administration of intravenous contrast.   RADIATION DOSE REDUCTION: This exam was performed according to the departmental dose-optimization program which includes automated exposure control, adjustment of the mA and/or kV according to patient size and/or use of iterative reconstruction technique.   CONTRAST:  180mL OMNIPAQUE IOHEXOL 300 MG/ML  SOLN   COMPARISON:  07/05/2021   FINDINGS: Lower chest: Severe kyphosis and patient positioning limited evaluation. Visualized lung bases are clear.   Hepatobiliary: No focal liver abnormality is seen. Status post cholecystectomy. No biliary dilatation.   Pancreas: Unremarkable. No pancreatic ductal dilatation or surrounding inflammatory  changes.   Spleen: Calcified granulomas in the spleen.  No focal lesions.   Adrenals/Urinary Tract: Calcification of the right adrenal gland likely representing old infection or hemorrhage. No imaging follow-up is indicated. No adrenal gland nodules. Kidneys are symmetrical. No hydronephrosis or hydroureter. Delayed appearance of contrast material in the renal collecting systems may indicate metabolic disease. Bladder is decompressed with a Foley catheter in place.   Stomach/Bowel: Stomach, small bowel, and colon are not abnormally distended. No wall thickening or inflammatory changes are appreciated. Broad-based anterior abdominal wall hernia containing small and large bowel but without proximal obstruction.   Vascular/Lymphatic: No significant vascular findings are present. No enlarged abdominal or pelvic lymph nodes.   Reproductive: Uterus and bilateral adnexa are unremarkable.   Other: There is extensive soft tissue stranding and soft tissue gas in the anterior low pelvic wall and extending to the genital region. Loculated collection in the left vulvar region measuring about 4 cm diameter and another loculated collection in the inferior rectus abdominus muscles measuring 1.4 x 6.5 cm. Changes are consistent with cellulitis and abscesses. 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   Musculoskeletal: Postoperative fixation of the left proximal femur. Degenerative changes in the spine. Thoracolumbar kyphosis scoliosis.   IMPRESSION: 1. Extensive soft tissue infiltration, soft tissue gas, and loculated collections in the low anterior pelvic fat extending into the genital region consistent with Fournier's gas gangrene. 2. Delayed appearance of contrast material in the renal collecting systems may indicate metabolic disease. No evidence of obstruction. 3. Broad-based ventral abdominal wall hernia containing small and large bowel without proximal obstruction. 4. Foley  catheter deflate the bladder.   Critical Value/emergent results were called by telephone at the time of interpretation on 01/17/2023 at 12:20 am to provider [facility] GOEL , who verbally acknowledged these results.     Electronically Signed   By: Lucienne Capers M.D.   On: 01/17/2023 00:23    Assessment:   Fournier's gangrene Sepsis   Plan:     Patient requires urgent debridement of the area to prevent further decline in health, possible death.  Antibiotics and sepsis protocol initiated by ED.  Multiple attempts made to contact the legal  guardian listed in the chart, Secondary to patient baseline status and report of patient being a ward of state.  Guardian listed is Reginold Agent mobile number at [phone] call multiple times and left voicemail to call back as soon as messages received to discuss urgent debridement.  Additional home phone number of [phone] also tried but this number Getting busy signals.  Contact information was confirmed with [facility].  Will continue to try to reach legal guardian to obtain consent to proceed with surgery.  Initial consultation request received at 0046, workup completed at 0134.  UPDATE: no call back from guardian at 0146.  Due to emergent nature of need for debridement, will proceed with arranging transfer to OR for debridement for now.  If still no call by time room is ready, will proceed with two physician documentation and consent to proceed with emergency surgery.  I have asked admitting hospitalist Dr. Elvia Collum to provide second physician consent to proceed.  Documentation completed in chart.  labs/images/medications/previous chart entries reviewed personally and relevant changes/updates noted above.

## 2023-01-17 NOTE — Progress Notes (Signed)
Spoke with Dr Mortimer Fries regarding patients orders for her foley catheter. Per Dr. Mortimer Fries do not replace her existing foley. Continue to assess.

## 2023-01-17 NOTE — Consult Note (Signed)
[place] Nurse Consult Note: Reason for Consult: chronic wounds Patient from SNF, bedbound, seizure disorder. Admitted for sepsis. Found to have SP abscess suspect for NF, to surgery this am per Dr. Lysle Pearl for I&D of this area. WOC will address chronic LE wound and chronic sacral wounds Wound type: Stage 3 Pressure injury: sacrum; evidence of a large area of re-epiethelization. Still has two open areas; full thickness; clean,100%  pink, moist.  Scabbed areas on the right dorsal foot;right dorsal great toe; and right second toe. Stable, clean; significant foot drop/deformity  Pressure Injury POA: Yes Measurement:see nursing flow sheets Wound bed:see above  Drainage (amount, consistency, odor) minimal noted from sacral wounds; none from the foot/LE wounds Periwound: intact, see above, epibole of the wound edges  Dressing procedure/placement/frequency: Cleanse sacral wounds with saline, pat dry Apply saline gel (hydrogel) to the open areas of the sacrum Top with dry dressing, secure with tape Change daily  Silicone foam to the dorsal right foot/toe wounds. Change every 3 days and PRN.   LALM in place while in the ICU for moisture management and pressure redistribution Prevalon boots to offload heels in high risk patient  General surgery to manage surgical I&D wounds. Unless WOC is re-consulted.  Re consult if needed, will not follow at this time. Thanks  Joanne [name] [employer], RN,CWOCN, CNS, [place] [phone])

## 2023-01-17 NOTE — Transfer of Care (Signed)
Immediate Anesthesia Transfer of Care Note  Patient: Joanne [name]  Procedure(s) Performed: DEBRIDEMENT Fournier gangrene (Perineum)  Patient Location: ICU  Anesthesia Type:General  Level of Consciousness: sedated  Airway & Oxygen Therapy: Patient remains intubated per anesthesia plan and Patient placed on Ventilator (see vital sign flow sheet for setting)  Post-op Assessment: Report given to RN and Post -op Vital signs reviewed and stable  Post vital signs: Reviewed and stable  Last Vitals:  Vitals Value Taken Time  BP (Aline) 129/59 01/17/23 0442  Temp    Pulse 68 01/17/23 0448  Resp 15 01/17/23 0448  SpO2 96 % 01/17/23 0448  Vitals shown include unvalidated device data.  Last Pain:  Vitals:   01/17/23 0034  TempSrc: Axillary         Complications:  Encounter Notable Events  Notable Event Outcome Phase Comment  Difficult to intubate - expected  Intraprocedure Filed from anesthesia note documentation.

## 2023-01-17 NOTE — Significant Event (Signed)
Given CT findings, case discussed with Dr. Lysle Pearl, will proceed per his advice after his review.

## 2023-01-17 NOTE — Anesthesia Procedure Notes (Signed)
Procedure Name: Intubation Date/Time: 01/17/2023 3:17 AM  Performed by: Hedda Slade, CRNAPre-anesthesia Checklist: Patient identified, Patient being monitored, Timeout performed, Emergency Drugs available and Suction available Patient Re-evaluated:Patient Re-evaluated prior to induction Oxygen Delivery Method: Circle system utilized Preoxygenation: Pre-oxygenation with 100% oxygen Induction Type: IV induction and Rapid sequence Laryngoscope Size: 3 and McGraph Grade View: Grade I Tube type: Oral Tube size: 6.5 mm Number of attempts: 1 Airway Equipment and Method: Stylet and Video-laryngoscopy Placement Confirmation: ETT inserted through vocal cords under direct vision, positive ETCO2 and breath sounds checked- equal and bilateral Secured at: 21 cm Tube secured with: Tape Dental Injury: Teeth and Oropharynx as per pre-operative assessment  Difficulty Due To: Difficulty was anticipated and Difficult Airway- due to reduced neck mobility Future Recommendations: Recommend- induction with short-acting agent, and alternative techniques readily available

## 2023-01-17 NOTE — Plan of Care (Signed)
GOC consult noted. Patient is unable to participate in [place] conversations. No family at bedside. Per [place] note under ACP tab, patient's aunt, Reginold Agent is her legal guardian. Attempted to reach her unsuccessfully at number provided. Will reattempt tomorrow.

## 2023-01-17 NOTE — Anesthesia Preprocedure Evaluation (Signed)
Anesthesia Evaluation  Patient identified by MRN, date of birth, ID band Patient confused    Reviewed: Allergy & Precautions, Patient's Chart, lab work & pertinent test results  History of Anesthesia Complications Negative for: history of anesthetic complications  Airway Mallampati: Trach  TM Distance: <3 FB   Mouth opening: Limited Mouth Opening  Dental  (+) Poor Dentition, Dental Advidsory Given   Pulmonary    breath sounds clear to auscultation       Cardiovascular hypertension,  Rhythm:Regular Rate:Normal - Systolic murmurs    Neuro/Psych Seizures -,   Neuromuscular disease  negative psych ROS   GI/Hepatic   Endo/Other  diabetesHypothyroidism    Renal/GU Renal disease     Musculoskeletal   Abdominal   Peds  Hematology   Anesthesia Other Findings Patient is difficult to understand and there is no legal guardian available. Patient states she recently ate but I was unable to determine what she ate or when she ate it. Patient stated that she was in a lot of pain.   Past Medical History: No date: Acute kidney injury (AKI) with acute tubular necrosis (ATN)  ([place]) 06/06/2018: Acute on chronic combined systolic and diastolic CHF  (congestive heart failure) ([place]) 06/06/2018: Acute on chronic respiratory failure with hypoxia ([place]) 06/06/2018: ARDS (adult respiratory distress syndrome) (HCC) No date: Aspiration pneumonia due to gastric secretions ([facility]) No date: Diabetes mellitus ([place]) No date: History of traumatic brain injury No date: Hypothyroidism 06/06/2018: Pneumonia due to Klebsiella pneumoniae [facility]) No date: Seizure disorder ([place]) 06/06/2018: Severe sepsis ([place]) 06/06/2018: Tracheostomy status ([place]) No date: Traumatic brain injury ([place])  Reproductive/Obstetrics                             Anesthesia Physical Anesthesia Plan  ASA: 4  Anesthesia Plan: General ETT   Post-op Pain  Management:    Induction: Intravenous  PONV Risk Score and Plan: 3 and Ondansetron, Dexamethasone and Treatment may vary due to age or medical condition  Airway Management Planned: Oral ETT  Additional Equipment: None  Intra-op Plan:   Post-operative Plan: Extubation in OR  Informed Consent: I have reviewed the patients History and Physical, chart, labs and discussed the procedure including the risks, benefits and alternatives for the proposed anesthesia with the patient or authorized representative who has indicated his/her understanding and acceptance.     Dental Advisory Given and History available from chart only  Plan Discussed with: Anesthesiologist, CRNA and Surgeon  Anesthesia Plan Comments: (Patient's legal guardian is unreachable. Surgeon has declared the case an emergency and will proceed without informed consent from the legal guardian.)        Anesthesia Quick Evaluation

## 2023-01-17 NOTE — Op Note (Signed)
Preoperative diagnosis: Fournier's gangrene Postoperative diagnosis: Suprapubic abscess  Procedure: Incision and drainage of suprapubic abscess  Anesthesia: GETA  Surgeon: Lysle Pearl  Wound Classification: Contaminated  Indications: Patient is a 52 y.o. female  presented with above.  See H&P for further details.  Specimen: Suprapubic abscess cultures  Complications: None  Estimated Blood Loss: 10 mL  Findings:  1.  Suprapubic abscess with no evidence of necrotizing fasciitis 2. purulent secretions drained and cultured 3. Adequate hemostasis.   Description of procedure: The patient was placed in the supine position and GETA anesthesia was induced. The area was prepped and draped in the usual sterile fashion. A timeout was completed verifying correct patient, procedure, site, positioning, and implant(s) and/or special equipment prior to beginning this procedure.  Local infused over planned incision site.  Horizontal suprapubic Inicision made and purulent secretions immediately drained.  Approximately 150 mL cultures taken.  With a hemostat blunt dissection of septas performed to drain the abscess completely.  Inspection of the wound noted a softball sized abscess cavity within the suprapubic area with no evidence of necrotizing fasciitis or involvement of fascia.  The wound then irrigated, hemostasis confirmed and then packed with an iodine infused Kerlix roll, dressed with abd pads and secured with paper tape.   The patient tolerated the procedure well and was taken directly to ICU intubated due to hypotension issues and need for pressors preop and IntraOp, as well as plan for second look to ensure no worsening of the severe infection in the area in a couple days.  Will likely proceed with a wound VAC placement at that time as well.

## 2023-01-17 NOTE — Consult Note (Signed)
NAME:  Joanne [name], MRN:  [id number], DOB:  [birth year], LOS: 1 ADMISSION DATE:  01/16/2023, CONSULTATION DATE:  01/17/23 REFERRING MD:  K.Foust, NP, CHIEF COMPLAINT:  Code sepsis   History of Present Illness:  52 year old female presenting to [facility] ED from [facility] via EMS on 01/16/2023 for evaluation of altered mental status and concern for sepsis. History provided per chart review. EMS initially called out for multiple electrolyte derangements, SNF staff was also concerned she might be septic.  They reported her potassium was elevated at 6.7 and sodium 154. patient had a chest x-ray done on 01/15/2023 outpatient which revealed pneumonia and she was started on Rocephin.  She also has a PICC line in place as well as a chronic indwelling catheter.  At baseline the patient is bed bound & nonverbal with some chronic contractures in place and a legal guardian.  ED course: Upon arrival patient febrile, tachypneic and tachycardic with marginal blood pressure.  Sepsis protocol initiated patient received IV fluid resuscitation as well as empiric antibiotics.  Her Foley catheter was exchanged and TRH consulted for admission.  Labs significant for hypernatremia, hyperkalemia, elevated chloride and BUN, Transaminitis, hypoalbuminemia, significant leukocytosis with left shift and mild anemia.  Chest x-ray concerning for atelectasis versus pneumonia and CT abdomen and pelvis concerning for Fournier's gangrene and surgery was urgently consulted.  Patient went directly to the OR for intervention.  Medications given: Acetaminophen, 1 L LR bolus, azithromycin/cefepime/ vancomycin, IV contrast.  She got an additional liter of fluid in the OR. Initial Vitals: 102.5, 40, 115, 98/56 and 94% on room air Significant labs: (Labs/ Imaging personally reviewed) I, Domingo Pulse [place], AGACNP-BC, personally viewed and interpreted this ECG. EKG Interpretation: Date: 01/16/2023, EKG Time: 22: 18, Rate: 120, Rhythm:  ST, QRS Axis: Borderline RAD, Intervals: Abnormal lateral Q waves, ST/T Wave abnormalities: None, Narrative Interpretation: Sinus tachycardia with abnormal lateral Q waves Chemistry: Na+: 153, K+: 5.2, BUN/Cr.:  55/0.75, Serum CO2/ AG: 23/5, alk phos: 133, albumin: 1.9 Hematology: WBC: 34.2, Hgb: 10.8,  Lactic/ PCT: 1.6/pending, COVID-19 & Influenza A/B: Negative  ABG: 7.38/34/129/20.1 CXR 01/16/2023: Patchy opacities in the right lower lung atelectasis versus infection.  Stable cardiomegaly CT CT abdomen pelvis with contrast 01/17/2023: Extensive soft tissue infiltration, soft tissue gas and loculated collections in the low anterior pelvic fat extending into the genital region consistent with Fournier's gas gangrene.  Delayed appearance of contrast material in the renal collecting systems may indicate metabolic disease.  No evidence of obstruction.  Broad-based ventral abdominal wall hernia containing small and large bowel without proximal obstruction.  During surgery it was discovered the patient did not have Fournier's gas gangrene but a suprapubic abscess which was debrided and packed.  The patient was a difficult intubation due to contractures.  Plan to return to the OR on Friday, 01/19/2023.  PCCM consulted for assistance in management and monitoring due to postoperative mechanical ventilatory support in the setting of suspected sepsis secondary to suprapubic abscess in the setting of possible CAP.  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  Post surgical intervention patient requiring mechanical ventilation and transferred to ICU. Interim History  / Subjective:  Patient intubated and sedated, unable to  participate in interview at this time  Objective   Blood pressure 93/63, pulse (!) 103, temperature 99.2 F (37.3 C), temperature source Axillary, resp. rate (!) 38, weight 70 kg, SpO2 97 %.        Intake/Output Summary (Last 24 hours) at 01/17/2023 0358 Last data filed at 01/17/2023 0043 Gross per 24 hour  Intake 2250 ml  Output --  Net 2250 ml   Filed Weights   01/16/23 2218  Weight: 70 kg    Examination: General: Adult female, critically ill, lying in bed intubated & sedated requiring mechanical ventilation, NAD HEENT: MM pale/dry, anicteric, atraumatic, neck supple Neuro: RASS -5 (patient paralyzed postsurgery-nonverbal at baseline) unable to follow commands, PERRL +3 , MAE CV: s1s2 RRR, NSR on monitor, no r/m/g Pulm: Regular, non labored on PRVC 40% and PEEP of 5, breath sounds coarse/ rhonchi-BUL & diminished-BLL GI: soft, rounded with surgical incision and packing in place, bs x 4 GU: foley in place with clear yellow urine Skin: Significant scar tissue on posterior gluteal region and sacrum from chronic pressure injuries.  Open sacral wounds and abrasions to right contracted foot   Extremities: warm/dry, pulses + 2 R/P, trace edema noted  Resolved Hospital Problem list     Assessment & Plan:  Suspected Sepsis secondary to suprapubic abscess in the setting of possible CAP- present on admission Lactic: 1.6, Baseline PCT: pending, UA: Pending  Initial interventions/workup included: 2 L of NS/LR & azithromycin/ Cefepime/ Vancomycin - Supplemental oxygen as needed, to maintain SpO2 > 90% - f/u cultures, trend lactic/ PCT - Daily CBC, monitor WBC/ fever curve - continue IV antibiotics: azithromycin, cefepime & vancomycin  - IVF hydration as needed - Continue vasopressors to maintain MAP< 65: norepinephrine - Strict I/O's: alert provider if UOP < 0.5 mL/kg/hr - general surgery following, with plans to return to OR  on Fri. 01/19/23, appreciate input  Post Operative Ventilator Management - Ventilator settings: PRVC  8 mL/kg, 40% FiO2, 5 PEEP, continue ventilator support & lung protective strategies - Wean PEEP & FiO2 as tolerated, maintain SpO2 > 90% - Head of bed elevated 30 degrees, VAP protocol in place - Plateau pressures less than 30 cm H20  - Intermittent chest x-ray & ABG PRN - Daily WUA with SBT as tolerated  - Ensure adequate pulmonary hygiene  - Bronchodilators PRN - PAD protocol in place: continue Fentanyl drip & Propofol drip  Hypernatremia Mild hyperkalemia -Daily BMP, replace electrolytes as needed -Continue D5 LR infusion  Chronic Seizure Disorder PMHx: TBI - adjusted carbamazepine XR to 50 mg Q 6 h per OGT - neuro checks Q 4  Hypothyroidism - continue outpatient Synthroid  Chronic contractures 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 - continue Baclofen - WOC consulted - Turn Q 2 h, ROM Q 4, utilize offloading devices  Best Practice (right click and "Reselect all SmartList Selections" daily)  Diet/type: NPO w/ meds via tube DVT prophylaxis: LMWH GI prophylaxis: H2B Lines: Central line, Arterial Line, and yes and it is still needed Foley:  Yes, and it is still needed Code Status:  full code Last date of multidisciplinary goals of care discussion [N/A]  Labs   CBC: Recent Labs  Lab 01/16/23 2233  WBC 34.2*  NEUTROABS 30.7*  HGB 10.8*  HCT 34.8*  MCV 88.5  PLT 0000000    Basic Metabolic Panel: Recent Labs  Lab 01/16/23 2233  NA 153*  K 5.2*  CL 125*  CO2 23  GLUCOSE 140*  BUN 55*  CREATININE 0.75  CALCIUM 8.0*   GFR: Estimated Creatinine Clearance: 77 mL/min (by C-G formula based on SCr of 0.75 mg/dL). Recent Labs  Lab 01/16/23 2233  WBC 34.2*  LATICACIDVEN 1.6    Liver Function Tests: Recent Labs  Lab 01/16/23 2233  AST 33  ALT 20  ALKPHOS 133*  BILITOT 1.1  PROT 8.1  ALBUMIN 1.9*   No results for input(s): "LIPASE", "AMYLASE"  in the last 168 hours. No results for input(s): "AMMONIA" in the last 168 hours.  ABG    Component Value Date/Time   PHART 7.32 (L) 05/20/2020 0549   PCO2ART 29 (L) 05/20/2020 0549   PO2ART 339 (H) 05/20/2020 0549   HCO3 25.5 07/04/2021 1458   ACIDBASEDEF 1.6 07/04/2021 1458   O2SAT 74.8 07/04/2021 1458     Coagulation Profile: Recent Labs  Lab 01/16/23 2233  INR 1.5*    Cardiac Enzymes: No results for input(s): "CKTOTAL", "CKMB", "CKMBINDEX", "TROPONINI" in the last 168 hours.  HbA1C: Hgb A1c MFr Bld  Date/Time Value Ref Range Status  07/07/2021 03:16 AM 5.7 (H) 4.8 - 5.6 % Final    Comment:    (NOTE) Pre diabetes:          5.7%-6.4%  Diabetes:              >6.4%  Glycemic control for   <7.0% adults with diabetes   05/21/2020 05:32 AM 6.1 (H) 4.8 - 5.6 % Final    Comment:    (NOTE) Pre diabetes:          5.7%-6.4%  Diabetes:              >6.4%  Glycemic control for   <7.0% adults with diabetes     CBG: No results for input(s): "GLUCAP" in the last 168 hours.  Review of Systems:   UTA patient intubated and sedated unable to participate in interview at this time  Past Medical History:  She,  has a past medical history of Acute kidney injury (AKI) with acute tubular necrosis (ATN) ([place]), Acute on chronic combined systolic and diastolic CHF (congestive heart failure) ([place]) (06/06/2018), Acute on chronic respiratory failure with hypoxia ([place]) (06/06/2018), ARDS (adult respiratory distress syndrome) ([place]) (06/06/2018), Aspiration pneumonia due to gastric secretions ([place]), Diabetes mellitus ([place]), History of traumatic brain injury, Hypothyroidism, Pneumonia due to Klebsiella pneumoniae ([place]) (06/06/2018), Seizure disorder ([place]), Severe sepsis ([place]) (06/06/2018), Tracheostomy status ([place]) (06/06/2018), and Traumatic brain injury ([place]).   Surgical History:   Past Surgical History:  Procedure Laterality Date   IR [facility] GASTRO/COLONIC TUBE PERCUT W/FLUORO  07/11/2021    PEG PLACEMENT N/A 05/27/2020   Procedure: PERCUTANEOUS ENDOSCOPIC GASTROSTOMY (PEG) PLACEMENT;  Surgeon: Lucilla Lame, MD;  Location: [facility] ENDOSCOPY;  Service: Endoscopy;  Laterality: N/A;   PERCUTANEOUS ENDOSCOPIC GASTROSTOMY (PEG) REMOVAL N/A 08/29/2022   Procedure: PERCUTANEOUS ENDOSCOPIC GASTROSTOMY (PEG) REMOVAL;  Surgeon: Lucilla Lame, MD;  Location: [facility] ENDOSCOPY;  Service: Endoscopy;  Laterality: N/A;   TRACHEOSTOMY       Social History:   reports that she has never smoked. She has never used smokeless tobacco. She reports that she does not currently use alcohol. She reports that she does not currently use drugs.   Family History:  Her Family history is unknown by patient.   Allergies Allergies  Allergen Reactions   Kaopectate  [Attapulgite] Itching   Thiopental Itching   Gatifloxacin     Other reaction(s): Unknown   Ceftin [Cefuroxime]     Tolerates cefepime, ceftriaxone, ampicillin/sulbactam, piperacillin/tazobactam   Tomato  Home Medications  Prior to Admission medications   Medication Sig Start Date End Date Taking? Authorizing Provider  acetaminophen (TYLENOL) 325 MG tablet Take 650 mg by mouth every 8 (eight) hours as needed for mild pain or moderate pain.   Yes [provider]  albuterol (PROVENTIL) (2.5 MG/3ML) 0.083% nebulizer solution Take 3 mLs (2.5 mg total) by nebulization every 4 (four) hours as needed for wheezing or shortness of breath. 07/12/21  Yes Bonnielee Haff, MD  ascorbic acid (VITAMIN C) 500 MG tablet Take 1,000 mg by mouth daily.   Yes [provider]  baclofen (LIORESAL) 10 MG tablet Take 10 mg by mouth 3 (three) times daily. 07/19/20  Yes [provider]  carbamazepine (TEGRETOL XR) 100 MG 12 hr tablet Take 100 mg by mouth 2 (two) times daily.   Yes [provider]  cefTRIAXone (ROCEPHIN) IVPB 2 g once.   Yes [provider]  Cranberry 450 MG CAPS Take 450 mg by mouth daily.   Yes [provider]  docusate (COLACE) 50 MG/5ML liquid Place 10 mLs (100 mg total) into feeding tube 2 (two) times daily. Patient taking differently: Take 100 mg by mouth 2 (two) times daily. 07/12/21  Yes Bonnielee Haff, MD  doxycycline 100 mg in sodium chloride 0.9 % 250 mL Inject 100 mg into the vein every 12 (twelve) hours.   Yes [provider]  ertapenem (INVANZ) IVPB 1 g daily. 01/15/23 01/23/23 Yes [provider]  ferrous sulfate 325 (65 FE) MG tablet Take 325 mg by mouth every other day.   Yes [provider]  gabapentin (NEURONTIN) 100 MG capsule Take 100 mg by mouth 2 (two) times daily.   Yes [provider]  lactobacillus acidophilus & bulgar (LACTINEX) chewable tablet Chew 1 tablet by mouth 2 (two) times daily.   Yes [provider]  levothyroxine (SYNTHROID) 112 MCG tablet Take 1 tablet (112 mcg total) by mouth daily before breakfast. 07/12/21  Yes Bonnielee Haff, MD  oxyCODONE-acetaminophen (PERCOCET/ROXICET) 5-325 MG tablet Place 1 tablet into feeding tube 3 (three) times daily. Patient taking differently: Take 1 tablet by mouth 3 (three) times daily. 07/12/21  Yes Bonnielee Haff, MD  polyethylene glycol (MIRALAX / GLYCOLAX) 17 g packet Take 17 g by mouth 2 (two) times daily. 07/12/21  Yes Bonnielee Haff, MD  senna (SENOKOT) 8.6 MG TABS tablet Take 2 tablets by mouth 2 (two) times daily.   Yes [provider]  sertraline (ZOLOFT) 25 MG tablet Take 25 mg by mouth daily.   Yes [provider]  sodium chloride 0.45 % solution Inject 70 mLs into the vein continuous. 70 ml per hour for 2 liters   Yes [provider]  Water For Irrigation, Sterile (FREE WATER) SOLN Place 75 mLs into feeding tube every 4 (four) hours. 07/12/21  Yes Bonnielee Haff, MD  bisacodyl (FLEET) 10 MG/30ML ENEM Place 10 mg rectally once.    [provider]  midodrine (PROAMATINE) 10 MG tablet Place 1 tablet (10 mg total) into feeding tube 3 (three) times  daily as needed (if SBP < 100). 04/30/21   Val Riles, MD  sodium polystyrene (KAYEXALATE) 15 GM/60ML suspension Take 60 g by mouth as needed.    [provider]     Critical care time: 68 minutes       Venetia Night, AGACNP-BC Acute Care Nurse Practitioner [facility]   [phone] / [phone] Please see Amion for pager details.

## 2023-01-17 NOTE — Progress Notes (Signed)
BBrandi RN

## 2023-01-17 NOTE — Progress Notes (Signed)
Surgery into see patient. Patient having brown, yellow, pink drainage from incision. Per MD reinforce dressing. Dr. Mortimer Fries notified of patients troponin levels and electrolytes during morning rounds. At this time no additional orders. Replaced potassium, troponin levels-MD believes is due to demand ischemia. Palliative care consult. Continue to assess.

## 2023-01-17 NOTE — Anesthesia Procedure Notes (Signed)
Arterial Line Insertion Start/End3/27/2024 3:40 AM, 01/17/2023 3:41 AM Performed by: Dimas Millin, MD  Patient location: Pre-op. Preanesthetic checklist: patient identified, IV checked, site marked, risks and benefits discussed, surgical consent, monitors and equipment checked, pre-op evaluation, timeout performed and anesthesia consent Lidocaine 1% used for infiltration Right, brachial was placed Catheter size: 20 G Hand hygiene performed  and maximum sterile barriers used   Attempts: 1 Procedure performed using ultrasound guided technique. Following insertion, dressing applied. Post procedure assessment: normal and unchanged

## 2023-01-17 NOTE — ED Notes (Signed)
Critical CT reading of pelvic, given to Beather Arbour MD and relayed to [facility] MD

## 2023-01-17 NOTE — Progress Notes (Signed)
eLink Physician-Brief Progress Note Patient Name: Joanne [name] DOB: May 21, 1971 MRN: [id number]   Date of Service  01/17/2023  HPI/Events of Note  Patient admitted with septic shock, altered mental status, abscess, pneumonia, and post-operative respiratory failure.  eICU Interventions  New Patient Evaluation.        Joanne [name] U Imaad Reuss 01/17/2023, 6:00 AM

## 2023-01-17 NOTE — Progress Notes (Signed)
Pharmacy Antibiotic Note  Joanne [name] is a 52 y.o. female admitted on 01/16/2023 with sepsis.  Pharmacy has been consulted for Vancomycin, Cefepime  dosing.  Plan: Cefepime 2 gm IV X 1 given in ED on 3/26 @ 2307. Cefepime 2 gm IV Q8H ordered to continue on 3/27 @ 0700.  Vancomycin 1750 mg IV X 1 ordered for 3/27 @ ~ 0100 followed by Vancomycin 1750 mg IV Q24H.   AUC = 508.3 Vanc trough = 9.6   Weight: 70 kg (154 lb 5.2 oz)  Temp (24hrs), Avg:102.5 F (39.2 C), Min:102.5 F (39.2 C), Max:102.5 F (39.2 C)  Recent Labs  Lab 01/16/23 2233  WBC 34.2*  CREATININE 0.75  LATICACIDVEN 1.6    Estimated Creatinine Clearance: 77 mL/min (by C-G formula based on SCr of 0.75 mg/dL).    Allergies  Allergen Reactions   Kaopectate  [Attapulgite] Itching   Thiopental Itching   Gatifloxacin     Other reaction(s): Unknown   Ceftin [Cefuroxime]     Tolerates cefepime, ceftriaxone, ampicillin/sulbactam, piperacillin/tazobactam   Tomato     Antimicrobials this admission:   >>    >>   Dose adjustments this admission:   Microbiology results:  BCx:   UCx:    Sputum:    MRSA PCR:   Thank you for allowing pharmacy to be a part of this patient's care.  Kanda Deluna D 01/17/2023 12:32 AM

## 2023-01-17 NOTE — Significant Event (Signed)
Given CT scan finding of infection in abd wall and presentation with severe sepsis, I discussed with Dr Lysle Pearl. I agree that patient has emergent indication for OR exploration/debridement of affected area. Delay will imperil patient life more.

## 2023-01-18 ENCOUNTER — Encounter: Payer: Self-pay | Admitting: Surgery

## 2023-01-18 DIAGNOSIS — J9601 Acute respiratory failure with hypoxia: Secondary | ICD-10-CM | POA: Diagnosis not present

## 2023-01-18 DIAGNOSIS — A419 Sepsis, unspecified organism: Secondary | ICD-10-CM | POA: Diagnosis not present

## 2023-01-18 DIAGNOSIS — Z7189 Other specified counseling: Secondary | ICD-10-CM | POA: Diagnosis not present

## 2023-01-18 LAB — CBC
HCT: 25.7 % — ABNORMAL LOW (ref 36.0–46.0)
Hemoglobin: 7.4 g/dL — ABNORMAL LOW (ref 12.0–15.0)
MCH: 26.5 pg (ref 26.0–34.0)
MCHC: 28.8 g/dL — ABNORMAL LOW (ref 30.0–36.0)
MCV: 92.1 fL (ref 80.0–100.0)
Platelets: 223 10*3/uL (ref 150–400)
RBC: 2.79 MIL/uL — ABNORMAL LOW (ref 3.87–5.11)
RDW: 14.5 % (ref 11.5–15.5)
WBC: 20.7 10*3/uL — ABNORMAL HIGH (ref 4.0–10.5)
nRBC: 0 % (ref 0.0–0.2)

## 2023-01-18 LAB — RESPIRATORY PANEL BY PCR

## 2023-01-18 LAB — URINE CULTURE: Culture: NO GROWTH

## 2023-01-18 LAB — PHOSPHORUS: Phosphorus: 3.8 mg/dL (ref 2.5–4.6)

## 2023-01-18 LAB — HEMOGLOBIN AND HEMATOCRIT, BLOOD
HCT: 24.6 % — ABNORMAL LOW (ref 36.0–46.0)
Hemoglobin: 7.3 g/dL — ABNORMAL LOW (ref 12.0–15.0)

## 2023-01-18 LAB — GLUCOSE, CAPILLARY
Glucose-Capillary: 109 mg/dL — ABNORMAL HIGH (ref 70–99)
Glucose-Capillary: 118 mg/dL — ABNORMAL HIGH (ref 70–99)
Glucose-Capillary: 142 mg/dL — ABNORMAL HIGH (ref 70–99)
Glucose-Capillary: 154 mg/dL — ABNORMAL HIGH (ref 70–99)
Glucose-Capillary: 155 mg/dL — ABNORMAL HIGH (ref 70–99)
Glucose-Capillary: 96 mg/dL (ref 70–99)

## 2023-01-18 LAB — HEPATIC FUNCTION PANEL
ALT: 15 U/L (ref 0–44)
AST: 26 U/L (ref 15–41)
Albumin: 3.3 g/dL — ABNORMAL LOW (ref 3.5–5.0)
Alkaline Phosphatase: 85 U/L (ref 38–126)
Bilirubin, Direct: 0.2 mg/dL (ref 0.0–0.2)
Indirect Bilirubin: 0.5 mg/dL (ref 0.3–0.9)
Total Bilirubin: 0.7 mg/dL (ref 0.3–1.2)
Total Protein: 7.5 g/dL (ref 6.5–8.1)

## 2023-01-18 LAB — BASIC METABOLIC PANEL
Anion gap: 8 (ref 5–15)
BUN: 38 mg/dL — ABNORMAL HIGH (ref 6–20)
CO2: 22 mmol/L (ref 22–32)
Calcium: 8.3 mg/dL — ABNORMAL LOW (ref 8.9–10.3)
Chloride: 122 mmol/L — ABNORMAL HIGH (ref 98–111)
Creatinine, Ser: 0.76 mg/dL (ref 0.44–1.00)
GFR, Estimated: >60 mL/min (ref >60)
Glucose, Bld: 134 mg/dL — ABNORMAL HIGH (ref 70–99)
Potassium: 3.5 mmol/L (ref 3.5–5.1)
Sodium: 152 mmol/L — ABNORMAL HIGH (ref 135–145)

## 2023-01-18 LAB — HEMOGLOBIN A1C
Hgb A1c MFr Bld: 6.9 % — ABNORMAL HIGH (ref 4.8–5.6)
Mean Plasma Glucose: 151 mg/dL

## 2023-01-18 LAB — MAGNESIUM: Magnesium: 2 mg/dL (ref 1.7–2.4)

## 2023-01-18 LAB — VITAMIN B12: Vitamin B-12: 1196 pg/mL — ABNORMAL HIGH (ref 180–914)

## 2023-01-18 MED ORDER — ACETAMINOPHEN 650 MG RE SUPP: 650.0000 mg | Freq: Four times a day (QID) | RECTAL | Status: DC | PRN

## 2023-01-18 MED ORDER — JUVEN PO PACK
1.0000 | PACK | Freq: Two times a day (BID) | ORAL | Status: DC
  Administered 2023-01-18: 1

## 2023-01-18 MED ORDER — ACETAMINOPHEN 325 MG PO TABS
650.0000 mg | ORAL_TABLET | Freq: Four times a day (QID) | ORAL | Status: DC | PRN
  Administered 2023-01-24 – 2023-02-09 (×10): 650 mg
  Filled 2023-01-18 (×10): qty 2

## 2023-01-18 NOTE — Consult Note (Signed)
Consultation Note Date: 01/18/2023   Patient Name: Joanne [name]  DOB: 28-Apr-1971  MRN: [id number]  Age / Sex: 52 y.o., female  PCP: Townsend [name], MD Referring Physician: Flora Lipps, MD  Reason for Consultation: Establishing goals of care  HPI/Patient Profile: 52 year old female presenting to [facility] ED from [facility] via EMS on 01/16/2023 for evaluation of altered mental status and concern for sepsis. History provided per chart review. EMS initially called out for multiple electrolyte derangements, SNF staff was also concerned she might be septic.  They reported her potassium was elevated at 6.7 and sodium 154. patient had a chest x-ray done on 01/15/2023 outpatient which revealed pneumonia and she was started on Rocephin.  She also has a PICC line in place as well as a chronic indwelling catheter.  At baseline the patient is bed bound & nonverbal with some chronic contractures in place and a legal guardian.  Clinical Assessment and Goals of Care: Notes and labs reviewed.  Patient has been evaluated by PMT during past admissions in 2021 and 2022.  At that time she stated that she wanted God to end her suffering, and prayed for her suffering to be ended, but desired any and all care possible as long as she was living, and wanted to try to live as long as possible.  Patient is currently resting in the bed with no family at bedside.  She is on the ventilator and unable to speak with me.  Called to speak with patient's H POA.  H POA is patient's aunt [name].  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  She states her niece had a wreck when she was a teenager.  She states her mother had been her guardian, but her mother died.  She states there were other relatives but nobody wanted to "step up" to be patient's legal guardian.  She states she became legal guardian.  She states  because of the circumstances she has relied on patient and medical team to advise on patient's wishes and how to proceed forward with care.  Discussed her current situation.  She states all she can do is rely on previous conversations with patient where she stated she wanted to do anything and everything to live as long as possible.  She states in regards to CPR, she cannot state not to do this, because if CPR would restart her heart it would enable her to live longer.    Discussed that in 2021 and 2022 patient was able to speak to [place] with Passy-Muir valve.  If patient returns to a place of being able to discuss goals of care, discussed that we would discuss goals of care with her.  SUMMARY OF RECOMMENDATIONS   Code/full scope  Prognosis:  Poor overall       Primary Diagnoses: Present on Admission:  Abdominal pain  Acute metabolic encephalopathy  Severe sepsis with acute organ dysfunction (HCC)  Pneumonia  Hypernatremia  Hyperkalemia   I have reviewed the  medical record, interviewed the patient and family, and examined the patient. The following aspects are pertinent.  Past Medical History:  Diagnosis Date   Acute kidney injury (AKI) with acute tubular necrosis (ATN) (HCC)    Acute on chronic combined systolic and diastolic CHF (congestive heart failure) ([place]) 06/06/2018   Acute on chronic respiratory failure with hypoxia ([place]) 06/06/2018   ARDS (adult respiratory distress syndrome) ([place]) 06/06/2018   Aspiration pneumonia due to gastric secretions (HCC)    Diabetes mellitus ([place])    History of traumatic brain injury    Hypothyroidism    Pneumonia due to Klebsiella pneumoniae ([place]) 06/06/2018   Seizure disorder ([place])    Severe sepsis ([place]) 06/06/2018   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   Traumatic brain injury ([place])    Social History   Socioeconomic History   Marital status: Single    Spouse name: Not on file   Number of children: Not on file   Years of education: Not on  file   Highest education level: Not on file  Occupational History   Not on file  Tobacco Use   Smoking status: Never   Smokeless tobacco: Never  Substance and Sexual Activity   Alcohol use: Not Currently   Drug use: Not Currently   Sexual activity: Not Currently  Other Topics Concern   Not on file  Social History Narrative   Not on file   Social Determinants of Health   Financial Resource Strain: Not on file  Food Insecurity: Not on file  Transportation Needs: Not on file  Physical Activity: Not on file  Stress: Not on file  Social Connections: Not on file   Family History  Family history unknown: Yes   Scheduled Meds:  albuterol  2.5 mg Nebulization Once   baclofen  10 mg Per Tube TID   carbamazepine  50 mg Per Tube Q6H   Chlorhexidine Gluconate Cloth  6 each Topical Daily   docusate  100 mg Per Tube BID   famotidine  20 mg Per Tube BID   fentaNYL (SUBLIMAZE) injection  50 mcg Intravenous Once   free water  200 mL Per Tube Q4H   insulin aspart  0-9 Units Subcutaneous Q4H   levothyroxine  112 mcg Per Tube Q0600   nutrition supplement (JUVEN)  1 packet Per Tube BID BM   mouth rinse  15 mL Mouth Rinse Q2H   sertraline  25 mg Per Tube Daily   sodium chloride flush  3 mL Intravenous Q12H   Continuous Infusions:  feeding supplement (VITAL AF 1.2 CAL) 30 mL/hr at 01/18/23 1210   fentaNYL infusion INTRAVENOUS 150 mcg/hr (01/18/23 1312)   linezolid (ZYVOX) IV Stopped (01/18/23 1020)   norepinephrine (LEVOPHED) Adult infusion 1 mcg/min (01/17/23 1856)   piperacillin-tazobactam (ZOSYN)  IV 3.375 g (01/18/23 1306)   PRN Meds:.acetaminophen **OR** acetaminophen, fentaNYL, midazolam, mouth rinse Medications Prior to Admission:  Prior to Admission medications   Medication Sig Start Date End Date Taking? Authorizing Provider  acetaminophen (TYLENOL) 325 MG tablet Take 650 mg by mouth every 8 (eight) hours as needed for mild pain or moderate pain.   Yes [provider]   albuterol (PROVENTIL) (2.5 MG/3ML) 0.083% nebulizer solution Take 3 mLs (2.5 mg total) by nebulization every 4 (four) hours as needed for wheezing or shortness of breath. 07/12/21  Yes Bonnielee Haff, MD  ascorbic acid (VITAMIN C) 500 MG tablet Take 1,000 mg by mouth daily.   Yes [provider]  baclofen (LIORESAL) 10  MG tablet Take 10 mg by mouth 3 (three) times daily. 07/19/20  Yes [provider]  carbamazepine (TEGRETOL XR) 100 MG 12 hr tablet Take 100 mg by mouth 2 (two) times daily.   Yes [provider]  cefTRIAXone (ROCEPHIN) IVPB 2 g once.   Yes [provider]  Cranberry 450 MG CAPS Take 450 mg by mouth daily.   Yes [provider]  docusate (COLACE) 50 MG/5ML liquid Place 10 mLs (100 mg total) into feeding tube 2 (two) times daily. Patient taking differently: Take 100 mg by mouth 2 (two) times daily. 07/12/21  Yes Bonnielee Haff, MD  doxycycline 100 mg in sodium chloride 0.9 % 250 mL Inject 100 mg into the vein every 12 (twelve) hours.   Yes [provider]  ertapenem (INVANZ) IVPB 1 g daily. 01/15/23 01/23/23 Yes [provider]  ferrous sulfate 325 (65 FE) MG tablet Take 325 mg by mouth every other day.   Yes [provider]  gabapentin (NEURONTIN) 100 MG capsule Take 100 mg by mouth 2 (two) times daily.   Yes [provider]  lactobacillus acidophilus & bulgar (LACTINEX) chewable tablet Chew 1 tablet by mouth 2 (two) times daily.   Yes [provider]  levothyroxine (SYNTHROID) 112 MCG tablet Take 1 tablet (112 mcg total) by mouth daily before breakfast. 07/12/21  Yes Bonnielee Haff, MD  oxyCODONE-acetaminophen (PERCOCET/ROXICET) 5-325 MG tablet Place 1 tablet into feeding tube 3 (three) times daily. Patient taking differently: Take 1 tablet by mouth 3 (three) times daily. 07/12/21  Yes Bonnielee Haff, MD  polyethylene glycol (MIRALAX / GLYCOLAX) 17 g packet Take 17 g by mouth 2 (two) times daily.  07/12/21  Yes Bonnielee Haff, MD  senna (SENOKOT) 8.6 MG TABS tablet Take 2 tablets by mouth 2 (two) times daily.   Yes [provider]  sertraline (ZOLOFT) 25 MG tablet Take 25 mg by mouth daily.   Yes [provider]  sodium chloride 0.45 % solution Inject 70 mLs into the vein continuous. 70 ml per hour for 2 liters   Yes [provider]  Water For Irrigation, Sterile (FREE WATER) SOLN Place 75 mLs into feeding tube every 4 (four) hours. 07/12/21  Yes Bonnielee Haff, MD  bisacodyl (FLEET) 10 MG/30ML ENEM Place 10 mg rectally once.    [provider]  midodrine (PROAMATINE) 10 MG tablet Place 1 tablet (10 mg total) into feeding tube 3 (three) times daily as needed (if SBP < 100). 04/30/21   Val Riles, MD  sodium polystyrene (KAYEXALATE) 15 GM/60ML suspension Take 60 g by mouth as needed.    [provider]   Allergies  Allergen Reactions   Kaopectate  [Attapulgite] Itching   Thiopental Itching   Gatifloxacin     Other reaction(s): Unknown   Ceftin [Cefuroxime]     Tolerates cefepime, ceftriaxone, ampicillin/sulbactam, piperacillin/tazobactam   Tomato    Review of Systems  Unable to perform ROS   Physical Exam Pulmonary:     Comments: On ventilator  Neurological:     Mental Status: She is alert.     Vital Signs: BP (!) 99/59   Pulse 86   Temp 97.7 F (36.5 C) (Axillary)   Resp 15   Ht 5\' 2"  (1.575 m)   Wt 70.3 kg   LMP  (LMP Unknown)   SpO2 96%   BMI 28.35 kg/m  Pain Scale: CPOT       SpO2: SpO2: 96 % O2 Device:SpO2: 96 %  O2 Flow Rate: .   IO: Intake/output summary:  Intake/Output Summary (Last 24 hours) at 01/18/2023 1513 Last data filed at 01/18/2023 1210 Gross per 24 hour  Intake 2361.34 ml  Output 450 ml  Net 1911.34 ml    LBM: Last BM Date : 01/17/23 Baseline Weight: Weight: 70 kg Most recent weight: Weight: 70.3 kg       Signed by: Asencion Gowda, NP   Please contact Palliative Medicine Team phone  at [phone] for questions and concerns.  For individual provider: See Shea Evans

## 2023-01-18 NOTE — H&P (View-Only) (Signed)
Subjective:  CC: Joanne [name] is a 51 y.o. female  Hospital stay day 2, 1 Day Post-Op suprapubic abscess incision and drainage  HPI: No acute issues reported overnight.  ROS:  Unable to obtain secondary to patient's status   Objective:   Temp:  [97.7 F (36.5 C)-97.9 F (36.6 C)] 97.9 F (36.6 C) (03/28 0800) Pulse Rate:  [69-93] 86 (03/28 1100) Resp:  [13-19] 13 (03/28 1100) BP: (78-122)/(52-76) 115/69 (03/28 1100) SpO2:  [95 %-100 %] 96 % (03/28 1100) FiO2 (%):  [30 %] 30 % (03/28 1050) Weight:  [70.3 kg] 70.3 kg (03/28 0702)     Height: 5' 2" (157.5 cm) Weight: 70.3 kg BMI (Calculated): 28.34   Intake/Output this shift:   Intake/Output Summary (Last 24 hours) at 01/18/2023 1126 Last data filed at 01/18/2023 0831 Gross per 24 hour  Intake 2499.58 ml  Output 500 ml  Net 1999.58 ml    Constitutional :  Intubated and sedated  Respiratory:  clear to auscultation bilaterally  Cardiovascular:  regular rate and rhythm  Gastrointestinal: soft, non-tender; bowel sounds normal; no masses,  no organomegaly.   Skin: Cool and moist.  Suprapubic dressing clean dry and intact  Psychiatric: Normal affect, non-agitated, not confused       LABS:     Latest Ref Rng & Units 01/18/2023    5:30 AM 01/17/2023    5:21 AM 01/16/2023   10:33 PM  CMP  Glucose 70 - 99 mg/dL 134  184    187  140   BUN 6 - 20 mg/dL 38  44    47  55   Creatinine 0.44 - 1.00 mg/dL 0.76  0.70    0.68  0.75   Sodium 135 - 145 mmol/L 152  155    155  153   Potassium 3.5 - 5.1 mmol/L 3.5  3.1    3.1  5.2   Chloride 98 - 111 mmol/L 122  121    121  125   CO2 22 - 32 mmol/L 22  20    20  23   Calcium 8.9 - 10.3 mg/dL 8.3  7.9    7.9  8.0   Total Protein 6.5 - 8.1 g/dL 7.5  6.9  8.1   Total Bilirubin 0.3 - 1.2 mg/dL 0.7  1.2  1.1   Alkaline Phos 38 - 126 U/L 85  113  133   AST 15 - 41 U/L 26  26  33   ALT 0 - 44 U/L 15  18  20       Latest Ref Rng & Units 01/18/2023   10:32 AM 01/18/2023    5:30 AM  01/17/2023    5:21 AM  CBC  WBC 4.0 - 10.5 K/uL  20.7  40.9   Hemoglobin 12.0 - 15.0 g/dL 7.3  7.4  9.2   Hematocrit 36.0 - 46.0 % 24.6  25.7  31.1   Platelets 150 - 400 K/uL  223  289     RADS: N/a Assessment:   Status post suprapubic abscess drainage.  Septic shock requiring pressor support and ventilation  Continue care per ICU team.  Hemoglobin drop noted but no overt signs of active bleeding from I&D site.  Will continue to monitor for now.  Plan for second look and likely wound VAC placement tomorrow morning  labs/images/medications/previous chart entries reviewed personally and relevant changes/updates noted above.   

## 2023-01-18 NOTE — Anesthesia Postprocedure Evaluation (Signed)
Anesthesia Post Note  Patient: Joanne [name]  Procedure(s) Performed: DEBRIDEMENT Fournier gangrene (Perineum)  Patient location during evaluation: SICU Anesthesia Type: General Level of consciousness: sedated Pain management: pain level controlled Vital Signs Assessment: post-procedure vital signs reviewed and stable Respiratory status: patient remains intubated per anesthesia plan and patient on ventilator - see flowsheet for VS Cardiovascular status: stable Postop Assessment: no apparent nausea or vomiting Anesthetic complications: yes   Encounter Notable Events  Notable Event Outcome Phase Comment  Difficult to intubate - expected  Intraprocedure Filed from anesthesia note documentation.     Last Vitals:  Vitals:   01/18/23 0600 01/18/23 0700  BP: (!) 101/56 108/65  Pulse: 81 74  Resp: 15 15  Temp:    SpO2: 95% 97%    Last Pain:  Vitals:   01/18/23 0400  TempSrc: Axillary                 Addam Goeller Lorenza Chick

## 2023-01-18 NOTE — Progress Notes (Signed)
NAME:  Joanne [name], MRN:  [id number], DOB:  22-Dec-1970, LOS: 2 ADMISSION DATE:  01/16/2023, CONSULTATION DATE:  01/17/23 REFERRING MD:  K.Foust, NP, CHIEF COMPLAINT:  Code sepsis   History of Present Illness:  52 year old female presenting to [facility] ED from [facility] via EMS on 01/16/2023 for evaluation of altered mental status and concern for sepsis. History provided per chart review. EMS initially called out for multiple electrolyte derangements, SNF staff was also concerned she might be septic.  They reported her potassium was elevated at 6.7 and sodium 154. patient had a chest x-ray done on 01/15/2023 outpatient which revealed pneumonia and she was started on Rocephin.  She also has a PICC line in place as well as a chronic indwelling catheter.  At baseline the patient is bed bound & nonverbal with some chronic contractures in place and a legal guardian.  ED course: Upon arrival patient febrile, tachypneic and tachycardic with marginal blood pressure.  Sepsis protocol initiated patient received IV fluid resuscitation as well as empiric antibiotics.  Her Foley catheter was exchanged and TRH consulted for admission.  Labs significant for hypernatremia, hyperkalemia, elevated chloride and BUN, Transaminitis, hypoalbuminemia, significant leukocytosis with left shift and mild anemia.  Chest x-ray concerning for atelectasis versus pneumonia and CT abdomen and pelvis concerning for Fournier's gangrene and surgery was urgently consulted.  Patient went directly to the OR for intervention.   ABG: 7.38/34/129/20.1 CXR 01/16/2023: Patchy opacities in the right lower lung atelectasis versus infection.  Stable cardiomegaly CT CT abdomen pelvis with contrast 01/17/2023: Extensive soft tissue infiltration, soft tissue gas and loculated collections in the low anterior pelvic fat extending into the genital region consistent with Fournier's gas gangrene.  Delayed appearance of contrast material in the renal  collecting systems may indicate metabolic disease.  No evidence of obstruction.  Broad-based ventral abdominal wall hernia containing small and large bowel without proximal obstruction.  During surgery it was discovered the patient did not have Fournier's gas gangrene but a suprapubic abscess which was debrided and packed.  The patient was a difficult intubation due to contractures.  Plan to return to the OR on Friday, 01/19/2023.  PCCM consulted for assistance in management and monitoring due to postoperative mechanical ventilatory support in the setting of suspected sepsis secondary to suprapubic abscess in the setting of possible CAP.  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  Post surgical intervention patient requiring mechanical ventilation and transferred to ICU. 3/27 remains on vent 3/28 remains on vent     Interim History / Subjective:  Patient remains on vent Severe sepsis Plan for going back to OR tomorrow  Vent Mode: PRVC FiO2 (%):  [30 %] 30 % Set Rate:  [15 bmp] 15 bmp Vt Set:  [450 mL] 450 mL PEEP:  [5 cmH20] 5 cmH20 Plateau Pressure:  [26 cmH20-27 cmH20] 27 cmH20   Objective   Blood pressure (!) 101/56, pulse 81, temperature 97.7 F (36.5 C), temperature source Axillary, resp. rate 15, height 5\' 2"  (1.575 m), weight 68.2 kg, SpO2 95 %.    Vent Mode: PRVC FiO2 (%):  [30 %] 30 % Set Rate:  [15 bmp] 15 bmp Vt Set:  [450 mL]  450 mL PEEP:  [5 cmH20] 5 cmH20 Plateau Pressure:  [26 cmH20-27 cmH20] 27 cmH20   Intake/Output Summary (Last 24 hours) at 01/18/2023 0708 Last data filed at 01/18/2023 0640 Gross per 24 hour   Intake 3487.36 ml  Output 725 ml  Net 2762.36 ml    Filed Weights   01/16/23 2218 01/17/23 0449  Weight: 70 kg 68.2 kg     REVIEW OF SYSTEMS  PATIENT IS UNABLE TO PROVIDE COMPLETE REVIEW OF SYSTEMS DUE TO SEVERE CRITICAL ILLNESS   PHYSICAL EXAMINATION:  GENERAL:critically ill appearing, EYES: Pupils equal, round, reactive to light.  No scleral icterus.  MOUTH: Moist mucosal membrane.  NECK: Supple.  PULMONARY: Lungs clear to auscultation, +rhonchi, +wheezing CARDIOVASCULAR: S1 and S2.  Regular rate and rhythm GASTROINTESTINAL: Soft, nontender, -distended. Positive bowel sounds.  MUSCULOSKELETAL: No swelling, clubbing, or edema.  NEUROLOGIC: obtunded,sedated Skin: Significant scar tissue on posterior gluteal region and sacrum from chronic pressure injuries.  Open sacral wounds and abrasions to right contracted foot    Resolved Hospital Problem list     Assessment & Plan:  Suspected Sepsis secondary to suprapubic abscess in the setting of possible CAP- present on admission POST OP RESP FAILURE DUE TO SEVERE SEPSIS  Severe ACUTE Hypoxic and Hypercapnic Respiratory Failure POST OP FAILURE DUE TO SEVERE SEPSIS -continue Mechanical Ventilator support -Wean Fio2 and PEEP as tolerated -VAP/VENT bundle implementation - Wean PEEP & FiO2 as tolerated, maintain SpO2 > 88% - Head of bed elevated 30 degrees, VAP protocol in place - Plateau pressures less than 30 cm H20  - Intermittent chest x-ray & ABG PRN - Ensure adequate pulmonary hygiene  -will perform SAT/SBT when respiratory parameters are met  SEVERE SEPSIS SOURCE-SUPRAPUBIC ABSCESS -use vasopressors to keep MAP>65 as needed -follow ABG and LA as needed -follow up cultures -emperic ABX -aggressive IV fluid Resuscitation   Hypernatremia Mild hyperkalemia -Daily BMP, replace electrolytes as needed -Continue D5 LR infusion   NEUROLOGY ACUTE METABOLIC ENCEPHALOPATHY Chronic Seizure Disorder PMHx: TBI -  adjusted carbamazepine XR to 50 mg Q 6 h per OGT - neuro checks Q 4  Hypothyroidism - continue outpatient Synthroid  Chronic contractures 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 - continue Baclofen - WOC consulted - Turn Q 2 h, ROM Q 4, utilize offloading devices   ENDO - ICU hypoglycemic\Hyperglycemia protocol -check FSBS per protocol   GI GI PROPHYLAXIS as indicated  NUTRITIONAL STATUS DIET-->TF's as tolerated Constipation protocol as indicated   ELECTROLYTES -follow labs as needed -replace as needed -pharmacy consultation and following     Best Practice (right click and "Reselect all SmartList Selections" daily)  Diet/type: NPO w/ meds via tube DVT prophylaxis: LMWH GI prophylaxis: H2B Lines: Central line, Arterial Line, and yes and it is still needed Foley:  Yes, and it is still needed Code Status:  full code   Labs   CBC: Recent Labs  Lab 01/16/23 2233 01/17/23 0521 01/18/23 0530  WBC 34.2* 40.9* 20.7*  NEUTROABS 30.7*  --   --   HGB 10.8* 9.2* 7.4*  HCT 34.8* 31.1* 25.7*  MCV 88.5 89.6 92.1  PLT 318 289 223     Basic Metabolic Panel: Recent Labs  Lab 01/16/23 2233 01/17/23 0521 01/18/23 0530  NA 153* 155*  155* 152*  K 5.2* 3.1*  3.1* 3.5  CL 125* 121*  121* 122*  CO2 23 20*  20* 22  GLUCOSE 140* 184*  187* 134*  BUN 55* 44*  47* 38*  CREATININE 0.75 0.70  0.68 0.76  CALCIUM 8.0* 7.9*  7.9* 8.3*  MG  --  1.9 2.0  PHOS  --  3.0 3.8    GFR: Estimated Creatinine Clearance: 74.4 mL/min (by C-G formula based on SCr of 0.76 mg/dL). Recent Labs  Lab 01/16/23 2233 01/17/23 0521 01/18/23 0530  PROCALCITON  --  1.96  --   WBC 34.2* 40.9* 20.7*  LATICACIDVEN 1.6 1.3  --      Liver Function Tests: Recent Labs  Lab 01/16/23 2233 01/17/23 0521 01/18/23 0530  AST 33 26 26  ALT 20 18 15   ALKPHOS 133* 113 85  BILITOT 1.1 1.2 0.7  PROT 8.1 6.9 7.5  ALBUMIN 1.9* 1.6* 3.3*    No results for input(s): "LIPASE",  "AMYLASE" in the last 168 hours. No results for input(s): "AMMONIA" in the last 168 hours.  ABG    Component Value Date/Time   PHART 7.38 01/17/2023 0445   PCO2ART 34 01/17/2023 0445   PO2ART 129 (H) 01/17/2023 0445   HCO3 20.1 01/17/2023 0445   ACIDBASEDEF 4.2 (H) 01/17/2023 0445   O2SAT 99.2 01/17/2023 0445     Coagulation Profile: Recent Labs  Lab 01/16/23 2233 01/17/23 0521  INR 1.5* 1.6*     Cardiac Enzymes: Recent Labs  Lab 01/17/23 0521  CKTOTAL 75    HbA1C: Hgb A1c MFr Bld  Date/Time Value Ref Range Status  01/17/2023 05:21 AM 6.9 (H) 4.8 - 5.6 % Final    Comment:    (NOTE)         Prediabetes: 5.7 - 6.4         Diabetes: >6.4         Glycemic control for adults with diabetes: <7.0   07/07/2021 03:16 AM 5.7 (H) 4.8 - 5.6 % Final    Comment:    (NOTE) Pre diabetes:          5.7%-6.4%  Diabetes:              >6.4%  Glycemic control for   <7.0% adults with diabetes     CBG: Recent Labs  Lab 01/17/23 1209 01/17/23 1608 01/17/23 1927 01/17/23 2351 01/18/23 0353  GLUCAP 215* 144* 93 105* 155*      DVT/GI PRX  assessed I Assessed the need for Labs I Assessed the need for Foley I Assessed the need for Central Venous Line Family Discussion when available I Assessed the need for Mobilization I made an Assessment of medications to be adjusted accordingly Safety Risk assessment completed  CASE DISCUSSED IN MULTIDISCIPLINARY ROUNDS WITH ICU TEAM     Critical Care Time devoted to patient care services described in this note is 48 minutes.  Critical care was necessary to treat /prevent imminent and life-threatening deterioration. Overall, patient is critically ill, prognosis is guarded.  Patient with Multiorgan failure and at high risk for cardiac arrest and death.    Corrin Parker, M.D.  [facility]  Medical Director [place] Director [facility] Cardio-Pulmonary Department

## 2023-01-18 NOTE — Progress Notes (Signed)
Subjective:  CC: Joanne [name] is a 52 y.o. female  Hospital stay day 2, 1 Day Post-Op suprapubic abscess incision and drainage  HPI: No acute issues reported overnight.  ROS:  Unable to obtain secondary to patient's status   Objective:   Temp:  [97.7 F (36.5 C)-97.9 F (36.6 C)] 97.9 F (36.6 C) (03/28 0800) Pulse Rate:  [69-93] 86 (03/28 1100) Resp:  [13-19] 13 (03/28 1100) BP: (78-122)/(52-76) 115/69 (03/28 1100) SpO2:  [95 %-100 %] 96 % (03/28 1100) FiO2 (%):  [30 %] 30 % (03/28 1050) Weight:  [70.3 kg] 70.3 kg (03/28 0702)     Height: 5\' 2"  (157.5 cm) Weight: 70.3 kg BMI (Calculated): 28.34   Intake/Output this shift:   Intake/Output Summary (Last 24 hours) at 01/18/2023 1126 Last data filed at 01/18/2023 0831 Gross per 24 hour  Intake 2499.58 ml  Output 500 ml  Net 1999.58 ml    Constitutional :  Intubated and sedated  Respiratory:  clear to auscultation bilaterally  Cardiovascular:  regular rate and rhythm  Gastrointestinal: soft, non-tender; bowel sounds normal; no masses,  no organomegaly.   Skin: Cool and moist.  Suprapubic dressing clean dry and intact  Psychiatric: Normal affect, non-agitated, not confused       LABS:     Latest Ref Rng & Units 01/18/2023    5:30 AM 01/17/2023    5:21 AM 01/16/2023   10:33 PM  CMP  Glucose 70 - 99 mg/dL 134  184    187  140   BUN 6 - 20 mg/dL 38  44    47  55   Creatinine 0.44 - 1.00 mg/dL 0.76  0.70    0.68  0.75   Sodium 135 - 145 mmol/L 152  155    155  153   Potassium 3.5 - 5.1 mmol/L 3.5  3.1    3.1  5.2   Chloride 98 - 111 mmol/L 122  121    121  125   CO2 22 - 32 mmol/L 22  20    20  23    Calcium 8.9 - 10.3 mg/dL 8.3  7.9    7.9  8.0   Total Protein 6.5 - 8.1 g/dL 7.5  6.9  8.1   Total Bilirubin 0.3 - 1.2 mg/dL 0.7  1.2  1.1   Alkaline Phos 38 - 126 U/L 85  113  133   AST 15 - 41 U/L 26  26  33   ALT 0 - 44 U/L 15  18  20        Latest Ref Rng & Units 01/18/2023   10:32 AM 01/18/2023    5:30 AM  01/17/2023    5:21 AM  CBC  WBC 4.0 - 10.5 K/uL  20.7  40.9   Hemoglobin 12.0 - 15.0 g/dL 7.3  7.4  9.2   Hematocrit 36.0 - 46.0 % 24.6  25.7  31.1   Platelets 150 - 400 K/uL  223  289     RADS: N/a Assessment:   Status post suprapubic abscess drainage.  Septic shock requiring pressor support and ventilation  Continue care per ICU team.  Hemoglobin drop noted but no overt signs of active bleeding from I&D site.  Will continue to monitor for now.  Plan for second look and likely wound VAC placement tomorrow morning  labs/images/medications/previous chart entries reviewed personally and relevant changes/updates noted above.

## 2023-01-19 ENCOUNTER — Inpatient Hospital Stay: Payer: Medicare Other | Admitting: Anesthesiology

## 2023-01-19 ENCOUNTER — Encounter: Payer: Self-pay | Admitting: Surgery

## 2023-01-19 ENCOUNTER — Other Ambulatory Visit: Payer: Self-pay

## 2023-01-19 ENCOUNTER — Encounter
Admission: EM | Disposition: A | Discharge status: Skilled Nursing Facility | Payer: Self-pay | Source: Skilled Nursing Facility | Attending: Internal Medicine

## 2023-01-19 DIAGNOSIS — R6521 Severe sepsis with septic shock: Secondary | ICD-10-CM | POA: Diagnosis not present

## 2023-01-19 DIAGNOSIS — A419 Sepsis, unspecified organism: Secondary | ICD-10-CM | POA: Diagnosis not present

## 2023-01-19 DIAGNOSIS — J189 Pneumonia, unspecified organism: Secondary | ICD-10-CM

## 2023-01-19 HISTORY — PX: WOUND EXPLORATION: SHX6188

## 2023-01-19 HISTORY — PX: APPLICATION OF WOUND VAC: SHX5189

## 2023-01-19 LAB — CBC
HCT: 24.1 % — ABNORMAL LOW (ref 36.0–46.0)
HCT: 25.9 % — ABNORMAL LOW (ref 36.0–46.0)
Hemoglobin: 7.2 g/dL — ABNORMAL LOW (ref 12.0–15.0)
Hemoglobin: 7.8 g/dL — ABNORMAL LOW (ref 12.0–15.0)
MCH: 27.5 pg (ref 26.0–34.0)
MCH: 27.1 pg (ref 26.0–34.0)
MCHC: 29.9 g/dL — ABNORMAL LOW (ref 30.0–36.0)
MCHC: 30.1 g/dL (ref 30.0–36.0)
MCV: 92 fL (ref 80.0–100.0)
MCV: 89.9 fL (ref 80.0–100.0)
Platelets: 224 10*3/uL (ref 150–400)
Platelets: 243 10*3/uL (ref 150–400)
RBC: 2.62 MIL/uL — ABNORMAL LOW (ref 3.87–5.11)
RBC: 2.88 MIL/uL — ABNORMAL LOW (ref 3.87–5.11)
RDW: 14.8 % (ref 11.5–15.5)
RDW: 14.7 % (ref 11.5–15.5)
WBC: 16.6 10*3/uL — ABNORMAL HIGH (ref 4.0–10.5)
WBC: 18.1 10*3/uL — ABNORMAL HIGH (ref 4.0–10.5)
nRBC: 0 % (ref 0.0–0.2)
nRBC: 0 % (ref 0.0–0.2)

## 2023-01-19 LAB — GLUCOSE, CAPILLARY
Glucose-Capillary: 72 mg/dL (ref 70–99)
Glucose-Capillary: 73 mg/dL (ref 70–99)
Glucose-Capillary: 92 mg/dL (ref 70–99)
Glucose-Capillary: 84 mg/dL (ref 70–99)
Glucose-Capillary: 110 mg/dL — ABNORMAL HIGH (ref 70–99)

## 2023-01-19 LAB — TYPE AND SCREEN
ABO/RH(D): A POS
Antibody Screen: NEGATIVE

## 2023-01-19 LAB — BASIC METABOLIC PANEL
Anion gap: 9 (ref 5–15)
BUN: 38 mg/dL — ABNORMAL HIGH (ref 6–20)
CO2: 21 mmol/L — ABNORMAL LOW (ref 22–32)
Calcium: 8.1 mg/dL — ABNORMAL LOW (ref 8.9–10.3)
Chloride: 114 mmol/L — ABNORMAL HIGH (ref 98–111)
Creatinine, Ser: 0.7 mg/dL (ref 0.44–1.00)
GFR, Estimated: >60 mL/min (ref >60)
Glucose, Bld: 126 mg/dL — ABNORMAL HIGH (ref 70–99)
Potassium: 3.3 mmol/L — ABNORMAL LOW (ref 3.5–5.1)
Sodium: 145 mmol/L (ref 135–145)

## 2023-01-19 LAB — PHOSPHORUS: Phosphorus: 4.3 mg/dL (ref 2.5–4.6)

## 2023-01-19 LAB — MAGNESIUM: Magnesium: 1.9 mg/dL (ref 1.7–2.4)

## 2023-01-19 SURGERY — WOUND EXPLORATION
Anesthesia: General | Site: Abdomen

## 2023-01-19 MED ORDER — PROPOFOL 1000 MG/100ML IV EMUL
INTRAVENOUS | Status: AC
  Filled 2023-01-19: qty 100

## 2023-01-19 MED ORDER — ONDANSETRON HCL 4 MG/2ML IJ SOLN
INTRAMUSCULAR | Status: AC
  Filled 2023-01-19: qty 2

## 2023-01-19 MED ORDER — BUPIVACAINE HCL (PF) 0.5 % IJ SOLN
INTRAMUSCULAR | Status: DC | PRN
  Administered 2023-01-19: 20 mL

## 2023-01-19 MED ORDER — JUVEN PO PACK
1.0000 | PACK | Freq: Two times a day (BID) | ORAL | Status: AC
  Administered 2023-01-19 – 2023-01-29 (×20): 1

## 2023-01-19 MED ORDER — SODIUM CHLORIDE 0.9 % IV SOLN: INTRAVENOUS | Status: DC | PRN

## 2023-01-19 MED ORDER — CARBAMAZEPINE 100 MG/5ML PO SUSP
50.0000 mg | Freq: Four times a day (QID) | ORAL | Status: DC
  Administered 2023-01-19 – 2023-02-09 (×83): 50 mg
  Filled 2023-01-19 (×92): qty 2.5

## 2023-01-19 MED ORDER — ROCURONIUM BROMIDE 100 MG/10ML IV SOLN
INTRAVENOUS | Status: DC | PRN
  Administered 2023-01-19: 20 mg via INTRAVENOUS

## 2023-01-19 MED ORDER — PROPOFOL 10 MG/ML IV BOLUS
INTRAVENOUS | Status: AC
  Filled 2023-01-19: qty 20

## 2023-01-19 MED ORDER — POTASSIUM CHLORIDE 20 MEQ PO PACK
40.0000 meq | PACK | ORAL | Status: AC
  Administered 2023-01-19 (×2): 40 meq
  Filled 2023-01-19 (×2): qty 2

## 2023-01-19 MED ORDER — PHENYLEPHRINE HCL (PRESSORS) 10 MG/ML IV SOLN
INTRAVENOUS | Status: DC | PRN
  Administered 2023-01-19: 160 ug via INTRAVENOUS

## 2023-01-19 MED ORDER — FENTANYL 2500MCG IN NS 250ML (10MCG/ML) PREMIX INFUSION
INTRAVENOUS | Status: AC
  Filled 2023-01-19: qty 250

## 2023-01-19 MED ORDER — BUPIVACAINE HCL (PF) 0.5 % IJ SOLN
INTRAMUSCULAR | Status: AC
  Filled 2023-01-19: qty 30

## 2023-01-19 MED ORDER — VITAL AF 1.2 CAL PO LIQD
1000.0000 mL | ORAL | Status: DC
  Administered 2023-01-19 – 2023-01-23 (×2): 1000 mL

## 2023-01-19 MED ORDER — ONDANSETRON HCL 4 MG/2ML IJ SOLN
INTRAMUSCULAR | Status: DC | PRN
  Administered 2023-01-19: 4 mg via INTRAVENOUS

## 2023-01-19 MED ORDER — 0.9 % SODIUM CHLORIDE (POUR BTL) OPTIME
TOPICAL | Status: DC | PRN
  Administered 2023-01-19: 400 mL

## 2023-01-19 MED ORDER — ROCURONIUM BROMIDE 10 MG/ML (PF) SYRINGE
PREFILLED_SYRINGE | INTRAVENOUS | Status: AC
  Filled 2023-01-19: qty 10

## 2023-01-19 MED ORDER — PROPOFOL 10 MG/ML IV BOLUS
INTRAVENOUS | Status: DC | PRN
  Administered 2023-01-19: 50 mg via INTRAVENOUS

## 2023-01-19 MED ORDER — FREE WATER
30.0000 mL | Status: DC
  Administered 2023-01-19 – 2023-01-24 (×30): 30 mL

## 2023-01-19 MED ORDER — ETOMIDATE 2 MG/ML IV SOLN
INTRAVENOUS | Status: AC
  Filled 2023-01-19: qty 10

## 2023-01-19 SURGICAL SUPPLY — 24 items
CANISTER WOUND CARE 500ML ATS (WOUND CARE) ×1 IMPLANT
DRAPE LAPAROTOMY 100X77 ABD (DRAPES) ×1 IMPLANT
DRSG VAC GRANUFOAM MED (GAUZE/BANDAGES/DRESSINGS) ×1 IMPLANT
ELECT REM PT RETURN 9FT ADLT (ELECTROSURGICAL) ×1
ELECTRODE REM PT RTRN 9FT ADLT (ELECTROSURGICAL) ×1 IMPLANT
GAUZE SPONGE 4X4 12PLY STRL (GAUZE/BANDAGES/DRESSINGS) IMPLANT
GLOVE BIOGEL PI IND STRL 6.5 (GLOVE) ×1 IMPLANT
GLOVE BIOGEL PI IND STRL 7.0 (GLOVE) ×1 IMPLANT
GLOVE SURG SYN 6.5 ES PF (GLOVE) ×1 IMPLANT
GLOVE SURG SYN 6.5 PF PI (GLOVE) ×1 IMPLANT
GOWN STRL REUS W/ TWL LRG LVL3 (GOWN DISPOSABLE) ×2 IMPLANT
GOWN STRL REUS W/ TWL XL LVL3 (GOWN DISPOSABLE) ×1 IMPLANT
GOWN STRL REUS W/TWL LRG LVL3 (GOWN DISPOSABLE) ×2
GOWN STRL REUS W/TWL XL LVL3 (GOWN DISPOSABLE) ×1
HANDLE YANKAUER SUCT OPEN TIP (MISCELLANEOUS) IMPLANT
KIT TURNOVER KIT A (KITS) ×1 IMPLANT
LABEL OR SOLS (LABEL) ×1 IMPLANT
MANIFOLD NEPTUNE II (INSTRUMENTS) ×1 IMPLANT
NS IRRIG 500ML POUR BTL (IV SOLUTION) ×1 IMPLANT
PACK BASIN MINOR ARMC (MISCELLANEOUS) ×1 IMPLANT
SOL PREP PVP 2OZ (MISCELLANEOUS) ×2
SOLUTION PREP PVP 2OZ (MISCELLANEOUS) ×1 IMPLANT
SPONGE T-LAP 18X18 ~~LOC~~+RFID (SPONGE) ×1 IMPLANT
TRAP FLUID SMOKE EVACUATOR (MISCELLANEOUS) ×1 IMPLANT

## 2023-01-19 NOTE — Op Note (Signed)
Pre-Op Dx: Suprapubic wound Post-Op Dx: Same Anesthesia: GETA EBL: Minimal Complications:  none apparent Specimen: None Procedure: Wound exploration, wound VAC placement for suprapubic former abscess site   Surgeon: Lysle Pearl  Indications for procedure: See H&P  Description of Procedure:   Preop wound measurement: 10x 6cm x 8cm deep Postop wound measurement: same Amount of tissue removed: none Depth of wound: 8cm Consent obtained, time out performed.  Patient placed in supine position.  Area sterilized and draped in usual position. Kerlex roll removed and wound inspected.  No additional issues and viable tissue noted throughout the entire wound.    Wound hemostasis noted, black DME wound VAC sponge cut to shape to fill in entire wound, sealed with airtight seal and placed on 125 mmHg continuous suction.   pt tolerated procedure well, and transferred to PACU in stable condition. Sponge and instrument count correct at end of procedure.  Next wound VAC change on Monday at bedside.

## 2023-01-19 NOTE — Anesthesia Postprocedure Evaluation (Signed)
Anesthesia Post Note  Patient: Joanne [name]  Procedure(s) Performed: WOUND EXPLORATION (Abdomen) APPLICATION OF WOUND VAC (Abdomen)  Patient location during evaluation: ICU Anesthesia Type: General Level of consciousness: sedated and patient remains intubated per anesthesia plan Pain management: pain level controlled Respiratory status: patient remains intubated per anesthesia plan and patient on ventilator - see flowsheet for VS Cardiovascular status: blood pressure returned to baseline Anesthetic complications: no   No notable events documented.   Last Vitals:  Vitals:   01/19/23 1000 01/19/23 1146  BP: 126/71   Pulse: 74   Resp: 15   Temp:    SpO2: 92% 93%    Last Pain:  Vitals:   01/19/23 0935  TempSrc: Axillary                 Darrin Nipper

## 2023-01-19 NOTE — Progress Notes (Signed)
NAME:  Joanne [name], MRN:  [id number], DOB:  [birth year], LOS: 3 ADMISSION DATE:  01/16/2023, CONSULTATION DATE:  01/17/23 REFERRING MD:  K.Foust, NP, CHIEF COMPLAINT:  Code sepsis   History of Present Illness:  52 year old female presenting to [facility] ED from [facility] via EMS on 01/16/2023 for evaluation of altered mental status and concern for sepsis. History provided per chart review. EMS initially called out for multiple electrolyte derangements, SNF staff was also concerned she might be septic.  They reported her potassium was elevated at 6.7 and sodium 154. patient had a chest x-ray done on 01/15/2023 outpatient which revealed pneumonia and she was started on Rocephin.  She also has a PICC line in place as well as a chronic indwelling catheter.  At baseline the patient is bed bound & nonverbal with some chronic contractures in place and a legal guardian.  ED course: Upon arrival patient febrile, tachypneic and tachycardic with marginal blood pressure.  Sepsis protocol initiated patient received IV fluid resuscitation as well as empiric antibiotics.  Her Foley catheter was exchanged and TRH consulted for admission.  Labs significant for hypernatremia, hyperkalemia, elevated chloride and BUN, Transaminitis, hypoalbuminemia, significant leukocytosis with left shift and mild anemia.  Chest x-ray concerning for atelectasis versus pneumonia and CT abdomen and pelvis concerning for Fournier's gangrene and surgery was urgently consulted.  Patient went directly to the OR for intervention.   ABG: 7.38/34/129/20.1 CXR 01/16/2023: Patchy opacities in the right lower lung atelectasis versus infection.  Stable cardiomegaly CT CT abdomen pelvis with contrast 01/17/2023: Extensive soft tissue infiltration, soft tissue gas and loculated collections in the low anterior pelvic fat extending into the genital region consistent with Fournier's gas gangrene.  Delayed appearance of contrast material in the renal  collecting systems may indicate metabolic disease.  No evidence of obstruction.  Broad-based ventral abdominal wall hernia containing small and large bowel without proximal obstruction.  During surgery it was discovered the patient did not have Fournier's gas gangrene but a suprapubic abscess which was debrided and packed.  The patient was a difficult intubation due to contractures.  Plan to return to the OR on Friday, 01/19/2023.  PCCM consulted for assistance in management and monitoring due to postoperative mechanical ventilatory support in the setting of suspected sepsis secondary to suprapubic abscess in the setting of possible CAP.  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  Post surgical intervention patient requiring mechanical ventilation and transferred to ICU. 3/27 remains on vent 3/28 remains on vent 3/29: wound vac change   Interim History / Subjective:  Patient remains on vent OR today for wound exploration and wound vac exchange  Vent Mode: PRVC FiO2 (%):  [30 %] 30 % Set Rate:  [15 bmp] 15 bmp Vt Set:  [450 mL] 450 mL PEEP:  [5 cmH20] 5 cmH20 Plateau Pressure:  [29 cmH20-32 cmH20] 32 cmH20   Objective   Blood pressure 138/68, pulse 77, temperature (!) 97.3 F (36.3 C), temperature source Axillary, resp. rate 15, height 5\' 2"  (1.575 m), weight 70 kg, SpO2 90 %.    Vent Mode: PRVC FiO2 (%):  [30 %] 30 % Set Rate:  [15 bmp] 15 bmp Vt Set:  [  450 mL] 450 mL PEEP:  [5 cmH20] 5 cmH20 Plateau Pressure:  [28 cmH20-32 cmH20] 32 cmH20   Intake/Output Summary (Last 24 hours) at 01/19/2023 1033 Last data filed at 01/19/2023  0834 Gross per 24 hour  Intake 1758.56 ml  Output 875 ml  Net 883.56 ml   Filed Weights   01/17/23 0449 01/18/23 0702 01/19/23 0456  Weight: 68.2 kg 70.3 kg 70 kg     REVIEW OF SYSTEMS  PATIENT IS UNABLE TO PROVIDE COMPLETE REVIEW OF SYSTEMS DUE TO SEVERE CRITICAL ILLNESS   PHYSICAL EXAMINATION:  GENERAL:critically ill appearing, EYES: Pupils equal, round, reactive to light.  No scleral icterus.  MOUTH: Moist mucosal membrane.  NECK: Supple.  PULMONARY: Lungs clear to auscultation, +rhonchi, +wheezing CARDIOVASCULAR: S1 and S2.  Regular rate and rhythm GASTROINTESTINAL: Soft, nontender, -distended. Positive bowel sounds.  MUSCULOSKELETAL: No swelling, clubbing, or edema.  NEUROLOGIC: obtunded,sedated Skin: Significant scar tissue on posterior gluteal region and sacrum from chronic pressure injuries.  Open sacral wounds and abrasions to right contracted foot    Resolved Hospital Problem list     Assessment & Plan:  Suspected Sepsis secondary to suprapubic abscess in the setting of possible CAP- present on admission POST OP RESP FAILURE DUE TO SEVERE SEPSIS  Severe ACUTE Hypoxic and Hypercapnic Respiratory Failure POST OP FAILURE DUE TO SEVERE SEPSIS -continue Mechanical Ventilator support -Wean Fio2 and PEEP as tolerated -VAP/VENT bundle implementation - Wean PEEP & FiO2 as tolerated, maintain SpO2 > 88% - Head of bed elevated 30 degrees, VAP protocol in place - Plateau pressures less than 30 cm H20  - Intermittent chest x-ray & ABG PRN - Ensure adequate pulmonary hygiene  - SAT and SBT today  SEVERE SEPSIS SOURCE-SUPRAPUBIC ABSCESS -use vasopressors to keep MAP>65 as needed -follow ABG and LA as needed -follow up cultures -empiric ABX -aggressive IV fluid Resuscitation   NEUROLOGY ACUTE METABOLIC ENCEPHALOPATHY Chronic Seizure Disorder PMHx: TBI - adjusted carbamazepine XR to 50 mg Q 6 h per OGT - neuro checks Q 4  Hypothyroidism - continue outpatient  Synthroid  Chronic contractures 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 - continue Baclofen - WOC consulted - Turn Q 2 h, ROM Q 4, utilize offloading devices   ENDO - ICU hypoglycemic\Hyperglycemia protocol -check FSBS per protocol   GI GI PROPHYLAXIS as indicated  NUTRITIONAL STATUS DIET-->TF's as tolerated Constipation protocol as indicated   ELECTROLYTES -follow labs as needed -replace as needed -pharmacy consultation and following     Best Practice (right click and "Reselect all SmartList Selections" daily)  Diet/type: NPO w/ meds via tube DVT prophylaxis: LMWH GI prophylaxis: H2B Lines: Central line, Arterial Line, and yes and it is still needed Foley:  Yes, and it is still needed Code Status:  full code   Labs   CBC: Recent Labs  Lab 01/16/23 2233 01/17/23 0521 01/18/23 0530 01/18/23 1032 01/19/23 0417  WBC 34.2* 40.9* 20.7*  --  16.6*  NEUTROABS 30.7*  --   --   --   --   HGB 10.8* 9.2* 7.4* 7.3* 7.2*  HCT 34.8* 31.1* 25.7* 24.6* 24.1*  MCV 88.5 89.6 92.1  --  92.0  PLT 318 289 223  --  [id number]    Basic Metabolic Panel: Recent Labs  Lab 01/16/23 2233 01/17/23 0521 01/18/23 0530 01/19/23 0414  NA 153* 155*  155* 152*  --   K 5.2* 3.1*  3.1* 3.5  --   CL 125* 121*  121* 122*  --   CO2  23 20*  20* 22  --   GLUCOSE 140* 184*  187* 134*  --   BUN 55* 44*  47* 38*  --   CREATININE 0.75 0.70  0.68 0.76  --   CALCIUM 8.0* 7.9*  7.9* 8.3*  --   MG  --  1.9 2.0 1.9  PHOS  --  3.0 3.8 4.3   GFR: Estimated Creatinine Clearance: 75.4 mL/min (by C-G formula based on SCr of 0.76 mg/dL). Recent Labs  Lab 01/16/23 2233 01/17/23 0521 01/18/23 0530 01/19/23 0417  PROCALCITON  --  1.96  --   --   WBC 34.2* 40.9* 20.7* 16.6*  LATICACIDVEN 1.6 1.3  --   --     Liver Function Tests: Recent Labs  Lab 01/16/23 2233 01/17/23 0521 01/18/23 0530  AST 33 26 26  ALT 20 18 15   ALKPHOS 133* 113 85  BILITOT 1.1 1.2 0.7  PROT 8.1 6.9  7.5  ALBUMIN 1.9* 1.6* 3.3*   No results for input(s): "LIPASE", "AMYLASE" in the last 168 hours. No results for input(s): "AMMONIA" in the last 168 hours.  ABG    Component Value Date/Time   PHART 7.38 01/17/2023 0445   PCO2ART 34 01/17/2023 0445   PO2ART 129 (H) 01/17/2023 0445   HCO3 20.1 01/17/2023 0445   ACIDBASEDEF 4.2 (H) 01/17/2023 0445   O2SAT 99.2 01/17/2023 0445     Coagulation Profile: Recent Labs  Lab 01/16/23 2233 01/17/23 0521  INR 1.5* 1.6*    Cardiac Enzymes: Recent Labs  Lab 01/17/23 0521  CKTOTAL 75    HbA1C: Hgb A1c MFr Bld  Date/Time Value Ref Range Status  01/17/2023 05:21 AM 6.9 (H) 4.8 - 5.6 % Final    Comment:    (NOTE)         Prediabetes: 5.7 - 6.4         Diabetes: >6.4         Glycemic control for adults with diabetes: <7.0   07/07/2021 03:16 AM 5.7 (H) 4.8 - 5.6 % Final    Comment:    (NOTE) Pre diabetes:          5.7%-6.4%  Diabetes:              >6.4%  Glycemic control for   <7.0% adults with diabetes     CBG: Recent Labs  Lab 01/18/23 1526 01/18/23 1923 01/18/23 2351 01/19/23 0418 01/19/23 0906  GLUCAP 109* 118* 142* 72 73      DVT/GI PRX  assessed I Assessed the need for Labs I Assessed the need for Foley I Assessed the need for Central Venous Line Family Discussion when available I Assessed the need for Mobilization I made an Assessment of medications to be adjusted accordingly Safety Risk assessment completed  CASE DISCUSSED IN MULTIDISCIPLINARY ROUNDS WITH ICU TEAM     Critical Care Time devoted to patient care services described in this note is 30 minutes.  Critical care was necessary to treat /prevent imminent and life-threatening deterioration. Overall, patient is critically ill, prognosis is guarded.  Patient with Multiorgan failure and at high risk for cardiac arrest and death.   Arcelia Jew MD Pulmonary & Critical Care Medicine

## 2023-01-19 NOTE — Interval H&P Note (Signed)
No change. OK to proceed. Unable to reach guardian again. Will proceed with double physician signature.

## 2023-01-19 NOTE — Progress Notes (Signed)
Bedside report received from nightshift RN. Patient responsive to voice. Unable to complete full assessment at this time as OR team here to transport patient.

## 2023-01-19 NOTE — Transfer of Care (Signed)
Immediate Anesthesia Transfer of Care Note  Patient: Joanne [name]  Procedure(s) Performed: WOUND EXPLORATION (Abdomen) APPLICATION OF WOUND VAC (Abdomen)  Patient Location: PACU and ICU  Anesthesia Type:General  Level of Consciousness: drowsy and patient cooperative  Airway & Oxygen Therapy: Patient remains intubated per anesthesia plan and Patient placed on Ventilator (see vital sign flow sheet for setting)  Post-op Assessment: Report given to RN and Post -op Vital signs reviewed and stable  Post vital signs: Reviewed and stable  Last Vitals:  Vitals Value Taken Time  BP 138/68 01/19/23 0835  Temp    Pulse 77 01/19/23 0835  Resp 17 01/19/23 0835  SpO2 100 % 01/19/23 0835  Vitals shown include unvalidated device data.  Last Pain:  Vitals:   01/18/23 1630  TempSrc: Axillary         Complications: No notable events documented.

## 2023-01-19 NOTE — Anesthesia Preprocedure Evaluation (Addendum)
Anesthesia Evaluation  Patient identified by MRN, date of birth, ID bandGeneral Assessment Comment:Patient sedated  Reviewed: Allergy & Precautions, NPO status , Patient's Chart, lab work & pertinent test results  History of Anesthesia Complications Negative for: history of anesthetic complications  Airway Mallampati: Intubated       Dental   Pulmonary pneumonia (aspiration, this admission)   Pulmonary exam normal breath sounds clear to auscultation       Cardiovascular hypertension, +CHF   Rhythm:Regular Rate:Normal  ECG 01/16/23:  Sinus tachycardia Consider right atrial enlargement Abnormal lateral Q waves   Neuro/Psych Seizures -,  Hx TBI; chronic contractures    GI/Hepatic negative GI ROS,,,  Endo/Other  diabetesHypothyroidism    Renal/GU Renal disease (AKI on CKD)     Musculoskeletal   Abdominal   Peds  Hematology  (+) Blood dyscrasia, anemia   Anesthesia Other Findings   Reproductive/Obstetrics                             Anesthesia Physical Anesthesia Plan  ASA: 4  Anesthesia Plan: General   Post-op Pain Management:    Induction: Intravenous  PONV Risk Score and Plan: 3 and Ondansetron, Dexamethasone and Treatment may vary due to age or medical condition  Airway Management Planned: Oral ETT  Additional Equipment:   Intra-op Plan:   Post-operative Plan: Post-operative intubation/ventilation  Informed Consent: I have reviewed the patients History and Physical, chart, labs and discussed the procedure including the risks, benefits and alternatives for the proposed anesthesia with the patient or authorized representative who has indicated his/her understanding and acceptance.     Dental advisory given and Consent reviewed with POA  Plan Discussed with: CRNA  Anesthesia Plan Comments: (Patient's POA Aunt [name] consented via phone for risks of anesthesia including but  not limited to:  - adverse reactions to medications - damage to eyes, teeth, lips or other oral mucosa - nerve damage due to positioning  - sore throat or hoarseness - damage to heart, brain, nerves, lungs, other parts of body or loss of life  Informed patient's aunt about role of CRNA in peri- and intra-operative care; she voiced understanding.)        Anesthesia Quick Evaluation

## 2023-01-20 DIAGNOSIS — J189 Pneumonia, unspecified organism: Secondary | ICD-10-CM | POA: Diagnosis not present

## 2023-01-20 LAB — BASIC METABOLIC PANEL
Anion gap: 9 (ref 5–15)
BUN: 33 mg/dL — ABNORMAL HIGH (ref 6–20)
CO2: 20 mmol/L — ABNORMAL LOW (ref 22–32)
Calcium: 8 mg/dL — ABNORMAL LOW (ref 8.9–10.3)
Chloride: 115 mmol/L — ABNORMAL HIGH (ref 98–111)
Creatinine, Ser: 0.6 mg/dL (ref 0.44–1.00)
GFR, Estimated: >60 mL/min (ref >60)
Glucose, Bld: 146 mg/dL — ABNORMAL HIGH (ref 70–99)
Potassium: 4.2 mmol/L (ref 3.5–5.1)
Sodium: 144 mmol/L (ref 135–145)

## 2023-01-20 LAB — CBC
HCT: 25.5 % — ABNORMAL LOW (ref 36.0–46.0)
Hemoglobin: 7.6 g/dL — ABNORMAL LOW (ref 12.0–15.0)
MCH: 27.3 pg (ref 26.0–34.0)
MCHC: 29.8 g/dL — ABNORMAL LOW (ref 30.0–36.0)
MCV: 91.7 fL (ref 80.0–100.0)
Platelets: 247 10*3/uL (ref 150–400)
RBC: 2.78 MIL/uL — ABNORMAL LOW (ref 3.87–5.11)
RDW: 14.9 % (ref 11.5–15.5)
WBC: 14.3 10*3/uL — ABNORMAL HIGH (ref 4.0–10.5)
nRBC: 0 % (ref 0.0–0.2)

## 2023-01-20 LAB — GLUCOSE, CAPILLARY
Glucose-Capillary: 112 mg/dL — ABNORMAL HIGH (ref 70–99)
Glucose-Capillary: 116 mg/dL — ABNORMAL HIGH (ref 70–99)
Glucose-Capillary: 124 mg/dL — ABNORMAL HIGH (ref 70–99)
Glucose-Capillary: 139 mg/dL — ABNORMAL HIGH (ref 70–99)
Glucose-Capillary: 152 mg/dL — ABNORMAL HIGH (ref 70–99)
Glucose-Capillary: 170 mg/dL — ABNORMAL HIGH (ref 70–99)
Glucose-Capillary: 196 mg/dL — ABNORMAL HIGH (ref 70–99)
Glucose-Capillary: 93 mg/dL (ref 70–99)

## 2023-01-20 LAB — PHOSPHORUS: Phosphorus: 3.2 mg/dL (ref 2.5–4.6)

## 2023-01-20 LAB — MAGNESIUM: Magnesium: 1.8 mg/dL (ref 1.7–2.4)

## 2023-01-20 MED ORDER — MAGNESIUM SULFATE 2 GM/50ML IV SOLN
2.0000 g | Freq: Once | INTRAVENOUS | Status: AC
  Administered 2023-01-20: 2 g via INTRAVENOUS
  Filled 2023-01-20: qty 50

## 2023-01-20 NOTE — Progress Notes (Signed)
[place] Hospital Day(s): 4.   Post op day(s): 1 Day Post-Op.   Interval History: Patient seen and examined, patient remains intubated, sedated.  Review of Systems:  Unobtainable.  Vital signs in last 24 hours: [min-max] current  Temp:  [97.3 F (36.3 C)-98.2 F (36.8 C)] 98.2 F (36.8 C) (03/30 0400) Pulse Rate:  [69-92] 84 (03/30 0600) Resp:  [7-18] 15 (03/30 0600) BP: (89-138)/(48-71) 104/58 (03/30 0600) SpO2:  [90 %-100 %] 95 % (03/30 0802) FiO2 (%):  [30 %] 30 % (03/30 0802) Weight:  [70.6 kg] 70.6 kg (03/30 0500)     Height: 5\' 2"  (157.5 cm) Weight: 70.6 kg BMI (Calculated): 28.46   Intake/Output last 2 shifts:  03/29 0701 - 03/30 0700 In: 3330.5 [I.V.:681.6; NG/GT:1904.7; IV Piggyback:684.2] Out: [id number] [Urine:1060; Drains:125; Stool:60]   Physical Exam:  Constitutional: Sedated Respiratory: Ventilated Cardiovascular: regular rate and sinus rhythm  Gastrointestinal: soft, non-tender, and non-distended Integumentary: Wound VAC in place with good seal, no evidence of leak, serosanguineous drainage.  Labs:     Latest Ref Rng & Units 01/20/2023    4:35 AM 01/19/2023   12:07 PM 01/19/2023    4:17 AM  CBC  WBC 4.0 - 10.5 K/uL 14.3  18.1  16.6   Hemoglobin 12.0 - 15.0 g/dL 7.6  7.8  7.2   Hematocrit 36.0 - 46.0 % 25.5  25.9  24.1   Platelets 150 - 400 K/uL 247  243  224       Latest Ref Rng & Units 01/20/2023    4:35 AM 01/19/2023   12:07 PM 01/18/2023    5:30 AM  CMP  Glucose 70 - 99 mg/dL 146  126  134   BUN 6 - 20 mg/dL 33  38  38   Creatinine 0.44 - 1.00 mg/dL 0.60  0.70  0.76   Sodium 135 - 145 mmol/L 144  145  152   Potassium 3.5 - 5.1 mmol/L 4.2  3.3  3.5   Chloride 98 - 111 mmol/L 115  114  122   CO2 22 - 32 mmol/L 20  21  22    Calcium 8.9 - 10.3 mg/dL 8.0  8.1  8.3   Total Protein 6.5 - 8.1 g/dL   7.5   Total Bilirubin 0.3 - 1.2 mg/dL   0.7   Alkaline Phos 38 - 126 U/L   85   AST 15 - 41 U/L   26   ALT 0 -  44 U/L   15      Imaging studies: No new pertinent imaging studies   Assessment/Plan:  52 y.o. female with  1 Day Post-Op s/p Wound exploration, wound VAC placement for suprapubic former abscess site, complicated by pertinent comorbidities including:  Patient Active Problem List   Diagnosis Date Noted   Abdominal wall cellulitis 01/17/2023   Abscess 01/17/2023   Pneumonia 01/16/2023   Sepsis ([place]) 01/16/2023   Leukocytosis 01/16/2023   HCAP (healthcare-associated pneumonia) 07/04/2021   Aspiration pneumonia ([place]) 07/04/2021   Chronic diastolic CHF (congestive heart failure) ([place]) [id number]   Acute metabolic encephalopathy [id number]   Hyperkalemia 07/04/2021   Type II diabetes mellitus with renal manifestations ([place]) 07/04/2021   Wound, open, in back and left leg 07/04/2021   Chronic indwelling Foley catheter    Leaking PEG tube ([place])    Failure to thrive in adult 04/24/2021   Requires assistance with all daily activities 04/24/2021   G tube feedings ([place]) 04/24/2021  Closed injury of head 09/06/2020   Seizure disorder ([place]) 09/06/2020   Vitamin D deficiency 09/06/2020   Mild malnutrition ([place])    Protein-calorie malnutrition, severe 05/20/2020   Weakness    Full code status    Hypokalemia 05/16/2020   Severe sepsis with septic shock ([place]) 05/15/2020   Sacral decubitus ulcer, stage IV ([place])    Goals of care, counseling/discussion    Palliative care by specialist    DNR (do not resuscitate) discussion    Septic shock ([place]) 03/02/2020   Hypernatremia 11/15/2019   Thrombocytopenia ([place]) 11/15/2019   AKI (acute kidney injury) ([place]) 11/15/2019   HTN (hypertension) 11/15/2019   Non-insulin dependent type 2 diabetes mellitus ([place]) 11/15/2019   Hypothyroidism 11/15/2019   Severe sepsis with acute organ dysfunction ([place]) 11/15/2019   Left hemiplegia ([place]) 11/15/2019   Pressure injury of skin 11/15/2019   Aspiration pneumonia due to gastric secretions ([facility])    Acute  kidney injury (AKI) with acute tubular necrosis (ATN) (H[facility])    Traumatic brain injury ([place])    Abdominal pain 08/05/2019   Vomiting 08/05/2019   Acute on chronic respiratory failure with hypoxia ([place]) 06/06/2018   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   Pneumonia due to Klebsiella pneumoniae ([place]) 06/06/2018   Acute on chronic combined systolic and diastolic CHF (congestive heart failure) ([place]) 06/06/2018   Sepsis secondary to UTI ([place]) 06/06/2018   ARDS (adult respiratory distress syndrome) ([place]) 06/06/2018   Acute on chronic respiratory failure with hypoxia ([place]) 06/06/2018   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  -Continue Zosyn, and remainder of care per primary service.   -- Ronny Bacon, M.D., [facility] 01/20/2023

## 2023-01-20 NOTE — Progress Notes (Signed)
[facility] ADULT ICU REPLACEMENT PROTOCOL   The patient does apply for the [facility] Adult ICU Electrolyte Replacment Protocol based on the criteria listed below:   1.Exclusion criteria: TCTS, ECMO, Dialysis, and Myasthenia Gravis patients 2. Is GFR >/= 30 ml/min? Yes.    Patient's GFR today is >60 3. Is SCr </= 2? Yes.   Patient's SCr is 0.60 mg/dL 4. Did SCr increase >/= 0.5 in 24 hours? No 5.Pt's weight >40kg  Yes.   6. Abnormal electrolyte(s): Magnesium 1.8  7. Electrolytes replaced per protocol 8.  Call MD STAT for K+ </= 2.5, Phos </= 1, or Mag </= 1 Physician:  Dr. Harlon Flor A Maurine Mowbray 01/20/2023 6:24 AM

## 2023-01-20 NOTE — Progress Notes (Signed)
NAME:  Joanne [name], MRN:  [id number], DOB:  1971/03/17, LOS: 4 ADMISSION DATE:  01/16/2023, CONSULTATION DATE:  01/17/23 REFERRING MD:  K.Foust, NP, CHIEF COMPLAINT:  Code sepsis   History of Present Illness:  52 year old female presenting to [facility] ED from [facility] via EMS on 01/16/2023 for evaluation of altered mental status and concern for sepsis. History provided per chart review. EMS initially called out for multiple electrolyte derangements, SNF staff was also concerned she might be septic.  They reported her potassium was elevated at 6.7 and sodium 154. patient had a chest x-ray done on 01/15/2023 outpatient which revealed pneumonia and she was started on Rocephin.  She also has a PICC line in place as well as a chronic indwelling catheter.  At baseline the patient is bed bound & nonverbal with some chronic contractures in place and a legal guardian.  ED course: Upon arrival patient febrile, tachypneic and tachycardic with marginal blood pressure.  Sepsis protocol initiated patient received IV fluid resuscitation as well as empiric antibiotics.  Her Foley catheter was exchanged and TRH consulted for admission.  Labs significant for hypernatremia, hyperkalemia, elevated chloride and BUN, Transaminitis, hypoalbuminemia, significant leukocytosis with left shift and mild anemia.  Chest x-ray concerning for atelectasis versus pneumonia and CT abdomen and pelvis concerning for Fournier's gangrene and surgery was urgently consulted.  Patient went directly to the OR for intervention.   ABG: 7.38/34/129/20.1 CXR 01/16/2023: Patchy opacities in the right lower lung atelectasis versus infection.  Stable cardiomegaly CT CT abdomen pelvis with contrast 01/17/2023: Extensive soft tissue infiltration, soft tissue gas and loculated collections in the low anterior pelvic fat extending into the genital region consistent with Fournier's gas gangrene.  Delayed appearance of contrast material in the renal  collecting systems may indicate metabolic disease.  No evidence of obstruction.  Broad-based ventral abdominal wall hernia containing small and large bowel without proximal obstruction.  During surgery it was discovered the patient did not have Fournier's gas gangrene but a suprapubic abscess which was debrided and packed.  The patient was a difficult intubation due to contractures.  Plan to return to the OR on Friday, 01/19/2023.  PCCM consulted for assistance in management and monitoring due to postoperative mechanical ventilatory support in the setting of suspected sepsis secondary to suprapubic abscess in the setting of possible CAP.  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  Post surgical intervention patient requiring mechanical ventilation and transferred to ICU. 3/27 remains on vent 3/28 remains on vent 3/29: wound vac change 3/30: did about 8 hours on SBT yesterday   Interim History / Subjective:  Patient remains on vent OR today for wound exploration and wound vac exchange  Vent Mode: PRVC FiO2 (%):  [30 %] 30 % Set Rate:  [15 bmp] 15 bmp Vt Set:  [450 mL] 450 mL PEEP:  [5 cmH20] 5 cmH20 Pressure Support:  [25 cmH20] 25 cmH20 Plateau Pressure:  [30 cmH20-32 cmH20] 30 cmH20   Objective   Blood pressure (!) 108/56, pulse 71, temperature 98 F (36.7 C), temperature source Axillary, resp. rate 15, height 5\' 2"  (1.575 m), weight 70.6 kg, SpO2 96 %.    Vent Mode: PRVC FiO2 (%):  [  30 %] 30 % Set Rate:  [15 bmp] 15 bmp Vt Set:  [450 mL] 450 mL PEEP:  [5 cmH20] 5 cmH20 Pressure Support:  [25 cmH20] 25 cmH20 Plateau Pressure:   [30 cmH20-32 cmH20] 30 cmH20   Intake/Output Summary (Last 24 hours) at 01/20/2023 0946 Last data filed at 01/20/2023 0800 Gross per 24 hour  Intake 3406.99 ml  Output 1245 ml  Net 2161.99 ml   Filed Weights   01/18/23 0702 01/19/23 0456 01/20/23 0500  Weight: 70.3 kg 70 kg 70.6 kg     REVIEW OF SYSTEMS  PATIENT IS UNABLE TO PROVIDE COMPLETE REVIEW OF SYSTEMS DUE TO SEVERE CRITICAL ILLNESS   PHYSICAL EXAMINATION:  GENERAL:critically ill appearing, EYES: Pupils equal, round, reactive to light.  No scleral icterus.  MOUTH: Moist mucosal membrane.  NECK: Supple.  PULMONARY: Lungs clear to auscultation, +rhonchi, +wheezing CARDIOVASCULAR: S1 and S2.  Regular rate and rhythm GASTROINTESTINAL: Soft, nontender, -distended. Positive bowel sounds.  MUSCULOSKELETAL: No swelling, clubbing, or edema.  NEUROLOGIC: obtunded,sedated Skin: Significant scar tissue on posterior gluteal region and sacrum from chronic pressure injuries.  Open sacral wounds and abrasions to right contracted foot    Resolved Hospital Problem list     Assessment & Plan:  Suspected Sepsis secondary to suprapubic abscess in the setting of possible CAP- present on admission POST OP RESP FAILURE DUE TO SEVERE SEPSIS  Severe ACUTE Hypoxic and Hypercapnic Respiratory Failure POST OP FAILURE DUE TO SEVERE SEPSIS -continue Mechanical Ventilator support -Wean Fio2 and PEEP as tolerated -VAP/VENT bundle implementation - Wean PEEP & FiO2 as tolerated, maintain SpO2 > 88% - Head of bed elevated 30 degrees, VAP protocol in place - Plateau pressures less than 30 cm H20  - Intermittent chest x-ray & ABG PRN - Ensure adequate pulmonary hygiene  - SAT and SBT again today  SEVERE SEPSIS SOURCE-SUPRAPUBIC ABSCESS -use vasopressors to keep MAP>65 as needed -follow ABG and LA as needed -follow up cultures -empiric ABX -aggressive IV fluid Resuscitation   NEUROLOGY ACUTE METABOLIC ENCEPHALOPATHY Chronic Seizure  Disorder PMHx: TBI - adjusted carbamazepine XR to 50 mg Q 6 h per OGT - neuro checks Q 4  Hypothyroidism - continue outpatient Synthroid  Chronic contractures 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 - continue Baclofen - WOC consulted - Turn Q 2 h, ROM Q 4, utilize offloading devices   ENDO - ICU hypoglycemic\Hyperglycemia protocol -check FSBS per protocol   GI GI PROPHYLAXIS as indicated  NUTRITIONAL STATUS DIET-->TF's as tolerated Constipation protocol as indicated   ELECTROLYTES -follow labs as needed -replace as needed -pharmacy consultation and following     Best Practice (right click and "Reselect all SmartList Selections" daily)  Diet/type: NPO w/ meds via tube DVT prophylaxis: LMWH GI prophylaxis: H2B Lines: Central line, Arterial Line, and yes and it is still needed Foley:  Yes, and it is still needed Code Status:  full code   Labs   CBC: Recent Labs  Lab 01/16/23 2233 01/17/23 0521 01/18/23 0530 01/18/23 1032 01/19/23 0417 01/19/23 1207 01/20/23 0435  WBC 34.2* 40.9* 20.7*  --  16.6* 18.1* 14.3*  NEUTROABS 30.7*  --   --   --   --   --   --   HGB 10.8* 9.2* 7.4* 7.3* 7.2* 7.8* 7.6*  HCT 34.8* 31.1* 25.7* 24.6* 24.1* 25.9* 25.5*  MCV 88.5 89.6 92.1  --  92.0 89.9 91.7  PLT 318 289 223  --  224 243 [id number]    Basic Metabolic Panel: Recent Labs  Lab 01/16/23 2233 01/17/23 0521 01/18/23 0530 01/19/23 0414 01/19/23 1207 01/20/23 0435  NA 153* 155*  155* 152*  --  145 144  K 5.2* 3.1*  3.1* 3.5  --  3.3* 4.2  CL 125* 121*  121* 122*  --  114* 115*  CO2 23 20*  20* 22  --  21* 20*  GLUCOSE 140* 184*  187* 134*  --  126* 146*  BUN 55* 44*  47* 38*  --  38* 33*  CREATININE 0.75 0.70  0.68 0.76  --  0.70 0.60  CALCIUM 8.0* 7.9*  7.9* 8.3*  --  8.1* 8.0*  MG  --  1.9 2.0 1.9  --  1.8  PHOS  --  3.0 3.8 4.3  --  3.2   GFR: Estimated Creatinine Clearance: 75.7 mL/min (by C-G formula based on SCr of 0.6 mg/dL). Recent Labs   Lab 01/16/23 2233 01/17/23 0521 01/18/23 0530 01/19/23 0417 01/19/23 1207 01/20/23 0435  PROCALCITON  --  1.96  --   --   --   --   WBC 34.2* 40.9* 20.7* 16.6* 18.1* 14.3*  LATICACIDVEN 1.6 1.3  --   --   --   --     Liver Function Tests: Recent Labs  Lab 01/16/23 2233 01/17/23 0521 01/18/23 0530  AST 33 26 26  ALT 20 18 15   ALKPHOS 133* 113 85  BILITOT 1.1 1.2 0.7  PROT 8.1 6.9 7.5  ALBUMIN 1.9* 1.6* 3.3*   No results for input(s): "LIPASE", "AMYLASE" in the last 168 hours. No results for input(s): "AMMONIA" in the last 168 hours.  ABG    Component Value Date/Time   PHART 7.38 01/17/2023 0445   PCO2ART 34 01/17/2023 0445   PO2ART 129 (H) 01/17/2023 0445   HCO3 20.1 01/17/2023 0445   ACIDBASEDEF 4.2 (H) 01/17/2023 0445   O2SAT 99.2 01/17/2023 0445     Coagulation Profile: Recent Labs  Lab 01/16/23 2233 01/17/23 0521  INR 1.5* 1.6*    Cardiac Enzymes: Recent Labs  Lab 01/17/23 0521  CKTOTAL 75    HbA1C: Hgb A1c MFr Bld  Date/Time Value Ref Range Status  01/17/2023 05:21 AM 6.9 (H) 4.8 - 5.6 % Final    Comment:    (NOTE)         Prediabetes: 5.7 - 6.4         Diabetes: >6.4         Glycemic control for adults with diabetes: <7.0   07/07/2021 03:16 AM 5.7 (H) 4.8 - 5.6 % Final    Comment:    (NOTE) Pre diabetes:          5.7%-6.4%  Diabetes:              >6.4%  Glycemic control for   <7.0% adults with diabetes     CBG: Recent Labs  Lab 01/19/23 1558 01/19/23 1926 01/19/23 2352 01/20/23 0328 01/20/23 0815  GLUCAP 84 110* 139* 124* 152*      DVT/GI PRX  assessed I Assessed the need for Labs I Assessed the need for Foley I Assessed the need for Central Venous Line Family Discussion when available I Assessed the need for Mobilization I made an Assessment of medications to be adjusted accordingly Safety Risk assessment completed  CASE DISCUSSED IN MULTIDISCIPLINARY ROUNDS WITH ICU TEAM     Critical Care Time devoted to  patient care services described in this note is 30 minutes.  Critical care was necessary to treat /prevent  imminent and life-threatening deterioration. Overall, patient is critically ill, prognosis is guarded.  Patient with Multiorgan failure and at high risk for cardiac arrest and death.   Arcelia Jew MD Pulmonary & Critical Care Medicine

## 2023-01-21 DIAGNOSIS — J189 Pneumonia, unspecified organism: Secondary | ICD-10-CM | POA: Diagnosis not present

## 2023-01-21 LAB — CBC
HCT: 23.7 % — ABNORMAL LOW (ref 36.0–46.0)
Hemoglobin: 7.1 g/dL — ABNORMAL LOW (ref 12.0–15.0)
MCH: 27.1 pg (ref 26.0–34.0)
MCHC: 30 g/dL (ref 30.0–36.0)
MCV: 90.5 fL (ref 80.0–100.0)
Platelets: 240 10*3/uL (ref 150–400)
RBC: 2.62 MIL/uL — ABNORMAL LOW (ref 3.87–5.11)
RDW: 14.8 % (ref 11.5–15.5)
WBC: 13 10*3/uL — ABNORMAL HIGH (ref 4.0–10.5)
nRBC: 0 % (ref 0.0–0.2)

## 2023-01-21 LAB — BASIC METABOLIC PANEL
Anion gap: 6 (ref 5–15)
BUN: 22 mg/dL — ABNORMAL HIGH (ref 6–20)
CO2: 22 mmol/L (ref 22–32)
Calcium: 7.9 mg/dL — ABNORMAL LOW (ref 8.9–10.3)
Chloride: 114 mmol/L — ABNORMAL HIGH (ref 98–111)
Creatinine, Ser: 0.55 mg/dL (ref 0.44–1.00)
GFR, Estimated: >60 mL/min (ref >60)
Glucose, Bld: 127 mg/dL — ABNORMAL HIGH (ref 70–99)
Potassium: 3.8 mmol/L (ref 3.5–5.1)
Sodium: 142 mmol/L (ref 135–145)

## 2023-01-21 LAB — CULTURE, BLOOD (ROUTINE X 2)
Culture: NO GROWTH
Culture: NO GROWTH
Special Requests: ADEQUATE

## 2023-01-21 LAB — GLUCOSE, CAPILLARY
Glucose-Capillary: 104 mg/dL — ABNORMAL HIGH (ref 70–99)
Glucose-Capillary: 106 mg/dL — ABNORMAL HIGH (ref 70–99)
Glucose-Capillary: 108 mg/dL — ABNORMAL HIGH (ref 70–99)
Glucose-Capillary: 120 mg/dL — ABNORMAL HIGH (ref 70–99)
Glucose-Capillary: 173 mg/dL — ABNORMAL HIGH (ref 70–99)
Glucose-Capillary: 95 mg/dL (ref 70–99)

## 2023-01-21 LAB — MAGNESIUM: Magnesium: 2 mg/dL (ref 1.7–2.4)

## 2023-01-21 LAB — PHOSPHORUS: Phosphorus: 2.5 mg/dL (ref 2.5–4.6)

## 2023-01-21 NOTE — Progress Notes (Signed)
NAME:  Joanne [name], MRN:  [id number], DOB:  01-Mar-1971, LOS: 5 ADMISSION DATE:  01/16/2023, CONSULTATION DATE:  01/17/23 REFERRING MD:  K.Foust, NP, CHIEF COMPLAINT:  Code sepsis   History of Present Illness:  52 year old female presenting to [facility] ED from [facility] via EMS on 01/16/2023 for evaluation of altered mental status and concern for sepsis. History provided per chart review. EMS initially called out for multiple electrolyte derangements, SNF staff was also concerned she might be septic.  They reported her potassium was elevated at 6.7 and sodium 154. patient had a chest x-ray done on 01/15/2023 outpatient which revealed pneumonia and she was started on Rocephin.  She also has a PICC line in place as well as a chronic indwelling catheter.  At baseline the patient is bed bound & nonverbal with some chronic contractures in place and a legal guardian.  ED course: Upon arrival patient febrile, tachypneic and tachycardic with marginal blood pressure.  Sepsis protocol initiated patient received IV fluid resuscitation as well as empiric antibiotics.  Her Foley catheter was exchanged and TRH consulted for admission.  Labs significant for hypernatremia, hyperkalemia, elevated chloride and BUN, Transaminitis, hypoalbuminemia, significant leukocytosis with left shift and mild anemia.  Chest x-ray concerning for atelectasis versus pneumonia and CT abdomen and pelvis concerning for Fournier's gangrene and surgery was urgently consulted.  Patient went directly to the OR for intervention.   ABG: 7.38/34/129/20.1 CXR 01/16/2023: Patchy opacities in the right lower lung atelectasis versus infection.  Stable cardiomegaly CT CT abdomen pelvis with contrast 01/17/2023: Extensive soft tissue infiltration, soft tissue gas and loculated collections in the low anterior pelvic fat extending into the genital region consistent with Fournier's gas gangrene.  Delayed appearance of contrast material in the renal  collecting systems may indicate metabolic disease.  No evidence of obstruction.  Broad-based ventral abdominal wall hernia containing small and large bowel without proximal obstruction.  During surgery it was discovered the patient did not have Fournier's gas gangrene but a suprapubic abscess which was debrided and packed.  The patient was a difficult intubation due to contractures.  Plan to return to the OR on Friday, 01/19/2023.  PCCM consulted for assistance in management and monitoring due to postoperative mechanical ventilatory support in the setting of suspected sepsis secondary to suprapubic abscess in the setting of possible CAP.  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  Post surgical intervention patient requiring mechanical ventilation and transferred to ICU. 3/27 remains on vent 3/28 remains on vent 3/29: wound vac change 3/30: did about 8 hours on SBT yesterday 3/31: only did 3.5 hours on SBT yesterday but could have gone much longer   Interim History / Subjective:  Patient remains on vent Wound vac exchange Monday at bedside  Vent Mode: PRVC FiO2 (%):  [30 %] 30 % Set Rate:  [15 bmp] 15 bmp Vt Set:  [450 mL] 450 mL PEEP:  [5 cmH20] 5 cmH20 Pressure Support:  [25 cmH20] 25 cmH20   Objective   Blood pressure (!) 91/53, pulse 85, temperature 98.4 F (36.9 C), temperature source Axillary, resp. rate 15, height 5\' 2"  (1.575 m), weight 71.2 kg, SpO2 94 %.    Vent Mode:  PRVC FiO2 (%):  [30 %] 30 % Set Rate:  [15 bmp] 15 bmp Vt Set:  [450 mL] 450 mL PEEP:  [5 cmH20] 5 cmH20 Pressure Support:  [25 cmH20] 25 cmH20    Intake/Output Summary (Last 24 hours) at 01/21/2023 0842 Last data filed at 01/21/2023 [id number] Gross per 24 hour  Intake 2460.87 ml  Output 1130 ml  Net 1330.87 ml   Filed Weights   01/19/23 0456 01/20/23 0500 01/21/23 0451  Weight: 70 kg 70.6 kg 71.2 kg     REVIEW OF SYSTEMS  PATIENT IS UNABLE TO PROVIDE COMPLETE REVIEW OF SYSTEMS DUE TO SEVERE CRITICAL ILLNESS   PHYSICAL EXAMINATION:  GENERAL:critically ill appearing, EYES: Pupils equal, round, reactive to light.  No scleral icterus.  MOUTH: Moist mucosal membrane.  NECK: Supple.  PULMONARY: Lungs clear to auscultation, +rhonchi, +wheezing CARDIOVASCULAR: S1 and S2.  Regular rate and rhythm GASTROINTESTINAL: Soft, nontender, -distended. Positive bowel sounds.  MUSCULOSKELETAL: No swelling, clubbing, or edema.  NEUROLOGIC: obtunded,sedated Skin: Significant scar tissue on posterior gluteal region and sacrum from chronic pressure injuries.  Open sacral wounds and abrasions to right contracted foot    Resolved Hospital Problem list     Assessment & Plan:  Suspected Sepsis secondary to suprapubic abscess in the setting of possible CAP- present on admission POST OP RESP FAILURE DUE TO SEVERE SEPSIS  Severe ACUTE Hypoxic and Hypercapnic Respiratory Failure POST OP FAILURE DUE TO SEVERE SEPSIS - continue Mechanical Ventilator support - Wean Fio2 and PEEP as tolerated - VAP/VENT bundle implementation - Wean PEEP & FiO2 as tolerated, maintain SpO2 > 88% - Head of bed elevated 30 degrees, VAP protocol in place - Plateau pressures less than 30 cm H20  - Intermittent chest x-ray & ABG PRN - Ensure adequate pulmonary hygiene  - SAT and SBT again today  SEVERE SEPSIS SOURCE-SUPRAPUBIC ABSCESS - use vasopressors to keep MAP>65 as needed - follow ABG and LA as needed - follow up cultures - empiric ABX - aggressive IV fluid Resuscitation   NEUROLOGY ACUTE METABOLIC ENCEPHALOPATHY Chronic Seizure Disorder PMHx: TBI -  adjusted carbamazepine XR to 50 mg Q 6 h per OGT - neuro checks Q 4  Hypothyroidism - continue outpatient Synthroid  Chronic contractures 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 - continue Baclofen - WOC consulted - Turn Q 2 h, ROM Q 4, utilize offloading devices   ENDO - ICU hypoglycemic\Hyperglycemia protocol - check FSBS per protocol   GI GI PROPHYLAXIS as indicated  NUTRITIONAL STATUS DIET-->TF's as tolerated Constipation protocol as indicated   ELECTROLYTES -follow labs as needed -replace as needed -pharmacy consultation and following     Best Practice (right click and "Reselect all SmartList Selections" daily)  Diet/type: NPO w/ meds via tube DVT prophylaxis: LMWH GI prophylaxis: H2B Lines: Central line, Arterial Line, and yes and it is still needed Foley:  Yes, and it is still needed Code Status:  full code   Labs   CBC: Recent Labs  Lab 01/16/23 2233 01/17/23 0521 01/18/23 0530 01/18/23 1032 01/19/23 0417 01/19/23 1207 01/20/23 0435 01/21/23 0500  WBC 34.2*   < > 20.7*  --  16.6* 18.1* 14.3* 13.0*  NEUTROABS 30.7*  --   --   --   --   --   --   --   HGB 10.8*   < > 7.4* 7.3* 7.2* 7.8* 7.6* 7.1*  HCT 34.8*   < > 25.7* 24.6* 24.1* 25.9* 25.5* 23.7*  MCV 88.5   < > 92.1  --  92.0 89.9 91.7 90.5  PLT 318   < > 223  --  224 243 247 240   < > = values in this interval not displayed.    Basic Metabolic Panel: Recent Labs  Lab 01/17/23 0521 01/18/23 0530 01/19/23 0414 01/19/23 1207 01/20/23 0435 01/21/23 0500  NA 155*  155* 152*  --  145 144 142  K 3.1*  3.1* 3.5  --  3.3* 4.2 3.8  CL 121*  121* 122*  --  114* 115* 114*  CO2 20*  20* 22  --  21* 20* 22  GLUCOSE 184*  187* 134*  --  126* 146* 127*  BUN 44*  47* 38*  --  38* 33* 22*  CREATININE 0.70  0.68 0.76  --  0.70 0.60 0.55  CALCIUM 7.9*  7.9* 8.3*  --  8.1* 8.0* 7.9*  MG 1.9 2.0 1.9  --  1.8 2.0  PHOS 3.0 3.8 4.3  --  3.2 2.5   GFR: Estimated Creatinine  Clearance: 76 mL/min (by C-G formula based on SCr of 0.55 mg/dL). Recent Labs  Lab 01/16/23 2233 01/17/23 0521 01/18/23 0530 01/19/23 0417 01/19/23 1207 01/20/23 0435 01/21/23 0500  PROCALCITON  --  1.96  --   --   --   --   --   WBC 34.2* 40.9*   < > 16.6* 18.1* 14.3* 13.0*  LATICACIDVEN 1.6 1.3  --   --   --   --   --    < > = values in this interval not displayed.    Liver Function Tests: Recent Labs  Lab 01/16/23 2233 01/17/23 0521 01/18/23 0530  AST 33 26 26  ALT 20 18 15   ALKPHOS 133* 113 85  BILITOT 1.1 1.2 0.7  PROT 8.1 6.9 7.5  ALBUMIN 1.9* 1.6* 3.3*   No results for input(s): "LIPASE", "AMYLASE" in the last 168 hours. No results for input(s): "AMMONIA" in the last 168 hours.  ABG    Component Value Date/Time   PHART 7.38 01/17/2023 0445   PCO2ART 34 01/17/2023 0445   PO2ART 129 (H) 01/17/2023 0445   HCO3 20.1 01/17/2023 0445   ACIDBASEDEF 4.2 (H) 01/17/2023 0445   O2SAT 99.2 01/17/2023 0445     Coagulation Profile: Recent Labs  Lab 01/16/23 2233 01/17/23 0521  INR 1.5* 1.6*    Cardiac Enzymes: Recent Labs  Lab 01/17/23 0521  CKTOTAL 75    HbA1C: Hgb A1c MFr Bld  Date/Time Value Ref Range Status  01/17/2023 05:21 AM 6.9 (H) 4.8 - 5.6 % Final    Comment:    (NOTE)         Prediabetes: 5.7 - 6.4         Diabetes: >6.4         Glycemic control for adults with diabetes: <7.0   07/07/2021 03:16 AM 5.7 (H) 4.8 - 5.6 % Final    Comment:    (NOTE) Pre diabetes:          5.7%-6.4%  Diabetes:              >6.4%  Glycemic control for   <7.0% adults with diabetes     CBG: Recent Labs  Lab 01/20/23 1732 01/20/23 1918 01/20/23 2316 01/21/23 0434 01/21/23 0824  GLUCAP 112* 93 170* 95 106*      DVT/GI PRX  assessed I Assessed the need for Labs I Assessed the need for Foley I Assessed the need for Central Venous Line Family Discussion when  available I Assessed the need for Mobilization I made an Assessment of medications to be  adjusted accordingly Safety Risk assessment completed  CASE DISCUSSED IN MULTIDISCIPLINARY ROUNDS WITH ICU TEAM     Critical Care Time devoted to patient care services described in this note is 35 minutes.  Critical care was necessary to treat /prevent imminent and life-threatening deterioration. Overall, patient is critically ill, prognosis is guarded.  Patient with Multiorgan failure and at high risk for cardiac arrest and death.   Arcelia Jew MD Pulmonary & Critical Care Medicine

## 2023-01-22 DIAGNOSIS — A419 Sepsis, unspecified organism: Secondary | ICD-10-CM | POA: Diagnosis not present

## 2023-01-22 DIAGNOSIS — L03311 Cellulitis of abdominal wall: Secondary | ICD-10-CM

## 2023-01-22 DIAGNOSIS — G9341 Metabolic encephalopathy: Secondary | ICD-10-CM | POA: Diagnosis not present

## 2023-01-22 DIAGNOSIS — R6520 Severe sepsis without septic shock: Secondary | ICD-10-CM

## 2023-01-22 DIAGNOSIS — L0291 Cutaneous abscess, unspecified: Secondary | ICD-10-CM | POA: Diagnosis not present

## 2023-01-22 LAB — BASIC METABOLIC PANEL
Anion gap: 9 (ref 5–15)
BUN: 21 mg/dL — ABNORMAL HIGH (ref 6–20)
CO2: 21 mmol/L — ABNORMAL LOW (ref 22–32)
Calcium: 8.2 mg/dL — ABNORMAL LOW (ref 8.9–10.3)
Chloride: 109 mmol/L (ref 98–111)
Creatinine, Ser: 0.5 mg/dL (ref 0.44–1.00)
GFR, Estimated: >60 mL/min (ref >60)
Glucose, Bld: 106 mg/dL — ABNORMAL HIGH (ref 70–99)
Potassium: 3.8 mmol/L (ref 3.5–5.1)
Sodium: 139 mmol/L (ref 135–145)

## 2023-01-22 LAB — PHOSPHORUS: Phosphorus: 2.8 mg/dL (ref 2.5–4.6)

## 2023-01-22 LAB — MAGNESIUM: Magnesium: 1.7 mg/dL (ref 1.7–2.4)

## 2023-01-22 LAB — GLUCOSE, CAPILLARY
Glucose-Capillary: 108 mg/dL — ABNORMAL HIGH (ref 70–99)
Glucose-Capillary: 110 mg/dL — ABNORMAL HIGH (ref 70–99)
Glucose-Capillary: 112 mg/dL — ABNORMAL HIGH (ref 70–99)
Glucose-Capillary: 116 mg/dL — ABNORMAL HIGH (ref 70–99)
Glucose-Capillary: 120 mg/dL — ABNORMAL HIGH (ref 70–99)
Glucose-Capillary: 121 mg/dL — ABNORMAL HIGH (ref 70–99)
Glucose-Capillary: 96 mg/dL (ref 70–99)

## 2023-01-22 NOTE — Consult Note (Signed)
WOC received consult for NPWT dressing changes M-W-F post suprapubic abscess washout and NPWT placement 3/29.  NPWT changed by surgeon (Dr. Lysle Pearl) Monday 01/22/2023, [place] will change NPWT Wednesday 01/24/2023 as per order.   Thank you,    Shelton Silvas MSN, RN-BC, [employer] [phone]

## 2023-01-22 NOTE — Progress Notes (Signed)
Subjective:  CC: Joanne [name] is a 52 y.o. female  Hospital stay day 6, 3 Days Post-Op suprapubic abscess incision and drainage  HPI: No acute issues reported overnight.  ROS:  Unable to obtain secondary to patient's status   Objective:   Temp:  [98.5 F (36.9 C)-99.2 F (37.3 C)] 98.5 F (36.9 C) (04/01 0400) Pulse Rate:  [67-89] 77 (04/01 0700) Resp:  [13-29] 16 (04/01 0700) BP: (92-113)/(46-61) 102/56 (04/01 0700) SpO2:  [94 %-98 %] 97 % (04/01 0817) FiO2 (%):  [30 %] 30 % (04/01 0817)     Height: 5\' 2"  (157.5 cm) Weight: 71.2 kg BMI (Calculated): 28.7   Intake/Output this shift:   Intake/Output Summary (Last 24 hours) at 01/22/2023 1104 Last data filed at 01/22/2023 1022 Gross per 24 hour  Intake 1202.98 ml  Output 1563 ml  Net -360.02 ml    Constitutional :  Intubated and sedated  Respiratory:  clear to auscultation bilaterally  Cardiovascular:  regular rate and rhythm  Gastrointestinal: soft, non-tender; bowel sounds normal; no masses,  no organomegaly.   Skin: Cool and moist.  Suprapubic VAC dressing clean dry and intact  Psychiatric: Normal affect, non-agitated, not confused       LABS:     Latest Ref Rng & Units 01/21/2023    5:00 AM 01/20/2023    4:35 AM 01/19/2023   12:07 PM  CMP  Glucose 70 - 99 mg/dL 127  146  126   BUN 6 - 20 mg/dL 22  33  38   Creatinine 0.44 - 1.00 mg/dL 0.55  0.60  0.70   Sodium 135 - 145 mmol/L 142  144  145   Potassium 3.5 - 5.1 mmol/L 3.8  4.2  3.3   Chloride 98 - 111 mmol/L 114  115  114   CO2 22 - 32 mmol/L 22  20  21    Calcium 8.9 - 10.3 mg/dL 7.9  8.0  8.1       Latest Ref Rng & Units 01/21/2023    5:00 AM 01/20/2023    4:35 AM 01/19/2023   12:07 PM  CBC  WBC 4.0 - 10.5 K/uL 13.0  14.3  18.1   Hemoglobin 12.0 - 15.0 g/dL 7.1  7.6  7.8   Hematocrit 36.0 - 46.0 % 23.7  25.5  25.9   Platelets 150 - 400 K/uL 240  247  243     RADS: N/a Assessment:   Status post suprapubic abscess drainage.  Septic shock requiring  pressor support and ventilation. Continue care per ICU team.  DME wound vac changed at bedside personally by myself.  Old dressing removed and inspection of the wound bed noted pink healthy granulation tissue with no additional pockets or tunneling concerning for persistent abscess or infection.  Wound measurements remain mostly unchanged measuring 10 cm x 5.7 cm x 7.7 cm deep.  Black sponge cut to shape of the cavity and sealed in place.  VAC attached and placed 125 mmHg pressure continuous.  Good seal noted with no sign of leak.  Patient tolerated procedure well at bedside.  Recommend continue Monday Wednesday Friday wound VAC changes until less depth noted in the wound.  Wound ostomy nurse and/or floor nurse to continue changes for now.  Surgery will peripherally follow.  Will follow-up next week if patient still in-house.  She can follow-up in the office as outpatient in 1 week if discharged prior to.  Please call with any additional questions or concerns.  Labs/images/medications/previous chart entries reviewed personally and relevant changes/updates noted above.

## 2023-01-22 NOTE — Progress Notes (Signed)
Good day. Remained on spontaneous mode on ventilator since 1200. Fentanyl off since 1200. Answers yes no questions. At 1700 changed to 30%-5-25.spontaneous.

## 2023-01-22 NOTE — Progress Notes (Signed)
NAME:  Joanne [name], MRN:  [id number], DOB:  Mar 20, 1971, LOS: 6 ADMISSION DATE:  01/16/2023, CONSULTATION DATE:  01/17/2023 REFERRING MD:  Morton Amy, NP, CHIEF COMPLAINT:  Code Sepsis    History of Present Illness:  52 year old female presenting to [facility] ED from [facility] via EMS on 01/16/2023 for evaluation of altered mental status and concern for sepsis. History provided per chart review. EMS initially called out for multiple electrolyte derangements, SNF staff was also concerned she might be septic.  They reported her potassium was elevated at 6.7 and sodium 154. patient had a chest x-ray done on 01/15/2023 outpatient which revealed pneumonia and she was started on Rocephin.  She also has a PICC line in place as well as a chronic indwelling catheter.  At baseline the patient is bed bound & nonverbal with some chronic contractures in place and a legal guardian.   ED course: Upon arrival patient febrile, tachypneic and tachycardic with marginal blood pressure.  Sepsis protocol initiated patient received IV fluid resuscitation as well as empiric antibiotics.  Her Foley catheter was exchanged and TRH consulted for admission.  Labs significant for hypernatremia, hyperkalemia, elevated chloride and BUN, Transaminitis, hypoalbuminemia, significant leukocytosis with left shift and mild anemia.  Chest x-ray concerning for atelectasis versus pneumonia and CT abdomen and pelvis concerning for Fournier's gangrene and surgery was urgently consulted.  Patient went directly to the OR for intervention.    ABG: 7.38/34/129/20.1 CXR 01/16/2023: Patchy opacities in the right lower lung atelectasis versus infection.  Stable cardiomegaly CT CT abdomen pelvis with contrast 01/17/2023: Extensive soft tissue infiltration, soft tissue gas and loculated collections in the low anterior pelvic fat extending into the genital region consistent with Fournier's gas gangrene.  Delayed appearance of contrast material in the  renal collecting systems may indicate metabolic disease.  No evidence of obstruction.  Broad-based ventral abdominal wall hernia containing small and large bowel without proximal obstruction.   During surgery it was discovered the patient did not have Fournier's gas gangrene but a suprapubic abscess which was debrided and packed.  The patient was a difficult intubation due to contractures.  Plan to return to the OR on Friday, 01/19/2023.   PCCM consulted for assistance in management and monitoring due to postoperative mechanical ventilatory support in the setting of suspected sepsis secondary to suprapubic abscess in the setting of possible CAP.  Please see "Significant Hospital Events" section below for full detailed hospital course.  Pertinent  Medical History   Past Medical History:  Diagnosis Date   Acute kidney injury (AKI) with acute tubular necrosis (ATN) (HCC)    Acute on chronic combined systolic and diastolic CHF (congestive heart failure) ([place]) 06/06/2018   Acute on chronic respiratory failure with hypoxia ([place]) 06/06/2018   ARDS (adult respiratory distress syndrome) ([place]) 06/06/2018   Aspiration pneumonia due to gastric secretions (HCC)    Diabetes mellitus ([place])    History of traumatic brain injury    Hypothyroidism    Pneumonia due to Klebsiella pneumoniae ([place]) 06/06/2018   Seizure disorder ([place])    Severe sepsis ([place]) 06/06/2018   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 coli (ESBL), Proteus mirabilis, Enterococcus avium 3/27: MRSA PCR>> negative 3/27: Urine>> no growth 3/27: Respiratory viral panel>>Negative  Antimicrobials:   Anti-infectives (From admission, onward)    Start     Dose/Rate Route Frequency  Ordered Stop   01/17/23 2200  azithromycin (ZITHROMAX) 500 mg in sodium chloride 0.9 % 250 mL IVPB  Status:  Discontinued        500 mg 250 mL/hr  over 60 Minutes Intravenous Every 24 hours 01/17/23 0016 01/17/23 1018   01/17/23 1400  piperacillin-tazobactam (ZOSYN) IVPB 3.375 g        3.375 g 12.5 mL/hr over 240 Minutes Intravenous Every 8 hours 01/17/23 1045     01/17/23 1145  linezolid (ZYVOX) IVPB 600 mg  Status:  Discontinued        600 mg 300 mL/hr over 60 Minutes Intravenous Every 12 hours 01/17/23 1045 01/22/23 1048   01/17/23 0800  azithromycin (ZITHROMAX) 500 mg in sodium chloride 0.9 % 250 mL IVPB  Status:  Discontinued        500 mg 250 mL/hr over 60 Minutes Intravenous Every 24 hours 01/16/23 2344 01/17/23 0016   01/17/23 0700  ceFEPIme (MAXIPIME) 2 g in sodium chloride 0.9 % 100 mL IVPB  Status:  Discontinued        2 g 200 mL/hr over 30 Minutes Intravenous Every 8 hours 01/17/23 0027 01/17/23 1018   01/17/23 0030  vancomycin (VANCOREADY) IVPB 1750 mg/350 mL  Status:  Discontinued        1,750 mg 175 mL/hr over 120 Minutes Intravenous Every 24 hours 01/17/23 0029 01/17/23 1018   01/16/23 2230  vancomycin (VANCOCIN) IVPB 1000 mg/200 mL premix  Status:  Discontinued        1,000 mg 200 mL/hr over 60 Minutes Intravenous  Once 01/16/23 2215 01/16/23 2219   01/16/23 2230  ceFEPIme (MAXIPIME) 2 g in sodium chloride 0.9 % 100 mL IVPB        2 g 200 mL/hr over 30 Minutes Intravenous  Once 01/16/23 2215 01/16/23 2344   01/16/23 2230  azithromycin (ZITHROMAX) 500 mg in sodium chloride 0.9 % 250 mL IVPB        500 mg 250 mL/hr over 60 Minutes Intravenous  Once 01/16/23 2215 01/17/23 0043   01/16/23 2230  vancomycin (VANCOREADY) IVPB 1750 mg/350 mL  Status:  Discontinued        1,750 mg 175 mL/hr over 120 Minutes Intravenous  Once 01/16/23 2219 01/17/23 0029        Significant Hospital Events: Including procedures, antibiotic start and stop dates in addition to other pertinent events   01/16/23: Admit to PCU for severe sepsis, 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  Post surgical intervention patient requiring mechanical ventilation and transferred to ICU. 3/27 remains on vent 3/28 remains on vent 3/29: wound vac change 3/30: did about 8 hours on SBT yesterday 3/31: only did 3.5 hours on SBT yesterday but could have gone much longer 4/1: Plan for wound vac exchange today at bedside.  Remains on minimal vent settings, will perform SBT at tolerated following wound vac change.  Interim History / Subjective:  -NO significant events noted overnight -Afebrile, hemodynamically stable, no vasopressors -On minimal vent settings, perform SBT as tolerated once wound vac exchanged ~ hopeful for extubation -Surgery plans for wound vac exchange today at bedside -Labs currently pending for today  Objective   Blood pressure (!) 102/56, pulse 77, temperature 98.5 F (36.9 C), resp. rate 16, height 5\' 2"  (1.575 m), weight 71.2 kg, SpO2 97 %.    Vent Mode: PRVC FiO2 (%):  [30 %] 30 % Set Rate:  [15 bmp] 15  bmp Vt Set:  [450 mL] 450 mL PEEP:  [5 cmH20] 5 cmH20 Pressure Support:  [25 cmH20] 25 cmH20 Plateau Pressure:  [30 cmH20] 30 cmH20   Intake/Output Summary (Last 24 hours) at 01/22/2023 0930 Last data filed at 01/22/2023 [id number] Gross per 24 hour  Intake 1192.98 ml  Output 1563 ml  Net -370.02 ml   Filed Weights   01/19/23 0456 01/20/23 0500 01/21/23 0451  Weight: 70 kg 70.6 kg 71.2 kg    Examination: General: Acute on chronically ill-appearing cachectic female, on the vent and lightly sedated, no acute distress HENT: Atraumatic, normocephalic, neck contracted, orally intubated Lungs: Coarse breath sounds throughout, even, synchronous with the vent Cardiovascular: Regular rate and rhythm, S1-S2, no murmurs, rubs, gallops Abdomen: Soft, nontender, nondistended, no guarding or rebound tenderness, bowel sounds positive x 4 Extremities: All extremities contractured with bilateral foot drop Neuro: Lightly sedated, withdraws from pain, unable to  follow commands, pupils PERRLA GU: Foley catheter in place draining yellow urine  Resolved Hospital Problem list     Assessment & Plan:   #Post-Op Respiratory Failure in the setting of severe Sepsis & and suspected CAP -Full vent support, implement lung protective strategies -Plateau pressures less than 30 cm H20 -Wean FiO2 & PEEP as tolerated to maintain O2 sats >92% -Follow intermittent Chest X-ray & ABG as needed -Spontaneous Breathing Trials when respiratory parameters met and mental status permits -Implement VAP Bundle -Prn Bronchodilators -ABX as above  #Severe Sepsis secondary to Suprapubic Abscess and suspected CAP (present on admission) S/p Debridement x1 and wound reexploration with VAC placement -General Surgery following, appreciate input ~ wound care and VAC changes as per Surgery recommendations ~ "Recommend continued Monday Wednesday Friday wound VAC changes until less depth noted in the wound. Wound ostomy nurse and/or floor nurse to continue changes for now. Surgery will peripherally follow. Will follow-up next week if patient still in-house. 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  Labs   CBC: Recent Labs  Lab 01/16/23 2233 01/17/23 0521 01/18/23 0530 01/18/23 1032 01/19/23 0417 01/19/23 1207 01/20/23 0435 01/21/23 0500  WBC 34.2*   < > 20.7*  --  16.6* 18.1* 14.3* 13.0*  NEUTROABS 30.7*  --   --   --   --   --   --   --   HGB 10.8*   < > 7.4* 7.3* 7.2* 7.8* 7.6* 7.1*  HCT 34.8*   < > 25.7* 24.6* 24.1* 25.9* 25.5* 23.7*  MCV 88.5   < > 92.1  --  92.0 89.9 91.7 90.5  PLT 318   < > 223  --  224 243 247 240   < > = values in this interval not displayed.    Basic Metabolic Panel: Recent Labs  Lab 01/17/23 0521 01/18/23 0530 01/19/23 0414 01/19/23 1207 01/20/23 0435 01/21/23 0500  NA 155*  155* 152*  --  145 144 142  K 3.1*  3.1* 3.5  --  3.3* 4.2 3.8  CL 121*  121* 122*  --  114* 115* 114*  CO2 20*  20* 22  --  21* 20* 22  GLUCOSE 184*  187* 134*  --  126* 146* 127*  BUN 44*  47* 38*  --  38* 33* 22*  CREATININE 0.70  0.68 0.76  --  0.70 0.60 0.55  CALCIUM 7.9*  7.9* 8.3*  --  8.1* 8.0* 7.9*  MG 1.9 2.0 1.9  --  1.8 2.0  PHOS 3.0 3.8 4.3  --  3.2 2.5   GFR: Estimated Creatinine Clearance: 76 mL/min (by C-G formula based on SCr of 0.55 mg/dL). Recent Labs  Lab 01/16/23 2233 01/17/23 0521 01/18/23 0530 01/19/23 0417 01/19/23 1207 01/20/23 0435 01/21/23 0500  PROCALCITON  --  1.96  --   --   --   --   --   WBC 34.2* 40.9*   < > 16.6* 18.1* 14.3* 13.0*  LATICACIDVEN 1.6 1.3  --   --   --   --   --    < > = values in this interval not displayed.    Liver Function Tests: Recent Labs  Lab 01/16/23 2233 01/17/23 0521 01/18/23 0530  AST 33 26 26  ALT 20 18 15   ALKPHOS 133* 113 85  BILITOT 1.1 1.2 0.7  PROT 8.1 6.9 7.5  ALBUMIN 1.9* 1.6* 3.3*    No results for input(s): "LIPASE", "AMYLASE" in the last 168 hours. No results for input(s): "AMMONIA" in the last 168 hours.  ABG    Component Value Date/Time   PHART 7.38 01/17/2023 0445   PCO2ART 34 01/17/2023 0445   PO2ART 129 (H) 01/17/2023 0445   HCO3 20.1 01/17/2023 0445   ACIDBASEDEF 4.2 (H) 01/17/2023 0445   O2SAT 99.2 01/17/2023 0445     Coagulation Profile: Recent Labs  Lab 01/16/23 2233 01/17/23 0521  INR 1.5* 1.6*    Cardiac Enzymes: Recent Labs  Lab 01/17/23 0521  CKTOTAL 75    HbA1C: Hgb A1c MFr Bld  Date/Time Value Ref Range Status  01/17/2023 05:21 AM 6.9 (H) 4.8 - 5.6 % Final    Comment:    (NOTE)         Prediabetes: 5.7 - 6.4         Diabetes: >6.4         Glycemic control for adults with diabetes: <7.0   07/07/2021 03:16 AM 5.7 (H) 4.8 - 5.6 % Final    Comment:    (NOTE) Pre diabetes:          5.7%-6.4%  Diabetes:              >6.4%  Glycemic control for   <7.0% adults with diabetes     CBG: Recent Labs  Lab 01/21/23 1631 01/21/23 1939 01/21/23 2350 01/22/23 0321 01/22/23 0751  GLUCAP 120* 104* 108* 96 116*    Review of Systems:   Unable to assess due to intubation/sedation/critical illness   Past Medical History:  She,  has a past medical history of Acute kidney injury (  AKI) with acute tubular necrosis (ATN) (HCC), Acute on chronic combined systolic and diastolic CHF (congestive heart failure) ([place]) (06/06/2018), Acute on chronic respiratory failure with hypoxia ([place]) (06/06/2018), ARDS (adult respiratory distress syndrome) ([place]) (06/06/2018), Aspiration pneumonia due to gastric secretions ([place]), Diabetes mellitus ([place]), History of traumatic brain injury, Hypothyroidism, Pneumonia due to Klebsiella pneumoniae ([place]) (06/06/2018), Seizure disorder ([place]), Severe sepsis ([place]) (06/06/2018), Tracheostomy status ([place]) (06/06/2018), and Traumatic brain injury ([place]).   Surgical History:   Past Surgical History:  Procedure Laterality  Date   APPLICATION OF WOUND VAC N/A 01/19/2023   Procedure: APPLICATION OF WOUND VAC;  Surgeon: Benjamine Sprague, DO;  Location: [facility] ORS;  Service: General;  Laterality: N/A;   INCISION AND DRAINAGE OF WOUND N/A 01/17/2023   Procedure: DEBRIDEMENT Fournier gangrene;  Surgeon: Benjamine Sprague, DO;  Location: [facility] ORS;  Service: General;  Laterality: N/A;   IR REPLC GASTRO/COLONIC TUBE PERCUT W/FLUORO  07/11/2021   PEG PLACEMENT N/A 05/27/2020   Procedure: PERCUTANEOUS ENDOSCOPIC GASTROSTOMY (PEG) PLACEMENT;  Surgeon: Lucilla Lame, MD;  Location: [facility] ENDOSCOPY;  Service: Endoscopy;  Laterality: N/A;   PERCUTANEOUS ENDOSCOPIC GASTROSTOMY (PEG) REMOVAL N/A 08/29/2022   Procedure: PERCUTANEOUS ENDOSCOPIC GASTROSTOMY (PEG) REMOVAL;  Surgeon: Lucilla Lame, MD;  Location: [facility] ENDOSCOPY;  Service: Endoscopy;  Laterality: N/A;   TRACHEOSTOMY     WOUND EXPLORATION N/A 01/19/2023   Procedure: WOUND EXPLORATION;  Surgeon: Benjamine Sprague, DO;  Location: [facility] ORS;  Service: General;  Laterality: N/A;     Social History:   reports that she has never smoked. She has never used smokeless tobacco. She reports that she does not currently use alcohol. She reports that she does not currently use drugs.   Family History:  Her Family history is unknown by patient.   Allergies Allergies  Allergen Reactions   Kaopectate  [Attapulgite] Itching   Thiopental Itching   Gatifloxacin     Other reaction(s): Unknown   Ceftin [Cefuroxime]     Tolerates cefepime, ceftriaxone, ampicillin/sulbactam, piperacillin/tazobactam   Tomato      Home Medications  Prior to Admission medications   Medication Sig Start Date End Date Taking? Authorizing Provider  acetaminophen (TYLENOL) 325 MG tablet Take 650 mg by mouth every 8 (eight) hours as needed for mild pain or moderate pain.   Yes [provider]  albuterol (PROVENTIL) (2.5 MG/3ML) 0.083% nebulizer solution Take 3 mLs (2.5 mg total) by nebulization every 4 (four) hours as  needed for wheezing or shortness of breath. 07/12/21  Yes Bonnielee Haff, MD  ascorbic acid (VITAMIN C) 500 MG tablet Take 1,000 mg by mouth daily.   Yes [provider]  baclofen (LIORESAL) 10 MG tablet Take 10 mg by mouth 3 (three) times daily. 07/19/20  Yes [provider]  carbamazepine (TEGRETOL XR) 100 MG 12 hr tablet Take 100 mg by mouth 2 (two) times daily.   Yes [provider]  cefTRIAXone (ROCEPHIN) IVPB 2 g once.   Yes [provider]  Cranberry 450 MG CAPS Take 450 mg by mouth daily.   Yes [provider]  docusate (COLACE) 50 MG/5ML liquid Place 10 mLs (100 mg total) into feeding tube 2 (two) times daily. Patient taking differently: Take 100 mg by mouth 2 (two) times daily. 07/12/21  Yes Bonnielee Haff, MD  doxycycline 100 mg in sodium chloride 0.9 % 250 mL Inject 100 mg into the vein every 12 (twelve) hours.   Yes [provider]  ertapenem (INVANZ) IVPB 1 g daily. 01/15/23 01/23/23 Yes  [provider]  ferrous sulfate 325 (65 FE) MG tablet Take 325 mg by mouth every other day.   Yes [provider]  gabapentin (NEURONTIN) 100 MG capsule Take 100 mg by mouth 2 (two) times daily.   Yes [provider]  lactobacillus acidophilus & bulgar (LACTINEX) chewable tablet Chew 1 tablet by mouth 2 (two) times daily.   Yes [provider]  levothyroxine (SYNTHROID) 112 MCG tablet Take 1 tablet (112 mcg total) by mouth daily before breakfast. 07/12/21  Yes Bonnielee Haff, MD  oxyCODONE-acetaminophen (PERCOCET/ROXICET) 5-325 MG tablet Place 1 tablet into feeding tube 3 (three) times daily. Patient taking differently: Take 1 tablet by mouth 3 (three) times daily. 07/12/21  Yes Bonnielee Haff, MD  polyethylene glycol (MIRALAX / GLYCOLAX) 17 g packet Take 17 g by mouth 2 (two) times daily. 07/12/21  Yes Bonnielee Haff, MD  senna (SENOKOT) 8.6 MG TABS tablet Take 2 tablets by mouth 2 (two) times daily.   Yes [provider]  sertraline (ZOLOFT) 25 MG tablet Take 25 mg by mouth daily.   Yes [provider]  sodium chloride 0.45 % solution Inject 70 mLs into the vein continuous. 70 ml per hour for 2 liters   Yes [provider]  Water For Irrigation, Sterile (FREE WATER) SOLN Place 75 mLs into feeding tube every 4 (four) hours. 07/12/21  Yes Bonnielee Haff, MD  bisacodyl (FLEET) 10 MG/30ML ENEM Place 10 mg rectally once.    [provider]  midodrine (PROAMATINE) 10 MG tablet Place 1 tablet (10 mg total) into feeding tube 3 (three) times daily as needed (if SBP < 100). 04/30/21   Val Riles, MD  sodium polystyrene (KAYEXALATE) 15 GM/60ML suspension Take 60 g by mouth as needed.    [provider]     Critical care time: 40 minutes     Darel Hong, AGACNP-BC [facility] epic messenger for cross cover needs If after hours, please call E-link

## 2023-01-23 DIAGNOSIS — A419 Sepsis, unspecified organism: Secondary | ICD-10-CM | POA: Diagnosis not present

## 2023-01-23 DIAGNOSIS — G9341 Metabolic encephalopathy: Secondary | ICD-10-CM | POA: Diagnosis not present

## 2023-01-23 DIAGNOSIS — L03311 Cellulitis of abdominal wall: Secondary | ICD-10-CM | POA: Diagnosis not present

## 2023-01-23 DIAGNOSIS — J9601 Acute respiratory failure with hypoxia: Secondary | ICD-10-CM | POA: Diagnosis not present

## 2023-01-23 LAB — CBC WITH DIFFERENTIAL/PLATELET
Abs Immature Granulocytes: 0.17 10*3/uL — ABNORMAL HIGH (ref 0.00–0.07)
Basophils Absolute: 0 10*3/uL (ref 0.0–0.1)
Basophils Relative: 0 %
Eosinophils Absolute: 0.2 10*3/uL (ref 0.0–0.5)
Eosinophils Relative: 1 %
HCT: 25.5 % — ABNORMAL LOW (ref 36.0–46.0)
Hemoglobin: 7.6 g/dL — ABNORMAL LOW (ref 12.0–15.0)
Immature Granulocytes: 1 %
Lymphocytes Relative: 16 %
Lymphs Abs: 1.9 10*3/uL (ref 0.7–4.0)
MCH: 26.9 pg (ref 26.0–34.0)
MCHC: 29.8 g/dL — ABNORMAL LOW (ref 30.0–36.0)
MCV: 90.1 fL (ref 80.0–100.0)
Monocytes Absolute: 0.7 10*3/uL (ref 0.1–1.0)
Monocytes Relative: 6 %
Neutro Abs: 9.5 10*3/uL — ABNORMAL HIGH (ref 1.7–7.7)
Neutrophils Relative %: 76 %
Platelets: 307 10*3/uL (ref 150–400)
RBC: 2.83 MIL/uL — ABNORMAL LOW (ref 3.87–5.11)
RDW: 14.6 % (ref 11.5–15.5)
WBC: 12.5 10*3/uL — ABNORMAL HIGH (ref 4.0–10.5)
nRBC: 0 % (ref 0.0–0.2)

## 2023-01-23 LAB — AEROBIC/ANAEROBIC CULTURE W GRAM STAIN (SURGICAL/DEEP WOUND)

## 2023-01-23 LAB — PROCALCITONIN: Procalcitonin: 0.12 ng/mL

## 2023-01-23 LAB — CBC
HCT: 23.1 % — ABNORMAL LOW (ref 36.0–46.0)
Hemoglobin: 6.9 g/dL — ABNORMAL LOW (ref 12.0–15.0)
MCH: 27.1 pg (ref 26.0–34.0)
MCHC: 29.9 g/dL — ABNORMAL LOW (ref 30.0–36.0)
MCV: 90.6 fL (ref 80.0–100.0)
Platelets: 256 10*3/uL (ref 150–400)
RBC: 2.55 MIL/uL — ABNORMAL LOW (ref 3.87–5.11)
RDW: 14.6 % (ref 11.5–15.5)
WBC: 10.4 10*3/uL (ref 4.0–10.5)
nRBC: 0 % (ref 0.0–0.2)

## 2023-01-23 LAB — GLUCOSE, CAPILLARY
Glucose-Capillary: 121 mg/dL — ABNORMAL HIGH (ref 70–99)
Glucose-Capillary: 95 mg/dL (ref 70–99)
Glucose-Capillary: 130 mg/dL — ABNORMAL HIGH (ref 70–99)
Glucose-Capillary: 113 mg/dL — ABNORMAL HIGH (ref 70–99)
Glucose-Capillary: 94 mg/dL (ref 70–99)

## 2023-01-23 LAB — BASIC METABOLIC PANEL
Anion gap: 6 (ref 5–15)
BUN: 21 mg/dL — ABNORMAL HIGH (ref 6–20)
CO2: 25 mmol/L (ref 22–32)
Calcium: 8.2 mg/dL — ABNORMAL LOW (ref 8.9–10.3)
Chloride: 109 mmol/L (ref 98–111)
Creatinine, Ser: 0.55 mg/dL (ref 0.44–1.00)
GFR, Estimated: >60 mL/min (ref >60)
Glucose, Bld: 121 mg/dL — ABNORMAL HIGH (ref 70–99)
Potassium: 3.4 mmol/L — ABNORMAL LOW (ref 3.5–5.1)
Sodium: 140 mmol/L (ref 135–145)

## 2023-01-23 LAB — PREPARE RBC (CROSSMATCH)

## 2023-01-23 LAB — PHOSPHORUS: Phosphorus: 2.3 mg/dL — ABNORMAL LOW (ref 2.5–4.6)

## 2023-01-23 LAB — MAGNESIUM: Magnesium: 1.8 mg/dL (ref 1.7–2.4)

## 2023-01-23 MED ORDER — ENOXAPARIN SODIUM 40 MG/0.4ML IJ SOSY
40.0000 mg | PREFILLED_SYRINGE | INTRAMUSCULAR | Status: DC
  Administered 2023-01-23 – 2023-01-25 (×3): 40 mg via SUBCUTANEOUS
  Filled 2023-01-23 (×3): qty 0.4

## 2023-01-23 MED ORDER — SODIUM CHLORIDE 0.9% IV SOLUTION: Freq: Once | INTRAVENOUS | Status: DC

## 2023-01-23 MED ORDER — ENOXAPARIN SODIUM 40 MG/0.4ML IJ SOSY: 40.0000 mg | PREFILLED_SYRINGE | INTRAMUSCULAR | Status: DC

## 2023-01-23 NOTE — Progress Notes (Signed)
No new events today. Remained on 30% 25/5 spontaneous all day. More alert at times. Shakes her head yes and no to questions. Two baths today. Flexi seal works at times.

## 2023-01-23 NOTE — Progress Notes (Signed)
NAME:  Joanne [name], MRN:  [id number], DOB:  21-Dec-1970, LOS: 7 ADMISSION DATE:  01/16/2023, CONSULTATION DATE:  01/17/2023 REFERRING MD:  Morton Amy, NP, CHIEF COMPLAINT:  Code Sepsis   History of Present Illness:  52 year old female presenting to [facility] ED from [facility] via EMS on 01/16/2023 for evaluation of altered mental status and concern for sepsis. History provided per chart review. EMS initially called out for multiple electrolyte derangements, SNF staff was also concerned she might be septic.  They reported her potassium was elevated at 6.7 and sodium 154. patient had a chest x-ray done on 01/15/2023 outpatient which revealed pneumonia and she was started on Rocephin.  She also has a PICC line in place as well as a chronic indwelling catheter.  At baseline the patient is bed bound & nonverbal with some chronic contractures in place and a legal guardian.   ED course: Upon arrival patient febrile, tachypneic and tachycardic with marginal blood pressure.  Sepsis protocol initiated patient received IV fluid resuscitation as well as empiric antibiotics.  Her Foley catheter was exchanged and TRH consulted for admission.  Labs significant for hypernatremia, hyperkalemia, elevated chloride and BUN, Transaminitis, hypoalbuminemia, significant leukocytosis with left shift and mild anemia.  Chest x-ray concerning for atelectasis versus pneumonia and CT abdomen and pelvis concerning for Fournier's gangrene and surgery was urgently consulted.  Patient went directly to the OR for intervention.    ABG: 7.38/34/129/20.1 CXR 01/16/2023: Patchy opacities in the right lower lung atelectasis versus infection.  Stable cardiomegaly CT CT abdomen pelvis with contrast 01/17/2023: Extensive soft tissue infiltration, soft tissue gas and loculated collections in the low anterior pelvic fat extending into the genital region consistent with Fournier's gas gangrene.  Delayed appearance of contrast material in the  renal collecting systems may indicate metabolic disease.  No evidence of obstruction.  Broad-based ventral abdominal wall hernia containing small and large bowel without proximal obstruction.   During surgery it was discovered the patient did not have Fournier's gas gangrene but a suprapubic abscess which was debrided and packed.  The patient was a difficult intubation due to contractures.  Plan to return to the OR on Friday, 01/19/2023.   PCCM consulted for assistance in management and monitoring due to postoperative mechanical ventilatory support in the setting of suspected sepsis secondary to suprapubic abscess in the setting of possible CAP.  Please see "Significant Hospital Events" section below for full detailed hospital course.  Pertinent  Medical History   Past Medical History:  Diagnosis Date   Acute kidney injury (AKI) with acute tubular necrosis (ATN) (HCC)    Acute on chronic combined systolic and diastolic CHF (congestive heart failure) ([place]) 06/06/2018   Acute on chronic respiratory failure with hypoxia ([place]) 06/06/2018   ARDS (adult respiratory distress syndrome) ([place]) 06/06/2018   Aspiration pneumonia due to gastric secretions (HCC)    Diabetes mellitus ([place])    History of traumatic brain injury    Hypothyroidism    Pneumonia due to Klebsiella pneumoniae ([place]) 06/06/2018   Seizure disorder ([place])    Severe sepsis ([place]) 06/06/2018   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 coli (ESBL), Proteus mirabilis, Enterococcus avium 3/27: MRSA PCR>> negative 3/27: Urine>> no growth 3/27: Respiratory viral panel>>Negative  Antimicrobials:   Anti-infectives (From admission, onward)    Start     Dose/Rate Route Frequency Ordered  Stop   01/17/23 2200  azithromycin (ZITHROMAX) 500 mg in sodium chloride 0.9 % 250 mL IVPB  Status:  Discontinued        500 mg 250 mL/hr  over 60 Minutes Intravenous Every 24 hours 01/17/23 0016 01/17/23 1018   01/17/23 1400  piperacillin-tazobactam (ZOSYN) IVPB 3.375 g        3.375 g 12.5 mL/hr over 240 Minutes Intravenous Every 8 hours 01/17/23 1045     01/17/23 1145  linezolid (ZYVOX) IVPB 600 mg  Status:  Discontinued        600 mg 300 mL/hr over 60 Minutes Intravenous Every 12 hours 01/17/23 1045 01/22/23 1048   01/17/23 0800  azithromycin (ZITHROMAX) 500 mg in sodium chloride 0.9 % 250 mL IVPB  Status:  Discontinued        500 mg 250 mL/hr over 60 Minutes Intravenous Every 24 hours 01/16/23 2344 01/17/23 0016   01/17/23 0700  ceFEPIme (MAXIPIME) 2 g in sodium chloride 0.9 % 100 mL IVPB  Status:  Discontinued        2 g 200 mL/hr over 30 Minutes Intravenous Every 8 hours 01/17/23 0027 01/17/23 1018   01/17/23 0030  vancomycin (VANCOREADY) IVPB 1750 mg/350 mL  Status:  Discontinued        1,750 mg 175 mL/hr over 120 Minutes Intravenous Every 24 hours 01/17/23 0029 01/17/23 1018   01/16/23 2230  vancomycin (VANCOCIN) IVPB 1000 mg/200 mL premix  Status:  Discontinued        1,000 mg 200 mL/hr over 60 Minutes Intravenous  Once 01/16/23 2215 01/16/23 2219   01/16/23 2230  ceFEPIme (MAXIPIME) 2 g in sodium chloride 0.9 % 100 mL IVPB        2 g 200 mL/hr over 30 Minutes Intravenous  Once 01/16/23 2215 01/16/23 2344   01/16/23 2230  azithromycin (ZITHROMAX) 500 mg in sodium chloride 0.9 % 250 mL IVPB        500 mg 250 mL/hr over 60 Minutes Intravenous  Once 01/16/23 2215 01/17/23 0043   01/16/23 2230  vancomycin (VANCOREADY) IVPB 1750 mg/350 mL  Status:  Discontinued        1,750 mg 175 mL/hr over 120 Minutes Intravenous  Once 01/16/23 2219 01/17/23 0029        Significant Hospital Events: Including procedures, antibiotic start and stop dates in addition to other pertinent events   01/16/23: Admit to PCU for severe sepsis, 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  Post surgical intervention patient requiring mechanical ventilation and transferred to ICU. 3/27 remains on vent 3/28 remains on vent 3/29: wound vac change 3/30: did about 8 hours on SBT yesterday 3/31: only did 3.5 hours on SBT yesterday but could have gone much longer 4/1: Plan for wound vac exchange today at bedside.  Remains on minimal vent settings, will perform SBT at tolerated following wound vac change. 4/2: Awake and following simple commands, SBT as tolerated.  Transfuse 1 unit pRBC for Hgb 6.9  Interim History / Subjective:  -NO significant events noted overnight -Afebrile, hemodynamically stable, no vasopressors -On minimal vent settings, perform SBT as tolerated ~currently requiring high pressure support of 25/5 to maintain adequate tidal volumes ~will continue to try to wean pressure support as able but currently too much support to attempt extubation -Hgb has slowly trended down each day, currently 6.9 from 7.1; no signs of bleeding ~ will transfuse 1 unit pRBCs   Objective  Blood pressure (!) 109/56, pulse 75, temperature 99.2 F (37.3 C), resp. rate 15, height 5\' 2"  (1.575 m), weight 71.2 kg, SpO2 100 %.    Vent Mode: PSV FiO2 (%):  [28 %-30 %] 28 % Set Rate:  [15 bmp] 15 bmp Vt Set:  [450 mL] 450 mL PEEP:  [5 cmH20] 5 cmH20 Pressure Support:  [10 cmH20-25 cmH20] 10 cmH20   Intake/Output Summary (Last 24 hours) at 01/23/2023 0930 Last data filed at 01/23/2023 0901 Gross per 24 hour  Intake 1046.12 ml  Output 1460 ml  Net -413.88 ml    Filed Weights   01/19/23 0456 01/20/23 0500 01/21/23 0451  Weight: 70 kg 70.6 kg 71.2 kg    Examination: General: Acute on chronically ill-appearing cachectic female, on the vent and lightly sedated, no acute distress HENT: Atraumatic, normocephalic, neck contracted, orally intubated Lungs: Coarse breath sounds throughout, even, overbreathes the vent Cardiovascular: Regular rate and rhythm, S1-S2, no murmurs, rubs,  gallops Abdomen: Soft, nontender, nondistended, no guarding or rebound tenderness, bowel sounds positive x 4 Extremities: All extremities contractured with bilateral foot drop Neuro: Lightly sedated, follow commands simple commands, no focal deficits, pupils PERRLA GU: Foley catheter in place draining yellow urine  Resolved Hospital Problem list     Assessment & Plan:   #Post-Op Respiratory Failure in the setting of severe Sepsis & and suspected CAP -Full vent support, implement lung protective strategies -Plateau pressures less than 30 cm H20 -Wean FiO2 & PEEP as tolerated to maintain O2 sats >92% -Follow intermittent Chest X-ray & ABG as needed -Spontaneous Breathing Trials when respiratory parameters met and mental status permits -Implement VAP Bundle -Prn Bronchodilators -ABX as above  #Severe Sepsis secondary to Suprapubic Abscess and suspected CAP (present on admission) S/p Debridement x1 and wound reexploration with VAC placement -General Surgery following, appreciate input ~ wound care and VAC changes as per Surgery recommendations ~ "Recommend continued Monday Wednesday Friday wound VAC changes until less depth noted in the wound. Wound ostomy nurse and/or floor nurse to continue changes for now. Surgery will peripherally follow. Will follow-up next week if patient still in-house. She can follow-up in the office as outpatient in 1 week if discharged prior to" -Monitor fever curve -Trend WBC's & Procalcitonin -Follow cultures as above -Continue empiric Zosyn pending cultures & sensitivities  #Normocytic Normochromic Anemia, no s/sx of overt bleeding -Monitor for S/Sx of bleeding -Trend CBC -SCD's for VTE Prophylaxis  -Transfuse for Hgb <7  #Hyperglycemia #Hypothyroidism -CBG's q4h; Target range of 140 to 180 -SSI -Follow ICU Hypo/Hyperglycemia protocol -Continue Synthroid  #Acute Metabolic Encephalopathy #Sedation needs in setting of mechanical ventilation #Chronic  Seizure Disorder PMHx: 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  Labs   CBC: Recent Labs  Lab 01/16/23 2233 01/17/23 0521 01/19/23 0417 01/19/23 1207 01/20/23 0435 01/21/23 0500 01/23/23 0630  WBC 34.2*   < > 16.6* 18.1*  14.3* 13.0* 10.4  NEUTROABS 30.7*  --   --   --   --   --   --   HGB 10.8*   < > 7.2* 7.8* 7.6* 7.1* 6.9*  HCT 34.8*   < > 24.1* 25.9* 25.5* 23.7* 23.1*  MCV 88.5   < > 92.0 89.9 91.7 90.5 90.6  PLT 318   < > 224 243 247 240 256   < > = values in this interval not displayed.     Basic Metabolic Panel: Recent Labs  Lab 01/19/23 0414 01/19/23 1207 01/20/23 0435 01/21/23 0500 01/22/23 1427 01/23/23 0630  NA  --  145 144 142 139 140  K  --  3.3* 4.2 3.8 3.8 3.4*  CL  --  114* 115* 114* 109 109  CO2  --  21* 20* 22 21* 25  GLUCOSE  --  126* 146* 127* 106* 121*  BUN  --  38* 33* 22* 21* 21*  CREATININE  --  0.70 0.60 0.55 0.50 0.55  CALCIUM  --  8.1* 8.0* 7.9* 8.2* 8.2*  MG 1.9  --  1.8 2.0 1.7 1.8  PHOS 4.3  --  3.2 2.5 2.8 2.3*    GFR: Estimated Creatinine Clearance: 76 mL/min (by C-G formula based on SCr of 0.55 mg/dL). Recent Labs  Lab 01/16/23 2233 01/17/23 0521 01/18/23 0530 01/19/23 1207 01/20/23 0435 01/21/23 0500 01/23/23 0630  PROCALCITON  --  1.96  --   --   --   --   --   WBC 34.2* 40.9*   < > 18.1* 14.3* 13.0*  10.4  LATICACIDVEN 1.6 1.3  --   --   --   --   --    < > = values in this interval not displayed.     Liver Function Tests: Recent Labs  Lab 01/16/23 2233 01/17/23 0521 01/18/23 0530  AST 33 26 26  ALT 20 18 15   ALKPHOS 133* 113 85  BILITOT 1.1 1.2 0.7  PROT 8.1 6.9 7.5  ALBUMIN 1.9* 1.6* 3.3*    No results for input(s): "LIPASE", "AMYLASE" in the last 168 hours. No results for input(s): "AMMONIA" in the last 168 hours.  ABG    Component Value Date/Time   PHART 7.38 01/17/2023 0445   PCO2ART 34 01/17/2023 0445   PO2ART 129 (H) 01/17/2023 0445   HCO3 20.1 01/17/2023 0445   ACIDBASEDEF 4.2 (H) 01/17/2023 0445   O2SAT 99.2 01/17/2023 0445     Coagulation Profile: Recent Labs  Lab 01/16/23 2233 01/17/23 0521  INR 1.5* 1.6*     Cardiac Enzymes: Recent Labs  Lab 01/17/23 0521  CKTOTAL 75     HbA1C: Hgb A1c MFr Bld  Date/Time Value Ref Range Status  01/17/2023 05:21 AM 6.9 (H) 4.8 - 5.6 % Final    Comment:    (NOTE)         Prediabetes: 5.7 - 6.4         Diabetes: >6.4         Glycemic control for adults with diabetes: <7.0   07/07/2021 03:16 AM 5.7 (H) 4.8 - 5.6 % Final    Comment:    (NOTE) Pre diabetes:          5.7%-6.4%  Diabetes:              >6.4%  Glycemic control for   <7.0% adults with diabetes     CBG: Recent Labs  Lab 01/22/23 1936 01/22/23 2157 01/22/23 2339  01/23/23 0325 01/23/23 0751  GLUCAP 121* 110* 108* 121* 95     Review of Systems:   Unable to assess due to intubation/sedation/critical illness   Past Medical History:  She,  has a past medical history of Acute kidney injury (AKI) with acute tubular necrosis (ATN) ([place]), Acute on chronic combined systolic and diastolic CHF (congestive heart failure) ([place]) (06/06/2018), Acute on chronic respiratory failure with hypoxia ([place]) (06/06/2018), ARDS (adult respiratory distress syndrome) ([place]) (06/06/2018), Aspiration pneumonia due to gastric secretions ([place]), Diabetes  mellitus ([place]), History of traumatic brain injury, Hypothyroidism, Pneumonia due to Klebsiella pneumoniae ([place]) (06/06/2018), Seizure disorder ([place]), Severe sepsis ([place]) (06/06/2018), Tracheostomy status ([place]) (06/06/2018), and Traumatic brain injury ([place]).   Surgical History:   Past Surgical History:  Procedure Laterality Date   APPLICATION OF WOUND VAC N/A 01/19/2023   Procedure: APPLICATION OF WOUND VAC;  Surgeon: Benjamine Sprague, DO;  Location: [facility] ORS;  Service: General;  Laterality: N/A;   INCISION AND DRAINAGE OF WOUND N/A 01/17/2023   Procedure: DEBRIDEMENT Fournier gangrene;  Surgeon: Benjamine Sprague, DO;  Location: [facility] ORS;  Service: General;  Laterality: N/A;   IR REPLC GASTRO/COLONIC TUBE PERCUT W/FLUORO  07/11/2021   PEG PLACEMENT N/A 05/27/2020   Procedure: PERCUTANEOUS ENDOSCOPIC GASTROSTOMY (PEG) PLACEMENT;  Surgeon: Lucilla Lame, MD;  Location: [facility] ENDOSCOPY;  Service: Endoscopy;  Laterality: N/A;   PERCUTANEOUS ENDOSCOPIC GASTROSTOMY (PEG) REMOVAL N/A 08/29/2022   Procedure: PERCUTANEOUS ENDOSCOPIC GASTROSTOMY (PEG) REMOVAL;  Surgeon: Lucilla Lame, MD;  Location: [facility] ENDOSCOPY;  Service: Endoscopy;  Laterality: N/A;   TRACHEOSTOMY     WOUND EXPLORATION N/A 01/19/2023   Procedure: WOUND EXPLORATION;  Surgeon: Benjamine Sprague, DO;  Location: [facility] ORS;  Service: General;  Laterality: N/A;     Social History:   reports that she has never smoked. She has never used smokeless tobacco. She reports that she does not currently use alcohol. She reports that she does not currently use drugs.   Family History:  Her Family history is unknown by patient.   Allergies Allergies  Allergen Reactions   Kaopectate  [Attapulgite] Itching   Thiopental Itching   Gatifloxacin     Other reaction(s): Unknown   Ceftin [Cefuroxime]     Tolerates cefepime, ceftriaxone, ampicillin/sulbactam, piperacillin/tazobactam   Tomato      Home Medications  Prior to Admission medications   Medication Sig Start  Date End Date Taking? Authorizing Provider  acetaminophen (TYLENOL) 325 MG tablet Take 650 mg by mouth every 8 (eight) hours as needed for mild pain or moderate pain.   Yes [provider]  albuterol (PROVENTIL) (2.5 MG/3ML) 0.083% nebulizer solution Take 3 mLs (2.5 mg total) by nebulization every 4 (four) hours as needed for wheezing or shortness of breath. 07/12/21  Yes Bonnielee Haff, MD  ascorbic acid (VITAMIN C) 500 MG tablet Take 1,000 mg by mouth daily.   Yes [provider]  baclofen (LIORESAL) 10 MG tablet Take 10 mg by mouth 3 (three) times daily. 07/19/20  Yes [provider]  carbamazepine (TEGRETOL XR) 100 MG 12 hr tablet Take 100 mg by mouth 2 (two) times daily.   Yes [provider]  cefTRIAXone (ROCEPHIN) IVPB 2 g once.   Yes [provider]  Cranberry 450 MG CAPS Take 450 mg by mouth daily.   Yes [provider]  docusate (COLACE) 50 MG/5ML liquid Place 10 mLs (100 mg total) into feeding tube 2 (two) times daily. Patient taking differently: Take 100 mg by mouth 2 (two) times  daily. 07/12/21  Yes Bonnielee Haff, MD  doxycycline 100 mg in sodium chloride 0.9 % 250 mL Inject 100 mg into the vein every 12 (twelve) hours.   Yes [provider]  ertapenem (INVANZ) IVPB 1 g daily. 01/15/23 01/23/23 Yes [provider]  ferrous sulfate 325 (65 FE) MG tablet Take 325 mg by mouth every other day.   Yes [provider]  gabapentin (NEURONTIN) 100 MG capsule Take 100 mg by mouth 2 (two) times daily.   Yes [provider]  lactobacillus acidophilus & bulgar (LACTINEX) chewable tablet Chew 1 tablet by mouth 2 (two) times daily.   Yes [provider]  levothyroxine (SYNTHROID) 112 MCG tablet Take 1 tablet (112 mcg total) by mouth daily before breakfast. 07/12/21  Yes Bonnielee Haff, MD  oxyCODONE-acetaminophen (PERCOCET/ROXICET) 5-325 MG tablet Place 1 tablet into feeding tube 3 (three) times  daily. Patient taking differently: Take 1 tablet by mouth 3 (three) times daily. 07/12/21  Yes Bonnielee Haff, MD  polyethylene glycol (MIRALAX / GLYCOLAX) 17 g packet Take 17 g by mouth 2 (two) times daily. 07/12/21  Yes Bonnielee Haff, MD  senna (SENOKOT) 8.6 MG TABS tablet Take 2 tablets by mouth 2 (two) times daily.   Yes [provider]  sertraline (ZOLOFT) 25 MG tablet Take 25 mg by mouth daily.   Yes [provider]  sodium chloride 0.45 % solution Inject 70 mLs into the vein continuous. 70 ml per hour for 2 liters   Yes [provider]  Water For Irrigation, Sterile (FREE WATER) SOLN Place 75 mLs into feeding tube every 4 (four) hours. 07/12/21  Yes Bonnielee Haff, MD  bisacodyl (FLEET) 10 MG/30ML ENEM Place 10 mg rectally once.    [provider]  midodrine (PROAMATINE) 10 MG tablet Place 1 tablet (10 mg total) into feeding tube 3 (three) times daily as needed (if SBP < 100). 04/30/21   Val Riles, MD  sodium polystyrene (KAYEXALATE) 15 GM/60ML suspension Take 60 g by mouth as needed.    [provider]     Critical care time: 40 minutes     Darel Hong, AGACNP-BC [facility] epic messenger for cross cover needs If after hours, please call E-link

## 2023-01-23 NOTE — TOC Initial Note (Signed)
Transition of Care [facility]) - Initial/Assessment Note    Patient Details  Name: Joanne [name] MRN: [id number] Date of Birth: 10/03/1971  [facility]) CM/SW Contact:    Tiburcio Bash, LCSW Phone Number: 01/23/2023, 2:02 PM  Clinical Narrative:                  CSW notes patient is from [facility]. Patient has a hx of TBI, chronic contractures who presented with toxic metabolic encephalopathy and septic shock secondary to suprapubic abscess.   Patient  underwent abscess drainage and wound VAC placement complicated by post-op respiratory failure and failure to wean off the ventilator   Patient is currently off sedation, intubated, wound vac exchange MWF  Aunt [name] is patient's POA.   Palliative following, TOC continues to follow for dispo planning.   Expected Discharge Plan:  (TBD) Barriers to Discharge: Continued Medical Work up   Patient Goals and CMS Choice            Expected Discharge Plan and Services                                              Prior Living Arrangements/Services                       Activities of Daily Living      Permission Sought/Granted                  Emotional Assessment              Admission diagnosis:  Pneumonia [J18.9] Sepsis, due to unspecified organism, unspecified whether acute organ dysfunction present [A41.9] Patient Active Problem List   Diagnosis Date Noted   Abdominal wall cellulitis 01/17/2023   Abscess 01/17/2023   Pneumonia 01/16/2023   Sepsis 01/16/2023   Leukocytosis 01/16/2023   HCAP (healthcare-associated pneumonia) 07/04/2021   Aspiration pneumonia 07/04/2021   Chronic diastolic CHF (congestive heart failure) [id number]   Acute metabolic encephalopathy [id number]   Hyperkalemia 07/04/2021   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   Wound, open, in back and left leg 07/04/2021   Chronic indwelling Foley catheter    Leaking PEG  tube    Failure to thrive in adult 04/24/2021   Requires assistance with all daily activities 04/24/2021   G tube feedings 04/24/2021   Closed injury of head 09/06/2020   Seizure disorder 09/06/2020   Vitamin D deficiency 09/06/2020   Mild malnutrition    Protein-calorie malnutrition, severe 05/20/2020   Weakness    Full code status    Hypokalemia 05/16/2020   Severe sepsis with septic shock 05/15/2020   Sacral decubitus ulcer, stage IV    Goals of care, counseling/discussion    Palliative care by specialist    DNR (do not resuscitate) discussion    Septic shock 03/02/2020   Hypernatremia 11/15/2019   Thrombocytopenia 11/15/2019   AKI (acute kidney injury) 11/15/2019   HTN (hypertension) 11/15/2019   Non-insulin dependent type 2 diabetes mellitus 11/15/2019   Hypothyroidism 11/15/2019   Severe sepsis with acute organ dysfunction 11/15/2019   Left hemiplegia 11/15/2019   Pressure injury of skin 11/15/2019   Aspiration pneumonia due to gastric secretions    Acute kidney injury (AKI) with acute tubular necrosis (ATN)    Traumatic brain injury    Abdominal pain 08/05/2019  Vomiting 08/05/2019   Acute on chronic respiratory failure with hypoxia 06/06/2018   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   Pneumonia due to Klebsiella pneumoniae 06/06/2018   Acute on chronic combined systolic and diastolic CHF (congestive heart failure) 06/06/2018   Sepsis secondary to UTI 06/06/2018   ARDS (adult respiratory distress syndrome) 06/06/2018   Acute respiratory failure with hypoxia 06/06/2018   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   Iron deficiency anemia 05/20/2018   Gross hematuria 01/18/2018   Urinary retention 01/18/2018   Acute respiratory distress 10/23/2012   Cardiorespiratory arrest 10/23/2012   Acute blood loss anemia 10/20/2012   Hypophosphatemia 10/20/2012   Leukopenia 10/20/2012   Altered mental status 10/19/2012   Duodenal obstruction 10/19/2012   PCP:  Townsend [name], MD Pharmacy:    [facility], [place] [id number] [place] [address] Phone: [phone] Fax: [phone]     Social Determinants of Health (SDOH) Social History: SDOH Screenings   Tobacco Use: Low Risk  (01/19/2023)   SDOH Interventions:     Readmission Risk Interventions     No data to display

## 2023-01-24 ENCOUNTER — Inpatient Hospital Stay: Payer: Medicare Other

## 2023-01-24 DIAGNOSIS — J9601 Acute respiratory failure with hypoxia: Secondary | ICD-10-CM | POA: Diagnosis not present

## 2023-01-24 DIAGNOSIS — G9341 Metabolic encephalopathy: Secondary | ICD-10-CM | POA: Diagnosis not present

## 2023-01-24 DIAGNOSIS — L03311 Cellulitis of abdominal wall: Secondary | ICD-10-CM | POA: Diagnosis not present

## 2023-01-24 DIAGNOSIS — A419 Sepsis, unspecified organism: Secondary | ICD-10-CM | POA: Diagnosis not present

## 2023-01-24 LAB — BASIC METABOLIC PANEL
Anion gap: 6 (ref 5–15)
BUN: 21 mg/dL — ABNORMAL HIGH (ref 6–20)
CO2: 27 mmol/L (ref 22–32)
Calcium: 8.4 mg/dL — ABNORMAL LOW (ref 8.9–10.3)
Chloride: 109 mmol/L (ref 98–111)
Creatinine, Ser: 0.48 mg/dL (ref 0.44–1.00)
GFR, Estimated: >60 mL/min (ref >60)
Glucose, Bld: 103 mg/dL — ABNORMAL HIGH (ref 70–99)
Potassium: 3.3 mmol/L — ABNORMAL LOW (ref 3.5–5.1)
Sodium: 142 mmol/L (ref 135–145)

## 2023-01-24 LAB — GLUCOSE, CAPILLARY
Glucose-Capillary: 115 mg/dL — ABNORMAL HIGH (ref 70–99)
Glucose-Capillary: 128 mg/dL — ABNORMAL HIGH (ref 70–99)
Glucose-Capillary: 97 mg/dL (ref 70–99)
Glucose-Capillary: 96 mg/dL (ref 70–99)
Glucose-Capillary: 90 mg/dL (ref 70–99)

## 2023-01-24 LAB — CBC
HCT: 24.2 % — ABNORMAL LOW (ref 36.0–46.0)
Hemoglobin: 7.3 g/dL — ABNORMAL LOW (ref 12.0–15.0)
MCH: 26.8 pg (ref 26.0–34.0)
MCHC: 30.2 g/dL (ref 30.0–36.0)
MCV: 89 fL (ref 80.0–100.0)
Platelets: 318 10*3/uL (ref 150–400)
RBC: 2.72 MIL/uL — ABNORMAL LOW (ref 3.87–5.11)
RDW: 14.7 % (ref 11.5–15.5)
WBC: 11.5 10*3/uL — ABNORMAL HIGH (ref 4.0–10.5)
nRBC: 0 % (ref 0.0–0.2)

## 2023-01-24 LAB — MAGNESIUM
Magnesium: 1.7 mg/dL (ref 1.7–2.4)
Magnesium: 1.9 mg/dL (ref 1.7–2.4)

## 2023-01-24 LAB — PHOSPHORUS: Phosphorus: 2.5 mg/dL (ref 2.5–4.6)

## 2023-01-24 LAB — POTASSIUM: Potassium: 3.8 mmol/L (ref 3.5–5.1)

## 2023-01-24 MED ORDER — PROSOURCE TF20 ENFIT COMPATIBL EN LIQD
60.0000 mL | Freq: Every day | ENTERAL | Status: AC
  Administered 2023-01-25 – 2023-01-29 (×5): 60 mL
  Filled 2023-01-24 (×5): qty 60

## 2023-01-24 MED ORDER — POTASSIUM CHLORIDE 20 MEQ PO PACK
20.0000 meq | PACK | ORAL | Status: AC
  Administered 2023-01-24 (×2): 20 meq
  Filled 2023-01-24 (×2): qty 1

## 2023-01-24 MED ORDER — DOCUSATE SODIUM 50 MG/5ML PO LIQD: 100.0000 mg | Freq: Two times a day (BID) | ORAL | Status: DC | PRN

## 2023-01-24 MED ORDER — FREE WATER
100.0000 mL | Status: DC
  Administered 2023-01-24 – 2023-01-31 (×36): 100 mL

## 2023-01-24 MED ORDER — MAGNESIUM SULFATE 2 GM/50ML IV SOLN
2.0000 g | Freq: Once | INTRAVENOUS | Status: AC
  Administered 2023-01-24: 2 g via INTRAVENOUS
  Filled 2023-01-24: qty 50

## 2023-01-24 MED ORDER — VITAMIN C 500 MG PO TABS
500.0000 mg | ORAL_TABLET | Freq: Two times a day (BID) | ORAL | Status: DC
  Administered 2023-01-24 – 2023-02-09 (×32): 500 mg
  Filled 2023-01-24 (×32): qty 1

## 2023-01-24 MED ORDER — OSMOLITE 1.5 CAL PO LIQD
1000.0000 mL | ORAL | Status: AC
  Administered 2023-01-24: 1000 mL

## 2023-01-24 NOTE — Consult Note (Signed)
[facility] Nurse Consult Note:s/p wound exploration and NPWT placement site of former suprapubic abscess 01/19/2023; first NPWT dressing change performed by surgeon 01/22/2023 Reason for Consult:NPWT dressing change  Wound type:surgical  Pressure Injury POA: NA  Measurement: per surgeon measurements 10 cms x 5.7 cm x 7.7 cm deep  Wound bed:75% pink moist 25% yellow fibrin/subcutaneous tissue  Drainage (amount, consistency, odor) minimal serosanguinous  Periwound: intact Dressing procedure/placement/frequency: Removed old NPWT dressing Cleansed wound with normal saline Filled wound with 1 piece of black foam  Sealed NPWT dressing at 157mm HG Patient received IV pain medication per bedside nurse prior to dressing change Patient tolerated procedure fair  WOC nurse will continue to provide NPWT M-W-F dressing changed due to the complexity of the dressing change. Next dressing change Friday 01/26/2023.   Thank you,    Shelton Silvas MSN, RN-BC, [employer] [phone]
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
## 2023-01-24 NOTE — Progress Notes (Signed)
Pt extubated to [facility] per MD order. Pt tol well. Will continue to monitor closely

## 2023-01-24 NOTE — Progress Notes (Signed)
NAME:  Joanne [name], MRN:  [id number], DOB:  07/29/1971, LOS: 8 ADMISSION DATE:  01/16/2023, CONSULTATION DATE:  01/17/2023 REFERRING MD:  Morton Amy, NP, CHIEF COMPLAINT:  Code Sepsis    History of Present Illness:  52 year old female presenting to [facility] ED from [facility] via EMS on 01/16/2023 for evaluation of altered mental status and concern for sepsis. History provided per chart review. EMS initially called out for multiple electrolyte derangements, SNF staff was also concerned she might be septic.  They reported her potassium was elevated at 6.7 and sodium 154. patient had a chest x-ray done on 01/15/2023 outpatient which revealed pneumonia and she was started on Rocephin.  She also has a PICC line in place as well as a chronic indwelling catheter.  At baseline the patient is bed bound & nonverbal with some chronic contractures in place and a legal guardian.   ED course: Upon arrival patient febrile, tachypneic and tachycardic with marginal blood pressure.  Sepsis protocol initiated patient received IV fluid resuscitation as well as empiric antibiotics.  Her Foley catheter was exchanged and TRH consulted for admission.  Labs significant for hypernatremia, hyperkalemia, elevated chloride and BUN, Transaminitis, hypoalbuminemia, significant leukocytosis with left shift and mild anemia.  Chest x-ray concerning for atelectasis versus pneumonia and CT abdomen and pelvis concerning for Fournier's gangrene and surgery was urgently consulted.  Patient went directly to the OR for intervention.    ABG: 7.38/34/129/20.1 CXR 01/16/2023: Patchy opacities in the right lower lung atelectasis versus infection.  Stable cardiomegaly CT CT abdomen pelvis with contrast 01/17/2023: Extensive soft tissue infiltration, soft tissue gas and loculated collections in the low anterior pelvic fat extending into the genital region consistent with Fournier's gas gangrene.  Delayed appearance of contrast material in the  renal collecting systems may indicate metabolic disease.  No evidence of obstruction.  Broad-based ventral abdominal wall hernia containing small and large bowel without proximal obstruction.   During surgery it was discovered the patient did not have Fournier's gas gangrene but a suprapubic abscess which was debrided and packed.  The patient was a difficult intubation due to contractures.  Plan to return to the OR on Friday, 01/19/2023.   PCCM consulted for assistance in management and monitoring due to postoperative mechanical ventilatory support in the setting of suspected sepsis secondary to suprapubic abscess in the setting of possible CAP.  Please see "Significant Hospital Events" section below for full detailed hospital course.  Pertinent  Medical History   Past Medical History:  Diagnosis Date   Acute kidney injury (AKI) with acute tubular necrosis (ATN) (HCC)    Acute on chronic combined systolic and diastolic CHF (congestive heart failure) ([place]) 06/06/2018   Acute on chronic respiratory failure with hypoxia ([place]) 06/06/2018   ARDS (adult respiratory distress syndrome) ([place]) 06/06/2018   Aspiration pneumonia due to gastric secretions (HCC)    Diabetes mellitus ([place])    History of traumatic brain injury    Hypothyroidism    Pneumonia due to Klebsiella pneumoniae ([place]) 06/06/2018   Seizure disorder ([place])    Severe sepsis ([place]) 06/06/2018   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 coli (ESBL), Proteus mirabilis, Enterococcus avium 3/27: MRSA PCR>> negative 3/27: Urine>> no growth 3/27: Respiratory viral panel>>Negative  Antimicrobials:   Anti-infectives (From admission, onward)    Start     Dose/Rate Route Frequency  Ordered Stop   01/17/23 2200  azithromycin (ZITHROMAX) 500 mg in sodium chloride 0.9 % 250 mL IVPB  Status:  Discontinued        500 mg 250 mL/hr  over 60 Minutes Intravenous Every 24 hours 01/17/23 0016 01/17/23 1018   01/17/23 1400  piperacillin-tazobactam (ZOSYN) IVPB 3.375 g        3.375 g 12.5 mL/hr over 240 Minutes Intravenous Every 8 hours 01/17/23 1045     01/17/23 1145  linezolid (ZYVOX) IVPB 600 mg  Status:  Discontinued        600 mg 300 mL/hr over 60 Minutes Intravenous Every 12 hours 01/17/23 1045 01/22/23 1048   01/17/23 0800  azithromycin (ZITHROMAX) 500 mg in sodium chloride 0.9 % 250 mL IVPB  Status:  Discontinued        500 mg 250 mL/hr over 60 Minutes Intravenous Every 24 hours 01/16/23 2344 01/17/23 0016   01/17/23 0700  ceFEPIme (MAXIPIME) 2 g in sodium chloride 0.9 % 100 mL IVPB  Status:  Discontinued        2 g 200 mL/hr over 30 Minutes Intravenous Every 8 hours 01/17/23 0027 01/17/23 1018   01/17/23 0030  vancomycin (VANCOREADY) IVPB 1750 mg/350 mL  Status:  Discontinued        1,750 mg 175 mL/hr over 120 Minutes Intravenous Every 24 hours 01/17/23 0029 01/17/23 1018   01/16/23 2230  vancomycin (VANCOCIN) IVPB 1000 mg/200 mL premix  Status:  Discontinued        1,000 mg 200 mL/hr over 60 Minutes Intravenous  Once 01/16/23 2215 01/16/23 2219   01/16/23 2230  ceFEPIme (MAXIPIME) 2 g in sodium chloride 0.9 % 100 mL IVPB        2 g 200 mL/hr over 30 Minutes Intravenous  Once 01/16/23 2215 01/16/23 2344   01/16/23 2230  azithromycin (ZITHROMAX) 500 mg in sodium chloride 0.9 % 250 mL IVPB        500 mg 250 mL/hr over 60 Minutes Intravenous  Once 01/16/23 2215 01/17/23 0043   01/16/23 2230  vancomycin (VANCOREADY) IVPB 1750 mg/350 mL  Status:  Discontinued        1,750 mg 175 mL/hr over 120 Minutes Intravenous  Once 01/16/23 2219 01/17/23 0029      Significant Hospital Events: Including procedures, antibiotic start and stop dates in addition to other pertinent events   01/16/23: Admit to PCU for severe sepsis, 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  Post surgical intervention patient requiring mechanical ventilation and transferred to ICU. 3/27 remains on vent 3/28 remains on vent 3/29: wound vac change 3/30: did about 8 hours on SBT yesterday 3/31: only did 3.5 hours on SBT yesterday but could have gone much longer 4/1: Plan for wound vac exchange today at bedside.  Remains on minimal vent settings, will perform SBT at tolerated following wound vac change. 4/3: Pt tolerating SBT 5/5 plans for possible extubation   Interim History / Subjective:  No acute events overnight   Objective   Blood pressure 122/74, pulse (!) 108, temperature 97.7 F (36.5 C), resp. rate (!) 27, height 5\' 2"  (1.575 m), weight 71.2 kg, SpO2 99 %.    Vent Mode: PSV FiO2 (%):  [28 %-30 %] 30 % Set Rate:  [20 bmp] 20 bmp Vt Set:  [350 mL] 350 mL PEEP:  [5 cmH20] 5 cmH20 Pressure Support:  [5 cmH20] 5 cmH20 Plateau Pressure:  [18  cmH20] 18 cmH20   Intake/Output Summary (Last 24 hours) at 01/24/2023 1134 Last data filed at 01/24/2023 1031 Gross per 24 hour  Intake 2568.43 ml  Output 2380 ml  Net 188.43 ml   Filed Weights   01/19/23 0456 01/20/23 0500 01/21/23 0451  Weight: 70 kg 70.6 kg 71.2 kg   Examination: General: Acute on chronically ill-appearing cachectic female, NAD mechanically intubated  HEENT: Atraumatic, normocephalic, neck contracted, orally intubated Lungs: Rhonchi throughout, even, non labored  Cardiovascular: Regular rate and rhythm, S1-S2, no murmurs, rubs, gallops Abdomen: Soft, nontender, nondistended, no guarding or rebound tenderness, bowel sounds positive x 4 Extremities: All extremities contractured with bilateral foot drop Neuro: Lightly sedated, unable to follow commands, withdraws from stimulation and opens eyes spontaneously, PERRL GU: Indwelling foley catheter in place draining yellow urine  Resolved Hospital Problem list     Assessment & Plan:   #Post-Op Respiratory Failure in the setting of severe Sepsis &  and suspected CAP #Mechanical intubation  - Full vent support for now: vent settings reviewed and established  - Continue lung protective strategies - Plateau pressures less than 30 cm H20 - Wean FiO2 & PEEP as tolerated to maintain O2 sats >92% - Follow intermittent Chest X-ray & ABG as needed - Spontaneous Breathing Trials when respiratory parameters met and mental status permits - VAP Bundle implemented  - ABX as outlined above  #Severe Sepsis secondary to suprapubic abscess s/p I&D (wound culture positive: for ecoli and enterococcus) and suspected CAP (present on admission) S/p Debridement x1 and wound reexploration with VAC placement -General Surgery following, appreciate input ~ wound care and VAC changes as per Surgery recommendations ~ "Recommend continued Monday Wednesday Friday wound VAC changes until less depth noted in the wound. Wound ostomy nurse and/or floor nurse to continue changes for now. Surgery will peripherally follow. Will follow-up next week if patient still in-house. She can follow-up in the office as outpatient in 1 week if discharged prior to" - Trend WBC and monitor fever curve  - Trend PCT  - Continue empiric Zosyn pending cultures & sensitivities - Dressing changes per General Surgery recommendations   #Hypokalemia  #Hypomagnesia  - Trend BMP  - Replace electrolytes as indicated  - Monitor UOP   #Hyperglycemia #Hypothyroidism - CBG's q4h; Target range of 140 to 180 - SSI - Follow ICU Hypo/Hyperglycemia protocol - Continue Synthroid  #Acute Metabolic Encephalopathy #Sedation needs in setting of mechanical ventilation #Chronic Seizure Disorder PMHx: TBI with chronic contractures and chronic pressure injuries (present on admission) - Maintain a RASS goal of 0 to -1 - Fentanyl as needed to maintain RASS goal - Avoid sedating medications as able - Daily wake up assessment - Continue baclofen - Turn Q2h, ROM Q4, utilize offloading devices   Best  Practice (right click and "Reselect all SmartList Selections" daily)   Diet/type: tubefeeds and NPO DVT prophylaxis: SCD GI prophylaxis: H2B Lines: Central line Foley:  Yes, and it is still needed Code Status:  full code Last date of multidisciplinary goals of care discussion [01/24/23]  4/3: Will update pt's family when they arrive at bedside. Labs   CBC: Recent Labs  Lab 01/20/23 0435 01/21/23 0500 01/23/23 0630 01/23/23 1525 01/24/23 0620  WBC 14.3* 13.0* 10.4 12.5* 11.5*  NEUTROABS  --   --   --  9.5*  --   HGB 7.6* 7.1* 6.9* 7.6* 7.3*  HCT 25.5* 23.7* 23.1* 25.5* 24.2*  MCV 91.7 90.5 90.6 90.1 89.0  PLT 247 240 256 307  0000000    Basic Metabolic Panel: Recent Labs  Lab 01/20/23 0435 01/21/23 0500 01/22/23 1427 01/23/23 0630 01/24/23 0620  NA 144 142 139 140 142  K 4.2 3.8 3.8 3.4* 3.3*  CL 115* 114* 109 109 109  CO2 20* 22 21* 25 27  GLUCOSE 146* 127* 106* 121* 103*  BUN 33* 22* 21* 21* 21*  CREATININE 0.60 0.55 0.50 0.55 0.48  CALCIUM 8.0* 7.9* 8.2* 8.2* 8.4*  MG 1.8 2.0 1.7 1.8 1.7  PHOS 3.2 2.5 2.8 2.3* 2.5   GFR: Estimated Creatinine Clearance: 76 mL/min (by C-G formula based on SCr of 0.48 mg/dL). Recent Labs  Lab 01/21/23 0500 01/23/23 0630 01/23/23 1525 01/24/23 0620  PROCALCITON  --   --  0.12  --   WBC 13.0* 10.4 12.5* 11.5*    Liver Function Tests: Recent Labs  Lab 01/18/23 0530  AST 26  ALT 15  ALKPHOS 85  BILITOT 0.7  PROT 7.5  ALBUMIN 3.3*   No results for input(s): "LIPASE", "AMYLASE" in the last 168 hours. No results for input(s): "AMMONIA" in the last 168 hours.  ABG    Component Value Date/Time   PHART 7.38 01/17/2023 0445   PCO2ART 34 01/17/2023 0445   PO2ART 129 (H) 01/17/2023 0445   HCO3 20.1 01/17/2023 0445   ACIDBASEDEF 4.2 (H) 01/17/2023 0445   O2SAT 99.2 01/17/2023 0445     Coagulation Profile: No results for input(s): "INR", "PROTIME" in the last 168 hours.   Cardiac Enzymes: No results for input(s):  "CKTOTAL", "CKMB", "CKMBINDEX", "TROPONINI" in the last 168 hours.   HbA1C: Hgb A1c MFr Bld  Date/Time Value Ref Range Status  01/17/2023 05:21 AM 6.9 (H) 4.8 - 5.6 % Final    Comment:    (NOTE)         Prediabetes: 5.7 - 6.4         Diabetes: >6.4         Glycemic control for adults with diabetes: <7.0   07/07/2021 03:16 AM 5.7 (H) 4.8 - 5.6 % Final    Comment:    (NOTE) Pre diabetes:          5.7%-6.4%  Diabetes:              >6.4%  Glycemic control for   <7.0% adults with diabetes     CBG: Recent Labs  Lab 01/23/23 1259 01/23/23 1616 01/23/23 1944 01/24/23 0329 01/24/23 0811  GLUCAP 130* 113* 94 115* 128*    Review of Systems:   Unable to assess due to intubation/sedation/critical illness   Past Medical History:  She,  has a past medical history of Acute kidney injury (AKI) with acute tubular necrosis (ATN) ([place]), Acute on chronic combined systolic and diastolic CHF (congestive heart failure) ([place]) (06/06/2018), Acute on chronic respiratory failure with hypoxia (HCC) (06/06/2018), ARDS (adult respiratory distress syndrome) (HCC) (06/06/2018), Aspiration pneumonia due to gastric secretions ([place]), Diabetes mellitus ([place]), History of traumatic brain injury, Hypothyroidism, Pneumonia due to Klebsiella pneumoniae ([place]) (06/06/2018), Seizure disorder ([place]), Severe sepsis ([place]) (06/06/2018), Tracheostomy status ([place]) (06/06/2018), and Traumatic brain injury ([place]).   Surgical History:   Past Surgical History:  Procedure Laterality Date   APPLICATION OF WOUND VAC N/A 01/19/2023   Procedure: APPLICATION OF WOUND VAC;  Surgeon: Benjamine Sprague, DO;  Location: [facility] ORS;  Service: General;  Laterality: N/A;   INCISION AND DRAINAGE OF WOUND N/A 01/17/2023   Procedure: DEBRIDEMENT Fournier gangrene;  Surgeon: Benjamine Sprague, DO;  Location: [facility] ORS;  Service: General;  Laterality: N/A;   IR REPLC GASTRO/COLONIC TUBE PERCUT W/FLUORO  07/11/2021   PEG PLACEMENT N/A 05/27/2020   Procedure:  PERCUTANEOUS ENDOSCOPIC GASTROSTOMY (PEG) PLACEMENT;  Surgeon: Lucilla Lame, MD;  Location: [facility] ENDOSCOPY;  Service: Endoscopy;  Laterality: N/A;   PERCUTANEOUS ENDOSCOPIC GASTROSTOMY (PEG) REMOVAL N/A 08/29/2022   Procedure: PERCUTANEOUS ENDOSCOPIC GASTROSTOMY (PEG) REMOVAL;  Surgeon: Lucilla Lame, MD;  Location: [facility] ENDOSCOPY;  Service: Endoscopy;  Laterality: N/A;   TRACHEOSTOMY     WOUND EXPLORATION N/A 01/19/2023   Procedure: WOUND EXPLORATION;  Surgeon: Benjamine Sprague, DO;  Location: [facility] ORS;  Service: General;  Laterality: N/A;     Social History:   reports that she has never smoked. She has never used smokeless tobacco. She reports that she does not currently use alcohol. She reports that she does not currently use drugs.   Family History:  Her Family history is unknown by patient.   Allergies Allergies  Allergen Reactions   Kaopectate  [Attapulgite] Itching   Thiopental Itching   Gatifloxacin     Other reaction(s): Unknown   Ceftin [Cefuroxime]     Tolerates cefepime, ceftriaxone, ampicillin/sulbactam, piperacillin/tazobactam   Tomato      Home Medications  Prior to Admission medications   Medication Sig Start Date End Date Taking? Authorizing Provider  acetaminophen (TYLENOL) 325 MG tablet Take 650 mg by mouth every 8 (eight) hours as needed for mild pain or moderate pain.   Yes [provider]  albuterol (PROVENTIL) (2.5 MG/3ML) 0.083% nebulizer solution Take 3 mLs (2.5 mg total) by nebulization every 4 (four) hours as needed for wheezing or shortness of breath. 07/12/21  Yes Bonnielee Haff, MD  ascorbic acid (VITAMIN C) 500 MG tablet Take 1,000 mg by mouth daily.   Yes [provider]  baclofen (LIORESAL) 10 MG tablet Take 10 mg by mouth 3 (three) times daily. 07/19/20  Yes [provider]  carbamazepine (TEGRETOL XR) 100 MG 12 hr tablet Take 100 mg by mouth 2 (two) times daily.   Yes [provider]  cefTRIAXone (ROCEPHIN) IVPB 2 g  once.   Yes [provider]  Cranberry 450 MG CAPS Take 450 mg by mouth daily.   Yes [provider]  docusate (COLACE) 50 MG/5ML liquid Place 10 mLs (100 mg total) into feeding tube 2 (two) times daily. Patient taking differently: Take 100 mg by mouth 2 (two) times daily. 07/12/21  Yes Bonnielee Haff, MD  doxycycline 100 mg in sodium chloride 0.9 % 250 mL Inject 100 mg into the vein every 12 (twelve) hours.   Yes [provider]  ertapenem (INVANZ) IVPB 1 g daily. 01/15/23 01/23/23 Yes [provider]  ferrous sulfate 325 (65 FE) MG tablet Take 325 mg by mouth every other day.   Yes [provider]  gabapentin (NEURONTIN) 100 MG capsule Take 100 mg by mouth 2 (two) times daily.   Yes [provider]  lactobacillus acidophilus & bulgar (LACTINEX) chewable tablet Chew 1 tablet by mouth 2 (two) times daily.   Yes [provider]  levothyroxine (SYNTHROID) 112 MCG tablet Take 1 tablet (112 mcg total) by mouth daily before breakfast. 07/12/21  Yes Bonnielee Haff, MD  oxyCODONE-acetaminophen (PERCOCET/ROXICET) 5-325 MG tablet Place 1 tablet into feeding tube 3 (three) times daily. Patient taking differently: Take 1 tablet by mouth 3 (three) times daily. 07/12/21  Yes Bonnielee Haff, MD  polyethylene glycol (MIRALAX / GLYCOLAX) 17 g packet Take 17 g by mouth 2 (two) times  daily. 07/12/21  Yes Bonnielee Haff, MD  senna (SENOKOT) 8.6 MG TABS tablet Take 2 tablets by mouth 2 (two) times daily.   Yes [provider]  sertraline (ZOLOFT) 25 MG tablet Take 25 mg by mouth daily.   Yes [provider]  sodium chloride 0.45 % solution Inject 70 mLs into the vein continuous. 70 ml per hour for 2 liters   Yes [provider]  Water For Irrigation, Sterile (FREE WATER) SOLN Place 75 mLs into feeding tube every 4 (four) hours. 07/12/21  Yes Bonnielee Haff, MD  bisacodyl (FLEET) 10 MG/30ML ENEM Place 10 mg rectally once.    [provider]  midodrine (PROAMATINE) 10 MG tablet Place 1 tablet (10 mg total) into feeding tube 3 (three) times daily as needed (if SBP < 100). 04/30/21   Val Riles, MD  sodium polystyrene (KAYEXALATE) 15 GM/60ML suspension Take 60 g by mouth as needed.    [provider]     Critical care time: 35 minutes    Donell Beers, [place] Pager [phone] (please enter 7 digits) PCCM Consult Pager [phone] (please enter 7 digits)

## 2023-01-24 NOTE — Plan of Care (Signed)
PMT shadowing. Patient extubated today. Will follow up tomorrow to assess ability to participate in [place] conversation.

## 2023-01-24 NOTE — Progress Notes (Signed)
NP Domingo Pulse notified RN that NGT okay to use at this time. Tube feedings resumed and medications given as ordered.

## 2023-01-24 NOTE — Progress Notes (Signed)
During vent check found pt to be on PRVC Vt 350, RR 20, 30%, +5 Peep. No documentation about when pt was changed from her SBT and no order or documentation for the current settings. RN aware of findings.

## 2023-01-24 NOTE — Progress Notes (Addendum)
1322 extubated to high flow nasal canula. Tolerated extubation well.1800 Tolerated extubation well. Poor cough effort. 1700 After bath refused reinsertion of flexi seal. So far clean and dry.

## 2023-01-24 NOTE — Progress Notes (Signed)
During tube feeding bottle replacement noted that patient's NGT was pulled oput to 55cm marking. Reinserted to 65cm mark and properly secured. Auscultated to verify placement. Tube feeding and meds held. No tube feeding noted in ETT in line suction. No changes in O2 sats or other vital signs.   Placed patient in mitts and notified NP Domingo Pulse. New order for portable Xray to verify placement entered.

## 2023-01-25 DIAGNOSIS — L0291 Cutaneous abscess, unspecified: Secondary | ICD-10-CM | POA: Diagnosis not present

## 2023-01-25 DIAGNOSIS — D72829 Elevated white blood cell count, unspecified: Secondary | ICD-10-CM | POA: Diagnosis not present

## 2023-01-25 DIAGNOSIS — A419 Sepsis, unspecified organism: Secondary | ICD-10-CM | POA: Diagnosis not present

## 2023-01-25 DIAGNOSIS — Z7189 Other specified counseling: Secondary | ICD-10-CM | POA: Diagnosis not present

## 2023-01-25 LAB — GLUCOSE, CAPILLARY
Glucose-Capillary: 115 mg/dL — ABNORMAL HIGH (ref 70–99)
Glucose-Capillary: 116 mg/dL — ABNORMAL HIGH (ref 70–99)
Glucose-Capillary: 126 mg/dL — ABNORMAL HIGH (ref 70–99)
Glucose-Capillary: 132 mg/dL — ABNORMAL HIGH (ref 70–99)
Glucose-Capillary: 137 mg/dL — ABNORMAL HIGH (ref 70–99)
Glucose-Capillary: 77 mg/dL (ref 70–99)
Glucose-Capillary: 90 mg/dL (ref 70–99)

## 2023-01-25 LAB — CBC
HCT: 24.6 % — ABNORMAL LOW (ref 36.0–46.0)
Hemoglobin: 7.5 g/dL — ABNORMAL LOW (ref 12.0–15.0)
MCH: 27.6 pg (ref 26.0–34.0)
MCHC: 30.5 g/dL (ref 30.0–36.0)
MCV: 90.4 fL (ref 80.0–100.0)
Platelets: 288 10*3/uL (ref 150–400)
RBC: 2.72 MIL/uL — ABNORMAL LOW (ref 3.87–5.11)
RDW: 14.5 % (ref 11.5–15.5)
WBC: 9.2 10*3/uL (ref 4.0–10.5)
nRBC: 0.2 % (ref 0.0–0.2)

## 2023-01-25 LAB — MAGNESIUM: Magnesium: 1.9 mg/dL (ref 1.7–2.4)

## 2023-01-25 LAB — BASIC METABOLIC PANEL
Anion gap: 5 (ref 5–15)
BUN: 22 mg/dL — ABNORMAL HIGH (ref 6–20)
CO2: 27 mmol/L (ref 22–32)
Calcium: 8 mg/dL — ABNORMAL LOW (ref 8.9–10.3)
Chloride: 109 mmol/L (ref 98–111)
Creatinine, Ser: 0.45 mg/dL (ref 0.44–1.00)
GFR, Estimated: >60 mL/min (ref >60)
Glucose, Bld: 143 mg/dL — ABNORMAL HIGH (ref 70–99)
Potassium: 3.5 mmol/L (ref 3.5–5.1)
Sodium: 141 mmol/L (ref 135–145)

## 2023-01-25 LAB — PHOSPHORUS: Phosphorus: 2.4 mg/dL — ABNORMAL LOW (ref 2.5–4.6)

## 2023-01-25 MED ORDER — INSULIN ASPART 100 UNIT/ML IJ SOLN
0.0000 [IU] | INTRAMUSCULAR | Status: DC
  Administered 2023-02-07 – 2023-02-08 (×2): 1 [IU] via SUBCUTANEOUS
  Filled 2023-01-25 (×3): qty 1

## 2023-01-25 MED ORDER — POTASSIUM CHLORIDE 20 MEQ PO PACK
40.0000 meq | PACK | Freq: Once | ORAL | Status: AC
  Administered 2023-01-25: 40 meq
  Filled 2023-01-25: qty 2

## 2023-01-25 MED ORDER — POTASSIUM & SODIUM PHOSPHATES 280-160-250 MG PO PACK
2.0000 | PACK | ORAL | Status: AC
  Administered 2023-01-25 (×2): 2
  Filled 2023-01-25 (×2): qty 2

## 2023-01-25 NOTE — Progress Notes (Signed)
Pt. Cleaned up and transferred to Rm [id number] without incident. Report given at the end of previous shift

## 2023-01-25 NOTE — Progress Notes (Addendum)
SLP Cancellation Note  Patient Details Name: Joanne [name] MRN: [id number] DOB: 06-07-1971   Cancelled treatment:       Reason Eval/Treat Not Completed:  (chart reviewed; consulted both MD and Palliative Care re: pt's status re: moving forward w/ oral intake/GOC.)  Post discussion w/ Palliative Care, then MD, "pt has had a PEG tube placement (just removed 08/2022), and she has had d/w Pallaitive Care NP today and would like PEG (J tube) feeding again".  Pt has a history of TBI, chronic contractures and resident of a nursing home who presents to the hospital with toxic metabolic encephalopathy and septic shock secondary to suprapubic abscess. She underwent abscess drainage and wound VAC placement complicated by post-op respiratory failure and failure to wean off the ventilator. She remained orally intubated for ~8 days. Pt has significant issues of the abdomen pelvis w/ Severe Sepsis secondary to Suprapubic Abscess and suspected CAP.  Per discussion w/ Palliative Care and MD/team, recommend pt remain NPO w/ GOC by Palliative Care/MD directing alternative means of feeding/PEG placement at this time. Pt can have f/u at her NH for reinitiating po trials post acuity of illness and recovery; risk for aspiration will be less when she is more medically stable. This was discussed w/ MD who agreed to HOLD on any ST services/po intake at this time.  Recommend frequent oral care for hygiene and stimulation of swallowing. NSG updated.  Palliative Care fully agreed that the above aligned w/ the GOC discussion w/ pt today.        Jerilynn Som, MS, CCC-SLP Speech Language Pathologist Rehab Services; [facility] [phone] (ascom) Joanne [name] 01/25/2023, 5:03 PM

## 2023-01-25 NOTE — Progress Notes (Addendum)
Daily Progress Note   Patient Name: Joanne [name]       Date: 01/25/2023 DOB: [birth year]  Age: 52 y.o. MRN#: [id number] Attending Physician: Joanne Mandes, MD Primary Care Physician: Joanne [name], MD Admit Date: 01/16/2023  Reason for Consultation/Follow-up: Establishing goals of care  Subjective: Notes and labs reviewed. In with pharmacist Burnard Leigh to see patient.  Patient was alert and able to speak with me though she had a very soft voice.  Towards the end of the conversation I was unable to make out what she was saying due to voice weakness, but I used a stethoscope and held it to her mouth and was able to hear what she was saying to me.  Upon discussing her care moving forward, patient stated "I am alive".  Worked to determine any limits to care and discussed different scenarios and inquired her thoughts on them.  She stated "if it is God's will".  She later states "I pray for God to heal me". She stated "I pray constantly".  Reframed questions to ensure that she wants any and all care possible to stay alive as long as possible, regardless of pain or suffering.  Inquired if she would want chest compressions, shocks, and a breathing tube if she does not have a pulse and her breathing stops.  She said "no".  Reframed the question multiple times to elicit both yes and no responses, with answers that she would not want CPR and would want to be allowed to die naturally and go to heaven.  Discussed that I wanted to be sure that we understood her wishes and patient stated "go with peace and dignity".  Called to speak with patient's legal guardian Joanne [name].  Discussed my previous conversation with patient in 2021, and discussed current conversation.  Discussed changing CODE STATUS.  Joanne [name]'s son  Joanne [name] entered into conversation.  Discussed conversation with patient.  Joanne [name] discusses that patient has been an inspiration for everyone who knows her due to her tough nests and ability to endure.   He states she has always advised that she would want to do anything possible to live.  Clarified that she stated this to me as well.  Discussed the difference in directives for when a person is breathing and has a pulse, and directives for when a person has  gone into cardiopulmonary arrest, and is clinically died.  Plans for a family meeting tomorrow at 2:00 at bedside.  Length of Stay: 9  Current Medications: Scheduled Meds:   sodium chloride   Intravenous Once   ascorbic acid  500 mg Per Tube BID   baclofen  10 mg Per Tube TID   carBAMazepine  50 mg Per Tube Q6H   Chlorhexidine Gluconate Cloth  6 each Topical Daily   enoxaparin (LOVENOX) injection  40 mg Subcutaneous Q24H   famotidine  20 mg Per Tube BID   feeding supplement (PROSource TF20)  60 mL Per Tube Daily   free water  100 mL Per Tube Q4H   insulin aspart  0-6 Units Subcutaneous Q4H   levothyroxine  112 mcg Per Tube Q0600   nutrition supplement (JUVEN)  1 packet Per Tube BID BM   sertraline  25 mg Per Tube Daily   sodium chloride flush  3 mL Intravenous Q12H    Continuous Infusions:  feeding supplement (OSMOLITE 1.5 CAL) 50 mL/hr at 01/25/23 1300   piperacillin-tazobactam (ZOSYN)  IV 3.375 g (01/25/23 1434)    PRN Meds: acetaminophen **OR** acetaminophen, docusate, mouth rinse  Physical Exam Pulmonary:     Effort: Pulmonary effort is normal.  Neurological:     Mental Status: She is alert.             Vital Signs: BP (!) 98/53   Pulse 90   Temp 98 F (36.7 C) (Oral)   Resp (!) 26   Ht 5\' 2"  (1.575 m)   Wt 72.8 kg   LMP  (LMP Unknown)   SpO2 97%   BMI 29.35 kg/m  SpO2: SpO2: 97 % O2 Device: O2 Device: Nasal Cannula O2 Flow Rate: O2 Flow Rate (L/min): 1 L/min  Intake/output summary:  Intake/Output  Summary (Last 24 hours) at 01/25/2023 1559 Last data filed at 01/25/2023 1300 Gross per 24 hour  Intake 1763.68 ml  Output 660 ml  Net 1103.68 ml   LBM: Last BM Date : 01/24/23 Baseline Weight: Weight: 70 kg Most recent weight: Weight: 72.8 kg  Patient Active Problem List   Diagnosis Date Noted   Abdominal wall cellulitis 01/17/2023   Abscess 01/17/2023   Pneumonia 01/16/2023   Sepsis 01/16/2023   Leukocytosis 01/16/2023   HCAP (healthcare-associated pneumonia) 07/04/2021   Aspiration pneumonia 07/04/2021   Chronic diastolic CHF (congestive heart failure) [id number]   Acute metabolic encephalopathy [id number]   Hyperkalemia 07/04/2021   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   Wound, open, in back and left leg 07/04/2021   Chronic indwelling Foley catheter    Leaking PEG tube    Failure to thrive in adult 04/24/2021   Requires assistance with all daily activities 04/24/2021   G tube feedings 04/24/2021   Closed injury of head 09/06/2020   Seizure disorder 09/06/2020   Vitamin D deficiency 09/06/2020   Mild malnutrition    Protein-calorie malnutrition, severe 05/20/2020   Weakness    Full code status    Hypokalemia 05/16/2020   Severe sepsis with septic shock 05/15/2020   Sacral decubitus ulcer, stage IV    Goals of care, counseling/discussion    Palliative care by specialist    DNR (do not resuscitate) discussion    Septic shock 03/02/2020   Hypernatremia 11/15/2019   Thrombocytopenia 11/15/2019   AKI (acute kidney injury) 11/15/2019   HTN (hypertension) 11/15/2019   Non-insulin dependent type 2 diabetes mellitus 11/15/2019   Hypothyroidism 11/15/2019  Severe sepsis with acute organ dysfunction 11/15/2019   Left hemiplegia 11/15/2019   Pressure injury of skin 11/15/2019   Aspiration pneumonia due to gastric secretions    Acute kidney injury (AKI) with acute tubular necrosis (ATN)    Traumatic brain injury    Abdominal pain 08/05/2019    Vomiting 08/05/2019   Acute on chronic respiratory failure with hypoxia 06/06/2018   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   Pneumonia due to Klebsiella pneumoniae 06/06/2018   Acute on chronic combined systolic and diastolic CHF (congestive heart failure) 06/06/2018   Sepsis secondary to UTI 06/06/2018   ARDS (adult respiratory distress syndrome) 06/06/2018   Acute respiratory failure with hypoxia 06/06/2018   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 Spoke with SLP and attending.  Recommend a J-tube be placed for continued life-prolonging care.  This was discussed with patient's legal guardian who is amenable. With patient's poor vocal quality,  using a stethoscope held to her mouth to be able to amplify and hear what she is saying was successful.  Code Status:    Code Status Orders  (From admission, onward)           Start     Ordered   01/16/23 2339  Full code  Continuous       Question:  By:  Answer:  Default: patient does not have capacity for decision making, no surrogate or prior directive available   01/16/23 2339           Code Status History     Date Active Date Inactive Code Status Order ID Comments User Context   07/04/2021 1640 07/12/2021 1925 Full Code [id number]  Ivor Costa, MD ED   04/24/2021 1303 04/30/2021 2150 Full Code [id number]  Cox, [place], DO ED   05/15/2020 1905 06/10/2020 2329 Full Code [id number]  Vashti Hey, MD ED   03/02/2020 1721 03/16/2020 2328 Full Code [id number]  Flora Lipps, MD ED   11/15/2019 0253 11/21/2019 0411 Full  Code [id number]  Athena Masse, MD ED   09/10/2019 1522 10/16/2019 1653 Full Code [id number]  [place]-Rivera, [facility]   Thank you for allowing the Palliative Medicine Team to assist in the care of this patient.   Asencion Gowda, NP  Please contact Palliative Medicine Team phone at [phone] for questions and concerns.

## 2023-01-25 NOTE — Progress Notes (Signed)
Patient found with foley out and balloon intact. Dr Posey Pronto informed and stated to replace foley related to neurogenic bladder. Report given to oncoming RN.

## 2023-01-25 NOTE — Progress Notes (Signed)
Report called to receiving RN [place] room 239. Awaiting room and then will transfer via bed.

## 2023-01-25 NOTE — Progress Notes (Signed)
[place] at [place] NAME: Joanne [name]    MR#:  [id number]  DATE OF BIRTH:  04/14/1971  SUBJECTIVE:  patient very soft-spoken. No new complaints per RN. Breathing appropriately. Patient to meet with palliative care RN. Speech to evaluate. No fever. Got extubated tolerating nasal cannula oxygen no family at bedside    VITALS:  Blood pressure (!) 98/53, pulse 90, temperature 98 F (36.7 C), temperature source Oral, resp. rate (!) 26, height 5\' 2"  (1.575 m), weight 72.8 kg, SpO2 97 %.  PHYSICAL EXAMINATION:   GENERAL:  52 y.o.-year-old patient with no acute distress. contractures LUNGS: Normal breath sounds bilaterally CARDIOVASCULAR: S1, S2 normal ABDOMEN: Soft, nontender, nondistended. EXTREMITIES: contractures NEUROLOGIC: nonfocal  patient is alert, soft spoken SKIN:  Pressure Injury 01/17/23 Buttocks Right Stage 3 -  Full thickness tissue loss. Subcutaneous fat may be visible but bone, tendon or muscle are NOT exposed. 01/17/23; updated by [place] full thickness; Stage 3 (Active)  01/17/23 0430  Location: Buttocks  Location Orientation: Right  Staging: Stage 3 -  Full thickness tissue loss. Subcutaneous fat may be visible but bone, tendon or muscle are NOT exposed.  Wound Description (Comments): 01/17/23; updated by [place] full thickness; Stage 3  Present on Admission: Yes     Pressure Injury 01/17/23 Buttocks Right Stage 3 -  Full thickness tissue loss. Subcutaneous fat may be visible but bone, tendon or muscle are NOT exposed. 01/17/23 updated by [place] nurse; full thickness Stage 3 (Active)  01/17/23 0430  Location: Buttocks  Location Orientation: Right  Staging: Stage 3 -  Full thickness tissue loss. Subcutaneous fat may be visible but bone, tendon or muscle are NOT exposed.  Wound Description (Comments): 01/17/23 updated by [place] nurse; full thickness Stage 3  Present on Admission: Yes     LABORATORY PANEL:  CBC Recent Labs  Lab  01/25/23 0419  WBC 9.2  HGB 7.5*  HCT 24.6*  PLT 288    Chemistries  Recent Labs  Lab 01/25/23 0419  NA 141  K 3.5  CL 109  CO2 27  GLUCOSE 143*  BUN 22*  CREATININE 0.45  CALCIUM 8.0*  MG 1.9   Cardiac Enzymes No results for input(s): "TROPONINI" in the last 168 hours. RADIOLOGY:  DG Abd 1 View  Result Date: 01/24/2023 CLINICAL DATA:  52 year old female status post placement of orogastric tube. EXAM: ABDOMEN - 1 VIEW COMPARISON:  Abdominal radiograph 01/17/2023. FINDINGS: Orogastric tube extends into the right upper quadrant of the abdomen, likely in the distal antral pre-pyloric region of the stomach or proximal duodenal no pathologic dilatation of small bowel. Gas and stool are noted throughout visualized portions of the colon. Surgical clips project over the right upper quadrant of the abdomen, likely from prior cholecystectomy. IMPRESSION: 1. Orogastric tube in position, as above. 2. Nonobstructive bowel-gas pattern. Electronically Signed   By: Vinnie Langton M.D.   On: 01/24/2023 06:06    Assessment and Plan  52 year old female with a history of TBI, chronic contractures and resident of a nursing home who presents to the hospital with toxic metabolic encephalopathy and septic shock secondary to suprapubic abscess. She underwent abscess drainage and wound VAC placement complicated by post-op respiratory failure and failure to wean off the ventilator.   CXR 01/16/2023: Patchy opacities in the right lower lung atelectasis versus infection.  Stable cardiomegaly CT CT abdomen pelvis with contrast 01/17/2023: Extensive soft tissue infiltration, soft tissue gas and loculated collections in  the low anterior pelvic fat extending into the genital region consistent with Fournier's gas gangrene.  Delayed appearance of contrast material in the renal collecting systems may indicate metabolic disease.  No evidence of obstruction.  Broad-based ventral abdominal wall hernia containing small and  large bowel without proximal obstruct.  Severe Sepsis secondary to Suprapubic Abscess and suspected CAP (present on admission) --S/p Debridement x1 and wound reexploration with VAC placement -General Surgery following, appreciate input ~ wound care and VAC changes as per Surgery recommendations ~ "Recommend continued Monday Wednesday Friday wound VAC changes until less depth noted in the wound. Wound ostomy nurse and/or floor nurse to continue changes for now. Surgery will peripherally follow. Will follow-up next week if patient still in-house. She can follow-up in the office as outpatient in 1 week if discharged prior to" -WBC down to normal 9.2 -Continue empiric Zosyn    #Hyperglycemia #Hypothyroidism -SSI -Continue Synthroid   #Acute Metabolic Encephalopathy #Chronic Seizure Disorder PMHx: TBI with chronic contractures and chronic pressure injuries (present on admission) -Maintain a RASS goal of 0 to -1 -Continue Baclofen  Post-Op Respiratory Failure in the setting of severe Sepsis & and suspected CAP -pt on [place] oxygen  H/o TBI, bed bound with contractures Nutrition --pt has PEG tube couple years ago --she has had d/w Pallaitive care NP today and would like PEG (J tube) feeding.    Procedures Family communication :none Consults :PCCM CODE STATUS: full DVT Prophylaxis :enoxaparin Level of care: Stepdown Status is: Inpatient Remains inpatient appropriate because: sepsis    TOTAL TIME TAKING CARE OF THIS PATIENT: 35 minutes.  >50% time spent on counselling and coordination of care  Note: This dictation was prepared with Dragon dictation along with smaller phrase technology. Any transcriptional errors that result from this process are unintentional.  Fritzi Mandes M.D    [facility]   CC: Primary care physician; Nona Dell, Corene Cornea, MD

## 2023-01-26 ENCOUNTER — Inpatient Hospital Stay: Payer: Medicare Other

## 2023-01-26 DIAGNOSIS — L0291 Cutaneous abscess, unspecified: Secondary | ICD-10-CM | POA: Diagnosis not present

## 2023-01-26 DIAGNOSIS — D72829 Elevated white blood cell count, unspecified: Secondary | ICD-10-CM | POA: Diagnosis not present

## 2023-01-26 DIAGNOSIS — A419 Sepsis, unspecified organism: Secondary | ICD-10-CM | POA: Diagnosis not present

## 2023-01-26 DIAGNOSIS — Z7189 Other specified counseling: Secondary | ICD-10-CM | POA: Diagnosis not present

## 2023-01-26 LAB — TYPE AND SCREEN
ABO/RH(D): A POS
Antibody Screen: NEGATIVE
Unit division: 0

## 2023-01-26 LAB — BPAM RBC
Blood Product Expiration Date: 202404042359
Unit Type and Rh: 600

## 2023-01-26 LAB — BASIC METABOLIC PANEL
Anion gap: 8 (ref 5–15)
BUN: 16 mg/dL (ref 6–20)
CO2: 29 mmol/L (ref 22–32)
Calcium: 8.4 mg/dL — ABNORMAL LOW (ref 8.9–10.3)
Chloride: 102 mmol/L (ref 98–111)
Creatinine, Ser: 0.51 mg/dL (ref 0.44–1.00)
GFR, Estimated: >60 mL/min (ref >60)
Glucose, Bld: 104 mg/dL — ABNORMAL HIGH (ref 70–99)
Potassium: 4.4 mmol/L (ref 3.5–5.1)
Sodium: 139 mmol/L (ref 135–145)

## 2023-01-26 LAB — GLUCOSE, CAPILLARY
Glucose-Capillary: 100 mg/dL — ABNORMAL HIGH (ref 70–99)
Glucose-Capillary: 113 mg/dL — ABNORMAL HIGH (ref 70–99)
Glucose-Capillary: 125 mg/dL — ABNORMAL HIGH (ref 70–99)
Glucose-Capillary: 126 mg/dL — ABNORMAL HIGH (ref 70–99)
Glucose-Capillary: 79 mg/dL (ref 70–99)
Glucose-Capillary: 92 mg/dL (ref 70–99)

## 2023-01-26 NOTE — Consult Note (Signed)
WOC Nurse wound follow up NPWT s/p wound exploration former suprapubic abscess; Dr. Tonna Boehringer did assess wound at this visit   Wound type: surgical   Wound bed: 80% pink moist 20% yellow tan devitalized tissue  Drainage (amount, consistency, odor) minimal serosanguinous  Periwound: intact  Dressing procedure/placement/frequency: Removed old NPWT dressing Cleansed wound with normal saline Filled wound with 1 piece of black foam  Sealed NPWT dressing at HG   Patient tolerated procedure well  WOC nurse will continue to provide NPWT dressing changes M-W-F  due to the complexity of the dressing change. Next NPWT dressing change Monday 01/29/2023. Supplies at bedside.     Thank you,    Priscella Mann MSN, RN-BC, [employer] [phone]

## 2023-01-26 NOTE — Progress Notes (Addendum)
Daily Progress Note   Patient Name: Joanne [name]       Date: 01/26/2023 DOB: [birth year]  Age: 52 y.o. MRN#: [id number] Attending Physician: Enedina Finner, MD Primary Care Physician: Crist Fat, MD Admit Date: 01/16/2023  Reason for Consultation/Follow-up: Establishing goals of care  Subjective: Notes and labs reviewed. Spoke with attending who will see if GI will place PEG.  In to see patient with Joanne [name], pharmacist. No family at bedside.   Reviewed code status conversation from yesterday. Patient initially agrees with conversation from yesterday and states she would not want CPR and states "let me go". She adds "I miss my mother". With further conversation and reframing, she states she would want CPR and resuscitative efforts to try to restart her heart.   Cousin Joanne [name] ([name]'s son) entered room as conversation was completing. He states Joanne [name] was not able to come. Discussed conversation. Discussed updates on PEG.   Will follow up next week after GI consult. Aspiration PNA, albumin 1.9. Wounds present.  Full code/ full scope at present.         Length of Stay: 10  Current Medications: Scheduled Meds:   ascorbic acid  500 mg Per Tube BID   baclofen  10 mg Per Tube TID   carBAMazepine  50 mg Per Tube Q6H   Chlorhexidine Gluconate Cloth  6 each Topical Daily   famotidine  20 mg Per Tube BID   feeding supplement (PROSource TF20)  60 mL Per Tube Daily   free water  100 mL Per Tube Q4H   insulin aspart  0-6 Units Subcutaneous Q4H   levothyroxine  112 mcg Per Tube Q0600   nutrition supplement (JUVEN)  1 packet Per Tube BID BM   sertraline  25 mg Per Tube Daily   sodium chloride flush  3 mL Intravenous Q12H    Continuous Infusions:  feeding supplement (OSMOLITE 1.5 CAL)  50 mL/hr at 01/25/23 1900   piperacillin-tazobactam (ZOSYN)  IV Stopped (01/26/23 1010)    PRN Meds: acetaminophen **OR** acetaminophen, docusate, mouth rinse  Physical Exam Pulmonary:     Effort: Pulmonary effort is normal.  Neurological:     Mental Status: She is alert.             Vital Signs: BP (!) 104/51 (BP Location: Right Wrist)  Pulse 99   Temp 99.3 F (37.4 C) (Axillary)   Resp (!) 22   Ht 5\' 2"  (1.575 m)   Wt 70.4 kg   LMP  (LMP Unknown)   SpO2 100%   BMI 28.39 kg/m  SpO2: SpO2: 100 % O2 Device: O2 Device: Room Air O2 Flow Rate: O2 Flow Rate (L/min): 1 L/min  Intake/output summary:  Intake/Output Summary (Last 24 hours) at 01/26/2023 1500 Last data filed at 01/26/2023 1428 Gross per 24 hour  Intake 1203.47 ml  Output 975 ml  Net 228.47 ml   LBM: Last BM Date : 01/24/23 Baseline Weight: Weight: 70 kg Most recent weight: Weight: 70.4 kg  Patient Active Problem List   Diagnosis Date Noted   Abdominal wall cellulitis 01/17/2023   Abscess 01/17/2023   Pneumonia 01/16/2023   Sepsis 01/16/2023   Leukocytosis 01/16/2023   HCAP (healthcare-associated pneumonia) 07/04/2021   Aspiration pneumonia 07/04/2021   Chronic diastolic CHF (congestive heart failure) 07/04/2021   Acute metabolic encephalopathy 07/04/2021   Hyperkalemia 07/04/2021   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   Wound, open, in back and left leg 07/04/2021   Chronic indwelling Foley catheter    Leaking PEG tube    Failure to thrive in adult 04/24/2021   Requires assistance with all daily activities 04/24/2021   G tube feedings 04/24/2021   Closed injury of head 09/06/2020   Seizure disorder 09/06/2020   Vitamin D deficiency 09/06/2020   Mild malnutrition    Protein-calorie malnutrition, severe 05/20/2020   Weakness    Full code status    Hypokalemia 05/16/2020   Severe sepsis with septic shock 05/15/2020   Sacral decubitus ulcer, stage IV    Goals of care,  counseling/discussion    Palliative care by specialist    DNR (do not resuscitate) discussion    Septic shock 03/02/2020   Hypernatremia 11/15/2019   Thrombocytopenia 11/15/2019   AKI (acute kidney injury) 11/15/2019   HTN (hypertension) 11/15/2019   Non-insulin dependent type 2 diabetes mellitus 11/15/2019   Hypothyroidism 11/15/2019   Severe sepsis with acute organ dysfunction 11/15/2019   Left hemiplegia 11/15/2019   Pressure injury of skin 11/15/2019   Aspiration pneumonia due to gastric secretions    Acute kidney injury (AKI) with acute tubular necrosis (ATN)    Traumatic brain injury    Abdominal pain 08/05/2019   Vomiting 08/05/2019   Acute on chronic respiratory failure with hypoxia 06/06/2018   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   Pneumonia due to Klebsiella pneumoniae 06/06/2018   Acute on chronic combined systolic and diastolic CHF (congestive heart failure) 06/06/2018   Sepsis secondary to UTI 06/06/2018   ARDS (adult respiratory distress syndrome) 06/06/2018   Acute respiratory failure with hypoxia 06/06/2018   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   PMT will follow up next week after GI.   Code Status:    Code Status Orders  (From admission, onward)           Start     Ordered   01/16/23 2339  Full code  Continuous       Question:  By:  Answer:  Default: patient does not have capacity for decision making, no surrogate or prior directive available   01/16/23 2339           Code Status History     Date Active Date Inactive Code Status  Order ID Comments User Context   07/04/2021 1640 07/12/2021 1925 Full Code [id number]  Lorretta HarpNiu, Xilin, MD ED   04/24/2021 1303 04/30/2021 2150 Full Code [id number]  Cox, Amy N, DO ED   05/15/2020 1905 06/10/2020 2329 Full Code [id number]  Pieter Partridgehatterjee, Srobona Tublu, MD ED   03/02/2020 1721 03/16/2020 2328 Full Code [id number]  Erin FullingKasa, Kurian, MD ED   11/15/2019 0253 11/21/2019 0411 Full Code [id number]  Andris Baumannuncan, Hazel V, MD ED   09/10/2019 1522 10/16/2019 1653 Full Code [id number]  [place], [facility]         Thank you for allowing the Palliative Medicine Team to assist in the care of this patient.     Morton Stallrystal Montrice Montuori, NP  Please contact Palliative Medicine Team phone at [phone] for questions and concerns.

## 2023-01-26 NOTE — Progress Notes (Signed)
[facility]  - [place] at [facility]   PATIENT NAME: Joanne [name]    MR#:  [id number]  DATE OF BIRTH:  February 13, 1971  SUBJECTIVE:  patient very soft-spoken. No new complaints per RN. Breathing appropriately. PNo fever. Got extubated  tolerating nasal cannula oxygen  no family at bedside NG feeling +    VITALS:  Blood pressure (!) 104/51, pulse 99, temperature 99.3 F (37.4 C), temperature source Axillary, resp. rate (!) 22, height 5\' 2"  (1.575 m), weight 70.4 kg, SpO2 100 %.  PHYSICAL EXAMINATION:   GENERAL:  52 y.o.-year-old patient with no acute distress. contractures LUNGS: Normal breath sounds bilaterally CARDIOVASCULAR: S1, S2 normal ABDOMEN: Soft, wound vac + EXTREMITIES: contractures NEUROLOGIC: nonfocal  patient is alert, soft spoken SKIN:  Pressure Injury 01/17/23 Buttocks Right Stage 3 -  Full thickness tissue loss. Subcutaneous fat may be visible but bone, tendon or muscle are NOT exposed. 01/17/23; updated by WOC full thickness; Stage 3 (Active)  01/17/23 0430  Location: Buttocks  Location Orientation: Right  Staging: Stage 3 -  Full thickness tissue loss. Subcutaneous fat may be visible but bone, tendon or muscle are NOT exposed.  Wound Description (Comments): 01/17/23; updated by WOC full thickness; Stage 3  Present on Admission: Yes     Pressure Injury 01/17/23 Buttocks Right Stage 3 -  Full thickness tissue loss. Subcutaneous fat may be visible but bone, tendon or muscle are NOT exposed. 01/17/23 updated by WOC nurse; full thickness Stage 3 (Active)  01/17/23 0430  Location: Buttocks  Location Orientation: Right  Staging: Stage 3 -  Full thickness tissue loss. Subcutaneous fat may be visible but bone, tendon or muscle are NOT exposed.  Wound Description (Comments): 01/17/23 updated by WOC nurse; full thickness Stage 3  Present on Admission: Yes     LABORATORY PANEL:  CBC Recent Labs  Lab 01/25/23 0419  WBC 9.2  HGB 7.5*  HCT 24.6*  PLT  288     Chemistries  Recent Labs  Lab 01/25/23 0419 01/26/23 0900  NA 141 139  K 3.5 4.4  CL 109 102  CO2 27 29  GLUCOSE 143* 104*  BUN 22* 16  CREATININE 0.45 0.51  CALCIUM 8.0* 8.4*  MG 1.9  --     Cardiac Enzymes No results for input(s): "TROPONINI" in the last 168 hours. RADIOLOGY:  No results found.  Assessment and Plan  52 year old female with a history of TBI, chronic contractures and resident of a nursing home who presents to the hospital with toxic metabolic encephalopathy and septic shock secondary to suprapubic abscess. She underwent abscess drainage and wound VAC placement complicated by post-op respiratory failure and failure to wean off the ventilator.   CXR 01/16/2023: Patchy opacities in the right lower lung atelectasis versus infection.  Stable cardiomegaly CT CT abdomen pelvis with contrast 01/17/2023: Extensive soft tissue infiltration, soft tissue gas and loculated collections in the low anterior pelvic fat extending into the genital region consistent with Fournier's gas gangrene.  Delayed appearance of contrast material in the renal collecting systems may indicate metabolic disease.  No evidence of obstruction.  Broad-based ventral abdominal wall hernia containing small and large bowel without proximal obstruct.  Severe Sepsis secondary to Suprapubic Abscess and suspected CAP (present on admission) --S/p Debridement x1 and wound reexploration with VAC placement -General Surgery following, appreciate input ~ wound care and VAC changes as per Surgery recommendations ~ "Recommend continued Monday Wednesday Friday wound VAC changes until less depth noted in  the wound. Wound ostomy nurse and/or floor nurse to continue changes for now. Surgery will peripherally follow. Will follow-up next week if patient still in-house. She can follow-up in the office as outpatient in 1 week if discharged prior to" -WBC down to normal 9.2 - empiric Zosyn completed rx for total 8 days.   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  --IR and gen surgery unable to place J tube due to her ventral hernia, contracture, recent surgery. --Spoke with Dr Baxter HireWohl--GI--he will try to place PEG/G-tube Tuesday am. --will cont NG feeding till then   Procedures Family communication :none Consults :PCCM CODE STATUS: full DVT Prophylaxis :enoxaparin Level of care: Progressive Status is: Inpatient Remains inpatient appropriate because: sepsis    TOTAL TIME TAKING CARE OF THIS PATIENT: 35 minutes.  >50% time spent on counselling and coordination of care  Note: This dictation was prepared with Dragon dictation along with smaller phrase technology. Any transcriptional errors that result from this process are unintentional.  Enedina FinnerSona Saraphina Lauderbaugh M.D    [facility]   CC: Primary care physician; Leonia ReaderVan Eyk, Barbara CowerJason, MD

## 2023-01-26 NOTE — Consult Note (Signed)
Subjective:   CC: J tube placement  HPI:  Joanne [name] is a 52 y.o. female who was consulted by Allena KatzPatel for evaluation of above.  Concerns for inadequate nutrition intake s/p I&D of suprapubic abscess.  Currently still has NG tube in place with tube feeding.  History of PEG tube placement in the past.  2021 with Dr. Tyrone SageWohl.unclear from imaging studies around that time if the degree of her flexion contracture was present at that time.  Her large incisional hernia was present at the time of initial PEG tube placement.  No report of inadequate nutrition intake prior to this admission.  Past Medical History:  has a past medical history of Acute kidney injury (AKI) with acute tubular necrosis (ATN) (HCC), Acute on chronic combined systolic and diastolic CHF (congestive heart failure) (HCC) (06/06/2018), Acute on chronic respiratory failure with hypoxia (HCC) (06/06/2018), ARDS (adult respiratory distress syndrome) (HCC) (06/06/2018), Aspiration pneumonia due to gastric secretions (HCC), Diabetes mellitus (HCC), History of traumatic brain injury, Hypothyroidism, Pneumonia due to Klebsiella pneumoniae (HCC) (06/06/2018), Seizure disorder (HCC), Severe sepsis (HCC) (06/06/2018), 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  Past Surgical History:  has a past surgical history that includes Tracheostomy; PEG placement (N/A, 05/27/2020); IR Replc Gastro/Colonic Tube Percut W/Fluoro (07/11/2021); Percutaneous endoscopic gastrostomy (peg) removal (N/A, 08/29/2022); Incision and drainage of wound (N/A, 01/17/2023); Wound exploration (N/A, 01/19/2023); and Application if wound vac (N/A, 01/19/2023).  Family History: Family history is unknown by patient.  Social History:  reports that she has never smoked. She has never used smokeless tobacco. She reports that she does not currently use alcohol. She reports that she does not currently use drugs.  Current Medications:  Prior to Admission  medications   Medication Sig Start Date End Date Taking? Authorizing Provider  acetaminophen (TYLENOL) 325 MG tablet Take 650 mg by mouth every 8 (eight) hours as needed for mild pain or moderate pain.   Yes [provider]  albuterol (PROVENTIL) (2.5 MG/3ML) 0.083% nebulizer solution Take 3 mLs (2.5 mg total) by nebulization every 4 (four) hours as needed for wheezing or shortness of breath. 07/12/21  Yes Osvaldo ShipperKrishnan, Gokul, MD  ascorbic acid (VITAMIN C) 500 MG tablet Take 1,000 mg by mouth daily.   Yes [provider]  baclofen (LIORESAL) 10 MG tablet Take 10 mg by mouth 3 (three) times daily. 07/19/20  Yes [provider]  carbamazepine (TEGRETOL XR) 100 MG 12 hr tablet Take 100 mg by mouth 2 (two) times daily.   Yes [provider]  cefTRIAXone (ROCEPHIN) IVPB 2 g once.   Yes [provider]  Cranberry 450 MG CAPS Take 450 mg by mouth daily.   Yes [provider]  docusate (COLACE) 50 MG/5ML liquid Place 10 mLs (100 mg total) into feeding tube 2 (two) times daily. Patient taking differently: Take 100 mg by mouth 2 (two) times daily. 07/12/21  Yes Osvaldo ShipperKrishnan, Gokul, MD  doxycycline 100 mg in sodium chloride 0.9 % 250 mL Inject 100 mg into the vein every 12 (twelve) hours.   Yes [provider]  ferrous sulfate 325 (65 FE) MG tablet Take 325 mg by mouth every other day.   Yes [provider]  gabapentin (NEURONTIN) 100 MG capsule Take 100 mg by mouth 2 (two) times daily.   Yes [provider]  lactobacillus acidophilus & bulgar (LACTINEX) chewable tablet Chew 1 tablet by mouth 2 (two) times daily.   Yes [provider]  levothyroxine (SYNTHROID) 112  MCG tablet Take 1 tablet (112 mcg total) by mouth daily before breakfast. 07/12/21  Yes Osvaldo Shipper, MD  oxyCODONE-acetaminophen (PERCOCET/ROXICET) 5-325 MG tablet Place 1 tablet into feeding tube 3 (three) times daily. Patient taking differently: Take 1 tablet by  mouth 3 (three) times daily. 07/12/21  Yes Osvaldo Shipper, MD  polyethylene glycol (MIRALAX / GLYCOLAX) 17 g packet Take 17 g by mouth 2 (two) times daily. 07/12/21  Yes Osvaldo Shipper, MD  senna (SENOKOT) 8.6 MG TABS tablet Take 2 tablets by mouth 2 (two) times daily.   Yes [provider]  sertraline (ZOLOFT) 25 MG tablet Take 25 mg by mouth daily.   Yes [provider]  sodium chloride 0.45 % solution Inject 70 mLs into the vein continuous. 70 ml per hour for 2 liters   Yes [provider]  Water For Irrigation, Sterile (FREE WATER) SOLN Place 75 mLs into feeding tube every 4 (four) hours. 07/12/21  Yes Osvaldo Shipper, MD  bisacodyl (FLEET) 10 MG/30ML ENEM Place 10 mg rectally once.    [provider]  midodrine (PROAMATINE) 10 MG tablet Place 1 tablet (10 mg total) into feeding tube 3 (three) times daily as needed (if SBP < 100). 04/30/21   Gillis Santa, MD  sodium polystyrene (KAYEXALATE) 15 GM/60ML suspension Take 60 g by mouth as needed.    [provider]    Allergies:  Allergies  Allergen Reactions   Kaopectate  [Attapulgite] Itching   Thiopental Itching   Gatifloxacin     Other reaction(s): Unknown   Ceftin [Cefuroxime]     Tolerates cefepime, ceftriaxone, ampicillin/sulbactam, piperacillin/tazobactam   Tomato     ROS:  Unable to obtain secondary to patient baseline status from past TBI.   Objective:     BP (!) 100/57 (BP Location: Right Wrist)   Pulse (!) 105   Temp 98.7 F (37.1 C) (Axillary)   Resp 18   Ht 5\' 2"  (1.575 m)   Wt 70.4 kg   LMP  (LMP Unknown)   SpO2 100%   BMI 28.39 kg/m   Constitutional :  alert and no distress  Lymphatics/Throat:  no asymmetry, masses, or scars  Respiratory:  clear to auscultation bilaterally  Cardiovascular:  regular rate and rhythm  Gastrointestinal: Soft, no guarding, large midline incision with obviously palpable ventral hernia defect.  Severe flexion contracture at the torso no  obvious area of safe PEG tube placement was able to be visualized secondary to her contracture as well as her large ventral hernia. .  Musculoskeletal: Steady gait and movement  Skin: Cool and moist, suprapubic wound examined in between wound VAC changes.  Base and sidewalls clean with healthy pink granulation tissue.  Please see wound care note for further details.   Psychiatric: Normal affect, non-agitated, not confused       LABS:     Latest Ref Rng & Units 01/25/2023    4:19 AM 01/24/2023    6:30 PM 01/24/2023    6:20 AM  CMP  Glucose 70 - 99 mg/dL 841   324   BUN 6 - 20 mg/dL 22   21   Creatinine 4.01 - 1.00 mg/dL 0.27   2.53   Sodium 664 - 145 mmol/L 141   142   Potassium 3.5 - 5.1 mmol/L 3.5  3.8  3.3   Chloride 98 - 111 mmol/L 109   109   CO2 22 - 32 mmol/L 27   27   Calcium 8.9 - 10.3 mg/dL  8.0   8.4       Latest Ref Rng & Units 01/25/2023    4:19 AM 01/24/2023    6:20 AM 01/23/2023    3:25 PM  CBC  WBC 4.0 - 10.5 K/uL 9.2  11.5  12.5   Hemoglobin 12.0 - 15.0 g/dL 7.5  7.3  7.6   Hematocrit 36.0 - 46.0 % 24.6  24.2  25.5   Platelets 150 - 400 K/uL 288  318  307     RADS: N/a  Assessment:      Concerns for inadequate p.o. intake and malnutrition.  Consult placed for possible feeding tube placement.  Plan:     If there remains a persistent/recurrent concern for malnutrition secondary to inadequate oral intake, patient will most certainly benefit from supplemental feeding.  Unfortunately, my opinion is her current anatomy and concurrent ventral hernia will not allow placement of a gastrostomy or jejunostomy tube.  Unclear based on imaging around the time of initial PEG tube placement if she had less flexion secondary to her chronic contractures.  Recommend obtaining second opinion from GI provider that placed the initial PEG tube to see if he could potentially find a window for placement.  Further care per primary team.  Suprapubic wound otherwise looks well.  Continue  wound VAC changes for now.  labs/images/medications/previous chart entries reviewed personally and relevant changes/updates noted above.

## 2023-01-26 NOTE — Consult Note (Signed)
PHARMACY CONSULT NOTE - FOLLOW UP  Pharmacy Consult for Electrolyte Monitoring and Replacement   Recent Labs: Potassium (mmol/L)  Date Value  01/25/2023 3.5   Magnesium (mg/dL)  Date Value  33/82/5053 1.9   Calcium (mg/dL)  Date Value  97/67/3419 8.0 (L)   Albumin (g/dL)  Date Value  37/90/2409 3.3 (L)   Phosphorus (mg/dL)  Date Value  73/53/2992 2.4 (L)   Sodium (mmol/L)  Date Value  01/25/2023 141     Assessment: 52 y.o. female with medical history significant of baseline chronic cognitive deficit. Hx of TBI. Pt presented to the hospital with metabolic encephalopathy and sepsis. Pt is on carbamazepine, on free water 100 ml q4H. Monitor Na, Sodium has improved since admission.  On tube feeds.   Goal of Therapy:  WNL  Plan:  No new labs. Previous labs were stable no need for labs today.  F/u with AM labs.    Ronnald Ramp ,PharmD Clinical Pharmacist 01/26/2023 8:14 AM

## 2023-01-26 NOTE — TOC Progression Note (Signed)
Transition of Care [facility]) - Progression Note    Patient Details  Name: Joanne [name] MRN: [id number] Date of Birth: [birth year]  [facility] CM/SW Contact  Truddie Hidden, RN Phone Number: 01/26/2023, 12:48 PM  Clinical Narrative:    Case reviewed for DME needs and changes in discharge disposition. Will revaluate disposition post palliative care GOC family meeting today.    Expected Discharge Plan:  (TBD) Barriers to Discharge: Continued Medical Work up  Expected Discharge Plan and Services                                               Social Determinants of Health (SDOH) Interventions SDOH Screenings   Tobacco Use: Low Risk  (01/19/2023)    Readmission Risk Interventions     No data to display

## 2023-01-27 ENCOUNTER — Inpatient Hospital Stay: Payer: Medicare Other

## 2023-01-27 DIAGNOSIS — A419 Sepsis, unspecified organism: Secondary | ICD-10-CM | POA: Diagnosis not present

## 2023-01-27 DIAGNOSIS — L0291 Cutaneous abscess, unspecified: Secondary | ICD-10-CM | POA: Diagnosis not present

## 2023-01-27 DIAGNOSIS — D72829 Elevated white blood cell count, unspecified: Secondary | ICD-10-CM | POA: Diagnosis not present

## 2023-01-27 LAB — GLUCOSE, CAPILLARY
Glucose-Capillary: 121 mg/dL — ABNORMAL HIGH (ref 70–99)
Glucose-Capillary: 136 mg/dL — ABNORMAL HIGH (ref 70–99)
Glucose-Capillary: 138 mg/dL — ABNORMAL HIGH (ref 70–99)
Glucose-Capillary: 87 mg/dL (ref 70–99)
Glucose-Capillary: 120 mg/dL — ABNORMAL HIGH (ref 70–99)

## 2023-01-27 LAB — MAGNESIUM: Magnesium: 1.7 mg/dL (ref 1.7–2.4)

## 2023-01-27 LAB — PHOSPHORUS: Phosphorus: 3.4 mg/dL (ref 2.5–4.6)

## 2023-01-27 NOTE — Consult Note (Signed)
PHARMACY CONSULT NOTE - FOLLOW UP  Pharmacy Consult for Electrolyte Monitoring and Replacement   Recent Labs: Potassium (mmol/L)  Date Value  01/26/2023 4.4   Magnesium (mg/dL)  Date Value  23/36/1224 1.9   Calcium (mg/dL)  Date Value  49/75/3005 8.4 (L)   Albumin (g/dL)  Date Value  08/24/1116 3.3 (L)   Phosphorus (mg/dL)  Date Value  35/67/0141 2.4 (L)   Sodium (mmol/L)  Date Value  01/26/2023 139     Assessment: 52 y.o. female with medical history significant of baseline chronic cognitive deficit. Hx of TBI. Pt presented to the hospital with metabolic encephalopathy and sepsis. Pt is on carbamazepine, on free water 100 ml q4H. Monitor Na, Sodium has improved since admission.  On tube feeds.   Goal of Therapy:  WNL  Plan:  No new labs. Previous labs were stable no need for labs today.  F/u with AM labs.    Bettey Costa ,PharmD Clinical Pharmacist 01/27/2023 11:01 AM

## 2023-01-27 NOTE — Progress Notes (Signed)
[facility]  - [place] at [facility]   PATIENT NAME: Joanne [name]    MR#:  [id number]  DATE OF BIRTH:  10-30-1970  SUBJECTIVE:  patient very soft-spoken. No new complaints per RN. Breathing appropriately.no fever.  no family at bedside NG feeling + resumed after placing another NG tube    VITALS:  Blood pressure 110/74, pulse 88, temperature (!) 97.3 F (36.3 C), temperature source Axillary, resp. rate (!) 24, height 5\' 2"  (1.575 m), weight 64.9 kg, SpO2 98 %.  PHYSICAL EXAMINATION:   GENERAL:  52 year-old patient with no acute distress. contractures LUNGS: Normal breath sounds bilaterally CARDIOVASCULAR: S1, S2 normal ABDOMEN: Soft, wound vac + EXTREMITIES: contractures NEUROLOGIC: nonfocal  patient is alert, soft spoken SKIN:  Pressure Injury 01/17/23 Buttocks Right Stage 3 -  Full thickness tissue loss. Subcutaneous fat may be visible but bone, tendon or muscle are NOT exposed. 01/17/23; updated by WOC full thickness; Stage 3 (Active)  01/17/23 0430  Location: Buttocks  Location Orientation: Right  Staging: Stage 3 -  Full thickness tissue loss. Subcutaneous fat may be visible but bone, tendon or muscle are NOT exposed.  Wound Description (Comments): 01/17/23; updated by WOC full thickness; Stage 3  Present on Admission: Yes     Pressure Injury 01/17/23 Buttocks Right Stage 3 -  Full thickness tissue loss. Subcutaneous fat may be visible but bone, tendon or muscle are NOT exposed. 01/17/23 updated by WOC nurse; full thickness Stage 3 (Active)  01/17/23 0430  Location: Buttocks  Location Orientation: Right  Staging: Stage 3 -  Full thickness tissue loss. Subcutaneous fat may be visible but bone, tendon or muscle are NOT exposed.  Wound Description (Comments): 01/17/23 updated by WOC nurse; full thickness Stage 3  Present on Admission: Yes     LABORATORY PANEL:  CBC Recent Labs  Lab 01/25/23 0419  WBC 9.2  HGB 7.5*  HCT 24.6*  PLT 288      Chemistries  Recent Labs  Lab 01/26/23 0900 01/27/23 1130  NA 139  --   K 4.4  --   CL 102  --   CO2 29  --   GLUCOSE 104*  --   BUN 16  --   CREATININE 0.51  --   CALCIUM 8.4*  --   MG  --  1.7    Cardiac Enzymes No results for input(s): "TROPONINI" in the last 168 hours. RADIOLOGY:  DG Abd 1 View  Result Date: 01/26/2023 CLINICAL DATA:  NG tube EXAM: ABDOMEN - 1 VIEW COMPARISON:  Abdominal x-ray 01/24/2023. FINDINGS: Enteric tube tip is in the region of the gastric antrum. Exam is otherwise stable. IMPRESSION: Enteric tube tip is in the region of the gastric antrum. Electronically Signed   By: Darliss Cheney M.D.   On: 01/26/2023 17:47    Assessment and Plan  52 year old female with a history of TBI, chronic contractures and resident of a nursing home who presents to the hospital with toxic metabolic encephalopathy and septic shock secondary to suprapubic abscess. She underwent abscess drainage and wound VAC placement complicated by post-op respiratory failure and failure to wean off the ventilator.   CXR 01/16/2023: Patchy opacities in the right lower lung atelectasis versus infection.  Stable cardiomegaly CT CT abdomen pelvis with contrast 01/17/2023: Extensive soft tissue infiltration, soft tissue gas and loculated collections in the low anterior pelvic fat extending into the genital region consistent with Fournier's gas gangrene.  Delayed appearance of contrast material in  the renal collecting systems may indicate metabolic disease.  No evidence of obstruction.  Broad-based ventral abdominal wall hernia containing small and large bowel without proximal obstruct.  Severe Sepsis secondary to Suprapubic Abscess and suspected CAP (present on admission) --S/p Debridement x1 and wound reexploration with VAC placement -General Surgery following, appreciate input ~ wound care and VAC changes as per Surgery recommendations ~ "Recommend continued Monday Wednesday Friday wound VAC  changes until less depth noted in the wound. Wound ostomy nurse and/or floor nurse to continue changes for now. Surgery will peripherally follow. Will follow-up next week if patient still in-house. She can follow-up in the office as outpatient in 1 week if discharged prior to" -WBC down to normal 9.2 - empiric Zosyn completed rx for total 8 days.  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  --IR and gen surgery unable to place J tube due to her ventral hernia, contracture, recent surgery. --Spoke with Dr Baxter HireWohl--GI--he will try to place PEG/G-tube Tuesday am. --will cont NG feeding till then  Palliative care met with pt and son Joanne [name] on 01/26/2023 and continues to be FULL CODE  Procedures Family communication :none Consults :PCCM CODE STATUS: full DVT Prophylaxis :enoxaparin Level of care: Progressive Status is: Inpatient Remains inpatient appropriate because: sepsis    TOTAL TIME TAKING CARE OF THIS PATIENT: 35 minutes.  >50% time spent on counselling and coordination of care  Note: This dictation was prepared with Dragon dictation along with smaller phrase technology. Any transcriptional errors that result from this process are unintentional.  Enedina FinnerSona Milferd Ansell M.D    [facility]   CC: Primary care physician; Leonia ReaderVan Eyk, Barbara CowerJason, MD

## 2023-01-28 LAB — GLUCOSE, CAPILLARY
Glucose-Capillary: 103 mg/dL — ABNORMAL HIGH (ref 70–99)
Glucose-Capillary: 111 mg/dL — ABNORMAL HIGH (ref 70–99)
Glucose-Capillary: 111 mg/dL — ABNORMAL HIGH (ref 70–99)
Glucose-Capillary: 118 mg/dL — ABNORMAL HIGH (ref 70–99)
Glucose-Capillary: 122 mg/dL — ABNORMAL HIGH (ref 70–99)
Glucose-Capillary: 126 mg/dL — ABNORMAL HIGH (ref 70–99)
Glucose-Capillary: 135 mg/dL — ABNORMAL HIGH (ref 70–99)
Glucose-Capillary: 173 mg/dL — ABNORMAL HIGH (ref 70–99)
Glucose-Capillary: 52 mg/dL — ABNORMAL LOW (ref 70–99)

## 2023-01-29 DIAGNOSIS — L0291 Cutaneous abscess, unspecified: Secondary | ICD-10-CM | POA: Diagnosis not present

## 2023-01-29 DIAGNOSIS — D72829 Elevated white blood cell count, unspecified: Secondary | ICD-10-CM | POA: Diagnosis not present

## 2023-01-29 DIAGNOSIS — A419 Sepsis, unspecified organism: Secondary | ICD-10-CM | POA: Diagnosis not present

## 2023-01-29 LAB — BASIC METABOLIC PANEL
Anion gap: 7 (ref 5–15)
BUN: 19 mg/dL (ref 6–20)
CO2: 29 mmol/L (ref 22–32)
Calcium: 8.5 mg/dL — ABNORMAL LOW (ref 8.9–10.3)
Chloride: 101 mmol/L (ref 98–111)
Creatinine, Ser: 0.51 mg/dL (ref 0.44–1.00)
GFR, Estimated: >60 mL/min (ref >60)
Glucose, Bld: 109 mg/dL — ABNORMAL HIGH (ref 70–99)
Potassium: 4.2 mmol/L (ref 3.5–5.1)
Sodium: 137 mmol/L (ref 135–145)

## 2023-01-29 LAB — CBC
HCT: 27.3 % — ABNORMAL LOW (ref 36.0–46.0)
Hemoglobin: 8.3 g/dL — ABNORMAL LOW (ref 12.0–15.0)
MCH: 27.3 pg (ref 26.0–34.0)
MCHC: 30.4 g/dL (ref 30.0–36.0)
MCV: 89.8 fL (ref 80.0–100.0)
Platelets: 467 10*3/uL — ABNORMAL HIGH (ref 150–400)
RBC: 3.04 MIL/uL — ABNORMAL LOW (ref 3.87–5.11)
RDW: 17.2 % — ABNORMAL HIGH (ref 11.5–15.5)
WBC: 11.4 10*3/uL — ABNORMAL HIGH (ref 4.0–10.5)
nRBC: 0 % (ref 0.0–0.2)

## 2023-01-29 LAB — GLUCOSE, CAPILLARY
Glucose-Capillary: 100 mg/dL — ABNORMAL HIGH (ref 70–99)
Glucose-Capillary: 110 mg/dL — ABNORMAL HIGH (ref 70–99)
Glucose-Capillary: 116 mg/dL — ABNORMAL HIGH (ref 70–99)
Glucose-Capillary: 120 mg/dL — ABNORMAL HIGH (ref 70–99)
Glucose-Capillary: 120 mg/dL — ABNORMAL HIGH (ref 70–99)
Glucose-Capillary: 90 mg/dL (ref 70–99)

## 2023-01-29 MED ORDER — MORPHINE SULFATE (PF) 2 MG/ML IV SOLN
2.0000 mg | Freq: Once | INTRAVENOUS | Status: AC
  Administered 2023-01-31: 2 mg via INTRAVENOUS
  Filled 2023-01-29: qty 1

## 2023-01-29 NOTE — Consult Note (Signed)
WOC Nurse wound follow up Wound type: surgical  Measurement:2cm x 9cm x 2cm  Wound bed: clean, pale Drainage (amount, consistency, odor) serosanguinous in the NPWT canister Periwound:intact  Dressing procedure/placement/frequency: Removed old NPWT dressing (1pc black) Filled wound with  _1__ piece of black foam, Sealed NPWT dressing at HG Patient received per tube pain medication per bedside nurse prior to dressing change Patient tolerated procedure well  Ok for bedside nurse to change non-complex NPWT dressing M/W/F    Joanne [name] [facility], CNS, [employer] [phone]
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
## 2023-01-30 ENCOUNTER — Inpatient Hospital Stay: Payer: Medicare Other | Admitting: Anesthesiology

## 2023-01-30 ENCOUNTER — Encounter
Admission: EM | Disposition: A | Discharge status: Skilled Nursing Facility | Payer: Self-pay | Source: Skilled Nursing Facility | Attending: Internal Medicine

## 2023-01-30 ENCOUNTER — Inpatient Hospital Stay: Payer: Medicare Other

## 2023-01-30 DIAGNOSIS — Z7189 Other specified counseling: Secondary | ICD-10-CM | POA: Diagnosis not present

## 2023-01-30 DIAGNOSIS — R1310 Dysphagia, unspecified: Secondary | ICD-10-CM

## 2023-01-30 DIAGNOSIS — L0291 Cutaneous abscess, unspecified: Secondary | ICD-10-CM | POA: Diagnosis not present

## 2023-01-30 DIAGNOSIS — D72829 Elevated white blood cell count, unspecified: Secondary | ICD-10-CM | POA: Diagnosis not present

## 2023-01-30 DIAGNOSIS — A419 Sepsis, unspecified organism: Secondary | ICD-10-CM | POA: Diagnosis not present

## 2023-01-30 HISTORY — PX: PEG PLACEMENT: SHX5437

## 2023-01-30 LAB — GLUCOSE, CAPILLARY
Glucose-Capillary: 94 mg/dL (ref 70–99)
Glucose-Capillary: 90 mg/dL (ref 70–99)
Glucose-Capillary: 97 mg/dL (ref 70–99)
Glucose-Capillary: 116 mg/dL — ABNORMAL HIGH (ref 70–99)
Glucose-Capillary: 86 mg/dL (ref 70–99)
Glucose-Capillary: 141 mg/dL — ABNORMAL HIGH (ref 70–99)

## 2023-01-30 SURGERY — INSERTION, PEG TUBE
Anesthesia: Monitor Anesthesia Care

## 2023-01-30 MED ORDER — PHENYLEPHRINE 80 MCG/ML (10ML) SYRINGE FOR IV PUSH (FOR BLOOD PRESSURE SUPPORT)
PREFILLED_SYRINGE | INTRAVENOUS | Status: AC
  Filled 2023-01-30: qty 10

## 2023-01-30 MED ORDER — PROSOURCE TF20 ENFIT COMPATIBL EN LIQD
60.0000 mL | Freq: Every day | ENTERAL | Status: AC
  Administered 2023-01-30 – 2023-02-03 (×5): 60 mL

## 2023-01-30 MED ORDER — LIDOCAINE HCL (CARDIAC) PF 100 MG/5ML IV SOSY
PREFILLED_SYRINGE | INTRAVENOUS | Status: DC | PRN
  Administered 2023-01-30: 100 mg via INTRAVENOUS

## 2023-01-30 MED ORDER — SODIUM CHLORIDE 0.9 % IV SOLN: INTRAVENOUS | Status: DC

## 2023-01-30 MED ORDER — LIDOCAINE HCL (PF) 2 % IJ SOLN
INTRAMUSCULAR | Status: AC
  Filled 2023-01-30: qty 5

## 2023-01-30 MED ORDER — PROPOFOL 10 MG/ML IV BOLUS
INTRAVENOUS | Status: AC
  Filled 2023-01-30: qty 20

## 2023-01-30 MED ORDER — KETAMINE HCL 10 MG/ML IJ SOLN
INTRAMUSCULAR | Status: DC | PRN
  Administered 2023-01-30: 30 mg via INTRAVENOUS

## 2023-01-30 MED ORDER — VANCOMYCIN HCL IN DEXTROSE 1-5 GM/200ML-% IV SOLN
1000.0000 mg | Freq: Once | INTRAVENOUS | Status: AC
  Administered 2023-01-30: 1000 mg via INTRAVENOUS

## 2023-01-30 MED ORDER — OSMOLITE 1.5 CAL PO LIQD
1000.0000 mL | ORAL | Status: AC
  Administered 2023-01-30 – 2023-02-03 (×4): 1000 mL

## 2023-01-30 MED ORDER — JUVEN PO PACK
1.0000 | PACK | Freq: Two times a day (BID) | ORAL | Status: DC
  Administered 2023-01-30 – 2023-02-04 (×11): 1

## 2023-01-30 MED ORDER — VANCOMYCIN HCL IN DEXTROSE 1-5 GM/200ML-% IV SOLN
INTRAVENOUS | Status: AC
  Filled 2023-01-30: qty 200

## 2023-01-30 MED ORDER — MIDAZOLAM HCL 2 MG/2ML IJ SOLN
INTRAMUSCULAR | Status: DC | PRN
  Administered 2023-01-30: 1 mg via INTRAVENOUS

## 2023-01-30 MED ORDER — MIDAZOLAM HCL 2 MG/2ML IJ SOLN
INTRAMUSCULAR | Status: AC
  Filled 2023-01-30: qty 2

## 2023-01-30 MED ORDER — KETAMINE HCL 50 MG/5ML IJ SOSY
PREFILLED_SYRINGE | INTRAMUSCULAR | Status: AC
  Filled 2023-01-30: qty 5

## 2023-01-30 NOTE — OR Nursing (Signed)
REPORT TO PRIMARY NURSE,TERRI. I/S UNABLE TO PERFORM PEG AND NGT PLACED . I/S TO VERIFY PLACEMENT PRIOR TO ADM MEDS AND TUBE FEEDING, UNDERSTANDING VERBALIZED

## 2023-01-30 NOTE — Transfer of Care (Signed)
Immediate Anesthesia Transfer of Care Note  Patient: Joanne [name]  Procedure(s) Performed: PERCUTANEOUS ENDOSCOPIC GASTROSTOMY (PEG) PLACEMENT  Patient Location: Endoscopy Unit  Anesthesia Type:MAC  Level of Consciousness: drowsy  Airway & Oxygen Therapy: Patient Spontanous Breathing  Post-op Assessment: Report given to RN and Post -op Vital signs reviewed and stable  Post vital signs: Reviewed and stable  Last Vitals:  Vitals Value Taken Time  BP 131/68 01/30/23 1228  Temp 36.6 C 01/30/23 1228  Pulse 100 01/30/23 1229  Resp 23 01/30/23 1229  SpO2 99 % 01/30/23 1229  Vitals shown include unvalidated device data.  Last Pain:  Vitals:   01/30/23 1228  TempSrc: Temporal  PainSc:       Patients Stated Pain Goal: 0 (01/29/23 0048)  Complications: No notable events documented.

## 2023-01-30 NOTE — Progress Notes (Signed)
Daily Progress Note   Patient Name: Joanne [name]       Date: 01/30/2023 DOB: 18-Sep-1971  Age: 52 y.o. MRN#: [id number] Attending Physician: Enedina Finner, MD Primary Care Physician: Crist Fat, MD Admit Date: 01/16/2023  Reason for Consultation/Follow-up: Establishing goals of care  Subjective: Advised by team that a PEG tube was not able to be placed.  SLP has just evaluated patient with recommendations for a safe oral diet.  Unsure if overall intake will be enough to sustain.  Will need time for outcomes.  Length of Stay: 14  Current Medications: Scheduled Meds:   ascorbic acid  500 mg Per Tube BID   baclofen  10 mg Per Tube TID   carBAMazepine  50 mg Per Tube Q6H   Chlorhexidine Gluconate Cloth  6 each Topical Daily   famotidine  20 mg Per Tube BID   feeding supplement (PROSource TF20)  60 mL Per Tube Daily   free water  100 mL Per Tube Q4H   insulin aspart  0-6 Units Subcutaneous Q4H   levothyroxine  112 mcg Per Tube Q0600    morphine injection  2 mg Intravenous Once   nutrition supplement (JUVEN)  1 packet Per Tube BID BM   sertraline  25 mg Per Tube Daily   sodium chloride flush  3 mL Intravenous Q12H    Continuous Infusions:  feeding supplement (OSMOLITE 1.5 CAL)      PRN Meds: acetaminophen **OR** acetaminophen, docusate, mouth rinse  Physical Exam Pulmonary:     Effort: Pulmonary effort is normal.  Neurological:     Mental Status: She is alert.             Vital Signs: BP 131/68   Pulse 99   Temp 97.9 F (36.6 C) (Temporal)   Resp (!) 23   Ht 5\' 2"  (1.575 m)   Wt 66.6 kg   LMP  (LMP Unknown)   SpO2 100%   BMI 26.85 kg/m  SpO2: SpO2: 100 % O2 Device: O2 Device: Room Air O2 Flow Rate: O2 Flow Rate (L/min): 1 L/min  Intake/output summary:   Intake/Output Summary (Last 24 hours) at 01/30/2023 1541 Last data filed at 01/30/2023 1214 Gross per 24 hour  Intake 100 ml  Output 590 ml  Net -490 ml   LBM: Last BM  Date : 01/28/23 Baseline Weight: Weight: 70 kg Most recent weight: Weight: 66.6 kg          Patient Active Problem List   Diagnosis Date Noted   Abdominal wall cellulitis 01/17/2023   Abscess 01/17/2023   Pneumonia 01/16/2023   Sepsis 01/16/2023   Leukocytosis 01/16/2023   HCAP (healthcare-associated pneumonia) 07/04/2021   Aspiration pneumonia 07/04/2021   Chronic diastolic CHF (congestive heart failure) 07/04/2021   Acute metabolic encephalopathy 07/04/2021   Hyperkalemia 07/04/2021   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   Wound, open, in back and left leg 07/04/2021   Chronic indwelling Foley catheter    Leaking PEG tube    Failure to thrive in adult 04/24/2021   Requires assistance with all daily activities 04/24/2021   G tube feedings 04/24/2021   Closed injury of head 09/06/2020   Seizure disorder 09/06/2020   Vitamin D deficiency 09/06/2020   Mild malnutrition    Protein-calorie malnutrition, severe 05/20/2020   Weakness    Full code status    Hypokalemia 05/16/2020   Severe sepsis with septic shock 05/15/2020   Sacral decubitus ulcer, stage IV    Goals of care, counseling/discussion    Palliative care by specialist    DNR (do not resuscitate) discussion    Septic shock 03/02/2020   Hypernatremia 11/15/2019   Thrombocytopenia 11/15/2019   AKI (acute kidney injury) 11/15/2019   HTN (hypertension) 11/15/2019   Non-insulin dependent type 2 diabetes mellitus 11/15/2019   Hypothyroidism 11/15/2019   Severe sepsis with acute organ dysfunction 11/15/2019   Left hemiplegia 11/15/2019   Pressure injury of skin 11/15/2019   Aspiration pneumonia due to gastric secretions    Acute kidney injury (AKI) with acute tubular necrosis (ATN)    Traumatic brain injury    Abdominal  pain 08/05/2019   Vomiting 08/05/2019   Acute on chronic respiratory failure with hypoxia 06/06/2018   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   Pneumonia due to Klebsiella pneumoniae 06/06/2018   Acute on chronic combined systolic and diastolic CHF (congestive heart failure) 06/06/2018   Sepsis secondary to UTI 06/06/2018   ARDS (adult respiratory distress syndrome) 06/06/2018   Acute respiratory failure with hypoxia 06/06/2018   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 Will need time for outcomes, PMT will shadow  Code Status:    Code Status Orders  (From admission, onward)           Start     Ordered   01/16/23 2339  Full code  Continuous       Question:  By:  Answer:  Default: patient does not have capacity for decision making, no surrogate or prior directive available   01/16/23 2339           Code Status History     Date Active Date Inactive Code Status Order ID Comments User Context   07/04/2021 1640 07/12/2021 1925 Full Code [id number]  Lorretta HarpNiu, Xilin, MD ED   04/24/2021 1303 04/30/2021 2150 Full Code [id number]  Cox, Amy Dorris CarnesN, DO ED   05/15/2020 1905 06/10/2020 2329 Full Code [id number]  Pieter Partridgehatterjee, Srobona Tublu, MD  ED   03/02/2020 1721 03/16/2020 2328 Full Code [id number]  Erin Fulling, MD ED   11/15/2019 0253 11/21/2019 0411 Full Code [id number]  Andris Baumann, MD ED   09/10/2019 1522 10/16/2019 1653 Full Code [id number]  [place], [facility] Inpatient    Care plan was discussed with SLP and primary  Thank you for allowing the Palliative Medicine Team to assist in  the care of this patient.   Morton Stall, NP  Please contact Palliative Medicine Team phone at [phone] for questions and concerns.
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
## 2023-01-30 NOTE — Plan of Care (Signed)
Patient currently in OR for PEG. Continue full code/ full scope. PMT will follow up Thursday on my return to service.

## 2023-01-30 NOTE — Progress Notes (Signed)
Per Dr Darcel Bayley to place PEG tube due to Ventral hernia in the transillumination window. NG replaced and TF to be resumed. D/w ST to try oral feeds High risk for aspiration  [name] and Fredrik Cove are aware of Limited option for feeding.

## 2023-01-30 NOTE — Anesthesia Postprocedure Evaluation (Signed)
Anesthesia Post Note  Patient: Joanne [name]  Procedure(s) Performed: PERCUTANEOUS ENDOSCOPIC GASTROSTOMY (PEG) PLACEMENT  Patient location during evaluation: Endoscopy Anesthesia Type: MAC Level of consciousness: awake and alert Pain management: pain level controlled Vital Signs Assessment: post-procedure vital signs reviewed and stable Respiratory status: spontaneous breathing, nonlabored ventilation and respiratory function stable Cardiovascular status: blood pressure returned to baseline and stable Postop Assessment: no apparent nausea or vomiting Anesthetic complications: no   No notable events documented.   Last Vitals:  Vitals:   01/30/23 0802 01/30/23 1228  BP: 130/84 131/68  Pulse: 99   Resp: 18 (!) 23  Temp: 36.7 C 36.6 C  SpO2: 99% 100%    Last Pain:  Vitals:   01/30/23 1228  TempSrc: Temporal  PainSc: 0-No pain                 Foye Deer

## 2023-01-30 NOTE — Progress Notes (Signed)
NG tube placed by anesthesia CRNA @ 65 right nare  fter EGD

## 2023-01-30 NOTE — Anesthesia Preprocedure Evaluation (Addendum)
Anesthesia Evaluation  Patient identified by MRN, date of birth, ID band Patient confused    Reviewed: Allergy & Precautions, H&P , NPO status , Patient's Chart, lab work & pertinent test results  Airway Mallampati: III  TM Distance: >3 FB Neck ROM: Limited   Comment: Head contracted to Right with a small amount of mobility towards midline Dental  (+) Poor Dentition, Missing   Pulmonary  Recent Post-Op Respiratory Failure in the setting of severe Sepsis & and suspected CAP  Tracheostomy healed  + rhonchi        Cardiovascular +CHF  Normal cardiovascular exam     Neuro/Psych Seizures - (Chronic Seizure Disorder),        Acute Metabolic EncephalopathyH/o TBI, chronic contractures    GI/Hepatic negative GI ROS, Neg liver ROS,,,  Endo/Other  diabetes, Type 2Hypothyroidism    Renal/GU negative Renal ROS  negative genitourinary   Musculoskeletal   Abdominal   Peds  Hematology  (+) Blood dyscrasia, anemia   Anesthesia Other Findings Pt presented to the hospital with toxic metabolic encephalopathy and septic shock secondary to suprapubic abscess. She underwent abscess drainage and wound VAC placement complicated by post-op respiratory failure and failure to wean off the ventilator. Pt has been stable for days now in need of PEG tube.    Past Medical History: No date: Acute kidney injury (AKI) with acute tubular necrosis (ATN)  (HCC) 06/06/2018: Acute on chronic combined systolic and diastolic CHF  (congestive heart failure) (HCC) 06/06/2018: Acute on chronic respiratory failure with hypoxia (HCC) 06/06/2018: ARDS (adult respiratory distress syndrome) (HCC) No date: Aspiration pneumonia due to gastric secretions (HCC) No date: Diabetes mellitus (HCC) No date: History of traumatic brain injury No date: Hypothyroidism 06/06/2018: Pneumonia due to Klebsiella pneumoniae (HCC) No date: Seizure disorder (HCC) 06/06/2018: Severe  sepsis (HCC) 06/06/2018: Tracheostomy status (HCC) No date: Traumatic brain injury [facility])  Past Surgical History: 01/19/2023: APPLICATION OF WOUND VAC; N/A     Comment:  Procedure: APPLICATION OF WOUND VAC;  Surgeon: Sung Amabile, DO;  Location: [facility] ORS;  Service: General;                Laterality: N/A; 01/17/2023: INCISION AND DRAINAGE OF WOUND; N/A     Comment:  Procedure: DEBRIDEMENT Fournier gangrene;  Surgeon:               Sung Amabile, DO;  Location: [facility] ORS;  Service: General;              Laterality: N/A; 07/11/2021: IR REPLC GASTRO/COLONIC TUBE PERCUT W/FLUORO 05/27/2020: PEG PLACEMENT; N/A     Comment:  Procedure: PERCUTANEOUS ENDOSCOPIC GASTROSTOMY (PEG)               PLACEMENT;  Surgeon: Midge Minium, MD;  Location: [facility]               ENDOSCOPY;  Service: Endoscopy;  Laterality: N/A; 08/29/2022: PERCUTANEOUS ENDOSCOPIC GASTROSTOMY (PEG) REMOVAL; N/A     Comment:  Procedure: PERCUTANEOUS ENDOSCOPIC GASTROSTOMY (PEG)               REMOVAL;  Surgeon: Midge Minium, MD;  Location: [facility]               ENDOSCOPY;  Service: Endoscopy;  Laterality: N/A; No date: TRACHEOSTOMY 01/19/2023: WOUND EXPLORATION; N/A     Comment:  Procedure: WOUND EXPLORATION;  Surgeon: Sung Amabile,  DO;  Location: [facility] ORS;  Service: General;  Laterality:               N/A;  BMI    Body Mass Index: 26.17 kg/m      Reproductive/Obstetrics negative OB ROS                              Anesthesia Physical Anesthesia Plan  ASA: 4  Anesthesia Plan: General   Post-op Pain Management: Minimal or no pain anticipated   Induction: Intravenous  PONV Risk Score and Plan: TIVA  Airway Management Planned: Natural Airway  Additional Equipment:   Intra-op Plan:   Post-operative Plan: Extubation in OR and Possible Post-op intubation/ventilation  Informed Consent: I have reviewed the patients History and Physical, chart, labs and discussed the procedure  including the risks, benefits and alternatives for the proposed anesthesia with the patient or authorized representative who has indicated his/her understanding and acceptance.     Dental Advisory Given and Consent reviewed with POA  Plan Discussed with: CRNA and Surgeon  Anesthesia Plan Comments: (Reviewed recent anesthesia records of successful intubations with mcgrath blade. Will proceed with TIVA with back-up airway equipment immediately available. )         Anesthesia Quick Evaluation

## 2023-01-30 NOTE — Progress Notes (Signed)
[facility]  - [place] [facility]   PATIENT NAME: Joanne [name]    MR#:  [id number]  DATE OF BIRTH:  08/28/1971  SUBJECTIVE:  No new complaints per RN.  no family at bedside NG feeding held overnite Pt had abd wound dressing change by WOC  Getting PEG placed today   VITALS:  Blood pressure 130/84, pulse 99, temperature 98 F (36.7 C), temperature source Oral, resp. rate 18, height 5\' 2"  (1.575 m), weight 66.6 kg, SpO2 99 %.  PHYSICAL EXAMINATION:   GENERAL:  52 y.o.-year-old patient with no acute distress. contractures LUNGS: Normal breath sounds bilaterally CARDIOVASCULAR: S1, S2 normal ABDOMEN: Soft, wound vac + EXTREMITIES: contractures NEUROLOGIC: nonfocal  patient is alert, soft spoken SKIN:  Pressure Injury 01/17/23 Buttocks Right Stage 3 -  Full thickness tissue loss. Subcutaneous fat may be visible but bone, tendon or muscle are NOT exposed. 01/17/23; updated by WOC full thickness; Stage 3 (Active)  01/17/23 0430  Location: Buttocks  Location Orientation: Right  Staging: Stage 3 -  Full thickness tissue loss. Subcutaneous fat may be visible but bone, tendon or muscle are NOT exposed.  Wound Description (Comments): 01/17/23; updated by WOC full thickness; Stage 3  Present on Admission: Yes     Pressure Injury 01/17/23 Buttocks Right Stage 3 -  Full thickness tissue loss. Subcutaneous fat may be visible but bone, tendon or muscle are NOT exposed. 01/17/23 updated by WOC nurse; full thickness Stage 3 (Active)  01/17/23 0430  Location: Buttocks  Location Orientation: Right  Staging: Stage 3 -  Full thickness tissue loss. Subcutaneous fat may be visible but bone, tendon or muscle are NOT exposed.  Wound Description (Comments): 01/17/23 updated by WOC nurse; full thickness Stage 3  Present on Admission: Yes     LABORATORY PANEL:  CBC Recent Labs  Lab 01/29/23 1100  WBC 11.4*  HGB 8.3*  HCT 27.3*  PLT 467*     Chemistries  Recent Labs  Lab  01/27/23 1130 01/29/23 1100  NA  --  137  K  --  4.2  CL  --  101  CO2  --  29  GLUCOSE  --  109*  BUN  --  19  CREATININE  --  0.51  CALCIUM  --  8.5*  MG 1.7  --      RADIOLOGY:  No results found.  Assessment and Plan  52 year old female with a history of TBI, chronic contractures and resident of a nursing home who presents to the hospital with toxic metabolic encephalopathy and septic shock secondary to suprapubic abscess. She underwent abscess drainage and wound VAC placement complicated by post-op respiratory failure and failure to wean off the ventilator.   CXR 01/16/2023: Patchy opacities in the right lower lung atelectasis versus infection.  Stable cardiomegaly CT CT abdomen pelvis with contrast 01/17/2023: Extensive soft tissue infiltration, soft tissue gas and loculated collections in the low anterior pelvic fat extending into the genital region consistent with Fournier's gas gangrene.  Delayed appearance of contrast material in the renal collecting systems may indicate metabolic disease.  No evidence of obstruction.  Broad-based ventral abdominal wall hernia containing small and large bowel without proximal obstruct.  Severe Sepsis secondary to Suprapubic Abscess and suspected CAP (present on admission) --S/p Debridement x1 and wound reexploration with VAC placement -General Surgery following, appreciate input ~ wound care and VAC changes as per Surgery recommendations ~ "Recommend continued Monday Wednesday Friday wound VAC changes until less depth  noted in the wound. Wound ostomy nurse and/or floor nurse to continue changes for now. Surgery will peripherally follow. Will follow-up next week if patient still in-house. She can follow-up in the office as outpatient in 1 week if discharged prior to" -WBC down to normal 9.2 - empiric Zosyn completed rx for total 8 days.  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  --IR and gen surgery unable to place J tube due to her ventral hernia, contracture, recent surgery. --Spoke with Dr Joanne [name] will try to place PEG/G-tube today  Palliative care met with pt , sister Joanne [name] and Joanne [name] on 01/26/2023 and continues to be FULL CODE  Family communication :sister Joanne [name] Consults :PCCM, GI dr Joanne [name] CODE STATUS: full DVT Prophylaxis :enoxaparin Level of care: Progressive Status is: Inpatient Remains inpatient appropriate because: PEG placement on 01/30/2023  Pt can be discharged back to her LTC once PEG feeding resumed.    TOTAL TIME TAKING CARE OF THIS PATIENT: 35 minutes.  >50% time spent on counselling and coordination of care  Note: This dictation was prepared with Dragon dictation along with smaller phrase technology. Any transcriptional errors that result from this process are unintentional.  Joanne [name] M.D    [facility]   CC: Primary care physician; Joanne [name], Joanne Cower, MD

## 2023-01-30 NOTE — Op Note (Signed)
[facility] Gastroenterology Patient Name: Joanne [name] Procedure Date: 01/30/2023 11:47 AM MRN: [id number] Account #: [id number] Date of Birth: 04/18/1971 Admit Type: Outpatient Age: 52 Room: [facility] ENDO ROOM 1 Gender: Female Note Status: Finalized Instrument Name: Upper Endoscope [phone] Procedure:             Upper GI endoscopy Indications:           Dysphagia, Place PEG due to dysphagia Providers:             Midge Minium MD, MD Medicines:             Propofol per Anesthesia Complications:         No immediate complications. Procedure:             Pre-Anesthesia Assessment:                        - Prior to the procedure, a History and Physical was                         performed, and patient medications and allergies were                         reviewed. The patient's tolerance of previous                         anesthesia was also reviewed. The risks and benefits                         of the procedure and the sedation options and risks                         were discussed with the patient. All questions were                         answered, and informed consent was obtained. Prior                         Anticoagulants: The patient has taken no anticoagulant                         or antiplatelet agents. ASA Grade Assessment: II - A                         patient with mild systemic disease. After reviewing                         the risks and benefits, the patient was deemed in                         satisfactory condition to undergo the procedure.                        After obtaining informed consent, the endoscope was                         passed under direct vision. Throughout the procedure,  the patient's blood pressure, pulse, and oxygen                         saturations were monitored continuously. The                         Endosonoscope was introduced through the mouth, and                         advanced to  the second part of duodenum. The upper GI                         endoscopy was accomplished without difficulty. The                         patient tolerated the procedure well. Findings:      The esophagus was normal.      A scar was found in the gastric body.      The examined duodenum was normal. Impression:            - Normal esophagus.                        - Scar in the gastric body.                        - Normal examined duodenum.                        - No specimens collected.                        - PEG could not be placed due to the translumination                         being at the site of a large hernia Recommendation:        - Return patient to hospital ward for ongoing care. Procedure Code(s):     --- Professional ---                        [phone], Esophagogastroduodenoscopy, flexible,                         transoral; diagnostic, including collection of                         specimen(s) by brushing or washing, when performed                         (separate procedure) Diagnosis Code(s):     --- Professional ---                        R13.10, Dysphagia, unspecified CPT copyright 2022 American Medical Association. All rights reserved. The codes documented in this report are preliminary and upon coder review may  be revised to meet current compliance requirements. Midge Minium MD, MD 01/30/2023 12:14:23 PM This report has been signed electronically. Number of Addenda: 0 Note Initiated On: 01/30/2023 11:47 AM Estimated Blood Loss:  Estimated blood loss: none.      [facility]

## 2023-01-31 ENCOUNTER — Encounter: Payer: Self-pay | Admitting: Gastroenterology

## 2023-01-31 DIAGNOSIS — J189 Pneumonia, unspecified organism: Secondary | ICD-10-CM | POA: Diagnosis not present

## 2023-01-31 LAB — GLUCOSE, CAPILLARY
Glucose-Capillary: 135 mg/dL — ABNORMAL HIGH (ref 70–99)
Glucose-Capillary: 135 mg/dL — ABNORMAL HIGH (ref 70–99)
Glucose-Capillary: 132 mg/dL — ABNORMAL HIGH (ref 70–99)
Glucose-Capillary: 119 mg/dL — ABNORMAL HIGH (ref 70–99)
Glucose-Capillary: 94 mg/dL (ref 70–99)
Glucose-Capillary: 103 mg/dL — ABNORMAL HIGH (ref 70–99)
Glucose-Capillary: 114 mg/dL — ABNORMAL HIGH (ref 70–99)

## 2023-01-31 MED ORDER — FREE WATER
135.0000 mL | Status: DC
  Administered 2023-01-31 – 2023-02-05 (×28): 135 mL
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
## 2023-01-31 NOTE — Progress Notes (Signed)
Progress Note   Patient: Joanne [name] DOB: 05-14-1971 DOA: 01/16/2023     15 DOS: the patient was [name] and examined on 01/31/2023   Brief hospital course:  Joanne [name] is a 52 y.o. female with medical history significant of baseline chronic cognitive deficit, patient is under guardianship and bedbound with some contractures.  Prior traumatic brain injury.  And seizure disorder.  Patient is a resident of a skilled nursing facility.  Patient is transferred to [facility] for evaluation of altered mental status and finding of fever and patient.  As well as apparent electrolyte abnormalities at the skilled nursing facility.  Patient was being treated for pneumonia at outside facility based on a chest x-ray that was done the day prior.   ER course is notable for patient being found to be "septic ", status close fluid and antibiotic administration.  At this time patient does not offer any complaint please note that the patient is very limited in terms of her verbal status.  Maybe gives one-word answers or copies what I say.   Assessment and Plan: Severe Sepsis secondary to Suprapubic Abscess and suspected CAP (present on admission) --S/p Debridement x1 and wound reexploration with VAC placement -General Surgery following, appreciate input ~ wound care and VAC changes as per Surgery recommendations ~ "Recommend continued Monday Wednesday Friday wound VAC changes until less depth noted in the wound. Wound ostomy nurse and/or floor nurse to continue changes for now. Surgery will peripherally follow. Will follow-up next week if patient still in-house. She can follow-up in the office as outpatient in 1 week if discharged prior to" -WBC down to normal 9.2 - empiric Zosyn completed rx for total 8 days.  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  Patient back to baseline mental status and is responsive. -Continue Baclofen   Post-Op Respiratory Failure in the setting of severe Sepsis & and suspected CAP -pt on [place] oxygen   H/o TBI, bed bound with contractures At risk for malnutrition  --pt had PEG tube which was removed in 08/2022 --she has had d/w Pallaitive care NP today and would like PEG (J tube) feeding.  --IR, GI and gen surgery unable to place J tube due to her ventral hernia, contracture, recent surgery. -- Patient was reassessed by speech therapy and they note that patient remains in moderate aspiration risk but have recommended a full liquid diet with nectar thick liquids and strict aspiration precautions.  Administer meds crushed in pure. Speech--speech therapy will follow-up in the next 1 to 3 days to assess for diet tolerance and potential upgrade pending NG tube removal.  --Appreciate dietitian input, patient remains on tube feeds via NG tube and is receiving Osmolite 1.5 at 50 mL an hour, 60 mL Prosource daily and free water flushes 135 mL every 4 hours due to inadequate oral intake Palliative care met with pt , sister Joanne [name] and Joanne [name] on 01/26/2023 and continues to be FULL CODE        Subjective: Patient is [name] and examined at the bedside.  She complains of generalized pain.  Physical Exam: Vitals:   01/31/23 0800 01/31/23 1108 01/31/23 1152 01/31/23 1525  BP: 100/60 (!) 107/57  92/63  Pulse: (!) 101 (!) 103  (!) 101  Resp: 18  16  18  Temp: 98.9 F (37.2 C) (!) 97.5 F (36.4 C)  98.7 F (37.1 C)  TempSrc:      SpO2: 99% 97%  100%  Weight:   69.3 kg   Height:       GENERAL:  52 y.o.-year-old patient with no acute distress. contractures LUNGS: Normal breath sounds bilaterally CARDIOVASCULAR: S1, S2 normal ABDOMEN: Soft, wound vac + EXTREMITIES:  contractures NEUROLOGIC: nonfocal  patient is alert, soft spoken SKIN:  Pressure Injury 01/17/23 Buttocks Right Stage 3 -  Full thickness tissue loss. Subcutaneous fat may be visible but bone, tendon or muscle are NOT exposed. 01/17/23; updated by WOC full thickness; Stage 3 (Active)  01/17/23 0430  Location: Buttocks  Location Orientation: Right  Staging: Stage 3 -  Full thickness tissue loss. Subcutaneous fat may be visible but bone, tendon or muscle are NOT exposed.  Wound Description (Comments): 01/17/23; updated by WOC full thickness; Stage 3  Present on Admission: Yes     Pressure Injury 01/17/23 Buttocks Right Stage 3 -  Full thickness tissue loss. Subcutaneous fat may be visible but bone, tendon or muscle are NOT exposed. 01/17/23 updated by WOC nurse; full thickness Stage 3 (Active)  01/17/23 0430  Location: Buttocks  Location Orientation: Right  Staging: Stage 3 -  Full thickness tissue loss. Subcutaneous fat may be visible but bone, tendon or muscle are NOT exposed.  Wound Description (Comments): 01/17/23 updated by WOC nurse; full thickness Stage 3  Present on Admission: Yes     Data Reviewed: Labs reviewed There are no new results to review at this time.  Family Communication: Called and spoke to patient's guardian Joanne [name] and her son Joanne [name].  They understand that she remains at high risk for aspiration and that we are unable to place a PEG tube at this time due to her ventral hernia.  They also understand that speech therapy has recommended a diet and patient will be monitored for several days to make sure she is tolerating the diet and getting enough calories prior to discharge back to skilled nursing facility. All questions and concerns have been addressed.  Disposition: Status is: Inpatient Remains inpatient appropriate because: Requiring tube feeds through NG tube  Planned Discharge Destination:     Time spent: 35 minutes  Author: Lucile Shutters, MD 01/31/2023 3:28 PM  For on call review [email or web address].

## 2023-01-31 NOTE — Consult Note (Signed)
WOC Nurse wound follow up Wound type:surgical  Measurement:2cm xm 8cm x 2cm  Wound QDU:KRCV, early granulation  Drainage (amount, consistency, odor) serosanguinous in canister, canister changed  Periwound:intact  Dressing procedure/placement/frequency: Removed old NPWT dressing (1pc black) Periwound skin protected with skin barrier wipe  Filled wound with  _1__ piece of black foam Sealed NPWT dressing at HG Patient received IV pain medication per bedside nurse prior to dressing change Patient tolerated procedure well  Ok for bedside nurse to change non-complex NPWT dressing M/W/F    Suraiya Dickerson [facility], CNS, [employer] [phone]

## 2023-02-01 DIAGNOSIS — Z7189 Other specified counseling: Secondary | ICD-10-CM | POA: Diagnosis not present

## 2023-02-01 DIAGNOSIS — J189 Pneumonia, unspecified organism: Secondary | ICD-10-CM | POA: Diagnosis not present

## 2023-02-01 LAB — CBC
HCT: 27.4 % — ABNORMAL LOW (ref 36.0–46.0)
Hemoglobin: 8.5 g/dL — ABNORMAL LOW (ref 12.0–15.0)
MCH: 27.9 pg (ref 26.0–34.0)
MCHC: 31 g/dL (ref 30.0–36.0)
MCV: 89.8 fL (ref 80.0–100.0)
Platelets: 381 10*3/uL (ref 150–400)
RBC: 3.05 MIL/uL — ABNORMAL LOW (ref 3.87–5.11)
RDW: 17 % — ABNORMAL HIGH (ref 11.5–15.5)
WBC: 10.2 10*3/uL (ref 4.0–10.5)
nRBC: 0 % (ref 0.0–0.2)

## 2023-02-01 LAB — BASIC METABOLIC PANEL
Anion gap: 8 (ref 5–15)
BUN: 23 mg/dL — ABNORMAL HIGH (ref 6–20)
CO2: 27 mmol/L (ref 22–32)
Calcium: 8.2 mg/dL — ABNORMAL LOW (ref 8.9–10.3)
Chloride: 97 mmol/L — ABNORMAL LOW (ref 98–111)
Creatinine, Ser: 0.47 mg/dL (ref 0.44–1.00)
GFR, Estimated: >60 mL/min (ref >60)
Glucose, Bld: 125 mg/dL — ABNORMAL HIGH (ref 70–99)
Potassium: 3.9 mmol/L (ref 3.5–5.1)
Sodium: 132 mmol/L — ABNORMAL LOW (ref 135–145)

## 2023-02-01 LAB — GLUCOSE, CAPILLARY
Glucose-Capillary: 118 mg/dL — ABNORMAL HIGH (ref 70–99)
Glucose-Capillary: 130 mg/dL — ABNORMAL HIGH (ref 70–99)
Glucose-Capillary: 133 mg/dL — ABNORMAL HIGH (ref 70–99)
Glucose-Capillary: 88 mg/dL (ref 70–99)
Glucose-Capillary: 138 mg/dL — ABNORMAL HIGH (ref 70–99)

## 2023-02-01 MED ORDER — NEPRO/CARBSTEADY PO LIQD
237.0000 mL | Freq: Two times a day (BID) | ORAL | Status: DC
  Administered 2023-02-02 – 2023-02-09 (×15): 237 mL via ORAL
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
## 2023-02-01 NOTE — Progress Notes (Signed)
Daily Progress Note   Patient Name: Joanne [name]       Date: 02/01/2023 DOB: 1971/06/11  Age: 52 y.o. MRN#: [id number] Attending Physician: Lucile Shutters, MD Primary Care Physician: Crist Fat, MD Admit Date: 01/16/2023  Reason for Consultation/Follow-up: Establishing goals of care  Subjective: Patient is resting in bed. She is currently tolerating an oral diet. Full code/ full scope as per legal guardian, time for outcomes.   Length of Stay: 16  Current Medications: Scheduled Meds:   ascorbic acid  500 mg Per Tube BID   baclofen  10 mg Per Tube TID   carBAMazepine  50 mg Per Tube Q6H   Chlorhexidine Gluconate Cloth  6 each Topical Daily   famotidine  20 mg Per Tube BID   feeding supplement (PROSource TF20)  60 mL Per Tube Daily   free water  135 mL Per Tube Q4H   insulin aspart  0-6 Units Subcutaneous Q4H   levothyroxine  112 mcg Per Tube Q0600   nutrition supplement (JUVEN)  1 packet Per Tube BID BM   sertraline  25 mg Per Tube Daily   sodium chloride flush  3 mL Intravenous Q12H    Continuous Infusions:  feeding supplement (OSMOLITE 1.5 CAL) 50 mL/hr at 01/31/23 1543    PRN Meds: acetaminophen **OR** acetaminophen, docusate, mouth rinse  Physical Exam Pulmonary:     Effort: Pulmonary effort is normal.  Neurological:     Mental Status: She is alert.             Vital Signs: BP 115/75 (BP Location: Right Arm)   Pulse 95   Temp 98.4 F (36.9 C) (Oral)   Resp 18   Ht 5\' 2"  (1.575 m)   Wt 71.7 kg   LMP  (LMP Unknown)   SpO2 99%   BMI 28.91 kg/m  SpO2: SpO2: 99 % O2 Device: O2 Device: Room Air O2 Flow Rate: O2 Flow Rate (L/min): 1 L/min  Intake/output summary:  Intake/Output Summary (Last 24 hours) at 02/01/2023 0949 Last data filed at 01/31/2023  2332 Gross per 24 hour  Intake 3 ml  Output 625 ml  Net -622 ml   LBM: Last BM Date : 01/31/23 Baseline Weight: Weight: 70 kg Most recent weight: Weight: 71.7 kg  Patient Active Problem List   Diagnosis Date Noted   Abdominal wall cellulitis 01/17/2023   Abscess 01/17/2023   Pneumonia 01/16/2023   Sepsis 01/16/2023   Leukocytosis 01/16/2023   HCAP (healthcare-associated pneumonia) 07/04/2021   Aspiration pneumonia 07/04/2021   Chronic diastolic CHF (congestive heart failure) 07/04/2021   Acute metabolic encephalopathy 07/04/2021   Hyperkalemia 07/04/2021   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   Wound, open, in back and left leg 07/04/2021   Chronic indwelling Foley catheter    Leaking PEG tube    Failure to thrive in adult 04/24/2021   Requires assistance with all daily activities 04/24/2021   G tube feedings 04/24/2021   Closed injury of head 09/06/2020   Seizure disorder 09/06/2020   Vitamin D deficiency 09/06/2020   Mild malnutrition    Protein-calorie malnutrition, severe 05/20/2020   Weakness    Full code status    Hypokalemia 05/16/2020   Severe sepsis with septic shock 05/15/2020   Sacral decubitus ulcer, stage IV    Goals of care, counseling/discussion    Palliative care by specialist    DNR (do not resuscitate) discussion    Septic shock 03/02/2020   Hypernatremia 11/15/2019   Thrombocytopenia 11/15/2019   AKI (acute kidney injury) 11/15/2019   HTN (hypertension) 11/15/2019   Non-insulin dependent type 2 diabetes mellitus 11/15/2019   Hypothyroidism 11/15/2019   Severe sepsis with acute organ dysfunction 11/15/2019   Left hemiplegia 11/15/2019   Pressure injury of skin 11/15/2019   Aspiration pneumonia due to gastric secretions    Acute kidney injury (AKI) with acute tubular necrosis (ATN)    Traumatic brain injury    Abdominal pain 08/05/2019   Vomiting 08/05/2019   Acute on chronic respiratory failure with hypoxia  06/06/2018   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   Pneumonia due to Klebsiella pneumoniae 06/06/2018   Acute on chronic combined systolic and diastolic CHF (congestive heart failure) 06/06/2018   Sepsis secondary to UTI 06/06/2018   ARDS (adult respiratory distress syndrome) 06/06/2018   Acute respiratory failure with hypoxia 06/06/2018   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  Tim for outcomes  Code Status:    Code Status Orders  (From admission, onward)           Start     Ordered   01/16/23 2339  Full code  Continuous       Question:  By:  Answer:  Default: patient does not have capacity for decision making, no surrogate or prior directive available   01/16/23 2339           Code Status History     Date Active Date Inactive Code Status Order ID Comments User Context   07/04/2021 1640 07/12/2021 1925 Full Code [id number]  Lorretta Harp, MD ED   04/24/2021 1303 04/30/2021 2150 Full Code [id number]  Cox, Amy N, DO ED   05/15/2020 1905 06/10/2020 2329 Full Code [id number]  Pieter Partridge, MD ED   03/02/2020 1721 03/16/2020 2328 Full Code [id number]  Erin Fulling, MD ED   11/15/2019 0253 11/21/2019 0411 Full Code [id number]  Lindajo Royal  V, MD ED   09/10/2019 1522 10/16/2019 1653 Full Code [id number]  Ortencia KickSoto-Rivera, Wanda Inpatient       Prognosis:  Unable to determine    Thank you for allowing the Palliative Medicine Team to assist in the care of this patient.   Morton Stallrystal Ritisha Deitrick, NP  Please contact Palliative Medicine Team phone at [phone] for  questions and concerns.
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
## 2023-02-02 ENCOUNTER — Inpatient Hospital Stay: Payer: Medicare Other

## 2023-02-02 DIAGNOSIS — J189 Pneumonia, unspecified organism: Secondary | ICD-10-CM | POA: Diagnosis not present

## 2023-02-02 DIAGNOSIS — Z7189 Other specified counseling: Secondary | ICD-10-CM | POA: Diagnosis not present

## 2023-02-02 LAB — GLUCOSE, CAPILLARY
Glucose-Capillary: 105 mg/dL — ABNORMAL HIGH (ref 70–99)
Glucose-Capillary: 112 mg/dL — ABNORMAL HIGH (ref 70–99)
Glucose-Capillary: 117 mg/dL — ABNORMAL HIGH (ref 70–99)
Glucose-Capillary: 124 mg/dL — ABNORMAL HIGH (ref 70–99)
Glucose-Capillary: 125 mg/dL — ABNORMAL HIGH (ref 70–99)
Glucose-Capillary: 126 mg/dL — ABNORMAL HIGH (ref 70–99)

## 2023-02-02 NOTE — Progress Notes (Signed)
Upon entering the patients room, she had her NGT in her hand and it was at the 35 cm. Tube feed held. NGT adjusted to 65 cm. Provider notified. X-Ray verified placement. Tube feeding restarted.

## 2023-02-02 NOTE — Consult Note (Addendum)
WOC Nurse wound follow up Wound type:lower abd midline full thickness surgical wound Measurement: refer to previous measurements  Wound bed: pale red Drainage (amount, consistency, odor) minimal amt serosanguinous in canister Periwound: intact skin surrounding Dressing procedure/placement/frequency: Removed old NPWT dressing (1pc black) Periwound skin protected with skin barrier wipe  Filled wound with  _1__ piece of black foam Sealed NPWT dressing at HG cont suction Patient received pain medication prior to the procedure and tolerated with minimal amt discomfort.    Ok for bedside nurse to change non-complex NPWT dressing M/W/F  Thank-you,  Cammie Mcgee MSN, RN, CWOCN, [place], CNS [phone]

## 2023-02-02 NOTE — Progress Notes (Signed)
Daily Progress Note   Patient Name: Joanne [name]       Date: 02/02/2023 DOB: Apr 23, 1971  Age: 52 y.o. MRN#: [id number] Attending Physician: Lucile Shutters, MD Primary Care Physician: Crist Fat, MD Admit Date: 01/16/2023  Reason for Consultation/Follow-up: Establishing goals of care  Subjective: Notes and labs reviewed.  In to see patient.  [name] was at bedside.  Fredrik Cove is aware of current plans.  Continuing calorie count over weekend.  Please see previous PMT notes for further.  Length of Stay: 17  Current Medications: Scheduled Meds:   ascorbic acid  500 mg Per Tube BID   baclofen  10 mg Per Tube TID   carBAMazepine  50 mg Per Tube Q6H   Chlorhexidine Gluconate Cloth  6 each Topical Daily   famotidine  20 mg Per Tube BID   feeding supplement (NEPRO CARB STEADY)  237 mL Oral BID BM   feeding supplement (PROSource TF20)  60 mL Per Tube Daily   free water  135 mL Per Tube Q4H   insulin aspart  0-6 Units Subcutaneous Q4H   levothyroxine  112 mcg Per Tube Q0600   nutrition supplement (JUVEN)  1 packet Per Tube BID BM   sertraline  25 mg Per Tube Daily   sodium chloride flush  3 mL Intravenous Q12H    Continuous Infusions:  feeding supplement (OSMOLITE 1.5 CAL) 1,000 mL (02/02/23 1448)    PRN Meds: acetaminophen **OR** acetaminophen, docusate, mouth rinse  Physical Exam Pulmonary:     Effort: Pulmonary effort is normal.  Neurological:     Mental Status: She is alert.             Vital Signs: BP 97/71 (BP Location: Right Wrist)   Pulse 90   Temp 98.7 F (37.1 C) (Axillary)   Resp 20   Ht 5\' 2"  (1.575 m)   Wt 71.9 kg   LMP  (LMP Unknown)   SpO2 99%   BMI 28.99 kg/m  SpO2: SpO2: 99 % O2 Device: O2 Device: Room Air O2 Flow Rate: O2 Flow Rate (L/min): 1  L/min  Intake/output summary:  Intake/Output Summary (Last 24 hours) at 02/02/2023 1550 Last data filed at 02/02/2023 0616 Gross per 24 hour  Intake 253 ml  Output 675 ml  Net -422 ml   LBM: Last  BM Date : 02/01/23 Baseline Weight: Weight: 70 kg Most recent weight: Weight: 71.9 kg         Patient Active Problem List   Diagnosis Date Noted   Abdominal wall cellulitis 01/17/2023   Abscess 01/17/2023   Pneumonia 01/16/2023   Sepsis 01/16/2023   Leukocytosis 01/16/2023   HCAP (healthcare-associated pneumonia) 07/04/2021   Aspiration pneumonia 07/04/2021   Chronic diastolic CHF (congestive heart failure) 07/04/2021   Acute metabolic encephalopathy 07/04/2021   Hyperkalemia 07/04/2021   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   Wound, open, in back and left leg 07/04/2021   Chronic indwelling Foley catheter    Leaking PEG tube    Failure to thrive in adult 04/24/2021   Requires assistance with all daily activities 04/24/2021   G tube feedings 04/24/2021   Closed injury of head 09/06/2020   Seizure disorder 09/06/2020   Vitamin D deficiency 09/06/2020   Mild malnutrition    Protein-calorie malnutrition, severe 05/20/2020   Weakness    Full code status    Hypokalemia 05/16/2020   Severe sepsis with septic shock 05/15/2020   Sacral decubitus ulcer, stage IV    Goals of care, counseling/discussion    Palliative care by specialist    DNR (do not resuscitate) discussion    Septic shock 03/02/2020   Hypernatremia 11/15/2019   Thrombocytopenia 11/15/2019   AKI (acute kidney injury) 11/15/2019   HTN (hypertension) 11/15/2019   Non-insulin dependent type 2 diabetes mellitus 11/15/2019   Hypothyroidism 11/15/2019   Severe sepsis with acute organ dysfunction 11/15/2019   Left hemiplegia 11/15/2019   Pressure injury of skin 11/15/2019   Aspiration pneumonia due to gastric secretions    Acute kidney injury (AKI) with acute tubular necrosis (ATN)     Traumatic brain injury    Abdominal pain 08/05/2019   Vomiting 08/05/2019   Acute on chronic respiratory failure with hypoxia 06/06/2018   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   Pneumonia due to Klebsiella pneumoniae 06/06/2018   Acute on chronic combined systolic and diastolic CHF (congestive heart failure) 06/06/2018   Sepsis secondary to UTI 06/06/2018   ARDS (adult respiratory distress syndrome) 06/06/2018   Acute respiratory failure with hypoxia 06/06/2018   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  PMT will follow-up Tuesday.  Code Status:    Code Status Orders  (From admission, onward)           Start     Ordered   01/16/23 2339  Full code  Continuous       Question:  By:  Answer:  Default: patient does not have capacity for decision making, no surrogate or prior directive available   01/16/23 2339           Code Status History     Date Active Date Inactive Code Status Order ID Comments User Context   07/04/2021 1640 07/12/2021 1925 Full Code [id number]  Lorretta Harp, MD ED   04/24/2021 1303 04/30/2021 2150 Full Code [id number]  Cox, Amy Dorris Carnes, DO ED   05/15/2020 1905 06/10/2020 2329 Full Code [id number]  Pieter Partridge, MD ED  03/02/2020 1721 03/16/2020 2328 Full Code [id number]  Erin Fulling, MD ED   11/15/2019 0253 11/21/2019 0411 Full Code [id number]  Andris Baumann, MD ED   09/10/2019 1522 10/16/2019 1653 Full Code [id number]  Soto-Rivera, Burna Mortimer Inpatient       Thank you for allowing the Palliative Medicine Team to assist in the care of this  patient.  Epic chat sent to attending regarding previous conversations and previous discussions with attending.  Morton Stall, NP  Please contact Palliative Medicine Team phone at [phone] for questions and concerns.

## 2023-02-02 NOTE — Progress Notes (Signed)
Progress Note   Patient: Joanne [name]:[id number] DOB: 03-Oct-1971 DOA: 01/16/2023     17 DOS: the patient was seen and examined on 02/02/2023   Brief hospital course: Joanne [name] is a 52 y.o. female with medical history significant of baseline chronic cognitive deficit, patient is under guardianship and bedbound with some contractures.  Prior traumatic brain injury.  And seizure disorder.  Patient is a resident of a skilled nursing facility.  Patient is transferred to [facility] ER for evaluation of altered mental status and finding of fever and patient.  As well as apparent electrolyte abnormalities at the skilled nursing facility.  Patient was being treated for pneumonia at outside facility based on a chest x-ray that was done the day prior.   ER course is notable for patient being found to be "septic ", status close fluid and antibiotic administration.  At this time patient does not offer any complaint please note that the patient is very limited in terms of her verbal status.  Maybe gives one-word answers or copies what I say.    Assessment and Plan: Severe Sepsis secondary to Suprapubic Abscess and suspected CAP (present on admission) --S/p Debridement x1 and wound reexploration with VAC placement -General Surgery following, appreciate input ~ wound care and VAC changes as per Surgery recommendations ~ "Recommend continued Monday Wednesday Friday wound VAC changes until less depth noted in the wound. Wound ostomy nurse and/or floor nurse to continue changes for now. Surgery will peripherally follow. Will follow-up next week if patient still in-house. She can follow-up in the office as outpatient in 1 week if discharged prior to" -WBC down to normal 9.2 - empiric Zosyn completed rx for total 8 days.  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  Patient back to baseline mental status and is responsive. -Continue Baclofen   Post-Op Respiratory Failure in the setting of severe Sepsis & and suspected CAP -pt on [place] oxygen   H/o TBI, bed bound with contractures At risk for malnutrition  --pt had PEG tube which was removed in 08/2022 --she has had d/w Pallaitive care NP  and would like PEG (J tube) feeding.  --IR, GI and gen surgery unable to place J tube due to her ventral hernia, contracture, recent surgery. -- Patient was reassessed by speech therapy and Due to dysphagia and risk for aspiration, recommend continue w/ the Dysphagia level 1 (puree) foods moistened well w/ Nectar liquids - strict Aspiration precautions; full feeding support at meals. Reflux precautions. Supervision during all oral intake. Recommend Pills CRUSHED in Puree w/ NSG for safer swallowing, or given via NGT. Check for oral clearing after all po's.  NGT remains in place preventing a recommendation of diet upgrade to full puree diet(too dense consistency w/ NGT in place). ST services can f/u w/ pt at her NH for toleration of oral diet; education for staff   --Appreciate dietitian input, "given pt's improvement in mentation, calorie count will be done to determine if pt is eating enough to benefit from NGT removal. Ideally, would like to see pt demonstrate at least meeting 75% of estimated nutritional needs via PO prior to removal of NGT"               Subjective:  Patient is seen and examined  at the bedside.  Able to tolerate oral intake.   Physical Exam: Vitals:   02/01/23 1700 02/01/23 1945 02/02/23 0007 02/02/23 0329  BP: 118/69 107/70 116/75 113/72  Pulse: (!) 103 (!) 102 99 92  Resp: 18 18 18 18   Temp: 98.1 F (36.7 C) 98.6 F (37 C) 98.7 F (37.1 C) 98.7 F (37.1 C)  TempSrc: Oral Oral  Oral  SpO2: 99%  99% 100% 100%  Weight:      Height:       GENERAL:  52 y.o.-year-old patient with no acute distress. contractures LUNGS: Normal breath sounds bilaterally CARDIOVASCULAR: S1, S2 normal ABDOMEN: Soft, wound vac + EXTREMITIES: contractures NEUROLOGIC: nonfocal  patient is alert, soft spoken SKIN:  Pressure Injury 01/17/23 Buttocks Right Stage 3 -  Full thickness tissue loss. Subcutaneous fat may be visible but bone, tendon or muscle are NOT exposed. 01/17/23; updated by WOC full thickness; Stage 3 (Active)  01/17/23 0430  Location: Buttocks  Location Orientation: Right  Staging: Stage 3 -  Full thickness tissue loss. Subcutaneous fat may be visible but bone, tendon or muscle are NOT exposed.  Wound Description (Comments): 01/17/23; updated by WOC full thickness; Stage 3  Present on Admission: Yes     Pressure Injury 01/17/23 Buttocks Right Stage 3 -  Full thickness tissue loss. Subcutaneous fat may be visible but bone, tendon or muscle are NOT exposed. 01/17/23 updated by WOC nurse; full thickness Stage 3 (Active)  01/17/23 0430  Location: Buttocks  Location Orientation: Right  Staging: Stage 3 -  Full thickness tissue loss. Subcutaneous fat may be visible but bone, tendon or muscle are NOT exposed.  Wound Description (Comments): 01/17/23 updated by WOC nurse; full thickness Stage 3  Present on Admission: Yes     Data Reviewed:  There are no new results to review at this time.  Family Communication: No family at the bedside.  Discussed with patient's legal guardian and son on 01/31/23 about patient's current condition and plan of care.  They verbalized understanding and were in agreement.   Disposition: Status is: Inpatient Remains inpatient appropriate because: Needs calorie counts prior to removal of NG tube  Planned Discharge Destination: Skilled nursing facility    Time spent: 33 minutes  Author: Lucile Shutters, MD 02/02/2023 8:40 AM  For on call review [email or web address].

## 2023-02-02 NOTE — Progress Notes (Signed)
SLP Cancellation Note  Patient Details Name: Joanne [name] MRN: [id number] DOB: August 02, 1971   Cancelled treatment:       Reason Eval/Treat Not Completed: Patient not medically ready (chart reviewed)  Pt continues to maintain an NGT for nutrition and has a ongoing Calorie Count per Dietician. Any upgrade of diet is not recommended w/ the NGT in place. MD and Dietician consulted re: this and pt's overall presentation including waxing/waning oral intake. It does appear that pt could have challenges attempting to maintain her nutrition/hydration needs orally. Recommend further discussion w/ GI/MD re: alternative means of feeding for full nutrition/hydration support moving forward. MD agreed.  ST services will f/u on Monday for any pt needs. Recommend frequent oral care and FULL FEEDING support at meals; aspiration precautions.    Jerilynn Som, MS, CCC-SLP Speech Language Pathologist Rehab Services; [facility] [phone] (ascom) [facility] 02/02/2023, 5:38 PM

## 2023-02-02 NOTE — Progress Notes (Addendum)
Calorie Count Note  48 hour calorie count ordered.  Diet: Full liquid, nectar thick Supplements: Nepro Shake po TID, each supplement provides 425 kcal and 19 grams protein; Magic cup TID with meals, each supplement provides 290 kcal and 9 grams of protein   Breakfast:141 kcals, 9 grams protein  Lunch: 263 kcals, 12 grams protein  Dinner: nothing documented  Total intake: 404 kcal (24% of minimum estimated needs)  21 grams protein (25% of minimum estimated needs)  Pt lying in bed at time of visit. Pt a lot less interactive in comparison to yesterday. Pt did not arouse to voice or touch. Observed breakfast tray; pt consumed about 25% of Nepro and 1/3 of yogurt.   SLP does not want to advance diet past full liquids with NGT in, due to concern that pt's swallowing mechanism is not strong enough to swallow around it. Plan to advance diet to dysphagia 1 with nectar thick liquids (diet PTA) once NGT is removed.   Case discussed with MD and relayed concerns about mentation and poor oral intake. Plan to continue calorie count throughout the weekend.   Nutrition Dx: Inadequate oral intake related to inability to eat (pt sedated and ventilated) as evidenced by NPO status; ongoing  Goal: Patient will meet greater than or equal to 90% of their needs; progressing  Intervention:   -Continue feeding assistance with meals    -Continue 48 hour calorie count; will follow-up on Monday, 02/05/23   -Continue TF via NGT:    Osmolite 1.5 @ 50 ml/hr   60 ml Prosource Plus daily   135 ml free water flush every 4 hours   Tube feeding regimen provides 1880 kcal (100% of needs), 95 grams of protein, and 914 ml of H2O.  Total free water: 1724 ml daily   -1 packet Juven BID via tube, each packet provides 95 calories, 2.5 grams of protein (collagen), and 9.8 grams of carbohydrate (3 grams sugar); also contains 7 grams of L-arginine and L-glutamine, 300 mg vitamin C, 15 mg vitamin E, 1.2 mcg vitamin B-12, 9.5  mg zinc, 200 mg calcium, and 1.5 g  Calcium Beta-hydroxy-Beta-methylbutyrate to support wound healing    -Continue 500 mg vitamin C BID via tube   -Continue Nepro Shake po TID, each supplement provides 425 kcal and 19 grams protein    -Continue Magic cup TID with meals, each supplement provides 290 kcal and 9 grams of protein     Levada Schilling, RD, LDN, CDCES Registered Dietitian II Certified Diabetes Care and Education Specialist Please refer to AMION for RD and/or RD on-call/weekend/after hours pager

## 2023-02-03 DIAGNOSIS — J189 Pneumonia, unspecified organism: Secondary | ICD-10-CM | POA: Diagnosis not present

## 2023-02-03 LAB — GLUCOSE, CAPILLARY
Glucose-Capillary: 110 mg/dL — ABNORMAL HIGH (ref 70–99)
Glucose-Capillary: 113 mg/dL — ABNORMAL HIGH (ref 70–99)
Glucose-Capillary: 114 mg/dL — ABNORMAL HIGH (ref 70–99)
Glucose-Capillary: 142 mg/dL — ABNORMAL HIGH (ref 70–99)
Glucose-Capillary: 67 mg/dL — ABNORMAL LOW (ref 70–99)
Glucose-Capillary: 88 mg/dL (ref 70–99)
Glucose-Capillary: 99 mg/dL (ref 70–99)

## 2023-02-03 LAB — BASIC METABOLIC PANEL
Anion gap: 6 (ref 5–15)
BUN: 28 mg/dL — ABNORMAL HIGH (ref 6–20)
CO2: 30 mmol/L (ref 22–32)
Calcium: 8.3 mg/dL — ABNORMAL LOW (ref 8.9–10.3)
Chloride: 103 mmol/L (ref 98–111)
Creatinine, Ser: 0.51 mg/dL (ref 0.44–1.00)
GFR, Estimated: >60 mL/min (ref >60)
Glucose, Bld: 108 mg/dL — ABNORMAL HIGH (ref 70–99)
Potassium: 3.8 mmol/L (ref 3.5–5.1)
Sodium: 139 mmol/L (ref 135–145)

## 2023-02-03 LAB — CBC
HCT: 25.1 % — ABNORMAL LOW (ref 36.0–46.0)
Hemoglobin: 7.7 g/dL — ABNORMAL LOW (ref 12.0–15.0)
MCH: 27.7 pg (ref 26.0–34.0)
MCHC: 30.7 g/dL (ref 30.0–36.0)
MCV: 90.3 fL (ref 80.0–100.0)
Platelets: 365 10*3/uL (ref 150–400)
RBC: 2.78 MIL/uL — ABNORMAL LOW (ref 3.87–5.11)
RDW: 16.9 % — ABNORMAL HIGH (ref 11.5–15.5)
WBC: 10.8 10*3/uL — ABNORMAL HIGH (ref 4.0–10.5)
nRBC: 0 % (ref 0.0–0.2)

## 2023-02-03 MED ORDER — ORAL CARE MOUTH RINSE
15.0000 mL | OROMUCOSAL | Status: DC | PRN
  Administered 2023-02-03: 15 mL via OROMUCOSAL

## 2023-02-03 MED ORDER — ENOXAPARIN SODIUM 40 MG/0.4ML IJ SOSY
40.0000 mg | PREFILLED_SYRINGE | INTRAMUSCULAR | Status: DC
  Administered 2023-02-03 – 2023-02-08 (×6): 40 mg via SUBCUTANEOUS
  Filled 2023-02-03 (×6): qty 0.4

## 2023-02-03 MED ORDER — ORAL CARE MOUTH RINSE
15.0000 mL | OROMUCOSAL | Status: DC
  Administered 2023-02-03 – 2023-02-09 (×24): 15 mL via OROMUCOSAL

## 2023-02-03 NOTE — Progress Notes (Signed)
  Hypoglycemic Event  CBG: 67   Treatment: 4 oz juice/soda  Symptoms: None  Follow-up CBG: Time:1419 CBG Result:114   Possible Reasons for Event: Inadequate meal intake   Delay in recheck of blood sugar due to lunch arriving, patient requires total assist to eat so RN assisted patient to eat. Then patient had large BM that required cleaning.    Kavin Leech A
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
## 2023-02-03 NOTE — Progress Notes (Signed)
   02/03/23 1958  Vitals  Temp 98.1 F (36.7 C)  Temp Source Oral  BP 126/88  MAP (mmHg) 101  BP Location Right Leg  BP Method Automatic  Patient Position (if appropriate) Lying  Pulse Rate (!) 120  Pulse Rate Source Monitor  Resp 20  MEWS COLOR  MEWS Score Color Yellow  Oxygen Therapy  SpO2 99 %  O2 Device Room Air  Pain Assessment  Pain Scale Faces  Pain Score 4  Pain Type Chronic pain  Pain Location Generalized  Patients Stated Pain Goal 0  Pain Intervention(s) Repositioned  MEWS Score  MEWS Temp 0  MEWS Systolic 0  MEWS Pulse 2  MEWS RR 0  MEWS LOC 0  MEWS Score 2  Provider Notification  Provider Name/Title Manuela Schwartz  Date Provider Notified 02/03/23  Time Provider Notified 2059  Method of Notification Page;Face-to-face  Notification Reason Other (Comment) (mews)  Provider response At bedside;No new orders  Date of Provider Response 02/03/23  Time of Provider Response 2111   Pt HR elevated. Provider notified. Recommended ensuring patient is pain free and urinary catheter is draining properly. Pt repositioned. Bladder scan 40ml. PRN pain medication given.

## 2023-02-04 DIAGNOSIS — J189 Pneumonia, unspecified organism: Secondary | ICD-10-CM | POA: Diagnosis not present

## 2023-02-04 LAB — GLUCOSE, CAPILLARY
Glucose-Capillary: 101 mg/dL — ABNORMAL HIGH (ref 70–99)
Glucose-Capillary: 103 mg/dL — ABNORMAL HIGH (ref 70–99)
Glucose-Capillary: 105 mg/dL — ABNORMAL HIGH (ref 70–99)
Glucose-Capillary: 112 mg/dL — ABNORMAL HIGH (ref 70–99)
Glucose-Capillary: 114 mg/dL — ABNORMAL HIGH (ref 70–99)
Glucose-Capillary: 86 mg/dL (ref 70–99)
Glucose-Capillary: 97 mg/dL (ref 70–99)

## 2023-02-04 LAB — PHOSPHORUS: Phosphorus: 3.5 mg/dL (ref 2.5–4.6)

## 2023-02-04 LAB — BASIC METABOLIC PANEL
Anion gap: 5 (ref 5–15)
BUN: 32 mg/dL — ABNORMAL HIGH (ref 6–20)
CO2: 28 mmol/L (ref 22–32)
Calcium: 8.3 mg/dL — ABNORMAL LOW (ref 8.9–10.3)
Chloride: 103 mmol/L (ref 98–111)
Creatinine, Ser: 0.48 mg/dL (ref 0.44–1.00)
GFR, Estimated: >60 mL/min (ref >60)
Glucose, Bld: 109 mg/dL — ABNORMAL HIGH (ref 70–99)
Potassium: 3.5 mmol/L (ref 3.5–5.1)
Sodium: 136 mmol/L (ref 135–145)

## 2023-02-04 LAB — CBC
HCT: 26.6 % — ABNORMAL LOW (ref 36.0–46.0)
Hemoglobin: 8 g/dL — ABNORMAL LOW (ref 12.0–15.0)
MCH: 27.2 pg (ref 26.0–34.0)
MCHC: 30.1 g/dL (ref 30.0–36.0)
MCV: 90.5 fL (ref 80.0–100.0)
Platelets: 383 10*3/uL (ref 150–400)
RBC: 2.94 MIL/uL — ABNORMAL LOW (ref 3.87–5.11)
RDW: 17.1 % — ABNORMAL HIGH (ref 11.5–15.5)
WBC: 11.1 10*3/uL — ABNORMAL HIGH (ref 4.0–10.5)
nRBC: 0 % (ref 0.0–0.2)

## 2023-02-04 LAB — MAGNESIUM: Magnesium: 1.9 mg/dL (ref 1.7–2.4)

## 2023-02-04 MED ORDER — MORPHINE SULFATE (PF) 2 MG/ML IV SOLN
1.0000 mg | INTRAVENOUS | Status: DC | PRN
  Administered 2023-02-04 – 2023-02-09 (×7): 1 mg via INTRAVENOUS
  Filled 2023-02-04 (×7): qty 1

## 2023-02-04 MED ORDER — OXYCODONE HCL 5 MG PO TABS
5.0000 mg | ORAL_TABLET | Freq: Four times a day (QID) | ORAL | Status: DC | PRN
  Administered 2023-02-04 – 2023-02-09 (×7): 5 mg via ORAL
  Filled 2023-02-04 (×7): qty 1
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
## 2023-02-05 DIAGNOSIS — J189 Pneumonia, unspecified organism: Secondary | ICD-10-CM | POA: Diagnosis not present

## 2023-02-05 LAB — BASIC METABOLIC PANEL
Anion gap: 7 (ref 5–15)
BUN: 27 mg/dL — ABNORMAL HIGH (ref 6–20)
CO2: 30 mmol/L (ref 22–32)
Calcium: 8.6 mg/dL — ABNORMAL LOW (ref 8.9–10.3)
Chloride: 101 mmol/L (ref 98–111)
Creatinine, Ser: 0.5 mg/dL (ref 0.44–1.00)
GFR, Estimated: >60 mL/min (ref >60)
Glucose, Bld: 91 mg/dL (ref 70–99)
Potassium: 4.4 mmol/L (ref 3.5–5.1)
Sodium: 138 mmol/L (ref 135–145)

## 2023-02-05 LAB — GLUCOSE, CAPILLARY
Glucose-Capillary: 72 mg/dL (ref 70–99)
Glucose-Capillary: 79 mg/dL (ref 70–99)
Glucose-Capillary: 94 mg/dL (ref 70–99)
Glucose-Capillary: 118 mg/dL — ABNORMAL HIGH (ref 70–99)
Glucose-Capillary: 122 mg/dL — ABNORMAL HIGH (ref 70–99)

## 2023-02-05 LAB — HEPATIC FUNCTION PANEL
ALT: 16 U/L (ref 0–44)
AST: 26 U/L (ref 15–41)
Albumin: 2.2 g/dL — ABNORMAL LOW (ref 3.5–5.0)
Alkaline Phosphatase: 154 U/L — ABNORMAL HIGH (ref 38–126)
Bilirubin, Direct: <0.1 mg/dL (ref 0.0–0.2)
Total Bilirubin: 0.2 mg/dL — ABNORMAL LOW (ref 0.3–1.2)
Total Protein: 7.7 g/dL (ref 6.5–8.1)

## 2023-02-05 LAB — PHOSPHORUS: Phosphorus: 3.6 mg/dL (ref 2.5–4.6)

## 2023-02-05 LAB — MAGNESIUM: Magnesium: 1.9 mg/dL (ref 1.7–2.4)

## 2023-02-05 MED ORDER — JUVEN PO PACK
1.0000 | PACK | Freq: Two times a day (BID) | ORAL | Status: DC
  Administered 2023-02-05 – 2023-02-09 (×9): 1

## 2023-02-05 MED ORDER — FREE WATER
135.0000 mL | Status: DC
  Administered 2023-02-05 – 2023-02-09 (×23): 135 mL

## 2023-02-05 MED ORDER — PROSOURCE TF20 ENFIT COMPATIBL EN LIQD
60.0000 mL | Freq: Every day | ENTERAL | Status: DC
  Administered 2023-02-05 – 2023-02-09 (×5): 60 mL

## 2023-02-05 MED ORDER — OSMOLITE 1.5 CAL PO LIQD
1000.0000 mL | ORAL | Status: DC
  Administered 2023-02-05: 1000 mL

## 2023-02-05 NOTE — Consult Note (Signed)
WOC Nurse wound follow up Wound type:Lower abdomen full thickness surgical wound Measurement:2cm x 9cm x 2cm Wound bed: clean, pink, moist Drainage (amount, consistency, odor) small serosanguinous Periwound: intact Dressing procedure/placement/frequency: Patient was premedicated for pain.  Dressing (1-piece black foam) removed. Filled wound with 1 piece black foam, covered with drape and attached to continuous negative pressure. An immediate seal is achieved. Patient tolerated procedure well.  This noncomplex dressing is appropriate for Bedside RN to change.  Next dressing change is due on [facility] 02/07/23.  WOC nursing team will follow, and will remain available to this patient, the nursing and medical teams.    Thank you for inviting [place] to participate in this patient's Plan of Care.  Ladona Mow, MSN, RN, CNS, GNP, Leda Min, [employer], [employer] phone:  [phone]
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
## 2023-02-05 NOTE — Progress Notes (Signed)
SLP Cancellation Note  Patient Details Name: SATHVIKA TAKAYAMA MRN: [id number] DOB: [birth year]   Cancelled treatment:       Reason Eval/Treat Not Completed: Patient not medically ready;Medical issues which prohibited therapy (reviewed chart; consulted MD re: POC, options)  Pt remains w/ NGT in place. Per MD note: "4/15: Discussed with dietitian, calorie count illustrated the patient is meeting less than 25% of estimated nutritional needs p.o.  Ideally patient should meet at least 75% of estimated nutritional needs via p.o. prior to removal of NG tube.  Dietitian recommends to resume tube feeds" via NGT. Pt requires feeding assistance and will require a dedicated person to help assist with meeting needs.   While the NGT remains in place, ST services does NOT recommend a diet upgrade to a more solid food consistency, nor thin liquids in her current acuity of illness/State. When/if the NGT can be removed, advancement to a Dysphagia level 1 w/ nectar thick liquids can be done -- this was a recommended diet PTA.  Discussed pt's POC and options including seeking PEG placement at an outside facility since pt is nowhere near meeting her needs orally; Palliative care goals; Dietician input/goals. MD to call pt's POA to discuss further.   ST services will sign off at this time. While the NGT remains in place, it prevent a recommendation of diet upgrade to a full Puree foods diet as such puree foods can be too dense a consistency to swallow w/ NGT in place; especially in setting of pt's Baseline medical/cognitive status' and Baseline oropharyngeal phase Dysphagia(see chart notes, history).  MD to reconsult ST services for any new swallowing needs and/or education while admitted. MD agreed. Dietician updated.      Jerilynn Som, MS, CCC-SLP Speech Language Pathologist Rehab Services; [facility] [phone] (ascom) Morty Ortwein 02/05/2023, 4:33 PM
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
## 2023-02-06 ENCOUNTER — Inpatient Hospital Stay: Payer: Medicare Other

## 2023-02-06 DIAGNOSIS — J189 Pneumonia, unspecified organism: Secondary | ICD-10-CM | POA: Diagnosis not present

## 2023-02-06 DIAGNOSIS — Z7189 Other specified counseling: Secondary | ICD-10-CM | POA: Diagnosis not present

## 2023-02-06 LAB — GLUCOSE, CAPILLARY
Glucose-Capillary: 110 mg/dL — ABNORMAL HIGH (ref 70–99)
Glucose-Capillary: 81 mg/dL (ref 70–99)
Glucose-Capillary: 81 mg/dL (ref 70–99)
Glucose-Capillary: 84 mg/dL (ref 70–99)
Glucose-Capillary: 84 mg/dL (ref 70–99)
Glucose-Capillary: 85 mg/dL (ref 70–99)
Glucose-Capillary: 99 mg/dL (ref 70–99)

## 2023-02-06 NOTE — TOC Progression Note (Signed)
Transition of Care [facility]) - Progression Note    Patient Details  Name: Joanne [name] MRN: [id number] Date of Birth: July 24, 1971  Transition of [facility]) CM/SW Contact  Truddie Hidden, RN Phone Number: 02/06/2023, 3:28 PM  Clinical Narrative:    Spoke with Lajean Manes [facility] from [facility].  She confirmed patient is from [facility].  Kenney Houseman was provided update and advised TPN may be required.    Expected Discharge Plan:  (TBD) Barriers to Discharge: Continued Medical Work up  Expected Discharge Plan and Services                                               Social Determinants of Health (SDOH) Interventions SDOH Screenings   Tobacco Use: Low Risk  (01/31/2023)    Readmission Risk Interventions     No data to display

## 2023-02-06 NOTE — Progress Notes (Signed)
Daily Progress Note   Patient Name: Joanne [name]       Date: 02/06/2023 DOB: 1971/04/24  Age: 52 y.o. MRN#: [id number] Attending Physician: Lucile Shutters, MD Primary Care Physician: Crist Fat, MD Admit Date: 01/16/2023  Reason for Consultation/Follow-up: Establishing goals of care  Subjective: Notes and labs reviewed. Epic chat in progress between attending, RD and nursing; PMT added to chat, which is regarding her nutrition. Patient failed calorie count. Discussed previous conversations with patient, legal guardian, and family, and care teams.  Discussed previous determinations by IR/surgery/GI regarding feeding tube placement.  Spoke with RD in detail, on phone and in person.   In to see patient.  Patient indicates that the NGT hurts.  Legal guardian has been clear previously that she wants patient to make all decisions if at all possible, and if she is unable, they will continue full code/full scope treatment as this is what she has previously requested.  Discussed her current status with her, oral intake, and the situation with PEG tube/G-tube.  She states she wants a tube placed in her stomach.  Discussed conversation regarding inability to place a feeding tube by intervention teams earlier this admission.  Patient remains clear she wants to continue full scope treatment.    Length of Stay: 21  Current Medications: Scheduled Meds:   ascorbic acid  500 mg Per Tube BID   baclofen  10 mg Per Tube TID   carBAMazepine  50 mg Per Tube Q6H   Chlorhexidine Gluconate Cloth  6 each Topical Daily   enoxaparin (LOVENOX) injection  40 mg Subcutaneous Q24H   famotidine  20 mg Per Tube BID   feeding supplement (NEPRO CARB STEADY)  237 mL Oral BID BM   feeding supplement (PROSource TF20)  60  mL Per Tube Daily   free water  135 mL Per Tube Q4H   insulin aspart  0-6 Units Subcutaneous Q4H   levothyroxine  112 mcg Per Tube Q0600   nutrition supplement (JUVEN)  1 packet Per Tube BID BM   mouth rinse  15 mL Mouth Rinse 4 times per day   sertraline  25 mg Per Tube Daily   sodium chloride flush  3 mL Intravenous Q12H    Continuous Infusions:  feeding supplement (OSMOLITE 1.5 CAL) 50 mL/hr at 02/06/23 0700  PRN Meds: acetaminophen **OR** acetaminophen, docusate, morphine injection, mouth rinse, oxyCODONE  Physical Exam Pulmonary:     Effort: Pulmonary effort is normal.  Neurological:     Mental Status: She is alert.             Vital Signs: BP 113/71 (BP Location: Left Leg)   Pulse 78   Temp 97.9 F (36.6 C) (Oral)   Resp 12   Ht 5\' 2"  (1.575 m)   Wt 68.1 kg   LMP  (LMP Unknown)   SpO2 100%   BMI 27.46 kg/m  SpO2: SpO2: 100 % O2 Device: O2 Device: Room Air O2 Flow Rate: O2 Flow Rate (L/min): 0 L/min  Intake/output summary:  Intake/Output Summary (Last 24 hours) at 02/06/2023 1114 Last data filed at 02/06/2023 0836 Gross per 24 hour  Intake 1438.33 ml  Output 1050 ml  Net 388.33 ml   LBM: Last BM Date : 02/06/23 Baseline Weight: Weight: 70 kg Most recent weight: Weight: 68.1 kg      Patient Active Problem List   Diagnosis Date Noted   Abdominal wall cellulitis 01/17/2023   Abscess 01/17/2023   Pneumonia 01/16/2023   Sepsis 01/16/2023   Leukocytosis 01/16/2023   HCAP (healthcare-associated pneumonia) 07/04/2021   Aspiration pneumonia 07/04/2021   Chronic diastolic CHF (congestive heart failure) 07/04/2021   Acute metabolic encephalopathy 07/04/2021   Hyperkalemia 07/04/2021   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   Wound, open, in back and left leg 07/04/2021   Chronic indwelling Foley catheter    Leaking PEG tube    Failure to thrive in adult 04/24/2021   Requires assistance with all daily activities 04/24/2021   G  tube feedings 04/24/2021   Closed injury of head 09/06/2020   Seizure disorder 09/06/2020   Vitamin D deficiency 09/06/2020   Mild malnutrition    Protein-calorie malnutrition, severe 05/20/2020   Weakness    Full code status    Hypokalemia 05/16/2020   Severe sepsis with septic shock 05/15/2020   Sacral decubitus ulcer, stage IV    Goals of care, counseling/discussion    Palliative care by specialist    DNR (do not resuscitate) discussion    Septic shock 03/02/2020   Hypernatremia 11/15/2019   Thrombocytopenia 11/15/2019   AKI (acute kidney injury) 11/15/2019   HTN (hypertension) 11/15/2019   Non-insulin dependent type 2 diabetes mellitus 11/15/2019   Hypothyroidism 11/15/2019   Severe sepsis with acute organ dysfunction 11/15/2019   Left hemiplegia 11/15/2019   Pressure injury of skin 11/15/2019   Aspiration pneumonia due to gastric secretions    Acute kidney injury (AKI) with acute tubular necrosis (ATN)    Traumatic brain injury    Abdominal pain 08/05/2019   Vomiting 08/05/2019   Acute on chronic respiratory failure with hypoxia 06/06/2018   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   Pneumonia due to Klebsiella pneumoniae 06/06/2018   Acute on chronic combined systolic and diastolic CHF (congestive heart failure) 06/06/2018   Sepsis secondary to UTI 06/06/2018   ARDS (adult respiratory distress syndrome) 06/06/2018   Acute respiratory failure with hypoxia 06/06/2018   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   Iron deficiency anemia 05/20/2018   Gross hematuria 01/18/2018   Urinary retention 01/18/2018   Acute respiratory distress 10/23/2012   Cardiorespiratory arrest 10/23/2012   Acute blood loss anemia 10/20/2012   Hypophosphatemia 10/20/2012   Leukopenia 10/20/2012   Altered mental status 10/19/2012   Duodenal obstruction 10/19/2012    Palliative Care Assessment & Plan    Recommendations/Plan:  Patient continues to request full code/full scope treatment.  Legal guardian defers  decision making to patient if at all possible.  If patient is unable to make decisions, legal guardian would default to previous conversations where Joanne [name] has requested full code/full scope treatment.  Code Status:    Code Status Orders  (From admission, onward)           Start     Ordered   01/16/23 2339  Full code  Continuous       Question:  By:  Answer:  Default: patient does not have capacity for decision making, no surrogate or prior directive available   01/16/23 2339           Code Status History     Date Active Date Inactive Code Status Order ID Comments User Context   07/04/2021 1640 07/12/2021 1925 Full Code [id number]  Lorretta Harp, MD ED   04/24/2021 1303 04/30/2021 2150 Full Code [id number]  Cox, Amy N, DO ED   05/15/2020 1905 06/10/2020 2329 Full Code [id number]  Pieter Partridge, MD ED   03/02/2020 1721 03/16/2020 2328 Full Code [id number]  Erin Fulling, MD ED   11/15/2019 0253 11/21/2019 0411 Full Code [id number]  Andris Baumann, MD ED   09/10/2019 1522 10/16/2019 1653 Full Code [id number]  [place], [facility] Inpatient         Morton Stall, NP  Please contact Palliative Medicine Team phone at [phone] for questions and concerns.

## 2023-02-06 NOTE — Progress Notes (Signed)
Attempted to feed pt lunch.  She reported she was praying.  RN agreed to return later to help eat, and pt agreed.  RN concerned the pt was delaying nutrition as lunch was already very late.
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
## 2023-02-06 NOTE — Progress Notes (Signed)
Called into patients room by NT. Patients NG tube was halfway out but I was able to advance it back to its original spot of 65cm. Patient is complaining of pain when she swallows. Tube feeding turned off to until RN can speak to MD about whether they need to reverify placement prior to resuming feedings.
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
## 2023-02-07 DIAGNOSIS — D72829 Elevated white blood cell count, unspecified: Secondary | ICD-10-CM | POA: Diagnosis not present

## 2023-02-07 DIAGNOSIS — R1010 Upper abdominal pain, unspecified: Secondary | ICD-10-CM | POA: Diagnosis not present

## 2023-02-07 DIAGNOSIS — E875 Hyperkalemia: Secondary | ICD-10-CM

## 2023-02-07 DIAGNOSIS — E870 Hyperosmolality and hypernatremia: Secondary | ICD-10-CM

## 2023-02-07 DIAGNOSIS — A419 Sepsis, unspecified organism: Secondary | ICD-10-CM | POA: Diagnosis not present

## 2023-02-07 DIAGNOSIS — J189 Pneumonia, unspecified organism: Secondary | ICD-10-CM | POA: Diagnosis not present

## 2023-02-07 LAB — GLUCOSE, CAPILLARY
Glucose-Capillary: 106 mg/dL — ABNORMAL HIGH (ref 70–99)
Glucose-Capillary: 132 mg/dL — ABNORMAL HIGH (ref 70–99)
Glucose-Capillary: 158 mg/dL — ABNORMAL HIGH (ref 70–99)
Glucose-Capillary: 78 mg/dL (ref 70–99)
Glucose-Capillary: 83 mg/dL (ref 70–99)
Glucose-Capillary: 93 mg/dL (ref 70–99)

## 2023-02-07 NOTE — Consult Note (Signed)
WOC Nurse wound follow up Wound type:surgical Measurement:2cm x 6cm x 2cm Wound bed: 95% beefy red, moist. Small area of white tissue in wound base (5%) Drainage (amount, consistency, odor) small to moderate serosanguinous Periwound: intact Dressing procedure/placement/frequency: old NPWT dressing removed with medical adhesive remover spray, 1 piece of black foam removed from defect. Wound cleansed and skin patted dry. One (1) piece of black foam used to fill dead space, this is covered with drape and a T.R.A.C.C. pad is applied. The dressing is attached to 125mmHg continuous negative ssure and an immediate seal is achieved. Patient was premedicated and tolerated procedure well. Next dressing change is due on FRI, 02/08/21. Supplies (2 NPWT dressing kits and 1 cannister) have been requested of [employer].  WOC nursing team will follow, and will remain available to this patient, the nursing and medical teams. .  Thank you for inviting [place] to participate in this patient's Plan of Care.  Ladona Mow, MSN, RN, CNS, GNP, Leda Min, [employer], [employer] phone:  [phone]
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
## 2023-02-07 NOTE — Progress Notes (Signed)
Calorie Count Note  48 hour calorie count ordered.  Diet: full liquid with nectar thick liquids Supplements: Nepro Shake po TID, each supplement provides 425 kcal and 19 grams protein; Magic cup TID with meals, each supplement provides 290 kcal and 9 grams of protein   Pt remains with NGT. TF currently being held to see if this will improve oral intake. Pt not very interactive at time of visit. Pt underwent wound vac change this AM.   4/16 Breakfast: 0 kcals, 0 grams protein Lunch: 145 kcals, 5 grams protein Dinner: 0 kcals, 0 grams protein  Total intake: 145 kcal (9% of minimum estimated needs)  5 protein (6% of minimum estimated needs)  4/17 Breakfast: 0 kcals, 0 grams protein Lunch: 113 kcals, 5 grams protein Dinner: nothing documented yet  Total intake: 113 kcal (7% of minimum estimated needs)  5 grams protein (6% of minimum estimated needs)  Nutrition Dx: Inadequate oral intake related to inability to eat (pt sedated and ventilated) as evidenced by NPO status; ongoing  Goal: Patient will meet greater than or equal to 90% of their needs; unmet  Intervention:   -Hold TF per MD -48 hour calorie count -Continue Nepro Shake po TID, each supplement provides 425 kcal and 19 grams protein  -Continue Magic cup TID with meals, each supplement provides 290 kcal and 9 grams of protein   Levada Schilling, RD, LDN, CDCES Registered Dietitian II Certified Diabetes Care and Education Specialist Please refer to [facility] for RD and/or RD on-call/weekend/after hours pager
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
## 2023-02-07 NOTE — Plan of Care (Signed)
PMT shadowing at this time. TF held and calorie count in progress. Spoke with attending for updates. Will continue to follow.

## 2023-02-08 DIAGNOSIS — A419 Sepsis, unspecified organism: Secondary | ICD-10-CM | POA: Diagnosis not present

## 2023-02-08 DIAGNOSIS — R1010 Upper abdominal pain, unspecified: Secondary | ICD-10-CM | POA: Diagnosis not present

## 2023-02-08 DIAGNOSIS — J189 Pneumonia, unspecified organism: Secondary | ICD-10-CM | POA: Diagnosis not present

## 2023-02-08 DIAGNOSIS — D72829 Elevated white blood cell count, unspecified: Secondary | ICD-10-CM | POA: Diagnosis not present

## 2023-02-08 LAB — GLUCOSE, CAPILLARY
Glucose-Capillary: 100 mg/dL — ABNORMAL HIGH (ref 70–99)
Glucose-Capillary: 132 mg/dL — ABNORMAL HIGH (ref 70–99)
Glucose-Capillary: 175 mg/dL — ABNORMAL HIGH (ref 70–99)
Glucose-Capillary: 86 mg/dL (ref 70–99)
Glucose-Capillary: 93 mg/dL (ref 70–99)
Glucose-Capillary: 97 mg/dL (ref 70–99)

## 2023-02-08 NOTE — Progress Notes (Signed)
Patient found to have removed Urinary catheter this morning.   MD made aware

## 2023-02-08 NOTE — TOC Progression Note (Signed)
Transition of Care [facility]) - Progression Note    Patient Details  Name: Joanne [name] MRN: [id number] Date of Birth: 1970/11/01  [facility]) CM/SW Contact  Truddie Hidden, RN Phone Number: 02/08/2023, 3:33 PM  Clinical Narrative:    Message sent to Kenney Houseman at [facility] to advise of patient's return tomorrow.    Expected Discharge Plan:  (TBD) Barriers to Discharge: Continued Medical Work up  Expected Discharge Plan and Services                                               Social Determinants of Health (SDOH) Interventions SDOH Screenings   Tobacco Use: Low Risk  (01/31/2023)    Readmission Risk Interventions     No data to display
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
## 2023-02-09 DIAGNOSIS — A419 Sepsis, unspecified organism: Secondary | ICD-10-CM | POA: Diagnosis not present

## 2023-02-09 DIAGNOSIS — D72829 Elevated white blood cell count, unspecified: Secondary | ICD-10-CM | POA: Diagnosis not present

## 2023-02-09 DIAGNOSIS — J189 Pneumonia, unspecified organism: Secondary | ICD-10-CM | POA: Diagnosis not present

## 2023-02-09 DIAGNOSIS — R1010 Upper abdominal pain, unspecified: Secondary | ICD-10-CM | POA: Diagnosis not present

## 2023-02-09 LAB — GLUCOSE, CAPILLARY
Glucose-Capillary: 107 mg/dL — ABNORMAL HIGH (ref 70–99)
Glucose-Capillary: 83 mg/dL (ref 70–99)
Glucose-Capillary: 96 mg/dL (ref 70–99)

## 2023-02-09 MED ORDER — SENNA 8.6 MG PO TABS: 2.0000 | ORAL_TABLET | Freq: Every evening | ORAL | 0 refills | Status: AC | PRN

## 2023-02-09 MED ORDER — ORAL CARE MOUTH RINSE: 15.0000 mL | OROMUCOSAL | 0 refills | Status: AC | PRN

## 2023-02-09 MED ORDER — BISACODYL 10 MG/30ML RE ENEM: 10.0000 mg | ENEMA | Freq: Every day | RECTAL | 0 refills | Status: AC | PRN

## 2023-02-09 MED ORDER — DOCUSATE SODIUM 50 MG/5ML PO LIQD: 100.0000 mg | Freq: Two times a day (BID) | ORAL | Status: AC

## 2023-02-09 MED ORDER — OXYCODONE-ACETAMINOPHEN 5-325 MG PO TABS: 1.0000 | ORAL_TABLET | Freq: Three times a day (TID) | ORAL | 0 refills | Status: AC | PRN

## 2023-02-09 NOTE — Consult Note (Signed)
WOC Nurse wound follow up Wound type:surgical Measurement:1.2cm x 5.8cm x 1cm  Wound WUJ:WJXBJ red (95%), 5% white tissue in wound bed Drainage (amount, consistency, odor) small serosanguinous Periwound:intact Dressing procedure/placement/frequency: Old NPWT dressing removed using adhesive remover spray, 1 piece of black foam removed. Wound cleansed with NS, and measured. One piece of black foam inserted into defect, covered with drape. Dressing attached to continuous negative pressure and an immediate seal is achieved. Patient was premedicated and tolerated procedure well.  Next dressing change is due on Monday, 4/22.    Surgical team may wish to consider changing from NPWT to NS wet to dry performed twice daily as wound has diminished in size.If you agree, please order.  WOC nursing team will follow, and will remain available to this patient, the nursing and medical teams.    Thank you for inviting [place] to participate in this patient's Plan of Care.  Ladona Mow, MSN, RN, CNS, GNP, Leda Min, [employer], [employer] phone:  [phone]

## 2023-02-09 NOTE — Progress Notes (Signed)
[facility] Liaison Note  Notified by Johny Shock of patient/family request of [facility] services.  [facility] hospital liaison will follow patient for discharge disposition.   Please call with any questions/concerns.    Thank you for the opportunity to participate in this patient's care.  [facility] Liaison [phone]

## 2023-02-09 NOTE — Plan of Care (Signed)
PMT has been shadowing. Plans in place for patient to discharge back to facility today. Would recommend outpatient palliative to follow. PMT will sign off at this time.

## 2023-02-09 NOTE — TOC Transition Note (Addendum)
Transition of Care [facility]) - CM/SW Discharge Note   Patient Details  Name: Joanne [name] MRN: [id number] Date of Birth: 09/06/1971  Transition of Care [facility]) CM/SW Contact:  Truddie Hidden, RN Phone Number: 02/09/2023, 9:56 AM   Clinical Narrative:    Spoke with Jenel Lucks at [facility]. Patient can return today.  Patient assigned room #  Nurse will call report to [phone] Nurse, and family notified spoke with  Face sheet and medical necessity forms printed to the floor to be added to the EMS pack EMS arranged  Discharge summary and SNF tranfer report sent in HUB.  TOC signing off.       Barriers to Discharge: Continued Medical Work up   Patient Goals and CMS Choice      Discharge Placement                         Discharge Plan and Services Additional resources added to the After Visit Summary for                                       Social Determinants of Health (SDOH) Interventions SDOH Screenings   Tobacco Use: Low Risk  (01/31/2023)     Readmission Risk Interventions     No data to display

## 2023-02-09 NOTE — NC FL2 (Signed)
[place] MEDICAID FL2 LEVEL OF CARE FORM     IDENTIFICATION  Patient Name: Joanne [name] Birthdate: 09/28/1971 Sex: female Admission Date (Current Location): 01/16/2023  [facility] and [place] Number:  Chiropodist and Address:  [facility], [address], [place], [place] [address]      Provider Number: [id number]  Attending Physician Name and Address:  Sunnie Nielsen, DO  Relative Name and Phone Number:  [name] (Legal Guardian) [phone] (Mobile    Current Level of Care: Hospital Recommended Level of Care: Skilled Nursing Facility Prior Approval Number:    Date Approved/Denied:   PASRR Number: [id number]  Discharge Plan: SNF    Current Diagnoses: Patient Active Problem List   Diagnosis Date Noted   Abdominal wall cellulitis 01/17/2023   Abscess 01/17/2023   Pneumonia 01/16/2023   Sepsis 01/16/2023   Leukocytosis 01/16/2023   HCAP (healthcare-associated pneumonia) 07/04/2021   Aspiration pneumonia 07/04/2021   Chronic diastolic CHF (congestive heart failure) 07/04/2021   Acute metabolic encephalopathy 07/04/2021   Hyperkalemia 07/04/2021   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   Wound, open, in back and left leg 07/04/2021   Chronic indwelling Foley catheter    Leaking PEG tube    Failure to thrive in adult 04/24/2021   Requires assistance with all daily activities 04/24/2021   G tube feedings 04/24/2021   Closed injury of head 09/06/2020   Seizure disorder 09/06/2020   Vitamin D deficiency 09/06/2020   Mild malnutrition    Protein-calorie malnutrition, severe 05/20/2020   Weakness    Full code status    Hypokalemia 05/16/2020   Severe sepsis with septic shock 05/15/2020   Sacral decubitus ulcer, stage IV    Goals of care, counseling/discussion    Palliative care by specialist    DNR (do not resuscitate) discussion    Septic shock 03/02/2020   Hypernatremia 11/15/2019    Thrombocytopenia 11/15/2019   AKI (acute kidney injury) 11/15/2019   HTN (hypertension) 11/15/2019   Non-insulin dependent type 2 diabetes mellitus 11/15/2019   Hypothyroidism 11/15/2019   Severe sepsis with acute organ dysfunction 11/15/2019   Left hemiplegia 11/15/2019   Pressure injury of skin 11/15/2019   Aspiration pneumonia due to gastric secretions    Acute kidney injury (AKI) with acute tubular necrosis (ATN)    Traumatic brain injury    Abdominal pain 08/05/2019   Vomiting 08/05/2019   Acute on chronic respiratory failure with hypoxia 06/06/2018   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   Pneumonia due to Klebsiella pneumoniae 06/06/2018   Acute on chronic combined systolic and diastolic CHF (congestive heart failure) 06/06/2018   Sepsis secondary to UTI 06/06/2018   ARDS (adult respiratory distress syndrome) 06/06/2018   Acute respiratory failure with hypoxia 06/06/2018   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   Iron deficiency anemia 05/20/2018   Gross hematuria 01/18/2018   Urinary retention 01/18/2018   Acute respiratory distress 10/23/2012   Cardiorespiratory arrest 10/23/2012   Acute blood loss anemia 10/20/2012   Hypophosphatemia 10/20/2012   Leukopenia 10/20/2012   Altered mental status 10/19/2012   Duodenal obstruction 10/19/2012    Orientation RESPIRATION BLADDER Height & Weight     Self, Time, Situation, Place  Normal   Weight: 70 kg Height:   (157.5 cm)  BEHAVIORAL SYMPTOMS/MOOD NEUROLOGICAL BOWEL NUTRITION STATUS  Other (Comment) (n/a)  (n/a)   Diet (Full Liquid)  AMBULATORY STATUS COMMUNICATION OF NEEDS Skin   Limited Assist Verbally (Slurred, Dysarthia) PU Stage and Appropriate Care, Other (Comment) (  eccyhmosis bil buttocks, Erythema buttock, back, groin Abrasion foot Pressure Injury 01/17/23 Buttocks Right Stage 3 - Full thickness tissue loss.  dressing change every 3 days)                       Personal Care Assistance Level of Assistance  Bathing,  Feeding, Dressing Bathing Assistance: Limited assistance Feeding assistance: Limited assistance Dressing Assistance: Limited assistance     Functional Limitations Info             SPECIAL CARE FACTORS FREQUENCY                       Contractures Contractures Info: Present (Left wrist and neck)    Additional Factors Info  Code Status, Allergies Code Status Info: FULL Allergies Info: Kaopectate  (Attapulgite), Thiopental, Gatifloxacin, Ceftin (Cefuroxime), Tomato           Current Medications (02/09/2023):  This is the current hospital active medication list Current Facility-Administered Medications  Medication Dose Route Frequency Provider Last Rate Last Admin   acetaminophen (TYLENOL) tablet 650 mg  650 mg Per Tube Q6H PRN Tonna Boehringer, Isami, DO   650 mg at 02/09/23 0851   Or   acetaminophen (TYLENOL) suppository 650 mg  650 mg Rectal Q6H PRN Tonna Boehringer, Isami, DO       ascorbic acid (VITAMIN C) tablet 500 mg  500 mg Per Tube BID Raechel Chute, MD   500 mg at 02/09/23 0852   baclofen (LIORESAL) tablet 10 mg  10 mg Per Tube TID Tonna Boehringer, Isami, DO   10 mg at 02/09/23 0851   carBAMazepine (TEGRETOL) 100 MG/5ML suspension 50 mg  50 mg Per Tube Q6H Hallaji, Sheema M, RPH   50 mg at 02/09/23 0527   Chlorhexidine Gluconate Cloth 2 % PADS 6 each  6 each Topical Daily Sakai, Isami, DO   6 each at 02/09/23 1045   docusate (COLACE) 50 MG/5ML liquid 100 mg  100 mg Per Tube BID PRN Ezequiel Essex, NP       enoxaparin (LOVENOX) injection 40 mg  40 mg Subcutaneous Q24H Cordella Register A, RPH   40 mg at 02/08/23 2122   famotidine (PEPCID) tablet 20 mg  20 mg Per Tube BID Sakai, Isami, DO   20 mg at 02/09/23 0851   feeding supplement (NEPRO CARB STEADY) liquid 237 mL  237 mL Oral BID BM Agbata, Tochukwu, MD   237 mL at 02/09/23 0850   feeding supplement (OSMOLITE 1.5 CAL) liquid 1,000 mL  1,000 mL Per Tube Continuous Agbata, Tochukwu, MD   Stopped at 02/06/23 1900   feeding supplement (PROSource  TF20) liquid 60 mL  60 mL Per Tube Daily Agbata, Tochukwu, MD   60 mL at 02/09/23 0851   free water 135 mL  135 mL Per Tube Q4H Agbata, Tochukwu, MD   135 mL at 02/09/23 0853   insulin aspart (novoLOG) injection 0-6 Units  0-6 Units Subcutaneous Q4H Jaynie Bream, RPH   1 Units at 02/08/23 1823   levothyroxine (SYNTHROID) tablet 112 mcg  112 mcg Per Tube Q0600 Sakai, Isami, DO   112 mcg at 02/09/23 0526   morphine (PF) 2 MG/ML injection 1 mg  1 mg Intravenous Q4H PRN Baldwin Jamaica, MD   1 mg at 02/09/23 1610   nutrition supplement (JUVEN) (JUVEN) powder packet 1 packet  1 packet Per Tube BID BM Agbata, Tochukwu, MD   1 packet at 02/09/23 [phone]  Oral care mouth rinse  15 mL Mouth Rinse 4 times per day Baldwin Jamaica, MD   15 mL at 02/09/23 0800   Oral care mouth rinse  15 mL Mouth Rinse PRN Baldwin Jamaica, MD   15 mL at 02/03/23 0846   oxyCODONE (Oxy IR/ROXICODONE) immediate release tablet 5 mg  5 mg Oral Q6H PRN Baldwin Jamaica, MD   5 mg at 02/09/23 0851   sertraline (ZOLOFT) tablet 25 mg  25 mg Per Tube Daily Sakai, Isami, DO   25 mg at 02/09/23 1610   sodium chloride flush (NS) 0.9 % injection 3 mL  3 mL Intravenous Q12H Sakai, Isami, DO   3 mL at 02/09/23 1045     Discharge Medications: Please see discharge summary for a list of discharge medications.  Relevant Imaging Results:  Relevant Lab Results:   Additional Information SS#[id number]  Truddie Hidden, RN

## 2023-02-09 NOTE — Care Management Important Message (Signed)
Important Message  Patient Details  Name: Joanne [name] MRN: [id number] Date of Birth: 1971/10/14   Medicare Important Message Given:  Yes  Reviewed Medicare IM with [name], legal guardian, at [phone].  Copy of Medicare IM guardian's attention at: [address] [place], [place] [address]   [name] 02/09/2023, 1:04 PM
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
## 2023-02-13 ENCOUNTER — Non-Acute Institutional Stay: Payer: BC Managed Care – PPO | Admitting: Nurse Practitioner

## 2023-02-13 ENCOUNTER — Encounter: Payer: Self-pay | Admitting: Nurse Practitioner

## 2023-02-13 VITALS — BP 115/80 | HR 88 | Temp 98.1°F | Resp 18 | Wt 146.5 lb

## 2023-02-13 DIAGNOSIS — I5032 Chronic diastolic (congestive) heart failure: Secondary | ICD-10-CM

## 2023-02-13 DIAGNOSIS — Z515 Encounter for palliative care: Secondary | ICD-10-CM

## 2023-02-13 DIAGNOSIS — R0602 Shortness of breath: Secondary | ICD-10-CM

## 2023-02-13 DIAGNOSIS — R5381 Other malaise: Secondary | ICD-10-CM
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
## 2023-02-15 ENCOUNTER — Non-Acute Institutional Stay: Payer: Medicare Other | Admitting: Nurse Practitioner

## 2023-02-15 ENCOUNTER — Encounter: Payer: Self-pay | Admitting: Nurse Practitioner

## 2023-02-15 VITALS — BP 115/80 | HR 88 | Temp 98.1°F | Resp 18 | Wt 151.0 lb

## 2023-02-15 DIAGNOSIS — I5032 Chronic diastolic (congestive) heart failure: Secondary | ICD-10-CM

## 2023-02-15 DIAGNOSIS — R5381 Other malaise: Secondary | ICD-10-CM

## 2023-02-15 DIAGNOSIS — R627 Adult failure to thrive: Secondary | ICD-10-CM

## 2023-02-15 DIAGNOSIS — R0602 Shortness of breath: Secondary | ICD-10-CM

## 2023-02-15 DIAGNOSIS — Z515 Encounter for palliative care: Secondary | ICD-10-CM
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
## 2023-04-04 ENCOUNTER — Encounter: Payer: Self-pay | Admitting: Nurse Practitioner

## 2023-04-04 ENCOUNTER — Non-Acute Institutional Stay: Payer: BC Managed Care – PPO | Admitting: Nurse Practitioner

## 2023-04-04 DIAGNOSIS — R627 Adult failure to thrive: Secondary | ICD-10-CM

## 2023-04-04 DIAGNOSIS — Z515 Encounter for palliative care: Secondary | ICD-10-CM

## 2023-04-04 DIAGNOSIS — R0602 Shortness of breath: Secondary | ICD-10-CM

## 2023-04-04 DIAGNOSIS — R5381 Other malaise: Secondary | ICD-10-CM

## 2023-04-04 DIAGNOSIS — I5032 Chronic diastolic (congestive) heart failure: Secondary | ICD-10-CM
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
## 2024-03-03 ENCOUNTER — Ambulatory Visit (INDEPENDENT_AMBULATORY_CARE_PROVIDER_SITE_OTHER): Admitting: Urology

## 2024-03-03 VITALS — BP 120/80 | HR 74 | Ht 65.0 in | Wt 150.0 lb

## 2024-03-03 DIAGNOSIS — Z87440 Personal history of urinary (tract) infections: Secondary | ICD-10-CM

## 2024-03-03 DIAGNOSIS — Z978 Presence of other specified devices: Secondary | ICD-10-CM
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
## 2024-03-05 ENCOUNTER — Encounter: Payer: Self-pay | Admitting: Urology

## 2024-03-05 ENCOUNTER — Other Ambulatory Visit: Payer: Self-pay

## 2024-03-05 DIAGNOSIS — Z87440 Personal history of urinary (tract) infections: Secondary | ICD-10-CM

## 2024-03-05 DIAGNOSIS — Z978 Presence of other specified devices: Secondary | ICD-10-CM

## 2024-03-05 NOTE — Progress Notes (Signed)
 Will place an order for SPT consult with IR .

## 2024-03-20 ENCOUNTER — Ambulatory Visit
Admission: RE | Admit: 2024-03-20 | Discharge: 2024-03-20 | Disposition: A | Discharge status: Home / Self Care | Source: Ambulatory Visit | Attending: Urology | Admitting: Urology

## 2024-03-20 ENCOUNTER — Encounter: Payer: Self-pay | Admitting: Interventional Radiology

## 2024-03-20 ENCOUNTER — Other Ambulatory Visit: Payer: Self-pay | Admitting: Interventional Radiology

## 2024-03-20 DIAGNOSIS — Z978 Presence of other specified devices: Secondary | ICD-10-CM

## 2024-03-20 DIAGNOSIS — Z87440 Personal history of urinary (tract) infections: Secondary | ICD-10-CM

## 2024-03-20 HISTORY — PX: IR RADIOLOGIST EVAL & MGMT: IMG5224

## 2024-03-20 NOTE — Consult Note (Signed)
 Chief Complaint: Patient was seen in consultation today for chronic urinary retention with indwelling Foley catheter at the request of Stoioff,Scott C  Referring Physician(s): Stoioff,Scott C  History of Present Illness: Joanne [name] is a 53 y.o. bed-bound female with a PMH significant for traumatic brain injury, chronic contractures and hemiplegia.  She has a neurogenic bladder and a chronic indwelling Foley catheter complicated by frequent displacement and recurrent UTIs.   She was referred to Dr. Cherylene Corrente who then kindly referred her to me.   [name] presents with an aide from her facility.  I also called and spoke with her guardian, Alene Husk.    Positioning would be a little challenging but definitely possible.  I explained to her and her guardian the purpose and benefits of an SPT over a standard Foley catheter.   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   Traumatic brain injury [facility])     Past Surgical History:  Procedure Laterality Date   APPLICATION OF WOUND VAC N/A 01/19/2023   Procedure: APPLICATION OF WOUND VAC;  Surgeon: Conrado Delay, DO;  Location: [facility];  Service: General;  Laterality: N/A;   INCISION AND DRAINAGE OF WOUND N/A 01/17/2023   Procedure: DEBRIDEMENT Fournier gangrene;  Surgeon: Conrado Delay, DO;  Location: [facility];  Service: General;  Laterality: N/A;   IR  RADIOLOGIST EVAL & MGMT  03/20/2024   IR REPLC GASTRO/COLONIC TUBE PERCUT W/FLUORO  07/11/2021   PEG PLACEMENT N/A 05/27/2020   Procedure: PERCUTANEOUS ENDOSCOPIC GASTROSTOMY (PEG) PLACEMENT;  Surgeon: Marnee Sink, MD;  Location: [facility];  Service: Endoscopy;  Laterality: N/A;   PEG PLACEMENT N/A 01/30/2023   Procedure: PERCUTANEOUS ENDOSCOPIC GASTROSTOMY (PEG) PLACEMENT;  Surgeon: Marnee Sink, MD;  Location: [facility];  Service: Endoscopy;  Laterality: N/A;   PERCUTANEOUS ENDOSCOPIC GASTROSTOMY (PEG) REMOVAL N/A 08/29/2022   Procedure: PERCUTANEOUS ENDOSCOPIC GASTROSTOMY (PEG) REMOVAL;  Surgeon: Marnee Sink, MD;  Location: [facility];  Service: Endoscopy;  Laterality: N/A;   TRACHEOSTOMY     WOUND EXPLORATION N/A 01/19/2023   Procedure: WOUND EXPLORATION;  Surgeon: Conrado Delay, DO;  Location: [facility];  Service: General;  Laterality: N/A;    Allergies: Kaopectate  [attapulgite], Thiopental, Gatifloxacin, Ceftin [cefuroxime], and Tomato  Medications: Prior to Admission medications   Medication Sig Start Date End Date Taking? Authorizing Provider  acetaminophen  (TYLENOL ) 325 MG tablet Take 650 mg by mouth every 8 (eight) hours as needed for mild pain or moderate pain.    [provider]  albuterol  (PROVENTIL ) (2.5 MG/3ML) 0.083% nebulizer solution Take 3 mLs (2.5 mg total) by nebulization every 4 (four) hours as needed for wheezing or shortness of breath. 07/12/21   Krishnan, Gokul, MD  ascorbic acid  (VITAMIN C ) 500 MG tablet Take 1,000 mg by mouth daily.    [provider]  baclofen  (LIORESAL ) 10 MG tablet Take 10 mg by mouth 3 (three) times daily.  07/19/20   [provider]  bisacodyl  (FLEET) 10 MG/30ML ENEM Place 30 mLs (10 mg total) rectally daily as needed (severe constipation). 02/09/23   Alexander, Natalie, DO  carbamazepine  (TEGRETOL  XR) 100 MG 12 hr tablet Take 100 mg by mouth 2 (two) times daily.    [provider]  Cranberry 450 MG CAPS  Take 450 mg by mouth daily.    [provider]  docusate (COLACE) 50 MG/5ML liquid Take 10 mLs (100 mg total) by mouth 2 (two) times daily. 02/09/23   Alexander, Natalie, DO  ferrous sulfate  325 (65 FE) MG tablet Take 325 mg by mouth every other day.    [provider]  gabapentin  (NEURONTIN ) 100 MG capsule Take 100 mg by mouth 2 (two) times daily.    [provider]  lactobacillus acidophilus & bulgar (LACTINEX) chewable tablet Chew 1 tablet by mouth 2 (two) times daily.    [provider]  levothyroxine  (SYNTHROID ) 112 MCG tablet Take 1 tablet (112 mcg total) by mouth daily before breakfast. 07/12/21   Maylene Spear, MD  Mouthwashes (MOUTH RINSE) LIQD solution 15 mLs by Mouth Rinse route as needed (oral care). 02/09/23   Alexander, Natalie, DO  oxyCODONE -acetaminophen  (PERCOCET/ROXICET) 5-325 MG tablet Take 1 tablet by mouth every 8 (eight) hours as needed for severe pain or moderate pain. 02/09/23   Alexander, Natalie, DO  pantoprazole  (PROTONIX ) 40 MG tablet Take 40 mg by mouth. 10/19/12   [provider]  polyethylene glycol (MIRALAX  / GLYCOLAX ) 17 g packet Take 17 g by mouth 2 (two) times daily. 07/12/21   Krishnan, Gokul, MD  senna (SENOKOT) 8.6 MG TABS tablet Take 2 tablets (17.2 mg total) by mouth at bedtime as needed for mild constipation or moderate constipation. 02/09/23   Alexander, Natalie, DO  sertraline  (ZOLOFT ) 25 MG tablet Take 25 mg by mouth daily.    [provider]     Family History  Family history unknown: Yes    Social History   Socioeconomic History   Marital status: Single    Spouse name: Not on file   Number of children: Not on file   Years of education: Not on file   Highest education level: Not on file  Occupational History   Not on file  Tobacco Use   Smoking status: Never   Smokeless tobacco: Never  Substance and Sexual Activity   Alcohol  use: Not Currently   Drug use: Not Currently   Sexual activity:  Not Currently  Other Topics Concern   Not on file  Social History Narrative   Not on file   Social Drivers of Health   Financial Resource Strain: Not on file  Food Insecurity: Not on file  Transportation Needs: Not on file  Physical Activity: Not on file  Stress: Not on file  Social Connections: Unknown (03/03/2022)   Received from [facility]   Social Network    Social Network: Not on file   Review of Systems: A 12 point ROS discussed and pertinent positives are indicated in the HPI above.  All other systems are negative.  Review of Systems  Vital Signs: BP (!) 85/56 (BP Location: Right Arm, Patient Position: Sitting, Cuff Size: Normal)   Pulse 92   Temp 99 F (37.2 C) (Oral)   Resp 16   LMP  (LMP Unknown)   SpO2 96%     Physical Exam Constitutional:      General: She is awake.     Appearance:  She is not ill-appearing or toxic-appearing.  HENT:     Head:     Comments: Healed cranial defect Cardiovascular:     Rate and Rhythm: Normal rate.  Pulmonary:     Effort: Pulmonary effort is normal.  Abdominal:     General: There is no distension.     Palpations: Abdomen is soft.     Tenderness: There is no abdominal tenderness. There is no guarding.  Skin:    General: Skin is warm and dry.  Neurological:     Mental Status: Mental status is at baseline.  Psychiatric:        Behavior: Behavior is cooperative.       Imaging: IR Radiologist Eval & Mgmt Result Date: 03/20/2024 EXAM: NEW PATIENT OFFICE VISIT CHIEF COMPLAINT: SEE NOTE IN EPIC HISTORY OF PRESENT ILLNESS: SEE NOTE IN EPIC REVIEW OF SYSTEMS: SEE NOTE IN EPIC PHYSICAL EXAMINATION: SEE NOTE IN EPIC ASSESSMENT AND PLAN: SEE NOTE IN EPIC Electronically Signed   By: Fernando Hoyer M.D.   On: 03/20/2024 11:57    Labs:  CBC: No results for input(s): "WBC", "HGB", "HCT", "PLT" in the last 8760 hours.  COAGS: No results for input(s): "INR", "APTT" in the last 8760 hours.  BMP: No  results for input(s): "NA", "K", "CL", "CO2", "GLUCOSE", "BUN", "CALCIUM ", "CREATININE", "GFRNONAA", "GFRAA" in the last 8760 hours.  Invalid input(s): "CMP"  LIVER FUNCTION TESTS: No results for input(s): "BILITOT", "AST", "ALT", "ALKPHOS", "PROT", "ALBUMIN " in the last 8760 hours.  TUMOR MARKERS: No results for input(s): "AFPTM", "CEA", "CA199", "CHROMGRNA" in the last 8760 hours.  Assessment and Plan:  53 yo female with hx of prior TBI leaving her bed bound, with contractures and neurogenic bladder requiring chronic foley catheter which has been complicated by recurrent UTI and frequent displacement.   After examining her and reviewing her prior CT images, I do believe we can safely get a suprapubic catheter in place.    I called and spoke to her guardian and answered all of her questions.  I believe an SPT would be in her best interest and her guardian agrees.    1.) Please schedule for SPT placement at [facility]  Electronically Signed: Roxie Cord 03/20/2024, 12:08 PM   I spent a total of  40 Minutes  in face to face in clinical consultation, greater than 50% of which was counseling/coordinating care for chronic urinary retention

## 2024-04-01 ENCOUNTER — Other Ambulatory Visit (HOSPITAL_COMMUNITY): Payer: Self-pay | Admitting: Student

## 2024-04-01 ENCOUNTER — Other Ambulatory Visit: Payer: Self-pay | Admitting: Radiology

## 2024-04-01 DIAGNOSIS — N319 Neuromuscular dysfunction of bladder, unspecified: Secondary | ICD-10-CM

## 2024-04-01 NOTE — H&P (Signed)
 Chief Complaint: Patient was seen in consultation today for neurogenic bladder   Procedure: Suprapubic catheter placement  Referring Physician(s): Dr. Mindi Alto  Supervising Physician: Fernando Hoyer  Patient Status: [facility] - Out-pt  History of Present Illness: Joanne [name] is a 53 y.o. female who is bed bound with a history of traumatic brain injury, chronic contractures, and hemiplegia. She has a neurogenic bladder with chronic indwelling Foley catheter complicated by frequent displacement and recurrent UTI. She was referred to Dr. Cherylene Corrente in Urology who subsequently referred her to IR. She was seen by Dr. Baldemar Lev in clinic who discussed possible placement of suprapubic catheter. After reviewing her imaging and seeing her for evaluation Dr. Marne Sings feels that SPT placement is possible and in her best interest.   She presents today for placement. She is currently resting in bed with her caregiver at the bedside. Patient is in good spirits overall and is without complaints. VSS. NPO since midnight. Procedure discussed with patient's legal guardian Joanne [name]. All questions and concerns answered over the phone.  Code Status: Full Code  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   Traumatic brain injury [facility])     Past Surgical History:  Procedure Laterality Date   APPLICATION OF WOUND  VAC N/A 01/19/2023   Procedure: APPLICATION OF WOUND VAC;  Surgeon: Conrado Delay, DO;  Location: [facility] ORS;  Service: General;  Laterality: N/A;   INCISION AND DRAINAGE OF WOUND N/A 01/17/2023   Procedure: DEBRIDEMENT Fournier gangrene;  Surgeon: Conrado Delay, DO;  Location: [facility] ORS;  Service: General;  Laterality: N/A;   IR RADIOLOGIST EVAL & MGMT  03/20/2024   IR REPLC GASTRO/COLONIC TUBE PERCUT W/FLUORO  07/11/2021   PEG PLACEMENT N/A 05/27/2020   Procedure: PERCUTANEOUS ENDOSCOPIC GASTROSTOMY (PEG) PLACEMENT;  Surgeon: Marnee Sink, MD;  Location: [facility] ENDOSCOPY;  Service: Endoscopy;  Laterality: N/A;   PEG PLACEMENT N/A 01/30/2023   Procedure: PERCUTANEOUS ENDOSCOPIC GASTROSTOMY (PEG) PLACEMENT;  Surgeon: Marnee Sink, MD;  Location: [facility] ENDOSCOPY;  Service: Endoscopy;  Laterality: N/A;   PERCUTANEOUS ENDOSCOPIC GASTROSTOMY (PEG) REMOVAL N/A 08/29/2022   Procedure: PERCUTANEOUS ENDOSCOPIC GASTROSTOMY (PEG) REMOVAL;  Surgeon: Marnee Sink, MD;  Location: [facility] ENDOSCOPY;  Service: Endoscopy;  Laterality: N/A;   TRACHEOSTOMY     WOUND EXPLORATION N/A 01/19/2023   Procedure: WOUND EXPLORATION;  Surgeon: Conrado Delay, DO;  Location: [facility] ORS;  Service: General;  Laterality: N/A;    Allergies: Kaopectate  [attapulgite], Thiopental, Gatifloxacin, Ceftin [cefuroxime], and Tomato  Medications: Prior to Admission medications   Medication Sig Start Date End Date Taking? Authorizing Provider  acetaminophen  (TYLENOL ) 325 MG tablet Take 650 mg by mouth every 8 (eight) hours as needed for mild pain or moderate pain.    [provider]  albuterol  (PROVENTIL ) (2.5 MG/3ML) 0.083% nebulizer solution Take 3 mLs (2.5 mg total) by nebulization every 4 (four) hours as needed for wheezing or shortness of  breath. 07/12/21   Krishnan, Gokul, MD  ascorbic acid  (VITAMIN C ) 500 MG tablet Take 1,000 mg by mouth daily.    [provider]  baclofen  (LIORESAL ) 10 MG tablet Take 10 mg by mouth 3 (three) times  daily. 07/19/20   [provider]  bisacodyl  (FLEET) 10 MG/30ML ENEM Place 30 mLs (10 mg total) rectally daily as needed (severe constipation). 02/09/23   Alexander, Natalie, DO  carbamazepine  (TEGRETOL  XR) 100 MG 12 hr tablet Take 100 mg by mouth 2 (two) times daily.    [provider]  Cranberry 450 MG CAPS Take 450 mg by mouth daily.    [provider]  docusate (COLACE) 50 MG/5ML liquid Take 10 mLs (100 mg total) by mouth 2 (two) times daily. 02/09/23   Alexander, Natalie, DO  ferrous sulfate  325 (65 FE) MG tablet Take 325 mg by mouth every other day.    [provider]  gabapentin  (NEURONTIN ) 100 MG capsule Take 100 mg by mouth 2 (two) times daily.    [provider]  lactobacillus acidophilus & bulgar (LACTINEX) chewable tablet Chew 1 tablet by mouth 2 (two) times daily.    [provider]  levothyroxine  (SYNTHROID ) 112 MCG tablet Take 1 tablet (112 mcg total) by mouth daily before breakfast. 07/12/21   Maylene Spear, MD  Mouthwashes (MOUTH RINSE) LIQD solution 15 mLs by Mouth Rinse route as needed (oral care). 02/09/23   Alexander, Natalie, DO  oxyCODONE -acetaminophen  (PERCOCET/ROXICET) 5-325 MG tablet Take 1 tablet by mouth every 8 (eight) hours as needed for severe pain or moderate pain. 02/09/23   Alexander, Natalie, DO  pantoprazole  (PROTONIX ) 40 MG tablet Take 40 mg by mouth. 10/19/12   [provider]  polyethylene glycol (MIRALAX  / GLYCOLAX ) 17 g packet Take 17 g by mouth 2 (two) times daily. 07/12/21   Krishnan, Gokul, MD  senna (SENOKOT) 8.6 MG TABS tablet Take 2 tablets (17.2 mg total) by mouth at bedtime as needed for mild constipation or moderate constipation. 02/09/23   Alexander, Natalie, DO  sertraline  (ZOLOFT ) 25 MG tablet Take 25 mg by mouth daily.    [provider]     Family History  Family history unknown: Yes    Social History   Socioeconomic History   Marital status: Single    Spouse name: Not on  file   Number of children: Not on file   Years of education: Not on file   Highest education level: Not on file  Occupational History   Not on file  Tobacco Use   Smoking status: Never   Smokeless tobacco: Never  Substance and Sexual Activity   Alcohol  use: Not Currently   Drug use: Not Currently   Sexual activity: Not Currently  Other Topics Concern   Not on file  Social History Narrative   Not on file   Social Drivers of Health   Financial Resource Strain: Not on file  Food Insecurity: Not on file  Transportation Needs: Not on file  Physical Activity: Not on file  Stress: Not on file  Social Connections: Unknown (03/03/2022)   Received from [facility]   Social Network    Social Network: Not on file    Review of Systems Denies any N/V, chest pain, shortness of breath, fevers/chills. All other ROS negative.  Vital Signs: LMP  (LMP Unknown)     Physical Exam Vitals reviewed.  HENT:     Head: Normocephalic and atraumatic.     Mouth/Throat:  Mouth: Mucous membranes are moist.     Pharynx: Oropharynx is clear.  Cardiovascular:     Rate and Rhythm: Normal rate and regular rhythm.  Pulmonary:     Effort: Pulmonary effort is normal.     Breath sounds: Normal breath sounds.  Abdominal:     General: Abdomen is flat.     Palpations: Abdomen is soft.     Tenderness: There is no abdominal tenderness.  Genitourinary:    Comments: Foley catheter in place and clamped Musculoskeletal:        General: Normal range of motion.     Cervical back: Normal range of motion.  Skin:    General: Skin is warm and dry.  Neurological:     Mental Status: She is alert. Mental status is at baseline.  Psychiatric:        Mood and Affect: Mood normal.     Imaging: IR Radiologist Eval & Mgmt Result Date: 03/20/2024 EXAM: NEW PATIENT OFFICE VISIT CHIEF COMPLAINT: SEE NOTE IN EPIC HISTORY OF PRESENT ILLNESS: SEE NOTE IN EPIC REVIEW OF SYSTEMS: SEE NOTE IN EPIC  PHYSICAL EXAMINATION: SEE NOTE IN EPIC ASSESSMENT AND PLAN: SEE NOTE IN EPIC Electronically Signed   By: Fernando Hoyer M.D.   On: 03/20/2024 11:57    Labs:  CBC: No results for input(s): WBC, HGB, HCT, PLT in the last 8760 hours.  COAGS: No results for input(s): INR, APTT in the last 8760 hours.  BMP: No results for input(s): NA, K, CL, CO2, GLUCOSE, BUN, CALCIUM , CREATININE, GFRNONAA, GFRAA in the last 8760 hours.  Invalid input(s): CMP  LIVER FUNCTION TESTS: No results for input(s): BILITOT, AST, ALT, ALKPHOS, PROT, ALBUMIN  in the last 8760 hours.  TUMOR MARKERS: No results for input(s): AFPTM, CEA, CA199, CHROMGRNA in the last 8760 hours.  Assessment and Plan:  Neurogenic bladder in the setting of TBI: IVY PURYEAR is a 53 y.o. female with a history of traumatic brain injury and chronic urinary retention who presents to [facility] Interventional Radiology department for an image-guided suprapubic catheter placement with Dr. Baldemar Lev on 04/02/24. Procedure to be performed under moderate sedation.  Risks and benefits of suprapubic catheter placement were discussed with the patient's legal guardian including bleeding, infection, damage to adjacent structures, bladder perforation/fistula connection, and sepsis.  All of the legal guardian's questions were answered, she is agreeable to proceed. Consent signed and in chart.   Thank you for this interesting consult. I greatly enjoyed meeting Joanne [name] and look forward to participating in their care. A copy of this report was sent to the requesting provider on this date.  Electronically Signed: Saysha Menta M Tasmine Hipwell, PA-C 04/01/2024, 2:53 PM   I spent a total of  30 Minutes in face to face clinical consultation, greater than 50% of which was counseling/coordinating care for suprapubic catheter placement.

## 2024-04-01 NOTE — Progress Notes (Signed)
 Patient for IR SPT Placement on Wed 04/02/24, I called and spoke with Cindy Creed at [facility] on the phone and gave pre-procedure instructions. Cindy Creed was made aware to have the patient here at 10:30a, NPO after MN prior to procedure as well as driver post procedure/recovery/discharge. Cindy Creed stated understanding.  Called 04/01/24

## 2024-04-02 ENCOUNTER — Other Ambulatory Visit: Payer: Self-pay | Admitting: Interventional Radiology

## 2024-04-02 ENCOUNTER — Other Ambulatory Visit: Payer: Self-pay

## 2024-04-02 ENCOUNTER — Encounter: Payer: Self-pay | Admitting: Radiology

## 2024-04-02 ENCOUNTER — Ambulatory Visit
Admission: RE | Admit: 2024-04-02 | Discharge: 2024-04-02 | Disposition: A | Discharge status: Home / Self Care | Source: Ambulatory Visit | Attending: Interventional Radiology | Admitting: Interventional Radiology

## 2024-04-02 DIAGNOSIS — N319 Neuromuscular dysfunction of bladder, unspecified: Secondary | ICD-10-CM | POA: Diagnosis not present

## 2024-04-02 DIAGNOSIS — Z978 Presence of other specified devices: Secondary | ICD-10-CM

## 2024-04-02 DIAGNOSIS — G8190 Hemiplegia, unspecified affecting unspecified side: Secondary | ICD-10-CM | POA: Diagnosis not present

## 2024-04-02 DIAGNOSIS — Z87820 Personal history of traumatic brain injury: Secondary | ICD-10-CM | POA: Insufficient documentation

## 2024-04-02 DIAGNOSIS — N99518 Other cystostomy complication: Secondary | ICD-10-CM | POA: Diagnosis not present

## 2024-04-02 DIAGNOSIS — Z87440 Personal history of urinary (tract) infections: Secondary | ICD-10-CM

## 2024-04-02 LAB — GLUCOSE, CAPILLARY: Glucose-Capillary: 78 mg/dL (ref 70–99)

## 2024-04-02 MED ORDER — MIDAZOLAM HCL 2 MG/2ML IJ SOLN
INTRAMUSCULAR | Status: AC
Start: 2024-04-02 — End: 2024-04-02
  Filled 2024-04-02: qty 2

## 2024-04-02 MED ORDER — SODIUM CHLORIDE 0.9 % IV SOLN: INTRAVENOUS | Status: DC

## 2024-04-02 MED ORDER — LIDOCAINE HCL 1 % IJ SOLN: 10.0000 mL | Freq: Once | INTRAMUSCULAR | Status: DC

## 2024-04-02 MED ORDER — IOHEXOL 300 MG/ML  SOLN: 50.0000 mL | Freq: Once | INTRAMUSCULAR | Status: DC | PRN

## 2024-04-02 MED ORDER — FENTANYL CITRATE (PF) 100 MCG/2ML IJ SOLN
INTRAMUSCULAR | Status: AC
Start: 2024-04-02 — End: 2024-04-02
  Filled 2024-04-02: qty 2

## 2024-04-02 MED ORDER — LIDOCAINE HCL 1 % IJ SOLN
INTRAMUSCULAR | Status: AC
  Filled 2024-04-02: qty 20

## 2024-04-02 MED ORDER — FENTANYL CITRATE (PF) 100 MCG/2ML IJ SOLN
INTRAMUSCULAR | Status: AC | PRN
  Administered 2024-04-02: 50 ug via INTRAVENOUS

## 2024-04-02 MED ORDER — MIDAZOLAM HCL 2 MG/2ML IJ SOLN
INTRAMUSCULAR | Status: AC | PRN
  Administered 2024-04-02: 1 mg via INTRAVENOUS

## 2024-04-02 NOTE — Procedures (Signed)
 Interventional Radiology Procedure Note  Procedure: Attempted but unsuccessful SPT placement.  Bladder could not be distended.   Complications: None  Estimated Blood Loss: None  Recommendations: DC home  Signed,  Roxie Cord, MD

## 2024-04-02 NOTE — Progress Notes (Signed)
 Pt. Arrived from [facility] center with Nurse asistent. Pt. In wheelchair; utilized hoyer lift to get pt. In stretcher. Pt. Pleasant, cooperative; head stricture toward right side/ .

## 2024-04-02 NOTE — Progress Notes (Signed)
 Patient clinically stable post IR/CT SPT attempt per Dr Marne Sings, despite efforts, with 2 new foley catheters placed, however with fluid instilled, foleys falling out. Unable to place SPT secondarily to anatomy. Received Versed  1 mg along with Fentanyl  50 mcg IV for procedure. Report given to Shireen Dory RN post procedure./specials/09.
